# Patient Record
Sex: Female | Born: 1960
Health system: Southern US, Community
[De-identification: ages and names within clinical notes are randomized; demographics above are authoritative.]

## PROBLEM LIST (undated history)

## (undated) DIAGNOSIS — N809 Endometriosis, unspecified: Secondary | ICD-10-CM

## (undated) DIAGNOSIS — R569 Unspecified convulsions: Secondary | ICD-10-CM

## (undated) DIAGNOSIS — Z860101 Personal history of adenomatous and serrated colon polyps: Secondary | ICD-10-CM

## (undated) DIAGNOSIS — E7800 Pure hypercholesterolemia, unspecified: Secondary | ICD-10-CM

## (undated) DIAGNOSIS — E663 Overweight: Secondary | ICD-10-CM

## (undated) DIAGNOSIS — R011 Cardiac murmur, unspecified: Secondary | ICD-10-CM

## (undated) DIAGNOSIS — E039 Hypothyroidism, unspecified: Secondary | ICD-10-CM

## (undated) DIAGNOSIS — G2581 Restless legs syndrome: Secondary | ICD-10-CM

## (undated) DIAGNOSIS — IMO0001 Reserved for inherently not codable concepts without codable children: Secondary | ICD-10-CM

## (undated) DIAGNOSIS — E119 Type 2 diabetes mellitus without complications: Secondary | ICD-10-CM

## (undated) DIAGNOSIS — K589 Irritable bowel syndrome without diarrhea: Secondary | ICD-10-CM

## (undated) DIAGNOSIS — J45909 Unspecified asthma, uncomplicated: Secondary | ICD-10-CM

## (undated) DIAGNOSIS — I639 Cerebral infarction, unspecified: Secondary | ICD-10-CM

## (undated) DIAGNOSIS — Z86010 Personal history of colonic polyps: Secondary | ICD-10-CM

## (undated) DIAGNOSIS — T7840XA Allergy, unspecified, initial encounter: Secondary | ICD-10-CM

## (undated) DIAGNOSIS — F0390 Unspecified dementia without behavioral disturbance: Secondary | ICD-10-CM

## (undated) DIAGNOSIS — D649 Anemia, unspecified: Secondary | ICD-10-CM

## (undated) DIAGNOSIS — I059 Rheumatic mitral valve disease, unspecified: Secondary | ICD-10-CM

## (undated) DIAGNOSIS — F411 Generalized anxiety disorder: Secondary | ICD-10-CM

## (undated) DIAGNOSIS — K219 Gastro-esophageal reflux disease without esophagitis: Secondary | ICD-10-CM

## (undated) DIAGNOSIS — M26629 Arthralgia of temporomandibular joint, unspecified side: Secondary | ICD-10-CM

## (undated) DIAGNOSIS — G4761 Periodic limb movement disorder: Secondary | ICD-10-CM

## (undated) DIAGNOSIS — F3289 Other specified depressive episodes: Secondary | ICD-10-CM

## (undated) DIAGNOSIS — F449 Dissociative and conversion disorder, unspecified: Secondary | ICD-10-CM

## (undated) DIAGNOSIS — R51 Headache: Secondary | ICD-10-CM

## (undated) DIAGNOSIS — F329 Major depressive disorder, single episode, unspecified: Secondary | ICD-10-CM

## (undated) DIAGNOSIS — M1990 Unspecified osteoarthritis, unspecified site: Secondary | ICD-10-CM

## (undated) DIAGNOSIS — R1310 Dysphagia, unspecified: Secondary | ICD-10-CM

## (undated) DIAGNOSIS — M797 Fibromyalgia: Secondary | ICD-10-CM

## (undated) HISTORY — PX: ABDOMINAL HYSTERECTOMY: SHX81

## (undated) HISTORY — PX: LIPOMA EXCISION: SHX5283

## (undated) HISTORY — DX: Cardiac murmur, unspecified: R01.1

## (undated) HISTORY — DX: Endometriosis, unspecified: N80.9

## (undated) HISTORY — PX: WRIST SURGERY: SHX841

## (undated) HISTORY — DX: Periodic limb movement disorder: G47.61

## (undated) HISTORY — PX: TONSILLECTOMY: SUR1361

## (undated) HISTORY — DX: Anemia, unspecified: D64.9

## (undated) HISTORY — DX: Restless legs syndrome: G25.81

## (undated) HISTORY — DX: Allergy, unspecified, initial encounter: T78.40XA

## (undated) HISTORY — DX: Pure hypercholesterolemia, unspecified: E78.00

## (undated) HISTORY — DX: Dissociative and conversion disorder, unspecified: F44.9

## (undated) HISTORY — DX: Headache: R51

## (undated) HISTORY — PX: FOOT SURGERY: SHX648

## (undated) HISTORY — DX: Personal history of adenomatous and serrated colon polyps: Z86.0101

## (undated) HISTORY — DX: Reserved for inherently not codable concepts without codable children: IMO0001

## (undated) HISTORY — PX: OTHER SURGICAL HISTORY: SHX169

## (undated) HISTORY — DX: Rheumatic mitral valve disease, unspecified: I05.9

## (undated) HISTORY — PX: POLYPECTOMY: SHX149

## (undated) HISTORY — DX: Generalized anxiety disorder: F41.1

## (undated) HISTORY — DX: Unspecified dementia, unspecified severity, without behavioral disturbance, psychotic disturbance, mood disturbance, and anxiety: F03.90

## (undated) HISTORY — DX: Overweight: E66.3

## (undated) HISTORY — DX: Cerebral infarction, unspecified: I63.9

## (undated) HISTORY — DX: Fibromyalgia: M79.7

## (undated) HISTORY — DX: Personal history of colonic polyps: Z86.010

## (undated) HISTORY — DX: Unspecified osteoarthritis, unspecified site: M19.90

## (undated) HISTORY — DX: Dysphagia, unspecified: R13.10

## (undated) HISTORY — DX: Other specified depressive episodes: F32.89

## (undated) HISTORY — DX: Irritable bowel syndrome, unspecified: K58.9

## (undated) HISTORY — DX: Gastro-esophageal reflux disease without esophagitis: K21.9

## (undated) HISTORY — DX: Major depressive disorder, single episode, unspecified: F32.9

## (undated) HISTORY — PX: TOTAL ABDOMINAL HYSTERECTOMY: SHX209

## (undated) HISTORY — DX: Arthralgia of temporomandibular joint, unspecified side: M26.629

## (undated) HISTORY — DX: Type 2 diabetes mellitus without complications: E11.9

## (undated) HISTORY — DX: Unspecified asthma, uncomplicated: J45.909

## (undated) HISTORY — DX: Hypothyroidism, unspecified: E03.9

## (undated) HISTORY — DX: Unspecified convulsions: R56.9

---

## 1997-05-19 ENCOUNTER — Other Ambulatory Visit: Admission: RE | Admit: 1997-05-19 | Discharge: 1997-05-19 | Payer: Self-pay | Admitting: *Deleted

## 1997-06-30 ENCOUNTER — Ambulatory Visit (HOSPITAL_BASED_OUTPATIENT_CLINIC_OR_DEPARTMENT_OTHER): Admission: RE | Admit: 1997-06-30 | Discharge: 1997-06-30 | Payer: Self-pay | Admitting: Orthopedic Surgery

## 1997-07-09 ENCOUNTER — Emergency Department (HOSPITAL_COMMUNITY): Admission: EM | Admit: 1997-07-09 | Discharge: 1997-07-09 | Payer: Self-pay | Admitting: Emergency Medicine

## 1997-12-25 ENCOUNTER — Ambulatory Visit (HOSPITAL_COMMUNITY): Admission: RE | Admit: 1997-12-25 | Discharge: 1997-12-25 | Payer: Self-pay | Admitting: General Surgery

## 1998-01-05 ENCOUNTER — Encounter: Payer: Self-pay | Admitting: General Surgery

## 1998-01-05 ENCOUNTER — Ambulatory Visit (HOSPITAL_COMMUNITY): Admission: RE | Admit: 1998-01-05 | Discharge: 1998-01-05 | Payer: Self-pay | Admitting: General Surgery

## 1998-06-24 ENCOUNTER — Other Ambulatory Visit: Admission: RE | Admit: 1998-06-24 | Discharge: 1998-06-24 | Payer: Self-pay | Admitting: *Deleted

## 1999-07-07 ENCOUNTER — Other Ambulatory Visit: Admission: RE | Admit: 1999-07-07 | Discharge: 1999-07-07 | Payer: Self-pay | Admitting: *Deleted

## 1999-09-28 ENCOUNTER — Encounter: Admission: RE | Admit: 1999-09-28 | Discharge: 1999-09-28 | Payer: Self-pay | Admitting: Gastroenterology

## 1999-09-28 ENCOUNTER — Encounter: Payer: Self-pay | Admitting: Gastroenterology

## 1999-10-07 ENCOUNTER — Other Ambulatory Visit: Admission: RE | Admit: 1999-10-07 | Discharge: 1999-10-07 | Payer: Self-pay | Admitting: *Deleted

## 1999-10-07 ENCOUNTER — Encounter (INDEPENDENT_AMBULATORY_CARE_PROVIDER_SITE_OTHER): Payer: Self-pay | Admitting: Specialist

## 2000-05-21 ENCOUNTER — Encounter: Payer: Self-pay | Admitting: Gastroenterology

## 2000-05-21 ENCOUNTER — Ambulatory Visit (HOSPITAL_COMMUNITY): Admission: RE | Admit: 2000-05-21 | Discharge: 2000-05-21 | Payer: Self-pay | Admitting: Gastroenterology

## 2000-06-07 ENCOUNTER — Encounter: Payer: Self-pay | Admitting: *Deleted

## 2000-06-07 ENCOUNTER — Observation Stay (HOSPITAL_COMMUNITY): Admission: RE | Admit: 2000-06-07 | Discharge: 2000-06-08 | Payer: Self-pay | Admitting: *Deleted

## 2000-06-07 ENCOUNTER — Encounter (INDEPENDENT_AMBULATORY_CARE_PROVIDER_SITE_OTHER): Payer: Self-pay | Admitting: Specialist

## 2002-02-28 ENCOUNTER — Ambulatory Visit (HOSPITAL_BASED_OUTPATIENT_CLINIC_OR_DEPARTMENT_OTHER): Admission: RE | Admit: 2002-02-28 | Discharge: 2002-02-28 | Payer: Self-pay | Admitting: Pulmonary Disease

## 2002-11-12 ENCOUNTER — Encounter: Admission: RE | Admit: 2002-11-12 | Discharge: 2002-11-12 | Payer: Self-pay | Admitting: *Deleted

## 2002-11-12 ENCOUNTER — Encounter: Payer: Self-pay | Admitting: *Deleted

## 2003-10-22 ENCOUNTER — Other Ambulatory Visit: Admission: RE | Admit: 2003-10-22 | Discharge: 2003-10-22 | Payer: Self-pay | Admitting: *Deleted

## 2004-01-27 ENCOUNTER — Ambulatory Visit: Payer: Self-pay | Admitting: Adult Health

## 2004-04-04 ENCOUNTER — Emergency Department (HOSPITAL_COMMUNITY): Admission: EM | Admit: 2004-04-04 | Discharge: 2004-04-04 | Payer: Self-pay | Admitting: Emergency Medicine

## 2004-09-06 ENCOUNTER — Emergency Department (HOSPITAL_COMMUNITY): Admission: EM | Admit: 2004-09-06 | Discharge: 2004-09-06 | Payer: Self-pay | Admitting: Family Medicine

## 2004-09-06 ENCOUNTER — Ambulatory Visit: Payer: Self-pay | Admitting: Pulmonary Disease

## 2004-09-21 ENCOUNTER — Ambulatory Visit: Payer: Self-pay | Admitting: Gastroenterology

## 2004-09-22 ENCOUNTER — Ambulatory Visit: Payer: Self-pay | Admitting: Internal Medicine

## 2005-01-13 ENCOUNTER — Ambulatory Visit: Payer: Self-pay | Admitting: Critical Care Medicine

## 2005-01-16 ENCOUNTER — Ambulatory Visit: Payer: Self-pay | Admitting: Pulmonary Disease

## 2005-07-17 ENCOUNTER — Ambulatory Visit: Payer: Self-pay | Admitting: Pulmonary Disease

## 2006-01-08 ENCOUNTER — Ambulatory Visit: Payer: Self-pay | Admitting: Pulmonary Disease

## 2006-11-22 DIAGNOSIS — K219 Gastro-esophageal reflux disease without esophagitis: Secondary | ICD-10-CM | POA: Insufficient documentation

## 2006-11-22 DIAGNOSIS — F418 Other specified anxiety disorders: Secondary | ICD-10-CM | POA: Insufficient documentation

## 2006-11-22 DIAGNOSIS — K589 Irritable bowel syndrome without diarrhea: Secondary | ICD-10-CM | POA: Insufficient documentation

## 2006-11-22 DIAGNOSIS — IMO0001 Reserved for inherently not codable concepts without codable children: Secondary | ICD-10-CM | POA: Insufficient documentation

## 2006-11-22 DIAGNOSIS — E039 Hypothyroidism, unspecified: Secondary | ICD-10-CM | POA: Insufficient documentation

## 2006-11-22 DIAGNOSIS — K7581 Nonalcoholic steatohepatitis (NASH): Secondary | ICD-10-CM | POA: Insufficient documentation

## 2006-11-22 DIAGNOSIS — K760 Fatty (change of) liver, not elsewhere classified: Secondary | ICD-10-CM

## 2007-01-02 ENCOUNTER — Telehealth (INDEPENDENT_AMBULATORY_CARE_PROVIDER_SITE_OTHER): Payer: Self-pay | Admitting: *Deleted

## 2007-01-08 ENCOUNTER — Ambulatory Visit: Payer: Self-pay | Admitting: Pulmonary Disease

## 2007-01-08 LAB — CONVERTED CEMR LAB
ALT: 39 units/L — ABNORMAL HIGH (ref 0–35)
AST: 29 units/L (ref 0–37)
Albumin: 3.9 g/dL (ref 3.5–5.2)
Alkaline Phosphatase: 65 units/L (ref 39–117)
BUN: 5 mg/dL — ABNORMAL LOW (ref 6–23)
Basophils Absolute: 0 10*3/uL (ref 0.0–0.1)
Basophils Relative: 0.5 % (ref 0.0–1.0)
Bilirubin, Direct: 0.1 mg/dL (ref 0.0–0.3)
CO2: 29 meq/L (ref 19–32)
Calcium: 9.4 mg/dL (ref 8.4–10.5)
Chloride: 100 meq/L (ref 96–112)
Creatinine, Ser: 1 mg/dL (ref 0.4–1.2)
Eosinophils Absolute: 0.1 10*3/uL (ref 0.0–0.6)
Eosinophils Relative: 0.8 % (ref 0.0–5.0)
GFR calc Af Amer: 76 mL/min
GFR calc non Af Amer: 63 mL/min
Glucose, Bld: 97 mg/dL (ref 70–99)
HCT: 40.8 % (ref 36.0–46.0)
Hemoglobin: 14.5 g/dL (ref 12.0–15.0)
Lymphocytes Relative: 34.7 % (ref 12.0–46.0)
MCHC: 35.7 g/dL (ref 30.0–36.0)
MCV: 95.6 fL (ref 78.0–100.0)
Monocytes Absolute: 0.3 10*3/uL (ref 0.2–0.7)
Monocytes Relative: 3.5 % (ref 3.0–11.0)
Neutro Abs: 5.8 10*3/uL (ref 1.4–7.7)
Neutrophils Relative %: 60.5 % (ref 43.0–77.0)
Platelets: 239 10*3/uL (ref 150–400)
Potassium: 3.9 meq/L (ref 3.5–5.1)
RBC: 4.26 M/uL (ref 3.87–5.11)
RDW: 12.5 % (ref 11.5–14.6)
Sed Rate: 16 mm/hr (ref 0–25)
Sodium: 138 meq/L (ref 135–145)
TSH: 31.32 microintl units/mL — ABNORMAL HIGH (ref 0.35–5.50)
Total Bilirubin: 0.5 mg/dL (ref 0.3–1.2)
Total Protein: 7.1 g/dL (ref 6.0–8.3)
WBC: 9.5 10*3/uL (ref 4.5–10.5)

## 2007-02-04 ENCOUNTER — Ambulatory Visit: Payer: Self-pay | Admitting: Pulmonary Disease

## 2007-02-14 ENCOUNTER — Telehealth (INDEPENDENT_AMBULATORY_CARE_PROVIDER_SITE_OTHER): Payer: Self-pay | Admitting: *Deleted

## 2007-03-13 ENCOUNTER — Ambulatory Visit: Payer: Self-pay | Admitting: Pulmonary Disease

## 2007-03-13 LAB — CONVERTED CEMR LAB
Free T4: 0.5 ng/dL — ABNORMAL LOW (ref 0.6–1.6)
T3, Free: 2.2 pg/mL — ABNORMAL LOW (ref 2.3–4.2)
TSH: 20.94 microintl units/mL — ABNORMAL HIGH (ref 0.35–5.50)

## 2007-04-17 ENCOUNTER — Encounter: Payer: Self-pay | Admitting: Pulmonary Disease

## 2007-04-25 ENCOUNTER — Telehealth (INDEPENDENT_AMBULATORY_CARE_PROVIDER_SITE_OTHER): Payer: Self-pay | Admitting: *Deleted

## 2007-07-10 ENCOUNTER — Telehealth (INDEPENDENT_AMBULATORY_CARE_PROVIDER_SITE_OTHER): Payer: Self-pay | Admitting: *Deleted

## 2007-07-11 ENCOUNTER — Ambulatory Visit: Payer: Self-pay | Admitting: Internal Medicine

## 2007-07-19 LAB — CONVERTED CEMR LAB: TSH: 7.63 microintl units/mL — ABNORMAL HIGH (ref 0.35–5.50)

## 2007-08-05 ENCOUNTER — Telehealth (INDEPENDENT_AMBULATORY_CARE_PROVIDER_SITE_OTHER): Payer: Self-pay | Admitting: *Deleted

## 2007-08-20 ENCOUNTER — Telehealth: Payer: Self-pay | Admitting: Pulmonary Disease

## 2007-09-04 ENCOUNTER — Ambulatory Visit: Payer: Self-pay | Admitting: Pulmonary Disease

## 2007-09-04 DIAGNOSIS — R1310 Dysphagia, unspecified: Secondary | ICD-10-CM | POA: Insufficient documentation

## 2007-09-04 DIAGNOSIS — F17200 Nicotine dependence, unspecified, uncomplicated: Secondary | ICD-10-CM | POA: Insufficient documentation

## 2007-09-04 LAB — CONVERTED CEMR LAB: Vit D, 1,25-Dihydroxy: 66 (ref 30–89)

## 2007-09-08 DIAGNOSIS — M26629 Arthralgia of temporomandibular joint, unspecified side: Secondary | ICD-10-CM | POA: Insufficient documentation

## 2007-09-08 DIAGNOSIS — R519 Headache, unspecified: Secondary | ICD-10-CM | POA: Insufficient documentation

## 2007-09-08 DIAGNOSIS — G4761 Periodic limb movement disorder: Secondary | ICD-10-CM | POA: Insufficient documentation

## 2007-09-08 DIAGNOSIS — R51 Headache: Secondary | ICD-10-CM | POA: Insufficient documentation

## 2007-09-08 DIAGNOSIS — F411 Generalized anxiety disorder: Secondary | ICD-10-CM | POA: Insufficient documentation

## 2007-09-08 DIAGNOSIS — E785 Hyperlipidemia, unspecified: Secondary | ICD-10-CM | POA: Insufficient documentation

## 2007-09-09 LAB — CONVERTED CEMR LAB
ALT: 65 units/L — ABNORMAL HIGH (ref 0–35)
AST: 45 units/L — ABNORMAL HIGH (ref 0–37)
Albumin: 4.2 g/dL (ref 3.5–5.2)
Alkaline Phosphatase: 70 units/L (ref 39–117)
BUN: 9 mg/dL (ref 6–23)
Basophils Absolute: 0 10*3/uL (ref 0.0–0.1)
Basophils Relative: 0.6 % (ref 0.0–3.0)
Bilirubin, Direct: 0.1 mg/dL (ref 0.0–0.3)
CO2: 29 meq/L (ref 19–32)
Calcium: 9.6 mg/dL (ref 8.4–10.5)
Chloride: 101 meq/L (ref 96–112)
Cholesterol: 301 mg/dL (ref 0–200)
Creatinine, Ser: 0.8 mg/dL (ref 0.4–1.2)
Direct LDL: 211.1 mg/dL
Eosinophils Absolute: 0 10*3/uL (ref 0.0–0.7)
Eosinophils Relative: 0.3 % (ref 0.0–5.0)
GFR calc Af Amer: 98 mL/min
GFR calc non Af Amer: 81 mL/min
Glucose, Bld: 92 mg/dL (ref 70–99)
HCT: 40.9 % (ref 36.0–46.0)
HDL: 32.4 mg/dL — ABNORMAL LOW (ref >39.0)
Hemoglobin: 14.4 g/dL (ref 12.0–15.0)
Lymphocytes Relative: 38.5 % (ref 12.0–46.0)
MCHC: 35.2 g/dL (ref 30.0–36.0)
MCV: 94.6 fL (ref 78.0–100.0)
Monocytes Absolute: 0.4 10*3/uL (ref 0.1–1.0)
Monocytes Relative: 5.1 % (ref 3.0–12.0)
Neutro Abs: 4.3 10*3/uL (ref 1.4–7.7)
Neutrophils Relative %: 55.5 % (ref 43.0–77.0)
Platelets: 224 10*3/uL (ref 150–400)
Potassium: 4.4 meq/L (ref 3.5–5.1)
RBC: 4.32 M/uL (ref 3.87–5.11)
RDW: 11.9 % (ref 11.5–14.6)
Sed Rate: 16 mm/hr (ref 0–22)
Sodium: 138 meq/L (ref 135–145)
TSH: 2.24 microintl units/mL (ref 0.35–5.50)
Total Bilirubin: 0.8 mg/dL (ref 0.3–1.2)
Total CHOL/HDL Ratio: 9.3
Total Protein: 7.2 g/dL (ref 6.0–8.3)
Triglycerides: 284 mg/dL (ref 0–149)
VLDL: 57 mg/dL — ABNORMAL HIGH (ref 0–40)
WBC: 7.7 10*3/uL (ref 4.5–10.5)

## 2007-09-12 ENCOUNTER — Ambulatory Visit: Payer: Self-pay | Admitting: Internal Medicine

## 2007-10-10 ENCOUNTER — Ambulatory Visit: Payer: Self-pay | Admitting: Internal Medicine

## 2007-10-17 ENCOUNTER — Emergency Department (HOSPITAL_COMMUNITY): Admission: EM | Admit: 2007-10-17 | Discharge: 2007-10-17 | Payer: Self-pay | Admitting: Family Medicine

## 2007-10-23 ENCOUNTER — Telehealth: Payer: Self-pay | Admitting: Pulmonary Disease

## 2007-10-23 DIAGNOSIS — M79609 Pain in unspecified limb: Secondary | ICD-10-CM | POA: Insufficient documentation

## 2007-11-11 ENCOUNTER — Ambulatory Visit: Payer: Self-pay | Admitting: Pulmonary Disease

## 2007-11-12 ENCOUNTER — Telehealth (INDEPENDENT_AMBULATORY_CARE_PROVIDER_SITE_OTHER): Payer: Self-pay | Admitting: *Deleted

## 2007-11-27 ENCOUNTER — Encounter: Admission: RE | Admit: 2007-11-27 | Discharge: 2007-11-27 | Payer: Self-pay | Admitting: Pulmonary Disease

## 2008-01-15 ENCOUNTER — Telehealth (INDEPENDENT_AMBULATORY_CARE_PROVIDER_SITE_OTHER): Payer: Self-pay | Admitting: *Deleted

## 2008-01-27 ENCOUNTER — Telehealth: Payer: Self-pay | Admitting: Adult Health

## 2008-01-28 ENCOUNTER — Ambulatory Visit: Payer: Self-pay | Admitting: Pulmonary Disease

## 2008-01-29 ENCOUNTER — Telehealth (INDEPENDENT_AMBULATORY_CARE_PROVIDER_SITE_OTHER): Payer: Self-pay | Admitting: *Deleted

## 2008-02-07 HISTORY — PX: UPPER GASTROINTESTINAL ENDOSCOPY: SHX188

## 2008-02-14 ENCOUNTER — Telehealth: Payer: Self-pay | Admitting: Pulmonary Disease

## 2008-03-12 ENCOUNTER — Telehealth: Payer: Self-pay | Admitting: Pulmonary Disease

## 2008-03-19 ENCOUNTER — Telehealth (INDEPENDENT_AMBULATORY_CARE_PROVIDER_SITE_OTHER): Payer: Self-pay | Admitting: *Deleted

## 2008-04-06 ENCOUNTER — Ambulatory Visit: Payer: Self-pay | Admitting: Internal Medicine

## 2008-04-06 DIAGNOSIS — K625 Hemorrhage of anus and rectum: Secondary | ICD-10-CM | POA: Insufficient documentation

## 2008-04-06 DIAGNOSIS — R197 Diarrhea, unspecified: Secondary | ICD-10-CM | POA: Insufficient documentation

## 2008-04-06 LAB — CONVERTED CEMR LAB
ALT: 64 units/L — ABNORMAL HIGH (ref 0–35)
AST: 48 units/L — ABNORMAL HIGH (ref 0–37)
Albumin: 4.2 g/dL (ref 3.5–5.2)
Alkaline Phosphatase: 72 units/L (ref 39–117)
BUN: 10 mg/dL (ref 6–23)
Basophils Absolute: 0.1 10*3/uL (ref 0.0–0.1)
Basophils Relative: 0.7 % (ref 0.0–3.0)
CO2: 31 meq/L (ref 19–32)
Calcium: 9.5 mg/dL (ref 8.4–10.5)
Chloride: 104 meq/L (ref 96–112)
Creatinine, Ser: 0.7 mg/dL (ref 0.4–1.2)
Eosinophils Absolute: 0 10*3/uL (ref 0.0–0.7)
Eosinophils Relative: 0 % (ref 0.0–5.0)
GFR calc Af Amer: 114 mL/min
GFR calc non Af Amer: 95 mL/min
Glucose, Bld: 78 mg/dL (ref 70–99)
HCT: 40.5 % (ref 36.0–46.0)
Hemoglobin: 14.1 g/dL (ref 12.0–15.0)
Lymphocytes Relative: 44.2 % (ref 12.0–46.0)
MCHC: 34.7 g/dL (ref 30.0–36.0)
MCV: 95.3 fL (ref 78.0–100.0)
Monocytes Absolute: 0.4 10*3/uL (ref 0.1–1.0)
Monocytes Relative: 5.8 % (ref 3.0–12.0)
Neutro Abs: 3.7 10*3/uL (ref 1.4–7.7)
Neutrophils Relative %: 49.3 % (ref 43.0–77.0)
Platelets: 209 10*3/uL (ref 150–400)
Potassium: 3.8 meq/L (ref 3.5–5.1)
RBC: 4.25 M/uL (ref 3.87–5.11)
RDW: 11.7 % (ref 11.5–14.6)
Sed Rate: 19 mm/hr (ref 0–22)
Sodium: 142 meq/L (ref 135–145)
TSH: 0.95 microintl units/mL (ref 0.35–5.50)
Total Bilirubin: 0.8 mg/dL (ref 0.3–1.2)
Total Protein: 7.3 g/dL (ref 6.0–8.3)
WBC: 7.6 10*3/uL (ref 4.5–10.5)

## 2008-04-13 ENCOUNTER — Telehealth: Payer: Self-pay | Admitting: Internal Medicine

## 2008-04-14 ENCOUNTER — Ambulatory Visit: Payer: Self-pay | Admitting: Internal Medicine

## 2008-04-14 ENCOUNTER — Encounter: Payer: Self-pay | Admitting: Internal Medicine

## 2008-04-14 HISTORY — PX: COLONOSCOPY: SHX174

## 2008-04-14 LAB — HM COLONOSCOPY

## 2008-05-11 ENCOUNTER — Telehealth (INDEPENDENT_AMBULATORY_CARE_PROVIDER_SITE_OTHER): Payer: Self-pay | Admitting: *Deleted

## 2008-05-25 ENCOUNTER — Telehealth (INDEPENDENT_AMBULATORY_CARE_PROVIDER_SITE_OTHER): Payer: Self-pay | Admitting: *Deleted

## 2008-06-24 ENCOUNTER — Ambulatory Visit: Payer: Self-pay | Admitting: Internal Medicine

## 2008-06-24 DIAGNOSIS — E663 Overweight: Secondary | ICD-10-CM | POA: Insufficient documentation

## 2008-07-30 ENCOUNTER — Telehealth (INDEPENDENT_AMBULATORY_CARE_PROVIDER_SITE_OTHER): Payer: Self-pay | Admitting: *Deleted

## 2008-08-31 ENCOUNTER — Telehealth (INDEPENDENT_AMBULATORY_CARE_PROVIDER_SITE_OTHER): Payer: Self-pay | Admitting: *Deleted

## 2008-09-02 ENCOUNTER — Telehealth: Payer: Self-pay | Admitting: Pulmonary Disease

## 2008-09-03 ENCOUNTER — Ambulatory Visit: Payer: Self-pay | Admitting: Pulmonary Disease

## 2008-09-08 LAB — CONVERTED CEMR LAB
ALT: 77 units/L — ABNORMAL HIGH (ref 0–35)
AST: 59 units/L — ABNORMAL HIGH (ref 0–37)
Albumin: 4.3 g/dL (ref 3.5–5.2)
Alkaline Phosphatase: 84 units/L (ref 39–117)
BUN: 12 mg/dL (ref 6–23)
Basophils Absolute: 0.1 10*3/uL (ref 0.0–0.1)
Basophils Relative: 1 % (ref 0.0–3.0)
Bilirubin, Direct: 0.1 mg/dL (ref 0.0–0.3)
CO2: 28 meq/L (ref 19–32)
Calcium: 9.9 mg/dL (ref 8.4–10.5)
Chloride: 104 meq/L (ref 96–112)
Cholesterol: 318 mg/dL — ABNORMAL HIGH (ref 0–200)
Creatinine, Ser: 0.8 mg/dL (ref 0.4–1.2)
Direct LDL: 227.6 mg/dL
Eosinophils Absolute: 0 10*3/uL (ref 0.0–0.7)
Eosinophils Relative: 0.3 % (ref 0.0–5.0)
GFR calc non Af Amer: 80.88 mL/min (ref >60)
Glucose, Bld: 114 mg/dL — ABNORMAL HIGH (ref 70–99)
HCT: 43.5 % (ref 36.0–46.0)
HDL: 31.9 mg/dL — ABNORMAL LOW (ref >39.00)
Hemoglobin: 15.1 g/dL — ABNORMAL HIGH (ref 12.0–15.0)
Lymphocytes Relative: 35.6 % (ref 12.0–46.0)
Lymphs Abs: 3 10*3/uL (ref 0.7–4.0)
MCHC: 34.6 g/dL (ref 30.0–36.0)
MCV: 94.7 fL (ref 78.0–100.0)
Monocytes Absolute: 0.3 10*3/uL (ref 0.1–1.0)
Monocytes Relative: 3.4 % (ref 3.0–12.0)
Neutro Abs: 5 10*3/uL (ref 1.4–7.7)
Neutrophils Relative %: 59.7 % (ref 43.0–77.0)
Platelets: 192 10*3/uL (ref 150.0–400.0)
Potassium: 4 meq/L (ref 3.5–5.1)
RBC: 4.6 M/uL (ref 3.87–5.11)
RDW: 12.1 % (ref 11.5–14.6)
Sodium: 139 meq/L (ref 135–145)
TSH: 4.22 microintl units/mL (ref 0.35–5.50)
Total Bilirubin: 1.1 mg/dL (ref 0.3–1.2)
Total CHOL/HDL Ratio: 10
Total Protein: 7.7 g/dL (ref 6.0–8.3)
Triglycerides: 318 mg/dL — ABNORMAL HIGH (ref 0.0–149.0)
VLDL: 63.6 mg/dL — ABNORMAL HIGH (ref 0.0–40.0)
WBC: 8.4 10*3/uL (ref 4.5–10.5)

## 2008-09-22 ENCOUNTER — Telehealth (INDEPENDENT_AMBULATORY_CARE_PROVIDER_SITE_OTHER): Payer: Self-pay | Admitting: *Deleted

## 2008-09-29 ENCOUNTER — Telehealth (INDEPENDENT_AMBULATORY_CARE_PROVIDER_SITE_OTHER): Payer: Self-pay | Admitting: *Deleted

## 2008-09-30 ENCOUNTER — Encounter: Payer: Self-pay | Admitting: Adult Health

## 2008-09-30 ENCOUNTER — Ambulatory Visit: Payer: Self-pay | Admitting: Internal Medicine

## 2008-09-30 ENCOUNTER — Encounter: Payer: Self-pay | Admitting: Cardiovascular Disease

## 2008-10-06 DIAGNOSIS — E785 Hyperlipidemia, unspecified: Secondary | ICD-10-CM | POA: Insufficient documentation

## 2008-10-07 ENCOUNTER — Ambulatory Visit: Payer: Self-pay | Admitting: Cardiovascular Disease

## 2008-10-13 ENCOUNTER — Telehealth (INDEPENDENT_AMBULATORY_CARE_PROVIDER_SITE_OTHER): Payer: Self-pay | Admitting: *Deleted

## 2008-10-14 ENCOUNTER — Encounter: Payer: Self-pay | Admitting: Internal Medicine

## 2008-10-14 ENCOUNTER — Ambulatory Visit: Payer: Self-pay

## 2008-10-19 ENCOUNTER — Telehealth: Payer: Self-pay | Admitting: Internal Medicine

## 2008-10-19 ENCOUNTER — Ambulatory Visit: Payer: Self-pay | Admitting: Internal Medicine

## 2008-10-27 ENCOUNTER — Encounter: Payer: Self-pay | Admitting: Pulmonary Disease

## 2008-11-06 ENCOUNTER — Encounter: Payer: Self-pay | Admitting: Internal Medicine

## 2008-11-06 ENCOUNTER — Ambulatory Visit: Payer: Self-pay | Admitting: Internal Medicine

## 2008-11-26 ENCOUNTER — Telehealth: Payer: Self-pay | Admitting: Internal Medicine

## 2009-01-13 ENCOUNTER — Telehealth: Payer: Self-pay | Admitting: Internal Medicine

## 2009-01-18 ENCOUNTER — Telehealth: Payer: Self-pay | Admitting: Pulmonary Disease

## 2009-02-04 ENCOUNTER — Ambulatory Visit: Payer: Self-pay | Admitting: Pulmonary Disease

## 2009-02-09 ENCOUNTER — Telehealth (INDEPENDENT_AMBULATORY_CARE_PROVIDER_SITE_OTHER): Payer: Self-pay | Admitting: *Deleted

## 2009-02-19 ENCOUNTER — Telehealth: Payer: Self-pay | Admitting: Internal Medicine

## 2009-02-25 ENCOUNTER — Ambulatory Visit: Payer: Self-pay | Admitting: Cardiovascular Disease

## 2009-02-25 ENCOUNTER — Encounter: Payer: Self-pay | Admitting: Cardiovascular Disease

## 2009-02-25 ENCOUNTER — Encounter: Payer: Self-pay | Admitting: Internal Medicine

## 2009-02-25 DIAGNOSIS — R002 Palpitations: Secondary | ICD-10-CM | POA: Insufficient documentation

## 2009-03-02 ENCOUNTER — Telehealth: Payer: Self-pay | Admitting: Pulmonary Disease

## 2009-03-05 ENCOUNTER — Telehealth (INDEPENDENT_AMBULATORY_CARE_PROVIDER_SITE_OTHER): Payer: Self-pay | Admitting: Cardiology

## 2009-03-05 ENCOUNTER — Ambulatory Visit: Payer: Self-pay

## 2009-03-09 ENCOUNTER — Encounter: Payer: Self-pay | Admitting: Internal Medicine

## 2009-03-15 LAB — CONVERTED CEMR LAB
ALT: 50 units/L — ABNORMAL HIGH (ref 0–35)
AST: 42 units/L — ABNORMAL HIGH (ref 0–37)
Albumin: 4.2 g/dL (ref 3.5–5.2)
Alkaline Phosphatase: 75 units/L (ref 39–117)
Bilirubin, Direct: 0 mg/dL (ref 0.0–0.3)
Cholesterol: 283 mg/dL — ABNORMAL HIGH (ref 0–200)
Direct LDL: 212.2 mg/dL
HDL: 32.3 mg/dL — ABNORMAL LOW (ref >39.00)
TSH: 10.95 microintl units/mL — ABNORMAL HIGH (ref 0.35–5.50)
Total Bilirubin: 0.9 mg/dL (ref 0.3–1.2)
Total CHOL/HDL Ratio: 9
Total Protein: 8 g/dL (ref 6.0–8.3)
Triglycerides: 271 mg/dL — ABNORMAL HIGH (ref 0.0–149.0)
VLDL: 54.2 mg/dL — ABNORMAL HIGH (ref 0.0–40.0)

## 2009-03-25 ENCOUNTER — Telehealth: Payer: Self-pay | Admitting: Cardiovascular Disease

## 2009-03-30 ENCOUNTER — Telehealth (INDEPENDENT_AMBULATORY_CARE_PROVIDER_SITE_OTHER): Payer: Self-pay | Admitting: *Deleted

## 2009-04-09 ENCOUNTER — Telehealth (INDEPENDENT_AMBULATORY_CARE_PROVIDER_SITE_OTHER): Payer: Self-pay | Admitting: *Deleted

## 2009-04-15 ENCOUNTER — Telehealth: Payer: Self-pay | Admitting: Pulmonary Disease

## 2009-04-20 ENCOUNTER — Ambulatory Visit: Payer: Self-pay | Admitting: Endocrinology

## 2009-05-21 ENCOUNTER — Telehealth: Payer: Self-pay | Admitting: Internal Medicine

## 2009-06-14 ENCOUNTER — Ambulatory Visit: Payer: Self-pay | Admitting: Pulmonary Disease

## 2009-06-16 ENCOUNTER — Telehealth: Payer: Self-pay | Admitting: Pulmonary Disease

## 2009-06-24 ENCOUNTER — Encounter (INDEPENDENT_AMBULATORY_CARE_PROVIDER_SITE_OTHER): Payer: Self-pay | Admitting: *Deleted

## 2009-07-08 ENCOUNTER — Encounter: Payer: Self-pay | Admitting: Pulmonary Disease

## 2009-08-06 ENCOUNTER — Telehealth: Payer: Self-pay | Admitting: Pulmonary Disease

## 2009-11-03 ENCOUNTER — Telehealth (INDEPENDENT_AMBULATORY_CARE_PROVIDER_SITE_OTHER): Payer: Self-pay | Admitting: *Deleted

## 2009-12-06 ENCOUNTER — Telehealth (INDEPENDENT_AMBULATORY_CARE_PROVIDER_SITE_OTHER): Payer: Self-pay | Admitting: *Deleted

## 2009-12-14 ENCOUNTER — Ambulatory Visit: Payer: Self-pay | Admitting: Pulmonary Disease

## 2010-01-05 ENCOUNTER — Ambulatory Visit: Payer: Self-pay | Admitting: Internal Medicine

## 2010-01-06 ENCOUNTER — Ambulatory Visit: Payer: Self-pay | Admitting: Cardiovascular Disease

## 2010-01-12 LAB — CONVERTED CEMR LAB
ALT: 40 units/L — ABNORMAL HIGH (ref 0–35)
AST: 35 units/L (ref 0–37)
Albumin: 4.1 g/dL (ref 3.5–5.2)
Alkaline Phosphatase: 69 units/L (ref 39–117)
Bilirubin, Direct: 0.1 mg/dL (ref 0.0–0.3)
Cholesterol: 310 mg/dL — ABNORMAL HIGH (ref 0–200)
Direct LDL: 223.7 mg/dL
HDL: 39.5 mg/dL (ref >39.00)
Total Bilirubin: 0.6 mg/dL (ref 0.3–1.2)
Total CHOL/HDL Ratio: 8
Total Protein: 7 g/dL (ref 6.0–8.3)
Triglycerides: 243 mg/dL — ABNORMAL HIGH (ref 0.0–149.0)
VLDL: 48.6 mg/dL — ABNORMAL HIGH (ref 0.0–40.0)

## 2010-02-09 ENCOUNTER — Telehealth: Payer: Self-pay | Admitting: Internal Medicine

## 2010-02-11 ENCOUNTER — Telehealth: Payer: Self-pay | Admitting: Pulmonary Disease

## 2010-02-15 ENCOUNTER — Ambulatory Visit: Admit: 2010-02-15 | Payer: Self-pay

## 2010-02-17 ENCOUNTER — Ambulatory Visit: Admit: 2010-02-17 | Payer: Self-pay

## 2010-02-27 ENCOUNTER — Encounter: Payer: Self-pay | Admitting: *Deleted

## 2010-03-07 ENCOUNTER — Encounter: Payer: Self-pay | Admitting: Internal Medicine

## 2010-03-08 NOTE — Progress Notes (Signed)
Summary: poison ivey exposure  Phone Note Call from Patient Call back at Home Phone [phone]   Caller: Patient Call For: Len Azeez Reason for Call: Talk to Nurse Summary of Call: pt has poison ivy, can you call in a prednisone taper?  Leaving for the beach, can you go ahead and call in? [facility] [place] Initial call taken by: Eugene Gavia,  Jun 16, 2009 9:10 AM  Follow-up for Phone Call        Called and spoke with pt.  She states that she was working in the woods behind her house and was exposed to poison ivey- now has itchy rash on neck, hands and toes.  She is requesting an rx for prednisone.  Please advise thanks Follow-up by: Vernie Murders,  Jun 16, 2009 9:22 AM  Additional Follow-up for Phone Call Additional follow up Details #1::        pt called back, spreading to pt's face now.  She would like something called i before she leaves town today.  Thanks.  Please call her as soon as you can. Additional Follow-up by: Eugene Gavia,  Jun 16, 2009 11:54 AM    Additional Follow-up for Phone Call Additional follow up Details #2::    per SN medrol dose pak take as directed #1. Rx sent. pt aware. Carron Curie CMA  Jun 16, 2009 12:19 PM   New/Updated Medications: MEDROL (PAK) 4 MG TABS (METHYLPREDNISOLONE) as directed Prescriptions: MEDROL (PAK) 4 MG TABS (METHYLPREDNISOLONE) as directed  #1 pak x 0   Entered by:   Carron Curie CMA   Authorized by:   [name] MD   Signed by:   Carron Curie CMA on 06/16/2009   Method used:   Electronically to        [facility] (retail)       [address]       [place], [place]  [address]       Ph: [phone]       Fax: [phone]   RxID:   [id number]

## 2010-03-08 NOTE — Progress Notes (Signed)
Summary: fever blister  Phone Note Call from Patient Call back at [phone]   Caller: Patient Call For: nadel Summary of Call: need something call to pharmacy for fever blister pharmacy [facility] [address] Initial call taken by: Rickard Patience,  January 29, 2008 10:52 AM  Follow-up for Phone Call        what is it you give for fever blisters?  Philipp Deputy [facility]  January 29, 2008 1:02 PM    per SN: pt may have zovirax 5% ointment, apply to fever blister every 3-4 hours as needed.  notified patient of Dr. Voncille Lo, and her script sent to the pharmacy. Follow-up by: Boone Master CNA,  January 29, 2008 3:07 PM    New/Updated Medications: ZOVIRAX 5 % OINT (ACYCLOVIR) apply to fever blister every 3-4 hours   Prescriptions: ZOVIRAX 5 % OINT (ACYCLOVIR) apply to fever blister every 3-4 hours  #1 tube x 1   Entered by:   Boone Master CNA   Authorized by:   [name] MD   Signed by:   Boone Master CNA on 01/29/2008   Method used:   Electronically to        [facility] [phone]* (retail)       [address], [place]  [address]       Ph: [phone] or [phone]       Fax: [phone]   RxID:   [id number]

## 2010-03-08 NOTE — Progress Notes (Signed)
Summary: meds not working    Phone Note Call from Patient Call back at [employer] [phone]   Caller: Patient Summary of Call: Patient states that the carafate and current medsfor reflux she is taking are not working. She would like something eles.  Initial call taken by: Harlow Mares CMA Duncan Dull),  January 13, 2009 3:09 PM  Follow-up for Phone Call        Patient  stopped taking aciphex has switched back to OTC Prevacid and stopped her carafate also.  Patient  says tums, rolaids, etc aren't helping.  I advised her we tried to reach her in Oct and left her 4 voicemails about [facility] referral, patient did not ever return my calls.  I advised her that I will send the referral again and that we will contact her with the appointment date and time once we hear back from Dr Trudi Ida office at [facility] Follow-up by: Darcey Nora RN, CGRN,  January 14, 2009 10:09 AM    Additional Follow-up for Phone Call Additional follow up Details #2::    Notes faxed to [facility] I will notify her of appointment date adn time when I get it from Dr Trudi Ida office Follow-up by: Darcey Nora RN, CGRN,  January 15, 2009 10:02 AM

## 2010-03-08 NOTE — Miscellaneous (Signed)
Summary: [facility] referral  Clinical Lists Changes  Orders: Added new Test order of [facility]) - Signed

## 2010-03-08 NOTE — Progress Notes (Signed)
Summary: awaiting call to start lipitor 10 mg qMon and Fri--samples   Phone Note Outgoing Call   Call placed by: Shelby Dubin PharmD, BCPS, CPP,  March 05, 2009 4:15 PM Call placed to: Patient Summary of Call: [facility] for pt to call..will ask patient to start taking lipitor 10 mg by mouth on MF each week.  Samples to desk.   Initial call taken by: Shelby Dubin PharmD, BCPS, CPP,  March 05, 2009 4:16 PM  Follow-up for Phone Call        [facility] for pt. to call office for samples.  Follow-up by: Bethena Midget, RN, BSN,  March 10, 2009 12:59 PM  Additional Follow-up for Phone Call Additional follow up Details #1::        Pt came to office and reviewed start of lipitor with patient.   Additional Follow-up by: Shelby Dubin PharmD, BCPS, CPP,  March 10, 2009 3:13 PM

## 2010-03-08 NOTE — Assessment & Plan Note (Signed)
Summary: GERD, GLOBUS    History of Present Illness Visit Type: Follow-up Visit Primary GI MD: Silvano Rusk MD [facility] Primary Provider: Teressa Lower, MD Chief Complaint: Consult for EGD per ENT, "something abnormal behind her vocal cords" History of Present Illness:   She saw ENT today due to sore throat and globus. She was sent there by urgent care. She has been having pill dysphagia in left neck x months. No dysphagia to food and no odynophagia. She has persistent heartburn despite two times a day Prevacid and says Nexium and Protonix have been unhelpful in past. Prilosec may have caused diarrhea. She continues to smoke and drink caffeine. At laryngoscopy today she had post-glottic erythema thought to represent GERD. She also was treated with Augmentin due to halitosis and some ? of cryptic tonsillitis (s/p tonsillectomy). She says her adominal pain and IBS problems are much better at this time (seen for these and had colonoscopy 06/2008).   GI Review of Systems    Reports acid reflux, belching, bloating, dysphagia with solids, heartburn, and  nausea.       Reports change in bowel habits, constipation, and  diarrhea.      Current Medications (verified): 1)  Synthroid 200 Mcg  Tabs (Levothyroxine Sodium) .Marland Kitchen.. 1 By Mouth Once Daily 2)  Prevacid 30 Mg  Cpdr (Lansoprazole) .... Take One By Mouth Two Times A Day 3)  Wellbutrin Xl 300 Mg  Tb24 (Bupropion Hcl) .... Take 1 Tablet By Mouth Once A Day 4)  Celexa 40 Mg  Tabs (Citalopram Hydrobromide) .... Take 1 Tablet By Mouth Once A Day 5)  Alprazolam 0.5 Mg  Tabs (Alprazolam) .... As Needed Four Times A Day  Not To Exceed 4 Tabs Per Day and No Early Refills 6)  Skelaxin 800 Mg  Tabs (Metaxalone) .Marland Kitchen.. 1 By Mouth Three Times A Day As Needed 7)  Vicodin 5-500 Mg  Tabs (Hydrocodone-Acetaminophen) .Marland Kitchen.. 1 By Mouth Every 6-8 Hours As Needed  No Early Refiils  Do Not Exceed 3  Tabs Per Day 8)  Mobic 15 Mg Tabs (Meloxicam) .Marland Kitchen.. 1 By Mouth Once Daily As  Needed Joint Pain 9)  Zovirax 5 % Oint (Acyclovir) .... Apply To Fever Blister Every 3-4 Hours 10)  Zocor 20 Mg Tabs (Simvastatin) .... Take 1 Tab By Mouth At Bedtime.( Not Taking Daily) 11)  Rolaids 334 Mg Chew (Dihydroxyaluminum Sod Carb) .... Take As Needed 12)  Tums 500 Mg Chew (Calcium Carbonate Antacid) .... Take As Needed 13)  Maalox 600 Mg Chew (Calcium Carbonate Antacid) .... Take As Needed  Allergies (verified): No Known Drug Allergies  Past History:  Past Medical History: Last updated: 09/03/2008 Hx of TMJ PAIN (ICD-524.62) CIGARETTE SMOKER (ICD-305.1) - she continues to smoke 1ppd... MITRAL VALVE PROLAPSE (ICD-424.0)  ~  2DEcho 10/06 was normal without MVP seen, no MR, norm LVF... prev 2D in 1990 w/ mild bowing of ant leaflet c/w mild MVP at that time...  ~  NuclearStressTest 2/02 was WNL- no ischemia, no infarction, EF=73%... HYPERCHOLESTEROLEMIA (ICD-272.0) - hx of Chol values >300 in the past (esp when thyroid was underactive)... she has been inconsistant w/ f/u labs... HYPOTHYROIDISM (ICD-244.9) - on SYNTHROID 274mg/d... hypothyroidism initially diagnosed in the 1980's and she's been on meds ever since then...   ~  labs 12/08 showed TSH= 31 ? off her meds... rec to restart her Synthroid regularly... GERD (ICD-530.81) & DYSPHAGIA UNSPECIFIED (ICD-787.20) - prev GI eval by DrSamLeBauer-  IBS (ICD-564.1) - colonoscopy 3/93 by DrSam was WNL...symptoms  were felt to be IBS...  ~  CTAbd 8/06 showed fatty liver, neg abd, s/p hyst, left ovarian cyst, norm right ovary... LIVER FUNCTION TESTS, ABNORMAL (ICD-794.8) - she had second GI opinion and eval of Abn LFT's by DrBuccini in 2002... he felt she had Fatty Liver disease... -Chronic Headache- prev eval w/ neuro, Dr. Ron Parker.        Past Surgical History: Last updated: 04/06/2008 S/P ELap w/ lysis of adhesions & excision of hydatid cyst left tube 1992 S/P supracervical hysterectomy  by DrFore in 2002 Tonsillectomy Arm  Surgery-right Wrist surgery-right Foot surgery-Left  Family History: Reviewed history from 06/24/2008 and no changes required. mother alive age 45  hx of chol. and heart problems father alive age 95  hx of DM and heart problems 1 brother alive age 9 1 sister alive age 80 1 sister alive age 61 Family History of Colon Cancer: Paternal Aunt Family History of Breast Cancer: Maternal Aunt x 2 Family History of Stomach Cancer: Paternal Aunt, maternal grandmother Family History of Diabetes: Father, aunt x 2, uncles x 2 Family History of Heart Disease: 72, Father, Aunt, Grandfather, Grandmother (paternal and maternal) Family History of Kidney Disease: cousin  Social History: disabled smoker -1 ppd married 2 children Alcohol Use - yes-occasionally Daily Caffeine Use-5 glasses a day Illicit Drug Use - no Patient does not get regular exercise.   Review of Systems       See HPI  Vital Signs:  Patient profile:   50 year old female Height:      67 inches Weight:      184 pounds BMI:     28.92 Pulse rate:   76 / minute Pulse rhythm:   regular BP sitting:   122 / 66  (right arm) Cuff size:   regular  Vitals Entered By: Bernita Buffy CMA ([place]) (October 19, 2008 3:13 PM)  Physical Exam  General:  overweight, NAD Eyes:  anicteric Mouth:  No deformity or lesions, Neck:  Supple; no masses or thyromegaly. Lungs:  Clear throughout to auscultation. Heart:  Regular rate and rhythm; no murmurs, rubs,  or bruits. Extremities:  no edema Neurologic:  Alert and  oriented x4;  Cervical Nodes:  No significant cervical or supraclavicular adenopathy.  Psych:  slightly flat affect  Old chart, ENT notes and laryngoscopy report reviewed.  Impression & Recommendations:  Problem # 1:  GERD (ICD-530.81) Assessment Deteriorated She carries this diagnosis. She has intractable heartburn despite two times a day Prevacid. Has failed Nexium, Prilosec, Protonix it seems. ENT evaluation  has shown posterior glottic erythema that is associated with but certainly not diagnsotic of GERD. She continues to smoke and use caffeine.  I suspect she probably has functional heartburn and/or sensitive esophagus. It is possible (but unlikely) that this is GERD resistant to PPI's. Egd will be useful to see if there is mucosal abnormality.  Will try Aciphex 20 mg two times a day samples to see if that is effective.   Risks, benefits,and indications of endoscopic procedure(s) were reviewed with the patient and all questions answered.  Orders: EGD (EGD)  Problem # 2:  GLOBUS (HKV-425.95) Assessment: Unchanged She had this previously. May well be anxiety-related though GERD is in the differential as a cause.  Patient Instructions: 1)  We will see you at your procedure on 11/06/08. 2)  Begin taking AcipHex twice daily (30 minutes before breakfast and supper) instead of the Prevacid.   3)  [place] Patient Information Guide given to  patient.  4)  Copy sent to : Lilia Pro, [facility] 5)  The medication list was reviewed and reconciled.  All changed / newly prescribed medications were explained.  A complete medication list was provided to the patient / caregiver. Prescriptions: ACIPHEX 20 MG TBEC (RABEPRAZOLE SODIUM) Take 1 tablet by mouth two times a day  #60 x 2   Entered by:   Abelino Derrick CMA ([place])   Authorized by:   Gatha Mayer MD, [facility]   Signed by:   Abelino Derrick CMA ([place]) on 10/19/2008   Method used:   Electronically to        [id number] (retail)       [place], [place]  [address]       Ph: [phone]       Fax: [phone]   RxID:   [id number]

## 2010-03-08 NOTE — Assessment & Plan Note (Signed)
Summary: np3/chest pain/jml      Allergies Added: NKDA  Primary Provider:  Teressa Lower, MD  CC:  last week pt seen Dr.Nadel because of chest pains around left breast. Severity of pain 8.  History of Present Illness: Tasha [name] is a new patient referred by Dr. Lenna Gilford for SSCP.  The pain is atypical.  She has had it for a month.  It can last hours and is not always exertional.  She has significant fibromylalgia.  Nothing she does improves the pain.  There is no radiatin to the arms but sometimes she can just ache all over.  Despit 1ppd smoking she does not cough.  Her foot pain limits her activity before she notes any marked SoB. She had a normal myovue in 2002.  There is a history of MVP but this was not seen on previous echos. She also complains about palpitations.  They are weekly with fliip flopping no prolonged sustained tachycardia.  She feels her heart stops beating for a perirod of time but there has been no syncpe    Current Problems (verified): 1)  Hyperlipidemia  (ICD-272.4) 2)  Chest Pain  (ICD-786.50) 3)  Obesity, Mild  (ICD-278.02) 4)  Rectal Bleeding  (ICD-569.3) 5)  Diarrhea  (ICD-787.91) 6)  Abdominal Pain-lower  (ICD-789.09) 7)  Arthralgia  (ICD-719.40) 8)  Leg Pain  (ICD-729.5) 9)  Hx of Tmj Pain  (ICD-524.62) 10)  Mitral Valve Prolapse  (ICD-424.0) 11)  Hypercholesterolemia  (ICD-272.0) 12)  Hypothyroidism  (ICD-244.9) 13)  Gerd  (ICD-530.81) 14)  Dysphagia Unspecified  (ICD-787.20) 15)  Ibs  (ICD-564.1) 16)  Liver Function Tests, Abnormal  (ICD-794.8) 17)  Fibromyalgia  (ICD-729.1) 18)  Headache, Chronic  (ICD-784.0) 19)  Periodic Limb Movement Disorder  (ICD-327.51) 20)  Anxiety  (ICD-300.00) 21)  Depression  (ICD-311) 22)  Cigarette Smoker  (ICD-305.1)  Current Medications (verified): 1)  Synthroid 200 Mcg  Tabs (Levothyroxine Sodium) .Marland Kitchen.. 1 By Mouth Once Daily 2)  Prevacid 30 Mg  Cpdr (Lansoprazole) .... Take 1 Tablet By Mouth Once A Day 3)  Wellbutrin Xl 300  Mg  Tb24 (Bupropion Hcl) .... Take 1 Tablet By Mouth Once A Day 4)  Celexa 40 Mg  Tabs (Citalopram Hydrobromide) .... Take 1 Tablet By Mouth Once A Day 5)  Alprazolam 0.5 Mg  Tabs (Alprazolam) .... As Needed Four Times A Day  Not To Exceed 4 Tabs Per Day and No Early Refills 6)  Skelaxin 800 Mg  Tabs (Metaxalone) .Marland Kitchen.. 1 By Mouth Three Times A Day As Needed 7)  Vicodin 5-500 Mg  Tabs (Hydrocodone-Acetaminophen) .Marland Kitchen.. 1 By Mouth Every 6-8 Hours As Needed  No Early Refiils  Do Not Exceed 3  Tabs Per Day 8)  Mobic 15 Mg Tabs (Meloxicam) .Marland Kitchen.. 1 By Mouth Once Daily As Needed Joint Pain 9)  Zovirax 5 % Oint (Acyclovir) .... Apply To Fever Blister Every 3-4 Hours 10)  Zocor 20 Mg Tabs (Simvastatin) .... Take 1 Tab By Mouth At Bedtime.  Return For Office Visit and Fasting Labs Week of 10-19-08.  Allergies (verified): No Known Drug Allergies  Past History:  Past Medical History: Last updated: 09/03/2008 Hx of TMJ PAIN (ICD-524.62) CIGARETTE SMOKER (ICD-305.1) - she continues to smoke 1ppd... MITRAL VALVE PROLAPSE (ICD-424.0)  ~  2DEcho 10/06 was normal without MVP seen, no MR, norm LVF... prev 2D in 1990 w/ mild bowing of ant leaflet c/w mild MVP at that time...  ~  NuclearStressTest 2/02 was WNL- no ischemia, no infarction, EF=73%.Marland KitchenMarland Kitchen  HYPERCHOLESTEROLEMIA (ICD-272.0) - hx of Chol values >300 in the past (esp when thyroid was underactive)... she has been inconsistant w/ f/u labs... HYPOTHYROIDISM (ICD-244.9) - on SYNTHROID 265mg/d... hypothyroidism initially diagnosed in the 1980's and she's been on meds ever since then...   ~  labs 12/08 showed TSH= 31 ? off her meds... rec to restart her Synthroid regularly... GERD (ICD-530.81) & DYSPHAGIA UNSPECIFIED (ICD-787.20) - prev GI eval by DrSamLeBauer-  IBS (ICD-564.1) - colonoscopy 3/93 by DrSam was WNL...symptoms were felt to be IBS...  ~  CTAbd 8/06 showed fatty liver, neg abd, s/p hyst, left ovarian cyst, norm right ovary... LIVER FUNCTION TESTS,  ABNORMAL (ICD-794.8) - she had second GI opinion and eval of Abn LFT's by DrBuccini in 2002... he felt she had Fatty Liver disease... -Chronic Headache- prev eval w/ neuro, Dr. WRon Parker        Past Surgical History: Last updated: 04/06/2008 S/P ELap w/ lysis of adhesions & excision of hydatid cyst left tube 1992 S/P supracervical hysterectomy  by DrFore in 2002 Tonsillectomy Arm Surgery-right Wrist surgery-right Foot surgery-Left  Family History: Last updated: 06/24/2008 mother alive age 30221 hx of chol. and heart problems father alive age 51 hx of DM and heart problems 1 brother alive age 51 1sister alive age 7[address]19 1sister alive age 70394Family History of Colon Cancer: Paternal Aunt Family History of Breast Cancer: Maternal Aunt x 2 Family History of Stomach Cancer: Paternal Aunt, maternal grandmother Family History of Diabetes: Father, aunt x 2, uncles x 2 Family History of Heart Disease: G23 Father, Aunt, GJon Gills Grandmother (paternal and maternal) Family History of Kidney Disease: cousin  Social History: Last updated: 04/06/2008 disabled smoker -1 ppd married 2 children Alcohol Use - yes-occasionally Daily Caffeine UBUL-84-53cups per day Illicit Drug Use - no Patient does not get regular exercise.   Review of Systems       Denies fever, malais, weight loss, blurry vision, decreased visual acuity, cough, sputum, SOB, hemoptysis, pleuritic pain, s, heartburn, abdominal pain, melena, lower extremity edema, claudication, or rash. All other systems reviewed and negative except as noted in HPI  Vital Signs:  Patient profile:   50year old female Height:      67 inches Weight:      185 pounds BMI:     29.08 Pulse rate:   73 / minute Resp:     12 per minute BP sitting:   122 / 64  (left arm)  Vitals Entered By: KBurnett Kanaris(October 07, 2008 4:32 PM)  Physical Exam  General:  Affect appropriate Healthy:  appears stated age H55 normal Neck  supple with no adenopathy JVP normal no bruits no thyromegaly Lungs clear with no wheezing and good diaphragmatic motion Heart:  S1/S2 no murmur,rub, gallop or click PMI normal Abdomen: benighn, BS positve, no tenderness, no AAA no bruit.  No HSM or HJR Distal pulses intact with no bruits No edema Neuro non-focal Skin warm and dry    Impression & Recommendations:  Problem # 1:  CHEST PAIN (ICD-786.50) Atypical  cannot walk due to fibromyalgia.  Non-specific ECG changes.  F/U myovue Orders: T-2 View CXR (71020TC) Nuclear Stress Test (Nuc Stress Test)  Problem # 2:  HYPERLIPIDEMIA (IMIW-8034) Continue diet therapy and statin. Her updated medication list for this problem includes:    Zocor 20 Mg Tabs (Simvastatin) ..Marland Kitchen.. Take 1 tab by mouth at bedtime.  return for office visit and fasting labs week of 10-19-08.  CHOL:  318 (09/03/2008)   LDL: DEL (09/04/2007)   HDL: 31.90 (09/03/2008)   TG: 318.0 (09/03/2008)  Problem # 3:  MITRAL VALVE PROLAPSE (ICD-424.0) No murmur on exam no need for F/U echo  Problem # 4:  CIGARETTE SMOKER (ICD-305.1) On Welbutrin with no help.  Not interested in Chantix. Low motivation to quit.  Husband also smokes Counseled for less than 10 minutes.  Check CXR not had one in over a year   Echocardiogram Report  Procedure date:  09/30/2008  Findings:      NSR 63 Non-specific ST/T wave changes Borderline ECG

## 2010-03-08 NOTE — Progress Notes (Signed)
Summary: JAW PAIN  Phone Note Call from Patient Call back at [phone]   Caller: Patient Call For: NADEL Summary of Call: PT HAVING JAW PAIN  NEED TO TALK TO NURSE Initial call taken by: Rickard Patience,  July 10, 2007 2:15 PM  Follow-up for Phone Call        Patient scheduled to see TP on 07-11-07. Follow-up by: Michel Bickers CMA,  July 10, 2007 2:27 PM

## 2010-03-08 NOTE — Assessment & Plan Note (Signed)
Summary: 4-6 weeks/apc   Chief Complaint:  7-8 month ROV....  History of Present Illness: 50 y/o WF here for a follow up visit... she has multiple medical problems as noted below... since I saw her last in Dec08 she has persisted w/ her complaints of fatigue, aches, pains, and soreness all over- characteristic of her Fibromyalgia... she was prev eval by DrDeveshwar for Rheum but hasn't had a follow up in some time and she has stopped the Flexeril med... she continues to smoke 1ppd and isn't interested in smoking cessation help... she states that she's depressed and has prev seen a psychiatrist but can't remember who it was... she has mult somatic complaints and notes dysphagia and wants a GI referral to evaluate this further...     Current Problem List:  Hx of TMJ PAIN (ICD-524.62)  CIGARETTE SMOKER (ICD-305.1) - she continues to smoke 1ppd...  MITRAL VALVE PROLAPSE (ICD-424.0)  ~  2DEcho 10/06 was normal without MVP seen, no MR, norm LVF... prev 2D in 1990 w/ mild bowing of ant leaflet c/w mild MVP at that time...  ~  NuclearStressTest 2/02 was WNL- no ischemia, no infarction, EF=73%...  HYPERCHOLESTEROLEMIA (ICD-272.0) - hx of Chol values >300 in the past (esp when thyroid was underactive)... she has been inconsistant w/ f/u labs...  HYPOTHYROIDISM (ICD-244.9) - on SYNTHROID 237mcg/d... hypothyroidism initially diagnosed in the 1980's and she's been on meds ever since then...   ~  labs 12/08 showed TSH= 31 ? off her meds... rec to restart her Synthroid regularly...  GERD (ICD-530.81) & DYSPHAGIA UNSPECIFIED (ICD-787.20) - prev GI eval by DrSamLeBauer-   IBS (ICD-564.1) - colonoscopy 3/93 by DrSam was WNL...symptoms were felt to be IBS...  ~  CTAbd 8/06 showed fatty liver, neg abd, s/p hyst, left ovarian cyst, norm right ovary...  LIVER FUNCTION TESTS, ABNORMAL (ICD-794.8) - she had second GI opinion and eval of Abn LFT's by DrBuccini in 2002... he felt she had Fatty Liver  disease...  FIBROMYALGIA (ICD-729.1) - currently on SKELAXIN 800mg Tid as needed... over the years she has seen DrTruslow, DrZeminski, & DrDeveshwar for Rheum opinions... she has also been treated by DrWillen/chiropractor... she has tried Flexeril, DCN100, Vicodin, now Skelaxin...   HEADACHE, CHRONIC (ICD-784.0) - on MIDRIN Prn,  & VICODIN limited to 3/d no early refills... she has been eval by Dr Meryl Crutch & DrWeymann for Neurology- they rec the Wellbutrin & Celexa... prev Neuro [place] 1[place] w/ DrHaworth for c/o numbness & tingling- (***of note he recorded that her ROS was pos for every question that he asked her***)... he rec MRI Brain, NCV's, and extensive lab work- we never recieved a follow up note after the initial visit... prev MRI from Neuro in [place] was WNL in the 1990's...  PERIODIC LIMB MOVEMENT DISORDER (ICD-327.51) - she had a prev sleep study 1/04 w/ signif periodic limb movements (leg jerks) w/ sleep disruption (study showed RDI= 1, but PLM index was 11- mostly arousals)... started on MIRAPEX 0.25mg - 2 Qhs...  ANXIETY (ICD-300.00) - on ALPRAZOLAM 0.5mg Tid as needed.  DEPRESSION (ICD-311) - on WELLBUTRIN XL 300mg /d,  & CELEXA 40mg /d... she has seen a psychiatrist prev but she can't remember who it was...      Current Allergies (reviewed today): No known allergies   Past Medical History:        Hx of TMJ PAIN (ICD-524.62)    CIGARETTE SMOKER (ICD-305.1)    MITRAL VALVE PROLAPSE (ICD-424.0)    HYPERCHOLESTEROLEMIA (ICD-272.0)    HYPOTHYROIDISM (ICD-244.9)  GERD (ICD-530.81)    DYSPHAGIA UNSPECIFIED (ICD-787.20)    IBS (ICD-564.1)    LIVER FUNCTION TESTS, ABNORMAL (ICD-794.8)    FIBROMYALGIA (ICD-729.1)    HEADACHE, CHRONIC (ICD-784.0)    PERIODIC LIMB MOVEMENT DISORDER (ICD-327.51)    ANXIETY (ICD-300.00)    DEPRESSION (ICD-311)      Past Surgical History:    S/P ELap w/ lysis of adhesions & excision of hydatid cyst left tube 1992    S/P supracervical  hysterectomy  by DrFore in 2002   Family History:    mother alive age 50  hx of chol. and heart problems    father alive age 36  hx of DM and heart problems    1 brother alive age 59    1 sister alive age 86    1 sister alive age 61  Social History:    disabled    smoker  1ppd    married    2 children    Review of Systems       The patient complains of dyspnea on exertion, headaches, abdominal pain, severe indigestion/heartburn, difficulty walking, and depression.  The patient denies anorexia, fever, weight loss, weight gain, vision loss, decreased hearing, hoarseness, chest pain, syncope, peripheral edema, prolonged cough, hemoptysis, melena, hematochezia, hematuria, incontinence, muscle weakness, suspicious skin lesions, transient blindness, unusual weight change, abnormal bleeding, enlarged lymph nodes, and angioedema.     Vital Signs:  Patient Profile:   50 Years Old Female Height:     67 inches Weight:      180.50 pounds O2 Sat:      96 % O2 treatment:    Room Air Temp:     97.1 degrees F oral Pulse rate:   61 / minute BP sitting:   128 / 70  (right arm)  Vitals Entered By: Marijo File CMA (September 04, 2007 12:15 PM)                 Physical Exam  WD, WN, 50 y/o WF in NAD... GENERAL:  Alert, oriented & cooperative... she has 4+ trigger points all over on exam... HEENT:  [place]/AT, EOM-wnl, PERRLA, Fundi-benign, EACs-clear, TMs-wnl, NOSE-clear, THROAT-clear & wnl. NECK:  Supple w/ full ROM; no JVD; normal carotid impulses w/o bruits; no thyromegaly or nodules palpated; no lymphadenopathy. CHEST:  Clear to P & A; without wheezes/ rales/ or rhonchi. HEART:  Regular Rhythm; without murmurs/ rubs/ or gallops. ABDOMEN:  Soft & nontender; normal bowel sounds; no organomegaly or masses detected. EXT: without deformities or arthritic changes; no varicose veins/ venous insuffic/ or edema. NEURO:  CN's intact; no focal neuro deficits... DERM:  No lesions noted; no rash  etc...        Impression & Recommendations:  Problem # 1:  CIGARETTE SMOKER (ICD-305.1) We discussed smoking cessation strategies including cessation programs, counselling, nicotine replacement, and Chantix receptor blockade... the pt is not interested at this time but we left the door open should she like to reconsider at any time.  Orders: T-2 View CXR, Same Day (71020.5TC)   Problem # 2:  MITRAL VALVE PROLAPSE (ICD-424.0) No signif problem... no meds or SBE needed...  Orders: T-2 View CXR, Same Day (71020.5TC) Venipuncture (14782) T-Vitamin D (25-Hydroxy) (95621-30865) TLB-Lipid Panel (80061-LIPID) TLB-BMP (Basic Metabolic Panel-BMET) (80048-METABOL) TLB-CBC Platelet - w/Differential (85025-CBCD) TLB-Hepatic/Liver Function Pnl (80076-HEPATIC) TLB-TSH (Thyroid Stimulating Hormone) (84443-TSH) TLB-Sedimentation Rate (ESR) (85651-ESR)   Problem # 3:  HYPERCHOLESTEROLEMIA (ICD-272.0) She needs FLP, and poss referral to the Lipid Clinic...  Problem #  4:  HYPOTHYROIDISM (ICD-244.9) F/u labs on Synthroid... Her updated medication list for this problem includes:    Synthroid 200 Mcg Tabs (Levothyroxine sodium) .Marland Kitchen... 1 by mouth once daily   Problem # 5:  DYSPHAGIA UNSPECIFIED (ICD-787.20) She is c/o dysphagia and wants GI referral for further eval... Orders: Gastroenterology Referral (GI)   Problem # 6:  LIVER FUNCTION TESTS, ABNORMAL (ICD-794.8) Hx fatty liver disease and we discussed diet + exercise w/ the goal of losing weight...  Problem # 7:  FIBROMYALGIA (ICD-729.1) Very severe problem and she refuses to see Rheum again... we discussed eval at the [facility] (DrWanek or DrWatkins) and [employer] referral to [facility] in [place]... she will decide. Her updated medication list for this problem includes:    Skelaxin 800 Mg Tabs (Metaxalone) .Marland Kitchen... 1 by mouth three times a day as needed    Vicodin 5-500 Mg Tabs (Hydrocodone-acetaminophen) .Marland Kitchen... 1 by mouth every 6-8 hours  as needed  no early refiils  do not exceed 3  tabs per day   Problem # 8:  DEPRESSION (ICD-311) She is on too many drugs already and still c/o depression- she needs psychiatric eval and Rx... Her updated medication list for this problem includes:    Wellbutrin Xl 300 Mg Tb24 (Bupropion hcl) .Marland Kitchen... Take 1 tablet by mouth once a day    Celexa 40 Mg Tabs (Citalopram hydrobromide) .Marland Kitchen... Take 1 tablet by mouth once a day    Alprazolam 0.5 Mg Tabs (Alprazolam) .Marland Kitchen... As needed four times a day   Complete Medication List: 1)  Synthroid 200 Mcg Tabs (Levothyroxine sodium) .Marland Kitchen.. 1 by mouth once daily 2)  Prevacid 30 Mg Cpdr (Lansoprazole) .... Take 1 tablet by mouth once a day 3)  Wellbutrin Xl 300 Mg Tb24 (Bupropion hcl) .... Take 1 tablet by mouth once a day 4)  Celexa 40 Mg Tabs (Citalopram hydrobromide) .... Take 1 tablet by mouth once a day 5)  Alprazolam 0.5 Mg Tabs (Alprazolam) .... As needed four times a day 6)  Skelaxin 800 Mg Tabs (Metaxalone) .Marland Kitchen.. 1 by mouth three times a day as needed 7)  Mirapex 0.125 Mg Tabs (Pramipexole dihydrochloride) .... Take 2 tablets daily 8)  Midrin 325-65-100 Mg Caps (Apap-isometheptene-dichloral) .... Once daily as needed headache 9)  Vicodin 5-500 Mg Tabs (Hydrocodone-acetaminophen) .Marland Kitchen.. 1 by mouth every 6-8 hours as needed  no early refiils  do not exceed 3  tabs per day   Patient Instructions: 1)  Today we updated your med list- see below.... 2)  Continue your current medications for now...  3)  We will arrange for you to see DrGessner/ GI for eval of your swallowing difficulty.Marland KitchenMarland Kitchen 4)  We will set up an appt per your request w/ an Endocrinologist to eval your thyroid condition.Marland KitchenMarland Kitchen 5)  We will also set up an appt w/ a new psychiatrist that is on your plan- for treatment options regarding your depression.Marland KitchenMarland Kitchen 6)  You may also wish tp look into alternative medicine treatment of your Fibromyalgia and Chronic Fatigue... the [facility] here in [place] has a  good reputation (Dr Orie Rout, or Dr Monico Hoar)... you could also consider [place] most famous Fibro doctor- Dr Silvana Newness in [place]... 7)  In the meanwhile- we did follow up CXR and blood work today... please call the "phone tree" in a few days for your lab results...   ]

## 2010-03-08 NOTE — Progress Notes (Signed)
Summary: f/u w/ labs?  Phone Note Call from Patient   Caller: Patient Call For: Tasha [name] Summary of Call: pt seeing tp for 6 months f/u. will she need labs? pt wasn't sure.  Initial call taken by: Tivis Ringer,  January 27, 2008 1:00 PM  Follow-up for Phone Call        Please advise if pt needing labwork done. Michel Bickers [facility]  January 27, 2008 3:38 PM  Additional Follow-up for Phone Call Additional follow up Details #1::        yes , she may need labs. will determine at ov.  ? who is monitoring her cholesterol may need fasing lipid.  Additional Follow-up by: Rubye Oaks NP,  January 28, 2008 12:10 AM    Additional Follow-up for Phone Call Additional follow up Details #2::    Attempted TCB.  someone picked up phone, but never spoke. Cloyde Reams RN  January 28, 2008 8:45 AM   Pt has appt with TP today at 11:30a.  Attempted TCB pt again NA.  will advise at OV. Follow-up by: Cloyde Reams RN,  January 28, 2008 10:14 AM

## 2010-03-08 NOTE — Letter (Signed)
Summary: [facility]  [facility]   Imported By: Lester Sinclair 10/21/2008 10:20:20  _____________________________________________________________________  External Attachment:    Type:   Image     Comment:   External Document

## 2010-03-08 NOTE — Progress Notes (Signed)
Summary: appt in cardio/med for congestion  Phone Note Call from Patient Call back at Home Phone [phone]   Caller: Patient Call For: nadel Reason for Call: Talk to Nurse Summary of Call: pt called Cardiology and told them SN wanted her to wear a heart monitor for a month and they said our office would need to call and get the pt an appointment. Initial call taken by: Eugene Gavia,  February 09, 2009 12:00 PM  Follow-up for Phone Call        pt last saw SN 02-04-2009 and I don't see anywhere in  note where he wanted pt to have a heart monitor.  Please advise if this is something you want ordered for pt  (please note: pt does see Dr. Eden Emms)  Arman Filter LPN  February 09, 2009 12:15 PM     Additional Follow-up for Phone Call Additional follow up Details #1::        we will send an order for her to have an appt with Dr. Shirleen Schirmer let her know this---the order has already been put in.  thanks Randell Loop CMA  February 10, 2009 8:50 AM     Additional Follow-up for Phone Call Additional follow up Details #2::    The patient is aware we will work on an appointment with Dr. Eden Emms. However, she would like something called in for a chest cold. She c/o sore throst, cough with greenish mucus. She denies any sob, wheezing or fever. Please advise.Michel Bickers CMA  February 10, 2009 9:16 AM   per SN---ok for pt to have zpak  #1  take as directed.  thanks Randell Loop CMA  February 10, 2009 9:35 AM   Patient is aware of Zpak RX and wants this sent to [facility] on [address]. Follow-up by: Michel Bickers CMA,  February 10, 2009 9:43 AM  New/Updated Medications: ZITHROMAX Z-PAK 250 MG TABS (AZITHROMYCIN) Take as directed Prescriptions: ZITHROMAX Z-PAK 250 MG TABS (AZITHROMYCIN) Take as directed  #1 x 0   Entered by:   Michel Bickers CMA   Authorized by:   [name] MD   Signed by:   Michel Bickers CMA on 02/10/2009   Method used:   Electronically to        [facility] [address] #[id number]* (retail)       [address] [place], [place]  [address]       Ph: [phone]       Fax: [phone]   RxID:   [id number]

## 2010-03-08 NOTE — Progress Notes (Signed)
Summary: Alprazolam and vicodin refills  Phone Note Call from Patient Call back at Home Phone [phone]   Caller: Patient Call For: nadel Summary of Call: pt waiting on refills (see fax request- last emr notes). wants this asap please. note: [facility] [address].  Initial call taken by: Tivis Ringer,  July 30, 2008 10:49 AM  Follow-up for Phone Call        Called spoke with pt.  Pt last refilled Hydrocodone on 07/01/08 at [facility] and [facility].  Advised pt no early refills on meds, and refill requested is early.  Expressed concern pt using 2 pharmacies to fill controlled substances.  Pt states she is only going to be using [facility] for future refills, and she only needs to refill rx early, tomorrow because she is leaving for the beach tomorrow.  Will OK refill on both x 1 only.  [facility]. Follow-up by: Cloyde Reams RN,  July 30, 2008 11:37 AM      Prescriptions: VICODIN 5-500 MG  TABS (HYDROCODONE-ACETAMINOPHEN) 1 by mouth every 6-8 hours as needed  NO EARLY REFIILS  DO NOT EXCEED 3  TABS PER DAY  #90 x 0   Entered by:   Cloyde Reams RN   Authorized by:   [name] MD   Signed by:   Cloyde Reams RN on 07/30/2008   Method used:   Telephoned to ...       [facility] [address] [id number]* (retail)       [address] [place], [place]  [address]       Ph: [phone]       Fax: [phone]   RxID:   [id number] ALPRAZOLAM 0.5 MG  TABS (ALPRAZOLAM) as needed four times a day  not to exceed 4 tabs per day and no early refills  #120 x 0   Entered by:   Cloyde Reams RN   Authorized by:   [name] MD   Signed by:   Cloyde Reams RN on 07/30/2008   Method used:   Telephoned to ...       [facility] [address] [id number]* (retail)       [address] [place], [place]  [address]       Ph: [phone]       Fax: [phone]   RxID:    [id number]

## 2010-03-08 NOTE — Progress Notes (Signed)
Summary: patient referrel denied-LMTCB  Phone Note From Other Clinic Call back at [facility] Phone [phone]   Caller: Appointment Secretary Call For: Kriste Basque Request: Call Patient Summary of Call: Barbara from [facility] says they are unable to asccomodate patient at this time as she has been dismiised from their practice and they were unaware of this at time of referrel.......Marland KitchenPatient is very upset and would like a call from Nurse Marliss Czar)...... Initial call taken by: Denna Haggard, CMA,  April 15, 2009 9:08 AM  Follow-up for Phone Call        [facility]. Carron Curie CMA  April 15, 2009 9:42 AM  Pt states Dr. Jiles Crocker office will not see her due to an outstanding bill form 2003. Pt needs a referral to another doctor for her thyroid. Please advise. She cannot go to [place]. thanks. Carron Curie CMA  April 15, 2009 10:00 AM   Additional Follow-up for Phone Call Additional follow up Details #1::        another order has been placed for endocrinology appt with anyone else not in the [facility] practice for pt. Randell Loop CMA  April 15, 2009 11:22 AM

## 2010-03-08 NOTE — Letter (Signed)
Summary: Appointment - Reminder 2  [facility], Main Office  [address]   [place], [place] [address]   Phone: [phone]  Fax: [phone]     Jun 24, 2009 MRN: [id number]   [facility] [address] [place], [place]  [address]   Dear [name],  Our records indicate that it is time to schedule a follow-up appointment with Dr. Eden Emms. It is very important that we reach you to schedule this appointment. We look forward to participating in your health care needs. Please contact us at the number listed above at your earliest convenience to schedule your appointment.  If you are unable to make an appointment at this time, give [place] a call so we can update our records.     Sincerely,   Migdalia Dk [facility] Scheduling Team
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
## 2010-03-08 NOTE — Assessment & Plan Note (Signed)
Summary: f/u ///kp   Chief Complaint:  hip and knees hurting for 3 months. .  History of Present Illness: 50  year old female patient of Dr. Kriste Basque that has known history of hypothyroidism, chronic pain syndrome, fibromyalgia, chronic headache previous evaluation at headache clinic.  Use chronic pain meds on regular basis.    7/09-- .. since I saw her last in Dec08 she has persisted w/ her complaints of fatigue, aches, pains, and soreness all over- characteristic of her Fibromyalgia... she was prev eval by DrDeveshwar for Rheum but hasn't had a follow up in some time and she has stopped the Flexeril med... she continues to smoke 1ppd and isn't interested in smoking cessation help... she states that she's depressed and has prev seen a psychiatrist but can't remember who it was... she has mult somatic complaints and notes dysphagia and wants a GI referral to evaluate this further...   January 28, 2008 -- Complains over last 3 months having increased joint pain in hips, and knees. Aches at times, increased in cold weather and over last 2 weeks. takes otc with only mild relief. vicodin helps. Denies chest pain, dyspnea, orthopnea, hemoptysis, fever, n/v/d, edema, rash, joint swelling.      Prior Medications Reviewed Using: Patient Recall  Prior Medication List:  SYNTHROID 200 MCG  TABS (LEVOTHYROXINE SODIUM) 1 by mouth once daily [BMN] PREVACID 30 MG  CPDR (LANSOPRAZOLE) Take 1 tablet by mouth once a day WELLBUTRIN XL 300 MG  TB24 (BUPROPION HCL) Take 1 tablet by mouth once a day CELEXA 40 MG  TABS (CITALOPRAM HYDROBROMIDE) Take 1 tablet by mouth once a day ALPRAZOLAM 0.5 MG  TABS (ALPRAZOLAM) as needed four times a day  not to exceed 4 tabs per day and no early refills SKELAXIN 800 MG  TABS (METAXALONE) 1 by mouth three times a day as needed MIRAPEX 0.125 MG  TABS (PRAMIPEXOLE DIHYDROCHLORIDE) take 2 tablets daily MIDRIN 325-65-100 MG  CAPS (APAP-ISOMETHEPTENE-DICHLORAL) once daily as needed  headache VICODIN 5-500 MG  TABS (HYDROCODONE-ACETAMINOPHEN) 1 by mouth every 6-8 hours as needed  NO EARLY REFIILS  DO NOT EXCEED 3  TABS PER DAY   Updated Prior Medication List: SYNTHROID 200 MCG  TABS (LEVOTHYROXINE SODIUM) 1 by mouth once daily [BMN] PREVACID 30 MG  CPDR (LANSOPRAZOLE) Take 1 tablet by mouth once a day WELLBUTRIN XL 300 MG  TB24 (BUPROPION HCL) Take 1 tablet by mouth once a day CELEXA 40 MG  TABS (CITALOPRAM HYDROBROMIDE) Take 1 tablet by mouth once a day ALPRAZOLAM 0.5 MG  TABS (ALPRAZOLAM) as needed four times a day  not to exceed 4 tabs per day and no early refills SKELAXIN 800 MG  TABS (METAXALONE) 1 by mouth three times a day as needed MIRAPEX 0.125 MG  TABS (PRAMIPEXOLE DIHYDROCHLORIDE) take 2 tablets daily MIDRIN 325-65-100 MG  CAPS (APAP-ISOMETHEPTENE-DICHLORAL) once daily as needed headache VICODIN 5-500 MG  TABS (HYDROCODONE-ACETAMINOPHEN) 1 by mouth every 6-8 hours as needed  NO EARLY REFIILS  DO NOT EXCEED 3  TABS PER DAY MOBIC 15 MG TABS (MELOXICAM) 1 by mouth once daily as needed joint pain  Current Allergies (reviewed today): No known allergies   Past Medical History:    Reviewed history from 09/04/2007 and no changes required:       Hx of TMJ PAIN (ICD-524.62)              CIGARETTE SMOKER (ICD-305.1) - she continues to smoke 1ppd.Marland KitchenMarland Kitchen  MITRAL VALVE PROLAPSE (ICD-424.0)        ~  2DEcho 10/06 was normal without MVP seen, no MR, norm LVF... prev 2D in 1990 w/ mild bowing of ant leaflet c/w mild MVP at that time...        ~  NuclearStressTest 2/02 was WNL- no ischemia, no infarction, EF=73%...              HYPERCHOLESTEROLEMIA (ICD-272.0) - hx of Chol values >300 in the past (esp when thyroid was underactive)... she has been inconsistant w/ f/u labs...              HYPOTHYROIDISM (ICD-244.9) - on SYNTHROID 287mcg/d... hypothyroidism initially diagnosed in the 1980's and she's been on meds ever since then...         ~  labs 12/08 showed TSH=  31 ? off her meds... rec to restart her Synthroid regularly...              GERD (ICD-530.81) & DYSPHAGIA UNSPECIFIED (ICD-787.20) - prev GI eval by DrSamLeBauer-               IBS (ICD-564.1) - colonoscopy 3/93 by DrSam was WNL...symptoms were felt to be IBS...        ~  CTAbd 8/06 showed fatty liver, neg abd, s/p hyst, left ovarian cyst, norm right ovary...              LIVER FUNCTION TESTS, ABNORMAL (ICD-794.8) - she had second GI opinion and eval of Abn LFT's by DrBuccini in 2002... he felt she had Fatty Liver disease...                          Family History:    Reviewed history from 09/04/2007 and no changes required:       mother alive age 63  hx of chol. and heart problems       father alive age 52  hx of DM and heart problems       1 brother alive age 51       1 sister alive age 28       1 sister alive age 59  Social History:    Reviewed history from 09/04/2007 and no changes required:       disabled       smoker  1ppd       married       2 children   Risk Factors: Tobacco use:  current    Cigarettes:  Yes -- 1 pack(s) per day   Review of Systems      See HPI   Vital Signs:  Patient Profile:   50 Years Old Female Height:     67 inches Weight:      192.25 pounds O2 Sat:      98 % O2 treatment:    Room Air Temp:     98.1 degrees F oral Pulse rate:   64 / minute BP sitting:   132 / 74  (left arm) Cuff size:   regular  Vitals Entered By: Boone Master CNA (January 28, 2008 11:55 AM)             Is Patient Diabetic? No Comments Medications reviewed with patient Boone Master CNA  January 28, 2008 11:55 AM      Physical Exam  WD, WN, 50 y/o WF in NAD... GENERAL:  Alert, oriented & cooperative... she has 4+ trigger points all over on exam... HEENT:  [place]/AT, EOM-wnl, PERRLA, Fundi-benign, EACs-clear, TMs-wnl, NOSE-clear, THROAT-clear & wnl. NECK:  Supple w/ full ROM; no JVD; normal carotid impulses w/o bruits; no thyromegaly or nodules palpated; no  lymphadenopathy. CHEST:  Clear to P & A; without wheezes/ rales/ or rhonchi. HEART:  Regular Rhythm; without murmurs/ rubs/ or gallops. ABDOMEN:  Soft & nontender; normal bowel sounds; no organomegaly or masses detected. EXT: without deformities or arthritic changes; no varicose veins/ venous insuffic/ or edema. knee rom, nml, no sweeling, no crepitus, Hip internal/external rom nml,  NEURO:  CN's intact; no focal neuro deficits...        Impression & Recommendations:  Problem # 1:  ARTHRALGIA (ICD-719.40) Suspect knee and hip pain is c/w with DJD REC:  Mobic 15mg  once daily as needed joint pain ice or heat to joint as needed  vicodin as needed for severe pain.  follow up 6-8 weeks nadel Please contact office for sooner follow up if symptoms do not improve or worsen  Orders: Est. Patient Level III ([address])   Medications Added to Medication List This Visit: 1)  Mobic 15 Mg Tabs (Meloxicam) .Marland Kitchen.. 1 by mouth once daily as needed joint pain  Complete Medication List: 1)  Synthroid 200 Mcg Tabs (Levothyroxine sodium) .Marland Kitchen.. 1 by mouth once daily 2)  Prevacid 30 Mg Cpdr (Lansoprazole) .... Take 1 tablet by mouth once a day 3)  Wellbutrin Xl 300 Mg Tb24 (Bupropion hcl) .... Take 1 tablet by mouth once a day 4)  Celexa 40 Mg Tabs (Citalopram hydrobromide) .... Take 1 tablet by mouth once a day 5)  Alprazolam 0.5 Mg Tabs (Alprazolam) .... As needed four times a day  not to exceed 4 tabs per day and no early refills 6)  Skelaxin 800 Mg Tabs (Metaxalone) .Marland Kitchen.. 1 by mouth three times a day as needed 7)  Mirapex 0.125 Mg Tabs (Pramipexole dihydrochloride) .... Take 2 tablets daily 8)  Midrin 325-65-100 Mg Caps (Apap-isometheptene-dichloral) .... Once daily as needed headache 9)  Vicodin 5-500 Mg Tabs (Hydrocodone-acetaminophen) .Marland Kitchen.. 1 by mouth every 6-8 hours as needed  no early refiils  do not exceed 3  tabs per day 10)  Mobic 15 Mg Tabs (Meloxicam) .Marland Kitchen.. 1 by mouth once daily as needed joint  pain   Patient Instructions: 1)  Mobic 15mg  once daily as needed joint pain 2)  ice or heat to joint as needed  3)  vicodin as needed for severe pain.  4)  follow up 6-8 weeks  5)  Please contact office for sooner follow up if symptoms do not improve or worsen    Prescriptions: MOBIC 15 MG TABS (MELOXICAM) 1 by mouth once daily as needed joint pain  #30 x 3   Entered and Authorized by:   Rubye Oaks NP   Signed by:   Rubye Oaks NP on 01/28/2008   Method used:   Electronically to        [facility] [phone]* (retail)       [address]       [place], [place]  [address]       Ph: [phone] or [phone]       Fax: [phone]   RxID:   [id number]  ]
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
## 2010-03-08 NOTE — Progress Notes (Signed)
Summary: NEED REFILL  Phone Note Call from Patient Call back at [phone]   Caller: Patient Call For: NADEL Summary of Call: NEED PRESCRIPT FOR VICODIN CALL TO PHARMACY [facility] PATIENT'S CHART HAS BEEN REQUESTED Initial call taken by: Rickard Patience,  February 14, 2007 4:25 PM  Follow-up for Phone Call        Refill request received from pharmacy.  Awaiting SN approval.  In Leigh's inbox. Follow-up by: Cloyde Reams RN,  February 14, 2007 5:07 PM

## 2010-03-08 NOTE — Assessment & Plan Note (Signed)
Summary: FLU SHOT/KLW   Flu Vaccine Consent Questions     Do you have a history of severe allergic reactions to this vaccine? no    Any prior history of allergic reactions to egg and/or gelatin? no    Do you have a sensitivity to the preservative Thimersol? no    Do you have a past history of Guillan-Barre Syndrome? no    Do you currently have an acute febrile illness? no    Have you ever had a severe reaction to latex? no    Vaccine information given and explained to patient? yes    Are you currently pregnant? no    Lot Number:[id number]   Site Given  Left Deltoid IM    Given By Boone Master, CNA. Reynaldo Minium CMA  November 11, 2007 10:33 AM                          Current Allergies: No known allergies           ]

## 2010-03-08 NOTE — Procedures (Signed)
Summary: Colonoscopy, Hemorrhoids, Hyperplastic Polyp   Colonoscopy  Procedure date:  04/14/2008  Findings:      Location:  [facility].    Colonoscopy  Procedure date:  04/14/2008  Findings:      1) Normal terminal ileum 2) Internal and external hemorrhoids 3) 7 mm sigmoid polyp removed. (HYPERPLASTIC) 4) Normal colonoscopy otherwise. Random biopsies taken.(NORMAL)  IBS suggested by response to scope insertion.  Colonoscopy  Procedure date:  04/14/2008  Comments:      Repeat colonoscopy in 10 years.    Procedures Next Due Date:    Colonoscopy: 04/2018  COLONOSCOPY PROCEDURE REPORT  PATIENT:  Tasha [name], Tasha [name]  MR#:  [id number] BIRTHDATE:   06-28-1960, 48 yrs. old   GENDER:   female  ENDOSCOPIST:   Iva Boop, MD, [facility] Referred by: Alroy Dust, M.D.  PROCEDURE DATE:  04/14/2008 PROCEDURE:  Colonoscopy with biopsy and snare polypectomy ASA CLASS:   Class II INDICATIONS: unexplained diarrhea   MEDICATIONS:    Fentanyl 75 mcg IV, Benadryl 50 mg IV, Versed 8 mg IV  DESCRIPTION OF PROCEDURE:   After the risks benefits and alternatives of the procedure were thoroughly explained, informed consent was obtained.  Digital rectal exam was performed and revealed small external hemorrhoids.   The LB [id number]-[id number] endoscope was introduced through the anus and advanced to the terminal ileum which was intubated for a short distance, without limitations.  The quality of the prep was excellent, using MoviPrep.  The instrument was then slowly withdrawn as the colon was fully examined. Insertion time = 7:06; withdrawal time = 11:25. <<PROCEDUREIMAGES>>            <<OLD IMAGES>>  FINDINGS:  The terminal ileum appeared normal. The very distal ileum at the ileocecal valve was seen, opened, but not entered.  A sessile polyp was found in the sigmoid colon. It was 7 mm in size. Polyp was snared without cautery. Retrieval was successful.  This was otherwise a normal  examination. Random biopsies were obtained and sent to pathology.   Retroflexed views in the rectum revealed internal and external hemorrhoids.    The scope was then withdrawn from the patient and the procedure completed.  COMPLICATIONS:   None  ENDOSCOPIC IMPRESSION:  1) Normal terminal ileum  2) Internal and external hemorrhoids 3) 7 mm sigmoid polyp removed.  4) Normal colonoscopy otherwise. Random biopsies taken. IBs suggested by response to scope insertion. RECOMMENDATIONS:  1) await pathology results, will call and arrange follow-up for diarrhea and abnormal LFT's  REPEAT EXAM:   In for Colonoscopy, pending biopsy results.    Iva Boop, MD, [facility]  CC: The Patient Tasha Mcalpine MD      REPORT OF SURGICAL PATHOLOGY   Case #: [id number] Patient Name: Tasha [name], Tasha [name] Office Chart Number:  [id number]   MRN: [id number] Pathologist: Alden Server A. Delila Spence, MD DOB/Age  April 01, 1960 (Age: 67)    Gender: F Date Taken:  04/14/2008 Date Received: 04/15/2008   FINAL DIAGNOSIS   ***MICROSCOPIC EXAMINATION AND DIAGNOSIS***   1.  COLON, BIOPSY:  BENIGN COLONIC MUCOSA.  NO SIGNIFICANT INFLAMMATION OR OTHER ABNORMALITIES IDENTIFIED.    2.  COLON, POLYP:  HYPERPLASTIC POLYP.  NO ADENOMATOUS CHANGE OR MALIGNANCY IDENTIFIED.   COMMENT 1.  There is colorectal mucosa with normal crypt architecture and no objective increase in inflammation.  No active inflammation, microscopic colitis, collagenous colitis or significant chronic changes identified. No hyperplastic or adenomatous changes are seen, and there is no evidence of  malignancy.  (EAA:mw 04/16/08)    mw Date Reported:  04/16/2008     Alden Server A. Delila Spence, MD   *** Electronically Signed Out By EAA ***   This report was created from the original endoscopy report, which was reviewed and signed by the above listed endoscopist.

## 2010-03-08 NOTE — Progress Notes (Signed)
Summary: LMTCB-hot flashes  Phone Note Call from Patient Call back at [phone]   Caller: Patient Call For: nadel Summary of Call: want to talk to nurse about the hot flashes she is having Initial call taken by: Rickard Patience,  May 11, 2008 12:03 PM  Follow-up for Phone Call        Attempted TCB pt.  LM with family member TCB. Cloyde Reams RN  May 11, 2008 12:22 PM   Called spoke with pt.  Pt states she is having hot flashes constantly, has water poring off of her and when she is not having hot flashes she has chills.  Should I refer pt to her GYN?  Please advise.   Follow-up by: Cloyde Reams RN,  May 11, 2008 2:18 PM      Appended Document: LMTCB-hot flashes called and spoke with pt---she stated that her gyn doc passed away and has not been seen by one in a while--explained to pt per SN---try the black cohosh.  pt stated that she has already tried that and it does not work for her.  explained to pt that she needs to set up appt with one and gave her several names of GYN docs.  she stated that she will call and set up appt with one.

## 2010-03-08 NOTE — Progress Notes (Signed)
Summary: PAIN  Phone Note Call from Patient Call back at [phone]   Caller: Patient Call For: Jamorian Dimaria Summary of Call: PAIN IN LEGS NEED TO KNOW WHAT TO DO Initial call taken by: Rickard Patience,  October 23, 2007 9:41 AM  Follow-up for Phone Call        Went to UC; leg pain; arthritis, bone spurs in hips. UC gave anti-inflammatory and Percocet. Has taken Vicodin with no relief. Pt would like orthopiedic dr. Magdalene Molly in EMR. (Dr. Brent Bulla).   Follow-up by: Reynaldo Minium CMA,  October 23, 2007 10:57 AM  New Problems: LEG PAIN (ICD-729.5)   New Problems: LEG PAIN (ICD-729.5)   Appended Document: PAIN Called to schedule an appt with Dr. Farris Has who pt has seen before. Pt owed a balance there and would not let me schedule an appt until she contacted office to clear this balance. Pt advised and she stated that she really didn't want to see an ortho doctor anyway, she just wanted to know what Dr. Kriste Basque thought. Advised pt that if she continued to have problems, that Dr. Jodelle Green advise is to see Dr. Farris Has and that she could probably schedule appt herself once balance is settled, however, if she needed a doctor's referral to contact us back. Pt aware and understood.

## 2010-03-08 NOTE — Progress Notes (Signed)
Summary: refill inquiry  Phone Note Call from Patient Call back at Home Phone [phone]   Caller: Patient Call For: nadel Reason for Call: Refill Medication Summary of Call: Pt states still don't have her prescriptions for xanax and vicodin, pls advise.//[facility] Initial call taken by: Darletta Moll,  December 06, 2009 9:50 AM  Follow-up for Phone Call        called spoke with patient who states that she has spoken with the pharmacy since she called our office and they are getting her refills ready.  - pt was given an additional refill on 9.28.11.   pt aware to keep her 11.8.11 appt with SN. Follow-up by: Boone Master CNA/MA,  December 06, 2009 11:18 AM

## 2010-03-08 NOTE — Progress Notes (Signed)
Summary: pt didn't go to [facility] GI appt on 03/01/2009  Phone Note Outgoing Call   Call placed by: Shelby Dubin PharmD, BCPS, CPP,  March 02, 2009 9:04 AM Call placed to: Patient Summary of Call: Called pt to follow-up to see what [facility] GI appt had recommended.  D/W pt at 0905.  She states that she "was tired" and didn't go.  I asked if she was rescheduled, and she said that she was planning to go at some point (undefined) in the future.  She states that the dexilant prescribed by Dr. Kriste Basque on 02/04/2009 was helping her.   Initial call taken by: Shelby Dubin PharmD, BCPS, CPP,  March 02, 2009 9:06 AM

## 2010-03-08 NOTE — Miscellaneous (Signed)
Summary: Orders Update  Clinical Lists Changes  Orders: Added new Test order of TLB-Lipid Panel (80061-LIPID) - Signed Added new Test order of TLB-Hepatic/Liver Function Pnl (80076-HEPATIC) - Signed Added new Test order of TLB-TSH (Thyroid Stimulating Hormone) (84443-TSH) - Signed

## 2010-03-08 NOTE — Progress Notes (Signed)
Summary: sick  Phone Note Call from Patient Call back at [phone]   Caller: Patient Call For: Sherlynn Tourville Reason for Call: Acute Illness, Talk to Nurse Summary of Call:  sores in the mouth,  [facility] [address] Initial call taken by: Eugene Gavia,  March 12, 2008 4:08 PM  Follow-up for Phone Call        Pt wants rx called in for blisters in mouth.  Please advise, thanks! Follow-up by: Vernie Murders,  March 12, 2008 4:21 PM  Additional Follow-up for Phone Call Additional follow up Details #1::        called and spoke with pt and she stated that she felt better today---will call back if things change Marijo File CMA  March 13, 2008 11:18 AM

## 2010-03-08 NOTE — Assessment & Plan Note (Signed)
Summary: lipid rov/eac  Medications Added ZETIA 10 MG TABS (EZETIMIBE) take 1 tablet by mouth daily FENOFIBRATE 54 MG TABS (FENOFIBRATE) take 1 tablet by mouth daily        [name] is seen in the lipid clinic for dyslipidemia f/u. Patient had previous visit with lipid clinic in january but has not returned to lipid clinic until now. She is not currently on any hyperlipidemia managemet. She was intolerant to crestor and lipitor (leg cramps),  pitavastatin and possibly simvastatin as well. Exercising is difficult with her fibromyalgia. She states her feet hurt really bad and the most she could walk would be 5 minutes at a time. A typical breakfast includes cereal with 2% milk, usually skips lunch, and dinner grilled chicken. She says she grills most of her meals and eats a lot of fruits and vegetables. Some weeks she will eat out every night and others just a few times a week.   Lipid Management Provider  Mammie Lorenzo, PharmD  Current Medications (verified): 1)  Levothroid 150 Mcg Tabs (Levothyroxine Sodium) .... Take 1 Tablet By Mouth Once A Day 2)  Dexilant 60 Mg Cpdr (Dexlansoprazole) .... Take 1 Cap By Mouth Once Daily- 30 Min Before The 1st Meal of The Day... 3)  Vicodin 5-500 Mg  Tabs (Hydrocodone-Acetaminophen) .Marland Kitchen.. 1 By Mouth Every 6-8 Hours As Needed  No Early Refiils  Do Not Exceed 3  Tabs Per Day 4)  Mobic 15 Mg Tabs (Meloxicam) .Marland Kitchen.. 1 By Mouth Once Daily As Needed Joint Pain 5)  Maxalt-Mlt 10 Mg Tbdp (Rizatriptan Benzoate) .... Place One Tab Under Tongue As Needed For Migraine Headache... 6)  Wellbutrin Xl 300 Mg  Tb24 (Bupropion Hcl) .... Take 1 Tablet By Mouth Once A Day 7)  Celexa 40 Mg  Tabs (Citalopram Hydrobromide) .... Take 1 Tablet By Mouth Once A Day 8)  Alprazolam 0.5 Mg  Tabs (Alprazolam) .... As Needed Four Times A Day  Not To Exceed 4 Tabs Per Day and No Early Refills 9)  Zovirax 5 % Oint (Acyclovir) .... Apply To Fever Blister Every 3-4 Hours As Needed 10)   Anusol-Hc 2.5 % Crea (Hydrocortisone) .... Apply After Each Bowel Movement... 11)  Mirapex 0.25 Mg Tabs (Pramipexole Dihydrochloride) .... Take 2 Tabs By Mouth Qhs For Legs... 12)  Zetia 10 Mg Tabs (Ezetimibe) .... Take 1 Tablet By Mouth Daily 13)  Fenofibrate 54 Mg Tabs (Fenofibrate) .... Take 1 Tablet By Mouth Daily  Allergies: No Known Drug Allergies  Past History:  Past Medical History: Last updated: 12/14/2009 GLOBUS (ICD-300.11) Hx of TMJ PAIN (ICD-524.62) CIGARETTE SMOKER (ICD-305.1) ? of MITRAL VALVE PROLAPSE (ICD-424.0) CHEST PAIN (ICD-786.50) HYPERCHOLESTEROLEMIA (ICD-272.0) HYPOTHYROIDISM (ICD-244.9) OBESITY, MILD (ICD-278.02) GERD (ICD-530.81) DYSPHAGIA UNSPECIFIED (ICD-787.20) IBS (ICD-564.1) LIVER FUNCTION TESTS, ABNORMAL (ICD-794.8) FIBROMYALGIA (ICD-729.1) HEADACHE, CHRONIC (ICD-784.0) PERIODIC LIMB MOVEMENT DISORDER (ICD-327.51) ANXIETY (ICD-300.00) DEPRESSION (ICD-311)  Family History: Last updated: 04/20/2009 mother alive age 28  hx of chol. and heart problems father alive age 8  hx of DM and heart problems 1 brother alive age 43 1 sister alive age 56 1 sister alive age 29 Family History of Colon Cancer: Paternal Aunt Family History of Breast Cancer: Maternal Aunt x 2 Family History of Stomach Cancer: Paternal Aunt, maternal grandmother Family History of Diabetes: Father, aunt x 2, uncles x 2 Family History of Heart Disease: 75, Father, Aunt, Jon Gills, Grandmother (paternal and maternal) Family History of Kidney Disease: cousin no thyroid dz  Social History: Last updated: 10/19/2008 disabled smoker -1 ppd  married 2 children Alcohol Use - yes-occasionally Daily Caffeine Use-5 glasses a day Illicit Drug Use - no Patient does not get regular exercise.   Risk Factors: Exercise: no (12/14/2009)  Risk Factors: Smoking Status: current (12/14/2009) Packs/Day: 1.0 (12/14/2009)   Vital Signs:  Patient profile:   50 year old  female Pulse rate:   75 / minute BP sitting:   121 / 83  (left arm)  Impression & Recommendations:  Problem # 1:  HYPERLIPIDEMIA (ICD-272.4) TC 310, TG 243 (goal <150), HDL 39, LDL 223 (goal <130). Due to multiple statin intolerances, will begin zetia 10 mg daily and fenofibrate 54 mg daily for LDL and TG lowering. Patient did not want to try fish oil. She stated she has bad GERD and doesn't want to burp up the fishy taste. Exercise will be a problem but she agreed to exercise with 5 pound arm weights with goal of 30 mins/day 5 days/week. She will do this on days she feels she cannot walk. On days she feels better, she will try to walk in 5 minute intervals with goal to work up to 30 minutes. She will continue eating fruits and vegetables and will cut out the fast food and try to cook more at home.  f/u 1 month.    Her updated medication list for this problem includes:    Zetia 10 Mg Tabs (Ezetimibe) .Marland Kitchen... Take 1 tablet by mouth daily    Fenofibrate 54 Mg Tabs (Fenofibrate) .Marland Kitchen... Take 1 tablet by mouth daily  Patient Instructions: 1)  start taking fenofibrate 54 mg daily and zetia 10 my daily 2)  do arm weight exercises on days you can't walk 3)  walk in 5 min intervals on days you can walk 4)  goal to work up to 30 minutes/day 5 days/week 5)  cut out fast food  6)  decrease fried foods  7)  lab appointment fasting- january 10th at 9:45 8)  lipid clinic- january 12 th at 9:30 Prescriptions: FENOFIBRATE 54 MG TABS (FENOFIBRATE) take 1 tablet by mouth daily  #30 x 1   Entered by:   Mammie Lorenzo PharmD   Authorized by:   Bosie Clos, MD, [facility]   Signed by:   Mammie Lorenzo PharmD on 01/06/2010   Method used:   Electronically to        [facility] (retail)       [address]       [place], [place]  [address]       Ph: [phone]       Fax: [phone]   RxID:   [id number] ZETIA 10 MG TABS (EZETIMIBE) take 1 tablet by mouth daily  #30 x 3    Entered by:   Mammie Lorenzo PharmD   Authorized by:   Bosie Clos, MD, [facility]   Signed by:   Mammie Lorenzo PharmD on 01/06/2010   Method used:   Electronically to        [facility] (retail)       [address]       [place], [place]  [address]       Ph: [phone]       Fax: [phone]   RxID:   [id number]

## 2010-03-08 NOTE — Progress Notes (Signed)
Summary: appt for thyroid check   [id number]  Phone Note Call from Patient   Caller: Patient Call For: nadel Summary of Call: pt unsure about who she was to see for thyroid appt would like to resch     Initial call taken by: Rickard Patience,  March 30, 2009 10:35 AM  Follow-up for Phone Call        [facility].  Aundra Millet Reynolds LPN  March 30, 2009 10:53 AM  pt returned your call.Darletta Moll  March 30, 2009 10:59 AM  Called and spoke with pt.  Pt states that she would like to be referred to endo for her thyroid.  She states that SN referred her to someone in the past but she never kept appt.  Please advise thanks! Vernie Murders  March 30, 2009 12:48 PM   Additional Follow-up for Phone Call Additional follow up Details #1::        per SN---rec Dr. Sharl Ma for endocrinologist.  thanks   the order has been put in the computer Randell Loop CMA  March 30, 2009 2:17 PM     Additional Follow-up for Phone Call Additional follow up Details #2::    Spoke with pt and made aware that order has been sent to [facility] for referral to Dr Sharl Ma. Follow-up by: Vernie Murders,  March 30, 2009 2:20 PM

## 2010-03-08 NOTE — Assessment & Plan Note (Signed)
Summary: 17 month follow up/lwa   Primary Care Provider:  Alroy Dust, MD  CC:  17 month ROV 7 review of mult medical problems....  History of Present Illness: 50 y/o WF here for a follow up visit... she has multiple medical problems as noted below...    ~  Jul09:  since I saw her last in Dec08 she has persisted w/ her complaints of fatigue, aches, pains, and soreness all over- characteristic of her Fibromyalgia... she was prev eval by DrDeveshwar for Rheum but hasn't had a follow up in some time and she has stopped the Flexeril med... she continues to smoke 1ppd and isn't interested in smoking cessation help... she states that she's depressed and has prev seen a psychiatrist but can't remember who it was... she has mult somatic complaints and notes dysphagia and wants a GI referral to evaluate this further...    ~  February 04, 2009:  17 month ROv w/ mult complaints- headaches; heartburn; palpitations; pain in hips/ legs, feet; incr fatigue; ?black-outs that improve w/ Xanax... she has mult specialists tending to her multisys complaints- see below.    Current Problem List:  Hx of TMJ PAIN (ICD-524.62) + GLOBUS Phenomenon believed related to GERD...  ~  9/10: ENT eval by Darlin Priestly (referred by DrSMiller)- Globus likely due to reflux, continue PPI Bid.  CIGARETTE SMOKER (ICD-305.1) - she continues to smoke 1ppd... not interested in smoking cessation help.  ~  CXR 9/10 showed norm heart size, clear lungs, NAD.Marland KitchenMarland Kitchen  ? of MITRAL VALVE PROLAPSE (ICD-424.0) - followed by Walker Kehr for Cards... she has intermittent CP (likely related to her FM), and palpitations that continue to be a problem for her- advised to quit nicotine & caffeine...   ~  2DEcho 10/06 was normal without MVP seen, no MR, norm LVF... prev 2D in 1990 w/ mild bowing of ant leaflet c/w mild MVP at that time...  ~  NuclearStressTest 2/02 was WNL- no ischemia, no infarction, EF=73%...  ~  Cardiac eval 9/10 by Walker Kehr for atyp CP-  NuclearStressTest was normal- EF=70%...  HYPERCHOLESTEROLEMIA (ICD-272.0) - hx of Chol values >300 in the past (esp when thyroid was underactive)... she has been inconsistant w/ f/u labs...  ~  FLP 7/09 showed TChol 301, TG 284, HDL 32, LDL 21... [facility], tried on CRESTOR5 but she stopped due to leg pain & has been on diet alone.  ~  FLP 7/10 showed TChol 318, TG 318, HDL 32, LDL 228... given [id number], but stopped this on her own... rec> [facility].  HYPOTHYROIDISM (ICD-244.9) - on SYNTHROID 296mcg/d... hypothyroidism initially diagnosed in the 1980's and she's been on meds ever since then...   ~  labs 12/08 showed TSH= 31 ? off her meds... rec to restart her Synthroid regularly...  ~  labs 7/09 on Levo200 showed TSH= 2.24  ~  labs 3/10 on Levo200 showed TSH= 0.95  ~  labs 7/10 on Levo200 showed TSH= 4.22  GERD (ICD-530.81) & DYSPHAGIA UNSPECIFIED (ICD-787.20) - prev GI eval by DrSamLeBauer- now sees DrGessner & was advised to quit smoking & caffeine, +given ACIPHEX & CARAFATE but she stopped both meds and uses Tums/ Rolaids/ Maalox etc...  ~  EGD 10/10 by DrGessner showed severe gastritis, norm esoph, norm duodenum, neg HPylori.  IBS (ICD-564.1) - colonoscopy 3/93 by DrSam was WNL...symptoms were felt to be IBS...  ~  CTAbd 8/06 showed fatty liver, neg abd, s/p hyst, left ovarian cyst, norm right ovary...  ~  Colonoscopy  3/10 by drGessner showed 7mm hyperplastic polyp, hems, otherw neg... f/u planned 110yrs.  ~  5/10:  f/u DrGessner w/ IBS/ gas/ alt bowel habits- given ALIGN & BENTYL...  LIVER FUNCTION TESTS, ABNORMAL (ICD-794.8) - she had second GI opinion and eval of Abn LFT's by DrBuccini in 2002... he felt she had Fatty Liver Disease... she's been counselled to lose weight/ low fat diet/ incr exercise...  ~  labs 7/09 showed SGOT= 45, SGPT= 65  ~  labs 3/10 showed SGOT= 48, SGPT= 64  ~  labs 7/10 showed SGOT= 59, SGPT= 77  FIBROMYALGIA (ICD-729.1) - currently on  SKELAXIN 800mg Tid as needed... over the years she has seen DrTruslow, DrZeminski, & DrDeveshwar for Rheum opinions... she has also been treated by DrWillen/chiropractor... she has tried Flexeril, DCN100, Vicodin, Mobic, now Skelaxin...   HEADACHE, CHRONIC (ICD-784.0) - on MIDRIN Prn, & VICODIN limited to 3/d no early refills... she has been eval by Dr Meryl Crutch & DrWeymann for Neurology- they rec the Wellbutrin & Celexa... prev Neuro [place] w/ DrHaworth for c/o numbness & tingling- (***of note he recorded that her ROS was pos for every question that he asked her***)... he rec MRI Brain, NCV's, and extensive lab work- we never recieved a follow up note after the initial visit... prev MRI from Neuro in [place] was WNL in the 1990's...  ~  9/10: eval by DrReynolds- he thought Migraines & Rx w/ MAXALT, she is overdue for f/u visit & encouraged to call for f/u.  PERIODIC LIMB MOVEMENT DISORDER (ICD-327.51) - she had a prev sleep study 1/04 w/ signif periodic limb movements (leg jerks) w/ sleep disruption (study showed RDI= 1, but PLM index was 11- mostly arousals)... started on MIRAPEX 0.25mg - 2 Qhs...  ANXIETY (ICD-300.00) - on ALPRAZOLAM 0.5mg Qid- "it's the only thing that helps".  DEPRESSION (ICD-311) - on WELLBUTRIN XL 300mg /d,  & CELEXA 40mg /d... she has seen a psychiatrist prev but she can't remember who it was...    Allergies (verified): No Known Drug Allergies  Comments:  Nurse/Medical Assistant: The patient's medications and allergies were reviewed with the patient and were updated in the Medication and Allergy Lists.  Past History:  Past Medical History:  GLOBUS (ICD-300.11) Hx of TMJ PAIN (ICD-524.62) CIGARETTE SMOKER (ICD-305.1) ? of MITRAL VALVE PROLAPSE (ICD-424.0) CHEST PAIN (ICD-786.50) HYPERCHOLESTEROLEMIA (ICD-272.0) HYPOTHYROIDISM (ICD-244.9) OBESITY, MILD (ICD-278.02) GERD (ICD-530.81) DYSPHAGIA UNSPECIFIED (ICD-787.20) IBS (ICD-564.1) LIVER FUNCTION TESTS,  ABNORMAL (ICD-794.8) FIBROMYALGIA (ICD-729.1) HEADACHE, CHRONIC (ICD-784.0) PERIODIC LIMB MOVEMENT DISORDER (ICD-327.51) ANXIETY (ICD-300.00) DEPRESSION (ICD-311)  Past Surgical History: S/P ELap w/ lysis of adhesions & excision of hydatid cyst left tube 1992 S/P supracervical hysterectomy  by DrFore in 2002 Tonsillectomy Arm Surgery-right Wrist surgery-right Foot surgery-Left  Family History: Reviewed history from 06/24/2008 and no changes required. mother alive age 58  hx of chol. and heart problems father alive age 21  hx of DM and heart problems 1 brother alive age 64 1 sister alive age 51 1 sister alive age 69 Family History of Colon Cancer: Paternal Aunt Family History of Breast Cancer: Maternal Aunt x 2 Family History of Stomach Cancer: Paternal Aunt, maternal grandmother Family History of Diabetes: Father, aunt x 2, uncles x 2 Family History of Heart Disease: Grandmother, Father, Aunt, Emelia Loron, Grandmother (paternal and maternal) Family History of Kidney Disease: cousin  Social History: Reviewed history from 10/19/2008 and no changes required. disabled smoker -1 ppd married 2 children Alcohol Use - yes-occasionally Daily Caffeine Use-5 glasses a day Illicit Drug Use -  no Patient does not get regular exercise.   Review of Systems      See HPI       The patient complains of chest pain, dyspnea on exertion, headaches, abdominal pain, severe indigestion/heartburn, and muscle weakness.  The patient denies anorexia, fever, weight loss, weight gain, vision loss, decreased hearing, hoarseness, syncope, peripheral edema, prolonged cough, hemoptysis, melena, hematochezia, hematuria, incontinence, suspicious skin lesions, transient blindness, difficulty walking, depression, unusual weight change, abnormal bleeding, enlarged lymph nodes, and angioedema.    Vital Signs:  Patient profile:   50 year old female Height:      67 inches Weight:      194.50 pounds BMI:      30.57 O2 Sat:      97 % on Room air Temp:     98.7 degrees F oral Pulse rate:   78 / minute BP sitting:   122 / 78  (right arm) Cuff size:   regular  Vitals Entered By: Randell Loop CMA (February 04, 2009 11:47 AM)  O2 Sat at Rest %:  97 O2 Flow:  Room air CC: 17 month ROV 7 review of mult medical problems... Is Patient Diabetic? No Comments meds updated today   Physical Exam  Additional Exam:  WD, Overweight, 50y/o WF in NAD... GENERAL:  Alert, oriented & cooperative... HEENT:  /AT, EOM-wnl, PERRLA, EACs-clear, TMs-wnl, NOSE-clear, THROAT- sl red... NECK:  Supple w/ fairROM; no JVD; normal carotid impulses w/o bruits; no thyromegaly or nodules palpated; no lymphadenopathy. CHEST:  Clear to P & A; without wheezes/ rales/ or rhonchi heard... HEART:  Regular Rhythm; without murmurs/ rubs/ or gallops detected... ABDOMEN:  Soft & nontender; normal bowel sounds; no organomegaly or masses detected. EXT: without deformities or arthritic changes; no varicose veins/ venous insuffic/ or edema... she has mult diffuse trigger points... NEURO:  CN's intact; no focal neuro deficits... DERM:  no lesions, no rash noted...      Impression & Recommendations:  Problem # 1:  GLOBUS (ICD-300.11) She has persistant reflux symptoms, throat symptoms, etc... she has stopped her PPI therapy- ?why... taking antacids etc...  She is encourg\aged to take PPI Bid, and follow up w/ ENT & GI- DrGessner regarding her on-going symptoms.  Problem # 2:  CIGARETTE SMOKER (ICD-305.1) We discussed smoking cessation, and offered Chantix etc... she declines help but we left the door open for help in the future when she is ready...  Problem # 3:  CHEST PAIN (ICD-786.50) She had eval DrNishan-  atyp CP, palpit, etc... but prev 2DEcho w/o MVP seen... neg Nuclear Stress Test... She is again advised to elim caffeine, nicotine, etc...  Problem # 4:  HYPERCHOLESTEROLEMIA (ICD-272.0) Severe problem-  she was intol to  Crestor5, stopped Zoco20 on her own, didn't f/u LC... Advised of the very serious nature of her lipid problem and the need for active management of this problem... REC>  ret to the [facility] for their help...  Orders: Misc. Referral (Misc. Ref)  Problem # 5:  HYPOTHYROIDISM (ICD-244.9) Stable on the synthroid 293mcg/d... continue same. Her updated medication list for this problem includes:    Synthroid 200 Mcg Tabs (Levothyroxine sodium) .Marland Kitchen... 1 by mouth once daily  Problem # 6:  IBS (ICD-564.1) Mult GI problems managed by Rodena Medin-  she is encouraged to f/u w/ him...  Problem # 7:  LIVER FUNCTION TESTS, ABNORMAL (ICD-794.8) Likely hepatic steatosis... must diet, exercise, get weight down... f/u LFT's on statin Rx by Lipid clinic...  Problem # 8:  HEADACHE, CHRONIC (ICD-784.0) Followed by DrReynolds and overdue for f/u...he Rx's Maxalt but this isn't on her list... Her updated medication list for this problem includes:    Vicodin 5-500 Mg Tabs (Hydrocodone-acetaminophen) .Marland Kitchen... 1 by mouth every 6-8 hours as needed  no early refiils    Mobic 15 Mg Tabs (Meloxicam) .Marland Kitchen... 1 by mouth once daily as needed joint pain  Problem # 9:  FIBROMYALGIA (ICD-729.1) This is likely underlying many of her other problems, symptoms...  REC> needs active management by Rheumatology & she will f/u w/ drdeveshwar... Her updated medication list for this problem includes:    Vicodin 5-500 Mg Tabs (Hydrocodone-acetaminophen) .Marland Kitchen... 1 by mouth every 6-8 hours as needed  no early refiils     Mobic 15 Mg Tabs (Meloxicam) .Marland Kitchen... 1 by mouth once daily as needed joint pain    Skelaxin 800 Mg Tabs (Metaxalone) .Marland Kitchen... 1 by mouth three times a day as needed for muscle spasm...  Problem # 10:  DEPRESSION (ICD-311) She continues on meds and she feels thse are helping... Her updated medication list for this problem includes:    Wellbutrin Xl 300 Mg Tb24 (Bupropion hcl) .Marland Kitchen... Take 1 tablet by mouth once a day    Celexa  40 Mg Tabs (Citalopram hydrobromide) .Marland Kitchen... Take 1 tablet by mouth once a day    Alprazolam 0.5 Mg Tabs (Alprazolam) .Marland Kitchen... As needed four times a day  not to exceed 4 tabs per day and no early refills  Complete Medication List: 1)  Synthroid 200 Mcg Tabs (Levothyroxine sodium) .Marland Kitchen.. 1 by mouth once daily 2)  Dexilant 60 Mg Cpdr (Dexlansoprazole) .... Take 1 cap by mouth once daily- 30 min before the 1st meal of the day... 3)  Vicodin 5-500 Mg Tabs (Hydrocodone-acetaminophen) .Marland Kitchen.. 1 by mouth every 6-8 hours as needed  no early refiils  do not exceed 3  tabs per day 4)  Mobic 15 Mg Tabs (Meloxicam) .Marland Kitchen.. 1 by mouth once daily as needed joint pain 5)  Skelaxin 800 Mg Tabs (Metaxalone) .Marland Kitchen.. 1 by mouth three times a day as needed for muscle spasm... 6)  Wellbutrin Xl 300 Mg Tb24 (Bupropion hcl) .... Take 1 tablet by mouth once a day 7)  Celexa 40 Mg Tabs (Citalopram hydrobromide) .... Take 1 tablet by mouth once a day 8)  Alprazolam 0.5 Mg Tabs (Alprazolam) .... As needed four times a day  not to exceed 4 tabs per day and no early refills 9)  Zovirax 5 % Oint (Acyclovir) .... Apply to fever blister every 3-4 hours 10)  Tums 500 Mg Chew (Calcium carbonate antacid) .... Take as needed 11)  Rolaids 334 Mg Chew (Dihydroxyaluminum sod carb) .... Take as needed 12)  Maalox 600 Mg Chew (Calcium carbonate antacid) .... Take as needed  Other Orders: Prescription Created Electronically [phone])  Patient Instructions: 1)  Today we updated your med list- see below.... 2)  We refilled your meds for the next 6 months... 3)  Please schedule a follow-up appointment in 6 months. 4)  We will arrange for a referral back to the [facility] to work on your Cholesterol... Prescriptions: ALPRAZOLAM 0.5 MG  TABS (ALPRAZOLAM) as needed four times a day  not to exceed 4 tabs per day and no early refills  #120 x 5   Entered and Authorized by:   [name] MD   Signed by:   [name] MD on 02/04/2009   Method used:    Print then Give to  Patient   RxID:   [id number] CELEXA 40 MG  TABS (CITALOPRAM HYDROBROMIDE) Take 1 tablet by mouth once a day  #30 x 5   Entered and Authorized by:   [name] MD   Signed by:   [name] MD on 02/04/2009   Method used:   Print then Give to Patient   RxID:   [id number] WELLBUTRIN XL 300 MG  TB24 (BUPROPION HCL) Take 1 tablet by mouth once a day  #30 x 5   Entered and Authorized by:   [name] MD   Signed by:   [name] MD on 02/04/2009   Method used:   Print then Give to Patient   RxID:   [id number] SKELAXIN 800 MG  TABS (METAXALONE) 1 by mouth three times a day as needed for muscle spasm...  #90 x 5   Entered and Authorized by:   [name] MD   Signed by:   [name] MD on 02/04/2009   Method used:   Print then Give to Patient   RxID:   [id number] MOBIC 15 MG TABS (MELOXICAM) 1 by mouth once daily as needed joint pain  #30 x 5   Entered and Authorized by:   [name] MD   Signed by:   [name] MD on 02/04/2009   Method used:   Print then Give to Patient   RxID:   [id number] VICODIN 5-500 MG  TABS (HYDROCODONE-ACETAMINOPHEN) 1 by mouth every 6-8 hours as needed  NO EARLY REFIILS  DO NOT EXCEED 3  TABS PER DAY  #90 x 5   Entered and Authorized by:   [name] MD   Signed by:   [name] MD on 02/04/2009   Method used:   Print then Give to Patient   RxID:   [id number] SYNTHROID 200 MCG  TABS (LEVOTHYROXINE SODIUM) 1 by mouth once daily Brand medically necessary #30 x prn   Entered and Authorized by:   [name] MD   Signed by:   [name] MD on 02/04/2009   Method used:   Print then Give to Patient   RxID:   [id number] DEXILANT 60 MG CPDR (DEXLANSOPRAZOLE) take 1 cap by mouth once daily- 30 min before the 1st meal of the day...  #30 x prn   Entered and Authorized by:   [name] MD   Signed by:   [name] MD on 02/04/2009   Method used:   Print then Give to  Patient   RxID:   [id number]

## 2010-03-08 NOTE — Progress Notes (Signed)
Summary: need referral  Phone Note Call from Patient Call back at [phone]   Caller: Patient Call For: Orvil Faraone Summary of Call: need referral to see endocrinologist for thyroid Initial call taken by: Rickard Patience,  August 20, 2007 9:26 AM  Follow-up for Phone Call        Per Dr. Kriste Basque refer to Dr. Timothy Lasso please.  Thanks. Follow-up by: Vernie Murders,  August 20, 2007 4:50 PM  Additional Follow-up for Phone Call Additional follow up Details #1::        dr Horald Pollen has left this practice will see dr Sharl Ma 09/20/07

## 2010-03-08 NOTE — Progress Notes (Signed)
Summary: tiredness  Medications Added DARVOCET-N 100 100-650 MG TABS (PROPOXYPHENE N-APAP)  MIRAPEX 0.25 MG TABS (PRAMIPEXOLE DIHYDROCHLORIDE)  XANAX 0.5 MG TABS (ALPRAZOLAM)  ZITHROMAX Z-PAK 250 MG TABS (AZITHROMYCIN) Take 2 tablet by mouth as directed       Phone Note Call from Patient Call back at Home Phone [phone]   Caller: Patient Call For: nadel Reason for Call: Talk to Nurse Summary of Call: pt experiencing extreme tiredness.  2-3 weeks.  when she gets "dead tired" she gives out and has to lay down and sleep, will sleep for about 12 hours.  She has thyroid problem, should she come in for thyroid check? chart ordered Initial call taken by: Eugene Gavia,  January 02, 2007 8:42 AM  Follow-up for Phone Call        Appt Scheduled for 01-08-07 at 11:00 am  Follow-up by: Vernie Murders,  January 02, 2007 1:25 PM    New/Updated Medications: DARVOCET-N 100 100-650 MG TABS (PROPOXYPHENE N-APAP)  MIRAPEX 0.25 MG TABS (PRAMIPEXOLE DIHYDROCHLORIDE)  XANAX 0.5 MG TABS (ALPRAZOLAM)  ZITHROMAX Z-PAK 250 MG TABS (AZITHROMYCIN) Take 2 tablet by mouth as directed

## 2010-03-08 NOTE — Progress Notes (Signed)
Summary: refils  Phone Note From Pharmacy Call back at [phone]   Caller: Patient Call For: Gisela Lea Caller: [facility] [address] #[id number]* Call For: Graysen Depaula  Summary of Call: need xanax and vicodin refilled Initial call taken by: Rickard Patience,  September 02, 2008 9:04 AM  Follow-up for Phone Call        Spoke with pharmacy; no refills until pt sees SN. refer to prior requesets. Reynaldo Minium CMA  September 02, 2008 9:26 AM

## 2010-03-08 NOTE — Progress Notes (Signed)
Summary: refills- pt at pharmacy now  Phone Note Call from Patient   Caller: Patient Call For: Tasha [name] Summary of Call: pt has an appt w/ sn for 2/ 22 /10. pt is at [facility] [address] waiting for refills of hydrocodone and alprazolam. pt's cell [phone] Initial call taken by: Tivis Ringer,  January 18, 2009 4:19 PM  Follow-up for Phone Call        per TP---ok to call in for 1 month supply.  pt made appt for 12-30 to see SN for refills of the xanax and the vicodin. Randell Loop [facility]  January 18, 2009 4:41 PM

## 2010-03-08 NOTE — Progress Notes (Signed)
Summary: Did not stop eating nuts or corn   Phone Note Call from Patient Call back at Home Phone [phone]   Call For: DR Leone Payor Reason for Call: Talk to Nurse Summary of Call: Scheduled for Colon tomorrow. Did not read in the instructions where it says not to eat nuts or corn or seeds so she never STOPPED EATING. Initial call taken by: Leanor Kail [facility],  April 13, 2008 4:16 PM  Follow-up for Phone Call        Spoke with patient and she said that she still had eaten nuts and seeds and cucumbers.  Told her to still come in tomorrow and to finish all of her prep.  She was also told to stay on clear liquids until her procedure tomorrow. Follow-up by: Clide Cliff RN,  April 13, 2008 5:21 PM

## 2010-03-08 NOTE — Progress Notes (Signed)
Summary: rx refills  Phone Note Call from Patient   Caller: Patient Call For: nadel Summary of Call: pt says that [facility] [address] is faxing requests for refills of vicodin and xanax.  ALSO: pt has an appt tomorrow w/ tp for blurred vision/ headaches- and has made a f/u w/ nadel (but wants to see him before next avail in late november). pt # [id number] Initial call taken by: Tivis Ringer,  August 31, 2008 3:41 PM  Follow-up for Phone Call        pt has pending ov with TP today at 11:30am.  Will address this message at her visit.  Aundra Millet Reynolds LPN  September 01, 2008 10:55 AM

## 2010-03-08 NOTE — Assessment & Plan Note (Signed)
Summary: routine f/u appt/ ok per leigh/mg   Primary Care Provider:  Alroy Dust, MD  CC:  6 month ROV & review of mult medical problems....  History of Present Illness: 50 y/o WF here for a follow up visit... she has multiple medical problems as noted below...    ~  Jun 14, 2009:  she has been to the lipid clinic and tried on Lipitor but INTOL w/ leg pain & asking for my suggestion> we discussed the lowest dose pill avail= Livalo & she will try it...  she saw DrEllison for Endocrine w/ hypothyroidism> he incr her dose of Synthroid from 125 to 150 but she didn't like how she felt on this dose so she decr back to 136mcg/d on her own (the main prob over the yrs was medication compliance & hard to guess her dose when taking intermittently)...  DrGessner had referred her to [facility] for GI consult but she tells me she cancelled the appt when her symptoms resolved on the dexilant Rx that we perscribed...  finally c/o reurrent HA's and visual symptoms c/w migraines>  prev eval DrReynolds & Rx w/ Maxalt...   ~  December 14, 2009:  she persists w/ mult somatic complaints that characterize her FM & assoc problems> fatigue, legs ache & she wants Mirapex refilled, she stopped the Livalo after 2 wks due to leg pains she says... she saw DrReynolds 6/11 for f/u migraines & he advised her to take the Maxalt earlier in the course of a HA, & perscribed Nortriptyline 25mg  Qhs for prophylaxis... she wants all her pain meds refilled (see below).   Current Problem List:  Hx of TMJ PAIN (ICD-524.62) + GLOBUS Phenomenon believed related to GERD...  ~  9/10: ENT eval by Darlin Priestly (referred by DrSMiller)- Globus likely due to reflux, continue PPI Bid, but she only takes intermittently...  CIGARETTE SMOKER (ICD-305.1) - she continues to smoke 1ppd... not interested in smoking cessation help.  ~  CXR 9/10 showed norm heart size, clear lungs, NAD.Marland KitchenMarland Kitchen  ? of MITRAL VALVE PROLAPSE (ICD-424.0) - followed by Walker Kehr for  Cards... she has intermittent CP (likely related to her FM), and palpitations that continue to be a problem for her- advised to quit nicotine & caffeine...   ~  2DEcho 10/06 was normal without MVP seen, no MR, norm LVF... prev 2D in 1990 w/ mild bowing of ant leaflet c/w mild MVP at that time...  ~  NuclearStressTest 2/02 was WNL- no ischemia, no infarction, EF=73%...  ~  Cardiac eval 9/10 by Walker Kehr for atyp CP- NuclearStressTest was normal- EF=70%...  HYPERCHOLESTEROLEMIA (ICD-272.0) - hx of Chol values >300 in the past (esp when thyroid was underactive)... she has been intolerant to numerous statin meds and inconsistant w/ f/u labs... currently on "diet therapy"...  ~  FLP 7/09 showed TChol 301, TG 284, HDL 32, LDL 210... referred to [facility], tried on CRESTOR5 but she stopped due to leg pain.  ~  FLP 7/10 showed TChol 318, TG 318, HDL 32, LDL 228... given KYHCW23, but stopped this on her own... rec> return to [facility]inic.  ~  FLP 1/11 showed TChol 283, TG 271, HDL 32, LDL 212  ~  Lipid Clinic 1/11> she reports trial LIPITOR but intol w/ leg pain & never ret to clinic.  ~  5/11:  we discussed trial of the lowest dose statin avail> LIVALO 2mg  samples start 1/2 tab Qhs... she reports stopping this med after 2 wks c/o leg pains & she  is not willing to continue statin Rx or ret to lipid clinic.  HYPOTHYROIDISM (ICD-244.9) - on SYNTHROID 136mcg/d... hypothyroidism initially diagnosed in the 1980's and she's been on meds ever since then...   ~  labs 12/08 showed TSH= 31 ? off her meds... rec to restart her Synthroid regularly...  ~  labs 7/09 on Levo200 showed TSH= 2.24  ~  labs 3/10 on Levo200 showed TSH= 0.95  ~  labs 7/10 on Levo200 showed TSH= 4.22... suspect she doesn't take med daily.  ~  labs 1/11 showed TSH= 10.95... pt reminded to take Synthroid dose 150mcg/d regularly.  ~  3/11: pt saw DrEllison for consult & Synthroid incr to 158mcg/d but she "didn't feel right" on this & ret to  177mcg/d.  GERD (ICD-530.81) & DYSPHAGIA UNSPECIFIED (ICD-787.20) - prev GI eval by DrSamLeBauer- now sees DrGessner & was advised to quit smoking & caffeine, +given Aciphex & Carafate but she stopped both meds and uses Tums/ Rolaids/ Maalox etc...  ~  EGD 10/10 by DrGessner showed severe gastritis, norm esoph, norm duodenum, neg HPylori.  ~  pt referred to [facility] by [employer] but cancelled when all her symptoms improved on DEXILANT 60mg /d.  IBS (ICD-564.1) - colonoscopy 3/93 by DrSam was WNL...symptoms were felt to be IBS...  ~  CTAbd 8/06 showed fatty liver, neg abd, s/p hyst, left ovarian cyst, norm right ovary...  ~  Colonoscopy 3/10 by drGessner showed 7mm hyperplastic polyp, hems, otherw neg... f/u planned 91yrs.  ~  5/10:  f/u DrGessner w/ IBS/ gas/ alt bowel habits- given ALIGN & BENTYL...  LIVER FUNCTION TESTS, ABNORMAL (ICD-794.8) - she had second GI opinion and eval of Abn LFT's by DrBuccini in 2002... he felt she had Fatty Liver Disease... she's been counselled to lose weight/ low fat diet/ incr exercise...  ~  labs 7/09 showed SGOT= 45, SGPT= 65  ~  labs 3/10 showed SGOT= 48, SGPT= 64  ~  labs 7/10 showed SGOT= 59, SGPT= 77  ~  labs 1/11 showed SGOT= 42, SGPT= 50  FIBROMYALGIA (ICD-729.1) - currently on SKELAXIN 800mg Tid & MOBIC 15mg  as needed... over the years she has seen DrTruslow, DrZeminski, & DrDeveshwar for Rheum opinions... she has also been treated by DrWillen/chiropractor... she has tried Flexeril, DCN100, Vicodin, Mobic, Skelaxin...  ~  11/11:  she requests refill of Vicodin, Mobic, Mirapex...  HEADACHE, CHRONIC (ICD-784.0) - on VICODIN limited to 3/d no early refills... she has been eval by Dr Meryl Crutch & DrWeymann for Neurology- they rec the Wellbutrin & Celexa... [place] w/ DrHaworth for c/o numbness & tingling- (***of note he recorded that her ROS was pos for every question that he asked her***)... he rec MRI Brain, NCV's, and extensive lab work- we  never recieved a follow up note after the initial visit... prev MRI from Neuro in [place] was WNL in the 1990's...  ~  9/10: eval by DrReynolds- he thought Migraines & Rx w/ MAXALT...  ~  6/11:  f/u Neuro w/ DrReynolds- continue Maxalt & add NORTRIPTYLINE 25mg  Qhs prophylaxis.  PERIODIC LIMB MOVEMENT DISORDER (ICD-327.51) - she had a prev sleep study 1/04 w/ signif periodic limb movements (leg jerks) w/ sleep disruption (study showed RDI= 1, but PLM index was 11- mostly arousals)... tried on MIRAPEX 0.25mg - 2 Qhs but she didn't continue> asked for refill Rx 11/11...  ANXIETY (ICD-300.00) - on ALPRAZOLAM 0.5mg Qid- "it's the only thing that helps".  DEPRESSION (ICD-311) - on WELLBUTRIN XL 300mg /d,  & CELEXA  40mg /d... she has seen a psychiatrist prev but she can't remember who it was...   Preventive Screening-Counseling & Management  Alcohol-Tobacco     Smoking Status: current     Smoking Cessation Counseling: yes     Smoke Cessation Stage: precontemplative     Packs/Day: 1.0  Caffeine-Diet-Exercise     Diet Counseling: to improve diet; diet is suboptimal     Does Patient Exercise: no  Allergies (verified): No Known Drug Allergies  Comments:  Nurse/Medical Assistant: The patient's medications and allergies were reviewed with the patient and were updated in the Medication and Allergy Lists.  Past History:  Past Medical History: GLOBUS (ICD-300.11) Hx of TMJ PAIN (ICD-524.62) CIGARETTE SMOKER (ICD-305.1) ? of MITRAL VALVE PROLAPSE (ICD-424.0) CHEST PAIN (ICD-786.50) HYPERCHOLESTEROLEMIA (ICD-272.0) HYPOTHYROIDISM (ICD-244.9) OBESITY, MILD (ICD-278.02) GERD (ICD-530.81) DYSPHAGIA UNSPECIFIED (ICD-787.20) IBS (ICD-564.1) LIVER FUNCTION TESTS, ABNORMAL (ICD-794.8) FIBROMYALGIA (ICD-729.1) HEADACHE, CHRONIC (ICD-784.0) PERIODIC LIMB MOVEMENT DISORDER (ICD-327.51) ANXIETY (ICD-300.00) DEPRESSION (ICD-311)  Past Surgical History: S/P ELap w/ lysis of adhesions & excision of  hydatid cyst left tube 1992 S/P supracervical hysterectomy  by DrFore in 2002 Tonsillectomy Arm Surgery-right Wrist surgery-right Foot surgery-Left  Family History: Reviewed history from 04/20/2009 and no changes required. mother alive age 47  hx of chol. and heart problems father alive age 31  hx of DM and heart problems 1 brother alive age 5 1 sister alive age 79 1 sister alive age 58 Family History of Colon Cancer: Paternal Aunt Family History of Breast Cancer: Maternal Aunt x 2 Family History of Stomach Cancer: Paternal Aunt, maternal grandmother Family History of Diabetes: Father, aunt x 2, uncles x 2 Family History of Heart Disease: Grandmother, Father, Aunt, Emelia Loron, Grandmother (paternal and maternal) Family History of Kidney Disease: cousin no thyroid dz  Social History: Reviewed history from 10/19/2008 and no changes required. disabled smoker -1 ppd married 2 children Alcohol Use - yes-occasionally Daily Caffeine Use-5 glasses a day Illicit Drug Use - no Patient does not get regular exercise.   Review of Systems      See HPI       The patient complains of dyspnea on exertion, headaches, abdominal pain, severe indigestion/heartburn, muscle weakness, difficulty walking, and depression.  The patient denies anorexia, fever, weight loss, weight gain, vision loss, decreased hearing, hoarseness, chest pain, syncope, peripheral edema, prolonged cough, hemoptysis, melena, hematochezia, hematuria, incontinence, suspicious skin lesions, transient blindness, unusual weight change, abnormal bleeding, enlarged lymph nodes, and angioedema.    Vital Signs:  Patient profile:   50 year old female Height:      67 inches Weight:      183.25 pounds BMI:     28.80 O2 Sat:      99 % on Room air Temp:     98.1 degrees F oral Pulse rate:   80 / minute BP sitting:   122 / 74  (left arm) Cuff size:   regular  Vitals Entered By: Randell Loop CMA (December 14, 2009 3:16 PM)  O2  Sat at Rest %:  99 O2 Flow:  Room air CC: 6 month ROV & review of mult medical problems... Is Patient Diabetic? No Pain Assessment Patient in pain? yes      Onset of pain  leg pain all the time Comments meds updated today with pt   Physical Exam  Additional Exam:  WD, Overweight, 50 y/o WF in NAD... GENERAL:  Alert, oriented & cooperative... HEENT:  [place]/AT, EOM-wnl, PERRLA, EACs-clear, TMs-wnl, NOSE-clear, THROAT- sl red... NECK:  Supple w/ fairROM; no JVD; normal carotid impulses w/o bruits; no thyromegaly or nodules palpated; no lymphadenopathy. CHEST:  Clear to P & A; without wheezes/ rales/ or rhonchi heard... HEART:  Regular Rhythm; without murmurs/ rubs/ or gallops detected... ABDOMEN:  Soft & nontender; normal bowel sounds; no organomegaly or masses detected. EXT: without deformities or arthritic changes; no varicose veins/ venous insuffic/ or edema... she has mult diffuse trigger points... NEURO:  CN's intact; no focal neuro deficits... DERM:  no lesions, no rash noted...    Impression & Recommendations:  Problem # 1:  FIBROMYALGIA (ICD-729.1) She has mult somatic complaints w/o much change over the years despite numerous meds & many subspecialist evals... she want refill today for Vicodin, Mobic, Wellbutrin, Celexa, Alpraz, Mirapex... The following medications were removed from the medication list:    Skelaxin 800 Mg Tabs (Metaxalone) .Marland Kitchen... 1 by mouth three times a day as needed for muscle spasm... Her updated medication list for this problem includes:    Vicodin 5-500 Mg Tabs (Hydrocodone-acetaminophen) .Marland Kitchen... 1 by mouth every 6-8 hours as needed  no early refiils  do not exceed 3  tabs per day    Mobic 15 Mg Tabs (Meloxicam) .Marland Kitchen... 1 by mouth once daily as needed joint pain  Problem # 2:  CIGARETTE SMOKER (ICD-305.1) She continues 1ppd despite the Wellbutrin Rx... not interested in smoking cessation counselling, Chantix, nicotine replacement rx etc...  Problem # 3:   CHEST PAIN UNSPECIFIED (ICD-786.50) She was eval by Walker Kehr for Cards... all objective testing neg.  Problem # 4:  HYPERCHOLESTEROLEMIA (ICD-272.0) She has severe hypercholesterolemia & needs Lipid clinic involvement> option= refer to [facility] The following medications were removed from the medication list:    Livalo 2 Mg Tabs (Pitavastatin calcium) .Marland Kitchen... Take 1/2 to 1 tab by mouth at bedtime for cholesterol...  Orders: Misc. Referral (Misc. Ref)  Problem # 5:  HYPOTHYROIDISM (ICD-244.9) Advised to take meds every day! Her updated medication list for this problem includes:    Levothroid 150 Mcg Tabs (Levothyroxine sodium) .Marland Kitchen... Take 1 tablet by mouth once a day  Problem # 6:  OBESITY, MILD (ICD-278.02) We discussed diet + exercise...  Problem # 7:  GERD (ICD-530.81) Advised to continue PPI Rx daily as well... Her updated medication list for this problem includes:    Dexilant 60 Mg Cpdr (Dexlansoprazole) .Marland Kitchen... Take 1 cap by mouth once daily- 30 min before the 1st meal of the day...  Problem # 8:  HEADACHE, CHRONIC (ICD-784.0) Note from DrReynolds reviewed w/ pt... Her updated medication list for this problem includes:    Vicodin 5-500 Mg Tabs (Hydrocodone-acetaminophen) .Marland Kitchen... 1 by mouth every 6-8 hours as needed  no early refiils  do not exceed 3  tabs per day    Mobic 15 Mg Tabs (Meloxicam) .Marland Kitchen... 1 by mouth once daily as needed joint pain    Maxalt-mlt 10 Mg Tbdp (Rizatriptan benzoate) .Marland Kitchen... Place one tab under tongue as needed for migraine headache...  Problem # 9:  DEPRESSION (ICD-311) She wants refill all meds... Her updated medication list for this problem includes:    Wellbutrin Xl 300 Mg Tb24 (Bupropion hcl) .Marland Kitchen... Take 1 tablet by mouth once a day    Celexa 40 Mg Tabs (Citalopram hydrobromide) .Marland Kitchen... Take 1 tablet by mouth once a day    Alprazolam 0.5 Mg Tabs (Alprazolam) .Marland Kitchen... As needed four times a day  not to exceed 4 tabs per day and no early refills  Complete Medication  List: 1)  Levothroid  150 Mcg Tabs (Levothyroxine sodium) .... Take 1 tablet by mouth once a day 2)  Dexilant 60 Mg Cpdr (Dexlansoprazole) .... Take 1 cap by mouth once daily- 30 min before the 1st meal of the day... 3)  Vicodin 5-500 Mg Tabs (Hydrocodone-acetaminophen) .Marland Kitchen.. 1 by mouth every 6-8 hours as needed  no early refiils  do not exceed 3  tabs per day 4)  Mobic 15 Mg Tabs (Meloxicam) .Marland Kitchen.. 1 by mouth once daily as needed joint pain 5)  Maxalt-mlt 10 Mg Tbdp (Rizatriptan benzoate) .... Place one tab under tongue as needed for migraine headache... 6)  Wellbutrin Xl 300 Mg Tb24 (Bupropion hcl) .... Take 1 tablet by mouth once a day 7)  Celexa 40 Mg Tabs (Citalopram hydrobromide) .... Take 1 tablet by mouth once a day 8)  Alprazolam 0.5 Mg Tabs (Alprazolam) .... As needed four times a day  not to exceed 4 tabs per day and no early refills 9)  Zovirax 5 % Oint (Acyclovir) .... Apply to fever blister every 3-4 hours as needed 10)  Anusol-hc 2.5 % Crea (Hydrocortisone) .... Apply after each bowel movement... 11)  Mirapex 0.25 Mg Tabs (Pramipexole dihydrochloride) .... Take 2 tabs by mouth qhs for legs...  Other Orders: Influenza Vaccine MCR (16109) Surgical Referral (Surgery)  Patient Instructions: 1)  Today we updated your med list- see below.... 2)  We refilled your meds for the next 6 months... 3)  We will set up referrals to the Lipid Clinic & to DrThompson at [facility].Marland KitchenMarland Kitchen 4)  Be sure to follow up w/ DrKramer as well... 5)  Please schedule a follow-up appointment in 6 months. Prescriptions: MIRAPEX 0.25 MG TABS (PRAMIPEXOLE DIHYDROCHLORIDE) take 2 tabs by mouth Qhs for legs...  #60 x 6   Entered and Authorized by:   [name] MD   Signed by:   [name] MD on 12/14/2009   Method used:   Print then Give to Patient   RxID:   [id number] ANUSOL-HC 2.5 % CREA (HYDROCORTISONE) apply after each bowel movement...  #1 tube x prn   Entered and Authorized by:   [name] MD    Signed by:   [name] MD on 12/14/2009   Method used:   Print then Give to Patient   RxID:   [id number] ALPRAZOLAM 0.5 MG  TABS (ALPRAZOLAM) as needed four times a day  not to exceed 4 tabs per day and no early refills  #120 x 6   Entered and Authorized by:   [name] MD   Signed by:   [name] MD on 12/14/2009   Method used:   Print then Give to Patient   RxID:   [id number] CELEXA 40 MG  TABS (CITALOPRAM HYDROBROMIDE) Take 1 tablet by mouth once a day  #30 x 6   Entered and Authorized by:   [name] MD   Signed by:   [name] MD on 12/14/2009   Method used:   Print then Give to Patient   RxID:   [id number] WELLBUTRIN XL 300 MG  TB24 (BUPROPION HCL) Take 1 tablet by mouth once a day  #30 x 6   Entered and Authorized by:   [name] MD   Signed by:   [name] MD on 12/14/2009   Method used:   Print then Give to Patient   RxID:   [id number] MOBIC 15 MG TABS (MELOXICAM) 1 by mouth once daily as  needed joint pain  #30 x 6   Entered and Authorized by:   [name] MD   Signed by:   [name] MD on 12/14/2009   Method used:   Print then Give to Patient   RxID:   [id number] VICODIN 5-500 MG  TABS (HYDROCODONE-ACETAMINOPHEN) 1 by mouth every 6-8 hours as needed  NO EARLY REFIILS  DO NOT EXCEED 3  TABS PER DAY  #90 x 6   Entered and Authorized by:   [name] MD   Signed by:   [name] MD on 12/14/2009   Method used:   Print then Give to Patient   RxID:   [id number] DEXILANT 60 MG CPDR (DEXLANSOPRAZOLE) take 1 cap by mouth once daily- 30 min before the 1st meal of the day...  #30 x 6   Entered and Authorized by:   [name] MD   Signed by:   [name] MD on 12/14/2009   Method used:   Print then Give to Patient   RxID:   [id number] LEVOTHROID 150 MCG TABS (LEVOTHYROXINE SODIUM) Take 1 tablet by mouth once a day  #30 x 6   Entered and Authorized by:   [name] MD   Signed by:   [name] MD on 12/14/2009   Method used:   Print then Give to Patient   RxID:   [id number]    Immunizations Administered:  Influenza Vaccine # 1:    Vaccine Type: Fluvax MCR    Site: left deltoid    Mfr: NOVARTIS    Dose: 0.5 ml    Route: IM    Given by: Randell Loop CMA    Exp. Date: 07/2010    Lot #: [id number]    VIS given: 08/31/09 version given December 14, 2009.  Flu Vaccine Consent Questions:    Do you have a history of severe allergic reactions to this vaccine? no    Any prior history of allergic reactions to egg and/or gelatin? no    Do you have a sensitivity to the preservative Thimersol? no    Do you have a past history of Guillan-Barre Syndrome? no    Do you currently have an acute febrile illness? no    Have you ever had a severe reaction to latex? no    Vaccine information given and explained to patient? yes    Are you currently pregnant? no

## 2010-03-08 NOTE — Progress Notes (Signed)
Summary: fever blister  Phone Note Call from Patient   Caller: Patient Call For: Ferne Ellingwood Summary of Call: pt need something for fever blister [facility] Initial call taken by: Rickard Patience,  August 06, 2009 11:03 AM  Follow-up for Phone Call        Please advise.Michel Bickers CMA  August 06, 2009 11:12 AM  per SN---ok for acyclovir 800mg   #40  1 by mouth every 4 hours as needed until gone---called and spoke with pt and told her that this med had been sent to her pharmacy---she stated that she needed the zovirax ointment 5% cream--apply to fever blister every 3-4 hours as needed ---told pharmcy to disregard the rx for acyclovir. Randell Loop CMA  August 06, 2009 3:53 PM     New/Updated Medications: ZOVIRAX 5 % OINT (ACYCLOVIR) apply to fever blister every 3-4 hours as needed Prescriptions: ZOVIRAX 5 % OINT (ACYCLOVIR) apply to fever blister every 3-4 hours as needed  #1tube x 1   Entered by:   Randell Loop CMA   Authorized by:   [name] MD   Signed by:   Randell Loop CMA on 08/06/2009   Method used:   Electronically to        [facility] (retail)       [address]       [place], [place]  [address]       Ph: [phone]       Fax: [phone]   RxID:   [id number]

## 2010-03-08 NOTE — Assessment & Plan Note (Signed)
Summary: rov/MVP      Allergies Added: NKDA  Primary Provider:  Teressa Lower, MD   History of Present Illness: Tasha [name] is een today for F/U of SSCP.  The pain is atypical.  She had a normal myovue in 09/2008  She has significant fibromylalgia.  Nothing she does improves the pain.  There is no radiatin to the arms but sometimes she can just ache all over.  Despit 1ppd smoking she does not cough.  Her foot pain limits her activity before she notes any marked SoB. There is a history of MVP but this was not seen on previous echos.She also complains about palpitations.  They are weekly with fliip flopping no prolonged sustained tachycardia.  She feels her heart stops beating for a perirod of time but there has been no syncpe  The palpitaitons occur mostly in the morning  Her cholesterol was over 300.  She has had myalgias with ? xocor and crestor.  She will see the lipid clinic today.  Current Medications (verified): 1)  Synthroid 200 Mcg  Tabs (Levothyroxine Sodium) .Marland Kitchen.. 1 By Mouth Once Daily 2)  Dexilant 60 Mg Cpdr (Dexlansoprazole) .... Take 1 Cap By Mouth Once Daily- 30 Min Before The 1st Meal of The Day... 3)  Vicodin 5-500 Mg  Tabs (Hydrocodone-Acetaminophen) .Marland Kitchen.. 1 By Mouth Every 6-8 Hours As Needed  No Early Refiils  Do Not Exceed 3  Tabs Per Day 4)  Mobic 15 Mg Tabs (Meloxicam) .Marland Kitchen.. 1 By Mouth Once Daily As Needed Joint Pain 5)  Skelaxin 800 Mg  Tabs (Metaxalone) .Marland Kitchen.. 1 By Mouth Three Times A Day As Needed For Muscle Spasm... 6)  Wellbutrin Xl 300 Mg  Tb24 (Bupropion Hcl) .... Take 1 Tablet By Mouth Once A Day 7)  Celexa 40 Mg  Tabs (Citalopram Hydrobromide) .... Take 1 Tablet By Mouth Once A Day 8)  Alprazolam 0.5 Mg  Tabs (Alprazolam) .... As Needed Four Times A Day  Not To Exceed 4 Tabs Per Day and No Early Refills 9)  Zovirax 5 % Oint (Acyclovir) .... Apply To Fever Blister Every 3-4 Hours 10)  Tums 500 Mg Chew (Calcium Carbonate Antacid) .... Take As Needed 11)  Rolaids 334 Mg Chew  (Dihydroxyaluminum Sod Carb) .... Take As Needed 12)  Maalox 600 Mg Chew (Calcium Carbonate Antacid) .... Take As Needed 13)  Zithromax Z-Pak 250 Mg Tabs (Azithromycin) .... Take As Directed  Allergies (verified): No Known Drug Allergies  Past History:  Past Medical History: Last updated: 02/04/2009  GLOBUS (ICD-300.11) Hx of TMJ PAIN (ICD-524.62) CIGARETTE SMOKER (ICD-305.1) ? of MITRAL VALVE PROLAPSE (ICD-424.0) CHEST PAIN (ICD-786.50) HYPERCHOLESTEROLEMIA (ICD-272.0) HYPOTHYROIDISM (ICD-244.9) OBESITY, MILD (ICD-278.02) GERD (ICD-530.81) DYSPHAGIA UNSPECIFIED (ICD-787.20) IBS (ICD-564.1) LIVER FUNCTION TESTS, ABNORMAL (ICD-794.8) FIBROMYALGIA (ICD-729.1) HEADACHE, CHRONIC (ICD-784.0) PERIODIC LIMB MOVEMENT DISORDER (ICD-327.51) ANXIETY (ICD-300.00) DEPRESSION (ICD-311)  Past Surgical History: Last updated: 02/04/2009 S/P ELap w/ lysis of adhesions & excision of hydatid cyst left tube 1992 S/P supracervical hysterectomy  by DrFore in 2002 Tonsillectomy Arm Surgery-right Wrist surgery-right Foot surgery-Left  Family History: Last updated: 06/24/2008 mother alive age 71  hx of chol. and heart problems father alive age 13  hx of DM and heart problems 1 brother alive age 90 1 sister alive age 72 1 sister alive age 65 Family History of Colon Cancer: Paternal Aunt Family History of Breast Cancer: Maternal Aunt x 2 Family History of Stomach Cancer: Paternal Aunt, maternal grandmother Family History of Diabetes: Father, aunt x 2, uncles x 2 Family  History of Heart Disease: Grandmother, Father, Elenor Legato, Jon Gills, Grandmother (paternal and maternal) Family History of Kidney Disease: cousin  Social History: Last updated: 10/19/2008 disabled smoker -1 ppd married 2 children Alcohol Use - yes-occasionally Daily Caffeine Use-5 glasses a day Illicit Drug Use - no Patient does not get regular exercise.   Risk Factors: Exercise: no (04/06/2008)  Risk  Factors: Smoking Status: current (11/22/2006) Packs/Day: 1 (11/22/2006)  Review of Systems       Denies fever, malais, weight loss, blurry vision, decreased visual acuity, cough, sputum, SOB, hemoptysis, pleuritic pain,  heartburn, abdominal pain, melena, lower extremity edema, claudication, or rash.  Vital Signs:  Patient profile:   50 year old female Height:      67 inches Weight:      190 pounds BMI:     29.87 Pulse rate:   75 / minute BP sitting:   136 / 80  (left arm) Cuff size:   regular  Vitals Entered By: Mignon Pine, RMA (February 25, 2009 8:28 AM)  Physical Exam  General:  Affect appropriate Healthy:  appears stated age 17: normal Neck supple with no adenopathy JVP normal no bruits no thyromegaly Lungs clear with no wheezing and good diaphragmatic motion Heart:  S1/S2 no murmur,rub, gallop or click PMI normal Abdomen: benighn, BS positve, no tenderness, no AAA no bruit.  No HSM or HJR Distal pulses intact with no bruits No edema Neuro non-focal Skin warm and dry   Impression & Recommendations:  Problem # 1:  HYPERLIPIDEMIA (ICD-272.4)  Start CoEnQ and F/U lipid clinic today.  Would try different statin at low dose.  Suspect some of her "aching" is from fibromyalgia Instructed on Atkins/[place] type diet  Problem # 2:  ? of MITRAL VALVE PROLAPSE (ICD-424.0) No murmur.  No need for SBE prophylaxis.    Problem # 3:  PALPITATIONS, RECURRENT (ICD-785.1)  Event monior.  Wtth normal myovue in 09/2008 and nomrla LV funciton doubt sig arrythmia  Orders: Event (Event)  Problem # 4:  CHEST PAIN UNSPECIFIED (ICD-786.50) Recurrent atypical likely related to anxiety and fibromyalgia.  Normal myovue 09/2008  Patient Instructions: 1)  Your physician has recommended that you wear an event monitor.  Event monitors are medical devices that record the heart's electrical activity. Doctors most often use these monitors to diagnose arrhythmias. Arrhythmias are  problems with the speed or rhythm of the heartbeat. The monitor is a small, portable device. You can wear one while you do your normal daily activities. This is usually used to diagnose what is causing palpitations/syncope (passing out). 2)  Your physician wants you to follow-up in:  6 months with Dr Jenkins Rouge.  You will receive a reminder letter in the mail two months in advance. If you don't receive a letter, please call our office to schedule the follow-up appointment.

## 2010-03-08 NOTE — Assessment & Plan Note (Signed)
Summary: 3 month rov per sn//td   Primary Care Provider:  Alroy Dust, MD  CC:  4 month ROV & review of mult medical problems...  History of Present Illness: 50 y/o Tasha [name] here for a follow up visit... she has multiple medical problems as noted below...    ~  Jul09:  since I saw her last in Dec08 she has persisted w/ her complaints of fatigue, aches, pains, and soreness all over- characteristic of her Fibromyalgia... she was prev eval by DrDeveshwar for Rheum but hasn't had a follow up in some time and she has stopped the Flexeril med... she continues to smoke 1ppd and isn't interested in smoking cessation help... she states that she's depressed and has prev seen a psychiatrist but can't remember who it was... she has mult somatic complaints and notes dysphagia and wants a GI referral to evaluate this further...    ~  Dec10:  17 month ROV w/ mult complaints- headaches; heartburn; palpitations; pain in hips/ legs, feet; incr fatigue; ?black-outs that improve w/ Xanax... she has mult specialists tending to her multisys complaints- see below.   ~  Jun 14, 2009:  she has been to the lipid clinic and tried on Lipitor but INTOL w/ leg pain & asking for my suggestion> we discussed the lowest dose pill avail= Livalo & she will try it...  she saw DrEllison for Endocrine w/ hypothyroidism> he incr her dose of Synthroid from 125 to 150 but she didn't like how she felt on this dose so she decr back to 1103mcg/d on her own (the main prob over the yrs was medication compliance & hard to guess her dose when taking intermittently)...  DrGessner had referred her to [facility] for GI consult but she tells me she cancelled the appt when her symptoms resolved on the dexilant Rx that we perscribed...  finally c/o reurrent HA's and visual symptoms c/w migraines>  prev eval DrReynolds & Rx w/ Maxalt...    Current Problem List:  Hx of TMJ PAIN (ICD-524.62) + GLOBUS Phenomenon believed related to GERD...  ~  9/10: ENT eval by  Darlin Priestly (referred by DrSMiller)- Globus likely due to reflux, continue PPI Bid, but she only takes intermittently...  CIGARETTE SMOKER (ICD-305.1) - she continues to smoke 1ppd... not interested in smoking cessation help.  ~  CXR 9/10 showed norm heart size, clear lungs, NAD.Marland KitchenMarland Kitchen  ? of MITRAL VALVE PROLAPSE (ICD-424.0) - followed by Walker Kehr for Cards... she has intermittent CP (likely related to her FM), and palpitations that continue to be a problem for her- advised to quit nicotine & caffeine...   ~  2DEcho 10/06 was normal without MVP seen, no MR, norm LVF... prev 2D in 1990 w/ mild bowing of ant leaflet c/w mild MVP at that time...  ~  NuclearStressTest 2/02 was WNL- no ischemia, no infarction, EF=73%...  ~  Cardiac eval 9/10 by Walker Kehr for atyp CP- NuclearStressTest was normal- EF=Tasha%...  HYPERCHOLESTEROLEMIA (ICD-272.0) - hx of Chol values >300 in the past (esp when thyroid was underactive)... she has been intolerant to numerous statin meds and inconsistant w/ f/u labs... currently on "diet therapy"...  ~  FLP 7/09 showed TChol 301, TG 284, HDL 32, LDL 210... referred to [facility], tried on CRESTOR5 but she stopped due to leg pain.  ~  FLP 7/10 showed TChol 318, TG 318, HDL 32, LDL 228... given [id number], but stopped this on her own... rec> return to [facility]id[facility]inic.  ~  FLP 1/11 showed TChol 283, TG  271, HDL 32, LDL 212  ~  Lipid Clinic 1/11> her only visit w/ clinic... she reports trial LIPITOR but intol w/ leg pain & never ret to clinic.  ~  5/11:  we discussed trial of the lowest dose statin avail> LIVALO 2mf samples start 1/2 tab Qhs.  HYPOTHYROIDISM (ICD-244.9) - on SYNTHROID 118mcg/d... hypothyroidism initially diagnosed in the 1980's and she's been on meds ever since then...   ~  labs 12/08 showed TSH= 31 ? off her meds... rec to restart her Synthroid regularly...  ~  labs 7/09 on Levo200 showed TSH= 2.24  ~  labs 3/10 on Levo200 showed TSH= 0.95  ~  labs 7/10 on Levo200 showed  TSH= 4.22  ~  labs 1/11 showed TSH= 10.95... pt reminded to take Synthroid dose 131mcg/d regularly.  ~  3/11: pt saw DrEllison for consult & Synthroid incr to 169mcg/d but she "didn't feel right" on this & ret to 1104mcg/d.  GERD (ICD-530.81) & DYSPHAGIA UNSPECIFIED (ICD-787.20) - prev GI eval by DrSamLeBauer- now sees DrGessner & was advised to quit smoking & caffeine, +given Aciphex & Carafate but she stopped both meds and uses Tums/ Rolaids/ Maalox etc...  ~  EGD 10/10 by DrGessner showed severe gastritis, norm esoph, norm duodenum, neg HPylori.  ~  pt referred to [facility] by [employer] but cancelled when all her symptoms improved on DEXILANT 60mg /d.  IBS (ICD-564.1) - colonoscopy 3/93 by DrSam was WNL...symptoms were felt to be IBS...  ~  CTAbd 8/06 showed fatty liver, neg abd, s/p hyst, left ovarian cyst, norm right ovary...  ~  Colonoscopy 3/10 by drGessner showed 7mm hyperplastic polyp, hems, otherw neg... f/u planned 37yrs.  ~  5/10:  f/u DrGessner w/ IBS/ gas/ alt bowel habits- given ALIGN & BENTYL...  LIVER FUNCTION TESTS, ABNORMAL (ICD-794.8) - she had second GI opinion and eval of Abn LFT's by DrBuccini in 2002... he felt she had Fatty Liver Disease... she's been counselled to lose weight/ low fat diet/ incr exercise...  ~  labs 7/09 showed SGOT= 45, SGPT= 65  ~  labs 3/10 showed SGOT= 48, SGPT= 64  ~  labs 7/10 showed SGOT= 59, SGPT= 77  ~  labs 1/11 showed SGOT= 42, SGPT= 50  FIBROMYALGIA (ICD-729.1) - currently on SKELAXIN 800mg Tid & MOBIC 15mg  as needed... over the years she has seen DrTruslow, DrZeminski, & DrDeveshwar for Rheum opinions... she has also been treated by DrWillen/chiropractor... she has tried Flexeril, DCN100, Vicodin, now Skelaxin...   HEADACHE, CHRONIC (ICD-784.0) - on VICODIN limited to 3/d no early refills... she has been eval by Dr Meryl Crutch & DrWeymann for Neurology- they rec the Wellbutrin & Celexa... prev [place] w/ DrHaworth for c/o numbness &  tingling- (***of note he recorded that her ROS was pos for every question that he asked her***)... he rec MRI Brain, NCV's, and extensive lab work- we never recieved a follow up note after the initial visit... prev MRI from Neuro in [place] was WNL in the 1990's...  ~  9/10: eval by DrReynolds- he thought Migraines & Rx w/ MAXALT...  PERIODIC LIMB MOVEMENT DISORDER (ICD-327.51) - she had a prev sleep study 1/04 w/ signif periodic limb movements (leg jerks) w/ sleep disruption (study showed RDI= 1, but PLM index was 11- mostly arousals)... tried on MIRAPEX 0.25mg - 2 Qhs but she didn't continue.  ANXIETY (ICD-300.00) - on ALPRAZOLAM 0.5mg Qid- "it's the only thing that helps".  DEPRESSION (ICD-311) - on WELLBUTRIN XL 300mg /d,  & CELEXA  40mg /d... she has seen a psychiatrist prev but she can't remember who it was...   Allergies (verified): No Known Drug Allergies  Comments:  Nurse/Medical Assistant: The patient's medications and allergies were reviewed with the patient and were updated in the Medication and Allergy Lists.  Past History:  Past Medical History: GLOBUS (ICD-300.11) Hx of TMJ PAIN (ICD-524.62) CIGARETTE SMOKER (ICD-305.1) ? of MITRAL VALVE PROLAPSE (ICD-424.0) CHEST PAIN (ICD-786.50) HYPERCHOLESTEROLEMIA (ICD-272.0) HYPOTHYROIDISM (ICD-244.9) OBESITY, MILD (ICD-278.02) GERD (ICD-530.81) DYSPHAGIA UNSPECIFIED (ICD-787.20) IBS (ICD-564.1) LIVER FUNCTION TESTS, ABNORMAL (ICD-794.8) FIBROMYALGIA (ICD-729.1) HEADACHE, CHRONIC (ICD-784.0) PERIODIC LIMB MOVEMENT DISORDER (ICD-327.51) ANXIETY (ICD-300.00) DEPRESSION (ICD-311)  Past Surgical History: S/P ELap w/ lysis of adhesions & excision of hydatid cyst left tube 1992 S/P supracervical hysterectomy  by DrFore in 2002 Tonsillectomy Arm Surgery-right Wrist surgery-right Foot surgery-Left  Family History: Reviewed history from 04/20/2009 and no changes required. mother alive age 46  hx of chol. and heart  problems father alive age 21  hx of DM and heart problems 1 brother alive age 19 1 sister alive age 33 1 sister alive age 40 Family History of Colon Cancer: Paternal Aunt Family History of Breast Cancer: Maternal Aunt x 2 Family History of Stomach Cancer: Paternal Aunt, maternal grandmother Family History of Diabetes: Father, aunt x 2, uncles x 2 Family History of Heart Disease: Grandmother, Father, Aunt, Emelia Loron, Grandmother (paternal and maternal) Family History of Kidney Disease: cousin no thyroid dz  Social History: Reviewed history from 10/19/2008 and no changes required. disabled smoker -1 ppd married 2 children Alcohol Use - yes-occasionally Daily Caffeine Use-5 glasses a day Illicit Drug Use - no Patient does not get regular exercise.   Review of Systems      See HPI       The patient complains of chest pain, dyspnea on exertion, headaches, abdominal pain, severe indigestion/heartburn, muscle weakness, and transient blindness.  The patient denies anorexia, fever, weight loss, weight gain, vision loss, decreased hearing, hoarseness, syncope, peripheral edema, prolonged cough, hemoptysis, melena, hematochezia, hematuria, incontinence, suspicious skin lesions, difficulty walking, depression, unusual weight change, abnormal bleeding, enlarged lymph nodes, and angioedema.    Vital Signs:  Patient profile:   50 year old female Height:      67 inches Weight:      183 pounds BMI:     28.77 O2 Sat:      98 % on Room air Temp:     98.7 degrees F oral Pulse rate:   60 / minute BP sitting:   126 / 80  (left arm) Cuff size:   regular  Vitals Entered By: Randell Loop CMA (Jun 14, 2009 11:57 AM)  O2 Sat at Rest %:  98 O2 Flow:  Room air CC: 4 month ROV & review of mult medical problems.. Is Patient Diabetic? No Pain Assessment Patient in pain? no      Comments meds updated today--   Physical Exam  Additional Exam:  WD, Overweight, 50 y/o Tasha [name] in NAD... GENERAL:   Alert, oriented & cooperative... HEENT:  [place]/AT, EOM-wnl, PERRLA, EACs-clear, TMs-wnl, NOSE-clear, THROAT- sl red... NECK:  Supple w/ fairROM; no JVD; normal carotid impulses w/o bruits; no thyromegaly or nodules palpated; no lymphadenopathy. CHEST:  Clear to P & A; without wheezes/ rales/ or rhonchi heard... HEART:  Regular Rhythm; without murmurs/ rubs/ or gallops detected... ABDOMEN:  Soft & nontender; normal bowel sounds; no organomegaly or masses detected. EXT: without deformities or arthritic changes; no varicose veins/ venous insuffic/ or edema... she has mult  diffuse trigger points... NEURO:  CN's intact; no focal neuro deficits... DERM:  no lesions, no rash noted...    Impression & Recommendations:  Problem # 1:  CIGARETTE SMOKER (ICD-305.1) She must quit all smoking>  offered smoking cessation help but she declines...   Problem # 2:  PALPITATIONS, RECURRENT (ICD-785.1) She has some sharp CPs related to her FM, and poss her MVP... no recent palpit & advised to elim caffeine, nicotine, etc...  Problem # 3:  HYPERCHOLESTEROLEMIA (ICD-272.0) We discussed her options and she agreed to try another statin>  discussed trial of the lowest dose statin avail> LIVALO 2mg  start w/ 1/2 tab daily... Her updated medication list for this problem includes:    Livalo 2 Mg Tabs (Pitavastatin calcium) .Marland Kitchen... Take 1/2 to 1 tab by mouth at bedtime for cholesterol...  Problem # 4:  HYPOTHYROIDISM (ICD-244.9) Reminded to take the Synthroid every day & we will f/u TSH on ret if taking regularly... asked to bring all bottles to [facility] visit for review. Her updated medication list for this problem includes:    Levothyroxine Sodium 125 Mcg Tabs (Levothyroxine sodium) .Marland Kitchen... Take 1 tablet by mouth once a day  Problem # 5:  OBESITY, MILD (ICD-278.02) Diet + exercise discussed...  Problem # 6:  GERD (ICD-530.81) States symptoms improved w/ the Dexilant... Her updated medication list for this  problem includes:    Dexilant 60 Mg Cpdr (Dexlansoprazole) .Marland Kitchen... Take 1 cap by mouth once daily- 30 min before the 1st meal of the day...  Problem # 7:  FIBROMYALGIA (ICD-729.1) No change in her chronic pain symptoms... Her updated medication list for this problem includes:    Vicodin 5-500 Mg Tabs (Hydrocodone-acetaminophen) .Marland Kitchen... 1 by mouth every 6-8 hours as needed  no early refiils  do not exceed 3  tabs per day    Mobic 15 Mg Tabs (Meloxicam) .Marland Kitchen... 1 by mouth once daily as needed joint pain    Skelaxin 800 Mg Tabs (Metaxalone) .Marland Kitchen... 1 by mouth three times a day as needed for muscle spasm...  Problem # 8:  HEADACHE, CHRONIC (ICD-784.0) Needs refill Maxalt & f/u Neuro> DrReynolds... Her updated medication list for this problem includes:    Vicodin 5-500 Mg Tabs (Hydrocodone-acetaminophen) .Marland Kitchen... 1 by mouth every 6-8 hours as needed  no early refiils  do not exceed 3  tabs per day    Mobic 15 Mg Tabs (Meloxicam) .Marland Kitchen... 1 by mouth once daily as needed joint pain    Maxalt-mlt 10 Mg Tbdp (Rizatriptan benzoate) .Marland Kitchen... Place one tab under tongue as needed for migraine headache...  Orders: Neurology Referral (Neuro)  Problem # 9:  ANXIETY (ICD-300.00) She wants to continue same meds. Her updated medication list for this problem includes:    Wellbutrin Xl 300 Mg Tb24 (Bupropion hcl) .Marland Kitchen... Take 1 tablet by mouth once a day    Celexa 40 Mg Tabs (Citalopram hydrobromide) .Marland Kitchen... Take 1 tablet by mouth once a day    Alprazolam 0.5 Mg Tabs (Alprazolam) .Marland Kitchen... As needed four times a day  not to exceed 4 tabs per day and no early refills  Complete Medication List: 1)  Levothyroxine Sodium 125 Mcg Tabs (Levothyroxine sodium) .... Take 1 tablet by mouth once a day 2)  Dexilant 60 Mg Cpdr (Dexlansoprazole) .... Take 1 cap by mouth once daily- 30 min before the 1st meal of the day... 3)  Vicodin 5-500 Mg Tabs (Hydrocodone-acetaminophen) .Marland Kitchen.. 1 by mouth every 6-8 hours as needed  no early refiils  do not  exceed 3  tabs per day 4)  Mobic 15 Mg Tabs (Meloxicam) .Marland Kitchen.. 1 by mouth once daily as needed joint pain 5)  Skelaxin 800 Mg Tabs (Metaxalone) .Marland Kitchen.. 1 by mouth three times a day as needed for muscle spasm... 6)  Maxalt-mlt 10 Mg Tbdp (Rizatriptan benzoate) .... Place one tab under tongue as needed for migraine headache... 7)  Wellbutrin Xl 300 Mg Tb24 (Bupropion hcl) .... Take 1 tablet by mouth once a day 8)  Celexa 40 Mg Tabs (Citalopram hydrobromide) .... Take 1 tablet by mouth once a day 9)  Alprazolam 0.5 Mg Tabs (Alprazolam) .... As needed four times a day  not to exceed 4 tabs per day and no early refills 10)  Livalo 2 Mg Tabs (Pitavastatin calcium) .... Take 1/2 to 1 tab by mouth at bedtime for cholesterol...  Patient Instructions: 1)  Today we updated your med list- see below.... 2)  We wrote new perscriptions for the following: 3)  DEXILANT to take one daily- 30 min before the 1st meal of the day... 4)  SYNTHROID to take once daily (take every day).Marland KitchenMarland Kitchen 5)  LIVALO 2mg - new cholesterol pill & the lowest dose pill avail... start w/ 1/2 tab each night at bedtime and increase to 1 tab at bedtime after several weeks.Marland KitchenMarland Kitchen  6)  We will plan f/u FASTINg blood work in 3 months w/ ROV. Prescriptions: MAXALT-MLT 10 MG TBDP (RIZATRIPTAN BENZOATE) place one tab under tongue as needed for migraine headache...  #9 x prn   Entered and Authorized by:   [name] MD   Signed by:   [name] MD on 06/14/2009   Method used:   Print then Give to Patient   RxID:   [id number] LIVALO 2 MG TABS (PITAVASTATIN CALCIUM) take 1/2 to 1 tab by mouth at bedtime for cholesterol...  #30 x prn   Entered and Authorized by:   [name] MD   Signed by:   [name] MD on 06/14/2009   Method used:   Print then Give to Patient   RxID:   [id number] DEXILANT 60 MG CPDR (DEXLANSOPRAZOLE) take 1 cap by mouth once daily- 30 min before the 1st meal of the day...  #30 x prn   Entered and Authorized  by:   [name] MD   Signed by:   [name] MD on 06/14/2009   Method used:   Print then Give to Patient   RxID:   [id number] LEVOTHYROXINE SODIUM 125 MCG TABS (LEVOTHYROXINE SODIUM) Take 1 tablet by mouth once a day  #30 x prn   Entered and Authorized by:   [name] MD   Signed by:   [name] MD on 06/14/2009   Method used:   Print then Give to Patient   RxID:   [id number]

## 2010-03-08 NOTE — Assessment & Plan Note (Signed)
Summary: HA'S/TD   Chief Complaint:  headache....fatique...reviewed meds...  History of Present Illness: 50 year old female patient of Dr. Kriste Basque that has known history of hypothyroidism, chronic pain syndrome, fibromyalgia, chronic headache previous evaluation at headache clinic. Currently on Synthoid once daily recent labs showed thyroid still underactive with TSH at 20 and low T4 and T3 levels. Complains of fatigue and increased headaches request refill of darvocet. Use chronic pain meds on regular basis and has to call in for monthly refills. Denies chest pain, dyspnea, visual changes speech changes, edema.     Current Allergies (reviewed today): No known allergies  Updated/Current Medications (including changes made in today's visit):  WELLBUTRIN XL 300 MG  TB24 (BUPROPION HCL) Take 1 tablet by mouth once a day PREVACID 30 MG  CPDR (LANSOPRAZOLE) Take 1 tablet by mouth once a day CELEXA 40 MG  TABS (CITALOPRAM HYDROBROMIDE) Take 1 tablet by mouth once a day CYCLOBENZAPRINE HCL 10 MG  TABS (CYCLOBENZAPRINE HCL) take 1/2 to 1 tablet four times a day SYNTHROID 200 MCG  TABS (LEVOTHYROXINE SODIUM) 1 by mouth once daily [BMN] MIRAPEX 0.125 MG  TABS (PRAMIPEXOLE DIHYDROCHLORIDE) take 2 tablets daily ALPRAZOLAM 0.5 MG  TABS (ALPRAZOLAM) as needed four times a day ADVIL 200 MG  CAPS (IBUPROFEN) use as needed DARVOCET-N 100 100-650 MG  TABS (PROPOXYPHENE N-APAP) 1 by mouth every 4-6 hrs as needed headache XANAX 0.5 MG TABS (ALPRAZOLAM)  MIDRIN 325-65-100 MG  CAPS (APAP-ISOMETHEPTENE-DICHLORAL) once daily as needed headache   Past Medical History:        HEADACHE, CHRONIC, HX OF (ICD-V15.9)    OTHER MALAISE AND FATIGUE (ICD-780.79)    DEPRESSION (ICD-311)    FIBROMYALGIA (ICD-729.1)    HYPOTHYROIDISM (ICD-244.9)    LIVER FUNCTION TESTS, ABNORMAL (ICD-794.8)    IBS (ICD-564.1)    GERD (ICD-530.81)    MITRAL VALVE PROLAPSE (ICD-424.0)         Risk Factors: Tobacco use:   current   Review of Systems      See HPI   Vital Signs:  Patient Profile:   50 Years Old Female Height:     67 inches Weight:      191 pounds BMI:     30.02 O2 Sat:      97 % O2 treatment:    Room Air Temp:     97.2 degrees F oral Pulse rate:   57 / minute BP sitting:   120 / 70  (left arm) Cuff size:   regular  Pt. in pain?   no  Vitals Entered By: Clarise Cruz Duncan Dull) (March 13, 2007 10:48 AM)                  Physical Exam  No acute distress. VSS.  HEENT: unremarkable. Oropharynx is clear. TM nml, EAC clear Neck: supple, no adenopathy, or JVD, thyroid nonpalpable Lungs: CTA bilat. no wheezing/crackles Card: r/r/r, no m/r/g Abd: soft, nontender Ext: warm w/o calf tenderness, cyanosis clubbing, or edema.  Neuro: intact with no focal deficits  noted.       Impression & Recommendations:  Problem # 1:  HYPOTHYROIDISM (ICD-244.9) Increase Synthroid to once daily  follow up 1 month for recheck labs with Dr. Kriste Basque  Please contact office for sooner follow up if symptoms do not improve or worsen  Her updated medication list for this problem includes:    Synthroid 200 Mcg Tabs (Levothyroxine sodium) .Marland Kitchen... 1 by mouth once daily  Orders: Est. Patient Level IV (  244[address])   Problem # 2:  HEADACHE, CHRONIC, HX OF (ICD-V15.9) Stop Vicodin.  Use midrin as needed  for mild to moderate headache, and may have #30 of Darvocet N 100 for severe headache, no refills were given. Have recommend patient alternate b/t vicodin and darvocet. have recommend referral  back to headache clinic but patient refuses. May need referral to pain clinic. follow up 1 month and as needed Please contact office for sooner follow up if symptoms do not improve or worsen  Use Darvocet N-100 for severe headache. . Stress reducers. Warm heat to neck.  Orders: Est. Patient Level IV (02725)   Medications Added to Medication List This Visit: 1)  Synthroid 200 Mcg Tabs (Levothyroxine sodium)  .Marland Kitchen.. 1 by mouth once daily 2)  Darvocet-n 100 100-650 Mg Tabs (Propoxyphene n-apap) .Marland Kitchen.. 1 by mouth every 4-6 hrs as needed headache 3)  Midrin 325-65-100 Mg Caps (Apap-isometheptene-dichloral) .... Once daily as needed headache   Patient Instructions: 1)  Increase Synthroid to once daily  2)  follow up 1 month for recheck labs with Dr. Kriste Basque  3)  Please contact office for sooner follow up if symptoms do not improve or worsen  4)  Stop Vicodin  5)  Use Midrin for headache. and  6)  Use Darvocet N-100 every 4-6 hr severe headache,  7)  Warm heat to neck, stress reducers.     Prescriptions: DARVOCET-N 100 100-650 MG  TABS (PROPOXYPHENE N-APAP) 1 by mouth every 4-6 hrs as needed headache  #30 x o   Entered and Authorized by:   Rubye Oaks NP   Signed by:   Ramil Edgington NP on 03/13/2007   Method used:   Print then Give to Patient   RxID:   [id number] SYNTHROID 200 MCG  TABS (LEVOTHYROXINE SODIUM) 1 by mouth once daily Brand medically necessary #30 x 5   Entered and Authorized by:   Rubye Oaks NP   Signed by:   Sanai Frick NP on 03/13/2007   Method used:   Electronically sent to ...       [facility]. [phone]*       [address]       [place], [place]  [address]       Ph: [phone] or [phone]       Fax: [phone]   RxID:   [id number]  ]

## 2010-03-08 NOTE — Progress Notes (Signed)
Summary: rx for Xanax and Vicodin  Phone Note Call from Patient Call back at Home Phone [phone]   Caller: Patient Call For: nadel Summary of Call: pt says she has been waiting 5 days for her refills. wants to speak to nurse. [facility] [address] Initial call taken by: Tivis Ringer, CNA,  November 03, 2009 12:17 PM  Follow-up for Phone Call        ok for pt to have refills on these meds until nov---so 2 refills but she must make appt with Dr. Kriste Basque to cont to get refills of the vicodin and alprazolam.  thanks Randell Loop CMA  November 03, 2009 12:35 PM   Leigh, Pt currently has not appt scheduled and SN's Nov sched is filled up...where can I add this pt on.  Thanks.  Aundra Millet Reynolds LPN  November 03, 2009 3:09 PM   Additional Follow-up for Phone Call Additional follow up Details #1::        can use 11-8 at 3pm for pt.  thanks Randell Loop CMA  November 03, 2009 3:36 PM     Additional Follow-up for Phone Call Additional follow up Details #2::    called and spoke with pt. pt aware of SN's recs.  Pt agreed to be seen by SN on 12/14/2009 at 3pm.  Pt aware rx sent to pharmacy. Aundra Millet Reynolds LPN  November 03, 2009 4:02 PM   Prescriptions: ALPRAZOLAM 0.5 MG  TABS (ALPRAZOLAM) as needed four times a day  not to exceed 4 tabs per day and no early refills  #120 x 1   Entered by:   Arman Filter LPN   Authorized by:   [name] MD   Signed by:   Arman Filter LPN on 09/81/1914   Method used:   Telephoned to ...       [facility]       [address]       [place], [place]  [address]       Ph: [phone]       Fax: [phone]   RxID:   [id number] VICODIN 5-500 MG  TABS (HYDROCODONE-ACETAMINOPHEN) 1 by mouth every 6-8 hours as needed  NO EARLY REFIILS  DO NOT EXCEED 3  TABS PER DAY  #90 x 1   Entered by:   Arman Filter LPN   Authorized by:   [name] MD   Signed by:   Arman Filter LPN on 40/34/7425   Method used:    Telephoned to ...       [facility]       [address]       [place], [place]  [address]       Ph: [phone]       Fax: [phone]   RxID:   [id number]

## 2010-03-08 NOTE — Assessment & Plan Note (Signed)
Summary: new to lipid/hyperlipidemia      Allergies Added: NKDA  Visit Type:  Initial Consult   Ms. Paulhus is seen in the lipid clinic at the request of Drs. Cherly Hensen for evaluation and medication titration associated with hyperlipidemia (primary prevention).  Ms. Adamek has tried crestor (and possibly zocor) in the past.  She relates that she developed knee and leg pain in the shin and top of her knee after taking crestor for 1 week that required 3 months to resolve.  She states that she continues to have significant LE pain and discomfort associated with her fibromyalgia.    Diet review:  patient states that she is "almost vegetarian".  She states that she eats well.  She prefers pancakes or eggs for breakfast, and works to eat throughout the day.  She does not exercise regularly due to her fibromyalgia per her report.  She states upon questioning that Dr. Lenna Gilford has recommended swimming in the past but she has not felt motivated to undertake this activity.    Ms. Rohrer states that she continues to smoke a little less than 1 pack of cigarettes daily.  She has thought about quitting, though has not planned how/when she wishes to do so.  She identifies her AM cigarette as the most important, and describes that she uses smoking to calm her anxiety when stressed.    Lipid Management Provider  Alinda Deem, PharmD, BCPS, CPP  Preventive Screening-Counseling & Management  Alcohol-Tobacco     Smoking Status: current     Smoking Cessation Counseling: yes     Smoke Cessation Stage: precontemplative     Packs/Day: 1.0  Caffeine-Diet-Exercise     Diet Counseling: to improve diet; diet is suboptimal     Does Patient Exercise: no   Current Problems (verified): 1)  Globus  (ICD-300.11) 2)  Hyperlipidemia  (ICD-272.4) 3)  Rectal Bleeding  (ICD-569.3) 4)  Diarrhea  (ICD-787.91) 5)  Abdominal Pain-lower  (ICD-789.09) 6)  Arthralgia  (ICD-719.40) 7)  Leg Pain  (ICD-729.5) 8)  Hx of Tmj  Pain  (ICD-524.62) 9)  Cigarette Smoker  (ICD-305.1) 10)  ? of Mitral Valve Prolapse  (ICD-424.0) 11)  Chest Pain  (ICD-786.50) 12)  Hypercholesterolemia  (ICD-272.0) 13)  Hypothyroidism  (ICD-244.9) 14)  Obesity, Mild  (ICD-278.02) 15)  Gerd  (ICD-530.81) 16)  Dysphagia Unspecified  (ICD-787.20) 17)  Ibs  (ICD-564.1) 18)  Liver Function Tests, Abnormal  (ICD-794.8) 19)  Fibromyalgia  (ICD-729.1) 20)  Headache, Chronic  (ICD-784.0) 21)  Periodic Limb Movement Disorder  (ICD-327.51) 22)  Anxiety  (ICD-300.00) 23)  Depression  (ICD-311)  Current Medications (verified): 1)  Synthroid 200 Mcg  Tabs (Levothyroxine Sodium) .Marland Kitchen.. 1 By Mouth Once Daily 2)  Dexilant 60 Mg Cpdr (Dexlansoprazole) .... Take 1 Cap By Mouth Once Daily- 30 Min Before The 1st Meal of The Day... 3)  Vicodin 5-500 Mg  Tabs (Hydrocodone-Acetaminophen) .Marland Kitchen.. 1 By Mouth Every 6-8 Hours As Needed  No Early Refiils  Do Not Exceed 3  Tabs Per Day 4)  Mobic 15 Mg Tabs (Meloxicam) .Marland Kitchen.. 1 By Mouth Once Daily As Needed Joint Pain 5)  Skelaxin 800 Mg  Tabs (Metaxalone) .Marland Kitchen.. 1 By Mouth Three Times A Day As Needed For Muscle Spasm... 6)  Wellbutrin Xl 300 Mg  Tb24 (Bupropion Hcl) .... Take 1 Tablet By Mouth Once A Day 7)  Celexa 40 Mg  Tabs (Citalopram Hydrobromide) .... Take 1 Tablet By Mouth Once A Day 8)  Alprazolam 0.5 Mg  Tabs (Alprazolam) .... As Needed Four Times A Day  Not To Exceed 4 Tabs Per Day and No Early Refills 9)  Zovirax 5 % Oint (Acyclovir) .... Apply To Fever Blister Every 3-4 Hours 10)  Tums 500 Mg Chew (Calcium Carbonate Antacid) .... Take As Needed 11)  Rolaids 334 Mg Chew (Dihydroxyaluminum Sod Carb) .... Take As Needed 12)  Maalox 600 Mg Chew (Calcium Carbonate Antacid) .... Take As Needed 13)  Zithromax Z-Pak 250 Mg Tabs (Azithromycin) .... Take As Directed  Allergies (verified): No Known Drug Allergies  Past History:  Past Medical History: Last updated: 02/04/2009  GLOBUS (ICD-300.11) Hx of TMJ PAIN  (ICD-524.62) CIGARETTE SMOKER (ICD-305.1) ? of MITRAL VALVE PROLAPSE (ICD-424.0) CHEST PAIN (ICD-786.50) HYPERCHOLESTEROLEMIA (ICD-272.0) HYPOTHYROIDISM (ICD-244.9) OBESITY, MILD (ICD-278.02) GERD (ICD-530.81) DYSPHAGIA UNSPECIFIED (ICD-787.20) IBS (ICD-564.1) LIVER FUNCTION TESTS, ABNORMAL (ICD-794.8) FIBROMYALGIA (ICD-729.1) HEADACHE, CHRONIC (ICD-784.0) PERIODIC LIMB MOVEMENT DISORDER (ICD-327.51) ANXIETY (ICD-300.00) DEPRESSION (ICD-311)  Past Surgical History: Last updated: 02/04/2009 S/P ELap w/ lysis of adhesions & excision of hydatid cyst left tube 1992 S/P supracervical hysterectomy  by DrFore in 2002 Tonsillectomy Arm Surgery-right Wrist surgery-right Foot surgery-Left  Family History: Last updated: 06/24/2008 mother alive age 62  hx of chol. and heart problems father alive age 62  hx of DM and heart problems 1 brother alive age 48 1 sister alive age 42 1 sister alive age 45 Family History of Colon Cancer: Paternal Aunt Family History of Breast Cancer: Maternal Aunt x 2 Family History of Stomach Cancer: Paternal Aunt, maternal grandmother Family History of Diabetes: Father, aunt x 2, uncles x 2 Family History of Heart Disease: 4, Father, Aunt, Jon Gills, Grandmother (paternal and maternal) Family History of Kidney Disease: cousin  Social History: Last updated: 10/19/2008 disabled smoker -1 ppd married 2 children Alcohol Use - yes-occasionally Daily Caffeine Use-5 glasses a day Illicit Drug Use - no Patient does not get regular exercise.   Risk Factors: Exercise: no (02/25/2009)  Family History: Reviewed history from 06/24/2008 and no changes required. mother alive age 43  hx of chol. and heart problems father alive age 35  hx of DM and heart problems 1 brother alive age 18 1 sister alive age 9 1 sister alive age 41 Family History of Colon Cancer: Paternal Aunt Family History of Breast Cancer: Maternal Aunt x 2 Family History of  Stomach Cancer: Paternal Aunt, maternal grandmother Family History of Diabetes: Father, aunt x 2, uncles x 2 Family History of Heart Disease: 20, Father, Aunt, Jon Gills, Grandmother (paternal and maternal) Family History of Kidney Disease: cousin  Social History: Reviewed history from 10/19/2008 and no changes required. disabled smoker -1 ppd married 2 children Alcohol Use - yes-occasionally Daily Caffeine Use-5 glasses a day Illicit Drug Use - no Patient does not get regular exercise.  Packs/Day:  1.0   Vital Signs:  Patient profile:   50 year old female Height:      67 inches Weight:      190 pounds Pulse rate:   75 / minute Resp:     16 per minute  Impression & Recommendations:  Problem # 1:  HYPERLIPIDEMIA (ICD-272.4) Ms. Barella has significant hyperlipidemia based on her personal history, though she has had no labs obtained in the past 6 months.  She has failed crestor in the past due to leg discomfort, though I'm concerned that her concomitant fibromyalgia may cloud any future medication trials.  We have had a frank discussion that we will have to work together, and that we may  not be able to obtain our goals for her based on NCEP-III criteria due to her probable need for periodic every other day/q3day therapy.  As she has also had history of significant LFT elevation, I have elected not to begin therapy until after labs are completed today.  She will follow up at [facility] on Monday 03/01/2009 to address GERD / GI issues further.  I will follow-up via telephone later today when labs are available.    Orders TLB-Lipid Panel (80061-LIPID) TLB-TSH (Thyroid Stimulating Hormone) (84443-TSH) TLB-Hepatic/Liver Function Pnl (80076-HEPATIC)  Problem # 2:  CIGARETTE SMOKER (ICD-305.1) Ms. Breidenbach appears to be precontemplative about discontinuing smoking.  I have encouraged her to work to reduce her cigarettes by 1 every 2 - 3 days over the next several weeks.  I have talked  with her about the positive effects on fibromyalgia from reduced / discontinued smoking.  We will readdress at our next visit.    Problem # 3:  OBESITY, MILD (ICD-278.02) We have had a thorough discussion about dietary intake and [employer].  Ms. Ey voiced interest in [place], and we discussed that this diet is not consistent with her stated "vegetarian" tendencies.  I have asked her to avoid white foods, utilitze lean proteins, egg substitutes, simple preparations of vegetables utilizing herbs and avoiding heavy sauces, and increase her water intake while reducing sweetened beverages.  She will report back at her next visit on her progress towards these efforts.    Problem # 4:  LIVER FUNCTION TESTS, ABNORMAL (ICD-794.8) Will defer initiating therapy until labs available in next 24 hours.    Other Orders: Event (Event)  Appended Document: new to lipid/hyperlipidemia ok to start drug.  LFT's minimally elevated recheck 2 months after starting drug.  Talk to primary about thyroid

## 2010-03-08 NOTE — Progress Notes (Signed)
Summary: vicodine auth/ change pharmacy  Phone Note Call from Patient   Caller: Patient Call For: nadel Summary of Call: pt says auth for rx of vicodin has been req [facility] [address] and she is waiting for this. states [facility] [address] because she "always has problems getting her vicodin. cell # [phone]. Initial call taken by: Tivis Ringer,  April 25, 2007 4:55 PM  Follow-up for Phone Call        rx [facility] [place]-pt aware she will p/u this afternoon Follow-up by: Philipp Deputy CMA,  April 26, 2007 10:16 AM

## 2010-03-08 NOTE — Progress Notes (Signed)
Summary: refill on vicodin  ATC-no answer  Phone Note Call from Patient Call back at [phone]   Caller: Patient Call For: nadel Summary of Call: needs [facility]ill [facility]in [facility] [address] Initial call taken by: Lacinda Axon,  November 12, 2007 12:17 PM  Follow-up for Phone Call        called and spoke with pt.   pt states the last time she got vicodin refilled it was for # 90 instead of #120 which is what she had previously been getting. I informed pt per SN's office note on 09-04-07.  SN indicated pt should not be taking more than 3 per day and therefore only gets # 90.  Pt states she needs 4 a day. Please advise.  Cyndia Diver LPN  November 12, 2007 3:18 PM   Additional Follow-up for Phone Call Additional follow up Details #1::        PER SN:  "Sorry- My rule- max of 3 per day. No early refills.  If she needs more for her pain then SN will refer her to a specialist: [facility] or Rheumatologist to manage her pain".  Cyndia Diver LPN  November 12, 2007 4:08 PM   ATC pt at home.  NA.  Will try back later.  Cyndia Diver LPN  November 12, 2007 4:09 PM     Additional Follow-up for Phone Call Additional follow up Details #2::    called and spoke with pt and informed her sn only prescribes a max of 3 per day.  pt verbalized understanding. pt is agreeable to be referred to see a rheumatologist to manage her pain.      Prescriptions: VICODIN 5-500 MG  TABS (HYDROCODONE-ACETAMINOPHEN) 1 by mouth every 6-8 hours as needed  NO EARLY REFIILS  DO NOT EXCEED 3  TABS PER DAY  #90 x 0   Entered by:   Cyndia Diver LPN   Authorized by:   [name] MD   Signed by:   Cyndia Diver LPN on 72/53/6644   Method used:   [facility] ...       [facility] [place] [phone][facility]ail)       [phone] [address], [place]  [address]       Ph: [phone] or [phone]       Fax: [phone]   RxID:   [id number]   Appended Document: refill on synthroid Medications Added SYNTHROID 200 MCG   TABS (LEVOTHYROXINE SODIUM) 1 by mouth once daily [BMN]          Clinical Lists Changes  Medications: Changed medication from SYNTHROID 200 MCG  TABS (LEVOTHYROXINE SODIUM) 1 by mouth once daily [BMN] to SYNTHROID 200 MCG  TABS (LEVOTHYROXINE SODIUM) 1 by mouth once daily [BMN] - Signed Rx of SYNTHROID 200 MCG  TABS (LEVOTHYROXINE SODIUM) 1 by mouth once daily;  #30 x 6 Brand medically necessary;  Signed;  Entered by: Marijo File CMA;  Authorized by: [name] MD;  Method used: [facility] to [facility]. [phone]*, [address] [place], [place]  [address], Ph: [phone] or [phone], Fax: [phone]    Prescriptions: SYNTHROID 200 MCG  TABS (LEVOTHYROXINE SODIUM) 1 by mouth once daily Brand medically necessary #30 x 6   Entered by:   Marijo File CMA   Authorized by:   [name] MD   Signed by:   Marijo File CMA on 12/13/2007  Method used:   Electronically to        [facility] [phone]* (retail)       [address] [place], [place]  [address]       Ph: [phone] or [phone]       Fax: [phone]   RxID:   [id number]   Appended Document: refill vicodin and alprazolam Medications Added ALPRAZOLAM 0.5 MG  TABS (ALPRAZOLAM) as needed four times a day  not to exceed 4 tabs per day and no early refills VICODIN 5-500 MG  TABS (HYDROCODONE-ACETAMINOPHEN) 1 by mouth every 6-8 hours as needed  NO EARLY REFIILS  DO NOT EXCEED 3  TABS PER DAY          Clinical Lists Changes  Medications: Changed medication from VICODIN 5-500 MG  TABS (HYDROCODONE-ACETAMINOPHEN) 1 by mouth every 6-8 hours as needed  NO EARLY REFIILS  DO NOT EXCEED 3  TABS PER DAY to VICODIN 5-500 MG  TABS (HYDROCODONE-ACETAMINOPHEN) 1 by mouth every 6-8 hours as needed  NO EARLY REFIILS  DO NOT EXCEED 3  TABS PER DAY - Signed Changed medication from ALPRAZOLAM 0.5 MG  TABS (ALPRAZOLAM) as needed four times a day to ALPRAZOLAM 0.5 MG  TABS (ALPRAZOLAM) as needed  four times a day  not to exceed 4 tabs per day and no early refills - Signed Rx of VICODIN 5-500 MG  TABS (HYDROCODONE-ACETAMINOPHEN) 1 by mouth every 6-8 hours as needed  NO EARLY REFIILS  DO NOT EXCEED 3  TABS PER DAY;  #90 x 0;  Signed;  Entered by: Marijo File CMA;  Authorized by: [name] MD;  Method used: Printed then faxed to [facility]. [phone]*, [address] [place], [place]  [address], Ph: [phone] or [phone], Fax: [phone] Rx of ALPRAZOLAM 0.5 MG  TABS (ALPRAZOLAM) as needed four times a day  not to exceed 4 tabs per day and no early refills;  #120 x 0;  Signed;  Entered by: Marijo File CMA;  Authorized by: [name] MD;  Method used: Printed then faxed to [facility]. [phone]*, [address] [place], [place]  [address], Ph: [phone] or [phone], Fax: [phone]    Prescriptions: ALPRAZOLAM 0.5 MG  TABS (ALPRAZOLAM) as needed four times a day  not to exceed 4 tabs per day and no early refills  #120 x 0   Entered by:   Marijo File CMA   Authorized by:   [name] MD   Signed by:   Marijo File CMA on 12/13/2007   Method used:   Printed then faxed to ...       [facility] [place] [phone]* (retail)       [phone], [place]  [address]       Ph: [phone] or [phone]       Fax: [phone]   RxID:   [id number] VICODIN 5-500 MG  TABS (HYDROCODONE-ACETAMINOPHEN) 1 by mouth every 6-8 hours as needed  NO EARLY REFIILS  DO NOT EXCEED 3  TABS PER DAY  #90 x 0   Entered by:   Marijo File CMA   Authorized by:   [name] MD   Signed by:   Marijo File CMA on 12/13/2007   Method used:   Printed then faxed to ...       [facility]  [address] [phone]* (retail)       [phone], [place]  [address]       Ph: [phone] or [phone]       Fax: [phone]   RxID:   [id number]

## 2010-03-08 NOTE — Progress Notes (Signed)
Summary: REFILL MEDICATION-attempted to call no answer  Phone Note Call from Patient   Caller: Patient Call For: NADEL Complaint: Headache Summary of Call: PT STATS PHARMACY HAVE NOT RECEIVED PRESCRIPT FOR Horatio Pel AND VICODIN PLS CALL PHARMACY INSTEAD OF FAXING AND CALL HER  WHEN IT HAS BEEN DONE Initial call taken by: Rickard Patience,  January 15, 2008 1:33 PM  Follow-up for Phone Call        called pharmacy; gave verbal as the fax never went through on 01-13-08. Attempted to call pt x 2; no answer; try again later. Reynaldo Minium CMA  January 15, 2008 4:04 PM   med was called to pharmacy by Florentina Addison pt will check with pharmacy Follow-up by: Philipp Deputy CMA,  January 16, 2008 10:41 AM

## 2010-03-08 NOTE — Progress Notes (Signed)
   Phone Note Outgoing Call   Call placed by: Deliah Goody, RN,  April 09, 2009 2:21 PM Summary of Call: monitor reviewed by dr Eden Emms, NSR, no arrythmia. Left message to call back Deliah Goody, RN  April 09, 2009 2:22 PM   Follow-up for Phone Call        returning call , Migdalia Dk  April 09, 2009 2:56 PM   left message of results for pt Deliah Goody, RN  April 09, 2009 5:39 PM

## 2010-03-08 NOTE — Progress Notes (Signed)
Summary: [facility] appt?   Phone Note Call from Patient Call back at Home Phone [phone]   Caller: Patient Call For: Dr. Leone Payor Reason for Call: Talk to Nurse Summary of Call: pt thinks she isupposed to be getting an appt at [facility] but hasnt heard anything yet Initial call taken by: Vallarie Mare,  February 19, 2009 9:40 AM  Follow-up for Phone Call        Advised her I spoke with [facility] on 02-12-09 and they haven't scheduled her yet.  I advised her I will check back with them next week on appointment status. Follow-up by: Darcey Nora RN, CGRN,  February 19, 2009 10:06 AM

## 2010-03-08 NOTE — Consult Note (Signed)
Summary: [facility]  [facility]   Imported By: Esmeralda Links D'jimraou 04/30/2007 15:07:30  _____________________________________________________________________  External Attachment:    Type:   Image     Comment:   External Document

## 2010-03-08 NOTE — Letter (Signed)
Summary: [facility]  [facility]   Imported By: Sherian Rein 07/26/2009 11:37:30  _____________________________________________________________________  External Attachment:    Type:   Image     Comment:   External Document

## 2010-03-08 NOTE — Progress Notes (Signed)
Summary: sleeping constantly   atc x3  Phone Note Call from Patient Call back at Home Phone [phone]   Caller: Patient Call For: nadel Reason for Call: Talk to Nurse, Talk to Doctor Summary of Call: sleeping all the time for past 2 weeks, has slept for @ 36 hours straight, thinks may be thyroid. Initial call taken by: Eugene Gavia,  August 05, 2007 11:13 AM  Follow-up for Phone Call        WANTS TO SLEEP ALL THE TIME, FEELS  WEAK, NO ENERGY Follow-up by: Philipp Deputy CMA,  August 05, 2007 3:19 PM  Additional Follow-up for Phone Call Additional follow up Details #1::        called pt at home but no answer on the home #.   per SN  she can have tsh,free t4, free t3, dx code 246.9--needs to be put in the computer for pt. Marijo File CMA  August 07, 2007 9:30 AM  Attempted TCB no answer. Cloyde Reams RN  August 07, 2007 11:16 AM  Attempted TCB NA. Cloyde Reams RN  August 08, 2007 10:45 AM     Additional Follow-up for Phone Call Additional follow up Details #2::    Have attempted to call pt back multiple times. Unable to contact pt.  Will await her call back and place lab orders if she desires at her convinence. Follow-up by: Cloyde Reams RN,  August 08, 2007 10:46 AM

## 2010-03-08 NOTE — Assessment & Plan Note (Signed)
Summary: Cardiology Nuclear Study  Nuclear Med Background Indications for Stress Test: Evaluation for Ischemia   History: Echo, Myocardial Perfusion Study  History Comments: '02 MPS NL EF73% 10/06 ECHO   Symptoms: Chest Pain, Palpitations    Nuclear Pre-Procedure Cardiac Risk Factors: Family History - CAD, Lipids, Smoker Caffeine/Decaff Intake: none NPO After: 10:00 PM Lungs: clear IV 0.9% NS with Angio Cath: 22g     IV Site: (R) AC IV Started by: Irven Baltimore RN Chest Size (in) 38     Cup Size C     Height (in): 67 Weight (lb): 182 BMI: 28.61  Nuclear Med Study 1 or 2 day study:  1 day     Stress Test Type:  Adenosine Reading MD:  Glori Bickers, MD     Referring MD:  P.Nishan Resting Radionuclide:  Technetium 16mTetrofosmin     Resting Radionuclide Dose:  10.8 mCi  Stress Radionuclide:  Technetium 953metrofosmin     Stress Radionuclide Dose:  33 mCi   Stress Protocol  Dose of Adenosine:  46.3 mg    Stress Test Technologist:  SaPerrin MalteseMT-P     Nuclear Technologist:  StCharlton AmorNMT  Rest Procedure  Myocardial perfusion imaging was performed at rest 45 minutes following the intraveneous administration of Myoview Technetium 9941mtrofosmin.  Stress Procedure  The patient received IV adenosine at 140 mcg/kg/min for 4 minutes. There were no significant changes, + chest tightness with infusion. Myoview was injected at the 2 minute mark and quantitative spect images were obtained after a 45 minute delay.  QPS Raw Data Images:  Normal; no motion artifact; normal heart/lung ratio. Stress Images:  There is normal uptake in all areas. Rest Images:  Normal homogeneous uptake in all areas of the myocardium. Subtraction (SDS):  Normal Transient Ischemic Dilatation:  .99  (Normal <1.22)  Lung/Heart Ratio:  .37  (Normal <0.45)  Quantitative Gated Spect Images QGS EDV:  77 ml QGS ESV:  23 ml QGS EF:  70 % QGS cine images:  Normal  Findings Normal nuclear  study      Overall Impression  Exercise Capacity: Adenosine study with no exercise. ECG Impression: Baseline: NSR; No significant ST segment change with adenosine. Overall Impression: Normal stress nuclear study.  Appended Document: Cardiology Nuclear Study normal nuclear study  Appended Document: Cardiology Nuclear Study pt aware./cy

## 2010-03-08 NOTE — Progress Notes (Signed)
Summary: written rx  Phone Note Call from Patient Call back at [phone]   Caller: Patient Call For: Zonie Crutcher Reason for Call: Refill Medication, Talk to Nurse Summary of Call: hydrocodone(Vicodin) and xanax refill.  Need written rx to pickup today because changing Pharm.  Please call pt when ready. Initial call taken by: Zigmund Gottron,  February 14, 2008 1:16 PM  Follow-up for Phone Call        Dr Jeannine Kitten patient. Follow-up by: Deneise Lever MD,  February 17, 2008 10:47 PM  Additional Follow-up for Phone Call Additional follow up Details #1::        spoke with [facility] [address] pt has 2 rx's waiting to be picked up--pt aware Additional Follow-up by: [place],  February 18, 2008 9:47 AM

## 2010-03-08 NOTE — Progress Notes (Signed)
Summary: lipitor    Phone Note Call from Patient Call back at Home Phone [phone]   Caller: Patient Reason for Call: Talk to Nurse Summary of Call: on lipitor the last 2 week.... has had leg pain ever since Initial call taken by: Darnell Level,  March 25, 2009 1:27 PM  Follow-up for Phone Call        Try zocor 57m and see how her legs feel Follow-up by: PBosie Clos [facility], [facility]  March 26, 2009 9:55 AM     Appended Document: lipitor  called pt.  she will stay off lipitor for 10 days and call back to let uKoreaknow how she is doing..Marland Kitchenp

## 2010-03-08 NOTE — Progress Notes (Signed)
Summary: rx  Phone Note Call from Patient Call back at Home Phone [phone]   Caller: Patient Call For: nadel Reason for Call: Refill Medication, Talk to Nurse Summary of Call: pt would like her xanax and vicodin called in to pharmacy.  Pt wishes to change to pharmacy listed below.  All meds... [facility] [address] [address] Initial call taken by: Eugene Gavia,  May 25, 2008 9:25 AM  Follow-up for Phone Call        meds phoned to new pharmacy, last refill shows to have been given 3/16 Follow-up by: Philipp Deputy CMA,  May 25, 2008 10:50 AM      Prescriptions: VICODIN 5-500 MG  TABS (HYDROCODONE-ACETAMINOPHEN) 1 by mouth every 6-8 hours as needed  NO EARLY REFIILS  DO NOT EXCEED 3  TABS PER DAY  #90 x 0   Entered by:   Philipp Deputy CMA   Authorized by:   [name] MD   Signed by:   Philipp Deputy CMA on 05/25/2008   Method used:   Telephoned to ...       [facility] [id number] (retail)       [address] [place], [place]  [address]       Ph: [phone]       Fax: [phone]   RxID:   [id number] ALPRAZOLAM 0.5 MG  TABS (ALPRAZOLAM) as needed four times a day  not to exceed 4 tabs per day and no early refills  #120 x 0   Entered by:   Philipp Deputy CMA   Authorized by:   [name] MD   Signed by:   Philipp Deputy CMA on 05/25/2008   Method used:   Telephoned to ...       [facility] [address] [facility] [id number]* (retail)       [address] [place], [place]  [address]       Ph: [phone]       Fax: [phone]   RxID:   [id number]

## 2010-03-08 NOTE — Procedures (Signed)
Summary: Endoscopy: gastritis   EGD  Procedure date:  11/06/2008  Findings:      Location: [facility]   ENDOSCOPY PROCEDURE REPORT  PATIENT:  Tasha [name], Tasha [name]  MR#:  [id number] BIRTHDATE:   10-Mar-1960, 48 yrs. old   GENDER:   female  ENDOSCOPIST:   Iva Boop, MD, [facility]    PROCEDURE DATE:  11/06/2008 PROCEDURE:  EGD with biopsy ASA CLASS:   Class II INDICATIONS: GERD, heartburn   MEDICATIONS:    Fentanyl 100 mcg IV, Versed 7 mg IV TOPICAL ANESTHETIC:   Exactacain Spray  DESCRIPTION OF PROCEDURE:   After the risks benefits and alternatives of the procedure were thoroughly explained, informed consent was obtained.  The LB GIF-H180 [id number] endoscope was introduced through the mouth and advanced to the second portion of the duodenum, without limitations.  The instrument was slowly withdrawn as the mucosa was fully examined. <<PROCEDUREIMAGES>>          <<OLD IMAGES>>  The esophagus and gastroesophageal junction were completely normal in appearance. z-lineat 36 cm  Severe gastritis was found in the total stomach. Moasic mucosal pattern with erythema throughout. Raised, erythematous mucosa in the antrum where the gastritis was severe. Multiple biopsies were obtained and sent to pathology.  The duodenal bulb was normal in appearance, as was the postbulbar duodenum.    Retroflexed views revealed Retroflexion exam demonstrated findings as previously described.    The scope was then withdrawn from the patient and the procedure completed.  COMPLICATIONS:   None  ENDOSCOPIC IMPRESSION:  1) Normal esophagus  2) Severe gastritis in the total stomach  3) Normal duodenum   RECOMMENDATIONS:  1) await biopsy results  Will change medications after biopsies return.  Aciphex and other PPI's have not helped so her problems do not seem to be related to acid.     Iva Boop, MD, [facility]    CC: The Patient [facility] ENT     REPORT OF SURGICAL PATHOLOGY     Case #: [id number] Patient Name: Tasha [name], Tasha [name] Office Chart Number:  N/A [id number] MRN: [id number] Pathologist: Beulah Gandy. Luisa Hart, MD DOB/Age  [birth year] (Age: 82)    Gender: F Date Taken:  11/06/2008 Date Received: 11/06/2008   FINAL DIAGNOSIS   ***MICROSCOPIC EXAMINATION AND DIAGNOSIS***   GASTRIC BIOPSIES:  MILD CHRONIC GASTRITIS AND HYPEREMIA.  NO HELICOBACTER PYLORI, DYSPLASIA OR MALIGNANCY IDENTIFIED.     COMMENT In addition to mild chronic gastritis there is patchy hyperemia and edema with associated microhemorrhages.  These changes raise the possibility of an early phase of gastropathy as seen with NSAIDS and other mucosal irritants.  Clinical correlation is suggested.  A Warthin-Starry stain is performed to determine the possibility of the presence of Helicobacter pylori. The Warthin-Starry stain is negative for organisms of Helicobacter pylori. The control(s) stained appropriately.   kv Date Reported:  11/09/2008     Beulah Gandy. Luisa Hart, MD *** Electronically Signed Out By JDP *** This report was created from the original endoscopy report, which was reviewed and signed by the above listed endoscopist.   Appended Document: Endoscopy: gastritis no recall call patient from office and try carafate 1 g qac/hs # 1 month supply and 5 refills   Signed by Iva Boop MD, [facility] on 11/09/2008 at 8:36 PM  ________________________________________________________________________ Patient  notified, rx sent to her pharmacy   Clinical Lists Changes  Medications: Added new medication of CARAFATE 1 GM/10ML SUSP (SUCRALFATE) 1 gm ac meals and HS - Signed Rx of  CARAFATE 1 GM/10ML SUSP (SUCRALFATE) 1 gm ac meals and HS;  #1200 ml x 5;  Signed;  Entered by: Darcey Nora RN, CGRN;  Authorized by: Iva Boop MD, FACG;  Method used: Electronically to [id number]*, [address], [place], [place]  [address], Ph: [phone], Fax:  [phone]    Prescriptions: CARAFATE 1 GM/10ML SUSP (SUCRALFATE) 1 gm ac meals and HS  #1200 ml x 5   Entered by:   Darcey Nora RN, CGRN   Authorized by:   Iva Boop MD, [facility]   Signed by:   Darcey Nora RN, CGRN on 11/10/2008   Method used:   Electronically to        [facility] [id number]* (retail)       [address] [place], [place]  [address]       Ph: [phone]       Fax: [phone]   RxID:   [id number]      Signed by Darcey Nora RN, CGRN on 11/10/2008 at 10:50 AM  ________________________________________________________________________

## 2010-03-08 NOTE — Progress Notes (Signed)
Summary: triage   Phone Note Call from Patient Call back at Home Phone [phone]   Caller: Patient Call For: Dr. Leone Payor Reason for Call: Talk to Nurse Details for Reason: triage Summary of Call: pt would like to be seen sooner than first available which is early October... pt was told to sch an EGD asap by her ENT doctor Initial call taken by: Vallarie Mare,  October 19, 2008 1:04 PM  Follow-up for Phone Call        Patient  was seen by Dr Annalee Genta PA (ENT)  today and she had a scope in their office and they saw something behind her vocal cords and have recommended she have an EGD.  She has bee having reflux, heartburn, difficulty swallowing.  She will come in this pm and see Dr Elenore Paddy at 2:45.  I have asked her to please call and have the notes faxed over now  Follow-up by: Darcey Nora RN, CGRN,  October 19, 2008 1:45 PM

## 2010-03-08 NOTE — Progress Notes (Signed)
Summary: triage   Phone Note Call from Patient Call back at Home Phone [phone]   Caller: Patient Call For: Leone Payor Reason for Call: Talk to Nurse Summary of Call: Patient has a lot of rectal pain (hemmoroids) Initial call taken by: Tawni Levy,  May 21, 2009 10:55 AM  Follow-up for Phone Call        Patient with rectal pressure.  Denies any rectal pain, denies itching, burning or bleeding.  Has tried prep- h for 2 days with no relief.  Follow-up by: Darcey Nora RN, CGRN,  May 21, 2009 11:22 AM  Additional Follow-up for Phone Call Additional follow up Details #1::        Discussed with Dr Leone Payor.  Patient  will come in this pm aand see him at 4:00 Additional Follow-up by: Darcey Nora RN, CGRN,  May 21, 2009 11:26 AM

## 2010-03-08 NOTE — Letter (Signed)
Summary: Letter re: did not keep [facility]  [address] [place], [place] [address]   Phone: [phone]  Fax: [phone]        March 09, 2009 MRN: [id number]    SAMMYE STAFF [address] [place], [place]  [address]    Dear Ms. Linna Darner,  It has come to my attention that you did not keep your appointment with the [facility] gastroenterology clinic.   It is our sincere desire to help you with your health problems, which is why we scheduled this consultation at a world-renowned gastroenterology clinic like [facility].  I do not have any further ideas as to how to help your problems that I evaluated you for at the end of 2010. Though it is certainly your choice to follow through or not with your appointments, I think cancelling the [facility] appointment was a mistake.  In order to get help for your problems, please follow through with this recommended evaluation at [facility]. I do not think it will make sense for me to see you again (outside of urgent/emergent problems) until that evaluation is completed.    Yours truly,  Iva Boop MD, [facility]  This letter has been electronically signed by your physician.  Appended Document: Letter re: did not keep [facility] appt letter mailed to patient's home

## 2010-03-08 NOTE — Progress Notes (Signed)
Summary: referral req  Phone Note Call from Patient   Caller: Patient Call For: nadel Summary of Call: pt wants a referral to a cardiologist. pt # [phone] Initial call taken by: Tivis Ringer,  September 29, 2008 11:28 AM  Follow-up for Phone Call        Spoke with pt.  She c/o cp since this am.  States that she has had cp on and off for about 1 month.  She had someindigestion yesterday and then woke up this am with tightness in chest and some difficulty breathing.  OV with TP for tommorrow at 11:15 am.  Advised pt that if symptoms worsen or new symptoms arise go to ER or UC asap. Pt voiced her understanding. Follow-up by: Vernie Murders,  September 29, 2008 11:38 AM

## 2010-03-08 NOTE — Progress Notes (Signed)
  Phone Note Call from Patient   Caller: Patient Call For: nadel Complaint: Chest Pain Summary of Call: pt called in complaining of chest pains, dizzy, heaviness in chest.  Feels like heart stops and starts.  Told pt to got to ED to be eval.  Florentina Addison was here and concurred with this.  Pt said she didn't want to go to ed, wanted to see SN or TP.  Told pt w/ these symptoms, ed would be better plae to go.  Katie Concurred. Initial call taken by: Eugene Gavia,  September 22, 2008 11:59 AM

## 2010-03-08 NOTE — Progress Notes (Signed)
Summary: Nuclear Pre-Procedure   Phone Note Outgoing Call   Call placed by: Milana Na, EMT-P,  October 13, 2008 2:40 PM Summary of Call: Reviewed information on Myoview Information Sheet (see scanned document for further details).  Spoke with patient.     Nuclear Med Background Indications for Stress Test: Evaluation for Ischemia   History: Echo, Myocardial Perfusion Study  History Comments: '02 MPS NL EF73% 10/06 ECHO   Symptoms: Chest Pain, Palpitations    Nuclear Pre-Procedure Cardiac Risk Factors: Family History - CAD, Lipids, Smoker Height (in): 67  Nuclear Med Study Referring MD:  P.Sara Lee

## 2010-03-08 NOTE — Assessment & Plan Note (Signed)
Summary: DIARREAH SO BAD/YF    History of Present Illness Visit Type: new patient Primary GI MD: Stan Head MD [facility] Primary Provider: Alroy Dust, MD Chief Complaint: Patient c/o 2 months diarrhea along with dark black stools.  Patient also c/o lower abdominal discomfort as well.  She states that she also has dysphagia to pills as well as some excessive belching and bloating. No nausea or vomiting is associated with this. History of Present Illness:   Symptoms x 1-2 months. No new medications, travel, contacts. She did not take the prescribed Z pak in November. Stools started to feel gritty and now every times she eats she moves her bowels. She has had some nocturnal symptoms. Hemorrhoids were rritated and have bled. She strains and cramps and has tenesmus. + incontinence Prevacid two times a day is not controlling her heartburn. Dysphagia is described, on left side she has pills stick, not food. Has had trouble starting swallows at times. Cramps with bowel movements severe. imodium made her worse. Also with bloating and gas. All other GI ROS negative.            Prior Medications Reviewed Using: Patient Recall  Updated Prior Medication List: SYNTHROID 200 MCG  TABS (LEVOTHYROXINE SODIUM) 1 by mouth once daily [BMN] PREVACID 30 MG  CPDR (LANSOPRAZOLE) Take 1 tablet by mouth once a day WELLBUTRIN XL 300 MG  TB24 (BUPROPION HCL) Take 1 tablet by mouth once a day CELEXA 40 MG  TABS (CITALOPRAM HYDROBROMIDE) Take 1 tablet by mouth once a day ALPRAZOLAM 0.5 MG  TABS (ALPRAZOLAM) as needed four times a day  not to exceed 4 tabs per day and no early refills SKELAXIN 800 MG  TABS (METAXALONE) 1 by mouth three times a day as needed MIRAPEX 0.125 MG  TABS (PRAMIPEXOLE DIHYDROCHLORIDE) take 2 tablets daily MIDRIN 325-65-100 MG  CAPS (APAP-ISOMETHEPTENE-DICHLORAL) once daily as needed headache VICODIN 5-500 MG  TABS (HYDROCODONE-ACETAMINOPHEN) 1 by mouth every 6-8 hours as needed  NO EARLY  REFIILS  DO NOT EXCEED 3  TABS PER DAY MOBIC 15 MG TABS (MELOXICAM) 1 by mouth once daily as needed joint pain ZOVIRAX 5 % OINT (ACYCLOVIR) apply to fever blister every 3-4 hours  Current Allergies (reviewed today): No known allergies  Past Medical History:    Reviewed history from 01/28/2008 and no changes required:       Hx of TMJ PAIN (ICD-524.62)       CIGARETTE SMOKER (ICD-305.1) - she continues to smoke 1ppd...       MITRAL VALVE PROLAPSE (ICD-424.0)        ~  2DEcho 10/06 was normal without MVP seen, no MR, norm LVF... prev 2D in 1990 w/ mild bowing of ant leaflet c/w mild MVP at that time...        ~  NuclearStressTest 2/02 was WNL- no ischemia, no infarction, EF=73%...       HYPERCHOLESTEROLEMIA (ICD-272.0) - hx of Chol values >300 in the past (esp when thyroid was underactive)... she has been inconsistant w/ f/u labs...       HYPOTHYROIDISM (ICD-244.9) - on SYNTHROID 248mcg/d... hypothyroidism initially diagnosed in the 1980's and she's been on meds ever since then...         ~  labs 12/08 showed TSH= 31 ? off her meds... rec to restart her Synthroid regularly...       GERD (ICD-530.81) & DYSPHAGIA UNSPECIFIED (ICD-787.20) - prev GI eval by DrSamLeBauer-        IBS (ICD-564.1) - colonoscopy  3/93 by DrSam was WNL...symptoms were felt to be IBS...        ~  CTAbd 8/06 showed fatty liver, neg abd, s/p hyst, left ovarian cyst, norm right ovary...       LIVER FUNCTION TESTS, ABNORMAL (ICD-794.8) - she had second GI opinion and eval of Abn LFT's by DrBuccini in 2002... he felt she had Fatty Liver disease...                       Anxiety Disorder       Arthritis       Asthma       Chronic Headaches       Infectious Colitis       Depression       Hyperlipidemia  Past Surgical History:    S/P ELap w/ lysis of adhesions & excision of hydatid cyst left tube 1992    S/P supracervical hysterectomy  by DrFore in 2002    Tonsillectomy    Arm Surgery-right    Wrist surgery-right     Foot surgery-Left   Family History:    mother alive age 62  hx of chol. and heart problems    father alive age 69  hx of DM and heart problems    1 brother alive age 8    1 sister alive age 65    1 sister alive age 73    Family History of Colon Cancer: Paternal Aunt    Family History of Breast Cancer: Maternal Aunt x 2    Family History of Stomach Cancer: Paternal Aunt    Family History of Diabetes: Father, aunt x 2, uncles x 2    Family History of Heart Disease: Grandmother, Father, Aunt, Emelia Loron, Grandmother (paternal and maternal)    Family History of Kidney Disease: cousin  Social History:    disabled    smoker -1 ppd    married    2 children    Alcohol Use - yes-occasionally    Daily Caffeine Use-15-20 cups per day    Illicit Drug Use - no    Patient does not get regular exercise.   Risk Factors:  Drug use:  no Alcohol use:  yes Exercise:  no  Review of Systems       The patient complains of allergy/sinus, anxiety-new, arthritis/joint pain, back pain, depression-new, fatigue, headaches-new, muscle pains/cramps, night sweats, swollen lymph glands, thirst - excessive, and urine leakage.         All other ROS negative except as per HPI.   Vital Signs:  Patient Profile:   50 Years Old Female Height:     67 inches Weight:      182.13 pounds BMI:     28.63 BSA:     1.94 Pulse rate:   68 / minute Pulse rhythm:   regular BP sitting:   120 / 68  (left arm)  Vitals Entered By: Hortense Ramal CMA (April 06, 2008 3:32 PM)                  Physical Exam  General:     healthy appearing and obese.   Eyes:      no icterus. Mouth:     No deformity or lesions, dentition normal. Lungs:     Clear throughout to auscultation. Heart:     Regular rate and rhythm; no murmurs, rubs,  or bruits. Abdomen:     mildly tender lower quadrans, otherwise normal w/o HSM or  masses BS ? Rectal:     deferred until time of colonoscopy.   Extremities:     No clubbing,  cyanosis, edema or deformities noted. Neurologic:     Alert and  oriented x4;  grossly normal neurologically. Skin:     no rashes Cervical Nodes:     No significant cervical or supraclavicular adenopathy.  Psych:     Alert and cooperative. Normal mood and affect.   Impression & Recommendations:  Problem # 1:  DIARRHEA (ICD-787.91) Assessment: New IBS, IBD, infection? Likely IBS. Need to look for infection. at 53 and with overall picture will schedle for colonoscopy rule out hyperthyroid also Risks, benefits,and indications of endoscopic procedure(s) were reviewed with the patient and all questions answered.  Orders: TLB-CBC Platelet - w/Differential (85025-CBCD) TLB-CMP (Comprehensive Metabolic Pnl) (80053-COMP) TLB-TSH (Thyroid Stimulating Hormone) (84443-TSH) TLB-Sedimentation Rate (ESR) (85652-ESR) T-Culture, Stool (87045/87046-70140) T-Stool for O&P (56213-08657) T-Culture, C-Diff Toxin A/B (84696-29528) T-Fecal WBC (41324-40102) Colonoscopy (Colon)   Problem # 2:  ABDOMINAL PAIN-LOWER (ICD-789.09) Assessment: New Related to diarrhea. Same differential diagnosis of IBS, IBD, infection. Neoplasm very unlikely, I think.  Problem # 3:  RECTAL BLEEDING (ICD-569.3)  Problem # 4:  IBS (ICD-564.1)   Patient Instructions: 1)  Please go to the basement to have your lab tests drawn today. 2)  Please pick up your medications at your pharmacy. 3)  Will call results and also see you at your colonoscopy.  Prescriptions: MOVIPREP 100 GM  SOLR (PEG-KCL-NACL-NASULF-NA ASC-C) As per prep instructions.  #1 x 0   Entered by:   Francee Piccolo CMA   Authorized by:   Iva Boop MD   Signed by:   Francee Piccolo CMA on 04/06/2008   Method used:   [facility] to        [facility] [phone]* (retail)       [address], [place]  [address]       Ph: [phone] or [phone]       Fax: [phone]   RxID:   [id number] LOMOTIL 2.5-0.025 MG  TABS (DIPHENOXYLATE-ATROPINE) 1 by mouth q ac  #90 x 0   Entered and Authorized by:   Iva Boop MD   Signed by:   Iva Boop MD on 04/06/2008   Method used:   Printed then faxed to ...       [facility] [place] [phone]* (retail)       [phone], [place]  [address]       Ph: [phone] or [phone]       Fax: [phone]   RxID:   [id number]

## 2010-03-08 NOTE — Assessment & Plan Note (Signed)
Summary: sob/cp/lmr   Primary Provider/Referring Provider:  Alroy Dust, MD  CC:  SOB when waking up in am's for 1 month- chest tightness- denies wheezing- c/o irregular heartbeat last week.  History of Present Illness: 50  year old female patient of Dr. Kriste Basque that has known history of hypothyroidism, chronic pain syndrome, fibromyalgia, chronic headache previous evaluation at headache clinic.  Use chronic pain meds on regular basis.    7/09-- .. since I saw her last in Dec08 she has persisted w/ her complaints of fatigue, aches, pains, and soreness all over- characteristic of her Fibromyalgia... she was prev eval by DrDeveshwar for Rheum but hasn't had a follow up in some time and she has stopped the Flexeril med... she continues to smoke 1ppd and isn't interested in smoking cessation help... she states that she's depressed and has prev seen a psychiatrist but can't remember who it was... she has mult somatic complaints and notes dysphagia and wants a GI referral to evaluate this further...   January 28, 2008 -- Complains over last 3 months having increased joint pain in hips, and knees. Aches at times, increased in cold weather and over last 2 weeks. takes otc with only mild relief. vicodin helps  September 03, 2008--Returns for follow up and med refills. Complains that headaches are getting worse. Uses vicodin w/ some help. Has hx of MHA, w/ associated aura , prev seen by neuro She has been to Headache clinic in past , prev. on several meds to help with prevention incluidng Topamax. Declines referral to headache clinic. Associated nausea, photosensitivty, w/ several a week. Pt is fasitng today for labs. Complains of diffuse muscle aches, fatigue, has Fibromyaglia. Discussed various options of treatment incluidsing joga, stress reducers.    September 30, 2008- Presents for acute office visit. Complains of -SOB when waking up in am's for 1 month- chest tightness- denies wheezing- c/o irregular heartbeat  last week. prior to waking up. pounding in chest. Has family hx of CAD. She is smoker w/ hyperlipidemia, obesity. C/o of reflux worse over last month, couple of episodes w/ reflux into throat. Denies presyncope/syncope, orthopnea, hemoptysis, fever, n/v/d, edema, headache.   In past, 2DEcho 10/06 was normal without MVP seen, no MR, norm LVF... prev 2D in 1990 w/ mild bowing of ant leaflet c/w mild MVP at that time... ~  NuclearStressTest 2/02 was WNL- no ischemia, no infarction, EF=73%...  Current Medications (verified): 1)  Synthroid 200 Mcg  Tabs (Levothyroxine Sodium) .Marland Kitchen.. 1 By Mouth Once Daily 2)  Prevacid 30 Mg  Cpdr (Lansoprazole) .... Take 1 Tablet By Mouth Once A Day 3)  Wellbutrin Xl 300 Mg  Tb24 (Bupropion Hcl) .... Take 1 Tablet By Mouth Once A Day 4)  Celexa 40 Mg  Tabs (Citalopram Hydrobromide) .... Take 1 Tablet By Mouth Once A Day 5)  Alprazolam 0.5 Mg  Tabs (Alprazolam) .... As Needed Four Times A Day  Not To Exceed 4 Tabs Per Day and No Early Refills 6)  Skelaxin 800 Mg  Tabs (Metaxalone) .Marland Kitchen.. 1 By Mouth Three Times A Day As Needed 7)  Vicodin 5-500 Mg  Tabs (Hydrocodone-Acetaminophen) .Marland Kitchen.. 1 By Mouth Every 6-8 Hours As Needed  No Early Refiils  Do Not Exceed 3  Tabs Per Day 8)  Mobic 15 Mg Tabs (Meloxicam) .Marland Kitchen.. 1 By Mouth Once Daily As Needed Joint Pain 9)  Zovirax 5 % Oint (Acyclovir) .... Apply To Fever Blister Every 3-4 Hours 10)  Zocor 20 Mg Tabs (Simvastatin) .... Take  1 Tab By Mouth At Bedtime.  Return For Office Visit and Fasting Labs Week of 10-19-08.  Allergies (verified): No Known Drug Allergies  Past History:  Past Medical History: Last updated: 09/03/2008 Hx of TMJ PAIN (ICD-524.62) CIGARETTE SMOKER (ICD-305.1) - she continues to smoke 1ppd... MITRAL VALVE PROLAPSE (ICD-424.0)  ~  2DEcho 10/06 was normal without MVP seen, no MR, norm LVF... prev 2D in 1990 w/ mild bowing of ant leaflet c/w mild MVP at that time...  ~  NuclearStressTest 2/02 was WNL- no ischemia,  no infarction, EF=73%... HYPERCHOLESTEROLEMIA (ICD-272.0) - hx of Chol values >300 in the past (esp when thyroid was underactive)... she has been inconsistant w/ f/u labs... HYPOTHYROIDISM (ICD-244.9) - on SYNTHROID 284mcg/d... hypothyroidism initially diagnosed in the 1980's and she's been on meds ever since then...   ~  labs 12/08 showed TSH= 31 ? off her meds... rec to restart her Synthroid regularly... GERD (ICD-530.81) & DYSPHAGIA UNSPECIFIED (ICD-787.20) - prev GI eval by DrSamLeBauer-  IBS (ICD-564.1) - colonoscopy 3/93 by DrSam was WNL...symptoms were felt to be IBS...  ~  CTAbd 8/06 showed fatty liver, neg abd, s/p hyst, left ovarian cyst, norm right ovary... LIVER FUNCTION TESTS, ABNORMAL (ICD-794.8) - she had second GI opinion and eval of Abn LFT's by DrBuccini in 2002... he felt she had Fatty Liver disease... -Chronic Headache- prev eval w/ neuro, Dr. Lissa Morales.        Past Surgical History: Last updated: 04/06/2008 S/P ELap w/ lysis of adhesions & excision of hydatid cyst left tube 1992 S/P supracervical hysterectomy  by DrFore in 2002 Tonsillectomy Arm Surgery-right Wrist surgery-right Foot surgery-Left  Family History: Last updated: 06/24/2008 mother alive age 2  hx of chol. and heart problems father alive age 21  hx of DM and heart problems 1 brother alive age 13 1 sister alive age 85 1 sister alive age 59 Family History of Colon Cancer: Paternal Aunt Family History of Breast Cancer: Maternal Aunt x 2 Family History of Stomach Cancer: Paternal Aunt, maternal grandmother Family History of Diabetes: Father, aunt x 2, uncles x 2 Family History of Heart Disease: Grandmother, Father, Aunt, Emelia Loron, Grandmother (paternal and maternal) Family History of Kidney Disease: cousin  Social History: Last updated: 04/06/2008 disabled smoker -1 ppd married 2 children Alcohol Use - yes-occasionally Daily Caffeine Use-15-20 cups per day Illicit Drug Use - no Patient does  not get regular exercise.   Risk Factors: Smoking Status: current (11/22/2006) Packs/Day: 1 (11/22/2006)  Review of Systems      See HPI  Vital Signs:  Patient profile:   50 year old female Height:      67 inches Weight:      187.50 pounds O2 Sat:      97 % on Room air Temp:     98.3 degrees F oral Pulse rate:   62 / minute BP sitting:   118 / 78  (left arm) Cuff size:   regular  Vitals Entered By: Abigail Miyamoto RN (September 30, 2008 11:08 AM)  O2 Flow:  Room air CC: SOB when waking up in am's for 1 month- chest tightness- denies wheezing- c/o irregular heartbeat last week Is Patient Diabetic? No Comments Medications reviewed with patient Abigail Miyamoto RN  September 30, 2008 11:12 AM    Physical Exam  Additional Exam:  WD, WN, 50y/o WF in NAD... GENERAL:  Alert, oriented & cooperative... HEENT:  [place]/AT, EOM-wnl, PERRLA, Fundi-benign, EACs-clear, TMs-wnl, NOSE-clear, THROAT-clear & wnl. NECK:  Supple w/ full ROM; no  JVD; normal carotid impulses w/o bruits; no thyromegaly or nodules palpated; no lymphadenopathy. CHEST:  Clear to P & A; without wheezes/ rales/ or rhonchi. HEART:  Regular Rhythm; without murmurs/ rubs/ or gallops. ABDOMEN:  Soft & nontender; normal bowel sounds; no organomegaly or masses detected. EXT: without deformities or arthritic changes; no varicose veins/ venous insuffic/ or edema. multiple postive trigger points.   NEURO:  CN's intact; no focal neuro deficits...   EKG /w no acute changes noted.    Impression & Recommendations:  Problem # 1:  CHEST PAIN (ICD-786.50)  Pt w/ intermittent chest tightness and palpitations in female with several risk factors, EKG with no acute changes. Supect this is multifactoral  possible GERD, anxiety. However due to risk factors, will refer to card for evaluation, appreciate their input.  REC:   Decrease caffeine.  Stop smoking.  Stress reducers.  Add pepcid 20mg  at bedtime  GERD diet.  Avoid eating 4 hr prior to  bedtime.  follow up Dr. Kriste Basque in 3 months.  Please contact office for sooner follow up if symptoms do not improve or worsen   Orders: Cardiology Referral (Cardiology) Est. Patient Level IV (53664)  Complete Medication List: 1)  Synthroid 200 Mcg Tabs (Levothyroxine sodium) .Marland Kitchen.. 1 by mouth once daily 2)  Prevacid 30 Mg Cpdr (Lansoprazole) .... Take 1 tablet by mouth once a day 3)  Wellbutrin Xl 300 Mg Tb24 (Bupropion hcl) .... Take 1 tablet by mouth once a day 4)  Celexa 40 Mg Tabs (Citalopram hydrobromide) .... Take 1 tablet by mouth once a day 5)  Alprazolam 0.5 Mg Tabs (Alprazolam) .... As needed four times a day  not to exceed 4 tabs per day and no early refills 6)  Skelaxin 800 Mg Tabs (Metaxalone) .Marland Kitchen.. 1 by mouth three times a day as needed 7)  Vicodin 5-500 Mg Tabs (Hydrocodone-acetaminophen) .Marland Kitchen.. 1 by mouth every 6-8 hours as needed  no early refiils  do not exceed 3  tabs per day 8)  Mobic 15 Mg Tabs (Meloxicam) .Marland Kitchen.. 1 by mouth once daily as needed joint pain 9)  Zovirax 5 % Oint (Acyclovir) .... Apply to fever blister every 3-4 hours 10)  Zocor 20 Mg Tabs (Simvastatin) .... Take 1 tab by mouth at bedtime.  return for office visit and fasting labs week of 10-19-08.  Patient Instructions: 1)  We are referring you to cardiology for evaluation, if symptoms worsen or do not resolve call back to office or go to [name].  2)  Decrease caffeine.  3)  Stop smoking.  4)  Stress reducers.  5)  Add pepcid 20mg  at bedtime  6)  GERD diet.  7)  Avoid eating 4 hr prior to bedtime.  8)  follow up Dr. Kriste Basque in 3 months.  9)  Please contact office for sooner follow up if symptoms do not improve or worsen   Appended Document: ekg order     Clinical Lists Changes  Orders: Added new Service order of EKG w/ Interpretation (93000) - Signed

## 2010-03-08 NOTE — Progress Notes (Signed)
Summary: Triage   Phone Note Call from Patient Call back at Home Phone [phone]   Caller: Patient Call For: Edem Tiegs Reason for Call: Talk to Nurse Details for Reason: New Med not working Summary of Call: Carafate started about 2-3 wks  Initial call taken by: Darryl Lent CMA Duncan Dull),  November 26, 2008 9:35 AM  Follow-up for Phone Call        Left message for patient to call back Darcey Nora RN, [facility]  November 26, 2008 10:48 AM  Patient c/o belching after meals, "feels like I'm boiling inside" Carafate hasn't helped.  Pain and burning is worse after meals.  She says she has decreased her caffeine intake but is still smoking. Please advise.  The aciphex and all other PPI's haven't helped.  Please advise Follow-up by: Darcey Nora RN, CGRN,  November 27, 2008 9:11 AM  Additional Follow-up for Phone Call Additional follow up Details #1::        I do not have any other recommendations at this time. Would recommend referral to Dr. Baldo Daub (? spelling) at [facility] Additional Follow-up by: Iva Boop MD, Clementeen Graham,  November 27, 2008 9:18 AM    Additional Follow-up for Phone Call Additional follow up Details #2::    Left message for patient to call back Follow-up by: Darcey Nora RN, CGRN,  November 27, 2008 9:47 AM  Additional Follow-up for Phone Call Additional follow up Details #3:: Details for Additional Follow-up Action Taken: Left message for patient to call back Darcey Nora RN, [facility]  November 30, 2008 8:50 AM  Left message for patient to call back Darcey Nora RN, [facility]  December 01, 2008 9:55 AM  No return call from patient, I will await a return phone call from the patient.  i have left her Dr Marvell Fuller recommendations on a voicemail. Additional Follow-up by: Darcey Nora RN, CGRN,  December 02, 2008 10:20 AM

## 2010-03-08 NOTE — Progress Notes (Signed)
Summary: NEEDS REFILLS  Phone Note Call from Patient Call back at [phone]   Caller: Patient Call For: NADEL Summary of Call: PT HAS NOT RECIEVED HER MEDS FROM [facility] YET THEY SAID WE HAVE NOT GIVING THE OK Initial call taken by: Lacinda Axon,  March 19, 2008 1:00 PM  Follow-up for Phone Call        which meds??? LMOMTCB Vernie Murders  March 19, 2008 2:11 PM   Additional Follow-up for Phone Call Additional follow up Details #1::        Spoke with pt. She needs refills on alprazolam, vicodin, celexa, mirapex, and wellbutrin.  These were sent to pharm.  Pt aware that she can not pick up controlled meds until 03-20-08.  Additional Follow-up by: Vernie Murders,  March 19, 2008 2:24 PM      Prescriptions: MIRAPEX 0.125 MG  TABS (PRAMIPEXOLE DIHYDROCHLORIDE) take 2 tablets daily  #60 Each x 5   Entered by:   Vernie Murders   Authorized by:   [name] MD   Signed by:   Vernie Murders on 03/19/2008   Method used:   Telephoned to ...       [facility] [place] [phone][facility] (retail)       [phone], [place]  [address]       Ph: [phone] or [phone]       Fax: [phone]   RxID:   [id number] VICODIN 5-500 MG  TABS (HYDROCODONE-ACETAMINOPHEN) 1 by mouth every 6-8 hours as needed  NO EARLY REFIILS  DO NOT EXCEED 3  TABS PER DAY  #90 x 0   Entered by:   Vernie Murders   Authorized by:   [name] MD   Signed by:   Vernie Murders on 03/19/2008   Method used:   Telephoned to ...       [facility] [place] [phone]* (retail)       [phone], [place]  [address]       Ph: [phone] or [phone]       Fax: [phone]   RxID:   [id number] ALPRAZOLAM 0.5 MG  TABS (ALPRAZOLAM) as needed four times a day  not to exceed 4 tabs per day and no early refills  #120 x 0   Entered by:   Vernie Murders   Authorized by:   [name] MD   Signed by:   Vernie Murders on 03/19/2008   Method used:   Telephoned to ...       [facility]  [place] [phone]* (retail)       [phone], [place]  [address]       Ph: [phone] or [phone]       Fax: [phone]   RxID:   [id number]

## 2010-03-08 NOTE — Assessment & Plan Note (Signed)
Summary: NEW ENDO CONSULT/THYROID/ [facility]  #   Vital Signs:  Patient profile:   50 year old female Height:      67 inches (170.18 cm) Weight:      187.38 pounds (85.17 kg) O2 Sat:      94 % on Room air Temp:     97.7 degrees F (36.50 degrees C) oral Pulse rate:   77 / minute BP sitting:   118 / 82  (left arm) Cuff size:   regular  Vitals Entered By: Josph Macho RMA (April 20, 2009 10:07 AM)  O2 Flow:  Room air CC: New Endo: Thyroid/ pt states she is no longer taking Rolaids, Tums, Maalox, Zovirax, or Zithromax/ CF Is Patient Diabetic? No   Primary Provider:  Alroy Dust, MD  CC:  New Endo: Thyroid/ pt states she is no longer taking Rolaids, Tums, Maalox, Zovirax, and or Zithromax/ CF.  History of Present Illness: pt states she was dx'ed with hypothyroidism at age 30, and she has been on medication ever since then.  she takes synthroid 125 micrograms/day, x approx 1 month.  she had been on 200 micrograms/day prior to the reduction. sympomatically, pt states few years of severe fatigue, and associated numbness of the feet.  she also has myalgias, worst on the legs.  Current Medications (verified): 1)  Synthroid 125 Mcg Tabs (Levothyroxine Sodium) .Marland Kitchen.. 1 By Mouth Once Daily 2)  Dexilant 60 Mg Cpdr (Dexlansoprazole) .... Take 1 Cap By Mouth Once Daily- 30 Min Before The 1st Meal of The Day... 3)  Vicodin 5-500 Mg  Tabs (Hydrocodone-Acetaminophen) .Marland Kitchen.. 1 By Mouth Every 6-8 Hours As Needed  No Early Refiils  Do Not Exceed 3  Tabs Per Day 4)  Mobic 15 Mg Tabs (Meloxicam) .Marland Kitchen.. 1 By Mouth Once Daily As Needed Joint Pain 5)  Skelaxin 800 Mg  Tabs (Metaxalone) .Marland Kitchen.. 1 By Mouth Three Times A Day As Needed For Muscle Spasm... 6)  Wellbutrin Xl 300 Mg  Tb24 (Bupropion Hcl) .... Take 1 Tablet By Mouth Once A Day 7)  Celexa 40 Mg  Tabs (Citalopram Hydrobromide) .... Take 1 Tablet By Mouth Once A Day 8)  Alprazolam 0.5 Mg  Tabs (Alprazolam) .... As Needed Four Times A Day  Not To Exceed 4 Tabs  Per Day and No Early Refills 9)  Zovirax 5 % Oint (Acyclovir) .... Apply To Fever Blister Every 3-4 Hours 10)  Tums 500 Mg Chew (Calcium Carbonate Antacid) .... Take As Needed 11)  Rolaids 334 Mg Chew (Dihydroxyaluminum Sod Carb) .... Take As Needed 12)  Maalox 600 Mg Chew (Calcium Carbonate Antacid) .... Take As Needed 13)  Zithromax Z-Pak 250 Mg Tabs (Azithromycin) .... Take As Directed  Allergies (verified): No Known Drug Allergies  Past History:  Past Medical History: Last updated: 02/04/2009  GLOBUS (ICD-300.11) Hx of TMJ PAIN (ICD-524.62) CIGARETTE SMOKER (ICD-305.1) ? of MITRAL VALVE PROLAPSE (ICD-424.0) CHEST PAIN (ICD-786.50) HYPERCHOLESTEROLEMIA (ICD-272.0) HYPOTHYROIDISM (ICD-244.9) OBESITY, MILD (ICD-278.02) GERD (ICD-530.81) DYSPHAGIA UNSPECIFIED (ICD-787.20) IBS (ICD-564.1) LIVER FUNCTION TESTS, ABNORMAL (ICD-794.8) FIBROMYALGIA (ICD-729.1) HEADACHE, CHRONIC (ICD-784.0) PERIODIC LIMB MOVEMENT DISORDER (ICD-327.51) ANXIETY (ICD-300.00) DEPRESSION (ICD-311)  Family History: Reviewed history from 06/24/2008 and no changes required. mother alive age 61  hx of chol. and heart problems father alive age 68  hx of DM and heart problems 1 brother alive age 14 1 sister alive age 52 1 sister alive age 52 Family History of Colon Cancer: Paternal Aunt Family History of Breast Cancer: Maternal Aunt x 2 Family History  of Stomach Cancer: Paternal Aunt, maternal grandmother Family History of Diabetes: Father, aunt x 2, uncles x 2 Family History of Heart Disease: Grandmother, Father, Aunt, Emelia Loron, Grandmother (paternal and maternal) Family History of Kidney Disease: cousin no thyroid dz  Social History: Reviewed history from 10/19/2008 and no changes required. disabled smoker -1 ppd married 2 children Alcohol Use - yes-occasionally Daily Caffeine Use-5 glasses a day Illicit Drug Use - no Patient does not get regular exercise.   Review of Systems       The  patient complains of weight gain and depression.         denies hair loss, sob, syncope.  she reports blurry vision, dry skin, myalgias, memory loss, lightheadedness, constipation, and forgetfulness.   Physical Exam  General:  normal appearance.   Head:  head: no deformity eyes: no periorbital swelling, no proptosis external nose and ears are normal mouth: no lesion seen Neck:  Supple without thyroid enlargement or tenderness.  Lungs:  Clear to auscultation bilaterally. Normal respiratory effort.  Heart:  Regular rate and rhythm without murmurs or gallops noted. Normal S1,S2.   Msk:  muscle bulk and strength are grossly normal.  no obvious joint swelling.  gait is normal and steady  Extremities:  no deformity no edema Neurologic:  cn 2-12 grossly intact.   readily moves all 4's.   sensation is intact to touch on all 4's Skin:  normal texture and temp.  no rash.  not diaphoretic  Cervical Nodes:  No significant adenopathy.  Psych:  Alert and cooperative; normal mood and affect; normal attention span and concentration.   Additional Exam:  FastTSH              [H]  10.95 uIU/mL      Impression & Recommendations:  Problem # 1:  HYPOTHYROIDISM (ICD-244.9)  needs increased rx  Orders: Est. Patient 40-64 years (52841)  Problem # 2:  fatigue possibly thyroid-related  Problem # 3:  myalgias unlikely thyroid-related  Medications Added to Medication List This Visit: 1)  Levothyroxine Sodium 150 Mcg Tabs (Levothyroxine sodium) .Marland Kitchen.. 1 once daily  Other Orders: Promethazine up to 50mg  (J2550) Admin of Therapeutic Inj  intramuscular or subcutaneous (32440)  Patient Instructions: 1)  increase levothyroxine to 150 micrograms/day. 2)  recheck tsh in 1 month 244.9. (also b-12 782.0). 3)  tests are being ordered for you today.  a few days after the test(s), please call [phone] to hear your test results. Prescriptions: LEVOTHYROXINE SODIUM 150 MCG TABS (LEVOTHYROXINE SODIUM) 1 once  daily  #30 x 11   Entered and Authorized by:   Minus Breeding MD   Signed by:   Minus Breeding MD on 04/20/2009   Method used:   Electronically to        [facility] (retail)       [address]       [place], [place]  [address]       Ph: [phone]       Fax: [phone]   RxID:   [id number]    Medication Administration  Injection # 1:    Medication: Promethazine up to 50mg     Diagnosis: HEADACHE, CHRONIC (ICD-784.0)    Route: IM    Site: L deltoid    Exp Date: 10/2010    Lot #: [id number]    Mfr: Sander Radon plus    Comments: 25mg  given/ CF    Patient tolerated injection without  complications    Given by: Josph Macho RMA (April 20, 2009 10:15 AM)  Orders Added: 1)  Promethazine up to 50mg  [J2550] 2)  Admin of Therapeutic Inj  intramuscular or subcutaneous [96372] 3)  Est. Patient 40-64 years [16109]

## 2010-03-08 NOTE — Assessment & Plan Note (Signed)
Summary: follow up   Primary Provider/Referring Provider:  Alroy Dust, MD  CC:  refills. .  History of Present Illness: 50  year old female patient of Dr. Kriste Basque that has known history of hypothyroidism, chronic pain syndrome, fibromyalgia, chronic headache previous evaluation at headache clinic.  Use chronic pain meds on regular basis.    7/09-- .. since I saw her last in Dec08 she has persisted w/ her complaints of fatigue, aches, pains, and soreness all over- characteristic of her Fibromyalgia... she was prev eval by DrDeveshwar for Rheum but hasn't had a follow up in some time and she has stopped the Flexeril med... she continues to smoke 1ppd and isn't interested in smoking cessation help... she states that she's depressed and has prev seen a psychiatrist but can't remember who it was... she has mult somatic complaints and notes dysphagia and wants a GI referral to evaluate this further...   January 28, 2008 -- Complains over last 3 months having increased joint pain in hips, and knees. Aches at times, increased in cold weather and over last 2 weeks. takes otc with only mild relief. vicodin helps  September 03, 2008--Returns for follow up and med refills. Complains that headaches are getting worse. Uses vicodin w/ some help. Has hx of MHA, w/ associated aura , prev seen by neuro She has been to Headache clinic in past , prev. on several meds to help with prevention incluidng Topamax. Declines referral to headache clinic. Associated nausea, photosensitivty, w/ several a week. Pt is fasitng today for labs. Complains of diffuse muscle aches, fatigue, has Fibromyaglia. Discussed various options of treatment incluidsing joga, stress reducers. Denies chest pain, dyspnea, orthopnea, hemoptysis, fever, n/v/d, edema.   Current Medications (verified): 1)  Synthroid 200 Mcg  Tabs (Levothyroxine Sodium) .Marland Kitchen.. 1 By Mouth Once Daily 2)  Prevacid 30 Mg  Cpdr (Lansoprazole) .... Take 1 Tablet By Mouth Once A  Day 3)  Wellbutrin Xl 300 Mg  Tb24 (Bupropion Hcl) .... Take 1 Tablet By Mouth Once A Day 4)  Celexa 40 Mg  Tabs (Citalopram Hydrobromide) .... Take 1 Tablet By Mouth Once A Day 5)  Alprazolam 0.5 Mg  Tabs (Alprazolam) .... As Needed Four Times A Day  Not To Exceed 4 Tabs Per Day and No Early Refills 6)  Skelaxin 800 Mg  Tabs (Metaxalone) .Marland Kitchen.. 1 By Mouth Three Times A Day As Needed 7)  Vicodin 5-500 Mg  Tabs (Hydrocodone-Acetaminophen) .Marland Kitchen.. 1 By Mouth Every 6-8 Hours As Needed  No Early Refiils  Do Not Exceed 3  Tabs Per Day 8)  Mobic 15 Mg Tabs (Meloxicam) .Marland Kitchen.. 1 By Mouth Once Daily As Needed Joint Pain 9)  Zovirax 5 % Oint (Acyclovir) .... Apply To Fever Blister Every 3-4 Hours  Allergies (verified): No Known Drug Allergies  Past History:  Past Surgical History: Last updated: 04/06/2008 S/P ELap w/ lysis of adhesions & excision of hydatid cyst left tube 1992 S/P supracervical hysterectomy  by DrFore in 2002 Tonsillectomy Arm Surgery-right Wrist surgery-right Foot surgery-Left  Family History: Last updated: 06/24/2008 mother alive age 27  hx of chol. and heart problems father alive age 23  hx of DM and heart problems 1 brother alive age 27 1 sister alive age 43 1 sister alive age 71 Family History of Colon Cancer: Paternal Aunt Family History of Breast Cancer: Maternal Aunt x 2 Family History of Stomach Cancer: Paternal Aunt, maternal grandmother Family History of Diabetes: Father, aunt x 2, uncles x 2 Family History  of Heart Disease: Grandmother, Father, Celine Ahr, Emelia Loron, Grandmother (paternal and maternal) Family History of Kidney Disease: cousin  Social History: Last updated: 04/06/2008 disabled smoker -1 ppd married 2 children Alcohol Use - yes-occasionally Daily Caffeine Use-15-20 cups per day Illicit Drug Use - no Patient does not get regular exercise.   Past Medical History: Hx of TMJ PAIN (ICD-524.62) CIGARETTE SMOKER (ICD-305.1) - she continues to smoke  1ppd... MITRAL VALVE PROLAPSE (ICD-424.0)  ~  2DEcho 10/06 was normal without MVP seen, no MR, norm LVF... prev 2D in 1990 w/ mild bowing of ant leaflet c/w mild MVP at that time...  ~  NuclearStressTest 2/02 was WNL- no ischemia, no infarction, EF=73%... HYPERCHOLESTEROLEMIA (ICD-272.0) - hx of Chol values >300 in the past (esp when thyroid was underactive)... she has been inconsistant w/ f/u labs... HYPOTHYROIDISM (ICD-244.9) - on SYNTHROID 217mcg/d... hypothyroidism initially diagnosed in the 1980's and she's been on meds ever since then...   ~  labs 12/08 showed TSH= 31 ? off her meds... rec to restart her Synthroid regularly... GERD (ICD-530.81) & DYSPHAGIA UNSPECIFIED (ICD-787.20) - prev GI eval by DrSamLeBauer-  IBS (ICD-564.1) - colonoscopy 3/93 by DrSam was WNL...symptoms were felt to be IBS...  ~  CTAbd 8/06 showed fatty liver, neg abd, s/p hyst, left ovarian cyst, norm right ovary... LIVER FUNCTION TESTS, ABNORMAL (ICD-794.8) - she had second GI opinion and eval of Abn LFT's by DrBuccini in 2002... he felt she had Fatty Liver disease... -Chronic Headache- prev eval w/ neuro, Dr. Lissa Morales.        Vital Signs:  Patient profile:   50 year old female Height:      67 inches Weight:      186.13 pounds O2 Sat:      96 % on Room air Temp:     98.2 degrees F oral Pulse rate:   76 / minute BP sitting:   100 / 70  (left arm) Cuff size:   regular  Vitals Entered By: Zackery Barefoot CMA (September 03, 2008 10:43 AM)  O2 Flow:  Room air CC: refills.  Comments Medications reviewed with patient Zackery Barefoot CMA  September 03, 2008 10:43 AM    Physical Exam  Additional Exam:  WD, WN, 50y/o WF in NAD... GENERAL:  Alert, oriented & cooperative... HEENT:  [place]/AT, EOM-wnl, PERRLA, Fundi-benign, EACs-clear, TMs-wnl, NOSE-clear, THROAT-clear & wnl. NECK:  Supple w/ full ROM; no JVD; normal carotid impulses w/o bruits; no thyromegaly or nodules palpated; no lymphadenopathy. CHEST:  Clear to P & A;  without wheezes/ rales/ or rhonchi. HEART:  Regular Rhythm; without murmurs/ rubs/ or gallops. ABDOMEN:  Soft & nontender; normal bowel sounds; no organomegaly or masses detected. EXT: without deformities or arthritic changes; no varicose veins/ venous insuffic/ or edema. multiple postive trigger points.   NEURO:  CN's intact; no focal neuro deficits...     Impression & Recommendations:  Problem # 1:  HYPERCHOLESTEROLEMIA (ICD-272.0)  fasting labs pending.  diet and exercise discussed.   Orders: TLB-TSH (Thyroid Stimulating Hormone) (84443-TSH) TLB-Hepatic/Liver Function Pnl (80076-HEPATIC) TLB-Lipid Panel (80061-LIPID) Est. Patient Level IV (65784)  Problem # 2:  HYPOTHYROIDISM (ICD-244.9) labs pending.  Orders: TLB-TSH (Thyroid Stimulating Hormone) (84443-TSH)  Her updated medication list for this problem includes:    Synthroid 200 Mcg Tabs (Levothyroxine sodium) .Marland Kitchen... 1 by mouth once daily  Problem # 3:  FIBROMYALGIA (ICD-729.1)  dicussed various treatment options  continue on meds.  Her updated medication list for this problem includes:    Skelaxin 800 Mg  Tabs (Metaxalone) .Marland Kitchen... 1 by mouth three times a day as needed    Vicodin 5-500 Mg Tabs (Hydrocodone-acetaminophen) .Marland Kitchen... 1 by mouth every 6-8 hours as needed  no early refiils  do not exceed 3  tabs per day    Mobic 15 Mg Tabs (Meloxicam) .Marland Kitchen... 1 by mouth once daily as needed joint pain  Orders: Est. Patient Level IV (29518)  Problem # 4:  ANXIETY (ICD-300.00)  discussed stress reducers. continue on same meds.  Her updated medication list for this problem includes:    Wellbutrin Xl 300 Mg Tb24 (Bupropion hcl) .Marland Kitchen... Take 1 tablet by mouth once a day    Celexa 40 Mg Tabs (Citalopram hydrobromide) .Marland Kitchen... Take 1 tablet by mouth once a day    Alprazolam 0.5 Mg Tabs (Alprazolam) .Marland Kitchen... As needed four times a day  not to exceed 4 tabs per day and no early refills  Orders: Est. Patient Level IV (84166)  Problem # 5:   HEADACHE, CHRONIC (ICD-784.0)  persistent headaches requests referral to neuro, decline headache clinic.  headache education reviewed.   The following medications were removed from the medication list:    Midrin 325-65-100 Mg Caps (Apap-isometheptene-dichloral) ..... Once daily as needed headache Her updated medication list for this problem includes:    Vicodin 5-500 Mg Tabs (Hydrocodone-acetaminophen) .Marland Kitchen... 1 by mouth every 6-8 hours as needed  no early refiils  do not exceed 3  tabs per day    Mobic 15 Mg Tabs (Meloxicam) .Marland Kitchen... 1 by mouth once daily as needed joint pain  Orders: TLB-BMP (Basic Metabolic Panel-BMET) (80048-METABOL) TLB-CBC Platelet - w/Differential (85025-CBCD) Est. Patient Level IV (06301)  Complete Medication List: 1)  Synthroid 200 Mcg Tabs (Levothyroxine sodium) .Marland Kitchen.. 1 by mouth once daily 2)  Prevacid 30 Mg Cpdr (Lansoprazole) .... Take 1 tablet by mouth once a day 3)  Wellbutrin Xl 300 Mg Tb24 (Bupropion hcl) .... Take 1 tablet by mouth once a day 4)  Celexa 40 Mg Tabs (Citalopram hydrobromide) .... Take 1 tablet by mouth once a day 5)  Alprazolam 0.5 Mg Tabs (Alprazolam) .... As needed four times a day  not to exceed 4 tabs per day and no early refills 6)  Skelaxin 800 Mg Tabs (Metaxalone) .Marland Kitchen.. 1 by mouth three times a day as needed 7)  Vicodin 5-500 Mg Tabs (Hydrocodone-acetaminophen) .Marland Kitchen.. 1 by mouth every 6-8 hours as needed  no early refiils  do not exceed 3  tabs per day 8)  Mobic 15 Mg Tabs (Meloxicam) .Marland Kitchen.. 1 by mouth once daily as needed joint pain 9)  Zovirax 5 % Oint (Acyclovir) .... Apply to fever blister every 3-4 hours  Patient Instructions: 1)  Continue on same meds 2)  We are referring you to neuro for evaluation of headaches.  3)  I will call with lab results.  4)  follow up Dr. Kriste Basque in 3 months  5)  Please contact office for sooner follow up if symptoms do not improve or worsen  Prescriptions: VICODIN 5-500 MG  TABS  (HYDROCODONE-ACETAMINOPHEN) 1 by mouth every 6-8 hours as needed  NO EARLY REFIILS  DO NOT EXCEED 3  TABS PER DAY  #90 x 3   Entered by:   Zackery Barefoot CMA   Authorized by:   Rubye Oaks NP   Signed by:   Zackery Barefoot CMA on 09/03/2008   Method used:   Telephoned to ...       [id number] (retail)  [address]       [place], [place]  [address]       Ph: [phone]       Fax: [phone]   RxID:   [id number] ALPRAZOLAM 0.5 MG  TABS (ALPRAZOLAM) as needed four times a day  not to exceed 4 tabs per day and no early refills  #120 x 3   Entered by:   Zackery Barefoot CMA   Authorized by:   Rubye Oaks NP   Signed by:   Zackery Barefoot CMA on 09/03/2008   Method used:   Telephoned to ...       [id number]* (retail)       [address] [place], [place]  [address]       Ph: [phone]       Fax: [phone]   RxID:   [id number] CELEXA 40 MG  TABS (CITALOPRAM HYDROBROMIDE) Take 1 tablet by mouth once a day  #30 Tablet x 5   Entered and Authorized by:   Rubye Oaks NP   Signed by:   Deklynn Charlet NP on 09/03/2008   Method used:   Electronically to        [id number] (retail)       [address] [place], [place]  [address]       Ph: [phone]       Fax: [phone]   RxID:   [id number] WELLBUTRIN XL 300 MG  TB24 (BUPROPION HCL) Take 1 tablet by mouth once a day  #30 Tablet x 5   Entered and Authorized by:   Rubye Oaks NP   Signed by:   Marlee Armenteros NP on 09/03/2008   Method used:   Electronically to        [id number] (retail)       [address] [place], [place]  [address]       Ph: [phone]       Fax: [phone]   RxID:   [id number] PREVACID 30 MG  CPDR (LANSOPRAZOLE) Take 1 tablet by mouth once a day  #30 x 6   Entered and Authorized by:   Rubye Oaks NP   Signed by:   Esias Mory NP on 09/03/2008   Method used:   Electronically to          [id number]* (retail)       [address] [place], [place]  [address]       Ph: [phone]       Fax: [phone]   RxID:   [id number] SYNTHROID 200 MCG  TABS (LEVOTHYROXINE SODIUM) 1 by mouth once daily Brand medically necessary #30 x 6   Entered and Authorized by:   Rubye Oaks NP   Signed by:   Maryn Freelove NP on 09/03/2008   Method used:   Electronically to        [id number] (retail)       [address] [place], [place]  [address]       Ph: [phone]       Fax: [phone]   RxID:   [id number]   Appended Document: follow up appt to see [email or web address]  [facility] for headaches 10/27/08@8 :30am pt aware

## 2010-03-10 NOTE — Progress Notes (Signed)
Summary: referral  Phone Note Call from Patient Call back at Home Phone [phone]   Caller: Patient Call For: Dr.Daiya Tamer Summary of Call: patient phoned and would like a referral to Dr. Brynda Peon. She states that she has a lump in her throat and it feels like she is swallowing golf balls. Patient can be reached [phone] Initial call taken by: Vedia Coffer,  February 11, 2010 10:42 AM  Follow-up for Phone Call        Spoke with pt and she is c/o feelin glike she is swallowing golf balls when she drinks or eats anything. She has seen Dr. Leone Payor about this in the past and he referred her to [facility] last year but the pt cancelled the appt becuase her symptoms had improved at the time. Pt states now the sensation has come back and is worse. She states she feels like food and liquid gets stuck inher throat and it feels like her tongue it the back of her throat is "suffocating" her. Pt is requesting a referral to Dr. Pollyann Kennedy to be set up ASAP. Please advsie. Carron Curie CMA  February 11, 2010 11:23 AM   Additional Follow-up for Phone Call Additional follow up Details #1::        per SN----ok for referral to see Dr. Pollyann Kennedy for throat eval---order has been placed. Randell Loop CMA  February 11, 2010 11:41 AM   pt aware order placed.Carron Curie CMA  February 11, 2010 11:45 AM

## 2010-03-10 NOTE — Progress Notes (Signed)
Summary: Triage   Phone Note Call from Patient Call back at Home Phone [phone]   Caller: Patient Call For: Dr. Leone Payor Reason for Call: Talk to Nurse Summary of Call: Pt is having a lot of trouble swallowing, when she does swallow it feels like she is swallowing golf balls, also feels a lump on left side of throat  Initial call taken by: [place] Johnson,  February 09, 2010 4:37 PM  Follow-up for Phone Call        Left message to call back Darcey Nora RN, [facility]  February 10, 2010 10:02 AM  Patient is having problems with her swallowing again.  She has decided she wants to now go to [facility] as scheduled last January.  She was sent a letter by Dr Leone Payor on 03/2009 asking her to go see [facility] for eval.  Patient is asking for the information to [facility] She was scheduled to see Dr Baldo Daub 03/01/2009.  She will try and call and set up an appointment. Follow-up by: Darcey Nora RN, CGRN,  February 10, 2010 10:46 AM  Additional Follow-up for Phone Call Additional follow up Details #1::        ok please send any records they would need een if sent before Additional Follow-up by: Iva Boop MD, Clementeen Graham,  February 10, 2010 10:54 AM

## 2010-03-11 NOTE — Consult Note (Signed)
Summary: [facility]   Imported By: Lester Hopewell 11/04/2008 10:31:05  _____________________________________________________________________  External Attachment:    Type:   Image     Comment:   External Document

## 2010-03-24 NOTE — Consult Note (Signed)
Summary: [facility]   Imported By: Sherian Rein 03/18/2010 15:05:30  _____________________________________________________________________  External Attachment:    Type:   Image     Comment:   External Document

## 2010-03-29 ENCOUNTER — Telehealth (INDEPENDENT_AMBULATORY_CARE_PROVIDER_SITE_OTHER): Payer: Self-pay | Admitting: *Deleted

## 2010-04-04 ENCOUNTER — Telehealth: Payer: Self-pay | Admitting: Pulmonary Disease

## 2010-04-05 ENCOUNTER — Other Ambulatory Visit: Payer: Self-pay | Admitting: Pulmonary Disease

## 2010-04-05 ENCOUNTER — Other Ambulatory Visit: Payer: BC Managed Care – PPO

## 2010-04-05 ENCOUNTER — Ambulatory Visit (INDEPENDENT_AMBULATORY_CARE_PROVIDER_SITE_OTHER)
Admission: RE | Admit: 2010-04-05 | Discharge: 2010-04-05 | Disposition: A | Discharge status: Home / Self Care | Payer: Medicare Other | Source: Ambulatory Visit | Attending: Pulmonary Disease | Admitting: Pulmonary Disease

## 2010-04-05 ENCOUNTER — Encounter (INDEPENDENT_AMBULATORY_CARE_PROVIDER_SITE_OTHER): Payer: Self-pay | Admitting: *Deleted

## 2010-04-05 ENCOUNTER — Encounter: Payer: Self-pay | Admitting: Pulmonary Disease

## 2010-04-05 DIAGNOSIS — D649 Anemia, unspecified: Secondary | ICD-10-CM

## 2010-04-05 DIAGNOSIS — E039 Hypothyroidism, unspecified: Secondary | ICD-10-CM

## 2010-04-05 DIAGNOSIS — R748 Abnormal levels of other serum enzymes: Secondary | ICD-10-CM

## 2010-04-05 DIAGNOSIS — R079 Chest pain, unspecified: Secondary | ICD-10-CM

## 2010-04-05 DIAGNOSIS — I10 Essential (primary) hypertension: Secondary | ICD-10-CM

## 2010-04-05 DIAGNOSIS — M1990 Unspecified osteoarthritis, unspecified site: Secondary | ICD-10-CM

## 2010-04-05 LAB — HEPATIC FUNCTION PANEL
ALT: 36 U/L — ABNORMAL HIGH (ref 0–35)
AST: 34 U/L (ref 0–37)
Albumin: 4.2 g/dL (ref 3.5–5.2)
Alkaline Phosphatase: 75 U/L (ref 39–117)
Bilirubin, Direct: 0.1 mg/dL (ref 0.0–0.3)
Total Bilirubin: 0.7 mg/dL (ref 0.3–1.2)
Total Protein: 7.2 g/dL (ref 6.0–8.3)

## 2010-04-05 LAB — BASIC METABOLIC PANEL
BUN: 8 mg/dL (ref 6–23)
CO2: 27 mEq/L (ref 19–32)
Calcium: 9.1 mg/dL (ref 8.4–10.5)
Chloride: 103 mEq/L (ref 96–112)
Creatinine, Ser: 0.7 mg/dL (ref 0.4–1.2)
GFR: 100.34 mL/min (ref >60.00)
Glucose, Bld: 95 mg/dL (ref 70–99)
Potassium: 4.1 mEq/L (ref 3.5–5.1)
Sodium: 140 mEq/L (ref 135–145)

## 2010-04-05 LAB — CBC WITH DIFFERENTIAL/PLATELET
Basophils Absolute: 0 10*3/uL (ref 0.0–0.1)
Basophils Relative: 0.4 % (ref 0.0–3.0)
Eosinophils Absolute: 0 10*3/uL (ref 0.0–0.7)
Eosinophils Relative: 0 % (ref 0.0–5.0)
HCT: 41.4 % (ref 36.0–46.0)
Hemoglobin: 14.3 g/dL (ref 12.0–15.0)
Lymphocytes Relative: 30.1 % (ref 12.0–46.0)
Lymphs Abs: 2.6 10*3/uL (ref 0.7–4.0)
MCHC: 34.5 g/dL (ref 30.0–36.0)
MCV: 96.6 fl (ref 78.0–100.0)
Monocytes Absolute: 0.4 10*3/uL (ref 0.1–1.0)
Monocytes Relative: 5 % (ref 3.0–12.0)
Neutro Abs: 5.6 10*3/uL (ref 1.4–7.7)
Neutrophils Relative %: 64.5 % (ref 43.0–77.0)
Platelets: 217 10*3/uL (ref 150.0–400.0)
RBC: 4.29 Mil/uL (ref 3.87–5.11)
RDW: 13.3 % (ref 11.5–14.6)
WBC: 8.7 10*3/uL (ref 4.5–10.5)

## 2010-04-05 LAB — TSH: TSH: 29.39 u[IU]/mL — ABNORMAL HIGH (ref 0.35–5.50)

## 2010-04-05 LAB — SEDIMENTATION RATE: Sed Rate: 38 mm/hr — ABNORMAL HIGH (ref 0–22)

## 2010-04-05 NOTE — Progress Notes (Signed)
Summary: would like prescription called in coughing wheezing--augment. rx  Phone Note Call from Patient Call back at Home Phone [phone]   Caller: Patient Call For: nadel Summary of Call: patient phoned and would like a prescription called into [facility] on [address]. She had head and chest congestion. She is coughing like a thick yellow phlegm she stated that she is also wheezing. Patient can be reached at [phone] Initial call taken by: Vedia Coffer,  March 29, 2010 5:06 PM  Follow-up for Phone Call        Pt c/o chest congestion w/ thick yellow sputum and wheezing x2 days. No fever, sore throat or sob. Pt informed that SN has left for the day and will have to address msg in the morning. Pt instructed to go to nearest ER or urgent care if her sxs get worse.Michel Bickers [facility]  March 29, 2010 5:23 PM  Additional Follow-up for Phone Call Additional follow up Details #1::        per SN----ok for pt to have augmentin 875mg    #14  1 by mouth two times a day until gone.  meds have been sent to the pharmacy---lmomtcb  Randell Loop [facility]  March 30, 2010 2:59 PM     Additional Follow-up for Phone Call Additional follow up Details #2::    pt called back.  informed her rx sent to pharmacy.  Aundra Millet Reynolds LPN  March 31, 2010 2:06 PM   New/Updated Medications: AUGMENTIN 875-125 MG TABS (AMOXICILLIN-POT CLAVULANATE) take one tablet by mouth two times a day until gone Prescriptions: AUGMENTIN 875-125 MG TABS (AMOXICILLIN-POT CLAVULANATE) take one tablet by mouth two times a day until gone  #14 x 0   Entered by:   Randell Loop CMA   Authorized by:   [name] MD   Signed by:   Arman Filter LPN on 09/81/1914   Method used:   Electronically to        [facility] (retail)       [address]       [place], [place]  [address]       Ph: [phone]       Fax: [phone]   RxID:   [id number]

## 2010-04-06 ENCOUNTER — Telehealth: Payer: Self-pay | Admitting: Pulmonary Disease

## 2010-04-08 ENCOUNTER — Encounter: Payer: Self-pay | Admitting: Pulmonary Disease

## 2010-04-08 ENCOUNTER — Ambulatory Visit (INDEPENDENT_AMBULATORY_CARE_PROVIDER_SITE_OTHER)
Admission: RE | Admit: 2010-04-08 | Discharge: 2010-04-08 | Disposition: A | Discharge status: Home / Self Care | Payer: BC Managed Care – PPO | Source: Ambulatory Visit | Attending: Pulmonary Disease | Admitting: Pulmonary Disease

## 2010-04-08 ENCOUNTER — Ambulatory Visit (INDEPENDENT_AMBULATORY_CARE_PROVIDER_SITE_OTHER): Payer: BC Managed Care – PPO | Admitting: Pulmonary Disease

## 2010-04-08 ENCOUNTER — Telehealth: Payer: Self-pay | Admitting: Pulmonary Disease

## 2010-04-08 DIAGNOSIS — R079 Chest pain, unspecified: Secondary | ICD-10-CM

## 2010-04-08 DIAGNOSIS — R1310 Dysphagia, unspecified: Secondary | ICD-10-CM

## 2010-04-08 DIAGNOSIS — K219 Gastro-esophageal reflux disease without esophagitis: Secondary | ICD-10-CM

## 2010-04-08 DIAGNOSIS — R490 Dysphonia: Secondary | ICD-10-CM

## 2010-04-08 DIAGNOSIS — J189 Pneumonia, unspecified organism: Secondary | ICD-10-CM | POA: Insufficient documentation

## 2010-04-08 DIAGNOSIS — F17200 Nicotine dependence, unspecified, uncomplicated: Secondary | ICD-10-CM

## 2010-04-08 DIAGNOSIS — F449 Dissociative and conversion disorder, unspecified: Secondary | ICD-10-CM

## 2010-04-08 DIAGNOSIS — R002 Palpitations: Secondary | ICD-10-CM

## 2010-04-08 MED ORDER — IOHEXOL 300 MG/ML  SOLN
80.0000 mL | Freq: Once | INTRAMUSCULAR | Status: AC | PRN
  Administered 2010-04-08: 80 mL via INTRAVENOUS

## 2010-04-14 NOTE — Progress Notes (Signed)
Summary: would like results of xray and labs from yesterday  Phone Note Call from Patient Call back at Home Phone [phone] [facility]     Caller: Patient Call For: Kriste Basque Summary of Call: Patient phoned stated that she had a chest xray and labs yesterday and she wants the results. she can be reached at [phone] Initial call taken by: Vedia Coffer,  April 06, 2010 11:57 AM  Follow-up for Phone Call        Pt is requesting results of labs and xray from yesterday. Plase advsie. Carron Curie CMA  April 06, 2010 12:21 PM   Additional Follow-up for Phone Call Additional follow up Details #1::        called and spoke with pt per SN---results of labs  all WML except the thyroid is overactive---pt is on levothyroid 138mcg---pt stated that she is taking this med every day---cxr showed slight increase markings----pt is scheduled for ct scan on friday 3-2 at 10 and will come to see SN on friday at 3pm for review of scan.  pt voiced her understanding of this  Randell Loop [facility]  April 06, 2010 2:59 PM

## 2010-04-14 NOTE — Miscellaneous (Signed)
Summary: Orders Update  Clinical Lists Changes  Orders: Added new Test order of T-2 View CXR (71020TC) - Signed 

## 2010-04-14 NOTE — Progress Notes (Signed)
Summary: cough/ speak to dr Kriste Basque  Phone Note Call from Patient Call back at Home Phone [phone]   Caller: Patient Call For: Tasha [name] Summary of Call: pt has continued to cough for over a wk. meds that were called in on 21st have not helped- she coughs "continously". wants to speak to dr Maddyx Vallie. says she has recently learned that she has a mass on her chest- is going back this week for another test. pt uses [facility] on [address].  Initial call taken by: Tivis Ringer, CNA,  April 04, 2010 9:03 AM  Follow-up for Phone Call        patient called back stated that she is waiting on a call she has coughed all weekend and can hardly talk. She can be reached at [phone].Vedia Coffer  April 04, 2010 10:42 AM  LMOMTCB.Michel Bickers [facility]  April 04, 2010 11:32 AM  Additional Follow-up for Phone Call Additional follow up Details #1::        spoke with SN---he stated that he did speak with the MD from [facility] on friday and he stated that he was going to call pt to let her decide where she wants to have these done---i called and spoke with pt and she stated that the abx is not working for her---per SN--no other abx for now--finish what she is taking for now---pt will come in for cxr and labs in the am and we will set her up for high resolution ct scan---labs are  cbcd-285.9/bmp-401.9/tsh-244.9/hepat-790.5/sed rate-715.90/ace-135.  these are in epic for pt and cxr has been placed in the computer.Fuller Song is aware that someone will call her and let her know when the ct scan is  Randell Loop [facility]  April 04, 2010 5:40 PM

## 2010-04-14 NOTE — Progress Notes (Signed)
Summary: CT / fax request  Phone Note From Other Clinic   Caller: sheila w/ [facility] Call For: Symir Mah Summary of Call: wants a copy of pt's CT asap. fax to: [phone]. contact # is [phone] Initial call taken by: Tivis Ringer, CNA,  April 08, 2010 4:18 PM  Follow-up for Phone Call        copy of ct scan results have been faxed  ok per SN Randell Loop CMA  April 08, 2010 4:46 PM

## 2010-04-14 NOTE — Assessment & Plan Note (Signed)
Summary: follow up after ct scan at 10am/la   Primary Care Provider:  Alroy Dust, MD  CC:  4 month ROV & add-on for abnormal eval at [facility]....  History of Present Illness: 50 y/o WF here for a follow up visit... she has multiple medical problems as noted below...    ~  Jun 14, 2009:  she has been to the lipid clinic and tried on Lipitor but INTOL w/ leg pain & asking for my suggestion> we discussed the lowest dose pill avail= Livalo & she will try it...  she saw DrEllison for Endocrine w/ hypothyroidism> he incr her dose of Synthroid from 125 to 150 but she didn't like how she felt on this dose so she decr back to 136mcg/d on her own (the main prob over the yrs was medication compliance & hard to guess her dose when taking intermittently)...  DrGessner had referred her to [facility] for GI consult but she tells me she cancelled the appt when her symptoms resolved on the Dexilant Rx that we perscribed...  finally c/o reurrent HA's and visual symptoms c/w migraines>  prev eval DrReynolds & Rx w/ Maxalt...   ~  December 14, 2009:  she persists w/ mult somatic complaints that characterize her FM & assoc problems> fatigue, legs ache & she wants Mirapex refilled, she stopped the Livalo after 2 wks due to leg pains she says (she has been referred to [facility]id Clinic)... she saw DrReynolds 6/11 for f/u migraines & he advised her to take the Maxalt earlier in the course of a HA, & perscribed Nortriptyline 25mg  Qhs for prophylaxis... she wants all her pain meds refilled (see below).   ~  April 08, 2010:  She has hx chronic GI problems including GERD, Dysphagia, & IBS (which she notes is one of her chr disability diagnoses)... recently she has noted incr dysphagia & globus phenomenon ("lump in my throat & feeling like I'm swallowing golf balls") & she wanted referral to ENT- DrRosen (we don't have his consult note)... she was referred by DrGessner to [facility] & seen 03/07/10- his note is reviewed (pt indicates  she was told that her swallowing difficulty was related to her nerves)... they indicated that they were going to do CT Neck (due to the lump sensation), Ba Swallow, refer to ENT- DrBuckmire for eval hoarseness, & rx w/ antireflux regimen & continued PPI meds (CT report, MBS, & ENT notes ===> all pending) ... DrMadanick called to tell me that the CT neck showed mediastinal adenopathy & she needed dedicated CT Chest & pulm f/u here... [name] says DrBuckmire found 11-12 polyps on her cords & rec speech therapy.    She is a 1ppd smoker x30+yrs... last CXR 9/10 was clear, NAD... 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 in this regard she is certainly a set-up for aspiration> current CXR shows patchy right mid lung opac & CT Chest today revealed mild centrilob emphysema, focal airsp dis RUL, ?reactive adenopathy vs other etiology, & hepatic steatosis... **we discussed these findings & rec treatment for prob asp pneumonia w/ short term follow up & eventual repeat CT later...    Current Problem List:  Hx of TMJ PAIN (ICD-524.62) + **GLOBUS Phenomenon** believed related to  GERD...  ~  9/10: ENT eval by Darlin Priestly (referred by DrSMiller)- Globus likely due to reflux, continue PPI Bid, but she only takes intermittently...  CIGARETTE SMOKER (ICD-305.1) - she continues to smoke 1ppd... not interested in smoking cessation help.  ~  CXR 9/10 showed norm heart size, clear lungs, NAD...  2/12 ==> ** R/O RUL ASPIRATION PNEUMONIA**  ? of MITRAL VALVE PROLAPSE (ICD-424.0) - followed by Walker Kehr for Cards... she has intermittent CP (likely related to her FM), and palpitations that continue to be a problem for her- advised to quit nicotine & caffeine...   ~  2DEcho 10/06 was normal without  MVP seen, no MR, norm LVF... prev 2D in 1990 w/ mild bowing of ant leaflet c/w mild MVP at that time...  ~  NuclearStressTest 2/02 was WNL- no ischemia, no infarction, EF=73%...  ~  Cardiac eval 9/10 by Walker Kehr for atyp CP- NuclearStressTest was normal- EF=70%...  HYPERCHOLESTEROLEMIA (ICD-272.0) - hx of Chol values >300 in the past (esp when thyroid was underactive)... she has been intolerant to all statin meds and inconsistant w/ f/u labs... currently on diet + Zetia & Fenofibrate per [facility].  ~  FLP 7/09 showed TChol 301, TG 284, HDL 32, LDL 210... referred to [facility], tried on CRESTOR5 but she stopped due to leg pain.  ~  FLP 7/10 showed TChol 318, TG 318, HDL 32, LDL 228... given [id number], but stopped this on her own... rec> return to [facility].  ~  FLP 1/11 showed TChol 283, TG 271, HDL 32, LDL 212  ~  Lipid Clinic 1/11> she reports trial LIPITOR but intol w/ leg pain & never ret to clinic.  ~  5/11:  we discussed trial of the lowest dose statin avail> LIVALO 2mg  samples start 1/2 tab Qhs... she reports stopping this med after 2 wks c/o leg pains & she is not willing to continue statin Rx ==> rec f/u Lipid Clinic.  ~  2/12: she reports back on diet + Zetia/ Fenofibrate per [facility] staff; note: TChol in 2011= 283-310 range.  HYPOTHYROIDISM (ICD-244.9) - on SYNTHROID 144mcg/d... hypothyroidism initially diagnosed in the 1980's and she's been on meds ever since then... she has been notoriously non-compliant w/ her med rx making dosing difficult.  ~  labs 12/08 showed TSH= 31 ? off her meds... rec to restart her Synthroid regularly...  ~  labs 7/09 showed TSH= 2.24  ~  labs 3/10 showed TSH= 0.95  ~  labs 7/10 showed TSH= 4.22... suspect she doesn't take med daily.  ~  labs 1/11 showed TSH= 10.95... pt reminded to take Synthroid dose 14mcg/d regularly.  ~  3/11: pt saw DrEllison for consult & Synthroid incr to 128mcg/d but she "didn't feel right" on this & ?ret to 122mcg/d.  ~  2/12:  labs  showed TSH=29.4 & she states taking med daily but confirmed w/ pharm that she's been out for 2weeks!  GERD (ICD-530.81) & DYSPHAGIA UNSPECIFIED (ICD-787.20) - prev GI eval by DrSamLeBauer- now sees DrGessner & was advised to quit smoking & caffeine, +given Aciphex & Carafate but she stopped both meds and used Tums/ Rolaids/ Maalox etc... now she notes best response to [facility] 60mg  1-2 daily...  ~  EGD 10/10 by DrGessner showed severe gastritis, norm esoph, norm duodenum, neg HPylori.  ~  pt referred to [facility] by DrGessner but cancelled when all her symptoms improved on DEXILANT 60mg /d.  ~  1/12:  **pt requested referral to [facility] for her GERD, dysphagia, LER, Globus** SEE  ABOVE  IBS (ICD-564.1) - colonoscopy 3/93 by DrSam was WNL...symptoms were felt to be IBS...  ~  CTAbd 8/06 showed fatty liver, neg abd, s/p hyst, left ovarian cyst, norm right ovary...  ~  Colonoscopy 3/10 by drGessner showed 7mm hyperplastic polyp, hems, otherw neg... f/u planned 94yrs.  ~  5/10:  f/u DrGessner w/ IBS/ gas/ alt bowel habits- given ALIGN & BENTYL...  LIVER FUNCTION TESTS, ABNORMAL (ICD-794.8) - she had second GI opinion and eval of Abn LFT's by DrBuccini in 2002... he felt she had Fatty Liver Disease... she's been counselled to lose weight/ low fat diet/ incr exercise...  ~  labs 7/09 showed SGOT= 45, SGPT= 65  ~  labs 3/10 showed SGOT= 48, SGPT= 64  ~  labs 7/10 showed SGOT= 59, SGPT= 77  ~  labs 1/11 showed SGOT= 42, SGPT= 50  ~  labs 2/12 showed SGOT= 34, SGPT= 36; CT Chest showed hepatic steatosis.  FIBROMYALGIA (ICD-729.1) - currently on SKELAXIN 800mg Tid & MOBIC 15mg  as needed... over the years she has seen DrTruslow, DrZeminski, & DrDeveshwar for Rheum opinions... she has also been treated by DrWillen/chiropractor... she has tried Flexeril, DCN100, Vicodin, Mobic, Skelaxin...  ~  11/11:  she requests refill of Vicodin, Mobic, Mirapex...  HEADACHE, CHRONIC (ICD-784.0) - on VICODIN limited to 3/d no early  refills... she has been eval by Dr Meryl Crutch & DrWeymann for Neurology- they rec the Wellbutrin & Celexa... prev Neuro [place] 1[place] w/ DrHaworth for c/o numbness & tingling- (***of note he recorded that her ROS was pos for every question that he asked her***)... he rec MRI Brain, NCV's, and extensive lab work- we never recieved a follow up note after the initial visit... prev MRI from Neuro in [place] was WNL in the 1990's...  ~  9/10: eval by DrReynolds- he thought Migraines & Rx w/ MAXALT...  ~  6/11:  f/u Neuro w/ DrReynolds- continue Maxalt & add NORTRIPTYLINE 25mg  Qhs prophylaxis.  PERIODIC LIMB MOVEMENT DISORDER (ICD-327.51) - she had a prev sleep study 1/04 w/ signif periodic limb movements (leg jerks) w/ sleep disruption (study showed RDI= 1, but PLM index was 11- mostly arousals)... tried on MIRAPEX 0.25mg - 2 Qhs but she didn't continue> asked for refill Rx 11/11...  ANXIETY (ICD-300.00) - on ALPRAZOLAM 0.5mg Qid- "it's the only thing that helps". DEPRESSION (ICD-311) - on WELLBUTRIN XL 300mg /d,  & CELEXA 40mg /d.  ~  3/12:  she hasn't seen psychiatry in >10 yrs she says & has severe anxiety issues affecting her medical condition... rec referral back to DrMcKinney et al for their help in managing her problem.   Preventive Screening-Counseling & Management  Alcohol-Tobacco     Smoking Status: current     Smoking Cessation Counseling: yes     Smoke Cessation Stage: precontemplative     Packs/Day: 1.0  Caffeine-Diet-Exercise     Diet Counseling: to improve diet; diet is suboptimal     Does Patient Exercise: no  Allergies (verified): No Known Drug Allergies  Comments:  Nurse/Medical Assistant: The patient's medications and allergies were reviewed with the patient and were updated in the Medication and Allergy Lists.  Past History:  Past Medical History: GLOBUS (ICD-300.11) Hx of TMJ PAIN (ICD-524.62) CIGARETTE SMOKER (ICD-305.1) ? of MITRAL VALVE PROLAPSE (ICD-424.0) CHEST  PAIN (ICD-786.50) HYPERCHOLESTEROLEMIA (ICD-272.0) HYPOTHYROIDISM (ICD-244.9) OBESITY, MILD (ICD-278.02) GERD (ICD-530.81) DYSPHAGIA UNSPECIFIED (ICD-787.20) IBS (ICD-564.1) LIVER FUNCTION TESTS, ABNORMAL (ICD-794.8) FIBROMYALGIA (ICD-729.1) HEADACHE, CHRONIC (ICD-784.0) PERIODIC LIMB MOVEMENT DISORDER (ICD-327.51) ANXIETY (ICD-300.00) DEPRESSION (ICD-311)  Past Surgical History: S/P ELap w/ lysis of adhesions & excision of hydatid cyst left tube 1992 S/P supracervical hysterectomy  by DrFore in 2002 Tonsillectomy Arm Surgery-right Wrist surgery-right Foot surgery-Left  Family History: Reviewed history from 04/20/2009 and no changes required. mother alive age 18  hx of chol. and heart problems father alive age 13  hx of DM and heart problems 1 brother alive age 79 1 sister alive age 50 1 sister alive age 51 Family History of Colon Cancer: Paternal Aunt Family History of Breast Cancer: Maternal Aunt x 2 Family History of Stomach Cancer: Paternal Aunt, maternal grandmother Family History of Diabetes: Father, aunt x 2, uncles x 2 Family History of Heart Disease: Grandmother, Father, Aunt, Emelia Loron, Grandmother (paternal and maternal) Family History of Kidney Disease: cousin no thyroid dz  Social History: Reviewed history from 10/19/2008 and no changes required. disabled smoker -1 ppd married 2 children Alcohol Use - yes-occasionally Daily Caffeine Use-5 glasses a day Illicit Drug Use - no Patient does not get regular exercise.   Review of Systems       The patient complains of fatigue, malaise, sore throat, hoarseness, palpitations, dyspnea on exertion, cough, wheezing, vomiting, abdominal pain, gas/bloating, indigestion/heartburn, dysphagia, joint pain, muscle weakness, stiffness, paresthesias, depression, and anxiety.  The patient denies fever, chills, sweats, anorexia, weakness, weight loss, sleep disorder, blurring, diplopia, eye irritation, eye discharge, vision  loss, eye pain, photophobia, earache, ear discharge, tinnitus, decreased hearing, nasal congestion, nosebleeds, chest pain, syncope, orthopnea, PND, peripheral edema, dyspnea at rest, excessive sputum, hemoptysis, pleurisy, nausea, diarrhea, constipation, change in bowel habits, melena, hematochezia, jaundice, odynophagia, dysuria, hematuria, urinary frequency, urinary hesitancy, nocturia, incontinence, back pain, joint swelling, muscle cramps, arthritis, sciatica, restless legs, leg pain at night, leg pain with exertion, rash, itching, dryness, suspicious lesions, paralysis, seizures, tremors, vertigo, transient blindness, frequent falls, frequent headaches, difficulty walking, memory loss, confusion, cold intolerance, heat intolerance, polydipsia, polyphagia, polyuria, unusual weight change, abnormal bruising, bleeding, enlarged lymph nodes, urticaria, allergic rash, hay fever, and recurrent infections.    Vital Signs:  Patient profile:   50 year old female Height:      67 inches Weight:      184 pounds O2 Sat:      96 % on Room air Temp:     98.1 degrees F oral Pulse rate:   76 / minute BP sitting:   142 / 80  (right arm) Cuff size:   regular  Vitals Entered By: Randell Loop CMA (April 08, 2010 3:09 PM)  O2 Sat at Rest %:  96 O2 Flow:  Room air CC: 4 month ROV & add-on for abnormal eval at [facility]... Is Patient Diabetic? No Pain Assessment Patient in pain? no      Comments meds updated today with pt   Physical Exam  Additional Exam:  WD, Overweight, 50 y/o WF in NAD... GENERAL:  Alert, oriented & cooperative... HEENT:  Nikolai/AT, EOM-wnl, PERRLA, EACs-clear, TMs-wnl, NOSE-clear, THROAT- sl red & she is very hoarse... NECK:  Supple w/ fairROM; no JVD; normal carotid impulses w/o bruits; no thyromegaly or nodules palpated; no lymphadenopathy. CHEST:  Decr BS bilat w/ some rales & rhonchi over the right chest best heard anteriorly... HEART:  Regular Rhythm; without murmurs/ rubs/ or  gallops detected... ABDOMEN:  Soft & nontender; normal bowel sounds; no organomegaly or masses detected. EXT: without deformities or arthritic changes; no varicose veins/ venous insuffic/ or edema... she has mult diffuse trigger points... NEURO:  CN's intact; no focal  neuro deficits... DERM:  no lesions, no rash noted...    Impression & Recommendations:  Problem # 1:  R/O PNEUMONIA, ORGANISM UNSPECIFIED (ICD-486) The clinical presentation is compelling for an asp pneum w/ her dysphagia, globus, LER, VC polyps, etc & work up from GI- drGessner here 7 DrMadanick at [facility]; plus ENT- DrRosen here & DrBuckmire at [facility] >> we will try to get all the background data... REC> treat w/ Augmentin, Depo/ Pred taper, Mucinex, Tussionex, MMW, and ANTIREFLUX regimen (elev HOB, NPO after dinner, Dexilant, etc);  plan short term f/u w/ CXR & will need f/u CT later depending on the response;  I asked her to pursue voice therapy & all treatment recs from GI & ENT....  Her updated medication list for this problem includes:    Augmentin 875-125 Mg Tabs (Amoxicillin-pot clavulanate) .Marland Kitchen... Take 1 tab by mouth two times a day til gone...  Orders: Depo- Medrol 80mg  (J1040) Admin of Therapeutic Inj  intramuscular or subcutaneous (16109)  Problem # 2:  HOARSENESS (UEA-540.98) She tells me that drBuckmire's eval showed "11-12 polyps in my voicebox" & he rec voice therapy for her hoarseness which she notes is worse since her eval at [facility] asked to use MMW Qid & poceed w/ the voice therapy 7 f/u by ENT... we have called requesting their consult note.  Problem # 3:  GLOBUS (ICD-300.11) She is on Alpraz qid & asked to continue this regularly for now... we reviewed antireflux regimen for LER, Globus phenom etc...  Problem # 4:  GERD (ICD-530.81) She has GERD, LER, Globus, Dysphagia as noted by DrGessner, DrMadanick, etc... we reviewed antireflux requirements> elev HOB, NPO after dinner, continue Dexilant 1-2 per day,  etc... Her updated medication list for this problem includes:    Dexilant 60 Mg Cpdr (Dexlansoprazole) .Marland Kitchen... Take 1 cap by mouth once daily- 30 min before the 1st meal of the day...  Problem # 5:  CIGARETTE SMOKER (ICD-305.1) She hasn't smoked in the last week & she is encouraged to stay quit> offered nicotine replacement rx, Chantix, etc but she is not interested & will do it on her own...  Problem # 6:  HYPERCHOLESTEROLEMIA (ICD-272.0) she will continue f/u & management via the lipid clinic... Her updated medication list for this problem includes:    Zetia 10 Mg Tabs (Ezetimibe) .Marland Kitchen... Take 1 tablet by mouth daily    Fenofibrate 54 Mg Tabs (Fenofibrate) .Marland Kitchen... Take 1 tablet by mouth daily  Problem # 7:  HYPOTHYROIDISM (ICD-244.9) She is encouraged to take her meds every day 7 fill rx every month, etc... Her updated medication list for this problem includes:    Levothroid 150 Mcg Tabs (Levothyroxine sodium) .Marland Kitchen... Take 1 tablet by mouth once a day  Problem # 8:  ANXIETY (ICD-300.00) In as much as many of her specialists feel that anxiety/ nerves has alot to do w/ her GI problems- I've rec f/u w/ psychiatry for her nerves etc... Her updated medication list for this problem includes:    Wellbutrin Xl 300 Mg Tb24 (Bupropion hcl) .Marland Kitchen... Take 1 tablet by mouth once a day    Celexa 40 Mg Tabs (Citalopram hydrobromide) .Marland Kitchen... Take 1 tablet by mouth once a day    Alprazolam 0.5 Mg Tabs (Alprazolam) .Marland Kitchen... As needed four times a day  not to exceed 4 tabs per day and no early refills  Orders: Psychiatric Referral (Psych)  Complete Medication List: 1)  Zetia 10 Mg Tabs (Ezetimibe) .... Take 1 tablet by mouth daily 2)  Fenofibrate 54 Mg Tabs (Fenofibrate) .... Take 1 tablet by mouth daily 3)  Levothroid 150 Mcg Tabs (Levothyroxine sodium) .... Take 1 tablet by mouth once a day 4)  Dexilant 60 Mg Cpdr (Dexlansoprazole) .... Take 1 cap by mouth once daily- 30 min before the 1st meal of the day... 5)   Vicodin 5-500 Mg Tabs (Hydrocodone-acetaminophen) .Marland Kitchen.. 1 by mouth every 6-8 hours as needed  no early refiils  do not exceed 3  tabs per day 6)  Mobic 15 Mg Tabs (Meloxicam) .Marland Kitchen.. 1 by mouth once daily as needed joint pain 7)  Wellbutrin Xl 300 Mg Tb24 (Bupropion hcl) .... Take 1 tablet by mouth once a day 8)  Celexa 40 Mg Tabs (Citalopram hydrobromide) .... Take 1 tablet by mouth once a day 9)  Alprazolam 0.5 Mg Tabs (Alprazolam) .... As needed four times a day  not to exceed 4 tabs per day and no early refills 10)  Mirapex 0.25 Mg Tabs (Pramipexole dihydrochloride) .... Take 2 tabs by mouth qhs for legs... 11)  Augmentin 875-125 Mg Tabs (Amoxicillin-pot clavulanate) .... Take 1 tab by mouth two times a day til gone... 12)  Mucinex 600 Mg Xr12h-tab (Guaifenesin) .... Take 2 tabs by mouth two times a day w/ plenty of fluids... 13)  Tussionex Pennkinetic Er 10-8 Mg/60ml Lqcr (Hydrocod polst-chlorphen polst) .Marland Kitchen.. 1 tsp by mouth two times a day  for cough... 14)  Magic Mouthwash  .... 1 tsp gargle & swallow up to 4 times daily as directed... 15)  Prednisone 20 Mg Tabs (Prednisone) .... Take 1 tab by mouth two times a day x4d,  then 1 tab daily x4d,  then 1/2 tab daily til return...  Patient Instructions: 1)  Today we updated your med list- see below.... 2)  Continue your current meds as discussed... be sure you are taking your meds every day & filling your perscriptions every month!!! 3)  For the Cough & Infiltrate on CXR:  take the Augmentin for another week... start the Prednisone in a tapering schedule as directed... stay on the [facility]- 2 tabs twice daily w/ plenty of fluids.Marland KitchenMarland Kitchen 4)  For your hoarseness:  use the MMW 4 times daily... NO SMOKING... elevate the head of your bed on 6" blocks, & nothing to eat or drink within 3-4H of bedtime... continue the Dexilant etc... establish the VOICE THERAPY as directed by DrBuckmire... 5)  Call for any questions.Marland KitchenMarland Kitchen 6)  Let's plan a follow up appt in 3 weeks w/  f/u CXR at that time... Prescriptions: PREDNISONE 20 MG TABS (PREDNISONE) take 1 tab by mouth two times a day x4d,  then 1 tab daily x4d,  then 1/2 tab daily til return...  #20 x 0   Entered and Authorized by:   [name] MD   Signed by:   [name] MD on 04/08/2010   Method used:   Print then Give to Patient   RxID:   [id number] MAGIC MOUTHWASH 1 tsp gargle & swallow up to 4 times daily as directed...  #6 oz x 6   Entered and Authorized by:   [name] MD   Signed by:   [name] MD on 04/08/2010   Method used:   Print then Give to Patient   RxID:   [id number] TUSSIONEX PENNKINETIC ER 10-8 MG/5ML LQCR (HYDROCOD POLST-CHLORPHEN POLST) 1 tsp by mouth two times a day  for cough...  #6 oz x 6   Entered and Authorized by:  [name] MD   Signed by:   [name] MD on 04/08/2010   Method used:   Print then Give to Patient   RxID:   [id number] AUGMENTIN 875-125 MG TABS (AMOXICILLIN-POT CLAVULANATE) take 1 tab by mouth two times a day til gone...  #14 x 0   Entered and Authorized by:   [name] MD   Signed by:   [name] MD on 04/08/2010   Method used:   Print then Give to Patient   RxID:   [id number]      Medication Administration  Injection # 1:    Medication: Depo- Medrol 80mg     Diagnosis: PNEUMONIA, ORGANISM UNSPECIFIED (ICD-486)    Route: IM    Site: LUOQ gluteus    Exp Date: 08/2012    Lot #: [id number]    Mfr: [employer]    Patient tolerated injection without complications    Given by: Randell Loop CMA (April 08, 2010 4:38 PM)  Orders Added: 1)  Est. Patient Level V [84696] 2)  Depo- Medrol 80mg  [J1040] 3)  Admin of Therapeutic Inj  intramuscular or subcutaneous [96372] 4)  Psychiatric Referral [Psych]

## 2010-04-22 ENCOUNTER — Telehealth: Payer: Self-pay | Admitting: Pulmonary Disease

## 2010-04-25 ENCOUNTER — Encounter: Payer: Self-pay | Admitting: Pulmonary Disease

## 2010-04-26 ENCOUNTER — Telehealth: Payer: Self-pay | Admitting: Pulmonary Disease

## 2010-04-26 DIAGNOSIS — J189 Pneumonia, unspecified organism: Secondary | ICD-10-CM

## 2010-04-26 NOTE — Progress Notes (Signed)
Summary: cough, raspy voice, sweats  Phone Note Call from Patient Call back at Home Phone [phone]   Caller: Patient Call For: Taralynn Quiett Reason for Call: Talk to Nurse Summary of Call: Patient states she has pneumonia and is requesting to speak to nurse about it.  No further info. given. Initial call taken by: Lehman Prom,  April 22, 2010 10:07 AM  Follow-up for Phone Call        Called and spoke with pt and she c/o raspy voice, cough w/ clear mucus, wheezing, chest tightness, severe sweats, some nausea x 1 month and is not getting any better, Pt wants to know if back mold can cause ehr to have these symptoms. Pt states she just wants a call back letting her know what is going on with her.  Dr. Kriste Basque please advise. thanks.   Allergies  No Known Drug Allergie  Carver Fila  April 22, 2010 10:27 AM   Additional Follow-up for Phone Call Additional follow up Details #1::        per SN--keep scheduled appt with SN---raspy voice, clear mucus, and wheezing, nausea---all can cause severe reflux to her throat---and she should follow up with ENT   Dr. Pollyann Kennedy here or ENT at [facility] [place]----SN stated that he has never heard of black mold causing this before.  thanks Randell Loop CMA  April 22, 2010 3:27 PM   Called and informed pt of sn recs. Pt now complaining of having a terrible headache all day and wants recommendations Dr. Kriste Basque Please advise thanks Carver Fila  April 22, 2010 3:56 PM     Additional Follow-up for Phone Call Additional follow up Details #2::    If these are not ordinary headaches, then she will need ov----but pt has vicodin that she can use for the headaches.  thanks Randell Loop CMA  April 22, 2010 3:58 PM   Spoke with pt and advised to try vicodin for headache. Carron Curie CMA  April 22, 2010 4:03 PM

## 2010-04-26 NOTE — Telephone Encounter (Signed)
LMOMTCB. - Dr Jodelle Green ov note from 04-08-10 states that he wants pt to have a repeat cxr prior to next ov not CT.

## 2010-04-27 NOTE — Telephone Encounter (Signed)
Spoke with patient  and she states SN told her she would need a repeat CT before her next ov but last note states pt needs a CXR. Pt states she was sure it was a CT that was needed. Please advise. Carron Curie, MA

## 2010-04-27 NOTE — Telephone Encounter (Signed)
Per last ov note   SN  Wants pt  to have cxr prior to ov--this order has been put in the computer for repeat cxr for f/u pna.  Left message for pt to call me back to make her aware.

## 2010-04-28 ENCOUNTER — Ambulatory Visit (INDEPENDENT_AMBULATORY_CARE_PROVIDER_SITE_OTHER): Payer: BC Managed Care – PPO | Admitting: Pulmonary Disease

## 2010-04-28 ENCOUNTER — Encounter: Payer: Self-pay | Admitting: Pulmonary Disease

## 2010-04-28 ENCOUNTER — Ambulatory Visit (INDEPENDENT_AMBULATORY_CARE_PROVIDER_SITE_OTHER)
Admission: RE | Admit: 2010-04-28 | Discharge: 2010-04-28 | Disposition: A | Discharge status: Home / Self Care | Payer: BC Managed Care – PPO | Source: Ambulatory Visit | Attending: Pulmonary Disease | Admitting: Pulmonary Disease

## 2010-04-28 VITALS — BP 124/72 | HR 62 | Temp 97.6°F | Ht 67.0 in | Wt 180.6 lb

## 2010-04-28 DIAGNOSIS — J189 Pneumonia, unspecified organism: Secondary | ICD-10-CM

## 2010-04-28 DIAGNOSIS — M26629 Arthralgia of temporomandibular joint, unspecified side: Secondary | ICD-10-CM

## 2010-04-28 DIAGNOSIS — IMO0001 Reserved for inherently not codable concepts without codable children: Secondary | ICD-10-CM

## 2010-04-28 DIAGNOSIS — K219 Gastro-esophageal reflux disease without esophagitis: Secondary | ICD-10-CM

## 2010-04-28 DIAGNOSIS — F411 Generalized anxiety disorder: Secondary | ICD-10-CM

## 2010-04-28 DIAGNOSIS — R490 Dysphonia: Secondary | ICD-10-CM

## 2010-04-28 MED ORDER — DEXLANSOPRAZOLE 60 MG PO CPDR: 60.0000 mg | DELAYED_RELEASE_CAPSULE | Freq: Every day | ORAL | Status: DC

## 2010-04-28 MED ORDER — ZOLPIDEM TARTRATE 10 MG PO TABS: 10.0000 mg | ORAL_TABLET | Freq: Every evening | ORAL | Status: AC | PRN

## 2010-04-28 NOTE — Progress Notes (Signed)
Subjective:    Patient ID: Tasha [name], female    DOB: 1960-05-18, 50 y.o.   MRN: [id number]  HPI 50 y/o WF here for a follow up visit... she has multiple medical problems including Asp Pneumonia from LER;  Cigarette smoker;  ?MVP w/ CP & Palpit;  Severe Hypercholesterolemia;  Hypothyroidism;  GERD & Dysphagia w/ LER & asp pneum;  IBS/ Colon polyps;  Abn LFTs from hepatic steatosis;  Severe FM;  Chr headaches;  RLS;  Anxiety & Depression...   ~  April 08, 2010:  She has hx chronic GI problems including GERD, Dysphagia, & IBS (which she notes is one of her chr disability diagnoses)... recently she has noted incr dysphagia & globus phenomenon ("lump in my throat & feeling like I'm swallowing golf balls") & she wanted referral to ENT- DrRosen (we don't have his consult note)... she was referred by DrGessner to [facility] & seen 03/07/10- his note is reviewed (pt indicates she was told that her swallowing difficulty was related to her nerves)... they indicated that they were going to do CT Neck (due to the lump sensation), Ba Swallow, refer to ENT- DrBuckmire for eval hoarseness, & rx w/ antireflux regimen & continued PPI meds (CT report, MBS, & ENT notes ===> all pending) ... DrMadanick called to tell me that the CT neck showed mediastinal adenopathy & she needed dedicated CT Chest & pulm f/u here... [name] says DrBuckmire found 11-12 polyps on her cords & rec speech therapy.    She is a 1ppd smoker x30+yrs... last CXR 9/10 was clear, NAD... 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 in this regard she is certainly a set-up for aspiration> current CXR shows patchy right mid lung opac & CT Chest today revealed mild centrilob emphysema, focal airsp dis RUL, ?reactive  adenopathy vs other etiology, & hepatic steatosis... **we discussed these findings & rec treatment for prob asp pneumonia w/ short term follow up & eventual repeat CT later...  ~  April 28, 2010:  She is s/p Augmentin & Pred taper w/ Mucinex/ Tussionex/ etc;  Clinically improved w/ decr cough/ phlegm/ dyspnea/ etc;  CXR today is similarly improved w/ resolution of the right sided pneumonia;  She still has mult somatic complaints w/ cough, beige sput, no hemoptysis, +dyspnea, +left post CWP, etc;  We reviewed her critically important antireflux regimen w/ elev HOB 6", NPO after dinner, continue Dexilant bid, etc;  She has cut back smoking to <1/2 ppd & she is asking for Chantix (Rx written);  She is going to proceed w/ the speech path training at [facility] & f/u w/ ENT DrBuckmire regarding her VC polyps;  We will recheck pt w/ CXR in 6weeks...   Review of Systems  Constitutional: Positive for fatigue. Negative for fever, chills and unexpected weight change.  HENT: Positive for sore throat, trouble swallowing and voice change. Negative for ear pain, congestion, rhinorrhea, sneezing and postnasal drip.   Respiratory: Positive for cough, choking and shortness of breath. Negative for chest tightness and wheezing.   Cardiovascular: Positive for chest pain and palpitations. Negative for leg swelling.  Gastrointestinal: Negative for nausea, vomiting, abdominal pain, diarrhea and constipation.  Genitourinary: Negative for dysuria, urgency, frequency, hematuria and flank pain.  Musculoskeletal: Positive for myalgias and arthralgias.  Skin: Negative for rash.  Neurological: Positive for weakness and headaches. Negative for dizziness, tremors, seizures, speech difficulty and light-headedness.  Hematological: Negative for adenopathy. Does not bruise/bleed easily.  Psychiatric/Behavioral: Positive for sleep disturbance. Negative for hallucinations and confusion. The patient is nervous/anxious.       Objective:    Physical Exam  [vitalsreviewed. Constitutional: She is oriented to person, place, and time. She appears well-developed and well-nourished.  HENT:  Head: Normocephalic and atraumatic.  Right Ear: External ear normal.  Left Ear: External ear normal.  Mouth/Throat: Oropharynx is clear and moist. No oropharyngeal exudate.  Eyes: Conjunctivae and EOM are normal. Pupils are equal, round, and reactive to light.  Neck: Neck supple. No JVD present. No thyromegaly present.  Cardiovascular: Normal rate, regular rhythm, normal heart sounds and intact distal pulses.   Pulmonary/Chest: Effort normal and breath sounds normal. She has no wheezes. She has no rales. She exhibits tenderness.  Abdominal: Soft. Bowel sounds are normal. She exhibits no distension. There is no tenderness.  Musculoskeletal: Normal range of motion. She exhibits tenderness. She exhibits no edema.  Lymphadenopathy:    She has no cervical adenopathy.  Neurological: She is alert and oriented to person, place, and time. She has normal reflexes. No cranial nerve deficit.  Skin: Skin is warm and dry. No rash noted.  Psychiatric:       She is anxious...         Assessment & Plan:

## 2010-04-28 NOTE — Assessment & Plan Note (Signed)
Tasha [name] remains quite anxious despite the alprazolam 0.5mg  Qid. Offered her to see Psychiatry but she states that DrMcKinney doesn't take her insurance. She is content to continue as is & we will fill the Xanax as needed. She is requesting AMBIEN 10mg  for sleep>  OK.

## 2010-04-28 NOTE — Assessment & Plan Note (Signed)
This is improved by my review w/ decr in the reflux/ LER etc... She reports that she is to start the speech theapy from [facility] DrBuckmire soon. Encouraged to continue meds & quit smoking.

## 2010-04-28 NOTE — Assessment & Plan Note (Signed)
She has severe FM symptoms w/ fatigue & pain, not resting well, etc... She has seen numerous rheumatologists & I have offered to return her to her rheum of chioce, but she declines. For now continue rest, heat, pain meds & muscle relaxers.Marland KitchenMarland Kitchen

## 2010-04-28 NOTE — Assessment & Plan Note (Signed)
We reviewed again the importance of a vigorous antireflux regimen w/ elev HOB 6", NPO after dinner, & the Dexilant Bid.

## 2010-04-28 NOTE — Patient Instructions (Signed)
We updated your med list today... Finish the current PREDNISONE at 1/2 tab daily til gone, then change to the new 5mg  Pred & take one tab daily x 2 weeks then 1 tab every other day til gone... We wrote new prescriptions for CHANTIX to help w/ smoking cessation... We refilled your DEXILANT to take twice daily as discussed... Finally we wrote a prescription for AMBIEN to take at bedtime for sleep...  Remember to continue the VIGOROUS ANTIREFLUX REGIMEN: Elevate the head of bed on 6" blocks Nothing to eat or drink much after dinner in the evening  Let's plan a follow up visit in 6 weeks w/ another CXR at that time

## 2010-04-28 NOTE — Assessment & Plan Note (Signed)
She is much improved after Rx w/ Augmentin for asp pneumonia + Pred for the inflamm etc... We decided to wean the Pred slowly (down to 5mg /d, the Qod til gone)... CXR> resolved right infiltrate... REC> f/u OV & CXR in 6weeks.

## 2010-05-05 NOTE — Telephone Encounter (Signed)
Pt had CXR on 04-28-10. Carron Curie, CMA

## 2010-05-10 ENCOUNTER — Encounter: Payer: Self-pay | Admitting: Pulmonary Disease

## 2010-05-11 ENCOUNTER — Telehealth: Payer: Self-pay | Admitting: Pulmonary Disease

## 2010-05-11 NOTE — Telephone Encounter (Signed)
Pt states she cannot find her bottle of Vicodin anywhere. She believes they either fell out of her car of someone took them from her car. Dr. Kriste Basque, please advise on sending new RX for the Vicodin.

## 2010-05-11 NOTE — Telephone Encounter (Signed)
lmomtcb x1 

## 2010-05-11 NOTE — Telephone Encounter (Signed)
Called the pharmacy and they stated that pt just filled her rx for the vicodin on 3-28--- per SN--anyone can have a period of  Bad luck---so we will refill this rx x 1 time only  ---the pharmacy stated that pt only has one further  Refill and he has put a note on this that this med cannot be filled for 1 month---no early refills at all of this med.  Called and spoke with pt and she is aware that her insurance will not cover this med again so she will have to pay out of pocket for this rx.  Pt is aware

## 2010-05-31 ENCOUNTER — Telehealth: Payer: Self-pay | Admitting: Pulmonary Disease

## 2010-05-31 MED ORDER — AZITHROMYCIN 250 MG PO TABS: ORAL_TABLET | ORAL | Status: AC

## 2010-05-31 MED ORDER — TRAMADOL HCL 50 MG PO TABS: 50.0000 mg | ORAL_TABLET | Freq: Three times a day (TID) | ORAL | Status: DC | PRN

## 2010-05-31 NOTE — Telephone Encounter (Signed)
Spoke w/ pt and advised her of SN recs. Pt states she already has tussionex syrup and wants to know if she can have the tablets instead. Please advise Dr. Kriste Basque. Thanks  Carver Fila, CMA

## 2010-05-31 NOTE — Telephone Encounter (Signed)
Per SN--yes she can have tramadol 50mg    #50   1 po tid as needed. thanks

## 2010-05-31 NOTE — Telephone Encounter (Signed)
Called and spoke with pt and she stated that she is having coughing -non productive x 3 days--but when she coughs she stated that water is coming out of her mouth?  Denies any fever but stated that she breaks out  In a sweat all the time, no matter if she is sleeping or resting she stated that she is soaking wet all the time.  Please advise. thanks

## 2010-05-31 NOTE — Telephone Encounter (Signed)
Pt aware of Tramadol rx and will call if having any problems or questions.

## 2010-05-31 NOTE — Telephone Encounter (Signed)
Per SN--zpak #1  Take as directed and for the cough she can have tussionex  #4oz  1 tsp po bid . thanks

## 2010-06-01 ENCOUNTER — Telehealth: Payer: Self-pay | Admitting: Pulmonary Disease

## 2010-06-01 ENCOUNTER — Emergency Department (HOSPITAL_COMMUNITY)
Admission: EM | Admit: 2010-06-01 | Discharge: 2010-06-01 | Disposition: A | Discharge status: Home / Self Care | Payer: BC Managed Care – PPO | Attending: Emergency Medicine | Admitting: Emergency Medicine

## 2010-06-01 ENCOUNTER — Emergency Department (HOSPITAL_COMMUNITY): Payer: BC Managed Care – PPO

## 2010-06-01 DIAGNOSIS — R059 Cough, unspecified: Secondary | ICD-10-CM | POA: Insufficient documentation

## 2010-06-01 DIAGNOSIS — R05 Cough: Secondary | ICD-10-CM | POA: Insufficient documentation

## 2010-06-01 DIAGNOSIS — F329 Major depressive disorder, single episode, unspecified: Secondary | ICD-10-CM | POA: Insufficient documentation

## 2010-06-01 DIAGNOSIS — E039 Hypothyroidism, unspecified: Secondary | ICD-10-CM | POA: Insufficient documentation

## 2010-06-01 DIAGNOSIS — IMO0001 Reserved for inherently not codable concepts without codable children: Secondary | ICD-10-CM | POA: Insufficient documentation

## 2010-06-01 DIAGNOSIS — R42 Dizziness and giddiness: Secondary | ICD-10-CM | POA: Insufficient documentation

## 2010-06-01 DIAGNOSIS — F3289 Other specified depressive episodes: Secondary | ICD-10-CM | POA: Insufficient documentation

## 2010-06-01 NOTE — Telephone Encounter (Signed)
Pt states that this morning she had bloody nasal discharge and also a small clot of blood in her mouth. She says her cough is non-prod and her chest just doesn't feel right. She says she doesn't really feel congested and can't explain how she feels other than she is weak and her chest feels weird. Pt says she has only seen the blood once and denies any sinus pain/pressure, no sob, wheezing or fever. Pls advise.No Known Allergies

## 2010-06-01 NOTE — Telephone Encounter (Signed)
lmomtcb x1 

## 2010-06-01 NOTE — Telephone Encounter (Signed)
Saline nasal rinses As needed   Mucinex DM Twice daily  As needed   Increase fluids  Please contact office for sooner follow up if symptoms do not improve or worsen or seek emergency care

## 2010-06-01 NOTE — Telephone Encounter (Signed)
Advised pt of TP recs. Pt verbalized understanding and had no further questions

## 2010-06-08 ENCOUNTER — Encounter: Payer: Self-pay | Admitting: Pulmonary Disease

## 2010-06-10 ENCOUNTER — Ambulatory Visit (INDEPENDENT_AMBULATORY_CARE_PROVIDER_SITE_OTHER): Payer: BC Managed Care – PPO | Admitting: Pulmonary Disease

## 2010-06-10 ENCOUNTER — Other Ambulatory Visit: Payer: Self-pay | Admitting: Pulmonary Disease

## 2010-06-10 ENCOUNTER — Encounter: Payer: Self-pay | Admitting: Pulmonary Disease

## 2010-06-10 DIAGNOSIS — F17200 Nicotine dependence, unspecified, uncomplicated: Secondary | ICD-10-CM

## 2010-06-10 DIAGNOSIS — IMO0001 Reserved for inherently not codable concepts without codable children: Secondary | ICD-10-CM

## 2010-06-10 DIAGNOSIS — R591 Generalized enlarged lymph nodes: Secondary | ICD-10-CM

## 2010-06-10 DIAGNOSIS — F329 Major depressive disorder, single episode, unspecified: Secondary | ICD-10-CM

## 2010-06-10 DIAGNOSIS — R599 Enlarged lymph nodes, unspecified: Secondary | ICD-10-CM

## 2010-06-10 DIAGNOSIS — E663 Overweight: Secondary | ICD-10-CM

## 2010-06-10 DIAGNOSIS — R945 Abnormal results of liver function studies: Secondary | ICD-10-CM

## 2010-06-10 DIAGNOSIS — R1310 Dysphagia, unspecified: Secondary | ICD-10-CM

## 2010-06-10 DIAGNOSIS — E785 Hyperlipidemia, unspecified: Secondary | ICD-10-CM

## 2010-06-10 DIAGNOSIS — K219 Gastro-esophageal reflux disease without esophagitis: Secondary | ICD-10-CM

## 2010-06-10 DIAGNOSIS — E039 Hypothyroidism, unspecified: Secondary | ICD-10-CM

## 2010-06-10 DIAGNOSIS — F411 Generalized anxiety disorder: Secondary | ICD-10-CM

## 2010-06-10 DIAGNOSIS — K589 Irritable bowel syndrome without diarrhea: Secondary | ICD-10-CM

## 2010-06-10 DIAGNOSIS — F3289 Other specified depressive episodes: Secondary | ICD-10-CM

## 2010-06-10 MED ORDER — DEXLANSOPRAZOLE 60 MG PO CPDR: 60.0000 mg | DELAYED_RELEASE_CAPSULE | Freq: Two times a day (BID) | ORAL | Status: DC

## 2010-06-10 NOTE — Progress Notes (Signed)
Subjective:    Patient ID: Tasha [name], female    DOB: [birth year], 50 y.o.   MRN: [id number]  HPI 50 y/o WF here for a follow up visit... she has multiple medical problems including Asp Pneumonia from LER;  Cigarette smoker;  ?MVP w/ CP & Palpit;  Severe Hypercholesterolemia;  Hypothyroidism;  GERD & Dysphagia w/ LER & asp pneum;  IBS/ Colon polyps;  Abn LFTs from hepatic steatosis;  Severe FM;  Chr headaches;  RLS;  Anxiety & Depression...   ~  April 08, 2010:  She has hx chronic GI problems including GERD, Dysphagia, & IBS (which she notes is one of her chr disability diagnoses)... recently she has noted incr dysphagia & globus phenomenon ("lump in my throat & feeling like I'm swallowing golf balls") & she wanted referral to ENT- DrRosen (we don't have his consult note)... she was referred by DrGessner to [facility] & seen 03/07/10- his note is reviewed (pt indicates she was told that her swallowing difficulty was related to her nerves)... they indicated that they were going to do CT Neck (due to the globus sensation), Ba Swallow, refer to ENT- DrBuckmire for eval hoarseness, & rx w/ antireflux regimen & continued PPI meds (CT report, MBS, & ENT notes ===> all pending) ... DrMadanick called to tell me that the CT neck showed mediastinal adenopathy & she needed dedicated CT Chest & pulm f/u here... [name] says DrBuckmire found 11-12 polyps on her cords (we do not have any of his notes) & rec speech therapy.    She is a 1ppd smoker x30+yrs... last CXR 9/10 was clear, NAD... 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 in this regard she is certainly a set-up for aspiration> current CXR shows patchy right mid lung opac & CT Chest today revealed mild centrilob emphysema,  focal airsp dis RUL, ?reactive adenopathy vs other etiology, & hepatic steatosis >> we discussed these findings & rec treatment for prob asp pneumonia w/ short term follow up & eventual repeat CT later...  ~  April 28, 2010:  She is s/p Augmentin & Pred taper w/ Mucinex/ Tussionex/ etc;  Clinically improved w/ decr cough/ phlegm/ dyspnea/ etc;  CXR today is similarly improved w/ resolution of the right sided pneumonia;  She still has mult somatic complaints w/ cough, beige sput, no hemoptysis, +dyspnea, +left post CWP, etc;  We reviewed her critically important antireflux regimen w/ elev HOB 6", NPO after dinner, continue Dexilant bid, etc;  She has cut back smoking to <1/2 ppd & she is asking for Chantix (Rx written);  She is going to proceed w/ the speech path training at [facility] & f/u w/ ENT DrBuckmire regarding her VC polyps;  We will recheck pt w/ CXR in 6weeks...  ~  Jun 10, 2010:   6 week ROV & she reports about the same> mult somatic complaints, very anxious about the prev CT showing mild centilob emphysema (the main findings were asp pneumonitis & reactive adenopathy);  She tells me she filled the Chantix Rx but hasn't taken it (scared of the side effects), still smoking but <1/2ppd now;  Prev CXR 04/28/10 showed resolution of the infiltrates, both lungs were clear, & she was reassured...  In the interval  she reports accompanying a friend w/ abd pain to the ER & mentioned to the staff that she had a cough & noted a sm amt of blood tinged mucus (it was actually a bloody nasal discharge & sm amt of blood in mouth); they did another CXR dated 06/01/10 & this too was clear & WNL; but the ER eval was very stressful...  The biggest concern at present is the continued globus phenomenon, difficulty swallowing & occas nocturnal regurg into her throat (obviously the source of her prev aspiration episode)> she notes that this occurs once or twice weekly despite her maximum antireflux regimen etc...  We reviewed the  antireflux recommendations; she requests increase Dexilant to Bid- OK; she requests follow up appt w/ DrMadanick at [facility] & we will set this up for her; finally she wants f/u CT Chest since being told she has emphysema scared her, & we want it to follow up on the prev noted adenopathy...         Problem List:  Hx of TMJ PAIN (ICD-524.62)> GLOBUS Phenomenon believed related to GERD> OROPHARYNGEAL DYSPHAGIA> ~  9/10: ENT eval by Darlin Priestly (referred by DrSMiller)- Globus likely due to reflux, continue PPI Bid, but she only takes intermittently... ~  1/12:  Pt requested ENT second opinion w/ DrRosen- referred> we don't have copies of his eval... ~  1/12 & 2/12:  Eval at [facility] by GI-DrMadanick & ENT-DrBuckmire> pt reports they found polyps on her vocal cords.  CIGARETTE SMOKER (ICD-305.1)> she continues to smoke cigarettes daily... ASPIRATION PNEUMONIA> this occurred 2/12 & was referred to [place] for f/u from [facility]... Mild Centrilobular Emphysema> noted on CT Chest 3/12... ~  CXR 9/10 showed norm heart size, clear lungs, NAD.Marland Kitchen. ~  CXR 2/12 showed patchy opacity right mid lung/ RML... ~  CT Chest 3/12 showed RUL airsp dis & thoracic adenopathy ?reactive, mild centilob emphysema, hep steatosis.Marland Kitchen. ~  f/u CXR 3/12 & 4/12> resolution of the right sided infiltrate, clear, WNL.Marland Kitchen. ~  f/u CT Chest 5/12 ==> pending  ? of MITRAL VALVE PROLAPSE (ICD-424.0) - followed by Walker Kehr for Cards... she has intermittent CP (likely related to her FM), and palpitations that continue to be a problem for her- advised to quit nicotine & caffeine...  ~  2DEcho 10/06 was normal without MVP seen, no MR, norm LVF... prev 2D in 1990 w/ mild bowing of ant leaflet c/w mild MVP at that time... ~  NuclearStressTest 2/02 was WNL- no ischemia, no infarction, EF=73%... ~  Cardiac eval 9/10 by Walker Kehr for atyp CP- NuclearStressTest was normal- EF=70%...  HYPERCHOLESTEROLEMIA (ICD-272.0) - hx of Chol values >300 in the past (esp when  thyroid was underactive)... she has been intolerant to all statin meds and has been managed by the Lipid Clinic> currently on diet + Zetia & Fenofibrate... ~  FLP 7/09 showed TChol 301, TG 284, HDL 32, LDL 210... referred to LipidClinic, tried on CRESTOR5 but she stopped due to leg pain. ~  FLP 7/10 showed TChol 318, TG 318, HDL 32, LDL 228... given [id number], but stopped this on her own... rec> return to LipidClinic. ~  FLP 1/11 showed TChol 283, TG 271, HDL 32, LDL 212... she reports trial LIPITOR but intol w/ leg pain... ~  5/11:  we discussed trial of LIVALO 2mg  samples start 1/2 tab Qhs... she reports stopping this med after 2 wks c/o leg pains & she is not willing to continue statin Rx ==> rec return to Lipid Clinic. ~  2/12: she  reports back on diet + Zetia/ Fenofibrate per LC staff (note: TChol in 2011= 283-310 range).  HYPOTHYROIDISM (ICD-244.9) - on SYNTHROID 147mcg/d... hypothyroidism initially diagnosed in the 1980's and she's been on meds ever since then... ~  labs 12/08 showed TSH= 31 ?off her meds... rec to restart her Synthroid regularly... ~  labs 7/09 showed TSH= 2.24 ~  labs 3/10 showed TSH= 0.95 ~  labs 7/10 showed TSH= 4.22 ~  labs 1/11 showed TSH= 10.95... pt reminded to take Synthroid dose 155mcg/d regularly. ~  3/11: pt saw DrEllison for consult & Synthroid incr to 120mcg/d but she "didn't feel right" on this & ?ret to 157mcg/d. ~  2/12:  labs showed TSH=29.4 & she states taking med daily but confirmed w/ pharm that she's been out for 2weeks!  GERD (ICD-530.81) & DYSPHAGIA UNSPECIFIED (ICD-787.20) - prev GI eval by DrSamLeBauer- now sees DrGessner & was advised to quit smoking & caffeine, +given Aciphex & Carafate but she stopped both meds and used Tums/ Rolaids/ Maalox etc... now she notes best response to [facility] 60mg  1-2 daily... ~  EGD 10/10 by DrGessner showed severe gastritis, norm esoph, norm duodenum, neg HPylori. ~  pt referred to [facility] by DrGessner but cancelled  when all her symptoms improved on DEXILANT 60mg /d. ~  1/12:  pt requested referral to [facility] for her GERD, dysphagia, LER, Globus> seen by [facility] & his note is reviewed... ~  5/12:  Pt notes continued globus sensation making swallowing difficult & nocturnal regurgitation into her throat occuring 1-2 per week; referred back to [facility]  IBS (ICD-564.1) - colonoscopy 3/93 by DrSam was WNL...symptoms were felt to be IBS... ~  CTAbd 8/06 showed fatty liver, neg abd, s/p hyst, left ovarian cyst, norm right ovary... ~  Colonoscopy 3/10 by drGessner showed 7mm hyperplastic polyp, hems, otherw neg... f/u planned 61yrs. ~  5/10:  f/u DrGessner w/ IBS/ gas/ alt bowel habits- given ALIGN & BENTYL...  LIVER FUNCTION TESTS, ABNORMAL (ICD-794.8) - she had second GI opinion and eval of AbnLFT's by DrBuccini in 2002... he felt she had Fatty Liver Disease... she's been counselled to lose weight/ low fat diet/ incr exercise... ~  labs 7/09 showed SGOT= 45, SGPT= 65 ~  labs 3/10 showed SGOT= 48, SGPT= 64 ~  labs 7/10 showed SGOT= 59, SGPT= 77 ~  labs 1/11 showed SGOT= 42, SGPT= 50 ~  labs 2/12 showed SGOT= 34, SGPT= 36; CT Chest showed hepatic steatosis...  FIBROMYALGIA (ICD-729.1) - on MOBIC 15mg  as needed... over the years she has seen DrTruslow, DrZeminski, & DrDeveshwar for Rheum opinions... she has also been treated by DrWillen/chiropractor... she has tried Flexeril, DCN100, Vicodin, Mobic, Skelaxin... ~  11/11:  she requests refill of Vicodin, Mobic, Mirapex...  HEADACHE, CHRONIC (ICD-784.0) - on VICODIN limited to 3/d no early refills... she has been eval by Dr Meryl Crutch & DrWeymann for Neurology- they rec the Wellbutrin & Celexa... prev [place] in [place] 1[place] w/ DrHaworth for c/o numbness & tingling- (of note he recorded that her ROS was pos for every question that he asked her)... he rec MRI Brain, NCV's, and extensive lab work- we never received a follow up note after the initial visit... prev MRI from  Neuro in [place] was WNL in the 1990's... ~  9/10: eval by DrReynolds- he thought Migraines & Rx w/ MAXALT... ~  6/11:  f/u Neuro w/ DrReynolds- continue Maxalt & add NORTRIPTYLINE 25mg  Qhs prophylaxis.  PERIODIC LIMB MOVEMENT DISORDER (ICD-327.51) - she had a prev  sleep study 1/04 w/ signif periodic limb movements (leg jerks) w/ sleep disruption (study showed RDI= 1, but PLM index was 11- mostly arousals)... tried on MIRAPEX 0.25mg - 2 Qhs but she didn't continue> asked for refill Rx 11/11...  ANXIETY (ICD-300.00) - on ALPRAZOLAM 0.5mg Qid- "it's the only thing that helps". DEPRESSION (ICD-311) - on WELLBUTRIN XL 300mg /d,  & CELEXA 40mg /d. ~  3/12:  she hasn't seen psychiatry in >10 yrs she says & has severe anxiety issues affecting her medical condition... rec referral back to DrMcKinney et al for their help in managing her problem.    Review of Systems  Constitutional: Positive for fatigue. Negative for fever, chills and unexpected weight change.  HENT: Positive for sore throat, trouble swallowing and voice change. Negative for ear pain, congestion, rhinorrhea, sneezing and postnasal drip.   Respiratory: Positive for cough, choking and shortness of breath. Negative for chest tightness and wheezing.   Cardiovascular: Positive for chest pain and palpitations. Negative for leg swelling.  Gastrointestinal: Negative for nausea, vomiting, abdominal pain, diarrhea and constipation.  Genitourinary: Negative for dysuria, urgency, frequency, hematuria and flank pain.  Musculoskeletal: Positive for myalgias and arthralgias.  Skin: Negative for rash.  Neurological: Positive for weakness and headaches. Negative for dizziness, tremors, seizures, speech difficulty and light-headedness.  Hematological: Negative for adenopathy. Does not bruise/bleed easily.  Psychiatric/Behavioral: Positive for sleep disturbance. Negative for hallucinations and confusion. The patient is nervous/anxious.      Objective:    Physical Exam  Vitals reviewed. Constitutional: She is oriented to person, place, and time. She appears well-developed and well-nourished.  HENT:  Head: Normocephalic and atraumatic.  Right Ear: External ear normal.  Left Ear: External ear normal.  Mouth/Throat: Oropharynx is clear and moist. No oropharyngeal exudate.  Eyes: Conjunctivae and EOM are normal. Pupils are equal, round, and reactive to light.  Neck: Neck supple. No JVD present. No thyromegaly present.  Cardiovascular: Normal rate, regular rhythm, normal heart sounds and intact distal pulses.   Pulmonary/Chest: Effort normal and breath sounds normal. She has no wheezes. She has no rales. She exhibits tenderness.  Abdominal: Soft. Bowel sounds are normal. She exhibits no distension. There is no tenderness.  Musculoskeletal: Normal range of motion. She exhibits tenderness. She exhibits no edema.  Lymphadenopathy:    She has no cervical adenopathy.  Neurological: She is alert and oriented to person, place, and time. She has normal reflexes. No cranial nerve deficit.  Skin: Skin is warm and dry. No rash noted.  Psychiatric: Her mood appears anxious. Her speech is rapid and/or pressured. She is agitated.      Assessment & Plan:   ASPIRATION PNEUMONITIS>  Prev episode resolved, lungs are clear, but she reports continued nocturnal regurg into her throat putting her at high risk for repeat asp episodes;  Rec return to GI for their review & active management of this difficult problem... Prev eval w/ CT Chest showed asp pneumonia & ?reactive adenopathy> repeat CT to re-eval the nodes...  Cig Smoker & CT Chest w/ early centrilobular emphysema>  This info really scared her but she is still smoking (now down to <1/2ppd she says); she filled the Chantix but is too scared to take it, afraid of the side effects (we reviewed the known side effects of smoking!);  She wants repeat CT Chest ==> pending.  Hoarseness/ Globus/ ?VC polyps reported by  the pt>  She will f/u at [facility] w/ GI-DrMadanick, and ENT-DrBuckmire...  GERD & Nocturnal regurg into the throat>  She will  incr the Dexilant to Bid & f/u w/ GI to see what else they might recommend for this difficult problem...  HYPERCHOLESTEROLEMIA>  Continue diet + meds from the Lipid CLinic (currently Zetia & Fenofibrate); repeat FLP at your convenience per [facility] protocol...  Hypothyroid>  Continue Synthroid daily & f/u w/ DrEllison for Endocrine...  Fibromyalgia>  She continues to depend upon Vicodin daily, I have encouraged her to follow up w/ Rheumatology or consider Pain Clinic referral...  Anxiety>  She remains on Wellbutrin, Celexa, Xanax; she was encouraged to follow up w/ her prev Psychiatrist to help w/ her nerves.Marland KitchenMarland Kitchen

## 2010-06-10 NOTE — Patient Instructions (Signed)
Today we updated your med list in EPIC...    Continue your current meds the same...    We will increase the DEXILANT to twice daily per your request...  We will arrange for a follow up GI appt at [facility] you should not be regurgitating into your throat at night!    In the meanwhile> keep the head of your bed up 6" on blocks...    Do not eat or drink anything after your dinner meal (to give your stomach >4H to empty before you lie down)...  Your CXR has cleared, the pneumonia is gone, but you are at risk for aspiration (again)...    You must quit the smoking!!!  Call for any questions...  Let's plan a follow up visit in 3 months.Marland KitchenMarland Kitchen

## 2010-06-15 ENCOUNTER — Ambulatory Visit (INDEPENDENT_AMBULATORY_CARE_PROVIDER_SITE_OTHER)
Admission: RE | Admit: 2010-06-15 | Discharge: 2010-06-15 | Disposition: A | Discharge status: Home / Self Care | Payer: BC Managed Care – PPO | Source: Ambulatory Visit | Attending: Pulmonary Disease | Admitting: Pulmonary Disease

## 2010-06-15 ENCOUNTER — Telehealth: Payer: Self-pay | Admitting: Pulmonary Disease

## 2010-06-15 DIAGNOSIS — R599 Enlarged lymph nodes, unspecified: Secondary | ICD-10-CM

## 2010-06-15 DIAGNOSIS — R591 Generalized enlarged lymph nodes: Secondary | ICD-10-CM

## 2010-06-15 MED ORDER — IOHEXOL 300 MG/ML  SOLN
80.0000 mL | Freq: Once | INTRAMUSCULAR | Status: AC | PRN
  Administered 2010-06-15: 80 mL via INTRAVENOUS

## 2010-06-15 MED ORDER — HYDROCODONE-ACETAMINOPHEN 5-500 MG PO TABS: 1.0000 | ORAL_TABLET | Freq: Four times a day (QID) | ORAL | Status: DC | PRN

## 2010-06-15 NOTE — Telephone Encounter (Signed)
Yes ok to give 5 refills of her vicodin.  thanks

## 2010-06-15 NOTE — Telephone Encounter (Signed)
Spoke with pt and notified rx was called to pharm with 5 refills- I had to leave it on pharmacy VM.

## 2010-06-15 NOTE — Telephone Encounter (Signed)
Addended by: Vernie Murders on: 06/15/2010 04:43 PM   Modules accepted: Orders

## 2010-06-15 NOTE — Telephone Encounter (Signed)
Per [facility] on [address], pt last filed this medication there on 05/11/2010. Please advise if okay for refills.

## 2010-06-16 ENCOUNTER — Telehealth: Payer: Self-pay | Admitting: Pulmonary Disease

## 2010-06-16 NOTE — Telephone Encounter (Signed)
Dr. Kriste Basque, please advise ct chest results, thanks!

## 2010-06-17 NOTE — Telephone Encounter (Signed)
Pt notified of results by Dr. Kriste Basque.

## 2010-06-21 NOTE — Assessment & Plan Note (Signed)
[facility]                               LIPID CLINIC NOTE   Arria, Tasha [name]                    MRN:          [id number]  DATE:09/12/2007                            DOB:          09-09-1960    First office visit for Lipid Clinic.   PAST MEDICAL HISTORY:  1. Fibromyalgia.  2. Depression.  3. Hypothyroidism  4. Positive tobacco abuse.  5. Hyperlipidemia.  6. Gastroesophageal reflux disease.   MEDICATIONS:  1. Wellbutrin XL 300 mg daily.  2. Prevacid 40 mg daily.  3. Celexa 40 mg daily.  4. Synthroid 200 mcg daily.  5. Mirapex 0.25 mg 2 tablets daily.   PHYSICAL EXAMINATION:  VITAL SIGNS:  Weight 185 pounds, blood pressure  124/78, and heart rate 80.   LABORATORY DATA:  Total cholesterol 301, triglyceride 284, HDL 32, and  LDL 211.  LFTs within normal limits.   ASSESSMENT:  [name] is a very nice 50 year old woman who comes to  [facility] today.  No chest pain.  No shortness of breath.  No muscle  aches or pains except for some fibromyalgia pains that she has along her  arms and some in her legs.  She also complains of some burning or  nagging sharp foot pain that she occasionally has, she has seen a  neurologist for it in the past.  However, she did not like the  neurologist and therefore has not followed up with this person.  She  also has had some history of anxiety and depression, which she had been  treated for and had been under the care of a psychiatrist in years past.  She says she is feeling better and has stopped going and is now feeling  terrible again and we had a lengthy discussion regarding the benefit of  reseeking help from the psychiatrist, which I do believe she is going to  go back to see.  She does exercise because of her complaint of  fibromyalgia and some pain.  She says she does very limited movements  daily, some housework, some cooking, and some shopping.  She says that  she does not eat a lot of snacks  or a lot of high-fat foods, but her  vice  is drinking probably 6 cokes a day along with several cups of  sweet tea and several cups of coffee with 3 spoons of sugar in each cup  of coffee.  She eats 3 eggs each morning for breakfast with a piece of  toast with a butter on it.  Her afternoon meal has some type of sandwich  and her evening meal she says is a fairly low-fat, lean protein with  carbohydrates and vegetables.  We had a lengthy discussion today  regarding the benefit of lifestyle modification.  It seems that her  anxiety and depression issue is going to need to be treated first, which  she does seem to be willing to seek care for.  She seems very reluctant  to start any type of exercise regimen.  However, she is very willing to  decrease the sugars  and some fats in her diet.  We have decided that she  would decrease her cokes to 3 a day and replace 3 of her cokes with  water, which she does seem willing to do.  She also will decrease to 2  scoops of sugar in each cup of coffee.  We would just seem to make small  changes with this woman and continue to encourage lifestyle  modification, which seems to be probably the most needed part of her  lipid therapy.  She also is agreeable to starting statin medication.  She was not agreeable in the past when Dr. Lenna Gilford had addressed this  issue, because she is worried that he will make her gain weight.  However, she does understand the seriousness of her hyperlipidemia and  the risk as putting her in and is agreeable to start a statin  medication.   PLAN:  1. To begin Crestor 10 mg daily with plan to increase as needed for a      goal LDL of less than 100, triglyceride goal of less than 150, and      HDL goal of greater than 40.  2. To slowly institute some type of exercise regimen.  3. To decrease sugars in her diet and also decrease some fats.  4. Followup visit in 6 weeks for lipid panel and LFTs and make any      adjustments needed  at that time.      Bonnita Nasuti, PharmD  Electronically Signed      Marijo Conception. Verl Blalock, MD, [facility]  Electronically Signed   LC/MedQ  DD: 09/12/2007  DT: 09/13/2007  Job #: [id number]

## 2010-06-24 NOTE — H&P (Signed)
[facility]  Patient:    Tasha [name], Tasha [name]                      MRN: [id number] Adm. Date:  06/07/00 Attending:  Doran Heater. Warnell Forester, M.D.                         History and Physical  CHIEF COMPLAINT:  Pain, abnormal bleeding.  HISTORY:  Patient is a 50 year old gravida 2, para 2, whose last menstrual period was May 31, 2000.  She has been followed in our office over several years.  She has had increasingly severe pain with her menses and heavy flow to the point where she describes bleeding through her clothing and passing clots every 15 to 20 minutes.  She underwent hysteroscopy, D&C and laparoscopy in August of 2001 with findings of benign endometrium and endocervical mucosa and areas of endometriosis in the pelvis on the laparoscopy exam.  She has been tried on suppressive medications to include oral contraceptives and various progestational agents without success.  She is admitted at this time for laparoscopically assisted vaginal hysterectomy, possible transabdominal hysterectomy, bilateral salpingo-oophorectomy.  She has been fully counseled as to the nature of the procedure and the risks involved to include risks of anesthesia; injury to bowel, bladder, blood vessels, ureters; postoperative hemorrhage or infection; recuperation; use of hormone replacement should her ovaries be removed.  She fully understands all these considerations and wishes to proceed on Jun 07, 2000.  Pap smear was normal in May of 2001.  PAST MEDICAL HISTORY:  Hypothyroidism for which she has been maintained on Synthroid.  She has chronic migraine headaches for which she has been placed on Wellbutrin and Celexa.  History of chest pain for which she has been observed and evaluated with no evidence of any cardiac problems.  She has occasional stress urinary incontinence but not severe.  FAMILY HISTORY:  Essentially noncontributory.  SOCIAL HISTORY:  Patient is a smoker and smokes  approximately one pack of cigarettes per day, which she has done for 20 years.  PAST SURGICAL HISTORY:  She has a history of tubal ligation and the above-noted surgery.  REVIEW OF SYSTEMS:  HEENT:  Headaches as noted above.  CARDIORESPIRATORY:  As noted above.  GASTROINTESTINAL:  Intermittent constipation.  GENITOURINARY: As noted above.  NEUROMUSCULAR:  Negative.  PHYSICAL EXAMINATION:  VITAL SIGNS:  Height 5 feet 6-3/4 inches.  Weight 187 pounds.  Blood pressure 122/74, pulse 72 and regular, respirations 18.  GENERAL:  Well-developed white female in no acute distress.  HEENT:  Within normal limits.  NECK:  Supple without masses, adenopathy or bruits.  HEART:  Regular rate and rhythm without murmurs.  LUNGS:  Clear to P&A.  BREASTS:  Examined sitting and lying, without mass.  Axillae negative.  ABDOMEN:  Soft with tenderness in both lower quadrants.  No palpable masses.  PELVIC:  External genitalia, Bartholins, urethra and Skenes glands within normal limits.  Cervix slightly inflamed.  Uterus midposition, top-normal size, soft, tender.  Adnexa without definite palpable masses but tender bilaterally.  Anterior and posterior cul-de-sac exam is confirmatory.  EXTREMITIES:  Within normal limits.  CENTRAL NERVOUS SYSTEM:  Grossly intact.  SKIN:  Without suspicious lesions.  IMPRESSION: 1. Abnormal uterine bleeding. 2. Pelvic pain. 3. History of endometriosis.  DISPOSITION:  As noted above. DD:  06/05/00 TD:  06/06/00 Job: [id number]

## 2010-06-24 NOTE — Op Note (Signed)
[facility]  Patient:    Tasha [name], Tasha [name]                    MRN: [id number] Proc. Date: 06/07/00 Adm. Date:  [id number] Attending:  Rondell Reams CC:         Calvert Cantor, M.D.   Operative Report  PREOPERATIVE DIAGNOSES:  Abnormal uterine bleeding, pelvic pain, history of endometriosis.  POSTOPERATIVE DIAGNOSES:  Abnormal uterine bleeding, pelvic pain, history of endometriosis, pending pathology.  OPERATION:  Abdominal supracervical hysterectomy.  ANESTHESIA:  General orotracheal.  OPERATOR:  Doran Heater. Warnell Forester, M.D.  FIRST ASSISTANT:  Calvert Cantor, M.D.  INDICATION FOR SURGERY:  The patient is a 50 year old with the above-noted problems, who was counseled as to the need for surgery to treat these problems.  She was fully counseled as to the nature of the procedure and the risks involved to include risks of anesthesia, injury to bowel, bladder, blood vessels, uterus, postoperative hemorrhage, infection, and recuperation.  She fully understands all of these considerations and wishes to proceed on Jun 07, 2000.  OPERATIVE FINDINGS:  An attempt was made to effect descent of the uterus to proceed with a laparoscopically assisted vaginal hysterectomy; however, this was unsuccessful, and an abdominal procedure was done.  On entry into the abdomen, the uterus was noted to be mid posterior, normal size, shape and contour.  There were several small simple cysts on both ovaries which appeared to be totally functional cysts.  Palpation of the upper abdominal viscera to include liver, area of the gallbladder, spleen, kidneys, periaortic areas, and appendix revealed these structures to be normal.  DESCRIPTION OF PROCEDURE:  With the patient under general anesthesia, prepared and draped in the usual sterile fashion in the dorsal lithotomy position, a speculum was placed in the vagina.  A tenaculum was placed on the anterior lip of the cervix, and  an attempt was made to effect descent of the uterus which was unsuccessful.  Accordingly, it was felt that an abdominal approach was indicated.  Foley catheter was placed.  The patient was prepared and draped for an abdominal procedure.  Lower abdominal transverse incision was made and carried into the peritoneal cavity without difficulty.  Self-retaining retractor was placed, and the bowel was packed off.  Kelly clamps were used to clamp the uterine and ovarian anastomoses, tubes, and round ligaments bilaterally for traction hemostasis; these structures were opened with Bovie electrocoagulation with development of bladder flap anteriorly and entry into the retroperitoneal space.  The ovaries did appear to be normal, and it was felt that those should be conserved.  Accordingly, Heaney clamps were placed proximal to the tubes and ovaries bilaterally; these areas were then cut and doubly ligated with #1 chromic catgut.  Uterine vessels bilaterally were then skeletonized, clamped, cut, and suture ligated with #1 chromic catgut. Cardinal bilaterally were likewise clamped, cut, and suture ligated with #1 chromic catgut.  It was then possible to excise the fundus of the uterus with reverse conization fashion using Bovie electrocoagulation to remove the endocervix.  Small bleeders were rendered hematostatic with Bovie electrocoagulation.  Several sutures were necessary in the left cervical angle for hemostasis, and one suture of #1 chromic catgut was placed in the right lateral cervical angle for hemostasis.  The mid portion of the cervix was reapproximated and rendered hemostatic with horizontal mattress suture of #1 chromic catgut.  The area was then lavaged with copious amounts of Lactated Ringers solution, and after  noting that hemostasis was maintained and that sponge and instrument counts were correct, the peritoneum was closed with a continuous suture of 0 Vicryl.  Fascia was closed with  two sutures of 0 Vicryl which were brought from the lateral aspects of the incision and tied separately in the midline.  Subcutaneous fat was reapproximated with interrupted sutures of #1 chromic catgut.  The skin was closed with a subcuticular suture of 3-0 plain catgut.  Solution of 0.5% Marcaine with 1/200 epinephrine was injected subcutaneously for postoperative analgesia. Estimated blood loss 150 mL.  The patient was taken to the recovery room in good condition with clear urine in the Foley catheter tubing.  She will be placed on 23-hour observation following surgery. DD:  06/07/00 TD:  06/07/00 Job: [id number]

## 2010-06-24 NOTE — Discharge Summary (Signed)
[facility]  Patient:    Tasha [name], Tasha [name]                    MRN: [id number] Adm. Date:  [id number] Disc. Date: [id number] Attending:  Rondell Reams                           Discharge Summary  HISTORY:  The patient is a 50 year old with abnormal uterine bleeding, pelvic pain, history of endometriosis who was admitted on Jun 07, 2000 for hysterectomy, possible bilateral salpingo-oophorectomy. The remainder of her history and physical are as previously dictated.  LABORATORY DATA:  Preoperative hemoglobin 12.8. Electrocardiogram showed nonspecific T wave abnormalities, but otherwise normal. Chest x-ray normal except for slight linear scarring in the right lung base, questionably related to fibrosis.  HOSPITAL COURSE:  The patient was taken to the operating room on Jun 07, 2000 at which time abdominal supracervical hysterectomy was performed. The patient did well postoperatively. Diet and ambulation were progressed over the evening of May 2 and early morning of Jun 08, 2000. On the morning of Jun 08, 2000 she was afebrile and experiencing no problems except for pain which was controlled by pain medication. It was felt that she could be discharged at this time.  FINAL DIAGNOSES: 1. Abnormal uterine bleeding. 2. Pelvic pain. 3. History of endometriosis.  OPERATION:  Abdominal supracervical hysterectomy.  PATHOLOGY REPORT:  Unavailable at the time of dictation.  DISPOSITION:  Discharged home to return to the office in two weeks for followup. She was instructed to gradually progress her activities over several weeks at home, and to limit lifting and driving. She was fully ambulatory, on a regular diet, and in good condition at the time of discharge.  DISCHARGE MEDICATIONS:  She was given a prescription for [facility], #30, to be taken one or two q.4h. p.r.n. pain, and doxycycline 100 mg, #10, to be taken one b.i.d. DD:  06/08/00 TD:   06/10/00 Job: [id number]

## 2010-07-08 ENCOUNTER — Telehealth: Payer: Self-pay | Admitting: Pulmonary Disease

## 2010-07-08 MED ORDER — ALPRAZOLAM 0.5 MG PO TABS: ORAL_TABLET | ORAL | Status: DC

## 2010-07-08 NOTE — Telephone Encounter (Signed)
Called refills of alprazolam into pharmacy--called and left message on machine to make pt aware.

## 2010-07-11 ENCOUNTER — Telehealth: Payer: Self-pay | Admitting: Pulmonary Disease

## 2010-07-11 NOTE — Telephone Encounter (Signed)
Called, spoke with pt.  C/o throat hurting "all the time."  States it also feels bruised when she touches it and glands are swollen too.  States she has an appt with Dr. Ezzard Standing, ENT, today at 2pm and will have him look at this.  States nothing needed from SN now and will call back if this changes.

## 2010-07-29 ENCOUNTER — Telehealth: Payer: Self-pay | Admitting: Pulmonary Disease

## 2010-07-29 NOTE — Telephone Encounter (Signed)
lmomtcb  

## 2010-07-30 ENCOUNTER — Telehealth: Payer: Self-pay | Admitting: Pulmonary Disease

## 2010-07-30 MED ORDER — PREDNISONE 20 MG PO TABS: ORAL_TABLET | ORAL | Status: DC

## 2010-07-30 NOTE — Telephone Encounter (Signed)
Pt calls with complaint of poison ivy or poison oak exposure.  Has been outside working in vines/shrubs, and now has rash on neck and arms that is red and very pruritic.  Has been exposed before.    Recommend getting otc special wash to get oils off her skin, and will call in prescription for prednisone.

## 2010-08-01 MED ORDER — DOXYCYCLINE HYCLATE 100 MG PO TABS: 100.0000 mg | ORAL_TABLET | Freq: Two times a day (BID) | ORAL | Status: AC

## 2010-08-01 NOTE — Telephone Encounter (Signed)
Doxycyline 100mg  Twice daily  For 7 days w/ food #14 , no refills  Mucinex DM Twice daily  As needed  Cough/congestion  Fluids and rest  Please contact office for sooner follow up if symptoms do not improve or worsen or seek emergency care '

## 2010-08-01 NOTE — Telephone Encounter (Signed)
Called and spoke with pt.  Pt c/o headache, body ache, increased sob, tightness in chest, hoarseness, body sweats but denies checking her temp, and coughing up clear to white to yellow colored sputum.  States symptoms started last week on Wednesday.  Pt is requesting SN's recs.  Please advise. Thanks. No Known Allergies

## 2010-08-01 NOTE — Telephone Encounter (Signed)
Spoke with pt and notified of recs per TP. Pt verbalized understanding. Rx for doxy was sent to pharmacy.

## 2010-08-25 ENCOUNTER — Telehealth: Payer: Self-pay | Admitting: Pulmonary Disease

## 2010-08-25 NOTE — Telephone Encounter (Signed)
Looks like rx for this was sent #90 qid prn on 07/08/10- it should have been #120 tablets, ? When did she pick up? LMTCB

## 2010-08-25 NOTE — Telephone Encounter (Signed)
LMOMTCB x 1 

## 2010-08-25 NOTE — Telephone Encounter (Signed)
Pt returning call [place]

## 2010-08-26 MED ORDER — HYDROCODONE-ACETAMINOPHEN 5-500 MG PO TABS: 1.0000 | ORAL_TABLET | Freq: Four times a day (QID) | ORAL | Status: DC | PRN

## 2010-08-26 NOTE — Telephone Encounter (Signed)
Spoke with pt and notified of recs per SN. Pt verbalized understanding and denied any further questions. I called in new rx for vicodin to [facility] on [address]. Had to leave rx on the pharmacy VM.

## 2010-08-26 NOTE — Telephone Encounter (Signed)
Spoke with the pt and she has 3 issues to discuss. 1) she states she was getting #120 of xanax in the past but the last refill was only for #90 and she wanted to know why. I reviewed her chart and all previous refills were for #120 with direction to not exceed 4 times a day, so I advised I will correct this rx for her, so nothing further needed for this.   2) The pt states that she has been having increased pain in her legs and the vicodin is not helping like it used to. She described the pain as an ache in her muscles all day. She states it keeps her from sleeping as well.  She is taking Vicodin 3 times a day. She wants to know can Dr. Kriste Basque either increase the strength of increase the frequency of the vicodin?  3) Pt also c/o dry cough and chest tightness x 1 month. She states it has never completely went away after she had pneumonia.  She denies any SOB, fever, chest congestion. She has not tried anything OTC for this.  Please advise on #2 and #3. Thanks. Carron Curie, CMA No Known Allergies

## 2010-08-26 NOTE — Telephone Encounter (Signed)
Per SN----ok to increase to qid  #120 for the vicodin and  Add mucinex max 1 bid with plenty of fluids and she will need to follow up with rheumatology--if she has one we can get her an appt but if not we can send in the referral. thanks

## 2010-11-09 LAB — CBC
HCT: 41.8
Hemoglobin: 14
MCHC: 33.6
MCV: 97.7
Platelets: 209
RBC: 4.28
RDW: 12.7
WBC: 9.6

## 2010-11-09 LAB — DIFFERENTIAL
Basophils Absolute: 0.1
Basophils Relative: 1
Eosinophils Absolute: 0
Eosinophils Relative: 0
Lymphocytes Relative: 38
Lymphs Abs: 3.6
Monocytes Absolute: 0.5
Monocytes Relative: 5
Neutro Abs: 5.4
Neutrophils Relative %: 56

## 2010-11-09 LAB — RHEUMATOID FACTOR: Rhuematoid fact SerPl-aCnc: 53 — ABNORMAL HIGH

## 2010-11-09 LAB — SEDIMENTATION RATE: Sed Rate: 7

## 2010-12-26 ENCOUNTER — Other Ambulatory Visit: Payer: Self-pay | Admitting: Pulmonary Disease

## 2010-12-26 ENCOUNTER — Telehealth: Payer: Self-pay | Admitting: Pulmonary Disease

## 2010-12-26 MED ORDER — TRAMADOL HCL 50 MG PO TABS: 50.0000 mg | ORAL_TABLET | Freq: Three times a day (TID) | ORAL | Status: DC | PRN

## 2010-12-26 MED ORDER — ALPRAZOLAM 0.5 MG PO TABS: ORAL_TABLET | ORAL | Status: DC

## 2010-12-26 NOTE — Telephone Encounter (Signed)
Per leigh okay to call in alprazolam #90 0 refills. I called and made pt aware of this and that rx for vicodin has been called in.

## 2010-12-26 NOTE — Telephone Encounter (Signed)
Pt stated she wants 120 not 90.  Please call pt.  Tasha [name]

## 2011-01-25 ENCOUNTER — Encounter: Payer: Self-pay | Admitting: Pulmonary Disease

## 2011-01-25 ENCOUNTER — Ambulatory Visit (INDEPENDENT_AMBULATORY_CARE_PROVIDER_SITE_OTHER): Payer: BC Managed Care – PPO | Admitting: Pulmonary Disease

## 2011-01-25 DIAGNOSIS — K219 Gastro-esophageal reflux disease without esophagitis: Secondary | ICD-10-CM

## 2011-01-25 DIAGNOSIS — K589 Irritable bowel syndrome without diarrhea: Secondary | ICD-10-CM

## 2011-01-25 DIAGNOSIS — E785 Hyperlipidemia, unspecified: Secondary | ICD-10-CM

## 2011-01-25 DIAGNOSIS — F329 Major depressive disorder, single episode, unspecified: Secondary | ICD-10-CM

## 2011-01-25 DIAGNOSIS — R51 Headache: Secondary | ICD-10-CM

## 2011-01-25 DIAGNOSIS — R079 Chest pain, unspecified: Secondary | ICD-10-CM

## 2011-01-25 DIAGNOSIS — E039 Hypothyroidism, unspecified: Secondary | ICD-10-CM

## 2011-01-25 DIAGNOSIS — IMO0001 Reserved for inherently not codable concepts without codable children: Secondary | ICD-10-CM

## 2011-01-25 DIAGNOSIS — F411 Generalized anxiety disorder: Secondary | ICD-10-CM

## 2011-01-25 DIAGNOSIS — F3289 Other specified depressive episodes: Secondary | ICD-10-CM

## 2011-01-25 DIAGNOSIS — F17200 Nicotine dependence, unspecified, uncomplicated: Secondary | ICD-10-CM

## 2011-01-25 MED ORDER — ALPRAZOLAM 0.5 MG PO TABS: ORAL_TABLET | ORAL | Status: DC

## 2011-01-25 MED ORDER — HYDROCODONE-ACETAMINOPHEN 5-500 MG PO TABS: 1.0000 | ORAL_TABLET | Freq: Four times a day (QID) | ORAL | Status: DC | PRN

## 2011-01-25 NOTE — Patient Instructions (Signed)
Today we updated your med list in our EPIC system...    Continue your current medications the same...    We agreed to refill your Vicodin pain pill & Xanax nerve pill for #120- not to exceed 4 per day...  Call for any questions...  Let's plan a follow up visit in 6 months w/ FASTING blood work at that time.Marland KitchenMarland Kitchen

## 2011-01-25 NOTE — Progress Notes (Signed)
Subjective:    Patient ID: Tasha [name], female    DOB: [birth year], 50 y.o.   MRN: [id number]  HPI 50 y/o WF here for a follow up visit... she has multiple medical problems including Asp Pneumonia from LER;  Cigarette smoker;  ?MVP w/ CP & Palpit;  Severe Hypercholesterolemia;  Hypothyroidism;  GERD & Dysphagia w/ LER & asp pneum;  IBS/ Colon polyps;  Abn LFTs from hepatic steatosis;  Severe FM;  Chr headaches;  RLS;  Anxiety & Depression...   ~  April 08, 2010:  She has hx chronic GI problems including GERD, Dysphagia, & IBS (which she notes is one of her chr disability diagnoses)... recently she has noted incr dysphagia & globus phenomenon ("lump in my throat & feeling like I'm swallowing golf balls") & she wanted referral to ENT- DrRosen (we don't have his consult note)... she was referred by DrGessner to [facility] & seen 03/07/10- his note is reviewed (pt indicates she was told that her swallowing difficulty was related to her nerves)... they indicated that they were going to do CT Neck (due to the globus sensation), Ba Swallow, refer to ENT- DrBuckmire for eval hoarseness, & rx w/ antireflux regimen & continued PPI meds (CT report, MBS, & ENT notes ===> all pending) ... DrMadanick called to tell me that the CT neck showed mediastinal adenopathy & she needed dedicated CT Chest & pulm f/u here... [name] says DrBuckmire found 11-12 polyps on her cords (we do not have any of his notes) & rec speech therapy.    She is a 1ppd smoker x30+yrs... last CXR 9/10 was clear, NAD... 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 in this regard she is certainly a set-up for aspiration> current CXR shows patchy right mid lung opac & CT Chest today revealed mild centrilob emphysema,  focal airsp dis RUL, ?reactive adenopathy vs other etiology, & hepatic steatosis >> we discussed these findings & rec treatment for prob asp pneumonia w/ short term follow up & eventual repeat CT later...  ~  April 28, 2010:  She is s/p Augmentin & Pred taper w/ Mucinex/ Tussionex/ etc;  Clinically improved w/ decr cough/ phlegm/ dyspnea/ etc;  CXR today is similarly improved w/ resolution of the right sided pneumonia;  She still has mult somatic complaints w/ cough, beige sput, no hemoptysis, +dyspnea, +left post CWP, etc;  We reviewed her critically important antireflux regimen w/ elev HOB 6", NPO after dinner, continue Dexilant bid, etc;  She has cut back smoking to <1/2 ppd & she is asking for Chantix (Rx written);  She is going to proceed w/ the speech path training at [facility] & f/u w/ ENT DrBuckmire regarding her VC polyps;  We will recheck pt w/ CXR in 6weeks...  ~  Jun 10, 2010:   6 week ROV & she reports about the same> mult somatic complaints, very anxious about the prev CT showing mild centilob emphysema (the main findings were asp pneumonitis & reactive adenopathy);  She tells me she filled the Chantix Rx but hasn't taken it (scared of the side effects), still smoking but <1/2ppd now;  Prev CXR 04/28/10 showed resolution of the infiltrates, both lungs were clear, & she was reassured...  In the interval  she reports accompanying a friend w/ abd pain to the ER & mentioned to the staff that she had a cough & noted a sm amt of blood tinged mucus (it was actually a bloody nasal discharge & sm amt of blood in mouth); they did another CXR dated 06/01/10 & this too was clear & WNL; but the ER eval was very stressful...  The biggest concern at present is the continued globus phenomenon, difficulty swallowing & occas nocturnal regurg into her throat (obviously the source of her prev aspiration episode)> she notes that this occurs once or twice weekly despite her maximum antireflux regimen etc...  We reviewed the  antireflux recommendations; she requests increase Dexilant to Bid- OK; she requests follow up appt w/ DrMadanick at [facility] & we will set this up for her; finally she wants f/u CT Chest since being told she has emphysema scared her, & we want it to follow up on the prev noted adenopathy (CT Chest 5/12 showed resolving infiltrate, decr adenopathy, NAD- she was pleased there was no mention of emphysema)...  ~  January 25, 2011:  33mo ROV & she is c/o pain all over & she wants refills on the "absolute max Vicodin & Xanax (both Qid= 4/d)... She notes lots of HAs, LBP, legs ache & feet hurt all the time; lots of stress as daugh is preg w/ twins...  She is still smoking 1/2ppd & declines smoking cessation counseling;  She denies cough, sputum or SOB; states her swallowing is good w/o reflux or choking; weight is up 8# to 191# today despite attempts at diet/ exercise/ etc; she stopped the Zetia & Fenofib from the lipid clinic ?why, & is encouraged to f/u w/ them; still on Dexilant Bid w/ globus phenomenon & she never went back to see DrMadanick at [facility]...  We discussed & I offered Rheum eval, HA clinic referral, Psychiatric referral but she declines these interventions...         Problem List:  Hx of TMJ PAIN (ICD-524.62)> GLOBUS Phenomenon believed related to GERD> OROPHARYNGEAL DYSPHAGIA> ~  9/10: ENT eval by Darlin Priestly (referred by DrSMiller)- Globus likely due to reflux, continue PPI Bid, but she only takes intermittently... ~  1/12:  Pt requested ENT second opinion w/ DrRosen- referred> we don't have copies of his eval... ~  1/12 & 2/12:  Eval at [facility] by GI-DrMadanick & ENT-DrBuckmire> pt reports they found polyps on her vocal cords.  CIGARETTE SMOKER (ICD-305.1)> she continues to smoke cigarettes daily... ASPIRATION PNEUMONIA> this occurred 2/12 & was referred to [place] for f/u from [facility]... Mild Centrilobular Emphysema> noted on CT Chest 3/12... ~  CXR 9/10 showed norm heart size, clear lungs, NAD.Marland Kitchen. ~   CXR 2/12 showed patchy opacity right mid lung/ RML... ~  CT Chest 3/12 showed RUL airsp dis & thoracic adenopathy ?reactive, mild centilob emphysema, hep steatosis.Marland Kitchen. ~  f/u CXR 3/12 & 4/12> resolution of the right sided infiltrate, clear, WNL.Marland Kitchen. ~  f/u CT Chest 5/12 showed resolution of prev airsp dis, no suspicious nodules or masses, decreased size of nodes, NAD.Marland KitchenMarland Kitchen  ? of MITRAL VALVE PROLAPSE (ICD-424.0) - followed by Walker Kehr for Cards... she has intermittent CP (likely related to her FM), and palpitations that continue to be a problem for her- advised to quit nicotine & caffeine...  ~  2DEcho 10/06 was normal without MVP seen, no MR, norm LVF... prev 2D in 1990 w/ mild bowing of ant leaflet c/w mild MVP at that time... ~  NuclearStressTest 2/02 was  WNL- no ischemia, no infarction, EF=73%... ~  Cardiac eval 9/10 by Walker Kehr for atyp CP- NuclearStressTest was normal- EF=70%...  HYPERCHOLESTEROLEMIA (ICD-272.0) - hx of Chol values >300 in the past (esp when thyroid was underactive)... she has been intolerant to all statin meds and has been managed by the Lipid Clinic> currently on diet + Zetia & Fenofibrate==> she stopped these on her own... ~  FLP 7/09 showed TChol 301, TG 284, HDL 32, LDL 210... referred to LipidClinic, tried on CRESTOR5 but she stopped due to leg pain. ~  FLP 7/10 showed TChol 318, TG 318, HDL 32, LDL 228... given [id number], but stopped this on her own... rec> return to LipidClinic. ~  FLP 1/11 showed TChol 283, TG 271, HDL 32, LDL 212... she reports trial LIPITOR but intol w/ leg pain... ~  5/11:  we discussed trial of LIVALO 2mg  samples start 1/2 tab Qhs... she reports stopping this med after 2 wks c/o leg pains & she is not willing to continue statin Rx ==> rec return to Lipid Clinic. ~  2/12: she reports back on diet + Zetia/ Fenofibrate per [facility] staff (note: TChol in 2011= 283-310 range). ~  Followed via the Lipid Clinic> she has been intermittent about filling her Rx & taking  her meds for Chol...  HYPOTHYROIDISM (ICD-244.9) - on SYNTHROID 169mcg/d... hypothyroidism initially diagnosed in the 1980's and she's been on meds ever since then... ~  labs 12/08 showed TSH= 31 ?off her meds... rec to restart her Synthroid regularly... ~  labs 7/09 showed TSH= 2.24 ~  labs 3/10 showed TSH= 0.95 ~  labs 7/10 showed TSH= 4.22 ~  labs 1/11 showed TSH= 10.95... pt reminded to take Synthroid dose 168mcg/d regularly. ~  3/11: pt saw DrEllison for consult & Synthroid incr to 137mcg/d but she "didn't feel right" on this & ?ret to 173mcg/d. ~  2/12:  labs showed TSH=29.4 & she states taking med daily but confirmed w/ pharm that she's been out for 2weeks!  GERD (ICD-530.81) & DYSPHAGIA UNSPECIFIED (ICD-787.20) - prev GI eval by DrSamLeBauer- now sees DrGessner & was advised to quit smoking & caffeine, +given Aciphex & Carafate but she stopped both meds and used Tums/ Rolaids/ Maalox etc... now she notes best response to [facility] 60mg  1-2 daily... ~  EGD 10/10 by DrGessner showed severe gastritis, norm esoph, norm duodenum, neg HPylori. ~  pt referred to [facility] by DrGessner but cancelled when all her symptoms improved on DEXILANT 60mg /d. ~  1/12:  pt requested referral to [facility] for her GERD, dysphagia, LER, Globus> seen by [facility] & his note is reviewed... ~  5/12:  Pt notes continued globus sensation making swallowing difficult & nocturnal regurgitation into her throat occuring 1-2 per week; referred back to [facility] (she never went).  IBS (ICD-564.1) - colonoscopy 3/93 by DrSam was WNL...symptoms were felt to be IBS... ~  CTAbd 8/06 showed fatty liver, neg abd, s/p hyst, left ovarian cyst, norm right ovary... ~  Colonoscopy 3/10 by drGessner showed 7mm hyperplastic polyp, hems, otherw neg... f/u planned 46yrs. ~  5/10:  f/u DrGessner w/ IBS/ gas/ alt bowel habits- given ALIGN & BENTYL...  LIVER FUNCTION TESTS, ABNORMAL (ICD-794.8) - she had second GI opinion and eval of AbnLFT's by  DrBuccini in 2002... he felt she had Fatty Liver Disease... she's been counselled to lose weight/ low fat diet/ incr exercise... ~  labs 7/09 showed SGOT= 45, SGPT= 65 ~  labs 3/10 showed SGOT= 48, SGPT= 64 ~  labs  7/10 showed SGOT= 59, SGPT= 77 ~  labs 1/11 showed SGOT= 42, SGPT= 50 ~  labs 2/12 showed SGOT= 34, SGPT= 36; CT Chest showed hepatic steatosis...  FIBROMYALGIA (ICD-729.1) - on MOBIC 15mg  as needed... over the years she has seen DrTruslow, DrZeminski, & DrDeveshwar for Rheum opinions... she has also been treated by DrWillen/chiropractor... she has tried Flexeril, DCN100, Vicodin, Mobic, Skelaxin... ~  11/11:  she requests refill of Vicodin, Mobic, Mirapex...  HEADACHE, CHRONIC (ICD-784.0) - on VICODIN limited to 3/d no early refills... she has been eval by Dr Meryl Crutch & DrWeymann for Neurology- they rec the Wellbutrin & Celexa... [place] w/ DrHaworth for c/o numbness & tingling- (of note he recorded that her ROS was pos for every question that he asked her)... he rec MRI Brain, NCV's, and extensive lab work- we never received a follow up note after the initial visit... prev MRI from Neuro in [place] was WNL in the 1990's... ~  9/10: eval by DrReynolds- he thought Migraines & Rx w/ MAXALT... ~  6/11:  f/u Neuro w/ DrReynolds- continue Maxalt & add NORTRIPTYLINE 25mg  Qhs prophylaxis.  PERIODIC LIMB MOVEMENT DISORDER (ICD-327.51) - she had a prev sleep study 1/04 w/ signif periodic limb movements (leg jerks) w/ sleep disruption (study showed RDI= 1, but PLM index was 11- mostly arousals)... tried on MIRAPEX 0.25mg - 2 Qhs but she didn't continue> asked for refill Rx 11/11...  ANXIETY (ICD-300.00) - on ALPRAZOLAM 0.5mg Qid- "it's the only thing that helps". DEPRESSION (ICD-311) - on WELLBUTRIN XL 300mg /d,  & CELEXA 40mg /d. ~  3/12:  she hasn't seen psychiatry in >10 yrs she says & has severe anxiety issues affecting her medical condition... rec referral back to DrMcKinney  et al for their help in managing her problem.   Past Surgical History  Procedure Date  . Tonsillectomy   . Wrist surgery   . Arm surgery   . Foot surgery     Outpatient Encounter Prescriptions as of 01/25/2011  Medication Sig Dispense Refill  . ALPRAZolam (XANAX) 0.5 MG tablet As needed four times a day not to exceed 4 tablets per day and no early refills  120 tablet  5  . buPROPion (WELLBUTRIN XL) 300 MG 24 hr tablet TAKE ONE TABLET BY MOUTH EVERY DAY  30 tablet  0  . citalopram (CELEXA) 40 MG tablet TAKE ONE TABLET BY MOUTH EVERY DAY  30 tablet  0  . dexlansoprazole (DEXILANT) 60 MG capsule Take 1 capsule (60 mg total) by mouth 2 (two) times daily. 30 minutes before meals  60 capsule  6  . HYDROcodone-acetaminophen (VICODIN) 5-500 MG per tablet Take 1 tablet by mouth every 6 (six) hours as needed for pain (NOT TO EXCEED 4 PER DAY). For pain  120 tablet  5  . levothyroxine (SYNTHROID, LEVOTHROID) 150 MCG tablet TAKE ONE TABLET BY MOUTH EVERY DAY  30 tablet  0  . meloxicam (MOBIC) 15 MG tablet TAKE ONE TABLET BY MOUTH EVERY DAY AS NEEDED FOR JOINT PAIN  30 tablet  0  . traMADol (ULTRAM) 50 MG tablet Take 1 tablet (50 mg total) by mouth every 8 (eight) hours as needed for pain.  90 tablet  0  . DISCONTD: ALPRAZolam (XANAX) 0.5 MG tablet As needed four times a day not to exceed 4 tablets per day and no early refills  90 tablet  0  . DISCONTD: HYDROcodone-acetaminophen (VICODIN) 5-500 MG per tablet TAKE ONE TABLET BY MOUTH EVERY 6 HOURS  AS NEEDED  90 tablet  0  . ezetimibe (ZETIA) 10 MG tablet Take 10 mg by mouth daily.        . fenofibrate 54 MG tablet Take 54 mg by mouth daily.        Marland Kitchen guaiFENesin (MUCINEX) 600 MG 12 hr tablet Take 1,200 mg by mouth 2 (two) times daily.        . pramipexole (MIRAPEX) 0.25 MG tablet 2 tablets by mouth at bedtime       . predniSONE (DELTASONE) 20 MG tablet Take 2 each day for 2 days, then 1 each day for 2 days,then 1/2 each day for 2 days, then stop  7  tablet  0  . DISCONTD: varenicline (CHANTIX PAK) 0.5 MG X 11 & 1 MG X 42 tablet Take one 0.5mg  tablet by mouth once daily for 3 days, then increase to one 0.5mg  tablet twice daily for 3 days, then increase to one 1mg  tablet twice daily.         No Known Allergies   Current Medications, Allergies, Past Medical History, Past Surgical History, Family History, and Social History were reviewed in [employer] record.    Review of Systems  Constitutional: Positive for fatigue. Negative for fever, chills and unexpected weight change.  HENT: Positive for sore throat, trouble swallowing and voice change. Negative for ear pain, congestion, rhinorrhea, sneezing and postnasal drip.   Respiratory: Positive for cough, choking and shortness of breath. Negative for chest tightness and wheezing.   Cardiovascular: Positive for chest pain and palpitations. Negative for leg swelling.  Gastrointestinal: Negative for nausea, vomiting, abdominal pain, diarrhea and constipation.  Genitourinary: Negative for dysuria, urgency, frequency, hematuria and flank pain.  Musculoskeletal: Positive for myalgias and arthralgias.  Skin: Negative for rash.  Neurological: Positive for weakness and headaches. Negative for dizziness, tremors, seizures, speech difficulty and light-headedness.  Hematological: Negative for adenopathy. Does not bruise/bleed easily.  Psychiatric/Behavioral: Positive for sleep disturbance. Negative for hallucinations and confusion. The patient is nervous/anxious.      Objective:   Physical Exam  Vitals reviewed. Constitutional: She is oriented to person, place, and time. She appears well-developed and well-nourished.  HENT:  Head: Normocephalic and atraumatic.  Right Ear: External ear normal.  Left Ear: External ear normal.  Mouth/Throat: Oropharynx is clear and moist. No oropharyngeal exudate.  Eyes: Conjunctivae and EOM are normal. Pupils are equal, round, and reactive to  light.  Neck: Neck supple. No JVD present. No thyromegaly present.  Cardiovascular: Normal rate, regular rhythm, normal heart sounds and intact distal pulses.   Pulmonary/Chest: Effort normal and breath sounds normal. She has no wheezes. She has no rales. She exhibits tenderness.  Abdominal: Soft. Bowel sounds are normal. She exhibits no distension. There is no tenderness.  Musculoskeletal: Normal range of motion. She exhibits tenderness. She exhibits no edema.  Lymphadenopathy:    She has no cervical adenopathy.  Neurological: She is alert and oriented to person, place, and time. She has normal reflexes. No cranial nerve deficit.  Skin: Skin is warm and dry. No rash noted.  Psychiatric: Her mood appears anxious. Her speech is rapid and/or pressured. She is agitated.    RADIOLOGY DATA:  Reviewed in the EPIC EMR & discussed w/ the patient...  LABORATORY DATA:  Reviewed in the EPIC EMR & discussed w/ the patient...    Assessment & Plan:   ASPIRATION PNEUMONITIS>  Prev episode resolved, lungs are clear, but she reports continued nocturnal regurg into her throat  putting her at high risk for repeat asp episodes;  Rec return to GI for their review & active management of this difficult problem... Prev eval w/ CT Chest showed asp pneumonia & ?reactive adenopathy> repeat CT to re-eval the nodes showed resolved RUL infiltrate & decr size of nodes...  Cig Smoker & CT Chest w/ early centrilobular emphysema>  This info really scared her but she is still smoking (now down to <1/2ppd she says); she filled the Chantix but is too scared to take it, afraid of the side effects (we reviewed the known side effects of smoking!)... NOTE> on repeat CT Chest the radiologist did not call emphysema & she is very pleased w/ this...  Hoarseness/ Globus/ ?VC polyps reported by the pt>  She will f/u at [facility] w/ GI-DrMadanick, and ENT-DrBuckmire...  GERD & Nocturnal regurg into the throat>  She will keep the Dexilant to Bid  & f/u w/ GI to see what else they might recommend for this difficult problem...  HYPERCHOLESTEROLEMIA>  Continue diet + meds from the Lipid Clinic (currently Zetia & Fenofibrate); repeat FLP at your convenience per [facility] protocol...  Hypothyroid>  Continue Synthroid daily & f/u w/ DrEllison for Endocrine...  Fibromyalgia>  She continues to depend upon Vicodin daily, I have encouraged her to follow up w/ Rheumatology or consider Pain Clinic referral...  Anxiety>  She remains on Wellbutrin, Celexa, Xanax; she was encouraged to follow up w/ her prev Psychiatrist to help w/ her nerves...   Patient's Medications  New Prescriptions   No medications on file  Previous Medications   BUPROPION (WELLBUTRIN XL) 300 MG 24 HR TABLET    TAKE ONE TABLET BY MOUTH EVERY DAY   CITALOPRAM (CELEXA) 40 MG TABLET    TAKE ONE TABLET BY MOUTH EVERY DAY   DEXLANSOPRAZOLE (DEXILANT) 60 MG CAPSULE    Take 1 capsule (60 mg total) by mouth 2 (two) times daily. 30 minutes before meals   EZETIMIBE (ZETIA) 10 MG TABLET    Take 10 mg by mouth daily.     FENOFIBRATE 54 MG TABLET    Take 54 mg by mouth daily.     GUAIFENESIN (MUCINEX) 600 MG 12 HR TABLET    Take 1,200 mg by mouth 2 (two) times daily.     LEVOTHYROXINE (SYNTHROID, LEVOTHROID) 150 MCG TABLET    TAKE ONE TABLET BY MOUTH EVERY DAY   MELOXICAM (MOBIC) 15 MG TABLET    TAKE ONE TABLET BY MOUTH EVERY DAY AS NEEDED FOR JOINT PAIN   PRAMIPEXOLE (MIRAPEX) 0.25 MG TABLET    2 tablets by mouth at bedtime    PREDNISONE (DELTASONE) 20 MG TABLET    Take 2 each day for 2 days, then 1 each day for 2 days,then 1/2 each day for 2 days, then stop   TRAMADOL (ULTRAM) 50 MG TABLET    Take 1 tablet (50 mg total) by mouth every 8 (eight) hours as needed for pain.  Modified Medications   Modified Medication Previous Medication   ALPRAZOLAM (XANAX) 0.5 MG TABLET ALPRAZolam (XANAX) 0.5 MG tablet      As needed four times a day not to exceed 4 tablets per day and no early refills    As  needed four times a day not to exceed 4 tablets per day and no early refills   HYDROCODONE-ACETAMINOPHEN (VICODIN) 5-500 MG PER TABLET HYDROcodone-acetaminophen (VICODIN) 5-500 MG per tablet      Take 1 tablet by mouth every 6 (six) hours as needed for  pain (NOT TO EXCEED 4 PER DAY). For pain    TAKE ONE TABLET BY MOUTH EVERY 6 HOURS AS NEEDED  Discontinued Medications   VARENICLINE (CHANTIX PAK) 0.5 MG X 11 & 1 MG X 42 TABLET    Take one 0.5mg  tablet by mouth once daily for 3 days, then increase to one 0.5mg  tablet twice daily for 3 days, then increase to one 1mg  tablet twice daily.

## 2011-02-13 ENCOUNTER — Encounter: Payer: Self-pay | Admitting: Pulmonary Disease

## 2011-02-22 ENCOUNTER — Telehealth: Payer: Self-pay | Admitting: Pulmonary Disease

## 2011-02-22 NOTE — Telephone Encounter (Signed)
I spoke with pt and she c/o pain under left side arm pit and radiates to the from of arm, pain comes and goes x yesterday morning. Denies any SOB, chest pain. Pt states she takes Vicodin for the pain. Pt states she could really describe the pain. Pt is requesting recs from Dr. Kriste Basque, please advise thanks

## 2011-02-22 NOTE — Telephone Encounter (Signed)
Per SN-this is good as far as no SOB or chest pain. Pt should rest and apply heat to the area; try myoflex cream, and f/u with RA doctor regarding pain. Pt understands recs from SN.

## 2011-02-24 ENCOUNTER — Other Ambulatory Visit: Payer: Self-pay | Admitting: Pulmonary Disease

## 2011-03-27 ENCOUNTER — Other Ambulatory Visit: Payer: Self-pay | Admitting: Pulmonary Disease

## 2011-04-04 ENCOUNTER — Telehealth: Payer: Self-pay | Admitting: Pulmonary Disease

## 2011-04-04 DIAGNOSIS — H9209 Otalgia, unspecified ear: Secondary | ICD-10-CM

## 2011-04-04 NOTE — Telephone Encounter (Signed)
LMTCB

## 2011-04-05 NOTE — Telephone Encounter (Signed)
LMOMTCB x 1 

## 2011-04-06 ENCOUNTER — Telehealth: Payer: Self-pay | Admitting: Pulmonary Disease

## 2011-04-06 NOTE — Telephone Encounter (Signed)
lmomtcb  

## 2011-04-06 NOTE — Telephone Encounter (Signed)
Pt c/o right ear pain x 3 weeks. She says the pain is not there all the time. It also feels like there is something moving inside the ear when she turns her head and she has dizzy spells. Pls advise on referral to ENT. This is what the pt is asking for.

## 2011-04-06 NOTE — Telephone Encounter (Signed)
Per SN refer to Dr. Ernesto Rutherford. Referral placed and pt aware.Jeanerette Bing, CMA

## 2011-04-07 MED ORDER — FLUCONAZOLE 100 MG PO TABS: ORAL_TABLET | ORAL | Status: DC

## 2011-04-07 NOTE — Telephone Encounter (Signed)
Pt returned triage's call.  Holly D Pryor ° °

## 2011-04-07 NOTE — Telephone Encounter (Signed)
Pt states she forgot to mention that she has raw white areas in her mouth. They are located on the sides of her cheeks and along the inside of her bottom lip. She has been using MMW but this is not helping. She would like something called in for this. She says she noticed this about 4 days ago and it seems to be getting worse. Pls advise.No Known Allergies

## 2011-04-07 NOTE — Telephone Encounter (Signed)
Per SN- if MMW not helping, the other option is call in diflucan 100 mg # 8 take 2 today, then 1 daily until gone. Next step after that would be to refer to oral surgeon.  Spoke with pt and notified of this and she verbalized understanding. States wants to try diflucan and so I sent rx for this to her pharm.

## 2011-04-27 ENCOUNTER — Other Ambulatory Visit: Payer: Self-pay | Admitting: Pulmonary Disease

## 2011-05-10 ENCOUNTER — Telehealth: Payer: Self-pay | Admitting: Pulmonary Disease

## 2011-05-10 MED ORDER — AMOXICILLIN-POT CLAVULANATE 875-125 MG PO TABS: 1.0000 | ORAL_TABLET | Freq: Two times a day (BID) | ORAL | Status: AC

## 2011-05-10 NOTE — Telephone Encounter (Signed)
I spoke with pt and she c/o PND, sneezing, nasal congestion, cough w/ clear phlem, watery/itchy eyes, sinus pressure, little sore throat x 2 days. Denies any f/c/s/n/v. She is requesting an rx be called in for her. Please advise Dr. Lenna Gilford, thanks  No Known Allergies   [email or web address]

## 2011-05-10 NOTE — Telephone Encounter (Signed)
Per SN---augmentin 826m  #14  1 po bid until gone.  thanks

## 2011-05-10 NOTE — Telephone Encounter (Signed)
I spoke with pt and is aware of SN recs. rx has been sent to the pharmacy. Nothing further was needed

## 2011-05-18 ENCOUNTER — Other Ambulatory Visit: Payer: Self-pay | Admitting: Pulmonary Disease

## 2011-06-21 ENCOUNTER — Other Ambulatory Visit: Payer: Self-pay | Admitting: Pulmonary Disease

## 2011-07-31 ENCOUNTER — Other Ambulatory Visit: Payer: Self-pay | Admitting: Pulmonary Disease

## 2011-08-01 ENCOUNTER — Telehealth: Payer: Self-pay | Admitting: Pulmonary Disease

## 2011-08-01 MED ORDER — HYDROCODONE-ACETAMINOPHEN 5-500 MG PO TABS: 1.0000 | ORAL_TABLET | Freq: Four times a day (QID) | ORAL | Status: DC | PRN

## 2011-08-01 MED ORDER — ALPRAZOLAM 0.5 MG PO TABS: ORAL_TABLET | ORAL | Status: DC

## 2011-08-01 NOTE — Telephone Encounter (Signed)
Pt returned call.  States she needs rxs for xanax and vicodin.   Last OV with Dr. Lenna Gilford 01/25/11 - asked to f/u in 6 months No pending OV Xanax rx last given 01/25/11 # 120 x 5 Vicodin Rx last given 01/25/11 #120 x 5  Dr. Lenna Gilford, pls advise if rxs are ok.  Thank you  Tana Coast

## 2011-08-01 NOTE — Telephone Encounter (Signed)
Per SN---pt will need ov in the next month for follow up.  Ok to give refill of the alprazolam and vicodin to last for 1 month.   No further refills.  thanks

## 2011-08-01 NOTE — Telephone Encounter (Signed)
lmomtcb x1 

## 2011-08-01 NOTE — Telephone Encounter (Signed)
Ok to offer July 11 at [facility]

## 2011-08-01 NOTE — Telephone Encounter (Signed)
Leigh, Dr. Lenna Gilford does not have any openings in the next month.  Pls advise if pt can be worked in somewhere or if he would like her to see TP.  Thank you.

## 2011-08-01 NOTE — Telephone Encounter (Signed)
Called, spoke with pt.  She is aware we have called in xanax and vicodin for a 1 month supply to [facility]. but will need OV for additional refills.  We have scheduled her to see Dr. Lenna Gilford on July 11 at 4 pm.  She verbalized understanding of this and voiced no further questions/concerns at this time.

## 2011-08-17 ENCOUNTER — Encounter: Payer: Self-pay | Admitting: Pulmonary Disease

## 2011-08-17 ENCOUNTER — Ambulatory Visit (INDEPENDENT_AMBULATORY_CARE_PROVIDER_SITE_OTHER): Payer: BC Managed Care – PPO | Admitting: Pulmonary Disease

## 2011-08-17 VITALS — BP 128/78 | HR 70 | Temp 97.0°F | Ht 67.0 in | Wt 189.0 lb

## 2011-08-17 DIAGNOSIS — Z23 Encounter for immunization: Secondary | ICD-10-CM

## 2011-08-17 DIAGNOSIS — IMO0001 Reserved for inherently not codable concepts without codable children: Secondary | ICD-10-CM

## 2011-08-17 DIAGNOSIS — F3289 Other specified depressive episodes: Secondary | ICD-10-CM

## 2011-08-17 DIAGNOSIS — R1310 Dysphagia, unspecified: Secondary | ICD-10-CM

## 2011-08-17 DIAGNOSIS — E7800 Pure hypercholesterolemia, unspecified: Secondary | ICD-10-CM

## 2011-08-17 DIAGNOSIS — R490 Dysphonia: Secondary | ICD-10-CM | POA: Diagnosis not present

## 2011-08-17 DIAGNOSIS — K219 Gastro-esophageal reflux disease without esophagitis: Secondary | ICD-10-CM

## 2011-08-17 DIAGNOSIS — F329 Major depressive disorder, single episode, unspecified: Secondary | ICD-10-CM

## 2011-08-17 DIAGNOSIS — E039 Hypothyroidism, unspecified: Secondary | ICD-10-CM

## 2011-08-17 DIAGNOSIS — F17200 Nicotine dependence, unspecified, uncomplicated: Secondary | ICD-10-CM

## 2011-08-17 DIAGNOSIS — M26629 Arthralgia of temporomandibular joint, unspecified side: Secondary | ICD-10-CM | POA: Diagnosis not present

## 2011-08-17 DIAGNOSIS — K589 Irritable bowel syndrome without diarrhea: Secondary | ICD-10-CM

## 2011-08-17 DIAGNOSIS — F411 Generalized anxiety disorder: Secondary | ICD-10-CM

## 2011-08-17 DIAGNOSIS — R51 Headache: Secondary | ICD-10-CM

## 2011-08-17 DIAGNOSIS — G4761 Periodic limb movement disorder: Secondary | ICD-10-CM

## 2011-08-17 MED ORDER — HYDROCODONE-ACETAMINOPHEN 5-500 MG PO TABS: 1.0000 | ORAL_TABLET | Freq: Four times a day (QID) | ORAL | Status: DC | PRN

## 2011-08-17 MED ORDER — TRAMADOL HCL 50 MG PO TABS
50.0000 mg | ORAL_TABLET | Freq: Three times a day (TID) | ORAL | Status: DC | PRN
Start: 2011-08-17 — End: 2012-02-27

## 2011-08-17 MED ORDER — DEXLANSOPRAZOLE 60 MG PO CPDR: 60.0000 mg | DELAYED_RELEASE_CAPSULE | Freq: Every day | ORAL | Status: DC

## 2011-08-17 MED ORDER — FLUOCINONIDE-E 0.05 % EX CREA: TOPICAL_CREAM | CUTANEOUS | Status: DC

## 2011-08-17 MED ORDER — EZETIMIBE 10 MG PO TABS: 10.0000 mg | ORAL_TABLET | Freq: Every day | ORAL | Status: DC

## 2011-08-17 MED ORDER — ALPRAZOLAM 0.5 MG PO TABS: ORAL_TABLET | ORAL | Status: DC

## 2011-08-17 MED ORDER — FENOFIBRATE 54 MG PO TABS: 54.0000 mg | ORAL_TABLET | Freq: Every day | ORAL | Status: DC

## 2011-08-17 MED ORDER — LEVOTHYROXINE SODIUM 150 MCG PO TABS: 150.0000 ug | ORAL_TABLET | Freq: Every day | ORAL | Status: DC

## 2011-08-17 MED ORDER — PRAMIPEXOLE DIHYDROCHLORIDE 0.25 MG PO TABS: ORAL_TABLET | ORAL | Status: DC

## 2011-08-17 MED ORDER — CITALOPRAM HYDROBROMIDE 40 MG PO TABS: 40.0000 mg | ORAL_TABLET | Freq: Every day | ORAL | Status: DC

## 2011-08-17 MED ORDER — BUPROPION HCL ER (XL) 300 MG PO TB24: 300.0000 mg | ORAL_TABLET | Freq: Every day | ORAL | Status: DC

## 2011-08-17 NOTE — Patient Instructions (Addendum)
Today we updated your med list in our EPIC system...    Continue your current medications the same...    We refilled your meds per request...  We wrote a new prescription for generic LIDEX-E cream to use on the bites...  We decided to try the ZETIA for the cholesterol one tab daily (try the samples first)...    If tolerated- then let's plan a follow up FASTING lipid profile in about 2 months time...    If you develop any side effects- give [place] a call so we can document the effects on your system...  We gave you the TDAP combination tetanus & pertussis vaccine today...  Congrats on the twin grandbabies!!!  We will arrange for a Rheumatology follow up eval w/ DrTruslow...  Call for any questions...  Let's continue our 6 month follow up visits.Marland KitchenMarland Kitchen

## 2011-08-17 NOTE — Progress Notes (Signed)
Subjective:    Patient ID: Tasha [name], female    DOB: 1960-02-20, 51 y.o.   MRN: [id number]  HPI 51 y/o WF here for a follow up visit... she has multiple medical problems including Asp Pneumonia from LER;  Cigarette smoker;  ?MVP w/ CP & Palpit;  Severe Hypercholesterolemia;  Hypothyroidism;  GERD & Dysphagia w/ LER & asp pneum;  IBS/ Colon polyps;  Abn LFTs from hepatic steatosis;  Severe FM;  Chr headaches;  RLS;  Anxiety & Depression...   ~  April 08, 2010:  She has hx chronic GI problems including GERD, Dysphagia, & IBS (which she notes is one of her chr disability diagnoses)... recently she has noted incr dysphagia & globus phenomenon ("lump in my throat & feeling like I'm swallowing golf balls") & she wanted referral to ENT- DrRosen (we don't have his consult note)... she was referred by DrGessner to [facility] & seen 03/07/10- his note is reviewed (pt indicates she was told that her swallowing difficulty was related to her nerves)... they indicated that they were going to do CT Neck (due to the globus sensation), Ba Swallow, refer to ENT- DrBuckmire for eval hoarseness, & rx w/ antireflux regimen & continued PPI meds (CT report, MBS, & ENT notes ===> all pending) ... DrMadanick called to tell me that the CT neck showed mediastinal adenopathy & she needed dedicated CT Chest & pulm f/u here... [name] says DrBuckmire found 11-12 polyps on her cords (we do not have any of his notes) & rec speech therapy.    She is a 1ppd smoker x30+yrs... last CXR 9/10 was clear, NAD... 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 in this regard she is certainly a set-up for aspiration> current CXR shows patchy right mid lung opac & CT Chest today revealed mild centrilob emphysema,  focal airsp dis RUL, ?reactive adenopathy vs other etiology, & hepatic steatosis >> we discussed these findings & rec treatment for prob asp pneumonia w/ short term follow up & eventual repeat CT later...  ~  April 28, 2010:  She is s/p Augmentin & Pred taper w/ Mucinex/ Tussionex/ etc;  Clinically improved w/ decr cough/ phlegm/ dyspnea/ etc;  CXR today is similarly improved w/ resolution of the right sided pneumonia;  She still has mult somatic complaints w/ cough, beige sput, no hemoptysis, +dyspnea, +left post CWP, etc;  We reviewed her critically important antireflux regimen w/ elev HOB 6", NPO after dinner, continue Dexilant bid, etc;  She has cut back smoking to <1/2 ppd & she is asking for Chantix (Rx written);  She is going to proceed w/ the speech path training at [facility] & f/u w/ ENT DrBuckmire regarding her VC polyps;  We will recheck pt w/ CXR in 6weeks...  ~  Jun 10, 2010:   6 week ROV & she reports about the same> mult somatic complaints, very anxious about the prev CT showing mild centilob emphysema (the main findings were asp pneumonitis & reactive adenopathy);  She tells me she filled the Chantix Rx but hasn't taken it (scared of the side effects), still smoking but <1/2ppd now;  Prev CXR 04/28/10 showed resolution of the infiltrates, both lungs were clear, & she was reassured...  In the interval  she reports accompanying a friend w/ abd pain to the ER & mentioned to the staff that she had a cough & noted a sm amt of blood tinged mucus (it was actually a bloody nasal discharge & sm amt of blood in mouth); they did another CXR dated 06/01/10 & this too was clear & WNL; but the ER eval was very stressful...  The biggest concern at present is the continued globus phenomenon, difficulty swallowing & occas nocturnal regurg into her throat (obviously the source of her prev aspiration episode)> she notes that this occurs once or twice weekly despite her maximum antireflux regimen etc...  We reviewed the  antireflux recommendations; she requests increase Dexilant to Bid- OK; she requests follow up appt w/ DrMadanick at [facility] & we will set this up for her; finally she wants f/u CT Chest since being told she has emphysema scared her, & we want it to follow up on the prev noted adenopathy (CT Chest 5/12 showed resolving infiltrate, decr adenopathy, NAD- she was pleased there was no mention of emphysema)...  ~  January 25, 2011:  232mo ROV & she is c/o pain all over & she wants refills on the "absolute max Vicodin & Xanax (both Qid= 4/d)... She notes lots of HAs, LBP, legs ache & feet hurt all the time; lots of stress as daugh is preg w/ twins...  She is still smoking 1/2ppd & declines smoking cessation counseling;  She denies cough, sputum or SOB; states her swallowing is good w/o reflux or choking; weight is up 8# to 191# today despite attempts at diet/ exercise/ etc; she stopped the Zetia & Fenofib from the lipid clinic ?why- she thought it made her gain wt- & is encouraged to f/u w/ them; still on Dexilant Bid w/ globus phenomenon & she never went back to see DrMadanick at [facility]...  We discussed & I offered Rheum eval, HA clinic referral, Psychiatric referral but she declines these interventions...  ~  August 17, 2011:  232mo ROV & she is here for med refills and TDAP (new twin gradchildren at home);  She initially noted "everything's going OK" then launched into a litany of complaints> sore throat & right side of neck hurts (use MMW + pain meds/ muscle relaxers);  Joint pains, muscle aches, feet hurt, +HAs, feels tired all the time, etc (she has Vicodin, Tramadol, Mobic, & we will refer to DrTruslow for f/u rheum eval);  She found 2 ticks recently w/ little red marks on her side (Rx w/ Lidex-E cream prn)...     We reviewed in detail her Hx severe hyperlipidemia and reported intol to all meds tried; we reviewed low chol/ low fat diet & gave her a hand-out; we reviewed prev meds & was INTOL all statins due to leg pains  that occurred within several days of starting these meds- even low dose/ intermittent days/ etc; the Lipid Clinic last tried Zetia10 & [phone] but she stopped on her own due to wt gain; I have suggested referral to [facility] but she wants to try these meds here first, therefore given ZETIA 10mg /d & plan f/u FLP in 8 wks...    We reviewed prob list, meds, xrays and labs> see below for updates>>         Problem List:  Hx of TMJ PAIN> GLOBUS Phenomenon believed related to GERD> OROPHARYNGEAL DYSPHAGIA> ~  9/10: ENT eval by Darlin Priestly (referred by DrSMiller)- Globus likely due to reflux, continue PPI Bid, but she only  takes intermittently... ~  1/12:  Pt requested ENT second opinion w/ DrRosen- referred> we don't have copies of his eval... ~  1/12 & 2/12:  Eval at [facility] by GI-DrMadanick & ENT-DrBuckmire> pt reports they found polyps on her vocal cords.  CIGARETTE SMOKER> she continues to smoke cigarettes daily; on MUCINEX 1-2Bid w/ fluids. ASPIRATION PNEUMONIA> this occurred 2/12 & was referred to [place] for f/u from [facility]... Mild Centrilobular Emphysema> noted on CT Chest 3/12... ~  CXR 9/10 showed norm heart size, clear lungs, NAD.Marland Kitchen. ~  CXR 2/12 showed patchy opacity right mid lung/ RML... ~  CT Chest 3/12 showed RUL airsp dis & thoracic adenopathy ?reactive, mild centilob emphysema, hep steatosis.Marland Kitchen. ~  f/u CXR 3/12 & 4/12> resolution of the right sided infiltrate, clear, WNL.Marland Kitchen. ~  f/u CT Chest 5/12 showed resolution of prev airsp dis, no suspicious nodules or masses, decreased size of nodes, NAD.Marland KitchenMarland Kitchen  ? of MITRAL VALVE PROLAPSE (ICD-424.0) - followed by Walker Kehr for Cards... she has intermittent CP (likely related to her FM), and palpitations that continue to be a problem for her- advised to quit nicotine & caffeine...  ~  2DEcho 10/06 was normal without MVP seen, no MR, norm LVF... prev 2D in 1990 w/ mild bowing of ant leaflet c/w mild MVP at that time... ~  NuclearStressTest 2/02  was WNL- no ischemia, no infarction, EF=73%... ~  Cardiac eval 9/10 by Walker Kehr for atyp CP- NuclearStressTest was normal- EF=70%...  HYPERCHOLESTEROLEMIA (ICD-272.0) - hx of Chol values >300 in the past (esp when thyroid was underactive)... she has been intolerant to all statin meds and has been managed by the Lipid Clinic> currently on diet + Zetia & Fenofibrate==> she stopped these on her own... ~  FLP 7/09 showed TChol 301, TG 284, HDL 32, LDL 210... referred to LipidClinic, tried on Crestor5 but she stopped due to leg pain. ~  FLP 7/10 showed TChol 318, TG 318, HDL 32, LDL 228... given Zocor20, but stopped this on her own... rec> return to LipidClinic. ~  FLP 1/11 showed TChol 283, TG 271, HDL 32, LDL 212... she reports trial Lipitor but intol w/ leg pain... ~  5/11:  we discussed trial of Livalo 2mg  samples start 1/2 tab Qhs... she reports stopping this med after 2 wks c/o leg pains & she is not willing to continue statin Rx ==> rec return to Lipid Clinic. ~  2/12: she reports back on diet + Zetia/ Fenofibrate per [facility] staff (note: TChol in 2011= 283-310 range). ~  Followed via the Lipid Clinic> she has been intermittent about filling her Rx & stopped Zetia/ Fenofib160 due to wt gain she says... ~  7/13:  Offered referral to [facility] for lipid Management but she wants to try ZETIA 10mg /d monotherapy to check tolerability & efficacy...  HYPOTHYROIDISM (ICD-244.9) - on SYNTHROID 130mcg/d... hypothyroidism initially diagnosed in the 1980's and she's been on meds ever since then... ~  labs 12/08 showed TSH= 31 ?off her meds... rec to restart her Synthroid regularly... ~  labs 7/09 showed TSH= 2.24 ~  labs 3/10 showed TSH= 0.95 ~  labs 7/10 showed TSH= 4.22 ~  labs 1/11 showed TSH= 10.95... pt reminded to take Synthroid dose 19mcg/d regularly. ~  3/11: pt saw DrEllison for consult & Synthroid incr to 136mcg/d but she "didn't feel right" on this & ?ret to 123mcg/d. ~  2/12:  labs showed TSH=29.4 & she  states taking med daily but confirmed w/ pharm that she's been out for 2weeks!  GERD (ICD-530.81) & DYSPHAGIA UNSPECIFIED (ICD-787.20) - prev GI eval by DrSamLeBauer- now sees DrGessner & was advised to quit smoking & caffeine, +given Aciphex & Carafate but she stopped both meds and used Tums/ Rolaids/ Maalox etc... now she notes best response to [facility] 60mg  1-2 daily... ~  EGD 10/10 by DrGessner showed severe gastritis, norm esoph, norm duodenum, neg HPylori. ~  pt referred to [facility] by DrGessner but cancelled when all her symptoms improved on DEXILANT 60mg /d. ~  1/12:  pt requested referral to [facility] for her GERD, dysphagia, LER, Globus> seen by [facility] & his note is reviewed... ~  5/12:  Pt notes continued globus sensation making swallowing difficult & nocturnal regurgitation into her throat occuring 1-2 per week; referred back to [facility] (she never went).  IBS (ICD-564.1) - colonoscopy 3/93 by DrSam was WNL...symptoms were felt to be IBS... ~  CTAbd 8/06 showed fatty liver, neg abd, s/p hyst, left ovarian cyst, norm right ovary... ~  Colonoscopy 3/10 by drGessner showed 7mm hyperplastic polyp, hems, otherw neg... f/u planned 56yrs. ~  5/10:  f/u DrGessner w/ IBS/ gas/ alt bowel habits- given ALIGN & BENTYL...  LIVER FUNCTION TESTS, ABNORMAL (ICD-794.8) - she had second GI opinion and eval of AbnLFT's by DrBuccini in 2002... he felt she had Fatty Liver Disease... she's been counselled to lose weight/ low fat diet/ incr exercise... ~  labs 7/09 showed SGOT= 45, SGPT= 65 ~  labs 3/10 showed SGOT= 48, SGPT= 64 ~  labs 7/10 showed SGOT= 59, SGPT= 77 ~  labs 1/11 showed SGOT= 42, SGPT= 50 ~  labs 2/12 showed SGOT= 34, SGPT= 36; CT Chest showed hepatic steatosis...  FIBROMYALGIA (ICD-729.1) - over the years she has seen DrTruslow, DrZeminski, & DrDeveshwar for Rheum opinions... she has also been treated by DrWillen/chiropractor... she has tried Flexeril, DCN100, Vicodin, Mobic, Skelaxin... ~  12/12:   she requests refill of VICODIN up to 4/d prn, TRAMADOL 50mg Qid prn, MOBIC 15mg /d... ~  7/13:  She wants 75mo refills on her VICODIN, TRAMADOL, MOBIC...  HEADACHE, CHRONIC (ICD-784.0) - she has been eval by Dr Meryl Crutch & DrWeymann for Neurology- they rec the Wellbutrin & Celexa... prev Neuro [place] in [place] 1[place] w/ DrHaworth for c/o numbness & tingling- (of note he recorded that her ROS was pos for every question that he asked her)... he rec MRI Brain, NCV's, and extensive lab work- we never received a follow up note after the initial visit... prev MRI from Neuro in [place] was WNL in the 1990's... ~  9/10: eval by DrReynolds- he thought Migraines & Rx w/ Maxalt... ~  6/11:  f/u Neuro w/ DrReynolds- continue Maxalt & add Nortriptyline 25mg  Qhs prophylaxis. ~  7/13:  She uses the Vicodin & Tramadol for the HAs...  PERIODIC LIMB MOVEMENT DISORDER (ICD-327.51) - she had a prev sleep study 1/04 w/ signif periodic limb movements (leg jerks) w/ sleep disruption (study showed RDI= 1, but PLM index was 11- mostly arousals)... tried on MIRAPEX 0.25mg - 2 Qhs but she didn't continue> asked for refill Rx 11/11 & thereafter...  ANXIETY (ICD-300.00) - on ALPRAZOLAM 0.5mg Qid- "it's the only thing that helps". DEPRESSION (ICD-311) - on WELLBUTRIN XL 300mg /d,  & CELEXA 40mg /d. ~  3/12:  she hasn't seen psychiatry in >10 yrs she says & has severe anxiety issues affecting her medical condition... rec referral back to DrMcKinney et al for their help in managing her problem.   Past Surgical History  Procedure Date  . Tonsillectomy   .  Wrist surgery   . Arm surgery   . Foot surgery     Outpatient Encounter Prescriptions as of 08/17/2011  Medication Sig Dispense Refill  . ALPRAZolam (XANAX) 0.5 MG tablet As needed four times a day not to exceed 4 tablets per day and no early refills  120 tablet  5  . buPROPion (WELLBUTRIN XL) 300 MG 24 hr tablet Take 1 tablet (300 mg total) by mouth daily.  30 tablet  6  . citalopram  (CELEXA) 40 MG tablet Take 1 tablet (40 mg total) by mouth daily.  30 tablet  6  . dexlansoprazole (DEXILANT) 60 MG capsule Take 1 capsule (60 mg total) by mouth daily.  60 capsule  5  . guaiFENesin (MUCINEX) 600 MG 12 hr tablet Take 1,200 mg by mouth 2 (two) times daily.        Marland Kitchen HYDROcodone-acetaminophen (VICODIN) 5-500 MG per tablet Take 1 tablet by mouth every 6 (six) hours as needed for pain (NOT TO EXCEED 4 PER DAY). For pain  120 tablet  5  . levothyroxine (SYNTHROID, LEVOTHROID) 150 MCG tablet Take 1 tablet (150 mcg total) by mouth daily.  30 tablet  6  . meloxicam (MOBIC) 15 MG tablet TAKE ONE TABLET BY MOUTH EVERY DAY AS NEEDED FOR JOINT PAIN  30 tablet  6  . pramipexole (MIRAPEX) 0.25 MG tablet 2 tablets by mouth at bedtime  60 tablet  5  . traMADol (ULTRAM) 50 MG tablet Take 1 tablet (50 mg total) by mouth every 8 (eight) hours as needed for pain.  90 tablet  5  . ezetimibe (ZETIA) 10 MG tablet Take 1 tablet (10 mg total) by mouth daily.  30 tablet  11  . fenofibrate 54 MG tablet Take 1 tablet (54 mg total) by mouth daily.  30 tablet  6  . fluocinonide-emollient (LIDEX-E) 0.05 % cream Apply to rash two times daily  30 g  2    No Known Allergies   Current Medications, Allergies, Past Medical History, Past Surgical History, Family History, and Social History were reviewed in [employer] record.    Review of Systems  Constitutional: Positive for fatigue. Negative for fever, chills and unexpected weight change.  HENT: Positive for sore throat, trouble swallowing and voice change. Negative for ear pain, congestion, rhinorrhea, sneezing and postnasal drip.   Respiratory: Positive for cough, choking and shortness of breath. Negative for chest tightness and wheezing.   Cardiovascular: Positive for chest pain and palpitations. Negative for leg swelling.  Gastrointestinal: Negative for nausea, vomiting, abdominal pain, diarrhea and constipation.  Genitourinary:  Negative for dysuria, urgency, frequency, hematuria and flank pain.  Musculoskeletal: Positive for myalgias and arthralgias.  Skin: Negative for rash.  Neurological: Positive for weakness and headaches. Negative for dizziness, tremors, seizures, speech difficulty and light-headedness.  Hematological: Negative for adenopathy. Does not bruise/bleed easily.  Psychiatric/Behavioral: Positive for disturbed wake/sleep cycle. Negative for hallucinations and confusion. The patient is nervous/anxious.      Objective:   Physical Exam  Vitals reviewed. Constitutional: She is oriented to person, place, and time. She appears well-developed and well-nourished.  HENT:  Head: Normocephalic and atraumatic.  Right Ear: External ear normal.  Left Ear: External ear normal.  Mouth/Throat: Oropharynx is clear and moist. No oropharyngeal exudate.  Eyes: Conjunctivae and EOM are normal. Pupils are equal, round, and reactive to light.  Neck: Neck supple. No JVD present. No thyromegaly present.  Cardiovascular: Normal rate, regular rhythm, normal heart sounds and intact  distal pulses.   Pulmonary/Chest: Effort normal and breath sounds normal. She has no wheezes. She has no rales. She exhibits tenderness.  Abdominal: Soft. Bowel sounds are normal. She exhibits no distension. There is no tenderness.  Musculoskeletal: Normal range of motion. She exhibits tenderness. She exhibits no edema.  Lymphadenopathy:    She has no cervical adenopathy.  Neurological: She is alert and oriented to person, place, and time. She has normal reflexes. No cranial nerve deficit.  Skin: Skin is warm and dry. No rash noted.  Psychiatric: Her mood appears anxious. Her speech is rapid and/or pressured. She is agitated.    RADIOLOGY DATA:  Reviewed in the EPIC EMR & discussed w/ the patient...  LABORATORY DATA:  Reviewed in the EPIC EMR & discussed w/ the patient...    Assessment & Plan:    Cig Smoker & CT Chest w/ early  centrilobular emphysema>  This info really scared her but she is still smoking (now down to <1/2ppd she says); she filled the Chantix but is too scared to take it, afraid of the side effects (we reviewed the known side effects of smoking!)... NOTE> on repeat CT Chest the radiologist did not call emphysema & she is very pleased w/ this...  Hx ASPIRATION PNEUMONITIS>  Prev episode resolved, lungs are clear, but she reports continued nocturnal regurg into her throat putting her at high risk for repeat asp episodes;  Rec return to GI for their review & active management of this difficult problem... Prev eval w/ CT Chest showed asp pneumonia & ?reactive adenopathy> repeat CT to re-eval the nodes showed resolved RUL infiltrate & decr size of nodes...  Hoarseness/ Globus/ ?VC polyps reported by the pt>  She will f/u at [facility] w/ GI-DrMadanick, and ENT-DrBuckmire...  GERD & Nocturnal regurg into the throat>  She will keep the Dexilant to Bid & f/u w/ GI to see what else they might recommend for this difficult problem...  HYPERCHOLESTEROLEMIA>  Continue diet + off meds from the Lipid Clinic (prev Zetia & Fenofibrate); offered referral to specialists in lipid disorders at [facility] but she wants to try ZETIA monotherapy first- ok...  Hypothyroid>  Continue Synthroid daily & f/u w/ DrEllison for Endocrine...  Fibromyalgia>  She continues to depend upon Vicodin daily, I have encouraged her to follow up w/ Rheumatology or consider Pain Clinic referral...  Anxiety>  She remains on Wellbutrin, Celexa, Xanax; she was encouraged to follow up w/ her prev Psychiatrist to help w/ her nerves...   Patient's Medications  New Prescriptions   FLUOCINONIDE-EMOLLIENT (LIDEX-E) 0.05 % CREAM    Apply to rash two times daily  Previous Medications   GUAIFENESIN (MUCINEX) 600 MG 12 HR TABLET    Take 1,200 mg by mouth 2 (two) times daily.     MELOXICAM (MOBIC) 15 MG TABLET    TAKE ONE TABLET BY MOUTH EVERY DAY AS NEEDED FOR JOINT  PAIN  Modified Medications   Modified Medication Previous Medication   ALPRAZOLAM (XANAX) 0.5 MG TABLET ALPRAZolam (XANAX) 0.5 MG tablet      As needed four times a day not to exceed 4 tablets per day and no early refills    As needed four times a day not to exceed 4 tablets per day and no early refills   BUPROPION (WELLBUTRIN XL) 300 MG 24 HR TABLET buPROPion (WELLBUTRIN XL) 300 MG 24 hr tablet      Take 1 tablet (300 mg total) by mouth daily.    TAKE ONE TABLET  BY MOUTH EVERY DAY   CITALOPRAM (CELEXA) 40 MG TABLET citalopram (CELEXA) 40 MG tablet      Take 1 tablet (40 mg total) by mouth daily.    TAKE ONE TABLET BY MOUTH EVERY DAY   DEXLANSOPRAZOLE (DEXILANT) 60 MG CAPSULE DEXILANT 60 MG capsule      Take 1 capsule (60 mg total) by mouth daily.    TAKE ONE CAPSULE BY MOUTH TWICE DAILY 30 MINTUES BEFORE MEALS   EZETIMIBE (ZETIA) 10 MG TABLET ezetimibe (ZETIA) 10 MG tablet      Take 1 tablet (10 mg total) by mouth daily.    Take 10 mg by mouth daily.     FENOFIBRATE 54 MG TABLET fenofibrate 54 MG tablet      Take 1 tablet (54 mg total) by mouth daily.    Take 54 mg by mouth daily.     HYDROCODONE-ACETAMINOPHEN (VICODIN) 5-500 MG PER TABLET HYDROcodone-acetaminophen (VICODIN) 5-500 MG per tablet      Take 1 tablet by mouth every 6 (six) hours as needed for pain (NOT TO EXCEED 4 PER DAY). For pain    Take 1 tablet by mouth every 6 (six) hours as needed for pain (NOT TO EXCEED 4 PER DAY). For pain   LEVOTHYROXINE (SYNTHROID, LEVOTHROID) 150 MCG TABLET levothyroxine (SYNTHROID, LEVOTHROID) 150 MCG tablet      Take 1 tablet (150 mcg total) by mouth daily.    TAKE ONE TABLET BY MOUTH EVERY DAY   PRAMIPEXOLE (MIRAPEX) 0.25 MG TABLET pramipexole (MIRAPEX) 0.25 MG tablet      2 tablets by mouth at bedtime    2 tablets by mouth at bedtime    TRAMADOL (ULTRAM) 50 MG TABLET traMADol (ULTRAM) 50 MG tablet      Take 1 tablet (50 mg total) by mouth every 8 (eight) hours as needed for pain.    Take 1 tablet  (50 mg total) by mouth every 8 (eight) hours as needed for pain.  Discontinued Medications   BUPROPION (WELLBUTRIN XL) 300 MG 24 HR TABLET    TAKE ONE TABLET BY MOUTH EVERY DAY   FLUCONAZOLE (DIFLUCAN) 100 MG TABLET    Take 2 today, then 1 daily until gone   PREDNISONE (DELTASONE) 20 MG TABLET    Take 2 each day for 2 days, then 1 each day for 2 days,then 1/2 each day for 2 days, then stop

## 2011-11-07 ENCOUNTER — Telehealth: Payer: Self-pay | Admitting: Pulmonary Disease

## 2011-11-07 NOTE — Telephone Encounter (Signed)
Called and lmomtcb for the pt.   

## 2011-11-08 NOTE — Telephone Encounter (Signed)
Per SN----  1.  Elevate leg 2.  Ice for 2-3 days then use warm compresses 3.  rx for pain---use the vicodin and the tramadol for pain  A fall can trigger her FM so she will need to be careful.  thanks

## 2011-11-08 NOTE — Telephone Encounter (Signed)
I spoke with pt and is aware of SN recs. She voiced her understanding and needed nothing further 

## 2011-11-08 NOTE — Telephone Encounter (Signed)
Pt returned my call and stated that she fell yesterday and not sure if she just lost her balance or if she blacked out.  Pt stated that she fell on something and that her leg from her shin to her knee is swollen and today noticed a knot on her leg.  She did elevate and ice the leg last night and stated that it does not hurt as bad today and she is able to walk on the leg.  Wanted to know what SN recs are.  Please advise. Thanks   No Known Allergies

## 2011-11-08 NOTE — Telephone Encounter (Signed)
Called and lmomtcb for the pt.   

## 2012-02-01 ENCOUNTER — Other Ambulatory Visit: Payer: Self-pay | Admitting: Pulmonary Disease

## 2012-02-01 NOTE — Telephone Encounter (Signed)
This should be able to wait until Dr Kriste Basque returns to decide.

## 2012-02-01 NOTE — Telephone Encounter (Signed)
Per pharmacy they no longer make vicodin 5/500 <>take 1 tablet every 6 hours prn pain (do not exceed 4 tablets in one day).requesting a new strength. Either 5/325 or 5/300. Last fill 12-19-11 #120 has ov scheduled 02-27-12 Dr Kriste Basque is out of office until dec 30th. Dr Maple Hudson please advise Thank you No Known Allergies

## 2012-02-05 MED ORDER — HYDROCODONE-ACETAMINOPHEN 5-325 MG PO TABS: 1.0000 | ORAL_TABLET | Freq: Four times a day (QID) | ORAL | Status: DC | PRN

## 2012-02-05 NOTE — Telephone Encounter (Signed)
rx called in pharmacy. 

## 2012-02-05 NOTE — Telephone Encounter (Signed)
Per sn ok to change to vicodin 5/325 1 every six hrs prn pain  # 120  Not to exceed 4 per day with 2 refills

## 2012-02-27 ENCOUNTER — Ambulatory Visit (INDEPENDENT_AMBULATORY_CARE_PROVIDER_SITE_OTHER)
Admission: RE | Admit: 2012-02-27 | Discharge: 2012-02-27 | Disposition: A | Discharge status: Home / Self Care | Payer: BC Managed Care – PPO | Source: Ambulatory Visit | Attending: Pulmonary Disease | Admitting: Pulmonary Disease

## 2012-02-27 ENCOUNTER — Encounter: Payer: Self-pay | Admitting: Pulmonary Disease

## 2012-02-27 ENCOUNTER — Ambulatory Visit (INDEPENDENT_AMBULATORY_CARE_PROVIDER_SITE_OTHER): Payer: BC Managed Care – PPO | Admitting: Pulmonary Disease

## 2012-02-27 VITALS — BP 128/84 | HR 60 | Temp 97.4°F | Ht 67.0 in | Wt 191.6 lb

## 2012-02-27 DIAGNOSIS — IMO0001 Reserved for inherently not codable concepts without codable children: Secondary | ICD-10-CM

## 2012-02-27 DIAGNOSIS — E7800 Pure hypercholesterolemia, unspecified: Secondary | ICD-10-CM | POA: Diagnosis not present

## 2012-02-27 DIAGNOSIS — G4761 Periodic limb movement disorder: Secondary | ICD-10-CM

## 2012-02-27 DIAGNOSIS — F3289 Other specified depressive episodes: Secondary | ICD-10-CM

## 2012-02-27 DIAGNOSIS — R1310 Dysphagia, unspecified: Secondary | ICD-10-CM

## 2012-02-27 DIAGNOSIS — F17200 Nicotine dependence, unspecified, uncomplicated: Secondary | ICD-10-CM | POA: Diagnosis not present

## 2012-02-27 DIAGNOSIS — K219 Gastro-esophageal reflux disease without esophagitis: Secondary | ICD-10-CM | POA: Diagnosis not present

## 2012-02-27 DIAGNOSIS — K589 Irritable bowel syndrome without diarrhea: Secondary | ICD-10-CM

## 2012-02-27 DIAGNOSIS — E039 Hypothyroidism, unspecified: Secondary | ICD-10-CM | POA: Diagnosis not present

## 2012-02-27 DIAGNOSIS — Z23 Encounter for immunization: Secondary | ICD-10-CM | POA: Diagnosis not present

## 2012-02-27 DIAGNOSIS — E559 Vitamin D deficiency, unspecified: Secondary | ICD-10-CM

## 2012-02-27 DIAGNOSIS — R079 Chest pain, unspecified: Secondary | ICD-10-CM | POA: Diagnosis not present

## 2012-02-27 DIAGNOSIS — F329 Major depressive disorder, single episode, unspecified: Secondary | ICD-10-CM

## 2012-02-27 DIAGNOSIS — R51 Headache: Secondary | ICD-10-CM

## 2012-02-27 DIAGNOSIS — F411 Generalized anxiety disorder: Secondary | ICD-10-CM

## 2012-02-27 MED ORDER — HYDROCODONE-ACETAMINOPHEN 5-325 MG PO TABS: 1.0000 | ORAL_TABLET | Freq: Four times a day (QID) | ORAL | Status: DC | PRN

## 2012-02-27 MED ORDER — FAMOTIDINE 40 MG PO TABS: 40.0000 mg | ORAL_TABLET | Freq: Every evening | ORAL | Status: DC | PRN

## 2012-02-27 MED ORDER — BUPROPION HCL ER (XL) 300 MG PO TB24: 300.0000 mg | ORAL_TABLET | Freq: Every day | ORAL | Status: DC

## 2012-02-27 MED ORDER — TEMAZEPAM 30 MG PO CAPS: 30.0000 mg | ORAL_CAPSULE | Freq: Every evening | ORAL | Status: DC | PRN

## 2012-02-27 MED ORDER — ALPRAZOLAM 0.5 MG PO TABS: ORAL_TABLET | ORAL | Status: DC

## 2012-02-27 MED ORDER — LEVOTHYROXINE SODIUM 150 MCG PO TABS: 150.0000 ug | ORAL_TABLET | Freq: Every day | ORAL | Status: DC

## 2012-02-27 MED ORDER — DEXLANSOPRAZOLE 60 MG PO CPDR: 60.0000 mg | DELAYED_RELEASE_CAPSULE | Freq: Every day | ORAL | Status: DC

## 2012-02-27 MED ORDER — MELOXICAM 15 MG PO TABS: 15.0000 mg | ORAL_TABLET | Freq: Every day | ORAL | Status: DC

## 2012-02-27 MED ORDER — CITALOPRAM HYDROBROMIDE 40 MG PO TABS: 40.0000 mg | ORAL_TABLET | Freq: Every day | ORAL | Status: DC

## 2012-02-27 MED ORDER — MECLIZINE HCL 25 MG PO TABS: 25.0000 mg | ORAL_TABLET | ORAL | Status: DC | PRN

## 2012-02-27 MED ORDER — PRAMIPEXOLE DIHYDROCHLORIDE 0.25 MG PO TABS: ORAL_TABLET | ORAL | Status: DC

## 2012-02-27 MED ORDER — TRAMADOL HCL 50 MG PO TABS: 50.0000 mg | ORAL_TABLET | Freq: Three times a day (TID) | ORAL | Status: DC | PRN

## 2012-02-27 NOTE — Patient Instructions (Addendum)
Today we updated your med list in our EPIC system...    Continue your current medications the same...  For your reflux symtoms>    Continue the antireflux regimen> elev head of bed 6";  Do not eat or drink after dinner;  Continue the Dexilant & take PEPCID 40mg  about 1H prior to bedtime...    We will refer you back to [facility] for follow up of your GERD problem...    For GAS you may take Mylicon & Phazyme up to 4 times daily as needed...  For the dizziness we have prescribed ANTIVERT 25mg  every 4H as needed...  For Sleep we have prescribed RESTORIL 30mg  at bedtime w/ sip of water...  Today we did a follow up CXR & gave you the 2013 Flu vaccine...  Please return to our lab one morning soon for your FASTING blood work...    We will call you w/ the results when avail.Marland KitchenMarland Kitchen

## 2012-02-27 NOTE — Progress Notes (Signed)
Subjective:    Patient ID: Tasha [name], female    DOB: [birth year], 52 y.o.   MRN: [id number]  HPI 52 y/o WF here for a follow up visit... she has multiple medical problems including Asp Pneumonia from LER;  Cigarette smoker;  ?MVP w/ CP & Palpit;  Severe Hypercholesterolemia;  Hypothyroidism;  GERD & Dysphagia w/ LER & asp pneum;  IBS/ Colon polyps;  Abn LFTs from hepatic steatosis;  Severe FM;  Chr headaches;  RLS;  Anxiety & Depression...   ~  April 08, 2010:  She has hx chronic GI problems including GERD, Dysphagia, & IBS (which she notes is one of her chr disability diagnoses)... recently she has noted incr dysphagia & globus phenomenon ("lump in my throat & feeling like I'm swallowing golf balls") & she wanted referral to ENT- DrRosen (we don't have his consult note)... she was referred by DrGessner to [facility] & seen 03/07/10- his note is reviewed (pt indicates she was told that her swallowing difficulty was related to her nerves)... they indicated that they were going to do CT Neck (due to the globus sensation), Ba Swallow, refer to ENT- DrBuckmire for eval hoarseness, & rx w/ antireflux regimen & continued PPI meds (CT report, MBS, & ENT notes ===> all pending) ... DrMadanick called to tell me that the CT neck showed mediastinal adenopathy & she needed dedicated CT Chest & pulm f/u here... [name] says DrBuckmire found 11-12 polyps on her cords (we do not have any of his notes) & rec speech therapy.    She is a 1ppd smoker x30+yrs... last CXR 9/10 was clear, NAD... 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 in this regard she is certainly a set-up for aspiration> current CXR shows patchy right mid lung opac & CT Chest today revealed mild centrilob emphysema,  focal airsp dis RUL, ?reactive adenopathy vs other etiology, & hepatic steatosis >> we discussed these findings & rec treatment for prob asp pneumonia w/ short term follow up & eventual repeat CT later => f/u improved w/ resolved right sided pneumonia...  ~  Jun 10, 2010:   6 week ROV & she reports about the same> mult somatic complaints, very anxious about the prev CT showing mild centilob emphysema (the main findings were asp pneumonitis & reactive adenopathy);  She tells me she filled the Chantix Rx but hasn't taken it (scared of the side effects), still smoking but <1/2ppd now;  Prev CXR 04/28/10 showed resolution of the infiltrates, both lungs were clear, & she was reassured...  In the interval she reports accompanying a friend w/ abd pain to the ER & mentioned to the staff that she had a cough & noted a sm amt of blood tinged mucus (it was actually a bloody nasal discharge & sm amt of blood in mouth); they did another CXR dated 06/01/10 & this too was clear & WNL; but the ER eval was very stressful...  The biggest concern at present is the continued globus phenomenon, difficulty swallowing & occas nocturnal regurg into her throat (obviously the source of her prev aspiration episode)> she notes that this occurs once or twice weekly despite her maximum antireflux regimen etc...  We reviewed the antireflux recommendations; she requests increase Dexilant  to Bid- OK; she requests follow up appt w/ DrMadanick at [facility] & we will set this up for her; finally she wants f/u CT Chest since being told she has emphysema scared her, & we want it to follow up on the prev noted adenopathy (CT Chest 5/12 showed resolving infiltrate, decr adenopathy, NAD- she was pleased there was no mention of emphysema)...  ~  January 25, 2011:  15mo ROV & she is c/o pain all over & she wants refills on the "absolute max Vicodin & Xanax" (both Qid= 4/d)... She notes lots of HAs, LBP, legs ache & feet hurt all the time; lots of stress as daugh  is preg w/ twins...  She is still smoking 1/2ppd & declines smoking cessation counseling;  She denies cough, sputum or SOB; states her swallowing is good w/o reflux or choking; weight is up 8# to 191# today despite attempts at diet/ exercise/ etc; she stopped the Zetia & Fenofib from the lipid clinic ?why- she thought it made her gain wt- & is encouraged to f/u w/ them; still on Dexilant Bid w/ globus phenomenon & she never went back to see [facility]Madanick at [facility]...  We discussed & I offered Rheum eval, HA clinic referral, Psychiatric referral but she declines these interventions...  ~  August 17, 2011:  15mo ROV & she is here for med refills and TDAP (new twin gradchildren at home);  She initially noted "everything's going OK" then launched into a litany of complaints> sore throat & right side of neck hurts (use MMW + pain meds/ muscle relaxers);  Joint pains, muscle aches, feet hurt, +HAs, feels tired all the time, etc (she has Vicodin, Tramadol, Mobic, & we will refer to DrTruslow for f/u rheum eval);  She found 2 ticks recently w/ little red marks on her side (Rx w/ Lidex-E cream prn)...     We reviewed in detail her Hx severe hyperlipidemia and reported intol to all meds tried; we reviewed low chol/ low fat diet & gave her a hand-out; we reviewed prev meds & was INTOL all statins due to leg pains that occurred within several days of starting these meds- even low dose/ intermittent days/ etc; the Lipid Clinic last tried Zetia10 & [phone] but she stopped on her own due to wt gain; I have suggested referral to [facility] but she wants to try these meds here first, therefore given ZETIA 10mg /d & plan f/u FLP in 8 wks...    We reviewed prob list, meds, xrays and labs> see below for updates>>  ~  February 27, 2012:  19mo ROV & [name] has mult somatic complaints- c/o incr heartburn & reflux despite her Dexilant Bid & antireflux regimen (we discussed adding Pepcid40 Qhs & refer back to GI at [facility]);  c/o dizzy x  several weeks esp w/ movement 7 position changes c/w BPPV & we have prescribed Antivert 25mg  prn;  c/o fatigue, tired all the time, not resting well, & pain "all over" c/w her known severe FM (we discussed adding Restoril30mg  Qhs)...  We reviewed the following medical problems during today's office visit >> she did not bring meds or med list to the OV today...     ENT- Hx TMJ, Globus, LER> she has seen DrBates & Rosen in [place] & DrBuckmire at [facility]; Globus due to LER & they rec incr PPI to Bid...    Pulm- smoker, Hx asp pneum, +emphysema on CTChest> still smoking ~5cig/d she says; denies cough, sput, hemoptysis, dyspnea, CP,  etc...    Hx MVP> she has CP more related to FM than to MVP; denies palpit, ch in SOB, edema; she knows to avoid caffeine, asked to quit smoking, etc...    CHOL> on [place], [id number]; due for FLP & she will ret to our lab for this...    Hypothy> on Levothy150; states she's been taking it daily; due for f/u TSH- pending    GI- GERD, gastritis, dysphagia, IBS> on Dexilant60Bid-"I need it twice daily"; we reviewed antireflux regimen w/ elev HOB 6", NPO after dinner, add Pepcid40 Qhs...    Hx Hep steatosis> on diet Rx w/ weight 192# which is up 3#, BMI~30; we reviewed diet, exercise, wt reduction strategies...    FM> on Hydrocodone5, Mobic15, Tramadol50; we reviewed the importance of a good night's sleep- add Restoril 30mg Qhs as a trial...    Hx chronic HAs> prev eval by Drs Meryl Crutch & Orlin Hilding- they suggested the Wellbutrin & Celexa; she's tried Maxalt in the past; using Vicodin as needed...    RLS> on Mirapex0.25-2Qhs & this helps she says but she only uses it as needed...    Anxiety/ Depression> on WellbutrinXL300, Celexa40, Alpraz0.5 (states Rebeca Allegra is the only thing that helps her)...  We reviewed prob list, meds, xrays and labs> see below for updates >> OK Flu vaccine today... CXR 1/14 shows normal heart size, clear lungs, WNL.Marland KitchenMarland Kitchen LABS 1/14:  She will ret for FASTING blood work this  week=> pending          Problem List:  Hx of TMJ PAIN> GLOBUS Phenomenon believed related to GERD> "lump in my throat & feeling like I'm swallowing golf balls"  OROPHARYNGEAL DYSPHAGIA> ~  9/10: ENT eval by Darlin Priestly (referred by DrSMiller)- Globus likely due to reflux, continue PPI Bid, but she only takes intermittently... ~  1/12:  Pt requested ENT second opinion w/ DrRosen- referred> we don't have copies of his eval... ~  1/12 & 2/12:  Eval at [facility] by GI-DrMadanick & ENT-DrBuckmire> pt reports they found polyps on her vocal cords.  CIGARETTE SMOKER> she continues to smoke cigarettes daily; on MUCINEX 1-2Bid w/ fluids. ASPIRATION PNEUMONIA> this occurred 2/12 & was referred to [place] for f/u from [facility]... Mild Centrilobular Emphysema> noted on CT Chest 3/12... ~  CXR 9/10 showed norm heart size, clear lungs, NAD.Marland Kitchen. ~  CXR 2/12 showed patchy opacity right mid lung/ RML... ~  CT Chest 3/12 showed RUL airsp dis & thoracic adenopathy ?reactive, mild centilob emphysema, hep steatosis.Marland Kitchen. ~  f/u CXR 3/12 & 4/12> resolution of the right sided infiltrate, clear, WNL.Marland Kitchen. ~  f/u CT Chest 5/12 showed resolution of prev airsp dis, no suspicious nodules or masses, decreased size of nodes, NAD.Marland Kitchen. ~  f/u CXR 1/14 showed normal heart size, clear lungs, WNL.Marland KitchenMarland Kitchen  ? of MITRAL VALVE PROLAPSE (ICD-424.0) - followed by Walker Kehr for Cards... she has intermittent CP (likely related to her FM), and palpitations that continue to be a problem for her- advised to quit nicotine & caffeine...  ~  2DEcho 10/06 was normal without MVP seen, no MR, norm LVF... prev 2D in 1990 w/ mild bowing of ant leaflet c/w mild MVP at that time... ~  NuclearStressTest 2/02 was WNL- no ischemia, no infarction, EF=73%... ~  Cardiac eval 9/10 by Walker Kehr for atyp CP- NuclearStressTest was normal- EF=70%...  HYPERCHOLESTEROLEMIA (ICD-272.0) - hx of Chol values >300 in the past (esp when thyroid was underactive)... she has been intolerant  to all statin meds and has been managed by the  Lipid Clinic> currently on diet + Zetia & Fenofibrate==> she stopped these on her own... ~  FLP 7/09 showed TChol 301, TG 284, HDL 32, LDL 210... referred to LipidClinic, tried on Crestor5 but she stopped due to leg pain. ~  FLP 7/10 showed TChol 318, TG 318, HDL 32, LDL 228... given Zocor20, but stopped this on her own... rec> return to LipidClinic. ~  FLP 1/11 showed TChol 283, TG 271, HDL 32, LDL 212... she reports trial Lipitor but intol w/ leg pain... ~  5/11:  we discussed trial of Livalo 2mg  samples start 1/2 tab Qhs... she reports stopping this med after 2 wks c/o leg pains & she is not willing to continue statin Rx ==> rec return to Lipid Clinic. ~  2/12: she reports back on diet + Zetia/ Fenofibrate per [facility] staff (note: TChol in 2011= 283-310 range). ~  Followed via the Lipid Clinic> she has been intermittent about filling her Rx & stopped Zetia/ Fenofib160 due to wt gain she says... ~  7/13:  Offered referral to [facility] for lipid Management but she wants to try ZETIA 10mg /d monotherapy to check tolerability & efficacy... ~  1/14:  FLP on Zetia10+Feno54 => pending  HYPOTHYROIDISM (ICD-244.9) - on SYNTHROID 15mcg/d... hypothyroidism initially diagnosed in the 1980's and she's been on meds ever since then... ~  labs 12/08 showed TSH= 31 ?off her meds... rec to restart her Synthroid regularly... ~  labs 7/09 showed TSH= 2.24 ~  labs 3/10 showed TSH= 0.95 ~  labs 7/10 showed TSH= 4.22 ~  labs 1/11 showed TSH= 10.95... pt reminded to take Synthroid dose 128mcg/d regularly. ~  3/11: pt saw DrEllison for consult & Synthroid incr to 139mcg/d but she "didn't feel right" on this & ?ret to 115mcg/d. ~  2/12:  labs showed TSH=29.4 & she states taking med daily but confirmed w/ pharm that she's been out for 2weeks! ~  1/14:  She states she is taking her Synthroid 150 daily;  TSH= pending  GERD (ICD-530.81) & DYSPHAGIA UNSPECIFIED (ICD-787.20) - prev GI  eval by DrSamLeBauer- now sees DrGessner & was advised to quit smoking & caffeine, +given Aciphex & Carafate but she stopped both meds and used Tums/ Rolaids/ Maalox etc... now she notes best response to DEXILANT 60mg  1-2 daily... ~  EGD 10/10 by DrGessner showed severe gastritis, norm esoph, norm duodenum, neg HPylori. ~  pt referred to [facility] by DrGessner but cancelled when all her symptoms improved on DEXILANT 60mg /d. ~  1/12:  pt requested referral to [facility] for her GERD, dysphagia, LER, Globus> seen by [facility] & his note is reviewed... ~  5/12:  Pt notes continued globus sensation making swallowing difficult & nocturnal regurgitation into her throat occuring 1-2 per week; referred back to [facility] (she never went). ~  1/14:  She is c/o increased reflux symptoms despite Dexilant60Bid & antireflux regimen; we reviewed regimen- elev HOB 6", NPO after dinner, etc; add Pepcid40 Qhs, and referred back to [facility]...  IBS (ICD-564.1) - she notes that this is one of her chr disability diagnoses... ~  Colonoscopy 3/93 by DrSam was WNL...symptoms were felt to be IBS... ~  CTAbd 8/06 showed fatty liver, neg abd, s/p hyst, left ovarian cyst, norm right ovary... ~  Colonoscopy 3/10 by drGessner showed 7mm hyperplastic polyp, hems, otherw neg... f/u planned 53yrs. ~  5/10:  f/u DrGessner w/ IBS/ gas/ alt bowel habits- given ALIGN & BENTYL...  LIVER FUNCTION TESTS, ABNORMAL (ICD-794.8) - she had second GI opinion  and eval of AbnLFT's by DrBuccini in 2002... he felt she had Fatty Liver Disease... she's been counselled to lose weight/ low fat diet/ incr exercise... ~  labs 7/09 showed SGOT= 45, SGPT= 65 ~  labs 3/10 showed SGOT= 48, SGPT= 64 ~  labs 7/10 showed SGOT= 59, SGPT= 77 ~  labs 1/11 showed SGOT= 42, SGPT= 50 ~  labs 2/12 showed SGOT= 34, SGPT= 36; CT Chest showed hepatic steatosis... ~  Labs 1/14 pending  FIBROMYALGIA (ICD-729.1) - over the years she has seen DrTruslow, DrZeminski, & DrDeveshwar for Rheum  opinions... she has also been treated by DrWillen/chiropractor... she has tried Flexeril, DCN100, Vicodin, Mobic, Skelaxin... ~  12/12:  she requests refill of VICODIN up to 4/d prn, TRAMADOL 50mg Qid prn, MOBIC 15mg /d... ~  7/13:  She wants 43mo refills on her VICODIN, TRAMADOL, MOBIC...  HEADACHE, CHRONIC (ICD-784.0) - she has been eval by Dr Meryl Crutch & DrWeymann for Neurology- they rec the Wellbutrin & Celexa... prev Neuro eval in [place] 1[place] w/ DrHaworth for c/o numbness & tingling- (of note he recorded that her ROS was pos for every question that he asked her)... he rec MRI Brain, NCV's, and extensive lab work- we never received a follow up note after the initial visit... prev MRI from Neuro in [place] was WNL in the 1990's... ~  9/10: eval by DrReynolds- he thought Migraines & Rx w/ Maxalt... ~  6/11:  f/u Neuro w/ DrReynolds- continue Maxalt & add Nortriptyline 25mg  Qhs prophylaxis. ~  7/13:  She uses the Vicodin & Tramadol for the HAs...  PERIODIC LIMB MOVEMENT DISORDER (ICD-327.51) - she had a prev sleep study 1/04 w/ signif periodic limb movements (leg jerks) w/ sleep disruption (study showed RDI= 1, but PLM index was 11- mostly arousals)... tried on MIRAPEX 0.25mg - 2 Qhs but she didn't continue> asked for refill Rx 11/11 & thereafter...  ANXIETY (ICD-300.00) - on ALPRAZOLAM 0.5mg Qid- "it's the only thing that helps". DEPRESSION (ICD-311) - on WELLBUTRIN XL 300mg /d,  & CELEXA 40mg /d. ~  3/12:  she hasn't seen psychiatry in >10 yrs she says & has severe anxiety issues affecting her medical condition... rec referral back to DrMcKinney et al for their help in managing her problem.   Past Surgical History  Procedure Date  . Tonsillectomy   . Wrist surgery   . Arm surgery   . Foot surgery     Outpatient Encounter Prescriptions as of 02/27/2012  Medication Sig Dispense Refill  . ALPRAZolam (XANAX) 0.5 MG tablet As needed four times a day not to exceed 4 tablets per day and no early refills   120 tablet  5  . buPROPion (WELLBUTRIN XL) 300 MG 24 hr tablet Take 1 tablet (300 mg total) by mouth daily.  30 tablet  6  . citalopram (CELEXA) 40 MG tablet Take 1 tablet (40 mg total) by mouth daily.  30 tablet  6  . dexlansoprazole (DEXILANT) 60 MG capsule Take 1 capsule (60 mg total) by mouth daily.  60 capsule  5  . ezetimibe (ZETIA) 10 MG tablet Take 1 tablet (10 mg total) by mouth daily.  30 tablet  11  . fenofibrate 54 MG tablet Take 1 tablet (54 mg total) by mouth daily.  30 tablet  6  . fluocinonide-emollient (LIDEX-E) 0.05 % cream Apply to rash two times daily  30 g  2  . guaiFENesin (MUCINEX) 600 MG 12 hr tablet Take 1,200 mg by mouth 2 (two) times daily.        Marland Kitchen  HYDROcodone-acetaminophen (NORCO/VICODIN) 5-325 MG per tablet Take 1 tablet by mouth every 6 (six) hours as needed for pain. Not to exceed 4 tablets a day  120 tablet  2  . HYDROcodone-acetaminophen (VICODIN) 5-500 MG per tablet Take 1 tablet by mouth every 6 (six) hours as needed for pain (NOT TO EXCEED 4 PER DAY). For pain  120 tablet  5  . levothyroxine (SYNTHROID, LEVOTHROID) 150 MCG tablet Take 1 tablet (150 mcg total) by mouth daily.  30 tablet  6  . meloxicam (MOBIC) 15 MG tablet TAKE ONE TABLET BY MOUTH EVERY DAY AS NEEDED FOR JOINT PAIN  30 tablet  6  . pramipexole (MIRAPEX) 0.25 MG tablet 2 tablets by mouth at bedtime  60 tablet  5  . traMADol (ULTRAM) 50 MG tablet Take 1 tablet (50 mg total) by mouth every 8 (eight) hours as needed for pain.  90 tablet  5    No Known Allergies   Current Medications, Allergies, Past Medical History, Past Surgical History, Family History, and Social History were reviewed in [employer] record.    Review of Systems  Constitutional: Positive for fatigue. Negative for fever, chills and unexpected weight change.  HENT: Positive for sore throat, trouble swallowing and voice change. Negative for ear pain, congestion, rhinorrhea, sneezing and postnasal drip.     Respiratory: Positive for cough, choking and shortness of breath. Negative for chest tightness and wheezing.   Cardiovascular: Positive for chest pain and palpitations. Negative for leg swelling.  Gastrointestinal: Negative for nausea, vomiting, abdominal pain, diarrhea and constipation.  Genitourinary: Negative for dysuria, urgency, frequency, hematuria and flank pain.  Musculoskeletal: Positive for myalgias and arthralgias.  Skin: Negative for rash.  Neurological: Positive for weakness and headaches. Negative for dizziness, tremors, seizures, speech difficulty and light-headedness.  Hematological: Negative for adenopathy. Does not bruise/bleed easily.  Psychiatric/Behavioral: Positive for sleep disturbance. Negative for hallucinations and confusion. The patient is nervous/anxious.      Objective:   Physical Exam  Vitals reviewed. Constitutional: She is oriented to person, place, and time. She appears well-developed and well-nourished.  HENT:  Head: Normocephalic and atraumatic.  Right Ear: External ear normal.  Left Ear: External ear normal.  Mouth/Throat: Oropharynx is clear and moist. No oropharyngeal exudate.  Eyes: Conjunctivae normal and EOM are normal. Pupils are equal, round, and reactive to light.  Neck: Neck supple. No JVD present. No thyromegaly present.  Cardiovascular: Normal rate, regular rhythm, normal heart sounds and intact distal pulses.   Pulmonary/Chest: Effort normal and breath sounds normal. She has no wheezes. She has no rales. She exhibits tenderness.  Abdominal: Soft. Bowel sounds are normal. She exhibits no distension. There is no tenderness.  Musculoskeletal: Normal range of motion. She exhibits tenderness. She exhibits no edema.  Lymphadenopathy:    She has no cervical adenopathy.  Neurological: She is alert and oriented to person, place, and time. She has normal reflexes. No cranial nerve deficit.  Skin: Skin is warm and dry. No rash noted.  Psychiatric:  Her mood appears anxious. Her speech is rapid and/or pressured. She is agitated.    RADIOLOGY DATA:  Reviewed in the EPIC EMR & discussed w/ the patient...  LABORATORY DATA:  Reviewed in the EPIC EMR & discussed w/ the patient...    Assessment & Plan:    Cig Smoker & CT Chest w/ early centrilobular emphysema>  This info really scared her but she is still smoking (now down to <1/4ppd she says); she filled the  Chantix but is too scared to take it, afraid of the side effects (we reviewed the known side effects of smoking!)... NOTE> on repeat CT Chest the radiologist did not call emphysema & she is very pleased w/ this... F/u CXR 1/14 is clear...  Hx ASPIRATION PNEUMONITIS>  Prev episode resolved, lungs are clear, but she reports continued nocturnal regurg into her throat putting her at high risk for repeat asp episodes;  Rec return to GI for their review & active management of this difficult problem... Prev eval w/ CT Chest showed asp pneumonia & ?reactive adenopathy> repeat CT to re-eval the nodes showed resolved RUL infiltrate & decr size of nodes...  Hoarseness/ Globus/ ?VC polyps reported by the pt>  She will f/u at [facility] w/ GI-DrMadanick, and ENT-DrBuckmire...  GERD & Nocturnal regurg into the throat>  She will keep the Dexilant to Bid & f/u w/ GI to see what else they might recommend for this difficult problem...  HYPERCHOLESTEROLEMIA>  Continue diet + off meds from the Lipid Clinic (prev Zetia & Fenofibrate); offered referral to specialists in lipid disorders at [facility] but she wants to try ZETIA monotherapy first- ok...  Hypothyroid>  Continue Synthroid daily & f/u w/ DrEllison for Endocrine...  Fibromyalgia>  She continues to depend upon Vicodin daily, I have encouraged her to follow up w/ Rheumatology or consider Pain Clinic referral...  Anxiety>  She remains on Wellbutrin, Celexa, Xanax; she was encouraged to follow up w/ her prev Psychiatrist to help w/ her nerves...   Patient's  Medications  New Prescriptions   FAMOTIDINE (PEPCID) 40 MG TABLET    Take 1 tablet (40 mg total) by mouth at bedtime as needed for heartburn.   MECLIZINE (ANTIVERT) 25 MG TABLET    Take 1 tablet (25 mg total) by mouth every 4 (four) hours as needed for dizziness.   TEMAZEPAM (RESTORIL) 30 MG CAPSULE    Take 1 capsule (30 mg total) by mouth at bedtime as needed for sleep.  Previous Medications   EZETIMIBE (ZETIA) 10 MG TABLET    Take 1 tablet (10 mg total) by mouth daily.   FENOFIBRATE 54 MG TABLET    Take 1 tablet (54 mg total) by mouth daily.   FLUOCINONIDE-EMOLLIENT (LIDEX-E) 0.05 % CREAM    Apply to rash two times daily   GUAIFENESIN (MUCINEX) 600 MG 12 HR TABLET    Take 1,200 mg by mouth 2 (two) times daily.    Modified Medications   Modified Medication Previous Medication   ALPRAZOLAM (XANAX) 0.5 MG TABLET ALPRAZolam (XANAX) 0.5 MG tablet      As needed four times a day not to exceed 4 tablets per day and no early refills    As needed four times a day not to exceed 4 tablets per day and no early refills   BUPROPION (WELLBUTRIN XL) 300 MG 24 HR TABLET buPROPion (WELLBUTRIN XL) 300 MG 24 hr tablet      Take 1 tablet (300 mg total) by mouth daily.    Take 1 tablet (300 mg total) by mouth daily.   CITALOPRAM (CELEXA) 40 MG TABLET citalopram (CELEXA) 40 MG tablet      Take 1 tablet (40 mg total) by mouth daily.    Take 1 tablet (40 mg total) by mouth daily.   DEXLANSOPRAZOLE (DEXILANT) 60 MG CAPSULE dexlansoprazole (DEXILANT) 60 MG capsule      Take 1 capsule (60 mg total) by mouth daily.    Take 1 capsule (60 mg total) by  mouth daily.   HYDROCODONE-ACETAMINOPHEN (NORCO/VICODIN) 5-325 MG PER TABLET HYDROcodone-acetaminophen (NORCO/VICODIN) 5-325 MG per tablet      Take 1 tablet by mouth every 6 (six) hours as needed for pain. Not to exceed 4 tablets a day    Take 1 tablet by mouth every 6 (six) hours as needed for pain. Not to exceed 4 tablets a day   LEVOTHYROXINE (SYNTHROID, LEVOTHROID) 150  MCG TABLET levothyroxine (SYNTHROID, LEVOTHROID) 150 MCG tablet      Take 1 tablet (150 mcg total) by mouth daily.    Take 1 tablet (150 mcg total) by mouth daily.   MELOXICAM (MOBIC) 15 MG TABLET meloxicam (MOBIC) 15 MG tablet      Take 1 tablet (15 mg total) by mouth daily.    TAKE ONE TABLET BY MOUTH EVERY DAY AS NEEDED FOR JOINT PAIN   PRAMIPEXOLE (MIRAPEX) 0.25 MG TABLET pramipexole (MIRAPEX) 0.25 MG tablet      2 tablets by mouth at bedtime    2 tablets by mouth at bedtime   TRAMADOL (ULTRAM) 50 MG TABLET traMADol (ULTRAM) 50 MG tablet      Take 1 tablet (50 mg total) by mouth every 8 (eight) hours as needed for pain.    Take 1 tablet (50 mg total) by mouth every 8 (eight) hours as needed for pain.  Discontinued Medications   HYDROCODONE-ACETAMINOPHEN (VICODIN) 5-500 MG PER TABLET    Take 1 tablet by mouth every 6 (six) hours as needed for pain (NOT TO EXCEED 4 PER DAY). For pain

## 2012-03-05 ENCOUNTER — Telehealth: Payer: Self-pay | Admitting: Pulmonary Disease

## 2012-03-05 NOTE — Telephone Encounter (Signed)
Notes Recorded by [name], MD on 02/28/2012 at 9:21 AM Please notify patient> Reminder- ret to lab for FASTING blood work... CXR is clear, NAD... --  lmomtcb x1 for pt

## 2012-03-05 NOTE — Telephone Encounter (Signed)
Patient returning call.

## 2012-03-05 NOTE — Telephone Encounter (Signed)
I spoke with patient about results and she verbalized understanding and had no questions 

## 2012-03-29 ENCOUNTER — Telehealth: Payer: Self-pay | Admitting: Pulmonary Disease

## 2012-03-29 MED ORDER — AMOXICILLIN-POT CLAVULANATE 875-125 MG PO TABS: 1.0000 | ORAL_TABLET | Freq: Two times a day (BID) | ORAL | Status: DC

## 2012-03-29 NOTE — Telephone Encounter (Signed)
Spoke with pt and notified of recs per SN She verbalized understanding and states no questions Rx was sent to pharm

## 2012-03-29 NOTE — Telephone Encounter (Signed)
Per SN---  Ok to send in augmentin 875 mg  #14  1 po bid mucinex 600 mg  2 po bid with plenty of fluids Tylenol prn

## 2012-03-29 NOTE — Telephone Encounter (Signed)
Spoke with pt She is c/o sore throat, prod cough with minimal green sputum x 2 days She also c/o sweats, no fever or chills She states no wheezing, SOB, CP, chest tightness Wants rx called in Please advise thanks! No Known Allergies Last ov 02/27/12 Next ov not yet pending

## 2012-04-07 ENCOUNTER — Other Ambulatory Visit: Payer: Self-pay | Admitting: Pulmonary Disease

## 2012-05-18 ENCOUNTER — Other Ambulatory Visit: Payer: Self-pay | Admitting: Pulmonary Disease

## 2012-06-13 ENCOUNTER — Telehealth: Payer: Self-pay | Admitting: Pulmonary Disease

## 2012-06-13 NOTE — Telephone Encounter (Signed)
Called and spoke with pt and she stated that she has blisters on her lips x 1 1/2 - 2 wks.  She has a cream that she has been using but this is not helping.  Pt is requesting that something else be called to her pharmacy for this.  SN please advise. Thanks  No Known Allergies

## 2012-06-14 MED ORDER — ACYCLOVIR 400 MG PO TABS: 400.0000 mg | ORAL_TABLET | ORAL | Status: DC

## 2012-06-14 NOTE — Telephone Encounter (Signed)
Per SN---   Acyclovir 400 mg  #20  1 po qid until gone for outbreak  Can refill x 5.  thanks

## 2012-06-14 NOTE — Telephone Encounter (Signed)
Spoke with patient, aware Rx has been called in to verified pharmacy. Nothing further needed at this time

## 2012-07-16 ENCOUNTER — Other Ambulatory Visit: Payer: Self-pay | Admitting: Pulmonary Disease

## 2012-07-16 ENCOUNTER — Telehealth: Payer: Self-pay | Admitting: Pulmonary Disease

## 2012-07-16 MED ORDER — HYDROCODONE-ACETAMINOPHEN 5-325 MG PO TABS: 1.0000 | ORAL_TABLET | Freq: Four times a day (QID) | ORAL | Status: DC | PRN

## 2012-07-16 NOTE — Telephone Encounter (Signed)
Spoke with pt states having leg ,feet and joint pain. Requesting vicodin 5-325 take 1 tablet q 6 hours prn pain . No Known Allergies Dr Vista Mink advise Thank you

## 2012-07-16 NOTE — Telephone Encounter (Signed)
Called the pharmacy and the last time this was filled was 06/15/12.  Pharmacy stated that the pt has not been filling or trying to fill this early.  Ok given to the pharmacy to fill this for the pt and they will give her 5 refills.  Pt is aware that med has been called to the pharmacy. Nothing further is needed.

## 2012-08-15 ENCOUNTER — Telehealth: Payer: Self-pay | Admitting: Pulmonary Disease

## 2012-08-15 MED ORDER — METHOCARBAMOL 500 MG PO TABS: 500.0000 mg | ORAL_TABLET | Freq: Three times a day (TID) | ORAL | Status: DC

## 2012-08-15 NOTE — Telephone Encounter (Signed)
Pt is aware of SN recs. Rx has been sent to [facility], [place] per the pt's request.

## 2012-08-15 NOTE — Telephone Encounter (Signed)
I spoke with the pt and she is c/o neck pain x 1 week. Pt states today the pain is much worse then it has been. She describes the pain as an ache from her chin all the way down to her shoulders. The pain is worse on the back of her neck. Pt states the only comfortable position is laying flat on her back. She states her head feels heavy like she cannot lift it off the pillow. Pt states she has taken tramadol, mobic, and vicodin without relief. Pt denies doing any strenuous activity to injure neck, and no trauma to the area. Pt denies and stiffness, swelling, redness, bumps, knots on the neck. Pt states x 2 days she did also have a headache but his has subsided. Please advise. Carron Curie, CMA No Known Allergies

## 2012-08-15 NOTE — Telephone Encounter (Signed)
Per SN---  Needs further eval from ortho--try Dr. Ophelia Charter Can get a neck brace from pharmacy  To help relieve pain Apply heat  She can use the 3 pain meds that she has Add robaxin 500 mg  1 po tid for muscle spasms.

## 2012-10-01 ENCOUNTER — Telehealth: Payer: Self-pay | Admitting: Pulmonary Disease

## 2012-10-01 NOTE — Telephone Encounter (Signed)
I spoke with the pt and she states that she has been feeling really depressed, emotional, crying a lot, feels stressed, feels like she could "jump out of skin" and she is also having increase in her fibromyalgia pain. She states her pain meds are not helping her pain at all. I reviewed med list and she is taking meds as prescribed. She has not had any suicidal thoughts. Pt states she feels a lot of it is due to her increase in pain and discomfort that is causing her to feel this way. Please advise. Carron Curie, CMA No Known Allergies

## 2012-10-02 NOTE — Telephone Encounter (Signed)
Per SN---  She is on max pain meds.    Hydrocodone mobic Tramadol  There is nothing else to use.  Does she want psych counseling, refer to pain clinic? Or counseling with Dr. Dellia Cloud.  And she is on  Alprazolam celexa Robaxin mirapex

## 2012-10-02 NOTE — Telephone Encounter (Signed)
I spoke with pt and she stated she has done counseling  in the past and did nothing further her. She did not need anything further

## 2012-11-10 ENCOUNTER — Other Ambulatory Visit: Payer: Self-pay | Admitting: Pulmonary Disease

## 2012-11-13 ENCOUNTER — Telehealth: Payer: Self-pay | Admitting: Pulmonary Disease

## 2012-11-13 ENCOUNTER — Other Ambulatory Visit: Payer: Self-pay | Admitting: Pulmonary Disease

## 2012-11-13 MED ORDER — ALPRAZOLAM 0.5 MG PO TABS: ORAL_TABLET | ORAL | Status: DC

## 2012-11-13 NOTE — Telephone Encounter (Signed)
Last refill 9.5.14 w/ 0 additional Last ov 1.21.14 w/ SN, follow up in 6 months Pt is overdue for appt with SN as of 08/2012 Per Leigh, rx is to be denied.

## 2012-11-13 NOTE — Telephone Encounter (Signed)
Called rx in for the pt for the alprazolam.  Nothing further is needed.

## 2012-12-10 ENCOUNTER — Telehealth: Payer: Self-pay | Admitting: Pulmonary Disease

## 2012-12-10 NOTE — Telephone Encounter (Signed)
I called [facility]. Pt last had [facility]co refilled 11/10/12 #120. Last OV 03/01/12 Pending 12/19/12 Please advise SN thanks

## 2012-12-11 MED ORDER — HYDROCODONE-ACETAMINOPHEN 5-325 MG PO TABS: 1.0000 | ORAL_TABLET | Freq: Four times a day (QID) | ORAL | Status: DC | PRN

## 2012-12-11 NOTE — Telephone Encounter (Signed)
rx has been printed and will be placed out front for the pt to come by and pick up this afternoon.  thanks

## 2012-12-11 NOTE — Telephone Encounter (Signed)
Pt advised. Tasha [name], CMA  

## 2012-12-19 ENCOUNTER — Telehealth: Payer: Self-pay | Admitting: Pulmonary Disease

## 2012-12-19 ENCOUNTER — Encounter: Payer: Self-pay | Admitting: Pulmonary Disease

## 2012-12-19 ENCOUNTER — Encounter (INDEPENDENT_AMBULATORY_CARE_PROVIDER_SITE_OTHER): Payer: Self-pay

## 2012-12-19 ENCOUNTER — Other Ambulatory Visit (INDEPENDENT_AMBULATORY_CARE_PROVIDER_SITE_OTHER): Payer: BC Managed Care – PPO

## 2012-12-19 ENCOUNTER — Ambulatory Visit
Admission: RE | Admit: 2012-12-19 | Discharge: 2012-12-19 | Disposition: A | Discharge status: Home / Self Care | Payer: BC Managed Care – PPO | Source: Ambulatory Visit | Attending: Pulmonary Disease | Admitting: Pulmonary Disease

## 2012-12-19 ENCOUNTER — Ambulatory Visit (INDEPENDENT_AMBULATORY_CARE_PROVIDER_SITE_OTHER): Payer: BC Managed Care – PPO | Admitting: Pulmonary Disease

## 2012-12-19 ENCOUNTER — Ambulatory Visit (INDEPENDENT_AMBULATORY_CARE_PROVIDER_SITE_OTHER)
Admission: RE | Admit: 2012-12-19 | Discharge: 2012-12-19 | Disposition: A | Discharge status: Home / Self Care | Payer: BC Managed Care – PPO | Source: Ambulatory Visit | Attending: Pulmonary Disease | Admitting: Pulmonary Disease

## 2012-12-19 VITALS — BP 124/70 | HR 60 | Temp 98.1°F | Ht 67.0 in | Wt 186.6 lb

## 2012-12-19 DIAGNOSIS — K219 Gastro-esophageal reflux disease without esophagitis: Secondary | ICD-10-CM

## 2012-12-19 DIAGNOSIS — E785 Hyperlipidemia, unspecified: Secondary | ICD-10-CM | POA: Diagnosis not present

## 2012-12-19 DIAGNOSIS — F411 Generalized anxiety disorder: Secondary | ICD-10-CM

## 2012-12-19 DIAGNOSIS — F329 Major depressive disorder, single episode, unspecified: Secondary | ICD-10-CM

## 2012-12-19 DIAGNOSIS — E7800 Pure hypercholesterolemia, unspecified: Secondary | ICD-10-CM | POA: Diagnosis not present

## 2012-12-19 DIAGNOSIS — F17200 Nicotine dependence, unspecified, uncomplicated: Secondary | ICD-10-CM | POA: Diagnosis not present

## 2012-12-19 DIAGNOSIS — E039 Hypothyroidism, unspecified: Secondary | ICD-10-CM | POA: Diagnosis not present

## 2012-12-19 DIAGNOSIS — Z23 Encounter for immunization: Secondary | ICD-10-CM | POA: Diagnosis not present

## 2012-12-19 DIAGNOSIS — K589 Irritable bowel syndrome without diarrhea: Secondary | ICD-10-CM

## 2012-12-19 DIAGNOSIS — R51 Headache: Secondary | ICD-10-CM

## 2012-12-19 DIAGNOSIS — R002 Palpitations: Secondary | ICD-10-CM

## 2012-12-19 DIAGNOSIS — IMO0001 Reserved for inherently not codable concepts without codable children: Secondary | ICD-10-CM

## 2012-12-19 DIAGNOSIS — G4761 Periodic limb movement disorder: Secondary | ICD-10-CM

## 2012-12-19 DIAGNOSIS — F3289 Other specified depressive episodes: Secondary | ICD-10-CM

## 2012-12-19 LAB — BASIC METABOLIC PANEL
BUN: 16 mg/dL (ref 6–23)
CO2: 28 mEq/L (ref 19–32)
Calcium: 9.3 mg/dL (ref 8.4–10.5)
Chloride: 104 mEq/L (ref 96–112)
Creatinine, Ser: 0.9 mg/dL (ref 0.4–1.2)
GFR: 70.32 mL/min (ref >60.00)
Glucose, Bld: 101 mg/dL — ABNORMAL HIGH (ref 70–99)
Potassium: 4 mEq/L (ref 3.5–5.1)
Sodium: 138 mEq/L (ref 135–145)

## 2012-12-19 LAB — CBC WITH DIFFERENTIAL/PLATELET
Basophils Absolute: 0 10*3/uL (ref 0.0–0.1)
Basophils Relative: 0.4 % (ref 0.0–3.0)
Eosinophils Absolute: 0 10*3/uL (ref 0.0–0.7)
Eosinophils Relative: 0.2 % (ref 0.0–5.0)
HCT: 40.8 % (ref 36.0–46.0)
Hemoglobin: 13.8 g/dL (ref 12.0–15.0)
Lymphocytes Relative: 41.8 % (ref 12.0–46.0)
Lymphs Abs: 3.6 10*3/uL (ref 0.7–4.0)
MCHC: 33.8 g/dL (ref 30.0–36.0)
MCV: 95.7 fl (ref 78.0–100.0)
Monocytes Absolute: 0.4 10*3/uL (ref 0.1–1.0)
Monocytes Relative: 5 % (ref 3.0–12.0)
Neutro Abs: 4.5 10*3/uL (ref 1.4–7.7)
Neutrophils Relative %: 52.6 % (ref 43.0–77.0)
Platelets: 190 10*3/uL (ref 150.0–400.0)
RBC: 4.26 Mil/uL (ref 3.87–5.11)
RDW: 12.9 % (ref 11.5–14.6)
WBC: 8.5 10*3/uL (ref 4.5–10.5)

## 2012-12-19 LAB — URINALYSIS
Bilirubin Urine: NEGATIVE
Hgb urine dipstick: NEGATIVE
Ketones, ur: NEGATIVE
Leukocytes, UA: NEGATIVE
Nitrite: NEGATIVE
Specific Gravity, Urine: >=1.03 (ref 1.000–1.030)
Total Protein, Urine: NEGATIVE
Urine Glucose: NEGATIVE
Urobilinogen, UA: 0.2 (ref 0.0–1.0)
pH: 6 (ref 5.0–8.0)

## 2012-12-19 LAB — HEPATIC FUNCTION PANEL
ALT: 51 U/L — ABNORMAL HIGH (ref 0–35)
AST: 42 U/L — ABNORMAL HIGH (ref 0–37)
Albumin: 4.1 g/dL (ref 3.5–5.2)
Alkaline Phosphatase: 77 U/L (ref 39–117)
Bilirubin, Direct: 0 mg/dL (ref 0.0–0.3)
Total Bilirubin: 0.7 mg/dL (ref 0.3–1.2)
Total Protein: 7.4 g/dL (ref 6.0–8.3)

## 2012-12-19 LAB — LDL CHOLESTEROL, DIRECT: Direct LDL: 217.7 mg/dL

## 2012-12-19 LAB — LIPID PANEL
Cholesterol: 290 mg/dL — ABNORMAL HIGH (ref 0–200)
HDL: 31.4 mg/dL — ABNORMAL LOW (ref >39.00)
Total CHOL/HDL Ratio: 9
Triglycerides: 214 mg/dL — ABNORMAL HIGH (ref 0.0–149.0)
VLDL: 42.8 mg/dL — ABNORMAL HIGH (ref 0.0–40.0)

## 2012-12-19 MED ORDER — ALPRAZOLAM 0.5 MG PO TABS: ORAL_TABLET | ORAL | Status: DC

## 2012-12-19 MED ORDER — CITALOPRAM HYDROBROMIDE 40 MG PO TABS: 40.0000 mg | ORAL_TABLET | Freq: Every day | ORAL | Status: DC

## 2012-12-19 MED ORDER — PRAMIPEXOLE DIHYDROCHLORIDE 0.25 MG PO TABS: ORAL_TABLET | ORAL | Status: DC

## 2012-12-19 MED ORDER — EZETIMIBE 10 MG PO TABS: 10.0000 mg | ORAL_TABLET | Freq: Every day | ORAL | Status: DC

## 2012-12-19 MED ORDER — MELOXICAM 15 MG PO TABS: 15.0000 mg | ORAL_TABLET | Freq: Every day | ORAL | Status: DC

## 2012-12-19 MED ORDER — ACYCLOVIR 400 MG PO TABS: 400.0000 mg | ORAL_TABLET | ORAL | Status: DC | PRN

## 2012-12-19 MED ORDER — BUPROPION HCL ER (XL) 300 MG PO TB24: 300.0000 mg | ORAL_TABLET | Freq: Every day | ORAL | Status: DC

## 2012-12-19 MED ORDER — TRAMADOL HCL 50 MG PO TABS: 50.0000 mg | ORAL_TABLET | Freq: Three times a day (TID) | ORAL | Status: DC | PRN

## 2012-12-19 MED ORDER — LEVOTHYROXINE SODIUM 150 MCG PO TABS: 150.0000 ug | ORAL_TABLET | Freq: Every day | ORAL | Status: DC

## 2012-12-19 MED ORDER — DEXLANSOPRAZOLE 60 MG PO CPDR: 60.0000 mg | DELAYED_RELEASE_CAPSULE | Freq: Every day | ORAL | Status: DC

## 2012-12-19 NOTE — Progress Notes (Signed)
Subjective:    Patient ID: Tasha [name], female    DOB: [birth year], 52 y.o.   MRN: [id number]  HPI 52 y/o WF here for a follow up visit... she has multiple medical problems including Asp Pneumonia from LER;  Cigarette smoker;  ?MVP w/ CP & Palpit;  Severe Hypercholesterolemia;  Hypothyroidism;  GERD & Dysphagia w/ LER & asp pneum;  IBS/ Colon polyps;  Abn LFTs from hepatic steatosis;  Severe FM;  Chr headaches;  RLS;  Anxiety & Depression...   ~  January 25, 2011:  44mo ROV & she is c/o pain all over & she wants refills on the "absolute max Vicodin & Xanax" (both Qid= 4/d)... She notes lots of HAs, LBP, legs ache & feet hurt all the time; lots of stress as daugh is preg w/ twins...  She is still smoking 1/2ppd & declines smoking cessation counseling;  She denies cough, sputum or SOB; states her swallowing is good w/o reflux or choking; weight is up 8# to 191# today despite attempts at diet/ exercise/ etc; she stopped the Zetia & Fenofib from the lipid clinic ?why- she thought it made her gain wt- & is encouraged to f/u w/ them; still on Dexilant Bid w/ globus phenomenon & she never went back to see [facility]Madanick at [facility]...  We discussed & I offered Rheum eval, HA clinic referral, Psychiatric referral but she declines these interventions...  ~  August 17, 2011:  44mo ROV & she is here for med refills and TDAP (new twin gradchildren at home);  She initially noted "everything's going OK" then launched into a litany of complaints> sore throat & right side of neck hurts (use MMW + pain meds/ muscle relaxers);  Joint pains, muscle aches, feet hurt, +HAs, feels tired all the time, etc (she has Vicodin, Tramadol, Mobic, & we will refer to DrTruslow for f/u rheum eval);  She found 2 ticks recently w/ little red marks on her side (Rx w/ Lidex-E cream prn)...     We reviewed in detail her Hx severe hyperlipidemia and reported intol to all meds tried; we reviewed low chol/ low fat diet & gave her a hand-out; we reviewed  prev meds & was INTOL all statins due to leg pains that occurred within several days of starting these meds- even low dose/ intermittent days/ etc; the Lipid Clinic last tried Zetia10 & [phone] but she stopped on her own due to wt gain; I have suggested referral to [facility] but she wants to try these meds here first, therefore given ZETIA 10mg /d & plan f/u FLP in 8 wks...   We reviewed prob list, meds, xrays and labs> see below for updates>>  ~  February 27, 2012:  46mo ROV & Tasha [name] has mult somatic complaints- c/o incr heartburn & reflux despite her Dexilant Bid & antireflux regimen (we discussed adding Pepcid40 Qhs & refer back to GI at [facility]);  c/o dizzy x several weeks esp w/ movement 7 position changes c/w BPPV & we have prescribed Antivert 25mg  prn;  c/o fatigue, tired all the time, not resting well, & pain "all over" c/w her known severe FM (we discussed adding Restoril30mg  Qhs)...  We reviewed the following medical problems during today's office visit >> she did not bring meds or med list to the OV today...    ENT- Hx TMJ, Globus, LER> she has seen DrBates & Rosen in [place] & DrBuckmire at [facility]; Globus due to LER & they rec incr PPI to Bid.Marland KitchenMarland Kitchen  Pulm- smoker, Hx asp pneum, +emphysema on CTChest> still smoking ~5cig/d she says; denies cough, sput, hemoptysis, dyspnea, CP, etc...    Hx MVP> she has CP more related to FM than to MVP; denies palpit, ch in SOB, edema; she knows to avoid caffeine, asked to quit smoking, etc...    CHOL> on [place], [id number]; due for FLP & she will ret to our lab for this...    Hypothy> on Levothy150; states she's been taking it daily; due for f/u TSH- pending    GI- GERD, gastritis, dysphagia, IBS> on Dexilant60Bid-"I need it twice daily"; we reviewed antireflux regimen w/ elev HOB 6", NPO after dinner, add Pepcid40 Qhs...    Hx Hep steatosis> on diet Rx w/ weight 192# which is up 3#, BMI~30; we reviewed diet, exercise, wt reduction strategies...    FM> on  Hydrocodone5, Mobic15, Tramadol50; we reviewed the importance of a good night's sleep- add Restoril 30mg Qhs as a trial...    Hx chronic HAs> prev eval by Drs Meryl Crutch & Orlin Hilding- they suggested the Wellbutrin & Celexa; she's tried Maxalt in the past; using Vicodin as needed...    RLS> on Mirapex0.25-2Qhs & this helps she says but she only uses it as needed...    Anxiety/ Depression> on WellbutrinXL300, Celexa40, Alpraz0.5 (states Rebeca Allegra is the only thing that helps her)... We reviewed prob list, meds, xrays and labs> see below for updates >> [place] Flu vaccine today... CXR 1/14 shows normal heart size, clear lungs, WNL.Marland KitchenMarland Kitchen LABS 1/14:  She will ret for FASTING blood work this week=> pending  ~  December 19, 2012:  13mo ROV & Tasha [name] is c/o feeling tired all the time, HAs, & musc aches + pain in joints- hips, feet, etc...  We reviewed the following medical problems during today's office visit >>     ENT- Hx TMJ, Globus, LER> she has seen DrBates & Rosen in [place] & DrBuckmire at [facility]; Globus due to LER & they rec incr PPI to Bid...    Pulm- smoker, Hx asp pneum, +emphysema on CTChest> still smoking ~5cig/d she says; denies cough, sput, hemoptysis, dyspnea, CP, etc; offered smoking cessation help, she declines...    Hx MVP> she has CP more related to FM than to MVP; denies palpit, ch in SOB, edema; she knows to avoid caffeine, asked to quit smoking, etc...    CHOL> on Zetia10 & off Feno54; Hx POOR COMPLIANCE w/ MED Rx> FLP 11/14 showed TChol 290, TG 214, HDL 31, LDL 218 => refer back to Lipid Clinic...    Hypothy> on Levothy150; states she's been taking it daily but call to Pharm proves intermittent filling of med rx> needs to take pill everyday & ret for full thyroid panel...    GI- GERD, gastritis, dysphagia, IBS> on Dexilant60Bid-"I need it twice daily"; we reviewed antireflux regimen w/ elev HOB 6", NPO after dinner, add Pepcid40 Qhs...    Hx Hep steatosis> on diet Rx w/ weight 187# which is down 5#, BMI~30; we  reviewed diet, exercise, wt reduction strategies needed to improve LFTs...    FM> on Hydrocodone5 (max4/d), Mobic15, Tramadol50; she has seen mult specialists- we reviewed the importance of a good night's sleep & low impact exercise every day...    Hx chronic HAs> prev eval by Drs Meryl Crutch & Orlin Hilding- they suggested the WellbutrinXL300 & Celexa40; she's tried Maxalt in the past; using Vicodin as above...    RLS> on Mirapex0.25-2Qhs & this helps she says but she only uses it as needed.Marland KitchenMarland Kitchen  Anxiety/ Depression> on WellbutrinXL300, Celexa40, Alpraz0.5 (states Rebeca Allegra is the only thing that helps her)... We reviewed prob list, meds, xrays and labs> see below for updates >> OK 2014 Flu vaccine today... Needs med refills today... CXR 11/14 showed normal heart size, clear lungs, mild degen changes in TSpine, NAD...  LABS 11/14:  FLP- way off on diet alone w/ TChol=290, LDL=218;  Chems- wnl x min elev LFTs Got=42 GPT=51;  CBC- wnl;  VitD=29;  UA- clear...           Problem List:  Hx of TMJ PAIN> GLOBUS Phenomenon believed related to GERD> "lump in my throat & feeling like I'm swallowing golf balls"  OROPHARYNGEAL DYSPHAGIA> ~  9/10: ENT eval by Darlin Priestly (referred by DrSMiller)- Globus likely due to reflux, continue PPI Bid, but she only takes intermittently... ~  1/12:  Pt requested ENT second opinion w/ DrRosen- referred> we don't have copies of his eval... ~  1/12 & 2/12:  Eval at [facility] by GI-DrMadanick & ENT-DrBuckmire> pt reports they found polyps on her vocal cords.  CIGARETTE SMOKER> she continues to smoke cigarettes daily; on MUCINEX 1-2Bid w/ fluids. ASPIRATION PNEUMONIA> this occurred 2/12 & was referred to [place] for f/u from [facility]... Mild Centrilobular Emphysema> noted on CT Chest 3/12... ~  CXR 9/10 showed norm heart size, clear lungs, NAD.Marland Kitchen. ~  CXR 2/12 showed patchy opacity right mid lung/ RML... ~  CT Chest 3/12 showed RUL airsp dis & thoracic adenopathy ?reactive, mild centilob  emphysema, hep steatosis.Marland Kitchen. ~  f/u CXR 3/12 & 4/12> resolution of the right sided infiltrate, clear, WNL.Marland Kitchen. ~  f/u CT Chest 5/12 showed resolution of prev airsp dis, no suspicious nodules or masses, decreased size of nodes, NAD.Marland Kitchen. ~  f/u CXR 1/14 showed normal heart size, clear lungs, WNL.Marland Kitchen. ~  CXR 11/14 showed normal heart size, clear lungs, mild degen changes in TSpine, NAD.  ? of MITRAL VALVE PROLAPSE (ICD-424.0) - followed by Walker Kehr for Cards... she has intermittent CP (likely related to her FM), and palpitations that continue to be a problem for her- advised to quit nicotine & caffeine...  ~  2DEcho 10/06 was normal without MVP seen, no MR, norm LVF... prev 2D in 1990 w/ mild bowing of ant leaflet c/w mild MVP at that time... ~  NuclearStressTest 2/02 was WNL- no ischemia, no infarction, EF=73%... ~  Cardiac eval 9/10 by Walker Kehr for atyp CP- NuclearStressTest was normal- EF=70%...  HYPERCHOLESTEROLEMIA (ICD-272.0) - hx of Chol values >300 in the past (esp when thyroid was underactive)... she has been intolerant to all statin meds and has been managed by the Lipid Clinic> currently on diet + Zetia & Fenofibrate==> she stopped these on her own... ~  FLP 7/09 showed TChol 301, TG 284, HDL 32, LDL 210... referred to LipidClinic, tried on Crestor5 but she stopped due to leg pain. ~  FLP 7/10 showed TChol 318, TG 318, HDL 32, LDL 228... given Zocor20, but stopped this on her own... rec> return to LipidClinic. ~  FLP 1/11 showed TChol 283, TG 271, HDL 32, LDL 212... she reports trial Lipitor but intol w/ leg pain... ~  5/11:  we discussed trial of Livalo 2mg  samples start 1/2 tab Qhs... she reports stopping this med after 2 wks c/o leg pains & she is not willing to continue statin Rx ==> rec return to Lipid Clinic. ~  2/12: she reports back on diet + Zetia/ Fenofibrate per [facility] staff (note: TChol in 2011= 283-310 range). ~  Followed via the Lipid Clinic> she has been intermittent about filling her Rx  & stopped Zetia/ Fenofib160 due to wt gain she says... ~  7/13:  Offered referral to [facility] for Lipid Management but she wants to try ZETIA 10mg /d monotherapy to check tolerability & efficacy... ~  1/14:  FLP on Zetia10+Feno54 => she never went to the labs for the needed blood work... ~  11/14: on Zetia10 & off Feno54; Hx POOR COMPLIANCE w/ MED Rx> FLP 11/14 showed TChol 290, TG 214, HDL 31, LDL 218 => refer back to Lipid Clinic   HYPOTHYROIDISM (ICD-244.9) - on SYNTHROID 167mcg/d... hypothyroidism initially diagnosed in the 1980's and she's been on meds ever since then... ~  labs 12/08 showed TSH= 31 ?off her meds... rec to restart her Synthroid regularly... ~  labs 7/09 showed TSH= 2.24 ~  labs 3/10 showed TSH= 0.95 ~  labs 7/10 showed TSH= 4.22 ~  labs 1/11 showed TSH= 10.95... pt reminded to take Synthroid dose 186mcg/d regularly. ~  3/11: pt saw DrEllison for consult & Synthroid incr to 16mcg/d but she "didn't feel right" on this & ?ret to 162mcg/d. ~  2/12:  labs showed TSH=29.4 & she states taking med daily but confirmed w/ pharm that she's been out for 2weeks! ~  1/14:  She states she is taking her Synthroid 150 daily;  TSH= she never went to the lab for the needed blood work... ~  11/14: on Levothy150; states she's been taking it daily but call to Pharm proves intermittent filling of med rx> needs to take pill everyday & ret for full thyroid panel.  GERD (ICD-530.81) & DYSPHAGIA UNSPECIFIED (ICD-787.20) - prev GI eval by DrSamLeBauer- now sees DrGessner & was advised to quit smoking & caffeine, +given Aciphex & Carafate but she stopped both meds and used Tums/ Rolaids/ Maalox etc... now she notes best response to [facility] 60mg  1-2 daily... ~  EGD 10/10 by DrGessner showed severe gastritis, norm esoph, norm duodenum, neg HPylori. ~  pt referred to [facility] by DrGessner but cancelled when all her symptoms improved on DEXILANT 60mg /d. ~  1/12:  pt requested referral to [facility] for her GERD,  dysphagia, LER, Globus> seen by [facility] & his note is reviewed... ~  5/12:  Pt notes continued globus sensation making swallowing difficult & nocturnal regurgitation into her throat occuring 1-2 per week; referred back to [facility] (she never went). ~  1/14:  She is c/o increased reflux symptoms despite Dexilant60Bid & antireflux regimen; we reviewed regimen- elev HOB 6", NPO after dinner, etc; add Pepcid40 Qhs (she never did), and referred back to [facility] (she never went)... ~  11/14: on Dexilant60Bid-"I need it twice daily"; we reviewed antireflux regimen w/ elev HOB 6", NPO after dinner, add Pepcid40 Qhs...  IBS (ICD-564.1) - she notes that this is one of her chr disability diagnoses... ~  Colonoscopy 3/93 by DrSam was WNL...symptoms were felt to be IBS... ~  CTAbd 8/06 showed fatty liver, neg abd, s/p hyst, left ovarian cyst, norm right ovary... ~  Colonoscopy 3/10 by drGessner showed 7mm hyperplastic polyp, hems, otherw neg... f/u planned 40yrs. ~  5/10:  f/u DrGessner w/ IBS/ gas/ alt bowel habits- given ALIGN & BENTYL...  LIVER FUNCTION TESTS, ABNORMAL (ICD-794.8) - she had second GI opinion and eval of AbnLFT's by DrBuccini in 2002... he felt she had Fatty Liver Disease... she's been counselled to lose weight/ low fat diet/ incr exercise... ~  labs 7/09 showed SGOT= 45, SGPT= 65 ~  labs 3/10 showed SGOT= 48, SGPT= 64 ~  labs 7/10 showed SGOT= 59, SGPT= 77 ~  labs 1/11 showed SGOT= 42, SGPT= 50 ~  labs 2/12 showed SGOT= 34, SGPT= 36; CT Chest showed hepatic steatosis... ~  Labs 1/14> she never went to the l;ab for her f/u blood work... ~  Labs 11/14 showed SGOT= 42, SGPT= 51... We reviewed the importance of wt reduction...  FIBROMYALGIA (ICD-729.1) - over the years she has seen DrTruslow, DrZeminski, & DrDeveshwar for Rheum opinions... she has also been treated by DrWillen/chiropractor... she has tried Flexeril, DCN100, Vicodin, Mobic, Skelaxin... ~  12/12:  she requests refill of VICODIN up  to 4/d prn, TRAMADOL 50mg Qid prn, MOBIC 15mg /d... ~  7/13:  She wants 31mo refills on her VICODIN, TRAMADOL, MOBIC... ~  11/14: now w/ the new FDA/ DEA rules she is required to come to the office to pick up written Rx for Hydrocodone each month...  HEADACHE, CHRONIC (ICD-784.0) - she has been eval by Dr Meryl Crutch & DrWeymann for Neurology- they rec the Wellbutrin & Celexa... prev Neuro eval in [place] 1[place] w/ DrHaworth for c/o numbness & tingling- (of note he recorded that her ROS was pos for every question that he asked her)... he rec MRI Brain, NCV's, and extensive lab work- we never received a follow up note after the initial visit... prev MRI from Neuro in [place] was WNL in the 1990's... ~  9/10: eval by DrReynolds- he thought Migraines & Rx w/ Maxalt... ~  6/11:  f/u Neuro w/ DrReynolds- continue Maxalt & add Nortriptyline 25mg  Qhs prophylaxis. ~  7/13:  She uses the Vicodin & Tramadol for the HAs...  PERIODIC LIMB MOVEMENT DISORDER (ICD-327.51) - she had a prev sleep study 1/04 w/ signif periodic limb movements (leg jerks) w/ sleep disruption (study showed RDI= 1, but PLM index was 11- mostly arousals)... tried on MIRAPEX 0.25mg - 2 Qhs but she didn't continue> asked for refill Rx 11/11 & thereafter...  ANXIETY (ICD-300.00) - on ALPRAZOLAM 0.5mg Qid- "it's the only thing that helps". DEPRESSION (ICD-311) - on WELLBUTRIN XL 300mg /d,  & CELEXA 40mg /d. ~  3/12:  she hasn't seen psychiatry in >10 yrs she says & has severe anxiety issues affecting her medical condition... rec referral back to DrMcKinney et al for their help in managing her problem (she never went)...   Past Surgical History  Procedure Laterality Date  . Tonsillectomy    . Wrist surgery    . Arm surgery    . Foot surgery      Outpatient Encounter Prescriptions as of 12/19/2012  Medication Sig  . acyclovir (ZOVIRAX) 400 MG tablet Take 1 tablet (400 mg total) by mouth every 4 (four) hours while awake.  Marland Kitchen ALPRAZolam (XANAX) 0.5 MG  tablet TAKE ONE TABLET BY MOUTH 4 TIMES DAILY AS NEEDED (DO NOT EXCEED 4 TABLETS PER DAY AND NO EARLY REFILLS)  . buPROPion (WELLBUTRIN XL) 300 MG 24 hr tablet Take 1 tablet (300 mg total) by mouth daily.  . citalopram (CELEXA) 40 MG tablet Take 1 tablet (40 mg total) by mouth daily.  Marland Kitchen dexlansoprazole (DEXILANT) 60 MG capsule Take 1 capsule (60 mg total) by mouth daily.  Marland Kitchen ezetimibe (ZETIA) 10 MG tablet Take 1 tablet (10 mg total) by mouth daily.  Marland Kitchen HYDROcodone-acetaminophen (NORCO/VICODIN) 5-325 MG per tablet Take 1 tablet by mouth every 6 (six) hours as needed. Not to exceed 4 tablets a day  . levothyroxine (SYNTHROID, LEVOTHROID) 150 MCG tablet Take 1 tablet (150  mcg total) by mouth daily.  . meclizine (ANTIVERT) 25 MG tablet Take 1 tablet (25 mg total) by mouth every 4 (four) hours as needed for dizziness.  . meloxicam (MOBIC) 15 MG tablet Take 1 tablet (15 mg total) by mouth daily.  . pramipexole (MIRAPEX) 0.25 MG tablet 2 tablets by mouth at bedtime  . traMADol (ULTRAM) 50 MG tablet Take 1 tablet (50 mg total) by mouth every 8 (eight) hours as needed for pain.  . [DISCONTINUED] amoxicillin-clavulanate (AUGMENTIN) 875-125 MG per tablet Take 1 tablet by mouth 2 (two) times daily.  . [DISCONTINUED] famotidine (PEPCID) 40 MG tablet Take 1 tablet (40 mg total) by mouth at bedtime as needed for heartburn.  . [DISCONTINUED] fenofibrate 54 MG tablet Take 1 tablet (54 mg total) by mouth daily.  . [DISCONTINUED] fluocinonide-emollient (LIDEX-E) 0.05 % cream Apply to rash two times daily  . [DISCONTINUED] guaiFENesin (MUCINEX) 600 MG 12 hr tablet Take 1,200 mg by mouth 2 (two) times daily.    . [DISCONTINUED] methocarbamol (ROBAXIN) 500 MG tablet Take 1 tablet (500 mg total) by mouth 3 (three) times daily.  . [DISCONTINUED] temazepam (RESTORIL) 30 MG capsule Take 1 capsule (30 mg total) by mouth at bedtime as needed for sleep.    No Known Allergies   Current Medications, Allergies, Past Medical  History, Past Surgical History, Family History, and Social History were reviewed in [employer] record.    Review of Systems  Constitutional: Positive for fatigue. Negative for fever, chills and unexpected weight change.  HENT: Positive for sore throat, trouble swallowing and voice change. Negative for congestion, ear pain, postnasal drip, rhinorrhea and sneezing.   Respiratory: Positive for cough, choking and shortness of breath. Negative for chest tightness and wheezing.   Cardiovascular: Positive for chest pain and palpitations. Negative for leg swelling.  Gastrointestinal: Negative for nausea, vomiting, abdominal pain, diarrhea and constipation.  Genitourinary: Negative for dysuria, urgency, frequency, hematuria and flank pain.  Musculoskeletal: Positive for arthralgias and myalgias.  Skin: Negative for rash.  Neurological: Positive for weakness and headaches. Negative for dizziness, tremors, seizures, speech difficulty and light-headedness.  Hematological: Negative for adenopathy. Does not bruise/bleed easily.  Psychiatric/Behavioral: Positive for sleep disturbance. Negative for hallucinations and confusion. The patient is nervous/anxious.      Objective:   Physical Exam  Vitals reviewed. Constitutional: She is oriented to person, place, and time. She appears well-developed and well-nourished.  HENT:  Head: Normocephalic and atraumatic.  Right Ear: External ear normal.  Left Ear: External ear normal.  Mouth/Throat: Oropharynx is clear and moist. No oropharyngeal exudate.  Eyes: Conjunctivae and EOM are normal. Pupils are equal, round, and reactive to light.  Neck: Neck supple. No JVD present. No thyromegaly present.  Cardiovascular: Normal rate, regular rhythm, normal heart sounds and intact distal pulses.   Pulmonary/Chest: Effort normal and breath sounds normal. She has no wheezes. She has no rales. She exhibits tenderness.  Abdominal: Soft. Bowel sounds  are normal. She exhibits no distension. There is no tenderness.  Musculoskeletal: Normal range of motion. She exhibits tenderness. She exhibits no edema.  Lymphadenopathy:    She has no cervical adenopathy.  Neurological: She is alert and oriented to person, place, and time. She has normal reflexes. No cranial nerve deficit.  Skin: Skin is warm and dry. No rash noted.  Psychiatric: Her mood appears anxious. Her speech is rapid and/or pressured. She is agitated.    RADIOLOGY DATA:  Reviewed in the EPIC EMR & discussed w/  the patient...  LABORATORY DATA:  Reviewed in the EPIC EMR & discussed w/ the patient...    Assessment & Plan:    Cig Smoker & CT Chest w/ early centrilobular emphysema>  This info really scared her but she is still smoking (now down to <1/4ppd she says); she filled the Chantix but is too scared to take it, afraid of the side effects (we reviewed the known side effects of smoking!)... NOTE> on repeat CT Chest the radiologist did not call emphysema & she is very pleased w/ this... F/u CXR 1/14 is clear...  Hx ASPIRATION PNEUMONITIS>  Prev episode resolved, lungs are clear, but she reports continued nocturnal regurg into her throat putting her at high risk for repeat asp episodes;  Rec return to GI for their review & active management of this difficult problem... Prev eval w/ CT Chest showed asp pneumonia & ?reactive adenopathy> repeat CT to re-eval the nodes showed resolved RUL infiltrate & decr size of nodes...  Hoarseness/ Globus/ ?VC polyps reported by the pt>  She will f/u at [facility] w/ GI-DrMadanick, and ENT-DrBuckmire...  GERD & Nocturnal regurg into the throat>  She will keep the Dexilant to Bid & f/u w/ GI to see what else they might recommend for this difficult problem...  HYPERCHOLESTEROLEMIA>  Continue diet + off meds from the Lipid Clinic (prev Zetia & Fenofibrate); offered referral to specialists in lipid disorders at [facility] but she wants to try ZETIA monotherapy first  => poor compliance w/ med refills per pharm & f/u FLP 11/14 was way off targets => refer to Lipid Clinic...  Hypothyroid>  Continue Synthroid daily & take it every day; needs f/u thyroid panel; she has seen DrEllison for Endocrine in the past...  Fibromyalgia>  She continues to depend upon Vicodin daily, I have encouraged her to follow up w/ Rheumatology or consider Pain Clinic referral...  Anxiety>  She remains on Wellbutrin, Celexa, Xanax; she was encouraged to follow up w/ her prev Psychiatrist to help w/ her nerves...   Patient's Medications  New Prescriptions   No medications on file  Previous Medications   ACYCLOVIR (ZOVIRAX) 400 MG TABLET    Take 1 tablet (400 mg total) by mouth every 4 (four) hours while awake.   ALPRAZOLAM (XANAX) 0.5 MG TABLET    TAKE ONE TABLET BY MOUTH 4 TIMES DAILY AS NEEDED (DO NOT EXCEED 4 TABLETS PER DAY AND NO EARLY REFILLS)   BUPROPION (WELLBUTRIN XL) 300 MG 24 HR TABLET    Take 1 tablet (300 mg total) by mouth daily.   CITALOPRAM (CELEXA) 40 MG TABLET    Take 1 tablet (40 mg total) by mouth daily.   DEXLANSOPRAZOLE (DEXILANT) 60 MG CAPSULE    Take 1 capsule (60 mg total) by mouth daily.   EZETIMIBE (ZETIA) 10 MG TABLET    Take 1 tablet (10 mg total) by mouth daily.   HYDROCODONE-ACETAMINOPHEN (NORCO/VICODIN) 5-325 MG PER TABLET    Take 1 tablet by mouth every 6 (six) hours as needed. Not to exceed 4 tablets a day   LEVOTHYROXINE (SYNTHROID, LEVOTHROID) 150 MCG TABLET    Take 1 tablet (150 mcg total) by mouth daily.   MECLIZINE (ANTIVERT) 25 MG TABLET    Take 1 tablet (25 mg total) by mouth every 4 (four) hours as needed for dizziness.   MELOXICAM (MOBIC) 15 MG TABLET    Take 1 tablet (15 mg total) by mouth daily.   PRAMIPEXOLE (MIRAPEX) 0.25 MG TABLET  2 tablets by mouth at bedtime   TRAMADOL (ULTRAM) 50 MG TABLET    Take 1 tablet (50 mg total) by mouth every 8 (eight) hours as needed for pain.  Modified Medications   No medications on file   Discontinued Medications   AMOXICILLIN-CLAVULANATE (AUGMENTIN) 875-125 MG PER TABLET    Take 1 tablet by mouth 2 (two) times daily.   FAMOTIDINE (PEPCID) 40 MG TABLET    Take 1 tablet (40 mg total) by mouth at bedtime as needed for heartburn.   FENOFIBRATE 54 MG TABLET    Take 1 tablet (54 mg total) by mouth daily.   FLUOCINONIDE-EMOLLIENT (LIDEX-E) 0.05 % CREAM    Apply to rash two times daily   GUAIFENESIN (MUCINEX) 600 MG 12 HR TABLET    Take 1,200 mg by mouth 2 (two) times daily.     METHOCARBAMOL (ROBAXIN) 500 MG TABLET    Take 1 tablet (500 mg total) by mouth 3 (three) times daily.   TEMAZEPAM (RESTORIL) 30 MG CAPSULE    Take 1 capsule (30 mg total) by mouth at bedtime as needed for sleep.

## 2012-12-19 NOTE — Patient Instructions (Addendum)
Today we updated your med list in our EPIC system...    Continue your current medications the same...  We refillled your meds per request...  Today we did your follow up CXR, EKG & FASTING blood work...    We will contact you w/ the results when available...   We will arrange for a HA management consult w/ [facility]...  For the diarrhea>>    Try the probiotic ALIGN one daily plus the Activia yogurt...  You need to establish w/ a GYN for pelvic exam, PAP smear, Mammogram, & bone density testing...  Call for any questions...  Let's plan a follow up visit in 18mo, sooner if needed for problems.Marland KitchenMarland Kitchen

## 2012-12-19 NOTE — Telephone Encounter (Signed)
Spoke with Pharmacist  He is asking why and if it is okay to fill the tramadol for the pt  He states that she she picked up a 30 day supply of norco on 12/11/12 and then brought in rx for tramadol today  Please advise if this is correct and okay to fill thanks  No Known Allergies

## 2012-12-20 ENCOUNTER — Other Ambulatory Visit: Payer: Self-pay | Admitting: Pulmonary Disease

## 2012-12-20 ENCOUNTER — Other Ambulatory Visit: Payer: Self-pay | Admitting: *Deleted

## 2012-12-20 DIAGNOSIS — K760 Fatty (change of) liver, not elsewhere classified: Secondary | ICD-10-CM

## 2012-12-20 LAB — VITAMIN D 25 HYDROXY (VIT D DEFICIENCY, FRACTURES): Vit D, 25-Hydroxy: 29 ng/mL — ABNORMAL LOW (ref 30–89)

## 2012-12-20 MED ORDER — ACYCLOVIR 200 MG PO CAPS: 400.0000 mg | ORAL_CAPSULE | ORAL | Status: DC | PRN

## 2012-12-20 NOTE — Telephone Encounter (Signed)
Spoke with pharmacist and gave the verbal order to fill Tramadol. Nothing further was needed.

## 2012-12-20 NOTE — Telephone Encounter (Signed)
Per SN: yes she can have both RX's thanks

## 2012-12-27 ENCOUNTER — Ambulatory Visit: Payer: BC Managed Care – PPO | Admitting: Pharmacist

## 2013-01-07 ENCOUNTER — Ambulatory Visit: Payer: BC Managed Care – PPO | Admitting: Pharmacist

## 2013-01-10 ENCOUNTER — Telehealth: Payer: Self-pay | Admitting: Pulmonary Disease

## 2013-01-10 MED ORDER — HYDROCODONE-ACETAMINOPHEN 5-325 MG PO TABS: 1.0000 | ORAL_TABLET | Freq: Four times a day (QID) | ORAL | Status: DC | PRN

## 2013-01-10 NOTE — Telephone Encounter (Signed)
rx has been printed out and placed on SN cart to be signed. 

## 2013-01-10 NOTE — Telephone Encounter (Signed)
rx has been signed by SN and pt has picked the rx up.

## 2013-01-15 ENCOUNTER — Encounter: Payer: Self-pay | Admitting: *Deleted

## 2013-01-15 LAB — HM DIABETES EYE EXAM

## 2013-02-05 ENCOUNTER — Telehealth: Payer: Self-pay | Admitting: Pulmonary Disease

## 2013-02-05 MED ORDER — AMOXICILLIN 500 MG PO CAPS: 500.0000 mg | ORAL_CAPSULE | Freq: Three times a day (TID) | ORAL | Status: DC

## 2013-02-05 NOTE — Telephone Encounter (Signed)
rx sent and pt is aware. Marshall Roehrich, CMA  

## 2013-02-05 NOTE — Telephone Encounter (Signed)
Per TP: okay to call in amoxicillin 500 mg TID #21 x o refills thanks

## 2013-02-05 NOTE — Telephone Encounter (Signed)
Pt states she tried to contact her dentist but they are closed today. She states she has a toothache and feels like it is infected and is requesting an abx. Please advise.Carron Curie, CMA No Known Allergies

## 2013-02-10 ENCOUNTER — Telehealth: Payer: Self-pay | Admitting: Pulmonary Disease

## 2013-02-10 MED ORDER — HYDROCODONE-ACETAMINOPHEN 5-325 MG PO TABS: 1.0000 | ORAL_TABLET | Freq: Four times a day (QID) | ORAL | Status: DC | PRN

## 2013-02-10 NOTE — Telephone Encounter (Signed)
Pt is now in lobby. Please inform pt of new procedure, she does not understand.

## 2013-02-10 NOTE — Telephone Encounter (Signed)
RX printed and SN has signed this. Per SN make pt aware she needs to start looking for new PCP as he is retiring this year so we can transition her care over. Pt is aware and nothing further needed

## 2013-03-12 ENCOUNTER — Telehealth: Payer: Self-pay | Admitting: Pulmonary Disease

## 2013-03-12 NOTE — Telephone Encounter (Signed)
Called and spoke with pt. Made her aware

## 2013-03-12 NOTE — Telephone Encounter (Signed)
Per SN---  We cannot refill this until tomorrow   03/13/13.  If you will send this back to me i can print this off tomorrow and call her when this is ready to be picked up.  thanks

## 2013-03-12 NOTE — Telephone Encounter (Signed)
Last OV 12-2012, last refill on 02-10-13 for hydrocodone #120 take 1 tablet every 6 hours as needed, not to exceed 4 per day. Please advise if ok to refill. Foss Bing, CMA

## 2013-03-13 MED ORDER — HYDROCODONE-ACETAMINOPHEN 5-325 MG PO TABS: 1.0000 | ORAL_TABLET | Freq: Four times a day (QID) | ORAL | Status: DC | PRN

## 2013-03-13 NOTE — Telephone Encounter (Signed)
Pt calling again in ref to previous msg says she's on her way to pick up rx can be reached at [phone].[name]

## 2013-03-13 NOTE — Telephone Encounter (Signed)
rx has been signed and placed up front for the pt to pick up.

## 2013-03-13 NOTE — Telephone Encounter (Signed)
I have let SN nurse Leigh know, pt is on her way up here to pick up Rx.  Rx is on SN cart to be signed.  PLease advise. thanks

## 2013-03-13 NOTE — Telephone Encounter (Signed)
rx has been printed out and placed on SN cart to be signed.  Will call the pt once this has been done.

## 2013-04-08 ENCOUNTER — Telehealth: Payer: Self-pay | Admitting: Pulmonary Disease

## 2013-04-08 DIAGNOSIS — F3289 Other specified depressive episodes: Secondary | ICD-10-CM

## 2013-04-08 DIAGNOSIS — E039 Hypothyroidism, unspecified: Secondary | ICD-10-CM

## 2013-04-08 DIAGNOSIS — E7800 Pure hypercholesterolemia, unspecified: Secondary | ICD-10-CM

## 2013-04-08 DIAGNOSIS — F329 Major depressive disorder, single episode, unspecified: Secondary | ICD-10-CM

## 2013-04-08 DIAGNOSIS — F411 Generalized anxiety disorder: Secondary | ICD-10-CM

## 2013-04-08 DIAGNOSIS — E785 Hyperlipidemia, unspecified: Secondary | ICD-10-CM

## 2013-04-08 MED ORDER — OXYCODONE-ACETAMINOPHEN 5-325 MG PO TABS: 1.0000 | ORAL_TABLET | Freq: Three times a day (TID) | ORAL | Status: DC | PRN

## 2013-04-08 NOTE — Telephone Encounter (Signed)
Per SN - Percocet 5 mg # 15 1 po tid prn - Needs new primary care referral

## 2013-04-08 NOTE — Telephone Encounter (Signed)
Spoke with pt. She had 5 teeth removed last Wednesday. The dentist will not give her pain medication since SN prescribes Vicodin. She is taking 2 Vicodin at time and it's not helping with the pain. Pt is requesting something stronger.  No Known Allergies  SN - please advise. Thanks.

## 2013-04-08 NOTE — Telephone Encounter (Signed)
Pt is aware of SN's recs. Rx will be printed and placed up front for pick up. She requests that we refer her to new PCP. Order will be placed.

## 2013-04-11 ENCOUNTER — Telehealth: Payer: Self-pay | Admitting: Pulmonary Disease

## 2013-04-11 NOTE — Telephone Encounter (Signed)
lmtcb x1 

## 2013-04-14 MED ORDER — HYDROCODONE-ACETAMINOPHEN 5-325 MG PO TABS: 1.0000 | ORAL_TABLET | Freq: Four times a day (QID) | ORAL | Status: DC | PRN

## 2013-04-14 NOTE — Telephone Encounter (Signed)
I called made pt aware. She is still waiting for appt to be made for PCP downstairs. We placed referral but she has not been called yet.

## 2013-04-14 NOTE — Telephone Encounter (Signed)
Per SN---  Ok to refill and this has been printed out and placed on his cart to be signed.   Pt can pick this up today at 4.   SN wanted to know who her new primary care doctor will be.  thanks

## 2013-04-14 NOTE — Telephone Encounter (Signed)
[id number]. Ohm Dentler, CMA  

## 2013-04-14 NOTE — Telephone Encounter (Signed)
Ok thanks,  rx is up front and ready to be picked up.

## 2013-04-14 NOTE — Telephone Encounter (Signed)
Called spoke with pt. She reports we gave her percocets d/t tooth pulled on 04/08/13 #15. She is now requesting a refill on her norco. This was last refilled 03/13/13 #120 x 0 refills Please advise SN thanks

## 2013-04-17 ENCOUNTER — Telehealth: Payer: Self-pay | Admitting: Pulmonary Disease

## 2013-04-17 MED ORDER — CLOTRIMAZOLE 10 MG MT TROC: 10.0000 mg | Freq: Every day | OROMUCOSAL | Status: DC

## 2013-04-17 NOTE — Telephone Encounter (Signed)
Called spoke with pt. She reports she had 5 teeth pulled 2 weeks ago today. She reports she has blisters on her tongue/mouth. It has white spots in the blisters. Kind of like thrush per pt. She reports her gums are swollen and painful. The blisters does not look like they are bleeding. The blisters are painful as well. Pt reports she has tried calling the dentist but no one answers. She wants recs. Please advise TP thanks  No Known Allergies   Current Outpatient Prescriptions on File Prior to Visit  Medication Sig Dispense Refill  . acyclovir (ZOVIRAX) 200 MG capsule Take 2 capsules (400 mg total) by mouth as needed.  60 capsule  5  . ALPRAZolam (XANAX) 0.5 MG tablet TAKE ONE TABLET BY MOUTH 4 TIMES DAILY AS NEEDED (DO NOT EXCEED 4 TABLETS PER DAY AND NO EARLY REFILLS)  120 tablet  0  . amoxicillin (AMOXIL) 500 MG capsule Take 1 capsule (500 mg total) by mouth 3 (three) times daily.  21 capsule  0  . buPROPion (WELLBUTRIN XL) 300 MG 24 hr tablet Take 1 tablet (300 mg total) by mouth daily.  30 tablet  6  . citalopram (CELEXA) 40 MG tablet Take 1 tablet (40 mg total) by mouth daily.  30 tablet  6  . dexlansoprazole (DEXILANT) 60 MG capsule Take 1 capsule (60 mg total) by mouth daily.  60 capsule  5  . ezetimibe (ZETIA) 10 MG tablet Take 1 tablet (10 mg total) by mouth daily.  30 tablet  11  . HYDROcodone-acetaminophen (NORCO/VICODIN) 5-325 MG per tablet Take 1 tablet by mouth every 6 (six) hours as needed. Not to exceed 4 tablets a day  120 tablet  0  . levothyroxine (SYNTHROID, LEVOTHROID) 150 MCG tablet Take 1 tablet (150 mcg total) by mouth daily.  30 tablet  6  . meclizine (ANTIVERT) 25 MG tablet Take 1 tablet (25 mg total) by mouth every 4 (four) hours as needed for dizziness.  30 tablet  6  . meloxicam (MOBIC) 15 MG tablet Take 1 tablet (15 mg total) by mouth daily.  30 tablet  6  . oxyCODONE-acetaminophen (PERCOCET) 5-325 MG per tablet Take 1 tablet by mouth 3 (three) times daily as needed for  severe pain.  15 tablet  0  . pramipexole (MIRAPEX) 0.25 MG tablet 2 tablets by mouth at bedtime  60 tablet  5  . traMADol (ULTRAM) 50 MG tablet Take 1 tablet (50 mg total) by mouth every 8 (eight) hours as needed.  90 tablet  5   No current facility-administered medications on file prior to visit.

## 2013-04-17 NOTE — Telephone Encounter (Signed)
Per TP: okay for Myecelex troche #35.  1 by mouth 5 times daily x1 week.  No refills.  Rx sent to pharmacy.  Called spoke with patient, advised of TP's recs as stated above.  Pt okay with this, verbalized her understanding and denied any questions.  Nothing further needed; will sign off.

## 2013-04-18 ENCOUNTER — Telehealth: Payer: Self-pay | Admitting: Pulmonary Disease

## 2013-04-18 MED ORDER — ALPRAZOLAM 0.5 MG PO TABS: ORAL_TABLET | ORAL | Status: DC

## 2013-04-18 NOTE — Telephone Encounter (Signed)
Pt requesting refill on alprazolam. Last refilled 12/19/12 #120 x 0 refills Last OV 12/19/12 Pending appt with Dr. Ronnald Ramp on 04/23/13 Please advise TP regarding refill? thanks

## 2013-04-18 NOTE — Telephone Encounter (Signed)
Per TP:  May have refill on the Alprazolam, #35 tablets.  Thanks.

## 2013-04-18 NOTE — Telephone Encounter (Signed)
I have called RX into [facility] for pt. Pt aware and needed nothing further needed

## 2013-04-22 ENCOUNTER — Encounter: Payer: Self-pay | Admitting: Internal Medicine

## 2013-04-22 ENCOUNTER — Ambulatory Visit (INDEPENDENT_AMBULATORY_CARE_PROVIDER_SITE_OTHER): Payer: BC Managed Care – PPO

## 2013-04-22 ENCOUNTER — Ambulatory Visit (INDEPENDENT_AMBULATORY_CARE_PROVIDER_SITE_OTHER): Payer: BC Managed Care – PPO | Admitting: Internal Medicine

## 2013-04-22 VITALS — BP 110/80 | HR 64 | Temp 98.1°F | Resp 16 | Ht 67.0 in | Wt 174.0 lb

## 2013-04-22 DIAGNOSIS — E559 Vitamin D deficiency, unspecified: Secondary | ICD-10-CM

## 2013-04-22 DIAGNOSIS — F418 Other specified anxiety disorders: Secondary | ICD-10-CM

## 2013-04-22 DIAGNOSIS — K7689 Other specified diseases of liver: Secondary | ICD-10-CM | POA: Diagnosis not present

## 2013-04-22 DIAGNOSIS — K589 Irritable bowel syndrome without diarrhea: Secondary | ICD-10-CM

## 2013-04-22 DIAGNOSIS — K760 Fatty (change of) liver, not elsewhere classified: Secondary | ICD-10-CM

## 2013-04-22 DIAGNOSIS — E7800 Pure hypercholesterolemia, unspecified: Secondary | ICD-10-CM

## 2013-04-22 DIAGNOSIS — IMO0001 Reserved for inherently not codable concepts without codable children: Secondary | ICD-10-CM | POA: Diagnosis not present

## 2013-04-22 DIAGNOSIS — F341 Dysthymic disorder: Secondary | ICD-10-CM

## 2013-04-22 DIAGNOSIS — Z1231 Encounter for screening mammogram for malignant neoplasm of breast: Secondary | ICD-10-CM

## 2013-04-22 DIAGNOSIS — E039 Hypothyroidism, unspecified: Secondary | ICD-10-CM

## 2013-04-22 LAB — COMPREHENSIVE METABOLIC PANEL
ALT: 29 U/L (ref 0–35)
AST: 30 U/L (ref 0–37)
Albumin: 4.5 g/dL (ref 3.5–5.2)
Alkaline Phosphatase: 69 U/L (ref 39–117)
BUN: 9 mg/dL (ref 6–23)
CO2: 28 mEq/L (ref 19–32)
Calcium: 9.5 mg/dL (ref 8.4–10.5)
Chloride: 103 mEq/L (ref 96–112)
Creatinine, Ser: 0.9 mg/dL (ref 0.4–1.2)
GFR: 65.94 mL/min (ref >60.00)
Glucose, Bld: 126 mg/dL — ABNORMAL HIGH (ref 70–99)
Potassium: 4 mEq/L (ref 3.5–5.1)
Sodium: 140 mEq/L (ref 135–145)
Total Bilirubin: 0.5 mg/dL (ref 0.3–1.2)
Total Protein: 7.7 g/dL (ref 6.0–8.3)

## 2013-04-22 LAB — CBC WITH DIFFERENTIAL/PLATELET
Basophils Absolute: 0 10*3/uL (ref 0.0–0.1)
Basophils Relative: 0.4 % (ref 0.0–3.0)
Eosinophils Absolute: 0 10*3/uL (ref 0.0–0.7)
Eosinophils Relative: 0.1 % (ref 0.0–5.0)
HCT: 43.6 % (ref 36.0–46.0)
Hemoglobin: 14.6 g/dL (ref 12.0–15.0)
Lymphocytes Relative: 41.4 % (ref 12.0–46.0)
Lymphs Abs: 3.7 10*3/uL (ref 0.7–4.0)
MCHC: 33.4 g/dL (ref 30.0–36.0)
MCV: 97 fl (ref 78.0–100.0)
Monocytes Absolute: 0.4 10*3/uL (ref 0.1–1.0)
Monocytes Relative: 4.4 % (ref 3.0–12.0)
Neutro Abs: 4.8 10*3/uL (ref 1.4–7.7)
Neutrophils Relative %: 53.7 % (ref 43.0–77.0)
Platelets: 219 10*3/uL (ref 150.0–400.0)
RBC: 4.49 Mil/uL (ref 3.87–5.11)
RDW: 13.9 % (ref 11.5–14.6)
WBC: 8.9 10*3/uL (ref 4.5–10.5)

## 2013-04-22 LAB — URINALYSIS, ROUTINE W REFLEX MICROSCOPIC
Bilirubin Urine: NEGATIVE
Hgb urine dipstick: NEGATIVE
Ketones, ur: NEGATIVE
Leukocytes, UA: NEGATIVE
Nitrite: NEGATIVE
Specific Gravity, Urine: 1.025 (ref 1.000–1.030)
Total Protein, Urine: NEGATIVE
Urine Glucose: NEGATIVE
Urobilinogen, UA: 0.2 (ref 0.0–1.0)
pH: 5.5 (ref 5.0–8.0)

## 2013-04-22 LAB — TSH: TSH: 19.65 u[IU]/mL — ABNORMAL HIGH (ref 0.35–5.50)

## 2013-04-22 MED ORDER — HYDROCODONE-ACETAMINOPHEN 5-325 MG PO TABS: 1.0000 | ORAL_TABLET | Freq: Four times a day (QID) | ORAL | Status: DC | PRN

## 2013-04-22 MED ORDER — ALPRAZOLAM 0.5 MG PO TABS: ORAL_TABLET | ORAL | Status: DC

## 2013-04-22 MED ORDER — LEVOTHYROXINE SODIUM 200 MCG PO TABS: 200.0000 ug | ORAL_TABLET | Freq: Every day | ORAL | Status: DC

## 2013-04-22 MED ORDER — ROSUVASTATIN CALCIUM 20 MG PO TABS: 20.0000 mg | ORAL_TABLET | Freq: Every day | ORAL | Status: DC

## 2013-04-22 MED ORDER — VILAZODONE HCL 10 & 20 & 40 MG PO KIT: 1.0000 | PACK | Freq: Every day | ORAL | Status: DC

## 2013-04-22 NOTE — Assessment & Plan Note (Signed)
I will check her Vit D level today She will cont norco as needed

## 2013-04-22 NOTE — Progress Notes (Signed)
Subjective:    Patient ID: Tasha [name], female    DOB: 10-Mar-1960, 53 y.o.   MRN: [id number]  Thyroid Problem Presents for follow-up visit. Symptoms include anxiety, depressed mood and fatigue. Patient reports no cold intolerance, constipation, diaphoresis, diarrhea, dry skin, hair loss, heat intolerance, hoarse voice, leg swelling, menstrual problem, nail problem, palpitations, tremors, visual change, weight gain or weight loss. The symptoms have been stable. Past treatments include levothyroxine. The treatment provided moderate relief.      Review of Systems  Constitutional: Positive for fatigue. Negative for fever, chills, weight loss, weight gain, diaphoresis, appetite change and unexpected weight change.  HENT: Negative.  Negative for hoarse voice.   Eyes: Negative.   Respiratory: Negative.  Negative for cough, choking, chest tightness, shortness of breath and stridor.   Cardiovascular: Negative.  Negative for chest pain, palpitations and leg swelling.  Gastrointestinal: Negative.  Negative for nausea, vomiting, abdominal pain, diarrhea and constipation.  Endocrine: Negative.  Negative for cold intolerance and heat intolerance.  Genitourinary: Negative.  Negative for menstrual problem.  Musculoskeletal: Positive for arthralgias and myalgias. Negative for back pain, gait problem, joint swelling, neck pain and neck stiffness.  Skin: Negative.   Allergic/Immunologic: Negative.   Neurological: Negative.  Negative for tremors.  Hematological: Negative.  Negative for adenopathy. Does not bruise/bleed easily.  Psychiatric/Behavioral: Positive for sleep disturbance and dysphoric mood. Negative for suicidal ideas, hallucinations, behavioral problems, confusion, self-injury, decreased concentration and agitation. The patient is nervous/anxious. The patient is not hyperactive.        She complains of a sad mood, crying spells, anhedonia, insomnia, anxiety       Objective:   Physical  Exam  Constitutional: She is oriented to person, place, and time. She appears well-developed and well-nourished. No distress.  HENT:  Head: Normocephalic and atraumatic.  Mouth/Throat: Oropharynx is clear and moist. No oropharyngeal exudate.  Eyes: Conjunctivae are normal. Right eye exhibits no discharge. Left eye exhibits no discharge. No scleral icterus.  Neck: Normal range of motion. Neck supple. No JVD present. No tracheal deviation present. No thyromegaly present.  Cardiovascular: Normal rate, regular rhythm, normal heart sounds and intact distal pulses.  Exam reveals no gallop and no friction rub.   No murmur heard. Pulmonary/Chest: Effort normal and breath sounds normal. No stridor. No respiratory distress. She has no wheezes. She has no rales. She exhibits no tenderness.  Abdominal: Soft. Bowel sounds are normal. She exhibits no distension and no mass. There is no tenderness. There is no rebound and no guarding.  Musculoskeletal: Normal range of motion. She exhibits no edema and no tenderness.  Lymphadenopathy:    She has no cervical adenopathy.  Neurological: She is oriented to person, place, and time.  Skin: Skin is warm and dry. No rash noted. She is not diaphoretic. No erythema. No pallor.  Psychiatric: Her speech is normal and behavior is normal. Judgment and thought content normal. Her mood appears not anxious. Her affect is not angry, not blunt, not labile and not inappropriate. Cognition and memory are normal. She exhibits a depressed mood. She expresses no homicidal and no suicidal ideation. She expresses no suicidal plans and no homicidal plans.     Lab Results  Component Value Date   WBC 8.5 12/19/2012   HGB 13.8 12/19/2012   HCT 40.8 12/19/2012   PLT 190.0 12/19/2012   GLUCOSE 101* 12/19/2012   CHOL 290* 12/19/2012   TRIG 214.0* 12/19/2012   HDL 31.40* 12/19/2012   LDLDIRECT 217.7 12/19/2012  ALT 51* 12/19/2012   AST 42* 12/19/2012   NA 138 12/19/2012   K 4.0  12/19/2012   CL 104 12/19/2012   CREATININE 0.9 12/19/2012   BUN 16 12/19/2012   CO2 28 12/19/2012   TSH 29.39* 04/05/2010       Assessment & Plan:

## 2013-04-22 NOTE — Patient Instructions (Signed)

## 2013-04-22 NOTE — Assessment & Plan Note (Addendum)
I will recheck her TSH and will adjust her dose if needed  Late note - her TSH is nearly 20 so will increase the synthroid dose

## 2013-04-22 NOTE — Assessment & Plan Note (Addendum)
She will cont xanax as needed, I have asked her to stay on wellbutrin I think Viibryd would be a better option for her than celexa so I have asked her to taper off of celexa and to start layering Viibyrd over the celexa I will check her UDS to see if she is compliant with current meds and to screen for substance abuse

## 2013-04-22 NOTE — Progress Notes (Signed)
Pre visit review using our clinic review tool, if applicable. No additional management support is needed unless otherwise documented below in the visit note. 

## 2013-04-22 NOTE — Assessment & Plan Note (Signed)
She will not take a statin due to muscle aches with prior statin therapy

## 2013-04-22 NOTE — Assessment & Plan Note (Signed)
I will recheck her LFT's and will screen her for Hep C

## 2013-04-23 ENCOUNTER — Encounter: Payer: Self-pay | Admitting: Internal Medicine

## 2013-04-23 ENCOUNTER — Ambulatory Visit: Payer: BC Managed Care – PPO | Admitting: Internal Medicine

## 2013-04-23 DIAGNOSIS — E559 Vitamin D deficiency, unspecified: Secondary | ICD-10-CM | POA: Insufficient documentation

## 2013-04-23 LAB — VITAMIN D 25 HYDROXY (VIT D DEFICIENCY, FRACTURES): Vit D, 25-Hydroxy: 22 ng/mL — ABNORMAL LOW (ref 30–89)

## 2013-04-23 LAB — HEPATITIS C ANTIBODY: HCV Ab: NEGATIVE

## 2013-04-23 MED ORDER — CHOLECALCIFEROL 1.25 MG (50000 UT) PO TABS: 1.0000 | ORAL_TABLET | ORAL | Status: DC

## 2013-04-23 NOTE — Addendum Note (Signed)
Addended by: Janith Lima on: 04/23/2013 07:35 AM   Modules accepted: Orders

## 2013-04-24 LAB — DRUGS OF ABUSE SCREEN W/O ALC, ROUTINE URINE
Amphetamine Screen, Ur: NEGATIVE
Barbiturate Quant, Ur: NEGATIVE
Benzodiazepines.: POSITIVE — AB
Cocaine Metabolites: NEGATIVE
Creatinine,U: 214.4 mg/dL
Marijuana Metabolite: NEGATIVE
Methadone: NEGATIVE
Opiate Screen, Urine: NEGATIVE
Phencyclidine (PCP): NEGATIVE
Propoxyphene: NEGATIVE

## 2013-04-24 LAB — OXYCODONE SCREEN, UA, RFLX CONFIRM: Oxycodone Screen, Ur: NEGATIVE ng/mL

## 2013-04-28 LAB — BENZODIAZEPINES (GC/LC/MS), URINE
Alprazolam (GC/LC/MS), ur confirm: 178 ng/mL — ABNORMAL HIGH
Alprazolam metabolite (GC/LC/MS), ur confirm: 193 ng/mL — ABNORMAL HIGH
Clonazepam metabolite (GC/LC/MS), ur confirm: NEGATIVE ng/mL
Diazepam (GC/LC/MS), ur confirm: NEGATIVE ng/mL
Estazolam (GC/LC/MS), ur confirm: NEGATIVE ng/mL
Flunitrazepam metabolite (GC/LC/MS), ur confirm: NEGATIVE ng/mL
Flurazepam metabolite (GC/LC/MS), ur confirm: NEGATIVE ng/mL
Lorazepam (GC/LC/MS), ur confirm: NEGATIVE ng/mL
Midazolam (GC/LC/MS), ur confirm: NEGATIVE ng/mL
Nordiazepam (GC/LC/MS), ur confirm: NEGATIVE ng/mL
Oxazepam (GC/LC/MS), ur confirm: NEGATIVE ng/mL
Temazepam (GC/LC/MS), ur confirm: NEGATIVE ng/mL
Triazolam metabolite (GC/LC/MS), ur confirm: NEGATIVE ng/mL

## 2013-05-01 ENCOUNTER — Telehealth: Payer: Self-pay | Admitting: Internal Medicine

## 2013-05-01 DIAGNOSIS — N6310 Unspecified lump in the right breast, unspecified quadrant: Secondary | ICD-10-CM

## 2013-05-01 DIAGNOSIS — N6320 Unspecified lump in the left breast, unspecified quadrant: Secondary | ICD-10-CM

## 2013-05-01 NOTE — Addendum Note (Signed)
Addended by: Estell Harpin T on: 05/01/2013 11:06 AM   Modules accepted: Orders

## 2013-05-01 NOTE — Telephone Encounter (Signed)
Dr Ronnald Ramp, patient mammogram diagnosis need to be changed to lump in both breast. Also need orders for right breast ultrasound (img 5532) and left breast ultrasound (img 5531).   Thanks

## 2013-05-04 NOTE — Telephone Encounter (Signed)
done

## 2013-05-15 ENCOUNTER — Inpatient Hospital Stay: Admission: RE | Admit: 2013-05-15 | Payer: BC Managed Care – PPO | Source: Ambulatory Visit

## 2013-05-15 ENCOUNTER — Other Ambulatory Visit: Payer: BC Managed Care – PPO

## 2013-05-20 ENCOUNTER — Ambulatory Visit (INDEPENDENT_AMBULATORY_CARE_PROVIDER_SITE_OTHER): Payer: BC Managed Care – PPO | Admitting: Internal Medicine

## 2013-05-20 ENCOUNTER — Encounter: Payer: Self-pay | Admitting: Internal Medicine

## 2013-05-20 VITALS — BP 118/64 | HR 77 | Temp 98.0°F | Resp 16 | Ht 67.0 in | Wt 173.4 lb

## 2013-05-20 DIAGNOSIS — F17200 Nicotine dependence, unspecified, uncomplicated: Secondary | ICD-10-CM

## 2013-05-20 DIAGNOSIS — E785 Hyperlipidemia, unspecified: Secondary | ICD-10-CM | POA: Diagnosis not present

## 2013-05-20 DIAGNOSIS — F341 Dysthymic disorder: Secondary | ICD-10-CM | POA: Diagnosis not present

## 2013-05-20 DIAGNOSIS — K219 Gastro-esophageal reflux disease without esophagitis: Secondary | ICD-10-CM | POA: Diagnosis not present

## 2013-05-20 DIAGNOSIS — E2839 Other primary ovarian failure: Secondary | ICD-10-CM | POA: Insufficient documentation

## 2013-05-20 DIAGNOSIS — F418 Other specified anxiety disorders: Secondary | ICD-10-CM

## 2013-05-20 DIAGNOSIS — M8580 Other specified disorders of bone density and structure, unspecified site: Secondary | ICD-10-CM

## 2013-05-20 DIAGNOSIS — M899 Disorder of bone, unspecified: Secondary | ICD-10-CM

## 2013-05-20 DIAGNOSIS — M949 Disorder of cartilage, unspecified: Secondary | ICD-10-CM

## 2013-05-20 DIAGNOSIS — E039 Hypothyroidism, unspecified: Secondary | ICD-10-CM | POA: Diagnosis not present

## 2013-05-20 DIAGNOSIS — E663 Overweight: Secondary | ICD-10-CM

## 2013-05-20 DIAGNOSIS — G4761 Periodic limb movement disorder: Secondary | ICD-10-CM

## 2013-05-20 MED ORDER — SERTRALINE HCL 100 MG PO TABS: 100.0000 mg | ORAL_TABLET | Freq: Every day | ORAL | Status: DC

## 2013-05-20 MED ORDER — EZETIMIBE 10 MG PO TABS: 10.0000 mg | ORAL_TABLET | Freq: Every day | ORAL | Status: DC

## 2013-05-20 MED ORDER — OMEPRAZOLE 40 MG PO CPDR: 40.0000 mg | DELAYED_RELEASE_CAPSULE | Freq: Every day | ORAL | Status: DC

## 2013-05-20 MED ORDER — VILAZODONE HCL 40 MG PO TABS: 40.0000 mg | ORAL_TABLET | Freq: Every day | ORAL | Status: DC

## 2013-05-20 NOTE — Progress Notes (Signed)
Subjective:    Patient ID: Tasha [name], female    DOB: 1960/07/04, 53 y.o.   MRN: [id number]  Thyroid Problem Symptoms include anxiety, depressed mood and fatigue. Patient reports no cold intolerance, constipation, diaphoresis, diarrhea, dry skin, hair loss, heat intolerance, hoarse voice, leg swelling, nail problem, palpitations, tremors, visual change, weight gain or weight loss. The symptoms have been stable. Past treatments include levothyroxine. The treatment provided moderate relief.      Review of Systems  Constitutional: Positive for fatigue. Negative for fever, chills, weight loss, weight gain, diaphoresis and appetite change.  HENT: Negative.  Negative for hoarse voice.   Eyes: Negative.   Respiratory: Negative.  Negative for cough, choking, chest tightness, shortness of breath, wheezing and stridor.   Cardiovascular: Negative.  Negative for chest pain, palpitations and leg swelling.  Gastrointestinal: Negative.  Negative for nausea, vomiting, diarrhea, constipation, abdominal distention and anal bleeding.       Dexilant helps with heartburn but it is too expensive, she wants a different PPI  Endocrine: Negative.  Negative for cold intolerance and heat intolerance.  Genitourinary: Negative.  Negative for dysuria and difficulty urinating.  Musculoskeletal: Positive for myalgias. Negative for arthralgias, back pain, gait problem, joint swelling, neck pain and neck stiffness.  Skin: Negative.   Allergic/Immunologic: Negative.   Neurological: Negative.  Negative for dizziness, tremors, speech difficulty, weakness, light-headedness, numbness and headaches.  Hematological: Negative.  Negative for adenopathy. Does not bruise/bleed easily.  Psychiatric/Behavioral: Positive for sleep disturbance and dysphoric mood. Negative for suicidal ideas, hallucinations, behavioral problems, confusion, self-injury, decreased concentration and agitation. The patient is nervous/anxious. The  patient is not hyperactive.        She still has anxiety and panic intermittently and she likes viibryd but it is too expensive so she wants a different antidepressant       Objective:   Physical Exam  Vitals reviewed. Constitutional: She is oriented to person, place, and time. She appears well-developed and well-nourished. No distress.  HENT:  Head: Normocephalic and atraumatic.  Mouth/Throat: Oropharynx is clear and moist. No oropharyngeal exudate.  Eyes: Conjunctivae are normal. Right eye exhibits no discharge. Left eye exhibits no discharge. No scleral icterus.  Neck: Normal range of motion. Neck supple. No JVD present. No tracheal deviation present. No thyromegaly present.  Cardiovascular: Normal rate, regular rhythm, normal heart sounds and intact distal pulses.  Exam reveals no gallop and no friction rub.   No murmur heard. Pulmonary/Chest: Effort normal and breath sounds normal. No stridor. No respiratory distress. She has no wheezes. She has no rales. She exhibits no tenderness.  Abdominal: Soft. Bowel sounds are normal. She exhibits no distension and no mass. There is no tenderness. There is no rebound and no guarding.  Musculoskeletal: Normal range of motion. She exhibits no edema and no tenderness.  Lymphadenopathy:    She has no cervical adenopathy.  Neurological: She is oriented to person, place, and time.  Skin: Skin is warm and dry. No rash noted. She is not diaphoretic. No erythema. No pallor.  Psychiatric: She has a normal mood and affect. Her behavior is normal. Judgment and thought content normal.    Lab Results  Component Value Date   WBC 8.9 04/22/2013   HGB 14.6 04/22/2013   HCT 43.6 04/22/2013   PLT 219.0 04/22/2013   GLUCOSE 126* 04/22/2013   CHOL 290* 12/19/2012   TRIG 214.0* 12/19/2012   HDL 31.40* 12/19/2012   LDLDIRECT 217.7 12/19/2012   ALT 29 04/22/2013  AST 30 04/22/2013   NA 140 04/22/2013   K 4.0 04/22/2013   CL 103 04/22/2013   CREATININE 0.9  04/22/2013   BUN 9 04/22/2013   CO2 28 04/22/2013   TSH 19.65* 04/22/2013        Assessment & Plan:

## 2013-05-20 NOTE — Patient Instructions (Signed)

## 2013-05-20 NOTE — Progress Notes (Signed)
Pre visit review using our clinic review tool, if applicable. No additional management support is needed unless otherwise documented below in the visit note. 

## 2013-05-21 NOTE — Assessment & Plan Note (Signed)
I will recheck her TSH today and will adjust her dose if needed 

## 2013-05-21 NOTE — Assessment & Plan Note (Signed)
PPI changed at her request

## 2013-05-21 NOTE — Assessment & Plan Note (Signed)
She wants to have a DEXA scan done

## 2013-05-21 NOTE — Assessment & Plan Note (Signed)
She will try zetia

## 2013-05-21 NOTE — Assessment & Plan Note (Signed)
Stop viibryd at her request Start sertraline

## 2013-05-26 ENCOUNTER — Encounter: Payer: Self-pay | Admitting: Internal Medicine

## 2013-05-29 ENCOUNTER — Ambulatory Visit
Admission: RE | Admit: 2013-05-29 | Discharge: 2013-05-29 | Disposition: A | Discharge status: Home / Self Care | Payer: BC Managed Care – PPO | Source: Ambulatory Visit | Attending: Internal Medicine | Admitting: Internal Medicine

## 2013-05-29 DIAGNOSIS — N6320 Unspecified lump in the left breast, unspecified quadrant: Secondary | ICD-10-CM

## 2013-05-29 DIAGNOSIS — M899 Disorder of bone, unspecified: Secondary | ICD-10-CM | POA: Diagnosis not present

## 2013-05-29 DIAGNOSIS — N6310 Unspecified lump in the right breast, unspecified quadrant: Secondary | ICD-10-CM

## 2013-05-29 DIAGNOSIS — R928 Other abnormal and inconclusive findings on diagnostic imaging of breast: Secondary | ICD-10-CM | POA: Diagnosis not present

## 2013-05-29 DIAGNOSIS — M949 Disorder of cartilage, unspecified: Secondary | ICD-10-CM | POA: Diagnosis not present

## 2013-05-29 DIAGNOSIS — Z803 Family history of malignant neoplasm of breast: Secondary | ICD-10-CM | POA: Diagnosis not present

## 2013-05-29 DIAGNOSIS — M8580 Other specified disorders of bone density and structure, unspecified site: Secondary | ICD-10-CM

## 2013-05-29 LAB — HM MAMMOGRAPHY: HM Mammogram: NORMAL

## 2013-05-30 ENCOUNTER — Encounter: Payer: Self-pay | Admitting: Internal Medicine

## 2013-05-30 LAB — HM DEXA SCAN

## 2013-06-11 ENCOUNTER — Encounter: Payer: Self-pay | Admitting: Internal Medicine

## 2013-06-13 ENCOUNTER — Telehealth: Payer: Self-pay | Admitting: Internal Medicine

## 2013-06-13 DIAGNOSIS — IMO0001 Reserved for inherently not codable concepts without codable children: Secondary | ICD-10-CM

## 2013-06-13 MED ORDER — HYDROCODONE-ACETAMINOPHEN 5-325 MG PO TABS: 1.0000 | ORAL_TABLET | Freq: Four times a day (QID) | ORAL | Status: DC | PRN

## 2013-06-13 NOTE — Telephone Encounter (Signed)
Patient is calling to request a new rx for her HYDROcodone-acetaminophen (NORCO/VICODIN) 5-325 MG. Please advise.

## 2013-06-24 ENCOUNTER — Encounter: Payer: Self-pay | Admitting: Internal Medicine

## 2013-07-16 ENCOUNTER — Other Ambulatory Visit: Payer: Self-pay

## 2013-07-16 DIAGNOSIS — IMO0001 Reserved for inherently not codable concepts without codable children: Secondary | ICD-10-CM

## 2013-07-16 MED ORDER — HYDROCODONE-ACETAMINOPHEN 5-325 MG PO TABS: 1.0000 | ORAL_TABLET | Freq: Four times a day (QID) | ORAL | Status: DC | PRN

## 2013-07-16 NOTE — Telephone Encounter (Signed)
Pt called to get a refill on her hydrocodone 5-325.  rx printed and awaiting sig from dr. Ronnald Ramp

## 2013-07-16 NOTE — Telephone Encounter (Signed)
Pt notified, and rx is ready for pick up

## 2013-07-23 ENCOUNTER — Other Ambulatory Visit: Payer: Self-pay | Admitting: Internal Medicine

## 2013-09-02 ENCOUNTER — Telehealth: Payer: Self-pay | Admitting: Internal Medicine

## 2013-09-02 NOTE — Telephone Encounter (Signed)
Patient is request script for hydrocodone and a refill of xanax.

## 2013-09-03 ENCOUNTER — Other Ambulatory Visit: Payer: Self-pay | Admitting: Internal Medicine

## 2013-09-03 ENCOUNTER — Telehealth: Payer: Self-pay | Admitting: Internal Medicine

## 2013-09-03 DIAGNOSIS — IMO0001 Reserved for inherently not codable concepts without codable children: Secondary | ICD-10-CM

## 2013-09-03 MED ORDER — ALPRAZOLAM 0.5 MG PO TABS: 0.5000 mg | ORAL_TABLET | Freq: Three times a day (TID) | ORAL | Status: DC | PRN

## 2013-09-03 MED ORDER — HYDROCODONE-ACETAMINOPHEN 5-325 MG PO TABS: 1.0000 | ORAL_TABLET | Freq: Four times a day (QID) | ORAL | Status: DC | PRN

## 2013-09-03 NOTE — Telephone Encounter (Signed)
Please correct this

## 2013-09-03 NOTE — Telephone Encounter (Signed)
Patient states that her Xanax needs to be re-written from "Take 1 tablet (0.5 mg total) by mouth 3 (three) times daily" to take "4 (four) times daily." She says that this is how it is usually written and her insurance will not cover the way it is currently written. Please advise. Per pt, [facility] says we can fax corrected script to them.

## 2013-09-03 NOTE — Telephone Encounter (Signed)
Must be seen every three months for controls, please schedule appt.

## 2013-09-03 NOTE — Telephone Encounter (Signed)
Dr. Ronnald Ramp ok refill, pt pick up Rx and schedule for 09/11/13.

## 2013-09-05 MED ORDER — ALPRAZOLAM 0.5 MG PO TABS: 0.5000 mg | ORAL_TABLET | Freq: Four times a day (QID) | ORAL | Status: DC | PRN

## 2013-09-05 NOTE — Telephone Encounter (Signed)
RX corrected and called in to pharmacy.

## 2013-09-11 ENCOUNTER — Encounter: Payer: Self-pay | Admitting: Internal Medicine

## 2013-09-11 ENCOUNTER — Ambulatory Visit (INDEPENDENT_AMBULATORY_CARE_PROVIDER_SITE_OTHER): Payer: BC Managed Care – PPO | Admitting: Internal Medicine

## 2013-09-11 ENCOUNTER — Other Ambulatory Visit (INDEPENDENT_AMBULATORY_CARE_PROVIDER_SITE_OTHER): Payer: BC Managed Care – PPO

## 2013-09-11 VITALS — BP 105/62 | HR 66 | Temp 98.2°F | Resp 16 | Ht 67.0 in | Wt 171.0 lb

## 2013-09-11 DIAGNOSIS — IMO0001 Reserved for inherently not codable concepts without codable children: Secondary | ICD-10-CM | POA: Diagnosis not present

## 2013-09-11 DIAGNOSIS — E118 Type 2 diabetes mellitus with unspecified complications: Secondary | ICD-10-CM | POA: Insufficient documentation

## 2013-09-11 DIAGNOSIS — R7309 Other abnormal glucose: Secondary | ICD-10-CM

## 2013-09-11 DIAGNOSIS — E039 Hypothyroidism, unspecified: Secondary | ICD-10-CM

## 2013-09-11 DIAGNOSIS — E785 Hyperlipidemia, unspecified: Secondary | ICD-10-CM

## 2013-09-11 DIAGNOSIS — F341 Dysthymic disorder: Secondary | ICD-10-CM

## 2013-09-11 DIAGNOSIS — R9431 Abnormal electrocardiogram [ECG] [EKG]: Secondary | ICD-10-CM

## 2013-09-11 DIAGNOSIS — F418 Other specified anxiety disorders: Secondary | ICD-10-CM

## 2013-09-11 LAB — BASIC METABOLIC PANEL
BUN: 12 mg/dL (ref 6–23)
CO2: 26 mEq/L (ref 19–32)
Calcium: 9.6 mg/dL (ref 8.4–10.5)
Chloride: 103 mEq/L (ref 96–112)
Creatinine, Ser: 0.8 mg/dL (ref 0.4–1.2)
GFR: 76.01 mL/min (ref >60.00)
Glucose, Bld: 159 mg/dL — ABNORMAL HIGH (ref 70–99)
Potassium: 3.6 mEq/L (ref 3.5–5.1)
Sodium: 138 mEq/L (ref 135–145)

## 2013-09-11 LAB — LIPID PANEL
Cholesterol: 280 mg/dL — ABNORMAL HIGH (ref 0–200)
HDL: 37.5 mg/dL — ABNORMAL LOW (ref >39.00)
NonHDL: 242.5
Total CHOL/HDL Ratio: 7
Triglycerides: 340 mg/dL — ABNORMAL HIGH (ref 0.0–149.0)
VLDL: 68 mg/dL — ABNORMAL HIGH (ref 0.0–40.0)

## 2013-09-11 LAB — LDL CHOLESTEROL, DIRECT: Direct LDL: 217.1 mg/dL

## 2013-09-11 LAB — TSH: TSH: 13.38 u[IU]/mL — ABNORMAL HIGH (ref 0.35–4.50)

## 2013-09-11 LAB — HEMOGLOBIN A1C: Hgb A1c MFr Bld: 6.6 % — ABNORMAL HIGH (ref 4.6–6.5)

## 2013-09-11 MED ORDER — HYDROCODONE-ACETAMINOPHEN 5-325 MG PO TABS: 1.0000 | ORAL_TABLET | Freq: Four times a day (QID) | ORAL | Status: DC | PRN

## 2013-09-11 MED ORDER — ALPRAZOLAM 0.5 MG PO TABS: 0.5000 mg | ORAL_TABLET | Freq: Four times a day (QID) | ORAL | Status: DC | PRN

## 2013-09-11 NOTE — Patient Instructions (Signed)
Hypothyroidism The thyroid is a large gland located in the lower front of your neck. The thyroid gland helps control metabolism. Metabolism is how your body handles food. It controls metabolism with the hormone thyroxine. When this gland is underactive (hypothyroid), it produces too little hormone.  CAUSES These include:   Absence or destruction of thyroid tissue.  Goiter due to iodine deficiency.  Goiter due to medications.  Congenital defects (since birth).  Problems with the pituitary. This causes a lack of TSH (thyroid stimulating hormone). This hormone tells the thyroid to turn out more hormone. SYMPTOMS  Lethargy (feeling as though you have no energy)  Cold intolerance  Weight gain (in spite of normal food intake)  Dry skin  Coarse hair  Menstrual irregularity (if severe, may lead to infertility)  Slowing of thought processes Cardiac problems are also caused by insufficient amounts of thyroid hormone. Hypothyroidism in the newborn is cretinism, and is an extreme form. It is important that this form be treated adequately and immediately or it will lead rapidly to retarded physical and mental development. DIAGNOSIS  To prove hypothyroidism, your caregiver may do blood tests and ultrasound tests. Sometimes the signs are hidden. It may be necessary for your caregiver to watch this illness with blood tests either before or after diagnosis and treatment. TREATMENT  Low levels of thyroid hormone are increased by using synthetic thyroid hormone. This is a safe, effective treatment. It usually takes about four weeks to gain the full effects of the medication. After you have the full effect of the medication, it will generally take another four weeks for problems to leave. Your caregiver may start you on low doses. If you have had heart problems the dose may be gradually increased. It is generally not an emergency to get rapidly to normal. HOME CARE INSTRUCTIONS   Take your  medications as your caregiver suggests. Let your caregiver know of any medications you are taking or start taking. Your caregiver will help you with dosage schedules.  As your condition improves, your dosage needs may increase. It will be necessary to have continuing blood tests as suggested by your caregiver.  Report all suspected medication side effects to your caregiver. SEEK MEDICAL CARE IF: Seek medical care if you develop:  Sweating.  Tremulousness (tremors).  Anxiety.  Rapid weight loss.  Heat intolerance.  Emotional swings.  Diarrhea.  Weakness. SEEK IMMEDIATE MEDICAL CARE IF:  You develop chest pain, an irregular heart beat (palpitations), or a rapid heart beat. MAKE SURE YOU:   Understand these instructions.  Will watch your condition.  Will get help right away if you are not doing well or get worse. Document Released: 01/23/2005 Document Revised: 04/17/2011 Document Reviewed: 09/13/2007 Exit[employer] Patient Information 2015 [employer]. This information is not intended to replace advice given to you by your health care provider. Make sure you discuss any questions you have with your health care provider.  

## 2013-09-11 NOTE — Progress Notes (Signed)
Subjective:    Patient ID: Tasha [name], female    DOB: 1960-11-12, 53 y.o.   MRN: [id number]  Thyroid Problem Presents for follow-up visit. Symptoms include anxiety, dry skin and fatigue. Patient reports no cold intolerance, constipation, depressed mood, diaphoresis, diarrhea, hair loss, heat intolerance, hoarse voice, leg swelling, nail problem, palpitations, tremors, visual change, weight gain or weight loss. The symptoms have been worsening. Past treatments include levothyroxine. The treatment provided mild relief. There are no known risk factors.      Review of Systems  Constitutional: Positive for fatigue. Negative for fever, chills, weight loss, weight gain, diaphoresis, activity change, appetite change and unexpected weight change.  HENT: Negative.  Negative for hoarse voice.   Eyes: Negative.   Respiratory: Negative.  Negative for cough, choking, chest tightness, shortness of breath and stridor.   Cardiovascular: Negative.  Negative for chest pain, palpitations and leg swelling.  Gastrointestinal: Negative.  Negative for nausea, vomiting, abdominal pain, diarrhea and constipation.  Endocrine: Negative.  Negative for cold intolerance, heat intolerance, polydipsia, polyphagia and polyuria.  Genitourinary: Negative.   Musculoskeletal: Positive for arthralgias and myalgias. Negative for back pain, gait problem, joint swelling, neck pain and neck stiffness.  Skin: Negative.  Negative for rash.  Allergic/Immunologic: Negative.   Neurological: Negative.  Negative for tremors.  Hematological: Negative.  Negative for adenopathy. Does not bruise/bleed easily.  Psychiatric/Behavioral: Negative for suicidal ideas, hallucinations, behavioral problems, confusion, sleep disturbance, self-injury, dysphoric mood, decreased concentration and agitation. The patient is nervous/anxious. The patient is not hyperactive.        Objective:   Physical Exam  Vitals reviewed. Constitutional: She  is oriented to person, place, and time. She appears well-developed and well-nourished. No distress.  HENT:  Head: Normocephalic and atraumatic.  Mouth/Throat: Oropharynx is clear and moist. No oropharyngeal exudate.  Eyes: Conjunctivae are normal. Right eye exhibits no discharge. Left eye exhibits no discharge. No scleral icterus.  Neck: Normal range of motion. Neck supple. No JVD present. No tracheal deviation present. No thyromegaly present.  Cardiovascular: Normal rate, regular rhythm, normal heart sounds and intact distal pulses.  Exam reveals no gallop and no friction rub.   No murmur heard. Pulmonary/Chest: Effort normal and breath sounds normal. No stridor. No respiratory distress. She has no wheezes. She has no rales. She exhibits no tenderness.  Abdominal: Soft. Bowel sounds are normal. She exhibits no distension and no mass. There is no tenderness. There is no rebound and no guarding.  Musculoskeletal: Normal range of motion. She exhibits no edema and no tenderness.  Lymphadenopathy:    She has no cervical adenopathy.  Neurological: She is oriented to person, place, and time.  Skin: Skin is warm and dry. No rash noted. She is not diaphoretic. No erythema. No pallor.  Psychiatric: She has a normal mood and affect. Her behavior is normal. Judgment and thought content normal.     Lab Results  Component Value Date   WBC 8.9 04/22/2013   HGB 14.6 04/22/2013   HCT 43.6 04/22/2013   PLT 219.0 04/22/2013   GLUCOSE 126* 04/22/2013   CHOL 290* 12/19/2012   TRIG 214.0* 12/19/2012   HDL 31.40* 12/19/2012   LDLDIRECT 217.7 12/19/2012   ALT 29 04/22/2013   AST 30 04/22/2013   NA 140 04/22/2013   K 4.0 04/22/2013   CL 103 04/22/2013   CREATININE 0.9 04/22/2013   BUN 9 04/22/2013   CO2 28 04/22/2013   TSH 19.65* 04/22/2013  Assessment & Plan:

## 2013-09-11 NOTE — Progress Notes (Signed)
Pre visit review using our clinic review tool, if applicable. No additional management support is needed unless otherwise documented below in the visit note. 

## 2013-09-12 ENCOUNTER — Encounter: Payer: Self-pay | Admitting: Internal Medicine

## 2013-09-12 MED ORDER — LEVOTHYROXINE SODIUM 50 MCG PO TABS: 50.0000 ug | ORAL_TABLET | Freq: Every day | ORAL | Status: DC

## 2013-09-12 NOTE — Assessment & Plan Note (Signed)
I have asked her to reconsider taking a statin

## 2013-09-12 NOTE — Assessment & Plan Note (Signed)
This is a new development for her She will work on her lifestyle modifications No meds are needed at this time

## 2013-09-12 NOTE — Assessment & Plan Note (Signed)
She will cont norco as needed for pain 

## 2013-09-12 NOTE — Assessment & Plan Note (Signed)
Her TSH is still elevated so I have asked her to add an additional dose of synthroid

## 2013-09-12 NOTE — Assessment & Plan Note (Signed)
Cont sertraline and prn xanax

## 2013-10-16 ENCOUNTER — Encounter: Payer: Self-pay | Admitting: Internal Medicine

## 2013-10-26 ENCOUNTER — Other Ambulatory Visit: Payer: Self-pay | Admitting: Pulmonary Disease

## 2013-10-28 ENCOUNTER — Other Ambulatory Visit: Payer: Self-pay

## 2013-10-28 MED ORDER — BUPROPION HCL ER (XL) 300 MG PO TB24: 300.0000 mg | ORAL_TABLET | Freq: Every day | ORAL | Status: DC

## 2013-11-06 ENCOUNTER — Telehealth: Payer: Self-pay | Admitting: Internal Medicine

## 2013-11-06 ENCOUNTER — Other Ambulatory Visit: Payer: Self-pay

## 2013-11-06 DIAGNOSIS — E039 Hypothyroidism, unspecified: Secondary | ICD-10-CM

## 2013-11-06 MED ORDER — LEVOTHYROXINE SODIUM 200 MCG PO TABS: 200.0000 ug | ORAL_TABLET | Freq: Every day | ORAL | Status: DC

## 2013-11-06 NOTE — Telephone Encounter (Signed)
Spoke with pt and advised per MD that 50 mcg dose is in addittion to the 200 mcg dose.

## 2013-11-06 NOTE — Telephone Encounter (Signed)
Pt called in and said that meds for thyroid were changed from 200 mg to 8m.  She is feeling really tired and has no energy.  She wanted to speak to nurse about her meds    Thank you!!

## 2013-12-01 ENCOUNTER — Ambulatory Visit (INDEPENDENT_AMBULATORY_CARE_PROVIDER_SITE_OTHER)
Admission: RE | Admit: 2013-12-01 | Discharge: 2013-12-01 | Disposition: A | Discharge status: Home / Self Care | Payer: BC Managed Care – PPO | Source: Ambulatory Visit | Attending: Internal Medicine | Admitting: Internal Medicine

## 2013-12-01 ENCOUNTER — Other Ambulatory Visit (INDEPENDENT_AMBULATORY_CARE_PROVIDER_SITE_OTHER): Payer: BC Managed Care – PPO

## 2013-12-01 ENCOUNTER — Encounter: Payer: Self-pay | Admitting: Internal Medicine

## 2013-12-01 ENCOUNTER — Ambulatory Visit (INDEPENDENT_AMBULATORY_CARE_PROVIDER_SITE_OTHER): Payer: BC Managed Care – PPO | Admitting: Internal Medicine

## 2013-12-01 VITALS — BP 112/82 | HR 58 | Temp 97.8°F | Resp 16 | Ht 67.0 in | Wt 174.0 lb

## 2013-12-01 DIAGNOSIS — J189 Pneumonia, unspecified organism: Secondary | ICD-10-CM | POA: Diagnosis not present

## 2013-12-01 DIAGNOSIS — E038 Other specified hypothyroidism: Secondary | ICD-10-CM

## 2013-12-01 DIAGNOSIS — E039 Hypothyroidism, unspecified: Secondary | ICD-10-CM

## 2013-12-01 DIAGNOSIS — R05 Cough: Secondary | ICD-10-CM | POA: Diagnosis not present

## 2013-12-01 DIAGNOSIS — E785 Hyperlipidemia, unspecified: Secondary | ICD-10-CM | POA: Diagnosis not present

## 2013-12-01 DIAGNOSIS — R9431 Abnormal electrocardiogram [ECG] [EKG]: Secondary | ICD-10-CM

## 2013-12-01 DIAGNOSIS — R059 Cough, unspecified: Secondary | ICD-10-CM

## 2013-12-01 DIAGNOSIS — E118 Type 2 diabetes mellitus with unspecified complications: Secondary | ICD-10-CM | POA: Diagnosis not present

## 2013-12-01 DIAGNOSIS — R072 Precordial pain: Secondary | ICD-10-CM

## 2013-12-01 LAB — LIPID PANEL
Cholesterol: 228 mg/dL — ABNORMAL HIGH (ref 0–200)
HDL: 38.2 mg/dL — ABNORMAL LOW (ref >39.00)
NonHDL: 189.8
Total CHOL/HDL Ratio: 6
Triglycerides: 253 mg/dL — ABNORMAL HIGH (ref 0.0–149.0)
VLDL: 50.6 mg/dL — ABNORMAL HIGH (ref 0.0–40.0)

## 2013-12-01 LAB — BASIC METABOLIC PANEL
BUN: 10 mg/dL (ref 6–23)
CO2: 28 mEq/L (ref 19–32)
Calcium: 9.4 mg/dL (ref 8.4–10.5)
Chloride: 103 mEq/L (ref 96–112)
Creatinine, Ser: 0.9 mg/dL (ref 0.4–1.2)
GFR: 66.6 mL/min (ref >60.00)
Glucose, Bld: 106 mg/dL — ABNORMAL HIGH (ref 70–99)
Potassium: 3.9 mEq/L (ref 3.5–5.1)
Sodium: 140 mEq/L (ref 135–145)

## 2013-12-01 LAB — TROPONIN I: Troponin I: <0.01 ng/mL (ref <0.06)

## 2013-12-01 LAB — D-DIMER, QUANTITATIVE: D-Dimer, Quant: 0.29 ug/mL-FEU (ref 0.00–0.48)

## 2013-12-01 LAB — CARDIAC PANEL
CK-MB: 2.1 ng/mL (ref 0.3–4.0)
Relative Index: 2.4 calc (ref 0.0–2.5)
Total CK: 89 U/L (ref 7–177)

## 2013-12-01 LAB — TSH: TSH: 4.01 u[IU]/mL (ref 0.35–4.50)

## 2013-12-01 LAB — LDL CHOLESTEROL, DIRECT: Direct LDL: 158 mg/dL

## 2013-12-01 MED ORDER — AMOXICILLIN-POT CLAVULANATE 875-125 MG PO TABS: 1.0000 | ORAL_TABLET | Freq: Two times a day (BID) | ORAL | Status: DC

## 2013-12-01 NOTE — Assessment & Plan Note (Signed)
Her prior EKG in 2010 was normal Her EKG today shows NS ST/T wave changes I will check her labs for PE and for ischemia She is high risk for CAD so if her enzymes are negative, then will get a Lexiscan done

## 2013-12-01 NOTE — Assessment & Plan Note (Signed)
Will treat with augmentin

## 2013-12-01 NOTE — Patient Instructions (Signed)

## 2013-12-01 NOTE — Assessment & Plan Note (Signed)
Her blood sugars are well controlled 

## 2013-12-01 NOTE — Assessment & Plan Note (Signed)
Her CXR is normal.

## 2013-12-01 NOTE — Progress Notes (Signed)
Pre visit review using our clinic review tool, if applicable. No additional management support is needed unless otherwise documented below in the visit note. 

## 2013-12-01 NOTE — Assessment & Plan Note (Signed)
Her TSH is in the normal range She will stay on the current dose 

## 2013-12-01 NOTE — Assessment & Plan Note (Signed)
She reiterate today that she will not take a statin Her trigs have improved

## 2013-12-01 NOTE — Progress Notes (Signed)
Subjective:    Patient ID: Tasha [name], female    DOB: 28-Apr-1960, 53 y.o.   MRN: [id number]  HPI Comments: She came in today for a cough but she also complained of an episode of severe left-side chest pressure about 3 weeks ago and she tells me that it was so severe that she almost called 911 but after a few minutes the CP resolved and has not returned.  Cough This is a new problem. The current episode started in the past 7 days. The problem has been gradually worsening. The problem occurs every few hours. The cough is productive of purulent sputum. Associated symptoms include chills, a fever, myalgias, nasal congestion, postnasal drip and a sore throat. Pertinent negatives include no chest pain, ear congestion, ear pain, headaches, heartburn, hemoptysis, rash, rhinorrhea, shortness of breath, sweats, weight loss or wheezing. Risk factors for lung disease include smoking/tobacco exposure. She has tried nothing for the symptoms. The treatment provided no relief. Her past medical history is significant for environmental allergies and pneumonia. There is no history of asthma, bronchiectasis, bronchitis, COPD or emphysema.      Review of Systems  Constitutional: Positive for fever and chills. Negative for weight loss, diaphoresis, activity change, appetite change, fatigue and unexpected weight change.  HENT: Positive for congestion, postnasal drip, sinus pressure and sore throat. Negative for ear pain, facial swelling, rhinorrhea, sneezing and trouble swallowing.   Eyes: Negative.   Respiratory: Positive for cough. Negative for apnea, hemoptysis, choking, chest tightness, shortness of breath, wheezing and stridor.   Cardiovascular: Negative.  Negative for chest pain, palpitations and leg swelling.  Gastrointestinal: Negative.  Negative for heartburn, nausea, vomiting, abdominal pain, diarrhea, constipation and blood in stool.  Endocrine: Negative.   Genitourinary: Negative.     Musculoskeletal: Positive for arthralgias and myalgias. Negative for back pain, gait problem, joint swelling, neck pain and neck stiffness.  Skin: Negative.  Negative for rash.  Allergic/Immunologic: Positive for environmental allergies.  Neurological: Negative.  Negative for headaches.  Hematological: Negative.  Negative for adenopathy. Does not bruise/bleed easily.  Psychiatric/Behavioral: Negative.        Objective:   Physical Exam  Vitals reviewed. Constitutional: She is oriented to person, place, and time. She appears well-developed and well-nourished. No distress.  HENT:  Head: Normocephalic and atraumatic.  Mouth/Throat: Oropharynx is clear and moist. No oropharyngeal exudate.  Eyes: Conjunctivae are normal. Right eye exhibits no discharge. Left eye exhibits no discharge. No scleral icterus.  Neck: Normal range of motion. Neck supple. No JVD present. No tracheal deviation present. No thyromegaly present.  Cardiovascular: Normal rate, regular rhythm, normal heart sounds and intact distal pulses.  Exam reveals no gallop and no friction rub.   No murmur heard. Pulmonary/Chest: Effort normal and breath sounds normal. No stridor. No respiratory distress. She has no wheezes. She has no rales. She exhibits no tenderness.  Abdominal: Soft. Bowel sounds are normal. She exhibits no distension and no mass. There is no tenderness. There is no rebound and no guarding.  Musculoskeletal: Normal range of motion. She exhibits no edema and no tenderness.  Lymphadenopathy:    She has no cervical adenopathy.  Neurological: She is oriented to person, place, and time.  Skin: Skin is warm and dry. No rash noted. She is not diaphoretic. No erythema. No pallor.  Psychiatric: She has a normal mood and affect. Her behavior is normal. Judgment and thought content normal.    Lab Results  Component Value Date   WBC 8.9  04/22/2013   HGB 14.6 04/22/2013   HCT 43.6 04/22/2013   PLT 219.0 04/22/2013   GLUCOSE  159* 09/11/2013   CHOL 280* 09/11/2013   TRIG 340.0* 09/11/2013   HDL 37.50* 09/11/2013   LDLDIRECT 217.1 09/11/2013   ALT 29 04/22/2013   AST 30 04/22/2013   NA 138 09/11/2013   K 3.6 09/11/2013   CL 103 09/11/2013   CREATININE 0.8 09/11/2013   BUN 12 09/11/2013   CO2 26 09/11/2013   TSH 13.38* 09/11/2013   HGBA1C 6.6* 09/11/2013        Assessment & Plan:

## 2013-12-01 NOTE — Assessment & Plan Note (Signed)
Will get a Lexiscan done to check for ischemia

## 2013-12-02 ENCOUNTER — Telehealth: Payer: Self-pay | Admitting: Internal Medicine

## 2013-12-02 ENCOUNTER — Encounter: Payer: Self-pay | Admitting: Internal Medicine

## 2013-12-02 NOTE — Telephone Encounter (Signed)
emmi emailed °

## 2013-12-12 ENCOUNTER — Encounter: Payer: Self-pay | Admitting: Cardiology

## 2013-12-16 ENCOUNTER — Ambulatory Visit (HOSPITAL_COMMUNITY): Payer: BC Managed Care – PPO | Attending: Cardiology | Admitting: Radiology

## 2013-12-16 DIAGNOSIS — R002 Palpitations: Secondary | ICD-10-CM | POA: Diagnosis not present

## 2013-12-16 DIAGNOSIS — E119 Type 2 diabetes mellitus without complications: Secondary | ICD-10-CM | POA: Insufficient documentation

## 2013-12-16 DIAGNOSIS — R079 Chest pain, unspecified: Secondary | ICD-10-CM | POA: Insufficient documentation

## 2013-12-16 DIAGNOSIS — R9431 Abnormal electrocardiogram [ECG] [EKG]: Secondary | ICD-10-CM | POA: Diagnosis not present

## 2013-12-16 DIAGNOSIS — R072 Precordial pain: Secondary | ICD-10-CM | POA: Diagnosis not present

## 2013-12-16 MED ORDER — TECHNETIUM TC 99M SESTAMIBI GENERIC - CARDIOLITE
30.0000 | Freq: Once | INTRAVENOUS | Status: AC | PRN
  Administered 2013-12-16: 30 via INTRAVENOUS

## 2013-12-16 MED ORDER — REGADENOSON 0.4 MG/5ML IV SOLN
0.4000 mg | Freq: Once | INTRAVENOUS | Status: AC
  Administered 2013-12-16: 0.4 mg via INTRAVENOUS

## 2013-12-16 MED ORDER — TECHNETIUM TC 99M SESTAMIBI GENERIC - CARDIOLITE
10.0000 | Freq: Once | INTRAVENOUS | Status: AC | PRN
  Administered 2013-12-16: 10 via INTRAVENOUS

## 2013-12-16 NOTE — Progress Notes (Signed)
[facility] [address] [place], [place] [address] [phone]    Cardiology Nuclear Med Study  Tasha [name] is a 53 y.o. female     MRN : [id number]     DOB: 1960-11-21  Procedure Date: 12/16/2013  Nuclear Med Background Indication for Stress Test:  Evaluation for Ischemia, Abnormal EKG and 12-01-2013 Troponin negative History:  MPI 2020 (normal) EF 70%, hx seizures x3 (last one 2003) Cardiac Risk Factors: NIDDM  Symptoms:  Chest Pain and Palpitations   Nuclear Pre-Procedure Caffeine/Decaff Intake:  None> 12 hrs NPO After: 8:00pm   Lungs:  clear O2 Sat: 96% on room air. IV 0.9% NS with Angio Cath:  22g  IV Site: R Antecubital x 1, tolerated well IV Started by:  Irven Baltimore, RN  Chest Size (in):  38 Cup Size: C  Height: 5\' 7"  (1.702 m)  Weight:  167 lb (75.751 kg)  BMI:  Body mass index is 26.15 kg/(m^2). Tech Comments:  N/A    Nuclear Med Study 1 or 2 day study: 1 day  Stress Test Type:  Lexiscan  Reading MD: N/A  Order Authorizing Provider:  [name], MD  Resting Radionuclide: Technetium 29m Sestamibi  Resting Radionuclide Dose: 11.0 mCi   Stress Radionuclide:  Technetium 18m Sestamibi  Stress Radionuclide Dose: 33.0 mCi           Stress Protocol Rest HR: 60 Stress HR: 85  Rest BP: 133/62 Stress BP: 131/69  Exercise Time (min): n/a METS: n/a           Dose of Adenosine (mg):  n/a Dose of Lexiscan: 0.4 mg  Dose of Atropine (mg): n/a Dose of Dobutamine: n/a mcg/kg/min (at max HR)  Stress Test Technologist: Glade Lloyd, BS-ES  Nuclear Technologist:  Earl Many, CNMT     Rest Procedure:  Myocardial perfusion imaging was performed at rest 45 minutes following the intravenous administration of Technetium 57m Sestamibi. Rest ECG: normal sinus rhythm. No significant abnormalities.  Stress Procedure:  The patient received IV Lexiscan 0.4 mg over 15-seconds.  Technetium 58m Sestamibi injected at 30-seconds.   Quantitative spect images were obtained after a 45 minute delay.  During the infusion of Lexiscan the patient complained of chest discomfort and stomach upset.  These symptoms began to subside in recovery.  Stress ECG: No significant change from baseline ECG  QPS Raw Data Images:  Normal; no motion artifact; normal heart/lung ratio. Stress Images:  Normal homogeneous uptake in all areas of the myocardium. Rest Images:  Normal homogeneous uptake in all areas of the myocardium. Subtraction (SDS):  No evidence of ischemia. Transient Ischemic Dilatation (Normal <1.22):  1.16 Lung/Heart Ratio (Normal <0.45):  0.36  Quantitative Gated Spect Images QGS EDV:  82 ml QGS ESV:  30 ml  Impression Exercise Capacity:  Lexiscan with no exercise. BP Response:  Normal blood pressure response. Clinical Symptoms:  chest discomfort ECG Impression:  No significant ST segment change suggestive of ischemia. Comparison with Prior Nuclear Study:  This study is compared with the report of the study from September, 2010  Overall Impression:  Normal stress nuclear study. There is no scar or ischemia. There is no change since the report of the study from September, 2010.  LV Ejection Fraction: 63%.  LV Wall Motion:  Normal Wall Motion.  Dola Argyle, MD

## 2013-12-17 ENCOUNTER — Encounter: Payer: Self-pay | Admitting: Internal Medicine

## 2013-12-25 ENCOUNTER — Telehealth: Payer: Self-pay | Admitting: Internal Medicine

## 2013-12-25 NOTE — Telephone Encounter (Signed)
Patient Information:  Caller Name: [name]  Phone: [phone]  Patient: Tasha [name]  Gender: Female  DOB: 1960-05-09  Age: 53 Years  PCP: [name] (Adults only)  Pregnant: No  Office Follow Up:  Does the office need to follow up with this patient?: No  Instructions For The Office: N/A   Symptoms  Reason For Call & Symptoms: Pt reports she has weakness of joints.  Reviewed Health History In EMR: Yes  Reviewed Medications In EMR: Yes  Reviewed Allergies In EMR: Yes  Reviewed Surgeries / Procedures: Yes  Date of Onset of Symptoms: 12/22/2013 OB / GYN:  LMP: Unknown  Guideline(s) Used:  Weakness (Generalized) and Fatigue  Disposition Per Guideline:   See Today in Office  Reason For Disposition Reached:   Patient wants to be seen  Advice Given:  Call Back If:  You become worse.  Patient Will Follow Care Advice:  YES  Appointment Scheduled:  12/26/2013 09:15:00 Appointment Scheduled Provider:  Cathlean Cower (Adults only)

## 2013-12-26 ENCOUNTER — Ambulatory Visit: Payer: Self-pay | Admitting: Internal Medicine

## 2013-12-30 ENCOUNTER — Telehealth: Payer: Self-pay | Admitting: Internal Medicine

## 2013-12-30 DIAGNOSIS — IMO0001 Reserved for inherently not codable concepts without codable children: Secondary | ICD-10-CM

## 2013-12-30 MED ORDER — MELOXICAM 15 MG PO TABS: 15.0000 mg | ORAL_TABLET | Freq: Every day | ORAL | Status: DC

## 2013-12-30 MED ORDER — HYDROCODONE-ACETAMINOPHEN 5-325 MG PO TABS: 1.0000 | ORAL_TABLET | Freq: Four times a day (QID) | ORAL | Status: DC | PRN

## 2013-12-30 NOTE — Telephone Encounter (Signed)
Patient notified

## 2013-12-30 NOTE — Telephone Encounter (Signed)
done

## 2013-12-30 NOTE — Telephone Encounter (Signed)
Pt requesting refill of Vicodin, and Movic for muscle spasms, pain. [phone]

## 2014-01-22 ENCOUNTER — Telehealth: Payer: Self-pay | Admitting: Internal Medicine

## 2014-01-22 NOTE — Telephone Encounter (Signed)
Pt request phone call from the assistant concern about the urine sample that she has to give when she come pick up her medication. Please call pt,she is not sure what she has to do.

## 2014-02-03 ENCOUNTER — Other Ambulatory Visit (INDEPENDENT_AMBULATORY_CARE_PROVIDER_SITE_OTHER): Payer: BC Managed Care – PPO

## 2014-02-03 ENCOUNTER — Encounter: Payer: Self-pay | Admitting: Internal Medicine

## 2014-02-03 ENCOUNTER — Ambulatory Visit (INDEPENDENT_AMBULATORY_CARE_PROVIDER_SITE_OTHER): Payer: BC Managed Care – PPO | Admitting: Internal Medicine

## 2014-02-03 VITALS — BP 118/70 | HR 64 | Temp 98.2°F | Resp 12 | Ht 67.0 in | Wt 170.0 lb

## 2014-02-03 DIAGNOSIS — M609 Myositis, unspecified: Secondary | ICD-10-CM | POA: Diagnosis not present

## 2014-02-03 DIAGNOSIS — G43001 Migraine without aura, not intractable, with status migrainosus: Secondary | ICD-10-CM | POA: Diagnosis not present

## 2014-02-03 DIAGNOSIS — E038 Other specified hypothyroidism: Secondary | ICD-10-CM

## 2014-02-03 DIAGNOSIS — IMO0001 Reserved for inherently not codable concepts without codable children: Secondary | ICD-10-CM

## 2014-02-03 DIAGNOSIS — E118 Type 2 diabetes mellitus with unspecified complications: Secondary | ICD-10-CM | POA: Diagnosis not present

## 2014-02-03 DIAGNOSIS — M791 Myalgia: Secondary | ICD-10-CM | POA: Diagnosis not present

## 2014-02-03 LAB — BASIC METABOLIC PANEL
BUN: 8 mg/dL (ref 6–23)
CO2: 27 mEq/L (ref 19–32)
Calcium: 9 mg/dL (ref 8.4–10.5)
Chloride: 106 mEq/L (ref 96–112)
Creatinine, Ser: 0.8 mg/dL (ref 0.4–1.2)
GFR: 84.02 mL/min (ref >60.00)
Glucose, Bld: 125 mg/dL — ABNORMAL HIGH (ref 70–99)
Potassium: 3.8 mEq/L (ref 3.5–5.1)
Sodium: 141 mEq/L (ref 135–145)

## 2014-02-03 LAB — TSH: TSH: 0.04 u[IU]/mL — ABNORMAL LOW (ref 0.35–4.50)

## 2014-02-03 LAB — HEMOGLOBIN A1C: Hgb A1c MFr Bld: 6.8 % — ABNORMAL HIGH (ref 4.6–6.5)

## 2014-02-03 MED ORDER — HYDROCODONE-ACETAMINOPHEN 5-325 MG PO TABS: 1.0000 | ORAL_TABLET | Freq: Four times a day (QID) | ORAL | Status: DC | PRN

## 2014-02-03 MED ORDER — SUMATRIPTAN SUCCINATE 50 MG PO TABS: 50.0000 mg | ORAL_TABLET | ORAL | Status: DC | PRN

## 2014-02-03 NOTE — Patient Instructions (Signed)

## 2014-02-03 NOTE — Assessment & Plan Note (Signed)
Her A1C has not been high enough to treat  Will recheck today and will treat if needed

## 2014-02-03 NOTE — Assessment & Plan Note (Signed)
I will recheck her TSH level today and will adjust her dose if needed 

## 2014-02-03 NOTE — Assessment & Plan Note (Signed)
She will cont norco as needed for pain 

## 2014-02-03 NOTE — Progress Notes (Signed)
Subjective:    Patient ID: Tasha [name], female    DOB: March 23, 1960, 53 y.o.   MRN: [id number]  Headache  This is a recurrent problem. The current episode started 1 to 4 weeks ago. The problem occurs intermittently. The problem has been unchanged. The pain is located in the bilateral and temporal region. The pain does not radiate. The pain quality is similar to prior headaches. The quality of the pain is described as band-like and aching. The pain is at a severity of 2/10. The pain is mild. Pertinent negatives include no abdominal pain, abnormal behavior, anorexia, back pain, blurred vision, coughing, dizziness, drainage, ear pain, eye pain, eye redness, eye watering, facial sweating, fever, hearing loss, insomnia, loss of balance, muscle aches, nausea, neck pain, numbness, phonophobia, photophobia, rhinorrhea, scalp tenderness, seizures, sinus pressure, sore throat, swollen glands, tingling, tinnitus, visual change, vomiting, weakness or weight loss. The symptoms are aggravated by emotional stress. She has tried oral narcotics, acetaminophen and NSAIDs for the symptoms. The treatment provided mild relief. Her past medical history is significant for migraine headaches and migraines in the family.      Review of Systems  Constitutional: Negative.  Negative for fever, chills, weight loss, diaphoresis, activity change, appetite change, fatigue and unexpected weight change.  HENT: Negative for ear pain, hearing loss, rhinorrhea, sinus pressure, sore throat and tinnitus.   Eyes: Negative.  Negative for blurred vision, photophobia, pain and redness.  Respiratory: Negative.  Negative for cough, choking, chest tightness, shortness of breath and stridor.   Cardiovascular: Negative.  Negative for chest pain, palpitations and leg swelling.  Gastrointestinal: Negative.  Negative for nausea, vomiting, abdominal pain, diarrhea, constipation, blood in stool and anorexia.  Endocrine: Negative.  Negative for  polydipsia, polyphagia and polyuria.  Genitourinary: Negative.   Musculoskeletal: Positive for myalgias and arthralgias. Negative for back pain, joint swelling, gait problem, neck pain and neck stiffness.  Skin: Negative.  Negative for rash.  Allergic/Immunologic: Negative.   Neurological: Positive for headaches. Negative for dizziness, tingling, seizures, weakness, numbness and loss of balance.  Hematological: Negative.  Negative for adenopathy. Does not bruise/bleed easily.  Psychiatric/Behavioral: Positive for sleep disturbance. Negative for suicidal ideas, behavioral problems, confusion, self-injury, dysphoric mood and decreased concentration. The patient is nervous/anxious. The patient does not have insomnia and is not hyperactive.        Objective:   Physical Exam  Constitutional: She is oriented to person, place, and time. She appears well-developed and well-nourished.  Non-toxic appearance. She does not have a sickly appearance. She does not appear ill. No distress.  HENT:  Head: Normocephalic and atraumatic.  Mouth/Throat: No oropharyngeal exudate.  Eyes: Conjunctivae and EOM are normal. Pupils are equal, round, and reactive to light. Right eye exhibits no discharge. Left eye exhibits no discharge. No scleral icterus. Right eye exhibits normal extraocular motion and no nystagmus. Left eye exhibits normal extraocular motion and no nystagmus. Right pupil is round and reactive. Left pupil is round and reactive. Pupils are equal.  Neck: Normal range of motion. Neck supple. No JVD present. No tracheal deviation present. No thyromegaly present.  Cardiovascular: Normal rate, regular rhythm, normal heart sounds and intact distal pulses.  Exam reveals no gallop and no friction rub.   No murmur heard. Pulmonary/Chest: Effort normal and breath sounds normal. No stridor. No respiratory distress. She has no wheezes. She has no rales. She exhibits no tenderness.  Abdominal: Soft. Bowel sounds are  normal. She exhibits no distension and no mass. There  is no tenderness. There is no rebound and no guarding.  Musculoskeletal: Normal range of motion. She exhibits no edema or tenderness.  Lymphadenopathy:    She has no cervical adenopathy.  Neurological: She is alert and oriented to person, place, and time. She has normal strength. She displays no atrophy, no tremor and normal reflexes. No cranial nerve deficit or sensory deficit. She exhibits normal muscle tone. She displays a negative Romberg sign. She displays no seizure activity. Coordination and gait normal. She displays no Babinski's sign on the right side. She displays no Babinski's sign on the left side.  Reflex Scores:      Tricep reflexes are 1+ on the right side and 1+ on the left side.      Bicep reflexes are 1+ on the right side and 1+ on the left side.      Brachioradialis reflexes are 1+ on the right side and 1+ on the left side.      Patellar reflexes are 1+ on the right side and 1+ on the left side.      Achilles reflexes are 1+ on the right side and 1+ on the left side. Skin: Skin is warm and dry. No rash noted. She is not diaphoretic. No erythema. No pallor.  Psychiatric: She has a normal mood and affect. Her behavior is normal. Judgment and thought content normal.  Vitals reviewed.     Lab Results  Component Value Date   WBC 8.9 04/22/2013   HGB 14.6 04/22/2013   HCT 43.6 04/22/2013   PLT 219.0 04/22/2013   GLUCOSE 106* 12/01/2013   CHOL 228* 12/01/2013   TRIG 253.0* 12/01/2013   HDL 38.20* 12/01/2013   LDLDIRECT 158.0 12/01/2013   ALT 29 04/22/2013   AST 30 04/22/2013   NA 140 12/01/2013   K 3.9 12/01/2013   CL 103 12/01/2013   CREATININE 0.9 12/01/2013   BUN 10 12/01/2013   CO2 28 12/01/2013   TSH 4.01 12/01/2013   HGBA1C 6.6* 09/11/2013      Assessment & Plan:

## 2014-02-03 NOTE — Assessment & Plan Note (Signed)
She describes this as a usual and recurrent headache This is c/w migraine Will try imitrex

## 2014-02-04 ENCOUNTER — Telehealth: Payer: Self-pay | Admitting: Internal Medicine

## 2014-02-04 NOTE — Telephone Encounter (Signed)
[facility] called.  States Lakishia took scripts in to be filled.  In the scripts was also a script for Hulan Amato for [employer].  Advised to shred.  Please enter a safety zone and reprint Olena Heckle script for him.  Thanks!

## 2014-02-05 ENCOUNTER — Telehealth: Payer: Self-pay | Admitting: Internal Medicine

## 2014-02-05 NOTE — Telephone Encounter (Signed)
Per chart md rx Imitrix. MD is out of office pls advise on msg below...Johny Chess

## 2014-02-05 NOTE — Telephone Encounter (Signed)
She has hydrocodone on her list for pain that I would recommend taking in the meantime. If she is having consistent headaches would recommend that she speak with her doctor about other options. Most other headache options would be daily controller medications to help prevent headaches. Please remind her that with imitrex she is not to take more than 2 pills in 24 hours. Dr. Ronnald Ramp will be back Monday.

## 2014-02-05 NOTE — Telephone Encounter (Signed)
Notified pt with md response. She stated the imitrex made her felt like she was choking, her head felt so heavy. Advise pt to take hydrocodone as md recommended and if she still having headaches over the weekend to call back on Monday to see what Dr. Ronnald Ramp advise. Pt agreed...Tasha [name]

## 2014-02-05 NOTE — Telephone Encounter (Signed)
Patient states she was put on a migraine med by Dr. Ronnald Ramp. She states this med is making her feel bad.  She is requesting something else to be called in to her pharmacy at [facility] on [address].  I did tell patient that Dr. Ronnald Ramp is out of the office.  She is requesting something to be called in as soon as possible.  Is requesting a call back in regards to what other options she may have if something can't get sent in today.

## 2014-02-12 ENCOUNTER — Encounter: Payer: Self-pay | Admitting: Pulmonary Disease

## 2014-02-25 ENCOUNTER — Other Ambulatory Visit: Payer: Self-pay | Admitting: Internal Medicine

## 2014-04-09 ENCOUNTER — Other Ambulatory Visit: Payer: Self-pay | Admitting: Internal Medicine

## 2014-05-01 ENCOUNTER — Other Ambulatory Visit: Payer: Self-pay | Admitting: Internal Medicine

## 2014-05-22 ENCOUNTER — Other Ambulatory Visit: Payer: Self-pay | Admitting: Internal Medicine

## 2014-05-27 ENCOUNTER — Emergency Department (HOSPITAL_COMMUNITY)
Admission: EM | Admit: 2014-05-27 | Discharge: 2014-05-27 | Disposition: A | Discharge status: Home / Self Care | Payer: BLUE CROSS/BLUE SHIELD | Attending: Emergency Medicine | Admitting: Emergency Medicine

## 2014-05-27 ENCOUNTER — Emergency Department (HOSPITAL_COMMUNITY): Payer: BLUE CROSS/BLUE SHIELD

## 2014-05-27 ENCOUNTER — Encounter (HOSPITAL_COMMUNITY): Payer: Self-pay | Admitting: *Deleted

## 2014-05-27 DIAGNOSIS — F329 Major depressive disorder, single episode, unspecified: Secondary | ICD-10-CM | POA: Insufficient documentation

## 2014-05-27 DIAGNOSIS — Z720 Tobacco use: Secondary | ICD-10-CM | POA: Diagnosis not present

## 2014-05-27 DIAGNOSIS — Z8739 Personal history of other diseases of the musculoskeletal system and connective tissue: Secondary | ICD-10-CM | POA: Insufficient documentation

## 2014-05-27 DIAGNOSIS — Y9389 Activity, other specified: Secondary | ICD-10-CM | POA: Insufficient documentation

## 2014-05-27 DIAGNOSIS — E780 Pure hypercholesterolemia: Secondary | ICD-10-CM | POA: Insufficient documentation

## 2014-05-27 DIAGNOSIS — E039 Hypothyroidism, unspecified: Secondary | ICD-10-CM | POA: Diagnosis not present

## 2014-05-27 DIAGNOSIS — E663 Overweight: Secondary | ICD-10-CM | POA: Diagnosis not present

## 2014-05-27 DIAGNOSIS — R109 Unspecified abdominal pain: Secondary | ICD-10-CM

## 2014-05-27 DIAGNOSIS — Y92410 Unspecified street and highway as the place of occurrence of the external cause: Secondary | ICD-10-CM | POA: Insufficient documentation

## 2014-05-27 DIAGNOSIS — M542 Cervicalgia: Secondary | ICD-10-CM

## 2014-05-27 DIAGNOSIS — S199XXA Unspecified injury of neck, initial encounter: Secondary | ICD-10-CM | POA: Insufficient documentation

## 2014-05-27 DIAGNOSIS — S299XXA Unspecified injury of thorax, initial encounter: Secondary | ICD-10-CM | POA: Diagnosis not present

## 2014-05-27 DIAGNOSIS — K219 Gastro-esophageal reflux disease without esophagitis: Secondary | ICD-10-CM | POA: Insufficient documentation

## 2014-05-27 DIAGNOSIS — F411 Generalized anxiety disorder: Secondary | ICD-10-CM | POA: Insufficient documentation

## 2014-05-27 DIAGNOSIS — M546 Pain in thoracic spine: Secondary | ICD-10-CM | POA: Diagnosis not present

## 2014-05-27 DIAGNOSIS — Y998 Other external cause status: Secondary | ICD-10-CM | POA: Insufficient documentation

## 2014-05-27 DIAGNOSIS — S3991XA Unspecified injury of abdomen, initial encounter: Secondary | ICD-10-CM | POA: Insufficient documentation

## 2014-05-27 DIAGNOSIS — M549 Dorsalgia, unspecified: Secondary | ICD-10-CM

## 2014-05-27 DIAGNOSIS — Z79899 Other long term (current) drug therapy: Secondary | ICD-10-CM | POA: Insufficient documentation

## 2014-05-27 LAB — COMPREHENSIVE METABOLIC PANEL
ALT: 34 U/L (ref 0–35)
AST: 34 U/L (ref 0–37)
Albumin: 3.7 g/dL (ref 3.5–5.2)
Alkaline Phosphatase: 83 U/L (ref 39–117)
Anion gap: 8 (ref 5–15)
BUN: 9 mg/dL (ref 6–23)
CO2: 26 mmol/L (ref 19–32)
Calcium: 9.4 mg/dL (ref 8.4–10.5)
Chloride: 106 mmol/L (ref 96–112)
Creatinine, Ser: 0.81 mg/dL (ref 0.50–1.10)
GFR calc Af Amer: >90 mL/min (ref >90)
GFR calc non Af Amer: 80 mL/min — ABNORMAL LOW (ref >90)
Glucose, Bld: 122 mg/dL — ABNORMAL HIGH (ref 70–99)
Potassium: 4.1 mmol/L (ref 3.5–5.1)
Sodium: 140 mmol/L (ref 135–145)
Total Bilirubin: 0.6 mg/dL (ref 0.3–1.2)
Total Protein: 6.6 g/dL (ref 6.0–8.3)

## 2014-05-27 MED ORDER — IOHEXOL 300 MG/ML  SOLN
100.0000 mL | Freq: Once | INTRAMUSCULAR | Status: AC | PRN
  Administered 2014-05-27: 80 mL via INTRAVENOUS

## 2014-05-27 MED ORDER — CYCLOBENZAPRINE HCL 10 MG PO TABS: 10.0000 mg | ORAL_TABLET | Freq: Two times a day (BID) | ORAL | Status: DC | PRN

## 2014-05-27 MED ORDER — IBUPROFEN 600 MG PO TABS: 600.0000 mg | ORAL_TABLET | Freq: Four times a day (QID) | ORAL | Status: DC | PRN

## 2014-05-27 MED ORDER — HYDROCODONE-ACETAMINOPHEN 5-325 MG PO TABS
1.0000 | ORAL_TABLET | Freq: Once | ORAL | Status: AC
  Administered 2014-05-27: 1 via ORAL
  Filled 2014-05-27: qty 1

## 2014-05-27 NOTE — ED Notes (Signed)
Pt stable, ambulatory, husband at bedside, states understanding of discharge instructions
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
## 2014-05-27 NOTE — ED Notes (Signed)
Pt in CT.
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
## 2014-06-15 ENCOUNTER — Other Ambulatory Visit: Payer: Self-pay | Admitting: Internal Medicine

## 2014-06-22 ENCOUNTER — Ambulatory Visit (INDEPENDENT_AMBULATORY_CARE_PROVIDER_SITE_OTHER): Payer: BLUE CROSS/BLUE SHIELD | Admitting: Internal Medicine

## 2014-06-22 ENCOUNTER — Encounter: Payer: Self-pay | Admitting: Internal Medicine

## 2014-06-22 ENCOUNTER — Other Ambulatory Visit (INDEPENDENT_AMBULATORY_CARE_PROVIDER_SITE_OTHER): Payer: BLUE CROSS/BLUE SHIELD

## 2014-06-22 VITALS — BP 116/68 | HR 58 | Temp 98.4°F | Resp 16 | Ht 67.0 in | Wt 168.0 lb

## 2014-06-22 DIAGNOSIS — M609 Myositis, unspecified: Secondary | ICD-10-CM

## 2014-06-22 DIAGNOSIS — R202 Paresthesia of skin: Secondary | ICD-10-CM

## 2014-06-22 DIAGNOSIS — R200 Anesthesia of skin: Secondary | ICD-10-CM | POA: Diagnosis not present

## 2014-06-22 DIAGNOSIS — F418 Other specified anxiety disorders: Secondary | ICD-10-CM

## 2014-06-22 DIAGNOSIS — E038 Other specified hypothyroidism: Secondary | ICD-10-CM

## 2014-06-22 DIAGNOSIS — E785 Hyperlipidemia, unspecified: Secondary | ICD-10-CM

## 2014-06-22 DIAGNOSIS — M791 Myalgia: Secondary | ICD-10-CM

## 2014-06-22 DIAGNOSIS — E118 Type 2 diabetes mellitus with unspecified complications: Secondary | ICD-10-CM

## 2014-06-22 DIAGNOSIS — M170 Bilateral primary osteoarthritis of knee: Secondary | ICD-10-CM | POA: Insufficient documentation

## 2014-06-22 DIAGNOSIS — IMO0001 Reserved for inherently not codable concepts without codable children: Secondary | ICD-10-CM

## 2014-06-22 LAB — BASIC METABOLIC PANEL
BUN: 12 mg/dL (ref 6–23)
CO2: 28 mEq/L (ref 19–32)
Calcium: 9.7 mg/dL (ref 8.4–10.5)
Chloride: 105 mEq/L (ref 96–112)
Creatinine, Ser: 0.77 mg/dL (ref 0.40–1.20)
GFR: 82.65 mL/min (ref >60.00)
Glucose, Bld: 114 mg/dL — ABNORMAL HIGH (ref 70–99)
Potassium: 4.4 mEq/L (ref 3.5–5.1)
Sodium: 138 mEq/L (ref 135–145)

## 2014-06-22 LAB — HEMOGLOBIN A1C: Hgb A1c MFr Bld: 6.8 % — ABNORMAL HIGH (ref 4.6–6.5)

## 2014-06-22 LAB — LIPID PANEL
Cholesterol: 196 mg/dL (ref 0–200)
HDL: 40.5 mg/dL (ref >39.00)
LDL Cholesterol: 127 mg/dL — ABNORMAL HIGH (ref 0–99)
NonHDL: 155.5
Total CHOL/HDL Ratio: 5
Triglycerides: 144 mg/dL (ref 0.0–149.0)
VLDL: 28.8 mg/dL (ref 0.0–40.0)

## 2014-06-22 LAB — TSH: TSH: 0.03 u[IU]/mL — ABNORMAL LOW (ref 0.35–4.50)

## 2014-06-22 MED ORDER — HYDROCODONE-ACETAMINOPHEN 5-325 MG PO TABS: 1.0000 | ORAL_TABLET | Freq: Four times a day (QID) | ORAL | Status: DC | PRN

## 2014-06-22 MED ORDER — ALPRAZOLAM 0.5 MG PO TABS: ORAL_TABLET | ORAL | Status: DC

## 2014-06-22 NOTE — Assessment & Plan Note (Signed)
Her exam is WNL Will get a NCS/EMG done to see if there is a neuropathy to explain her symptoms

## 2014-06-22 NOTE — Assessment & Plan Note (Signed)
Will cont the current meds for pain relief

## 2014-06-22 NOTE — Patient Instructions (Signed)
Hypothyroidism The thyroid is a large gland located in the lower front of your neck. The thyroid gland helps control metabolism. Metabolism is how your body handles food. It controls metabolism with the hormone thyroxine. When this gland is underactive (hypothyroid), it produces too little hormone.  CAUSES These include:   Absence or destruction of thyroid tissue.  Goiter due to iodine deficiency.  Goiter due to medications.  Congenital defects (since birth).  Problems with the pituitary. This causes a lack of TSH (thyroid stimulating hormone). This hormone tells the thyroid to turn out more hormone. SYMPTOMS  Lethargy (feeling as though you have no energy)  Cold intolerance  Weight gain (in spite of normal food intake)  Dry skin  Coarse hair  Menstrual irregularity (if severe, may lead to infertility)  Slowing of thought processes Cardiac problems are also caused by insufficient amounts of thyroid hormone. Hypothyroidism in the newborn is cretinism, and is an extreme form. It is important that this form be treated adequately and immediately or it will lead rapidly to retarded physical and mental development. DIAGNOSIS  To prove hypothyroidism, your caregiver may do blood tests and ultrasound tests. Sometimes the signs are hidden. It may be necessary for your caregiver to watch this illness with blood tests either before or after diagnosis and treatment. TREATMENT  Low levels of thyroid hormone are increased by using synthetic thyroid hormone. This is a safe, effective treatment. It usually takes about four weeks to gain the full effects of the medication. After you have the full effect of the medication, it will generally take another four weeks for problems to leave. Your caregiver may start you on low doses. If you have had heart problems the dose may be gradually increased. It is generally not an emergency to get rapidly to normal. HOME CARE INSTRUCTIONS   Take your  medications as your caregiver suggests. Let your caregiver know of any medications you are taking or start taking. Your caregiver will help you with dosage schedules.  As your condition improves, your dosage needs may increase. It will be necessary to have continuing blood tests as suggested by your caregiver.  Report all suspected medication side effects to your caregiver. SEEK MEDICAL CARE IF: Seek medical care if you develop:  Sweating.  Tremulousness (tremors).  Anxiety.  Rapid weight loss.  Heat intolerance.  Emotional swings.  Diarrhea.  Weakness. SEEK IMMEDIATE MEDICAL CARE IF:  You develop chest pain, an irregular heart beat (palpitations), or a rapid heart beat. MAKE SURE YOU:   Understand these instructions.  Will watch your condition.  Will get help right away if you are not doing well or get worse. Document Released: 01/23/2005 Document Revised: 04/17/2011 Document Reviewed: 09/13/2007 Exit[employer] Patient Information 2015 [employer]. This information is not intended to replace advice given to you by your health care provider. Make sure you discuss any questions you have with your health care provider.  

## 2014-06-22 NOTE — Assessment & Plan Note (Signed)
Cont sertraline and xanax as needed
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
## 2014-06-22 NOTE — Addendum Note (Signed)
Addended by: Janith Lima on: 06/22/2014 03:08 PM   Modules accepted: Orders, Medications

## 2014-06-22 NOTE — Assessment & Plan Note (Signed)
Will cont norco as needed for pain 

## 2014-06-22 NOTE — Progress Notes (Signed)
Pre visit review using our clinic review tool, if applicable. No additional management support is needed unless otherwise documented below in the visit note. 

## 2014-06-22 NOTE — Assessment & Plan Note (Signed)
She was referred for an eye exam Will monitor her A1C and will treat if indicated

## 2014-06-22 NOTE — Assessment & Plan Note (Addendum)
I will recheck her TSH and will adjust her dose if indicated   ..her TSh is suppressed, will ask her to stop taking the 50 mcg dose, lowering her dose from 250 mcg to 200 mcg

## 2014-06-25 ENCOUNTER — Other Ambulatory Visit: Payer: Self-pay | Admitting: *Deleted

## 2014-06-25 DIAGNOSIS — R200 Anesthesia of skin: Secondary | ICD-10-CM

## 2014-06-25 DIAGNOSIS — R202 Paresthesia of skin: Secondary | ICD-10-CM

## 2014-06-30 ENCOUNTER — Telehealth: Payer: Self-pay | Admitting: Neurology

## 2014-06-30 ENCOUNTER — Encounter: Payer: Medicare Other | Admitting: Neurology

## 2014-06-30 NOTE — Telephone Encounter (Signed)
Pt no showed today's EMG study. No show letter mailed to pt. Referring provider office notified via EP[facility] referral / Sherri S.

## 2014-07-02 ENCOUNTER — Telehealth: Payer: Self-pay | Admitting: Internal Medicine

## 2014-07-02 DIAGNOSIS — E039 Hypothyroidism, unspecified: Secondary | ICD-10-CM

## 2014-07-02 DIAGNOSIS — K219 Gastro-esophageal reflux disease without esophagitis: Secondary | ICD-10-CM

## 2014-07-02 NOTE — Telephone Encounter (Signed)
Pt called in and she is need refills on her   Synthroid  Wellbutrin  Prilosec   [facility] [address]

## 2014-07-03 MED ORDER — LEVOTHYROXINE SODIUM 200 MCG PO TABS: 200.0000 ug | ORAL_TABLET | Freq: Every day | ORAL | Status: DC

## 2014-07-03 MED ORDER — BUPROPION HCL ER (XL) 300 MG PO TB24: 300.0000 mg | ORAL_TABLET | Freq: Every day | ORAL | Status: DC

## 2014-07-03 MED ORDER — OMEPRAZOLE 40 MG PO CPDR: 40.0000 mg | DELAYED_RELEASE_CAPSULE | Freq: Every day | ORAL | Status: DC

## 2014-07-03 NOTE — Telephone Encounter (Signed)
Approved.  

## 2014-07-09 ENCOUNTER — Telehealth: Payer: Self-pay | Admitting: Internal Medicine

## 2014-07-09 DIAGNOSIS — K219 Gastro-esophageal reflux disease without esophagitis: Secondary | ICD-10-CM

## 2014-07-09 MED ORDER — OMEPRAZOLE 40 MG PO CPDR: 80.0000 mg | DELAYED_RELEASE_CAPSULE | Freq: Every day | ORAL | Status: DC

## 2014-07-09 NOTE — Telephone Encounter (Signed)
Pt called in and said that she has had to double up on her Prilosec because she has been having heart burn so bad.  She said that pharmacy will not refill because it is to soon.  She wanted to know if new script could be called in for 2 a daily instead of 1?      Best number [phone] Pharmacy [facility] [address] [place]

## 2014-07-09 NOTE — Telephone Encounter (Signed)
done

## 2014-07-16 ENCOUNTER — Telehealth: Payer: Self-pay | Admitting: Pulmonary Disease

## 2014-07-16 NOTE — Telephone Encounter (Signed)
Spoke with pt. She is requesting a letter from SN (since she has been seeing him for many years) stating she is not "crazy". Pt states social services is involved regarding welfare of her children.  Spoke with SN and he stated if her lawyer contacts him stating what type of medical statement he needs and how he can help, he would be happy to do so but can't provide a letter stating what she is requesting.  Called made pt aware. She verbalized understanding. She understands the process and that we can't provide the letter at this time. Nothing further needed

## 2014-07-29 ENCOUNTER — Telehealth: Payer: Self-pay | Admitting: Internal Medicine

## 2014-07-29 NOTE — Telephone Encounter (Signed)
Patient was referred to Dr. Bing Plume and he does not take Medicare

## 2014-07-31 ENCOUNTER — Other Ambulatory Visit: Payer: Self-pay

## 2014-07-31 DIAGNOSIS — M898X9 Other specified disorders of bone, unspecified site: Secondary | ICD-10-CM

## 2014-07-31 DIAGNOSIS — M8580 Other specified disorders of bone density and structure, unspecified site: Secondary | ICD-10-CM

## 2014-07-31 DIAGNOSIS — IMO0001 Reserved for inherently not codable concepts without codable children: Secondary | ICD-10-CM

## 2014-07-31 MED ORDER — CHOLECALCIFEROL 1.25 MG (50000 UT) PO TABS: 1.0000 | ORAL_TABLET | ORAL | Status: DC

## 2014-08-06 NOTE — Telephone Encounter (Signed)
Left message for patient to call office. Dr. Rachael Fee office accepts Medicare. She needs to call them to reschedule her appt.

## 2014-09-29 ENCOUNTER — Other Ambulatory Visit: Payer: Self-pay

## 2014-09-29 DIAGNOSIS — F418 Other specified anxiety disorders: Secondary | ICD-10-CM

## 2014-09-29 MED ORDER — ALPRAZOLAM 0.5 MG PO TABS: ORAL_TABLET | ORAL | Status: DC

## 2014-09-29 NOTE — Telephone Encounter (Signed)
Incoming fax for RF. Last OV 06/21/2014

## 2014-10-08 ENCOUNTER — Other Ambulatory Visit: Payer: Self-pay

## 2014-10-08 DIAGNOSIS — K219 Gastro-esophageal reflux disease without esophagitis: Secondary | ICD-10-CM

## 2014-10-08 MED ORDER — OMEPRAZOLE 40 MG PO CPDR: 80.0000 mg | DELAYED_RELEASE_CAPSULE | Freq: Every day | ORAL | Status: DC

## 2014-10-18 DIAGNOSIS — Z23 Encounter for immunization: Secondary | ICD-10-CM | POA: Diagnosis not present

## 2014-11-18 ENCOUNTER — Other Ambulatory Visit: Payer: Self-pay

## 2014-11-18 MED ORDER — LEVOTHYROXINE SODIUM 50 MCG PO TABS: 50.0000 ug | ORAL_TABLET | Freq: Every day | ORAL | Status: DC

## 2014-11-24 ENCOUNTER — Telehealth: Payer: Self-pay | Admitting: Internal Medicine

## 2014-11-24 NOTE — Telephone Encounter (Signed)
Per Dr. Ronnald Ramp Tasha [name] needs to be seen as well.

## 2014-11-24 NOTE — Telephone Encounter (Signed)
LMOVM. She has only been seen once since last year. She is overdue for an OV. An appt needs to be made for this

## 2014-11-24 NOTE — Telephone Encounter (Signed)
Pt called in said that she has blisters in her month and wanted Dr Ronnald Ramp to call her in meds.  I told her that she would have to have and office visit but is not wanting to come in.  I explain to her that she would have to be seen in order to get meds.  Janace Hoard can you call her?

## 2014-11-27 ENCOUNTER — Telehealth: Payer: Self-pay | Admitting: Internal Medicine

## 2014-11-27 NOTE — Telephone Encounter (Signed)
scheduled

## 2014-11-27 NOTE — Telephone Encounter (Signed)
[facility]     Patient Name: [facility] Initial Comment Caller states she has a bad cold or allergies.  DOB: Nov 10, 1960      Nurse Assessment  Nurse: Venetia Maxon, RN, Manuela Schwartz Date/Time (Eastern Time): 11/27/2014 4:07:30 PM  Confirm and document reason for call. If symptomatic, describe symptoms. ---Caller states she has a bad cold or allergies. cannot locate thermometer. green mucous / yellow cough and nose. 7 days. sneezing. has allergies.   Has the patient traveled out of the country within the last 30 days? ---No   Does the patient have any new or worsening symptoms? ---Yes   Will a triage be completed? ---Yes   Related visit to physician within the last 2 weeks? ---No   Does the PT have any chronic conditions? (i.e. diabetes, asthma, etc.) ---Yes   List chronic conditions. ---thyroid IBS cholesterol     Guidelines     Guideline Title Affirmed Question Affirmed Notes   Common Cold Cold with no complications (all triage questions negative)    Final Disposition [place], RN, Manuela Schwartz       Disagree/Comply: Comply

## 2014-11-27 NOTE — Telephone Encounter (Signed)
Will you schedule appt for pt to have CPE?

## 2014-12-09 ENCOUNTER — Ambulatory Visit (INDEPENDENT_AMBULATORY_CARE_PROVIDER_SITE_OTHER): Payer: BLUE CROSS/BLUE SHIELD | Admitting: Internal Medicine

## 2014-12-09 ENCOUNTER — Ambulatory Visit (INDEPENDENT_AMBULATORY_CARE_PROVIDER_SITE_OTHER)
Admission: RE | Admit: 2014-12-09 | Discharge: 2014-12-09 | Disposition: A | Discharge status: Home / Self Care | Payer: BLUE CROSS/BLUE SHIELD | Source: Ambulatory Visit | Attending: Internal Medicine | Admitting: Internal Medicine

## 2014-12-09 ENCOUNTER — Encounter: Payer: Self-pay | Admitting: Internal Medicine

## 2014-12-09 VITALS — BP 120/78 | HR 64 | Temp 98.0°F | Resp 16 | Ht 67.0 in | Wt 170.0 lb

## 2014-12-09 DIAGNOSIS — M898X9 Other specified disorders of bone, unspecified site: Secondary | ICD-10-CM | POA: Diagnosis not present

## 2014-12-09 DIAGNOSIS — M609 Myositis, unspecified: Secondary | ICD-10-CM

## 2014-12-09 DIAGNOSIS — E039 Hypothyroidism, unspecified: Secondary | ICD-10-CM

## 2014-12-09 DIAGNOSIS — M791 Myalgia: Secondary | ICD-10-CM | POA: Diagnosis not present

## 2014-12-09 DIAGNOSIS — R05 Cough: Secondary | ICD-10-CM

## 2014-12-09 DIAGNOSIS — M170 Bilateral primary osteoarthritis of knee: Secondary | ICD-10-CM

## 2014-12-09 DIAGNOSIS — R059 Cough, unspecified: Secondary | ICD-10-CM

## 2014-12-09 DIAGNOSIS — IMO0001 Reserved for inherently not codable concepts without codable children: Secondary | ICD-10-CM

## 2014-12-09 DIAGNOSIS — F418 Other specified anxiety disorders: Secondary | ICD-10-CM | POA: Diagnosis not present

## 2014-12-09 DIAGNOSIS — M8580 Other specified disorders of bone density and structure, unspecified site: Secondary | ICD-10-CM

## 2014-12-09 DIAGNOSIS — J209 Acute bronchitis, unspecified: Secondary | ICD-10-CM

## 2014-12-09 MED ORDER — HYDROCODONE-ACETAMINOPHEN 5-325 MG PO TABS: 1.0000 | ORAL_TABLET | Freq: Four times a day (QID) | ORAL | Status: DC | PRN

## 2014-12-09 MED ORDER — CHOLECALCIFEROL 1.25 MG (50000 UT) PO TABS: 1.0000 | ORAL_TABLET | ORAL | Status: DC

## 2014-12-09 MED ORDER — BUPROPION HCL ER (XL) 300 MG PO TB24: 300.0000 mg | ORAL_TABLET | Freq: Every day | ORAL | Status: DC

## 2014-12-09 MED ORDER — PROMETHAZINE-DM 6.25-15 MG/5ML PO SYRP: 5.0000 mL | ORAL_SOLUTION | Freq: Four times a day (QID) | ORAL | Status: DC | PRN

## 2014-12-09 MED ORDER — SERTRALINE HCL 100 MG PO TABS: 100.0000 mg | ORAL_TABLET | Freq: Every day | ORAL | Status: DC

## 2014-12-09 MED ORDER — MOXIFLOXACIN HCL 400 MG PO TABS: 400.0000 mg | ORAL_TABLET | Freq: Every day | ORAL | Status: AC

## 2014-12-09 MED ORDER — EZETIMIBE 10 MG PO TABS: 10.0000 mg | ORAL_TABLET | Freq: Every day | ORAL | Status: DC

## 2014-12-09 MED ORDER — ALPRAZOLAM 0.5 MG PO TABS: ORAL_TABLET | ORAL | Status: DC

## 2014-12-09 MED ORDER — LEVOTHYROXINE SODIUM 200 MCG PO TABS: 200.0000 ug | ORAL_TABLET | Freq: Every day | ORAL | Status: DC

## 2014-12-09 MED ORDER — CYCLOBENZAPRINE HCL 10 MG PO TABS: 10.0000 mg | ORAL_TABLET | Freq: Two times a day (BID) | ORAL | Status: DC | PRN

## 2014-12-09 MED ORDER — MELOXICAM 15 MG PO TABS: 15.0000 mg | ORAL_TABLET | Freq: Every day | ORAL | Status: DC

## 2014-12-09 NOTE — Patient Instructions (Signed)
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
## 2014-12-09 NOTE — Progress Notes (Signed)
Pre visit review using our clinic review tool, if applicable. No additional management support is needed unless otherwise documented below in the visit note. 

## 2014-12-14 ENCOUNTER — Encounter: Payer: Self-pay | Admitting: Internal Medicine

## 2014-12-16 ENCOUNTER — Other Ambulatory Visit (INDEPENDENT_AMBULATORY_CARE_PROVIDER_SITE_OTHER): Payer: BLUE CROSS/BLUE SHIELD

## 2014-12-16 ENCOUNTER — Encounter: Payer: Self-pay | Admitting: Internal Medicine

## 2014-12-16 ENCOUNTER — Ambulatory Visit (INDEPENDENT_AMBULATORY_CARE_PROVIDER_SITE_OTHER): Payer: BLUE CROSS/BLUE SHIELD | Admitting: Internal Medicine

## 2014-12-16 VITALS — BP 120/80 | HR 74 | Temp 98.3°F | Resp 16 | Ht 67.0 in | Wt 170.0 lb

## 2014-12-16 DIAGNOSIS — J040 Acute laryngitis: Secondary | ICD-10-CM | POA: Diagnosis not present

## 2014-12-16 DIAGNOSIS — M170 Bilateral primary osteoarthritis of knee: Secondary | ICD-10-CM

## 2014-12-16 DIAGNOSIS — E038 Other specified hypothyroidism: Secondary | ICD-10-CM | POA: Diagnosis not present

## 2014-12-16 DIAGNOSIS — M791 Myalgia: Secondary | ICD-10-CM

## 2014-12-16 DIAGNOSIS — Z124 Encounter for screening for malignant neoplasm of cervix: Secondary | ICD-10-CM

## 2014-12-16 DIAGNOSIS — M609 Myositis, unspecified: Secondary | ICD-10-CM

## 2014-12-16 DIAGNOSIS — Z0000 Encounter for general adult medical examination without abnormal findings: Secondary | ICD-10-CM | POA: Diagnosis not present

## 2014-12-16 DIAGNOSIS — R413 Other amnesia: Secondary | ICD-10-CM | POA: Insufficient documentation

## 2014-12-16 DIAGNOSIS — L98491 Non-pressure chronic ulcer of skin of other sites limited to breakdown of skin: Secondary | ICD-10-CM | POA: Diagnosis not present

## 2014-12-16 DIAGNOSIS — E118 Type 2 diabetes mellitus with unspecified complications: Secondary | ICD-10-CM

## 2014-12-16 DIAGNOSIS — IMO0001 Reserved for inherently not codable concepts without codable children: Secondary | ICD-10-CM

## 2014-12-16 DIAGNOSIS — Z1231 Encounter for screening mammogram for malignant neoplasm of breast: Secondary | ICD-10-CM | POA: Insufficient documentation

## 2014-12-16 LAB — BASIC METABOLIC PANEL
BUN: 13 mg/dL (ref 6–23)
CO2: 24 mEq/L (ref 19–32)
Calcium: 9.7 mg/dL (ref 8.4–10.5)
Chloride: 103 mEq/L (ref 96–112)
Creatinine, Ser: 0.77 mg/dL (ref 0.40–1.20)
GFR: 82.5 mL/min (ref >60.00)
Glucose, Bld: 118 mg/dL — ABNORMAL HIGH (ref 70–99)
Potassium: 3.8 mEq/L (ref 3.5–5.1)
Sodium: 139 mEq/L (ref 135–145)

## 2014-12-16 LAB — TSH: TSH: 0.12 u[IU]/mL — ABNORMAL LOW (ref 0.35–4.50)

## 2014-12-16 LAB — HEMOGLOBIN A1C: Hgb A1c MFr Bld: 7.5 % — ABNORMAL HIGH (ref 4.6–6.5)

## 2014-12-16 MED ORDER — HYDROCODONE-ACETAMINOPHEN 5-325 MG PO TABS: 1.0000 | ORAL_TABLET | Freq: Four times a day (QID) | ORAL | Status: DC | PRN

## 2014-12-16 NOTE — Patient Instructions (Signed)
Preventive Care for Adults, Female A healthy lifestyle and preventive care can promote health and wellness. Preventive health guidelines for women include the following key practices.  A routine yearly physical is a good way to check with your health care provider about your health and preventive screening. It is a chance to share any concerns and updates on your health and to receive a thorough exam.  Visit your dentist for a routine exam and preventive care every 6 months. Brush your teeth twice a day and floss once a day. Good oral hygiene prevents tooth decay and gum disease.  The frequency of eye exams is based on your age, health, family medical history, use of contact lenses, and other factors. Follow your health care provider's recommendations for frequency of eye exams.  Eat a healthy diet. Foods like vegetables, fruits, whole grains, low-fat dairy products, and lean protein foods contain the nutrients you need without too many calories. Decrease your intake of foods high in solid fats, added sugars, and salt. Eat the right amount of calories for you.Get information about a proper diet from your health care provider, if necessary.  Regular physical exercise is one of the most important things you can do for your health. Most adults should get at least 150 minutes of moderate-intensity exercise (any activity that increases your heart rate and causes you to sweat) each week. In addition, most adults need muscle-strengthening exercises on 2 or more days a week.  Maintain a healthy weight. The body mass index (BMI) is a screening tool to identify possible weight problems. It provides an estimate of body fat based on height and weight. Your health care provider can find your BMI and can help you achieve or maintain a healthy weight.For adults 20 years and older:  A BMI below 18.5 is considered underweight.  A BMI of 18.5 to 24.9 is normal.  A BMI of 25 to 29.9 is considered overweight.  A  BMI of 30 and above is considered obese.  Maintain normal blood lipids and cholesterol levels by exercising and minimizing your intake of saturated fat. Eat a balanced diet with plenty of fruit and vegetables. Blood tests for lipids and cholesterol should begin at age 45 and be repeated every 5 years. If your lipid or cholesterol levels are high, you are over 50, or you are at high risk for heart disease, you may need your cholesterol levels checked more frequently.Ongoing high lipid and cholesterol levels should be treated with medicines if diet and exercise are not working.  If you smoke, find out from your health care provider how to quit. If you do not use tobacco, do not start.  Lung cancer screening is recommended for adults aged 45-80 years who are at high risk for developing lung cancer because of a history of smoking. A yearly low-dose CT scan of the lungs is recommended for people who have at least a 30-pack-year history of smoking and are a current smoker or have quit within the past 15 years. A pack year of smoking is smoking an average of 1 pack of cigarettes a day for 1 year (for example: 1 pack a day for 30 years or 2 packs a day for 15 years). Yearly screening should continue until the smoker has stopped smoking for at least 15 years. Yearly screening should be stopped for people who develop a health problem that would prevent them from having lung cancer treatment.  If you are pregnant, do not drink alcohol. If you are  breastfeeding, be very cautious about drinking alcohol. If you are not pregnant and choose to drink alcohol, do not have more than 1 drink per day. One drink is considered to be 12 ounces (355 mL) of beer, 5 ounces (148 mL) of wine, or 1.5 ounces (44 mL) of liquor.  Avoid use of street drugs. Do not share needles with anyone. Ask for help if you need support or instructions about stopping the use of drugs.  High blood pressure causes heart disease and increases the risk  of stroke. Your blood pressure should be checked at least every 1 to 2 years. Ongoing high blood pressure should be treated with medicines if weight loss and exercise do not work.  If you are 55-79 years old, ask your health care provider if you should take aspirin to prevent strokes.  Diabetes screening is done by taking a blood sample to check your blood glucose level after you have not eaten for a certain period of time (fasting). If you are not overweight and you do not have risk factors for diabetes, you should be screened once every 3 years starting at age 45. If you are overweight or obese and you are 40-70 years of age, you should be screened for diabetes every year as part of your cardiovascular risk assessment.  Breast cancer screening is essential preventive care for women. You should practice "breast self-awareness." This means understanding the normal appearance and feel of your breasts and may include breast self-examination. Any changes detected, no matter how small, should be reported to a health care provider. Women in their 20s and 30s should have a clinical breast exam (CBE) by a health care provider as part of a regular health exam every 1 to 3 years. After age 40, women should have a CBE every year. Starting at age 40, women should consider having a mammogram (breast X-ray test) every year. Women who have a family history of breast cancer should talk to their health care provider about genetic screening. Women at a high risk of breast cancer should talk to their health care providers about having an MRI and a mammogram every year.  Breast cancer gene (BRCA)-related cancer risk assessment is recommended for women who have family members with BRCA-related cancers. BRCA-related cancers include breast, ovarian, tubal, and peritoneal cancers. Having family members with these cancers may be associated with an increased risk for harmful changes (mutations) in the breast cancer genes BRCA1 and  BRCA2. Results of the assessment will determine the need for genetic counseling and BRCA1 and BRCA2 testing.  Your health care provider may recommend that you be screened regularly for cancer of the pelvic organs (ovaries, uterus, and vagina). This screening involves a pelvic examination, including checking for microscopic changes to the surface of your cervix (Pap test). You may be encouraged to have this screening done every 3 years, beginning at age 21.  For women ages 30-65, health care providers may recommend pelvic exams and Pap testing every 3 years, or they may recommend the Pap and pelvic exam, combined with testing for human papilloma virus (HPV), every 5 years. Some types of HPV increase your risk of cervical cancer. Testing for HPV may also be done on women of any age with unclear Pap test results.  Other health care providers may not recommend any screening for nonpregnant women who are considered low risk for pelvic cancer and who do not have symptoms. Ask your health care provider if a screening pelvic exam is right for   you.  If you have had past treatment for cervical cancer or a condition that could lead to cancer, you need Pap tests and screening for cancer for at least 20 years after your treatment. If Pap tests have been discontinued, your risk factors (such as having a new sexual partner) need to be reassessed to determine if screening should resume. Some women have medical problems that increase the chance of getting cervical cancer. In these cases, your health care provider may recommend more frequent screening and Pap tests.  Colorectal cancer can be detected and often prevented. Most routine colorectal cancer screening begins at the age of 50 years and continues through age 75 years. However, your health care provider may recommend screening at an earlier age if you have risk factors for colon cancer. On a yearly basis, your health care provider may provide home test kits to check  for hidden blood in the stool. Use of a small camera at the end of a tube, to directly examine the colon (sigmoidoscopy or colonoscopy), can detect the earliest forms of colorectal cancer. Talk to your health care provider about this at age 50, when routine screening begins. Direct exam of the colon should be repeated every 5-10 years through age 75 years, unless early forms of precancerous polyps or small growths are found.  People who are at an increased risk for hepatitis B should be screened for this virus. You are considered at high risk for hepatitis B if:  You were born in a country where hepatitis B occurs often. Talk with your health care provider about which countries are considered high risk.  Your parents were born in a high-risk country and you have not received a shot to protect against hepatitis B (hepatitis B vaccine).  You have HIV or AIDS.  You use needles to inject street drugs.  You live with, or have sex with, someone who has hepatitis B.  You get hemodialysis treatment.  You take certain medicines for conditions like cancer, organ transplantation, and autoimmune conditions.  Hepatitis C blood testing is recommended for all people born from 1945 through 1965 and any individual with known risks for hepatitis C.  Practice safe sex. Use condoms and avoid high-risk sexual practices to reduce the spread of sexually transmitted infections (STIs). STIs include gonorrhea, chlamydia, syphilis, trichomonas, herpes, HPV, and human immunodeficiency virus (HIV). Herpes, HIV, and HPV are viral illnesses that have no cure. They can result in disability, cancer, and death.  You should be screened for sexually transmitted illnesses (STIs) including gonorrhea and chlamydia if:  You are sexually active and are younger than 24 years.  You are older than 24 years and your health care provider tells you that you are at risk for this type of infection.  Your sexual activity has changed  since you were last screened and you are at an increased risk for chlamydia or gonorrhea. Ask your health care provider if you are at risk.  If you are at risk of being infected with HIV, it is recommended that you take a prescription medicine daily to prevent HIV infection. This is called preexposure prophylaxis (PrEP). You are considered at risk if:  You are sexually active and do not regularly use condoms or know the HIV status of your partner(s).  You take drugs by injection.  You are sexually active with a partner who has HIV.  Talk with your health care provider about whether you are at high risk of being infected with HIV. If   you choose to begin PrEP, you should first be tested for HIV. You should then be tested every 3 months for as long as you are taking PrEP.  Osteoporosis is a disease in which the bones lose minerals and strength with aging. This can result in serious bone fractures or breaks. The risk of osteoporosis can be identified using a bone density scan. Women ages 67 years and over and women at risk for fractures or osteoporosis should discuss screening with their health care providers. Ask your health care provider whether you should take a calcium supplement or vitamin D to reduce the rate of osteoporosis.  Menopause can be associated with physical symptoms and risks. Hormone replacement therapy is available to decrease symptoms and risks. You should talk to your health care provider about whether hormone replacement therapy is right for you.  Use sunscreen. Apply sunscreen liberally and repeatedly throughout the day. You should seek shade when your shadow is shorter than you. Protect yourself by wearing long sleeves, pants, a wide-brimmed hat, and sunglasses year round, whenever you are outdoors.  Once a month, do a whole body skin exam, using a mirror to look at the skin on your back. Tell your health care provider of new moles, moles that have irregular borders, moles that  are larger than a pencil eraser, or moles that have changed in shape or color.  Stay current with required vaccines (immunizations).  Influenza vaccine. All adults should be immunized every year.  Tetanus, diphtheria, and acellular pertussis (Td, Tdap) vaccine. Pregnant women should receive 1 dose of Tdap vaccine during each pregnancy. The dose should be obtained regardless of the length of time since the last dose. Immunization is preferred during the 27th-36th week of gestation. An adult who has not previously received Tdap or who does not know her vaccine status should receive 1 dose of Tdap. This initial dose should be followed by tetanus and diphtheria toxoids (Td) booster doses every 10 years. Adults with an unknown or incomplete history of completing a 3-dose immunization series with Td-containing vaccines should begin or complete a primary immunization series including a Tdap dose. Adults should receive a Td booster every 10 years.  Varicella vaccine. An adult without evidence of immunity to varicella should receive 2 doses or a second dose if she has previously received 1 dose. Pregnant females who do not have evidence of immunity should receive the first dose after pregnancy. This first dose should be obtained before leaving the health care facility. The second dose should be obtained 4-8 weeks after the first dose.  Human papillomavirus (HPV) vaccine. Females aged 13-26 years who have not received the vaccine previously should obtain the 3-dose series. The vaccine is not recommended for use in pregnant females. However, pregnancy testing is not needed before receiving a dose. If a female is found to be pregnant after receiving a dose, no treatment is needed. In that case, the remaining doses should be delayed until after the pregnancy. Immunization is recommended for any person with an immunocompromised condition through the age of 61 years if she did not get any or all doses earlier. During the  3-dose series, the second dose should be obtained 4-8 weeks after the first dose. The third dose should be obtained 24 weeks after the first dose and 16 weeks after the second dose.  Zoster vaccine. One dose is recommended for adults aged 30 years or older unless certain conditions are present.  Measles, mumps, and rubella (MMR) vaccine. Adults born  before 1957 generally are considered immune to measles and mumps. Adults born in 1957 or later should have 1 or more doses of MMR vaccine unless there is a contraindication to the vaccine or there is laboratory evidence of immunity to each of the three diseases. A routine second dose of MMR vaccine should be obtained at least 28 days after the first dose for students attending postsecondary schools, health care workers, or international travelers. People who received inactivated measles vaccine or an unknown type of measles vaccine during 1963-1967 should receive 2 doses of MMR vaccine. People who received inactivated mumps vaccine or an unknown type of mumps vaccine before 1979 and are at high risk for mumps infection should consider immunization with 2 doses of MMR vaccine. For females of childbearing age, rubella immunity should be determined. If there is no evidence of immunity, females who are not pregnant should be vaccinated. If there is no evidence of immunity, females who are pregnant should delay immunization until after pregnancy. Unvaccinated health care workers born before 1957 who lack laboratory evidence of measles, mumps, or rubella immunity or laboratory confirmation of disease should consider measles and mumps immunization with 2 doses of MMR vaccine or rubella immunization with 1 dose of MMR vaccine.  Pneumococcal 13-valent conjugate (PCV13) vaccine. When indicated, a person who is uncertain of his immunization history and has no record of immunization should receive the PCV13 vaccine. All adults 65 years of age and older should receive this  vaccine. An adult aged 19 years or older who has certain medical conditions and has not been previously immunized should receive 1 dose of PCV13 vaccine. This PCV13 should be followed with a dose of pneumococcal polysaccharide (PPSV23) vaccine. Adults who are at high risk for pneumococcal disease should obtain the PPSV23 vaccine at least 8 weeks after the dose of PCV13 vaccine. Adults older than 55 years of age who have normal immune system function should obtain the PPSV23 vaccine dose at least 1 year after the dose of PCV13 vaccine.  Pneumococcal polysaccharide (PPSV23) vaccine. When PCV13 is also indicated, PCV13 should be obtained first. All adults aged 65 years and older should be immunized. An adult younger than age 65 years who has certain medical conditions should be immunized. Any person who resides in a nursing home or long-term care facility should be immunized. An adult smoker should be immunized. People with an immunocompromised condition and certain other conditions should receive both PCV13 and PPSV23 vaccines. People with human immunodeficiency virus (HIV) infection should be immunized as soon as possible after diagnosis. Immunization during chemotherapy or radiation therapy should be avoided. Routine use of PPSV23 vaccine is not recommended for American Indians, [place] Natives, or people younger than 65 years unless there are medical conditions that require PPSV23 vaccine. When indicated, people who have unknown immunization and have no record of immunization should receive PPSV23 vaccine. One-time revaccination 5 years after the first dose of PPSV23 is recommended for people aged 19-64 years who have chronic kidney failure, nephrotic syndrome, asplenia, or immunocompromised conditions. People who received 1-2 doses of PPSV23 before age 65 years should receive another dose of PPSV23 vaccine at age 65 years or later if at least 5 years have passed since the previous dose. Doses of PPSV23 are not  needed for people immunized with PPSV23 at or after age 65 years.  Meningococcal vaccine. Adults with asplenia or persistent complement component deficiencies should receive 2 doses of quadrivalent meningococcal conjugate (MenACWY-D) vaccine. The doses should be obtained   at least 2 months apart. Microbiologists working with certain meningococcal bacteria, [place] recruits, people at risk during an outbreak, and people who travel to or live in countries with a high rate of meningitis should be immunized. A first-year college student up through age 34 years who is living in a residence hall should receive a dose if she did not receive a dose on or after her 16th birthday. Adults who have certain high-risk conditions should receive one or more doses of vaccine.  Hepatitis A vaccine. Adults who wish to be protected from this disease, have certain high-risk conditions, work with hepatitis A-infected animals, work in hepatitis A research labs, or travel to or work in countries with a high rate of hepatitis A should be immunized. Adults who were previously unvaccinated and who anticipate close contact with an international adoptee during the first 60 days after arrival in the [place] from a country with a high rate of hepatitis A should be immunized.  Hepatitis B vaccine. Adults who wish to be protected from this disease, have certain high-risk conditions, may be exposed to blood or other infectious body fluids, are household contacts or sex partners of hepatitis B positive people, are clients or workers in certain care facilities, or travel to or work in countries with a high rate of hepatitis B should be immunized.  Haemophilus influenzae type b (Hib) vaccine. A previously unvaccinated person with asplenia or sickle cell disease or having a scheduled splenectomy should receive 1 dose of Hib vaccine. Regardless of previous immunization, a recipient of a hematopoietic stem cell transplant should receive a  3-dose series 6-12 months after her successful transplant. Hib vaccine is not recommended for adults with HIV infection. Preventive Services / Frequency Ages 35 to 4 years  Blood pressure check.** / Every 3-5 years.  Lipid and cholesterol check.** / Every 5 years beginning at age 60.  Clinical breast exam.** / Every 3 years for women in their 71s and 10s.  BRCA-related cancer risk assessment.** / For women who have family members with a BRCA-related cancer (breast, ovarian, tubal, or peritoneal cancers).  Pap test.** / Every 2 years from ages 76 through 26. Every 3 years starting at age 61 through age 76 or 93 with a history of 3 consecutive normal Pap tests.  HPV screening.** / Every 3 years from ages 37 through ages 60 to 51 with a history of 3 consecutive normal Pap tests.  Hepatitis C blood test.** / For any individual with known risks for hepatitis C.  Skin self-exam. / Monthly.  Influenza vaccine. / Every year.  Tetanus, diphtheria, and acellular pertussis (Tdap, Td) vaccine.** / Consult your health care provider. Pregnant women should receive 1 dose of Tdap vaccine during each pregnancy. 1 dose of Td every 10 years.  Varicella vaccine.** / Consult your health care provider. Pregnant females who do not have evidence of immunity should receive the first dose after pregnancy.  HPV vaccine. / 3 doses over 6 months, if 93 and younger. The vaccine is not recommended for use in pregnant females. However, pregnancy testing is not needed before receiving a dose.  Measles, mumps, rubella (MMR) vaccine.** / You need at least 1 dose of MMR if you were born in 1957 or later. You may also need a 2nd dose. For females of childbearing age, rubella immunity should be determined. If there is no evidence of immunity, females who are not pregnant should be vaccinated. If there is no evidence of immunity, females who are  pregnant should delay immunization until after pregnancy.  Pneumococcal  13-valent conjugate (PCV13) vaccine.** / Consult your health care provider.  Pneumococcal polysaccharide (PPSV23) vaccine.** / 1 to 2 doses if you smoke cigarettes or if you have certain conditions.  Meningococcal vaccine.** / 1 dose if you are age 68 to 8 years and a Market researcher living in a residence hall, or have one of several medical conditions, you need to get vaccinated against meningococcal disease. You may also need additional booster doses.  Hepatitis A vaccine.** / Consult your health care provider.  Hepatitis B vaccine.** / Consult your health care provider.  Haemophilus influenzae type b (Hib) vaccine.** / Consult your health care provider. Ages 7 to 53 years  Blood pressure check.** / Every year.  Lipid and cholesterol check.** / Every 5 years beginning at age 25 years.  Lung cancer screening. / Every year if you are aged 11-80 years and have a 30-pack-year history of smoking and currently smoke or have quit within the past 15 years. Yearly screening is stopped once you have quit smoking for at least 15 years or develop a health problem that would prevent you from having lung cancer treatment.  Clinical breast exam.** / Every year after age 48 years.  BRCA-related cancer risk assessment.** / For women who have family members with a BRCA-related cancer (breast, ovarian, tubal, or peritoneal cancers).  Mammogram.** / Every year beginning at age 41 years and continuing for as long as you are in good health. Consult with your health care provider.  Pap test.** / Every 3 years starting at age 65 years through age 37 or 70 years with a history of 3 consecutive normal Pap tests.  HPV screening.** / Every 3 years from ages 72 years through ages 60 to 40 years with a history of 3 consecutive normal Pap tests.  Fecal occult blood test (FOBT) of stool. / Every year beginning at age 21 years and continuing until age 5 years. You may not need to do this test if you get  a colonoscopy every 10 years.  Flexible sigmoidoscopy or colonoscopy.** / Every 5 years for a flexible sigmoidoscopy or every 10 years for a colonoscopy beginning at age 35 years and continuing until age 48 years.  Hepatitis C blood test.** / For all people born from [birth year] through 1965 and any individual with known risks for hepatitis C.  Skin self-exam. / Monthly.  Influenza vaccine. / Every year.  Tetanus, diphtheria, and acellular pertussis (Tdap/Td) vaccine.** / Consult your health care provider. Pregnant women should receive 1 dose of Tdap vaccine during each pregnancy. 1 dose of Td every 10 years.  Varicella vaccine.** / Consult your health care provider. Pregnant females who do not have evidence of immunity should receive the first dose after pregnancy.  Zoster vaccine.** / 1 dose for adults aged 30 years or older.  Measles, mumps, rubella (MMR) vaccine.** / You need at least 1 dose of MMR if you were born in 1957 or later. You may also need a second dose. For females of childbearing age, rubella immunity should be determined. If there is no evidence of immunity, females who are not pregnant should be vaccinated. If there is no evidence of immunity, females who are pregnant should delay immunization until after pregnancy.  Pneumococcal 13-valent conjugate (PCV13) vaccine.** / Consult your health care provider.  Pneumococcal polysaccharide (PPSV23) vaccine.** / 1 to 2 doses if you smoke cigarettes or if you have certain conditions.  Meningococcal vaccine.** /  Consult your health care provider.  Hepatitis A vaccine.** / Consult your health care provider.  Hepatitis B vaccine.** / Consult your health care provider.  Haemophilus influenzae type b (Hib) vaccine.** / Consult your health care provider. Ages 64 years and over  Blood pressure check.** / Every year.  Lipid and cholesterol check.** / Every 5 years beginning at age 23 years.  Lung cancer screening. / Every year if you  are aged 16-80 years and have a 30-pack-year history of smoking and currently smoke or have quit within the past 15 years. Yearly screening is stopped once you have quit smoking for at least 15 years or develop a health problem that would prevent you from having lung cancer treatment.  Clinical breast exam.** / Every year after age 74 years.  BRCA-related cancer risk assessment.** / For women who have family members with a BRCA-related cancer (breast, ovarian, tubal, or peritoneal cancers).  Mammogram.** / Every year beginning at age 44 years and continuing for as long as you are in good health. Consult with your health care provider.  Pap test.** / Every 3 years starting at age 58 years through age 22 or 39 years with 3 consecutive normal Pap tests. Testing can be stopped between 65 and 70 years with 3 consecutive normal Pap tests and no abnormal Pap or HPV tests in the past 10 years.  HPV screening.** / Every 3 years from ages 64 years through ages 70 or 61 years with a history of 3 consecutive normal Pap tests. Testing can be stopped between 65 and 70 years with 3 consecutive normal Pap tests and no abnormal Pap or HPV tests in the past 10 years.  Fecal occult blood test (FOBT) of stool. / Every year beginning at age 40 years and continuing until age 27 years. You may not need to do this test if you get a colonoscopy every 10 years.  Flexible sigmoidoscopy or colonoscopy.** / Every 5 years for a flexible sigmoidoscopy or every 10 years for a colonoscopy beginning at age 7 years and continuing until age 32 years.  Hepatitis C blood test.** / For all people born from [birth year] through 1966 and any individual with known risks for hepatitis C.  Osteoporosis screening.** / A one-time screening for women ages 30 years and over and women at risk for fractures or osteoporosis.  Skin self-exam. / Monthly.  Influenza vaccine. / Every year.  Tetanus, diphtheria, and acellular pertussis (Tdap/Td)  vaccine.** / 1 dose of Td every 10 years.  Varicella vaccine.** / Consult your health care provider.  Zoster vaccine.** / 1 dose for adults aged 35 years or older.  Pneumococcal 13-valent conjugate (PCV13) vaccine.** / Consult your health care provider.  Pneumococcal polysaccharide (PPSV23) vaccine.** / 1 dose for all adults aged 46 years and older.  Meningococcal vaccine.** / Consult your health care provider.  Hepatitis A vaccine.** / Consult your health care provider.  Hepatitis B vaccine.** / Consult your health care provider.  Haemophilus influenzae type b (Hib) vaccine.** / Consult your health care provider. ** Family history and personal history of risk and conditions may change your health care provider's recommendations.   This information is not intended to replace advice given to you by your health care provider. Make sure you discuss any questions you have with your health care provider.   Document Released: 03/21/2001 Document Revised: 02/13/2014 Document Reviewed: 06/20/2010 Elsevier Interactive Patient Education [employer].

## 2014-12-16 NOTE — Progress Notes (Signed)
Pre visit review using our clinic review tool, if applicable. No additional management support is needed unless otherwise documented below in the visit note. 

## 2014-12-20 DIAGNOSIS — Z0000 Encounter for general adult medical examination without abnormal findings: Secondary | ICD-10-CM | POA: Insufficient documentation

## 2014-12-20 NOTE — Progress Notes (Signed)
Subjective:  Patient ID: Tasha [name], female    DOB: 1960/06/22  Age: 54 y.o. MRN: [id number]  CC: Cough; Annual Exam; and Hypothyroidism   HPI Tasha [name] presents for a complete physical as well as a follow-up on her cough. The cough has improved but now she complains of laryngitis and worries that she has edema in her larynx which has happened to her before. She is also complaining of memory loss. I told her that I am concerned that her medications may be causing the memory loss and forgetfulness but she is not interested in discontinuing or lowering the doses of her medications.    Outpatient Prescriptions Prior to Visit  Medication Sig Dispense Refill  . acetaminophen (TYLENOL) 500 MG tablet Take 1,000 mg by mouth every 6 (six) hours as needed for headache.    . ALPRAZolam (XANAX) 0.5 MG tablet TAKE 1 TABLET BY MOUTH 4 TIMES DAILY AS NEEDED FOR ANXIETY 120 tablet 1  . buPROPion (WELLBUTRIN XL) 300 MG 24 hr tablet Take 1 tablet (300 mg total) by mouth daily. 30 tablet 6  . Cholecalciferol 50000 UNITS TABS Take 1 tablet by mouth once a week. 12 tablet 3  . cyclobenzaprine (FLEXERIL) 10 MG tablet Take 1 tablet (10 mg total) by mouth 2 (two) times daily as needed for muscle spasms. 20 tablet 0  . ezetimibe (ZETIA) 10 MG tablet Take 1 tablet (10 mg total) by mouth daily. 90 tablet 3  . levothyroxine (SYNTHROID) 200 MCG tablet Take 1 tablet (200 mcg total) by mouth daily before breakfast. 90 tablet 0  . meloxicam (MOBIC) 15 MG tablet Take 1 tablet (15 mg total) by mouth daily. 30 tablet 6  . omeprazole (PRILOSEC) 40 MG capsule Take 2 capsules (80 mg total) by mouth daily. 180 capsule 1  . promethazine-dextromethorphan (PROMETHAZINE-DM) 6.25-15 MG/5ML syrup Take 5 mLs by mouth 4 (four) times daily as needed for cough. 118 mL 0  . sertraline (ZOLOFT) 100 MG tablet Take 1 tablet (100 mg total) by mouth daily. 90 tablet 3  . HYDROcodone-acetaminophen (NORCO/VICODIN) 5-325 MG tablet Take 1  tablet by mouth every 6 (six) hours as needed. Not to exceed 4 tablets a day 120 tablet 0  . levothyroxine (SYNTHROID, LEVOTHROID) 50 MCG tablet Take 1 tablet (50 mcg total) by mouth daily. 90 tablet 1   No facility-administered medications prior to visit.    ROS Review of Systems  Constitutional: Negative.  Negative for fever, chills, diaphoresis, activity change, appetite change, fatigue and unexpected weight change.  HENT: Positive for voice change. Negative for congestion, facial swelling, sore throat and trouble swallowing.   Eyes: Negative.   Respiratory: Positive for cough. Negative for choking, chest tightness, shortness of breath, wheezing and stridor.   Cardiovascular: Negative.  Negative for chest pain, palpitations and leg swelling.  Gastrointestinal: Negative.  Negative for nausea, vomiting, abdominal pain, diarrhea, constipation and blood in stool.  Endocrine: Negative.   Genitourinary: Negative.  Negative for dysuria, urgency, hematuria, flank pain, decreased urine volume, enuresis and difficulty urinating.  Musculoskeletal: Positive for myalgias and arthralgias. Negative for back pain, joint swelling and neck pain.  Skin: Negative.  Negative for rash.       She is concerned about calluses on her feet  Allergic/Immunologic: Negative.   Neurological: Negative.  Negative for dizziness, tremors, syncope, light-headedness, numbness and headaches.  Hematological: Negative.  Negative for adenopathy. Does not bruise/bleed easily.  Psychiatric/Behavioral: Positive for sleep disturbance and decreased concentration. Negative for behavioral  problems and dysphoric mood. The patient is nervous/anxious.     Objective:  BP 120/80 mmHg  Pulse 74  Temp(Src) 98.3 F (36.8 C) (Oral)  Resp 16  Ht 5\' 7"  (1.702 m)  Wt 170 lb (77.111 kg)  BMI 26.62 kg/m2  SpO2 97%  BP Readings from Last 3 Encounters:  12/16/14 120/80  12/09/14 120/78  06/22/14 116/68    Wt Readings from Last 3  Encounters:  12/16/14 170 lb (77.111 kg)  12/09/14 170 lb (77.111 kg)  06/22/14 168 lb (76.204 kg)    Physical Exam  Constitutional: She is oriented to person, place, and time. She appears well-developed and well-nourished. No distress.  HENT:  Head: Normocephalic and atraumatic.  Mouth/Throat: Oropharynx is clear and moist. No oropharyngeal exudate.  Eyes: Conjunctivae are normal. Right eye exhibits no discharge. Left eye exhibits no discharge. No scleral icterus.  Neck: Normal range of motion. Neck supple. No JVD present. No tracheal deviation present. No thyromegaly present.  Cardiovascular: Normal rate, regular rhythm, normal heart sounds and intact distal pulses.  Exam reveals no gallop and no friction rub.   No murmur heard. Pulmonary/Chest: Effort normal and breath sounds normal. No stridor. No respiratory distress. She has no decreased breath sounds. She has no wheezes. She has no rhonchi. She has no rales. She exhibits no tenderness.  Abdominal: Soft. Bowel sounds are normal. She exhibits no distension and no mass. There is no tenderness. There is no rebound and no guarding.  Musculoskeletal: Normal range of motion. She exhibits no edema or tenderness.  Lymphadenopathy:    She has no cervical adenopathy.  Neurological: She is oriented to person, place, and time.  Skin: Skin is warm and dry. No rash noted. She is not diaphoretic. No erythema. No pallor.  She has calluses on her feet  Psychiatric: She has a normal mood and affect. Her behavior is normal. Judgment and thought content normal.  Vitals reviewed.   Lab Results  Component Value Date   WBC 8.9 04/22/2013   HGB 14.6 04/22/2013   HCT 43.6 04/22/2013   PLT 219.0 04/22/2013   GLUCOSE 118* 12/16/2014   CHOL 196 06/22/2014   TRIG 144.0 06/22/2014   HDL 40.50 06/22/2014   LDLDIRECT 158.0 12/01/2013   LDLCALC 127* 06/22/2014   ALT 34 05/27/2014   AST 34 05/27/2014   NA 139 12/16/2014   K 3.8 12/16/2014   CL 103  12/16/2014   CREATININE 0.77 12/16/2014   BUN 13 12/16/2014   CO2 24 12/16/2014   TSH 0.12* 12/16/2014   HGBA1C 7.5* 12/16/2014    Dg Chest 2 View  12/09/2014  CLINICAL DATA:  Cough, substernal chest pain, congestion and wheezing, current smoker. EXAM: CHEST  2 VIEW COMPARISON:  PA and lateral chest x-ray of December 04, 2013 FINDINGS: The lungs are adequately inflated. The interstitial markings are coarse but stable. There is no alveolar infiltrate or pleural effusion. The heart and pulmonary vascularity are normal. The mediastinum is normal in width. There is gentle dextrocurvature of the mid thoracic spine. IMPRESSION: Chronic bronchitic changes. There is no active cardiopulmonary disease. Electronically Signed   By: David  Martinique M.D.   On: 12/09/2014 12:37    Assessment & Plan:   Tasha [name] was seen today for cough, annual exam and hypothyroidism.  Diagnoses and all orders for this visit:  Memory loss or impairment- I have encouraged her to consider a dose decrease or discontinuation of some of her narcotic medications. She agreed to consider this. -  Ambulatory referral to Neurology  Callous ulcer, limited to breakdown of skin [facility]) -     Ambulatory referral to Podiatry  Laryngitis- she may need to undergo laryngoscopy to see if she has edema. Will refer to ENT -     Ambulatory referral to ENT  Type 2 diabetes mellitus with complication, without long-term current use of insulin ([place])- her blood sugars have gone up as her A1c is up to 7.5%. She is not willing to start any medications at this time so she agrees to work on her lifestyle modifications. -     Ambulatory referral to Ophthalmology -     Hemoglobin A1c; Future -     Basic metabolic panel; Future  Other specified hypothyroidism- her TSH is suppressed so I have lowered her Synthroid dose -     TSH; Future  Visit for screening mammogram -     MM DIGITAL SCREENING BILATERAL; Future  Cervical cancer screening -      Ambulatory referral to Gynecology  Myalgia and myositis -     HYDROcodone-acetaminophen (NORCO/VICODIN) 5-325 MG tablet; Take 1 tablet by mouth every 6 (six) hours as needed. Not to exceed 4 tablets a day  Primary osteoarthritis of both knees -     HYDROcodone-acetaminophen (NORCO/VICODIN) 5-325 MG tablet; Take 1 tablet by mouth every 6 (six) hours as needed. Not to exceed 4 tablets a day   I am having Ms. Laabs maintain her acetaminophen, omeprazole, ALPRAZolam, Cholecalciferol, cyclobenzaprine, levothyroxine, sertraline, ezetimibe, meloxicam, buPROPion, promethazine-dextromethorphan, Vitamin D3, and HYDROcodone-acetaminophen.  Meds ordered this encounter  Medications  . Cholecalciferol (VITAMIN D3) 50000 UNITS CAPS    Sig: TAKE 1 TABLET BY MOUTH ONCE A WEEK.    Refill:  3  . HYDROcodone-acetaminophen (NORCO/VICODIN) 5-325 MG tablet    Sig: Take 1 tablet by mouth every 6 (six) hours as needed. Not to exceed 4 tablets a day    Dispense:  120 tablet    Refill:  0    Fill on or after 01/08/15     Follow-up: Return in about 4 months (around 04/15/2015).  [name], MD

## 2014-12-22 ENCOUNTER — Encounter: Payer: Self-pay | Admitting: Internal Medicine

## 2015-01-06 ENCOUNTER — Ambulatory Visit: Payer: BLUE CROSS/BLUE SHIELD | Admitting: Podiatry

## 2015-01-13 ENCOUNTER — Ambulatory Visit (INDEPENDENT_AMBULATORY_CARE_PROVIDER_SITE_OTHER): Payer: BLUE CROSS/BLUE SHIELD | Admitting: Podiatry

## 2015-01-13 NOTE — Progress Notes (Signed)
Patient ID: Tasha [name], female   DOB: [birth year], 54 y.o.   MRN: [id number] No-show

## 2015-01-14 ENCOUNTER — Telehealth: Payer: Self-pay | Admitting: *Deleted

## 2015-01-14 DIAGNOSIS — M170 Bilateral primary osteoarthritis of knee: Secondary | ICD-10-CM

## 2015-01-14 DIAGNOSIS — IMO0001 Reserved for inherently not codable concepts without codable children: Secondary | ICD-10-CM

## 2015-01-14 NOTE — Telephone Encounter (Signed)
Called pt no answer LMOM with md response.../lmb 

## 2015-01-14 NOTE — Telephone Encounter (Signed)
See previous Rx She already has a Dec prescription for this

## 2015-01-14 NOTE — Telephone Encounter (Signed)
Left msg on triage requesting refill on her hydrocodone.../lmb 

## 2015-01-18 ENCOUNTER — Encounter: Payer: Self-pay | Admitting: Gynecology

## 2015-01-18 ENCOUNTER — Other Ambulatory Visit (HOSPITAL_COMMUNITY)
Admission: RE | Admit: 2015-01-18 | Discharge: 2015-01-18 | Disposition: A | Discharge status: Home / Self Care | Payer: BLUE CROSS/BLUE SHIELD | Source: Ambulatory Visit | Attending: Gynecology | Admitting: Gynecology

## 2015-01-18 ENCOUNTER — Ambulatory Visit (INDEPENDENT_AMBULATORY_CARE_PROVIDER_SITE_OTHER): Payer: BLUE CROSS/BLUE SHIELD | Admitting: Gynecology

## 2015-01-18 VITALS — BP 130/78 | Ht 67.0 in | Wt 170.0 lb

## 2015-01-18 DIAGNOSIS — N898 Other specified noninflammatory disorders of vagina: Secondary | ICD-10-CM | POA: Diagnosis not present

## 2015-01-18 DIAGNOSIS — N760 Acute vaginitis: Secondary | ICD-10-CM | POA: Diagnosis not present

## 2015-01-18 DIAGNOSIS — A499 Bacterial infection, unspecified: Secondary | ICD-10-CM | POA: Diagnosis not present

## 2015-01-18 DIAGNOSIS — Z1151 Encounter for screening for human papillomavirus (HPV): Secondary | ICD-10-CM | POA: Insufficient documentation

## 2015-01-18 DIAGNOSIS — B9689 Other specified bacterial agents as the cause of diseases classified elsewhere: Secondary | ICD-10-CM

## 2015-01-18 DIAGNOSIS — N3000 Acute cystitis without hematuria: Secondary | ICD-10-CM | POA: Diagnosis not present

## 2015-01-18 DIAGNOSIS — J387 Other diseases of larynx: Secondary | ICD-10-CM | POA: Diagnosis not present

## 2015-01-18 DIAGNOSIS — R499 Unspecified voice and resonance disorder: Secondary | ICD-10-CM | POA: Diagnosis not present

## 2015-01-18 DIAGNOSIS — N393 Stress incontinence (female) (male): Secondary | ICD-10-CM | POA: Diagnosis not present

## 2015-01-18 DIAGNOSIS — Z01419 Encounter for gynecological examination (general) (routine) without abnormal findings: Secondary | ICD-10-CM | POA: Insufficient documentation

## 2015-01-18 LAB — URINALYSIS W MICROSCOPIC + REFLEX CULTURE
Bilirubin Urine: NEGATIVE
Casts: NONE SEEN [LPF]
Crystals: NONE SEEN [HPF]
Glucose, UA: NEGATIVE
Hgb urine dipstick: NEGATIVE
Ketones, ur: NEGATIVE
Leukocytes, UA: NEGATIVE
Nitrite: NEGATIVE
Protein, ur: NEGATIVE
RBC / HPF: NONE SEEN RBC/HPF (ref <=2)
Specific Gravity, Urine: >1.03 (ref 1.001–1.035)
Yeast: NONE SEEN [HPF]
pH: 5 (ref 5.0–8.0)

## 2015-01-18 LAB — WET PREP FOR TRICH, YEAST, CLUE
Trich, Wet Prep: NONE SEEN
Yeast Wet Prep HPF POC: NONE SEEN

## 2015-01-18 LAB — HM PAP SMEAR

## 2015-01-18 MED ORDER — METRONIDAZOLE 500 MG PO TABS: 500.0000 mg | ORAL_TABLET | Freq: Two times a day (BID) | ORAL | Status: DC

## 2015-01-18 MED ORDER — NITROFURANTOIN MONOHYD MACRO 100 MG PO CAPS: 100.0000 mg | ORAL_CAPSULE | Freq: Two times a day (BID) | ORAL | Status: DC

## 2015-01-18 NOTE — Progress Notes (Signed)
Tasha [name] 11-06-1960 [id number]   History:    54 y.o.  for annual gyn exam who is a new patient to the practice. Her PCP Dr. [name] had referred to our practice since she has not had a gynecological exam in quite some time. We do not have her formal records from her gynecologist that she was seen in the past who has since deceased. She reports having had a hysterectomy in the past and she believes all her female organs had been removed. She is a chronic smoker and is complaining of stress urinary incontinence with coughing, bending down or any lifting. She also gets up at night several times to urinate. Sometimes when she urinates she states that she doesn't feel like she M these completely. She is also been complaining of several months of a brownish-like discharge. She states she has not had any change in sexual partners. She states she's always had normal Pap smears in the past. She reports having had a normal colonoscopy in 2010. Her hysterectomy was reported to have been done as a result of endometriosis.  Past medical history,surgical history, family history and social history were all reviewed and documented in the EPIC chart.  Gynecologic History No LMP recorded. Patient has had a hysterectomy. Contraception: status post hysterectomy Last Pap: Several years ago. Results were: normal Last mammogram: 2015. Results were: normal  Obstetric History OB History  Gravida Para Term Preterm AB SAB TAB Ectopic Multiple Living  5 2   3 2  1  2     # Outcome Date GA Lbr Len/2nd Weight Sex Delivery Anes PTL Lv  5 Ectopic           4 SAB           3 SAB           2 Para           1 Para                ROS: A ROS was performed and pertinent positives and negatives are included in the history.  GENERAL: No fevers or chills. HEENT: No change in vision, no earache, sore throat or sinus congestion. NECK: No pain or stiffness. CARDIOVASCULAR: No chest pain or pressure. No palpitations.  PULMONARY: No shortness of breath, cough or wheeze. GASTROINTESTINAL: No abdominal pain, nausea, vomiting or diarrhea, melena or bright red blood per rectum. GENITOURINARY: No urinary frequency, urgency, hesitancy or dysuria. MUSCULOSKELETAL: No joint or muscle pain, no back pain, no recent trauma. DERMATOLOGIC: No rash, no itching, no lesions. ENDOCRINE: No polyuria, polydipsia, no heat or cold intolerance. No recent change in weight. HEMATOLOGICAL: No anemia or easy bruising or bleeding. NEUROLOGIC: No headache, seizures, numbness, tingling or weakness. PSYCHIATRIC: No depression, no loss of interest in normal activity or change in sleep pattern.     Exam: chaperone present  BP 130/78 mmHg  Ht 5\' 7"  (1.702 m)  Wt 170 lb (77.111 kg)  BMI 26.62 kg/m2  Body mass index is 26.62 kg/(m^2).  General appearance : Well developed well nourished female. No acute distress HEENT: Eyes: no retinal hemorrhage or exudates,  Neck supple, trachea midline, no carotid bruits, no thyroidmegaly Lungs: Clear to auscultation, no rhonchi or wheezes, or rib retractions  Heart: Regular rate and rhythm, no murmurs or gallops Breast:Examined in sitting and supine position were symmetrical in appearance, no palpable masses or tenderness,  no skin retraction, no nipple inversion, no nipple discharge, no skin discoloration,  no axillary or supraclavicular lymphadenopathy Abdomen: no palpable masses or tenderness, no rebound or guarding Extremities: no edema or skin discoloration or tenderness  Pelvic:  Bartholin, Urethra, Skene Glands: Within normal limits             Vagina: No gross lesions or discharge, first-degree cystocele  Cervix: Cervix present  Uterus  absent  Adnexa  Without masses or tenderness  Anus and perineum  normal   Rectovaginal  normal sphincter tone without palpated masses or tenderness             Hemoccult PCP provides  Patient was examined in the sitting, indirect, and supine position. When she  was asked to bear down she did not leak any urine. A first-degree cystocele was noted. Patient had good anal wink. Urethro vesical angle test was performed by placing a sterile applicator stick impregnated with lidocaine near the urethrovesical angle and patient was instructed to bear down. There was less than a 30 change indicating urethral hypermobility.  Urinalysis: Many bacteria, no yeast, no red blood cells seen, white blood cells 0-5 urine culture pending  Wet prep few clue cells were entered of EDC 2 numerous to count bacteria  GC and Chlamydia culture pending results pending Marcello Moores dictation   Assessment/Plan:  54 y.o. female for annual exam who is a chronic smoker with urinary incontinence. Patient did not appear to have hypermobility of her urethral vesical angle best tone testing in the office today. We are going to set up a urodynamic evaluation to rule out detrusor dyssynergia. We discussed the detrimental effects of smoking as part of her counseling. For her bacterial vaginosis she'll be treated with Flagyl 500 mg twice a day for 7 days. A GC and Chlamydia culture was obtained. It appears from the exam the patient does have her cervix and with her history of what she reports as having had endometriosis she probably had a supracervical hysterectomy with up with her without removal her tubes and ovary. We are going to try to obtain her records for clarification. We'll see her back in the office after the urodynamic evaluation. She is also overdue to schedule her mammogram. We'll also go to schedule a baseline bone density study here in the office the next few months as well.   Terrance Mass MD, 11:02 AM 01/18/2015

## 2015-01-18 NOTE — Patient Instructions (Addendum)
Bacterial Vaginosis Bacterial vaginosis is a vaginal infection that occurs when the normal balance of bacteria in the vagina is disrupted. It results from an overgrowth of certain bacteria. This is the most common vaginal infection in women of childbearing age. Treatment is important to prevent complications, especially in pregnant women, as it can cause a premature delivery. CAUSES  Bacterial vaginosis is caused by an increase in harmful bacteria that are normally present in smaller amounts in the vagina. Several different kinds of bacteria can cause bacterial vaginosis. However, the reason that the condition develops is not fully understood. RISK FACTORS Certain activities or behaviors can put you at an increased risk of developing bacterial vaginosis, including:  Having a new sex partner or multiple sex partners.  Douching.  Using an intrauterine device (IUD) for contraception. Women do not get bacterial vaginosis from toilet seats, bedding, swimming pools, or contact with objects around them. SIGNS AND SYMPTOMS  Some women with bacterial vaginosis have no signs or symptoms. Common symptoms include:  Grey vaginal discharge.  A fishlike odor with discharge, especially after sexual intercourse.  Itching or burning of the vagina and vulva.  Burning or pain with urination. DIAGNOSIS  Your health care provider will take a medical history and examine the vagina for signs of bacterial vaginosis. A sample of vaginal fluid may be taken. Your health care provider will look at this sample under a microscope to check for bacteria and abnormal cells. A vaginal pH test may also be done.  TREATMENT  Bacterial vaginosis may be treated with antibiotic medicines. These may be given in the form of a pill or a vaginal cream. A second round of antibiotics may be prescribed if the condition comes back after treatment. Because bacterial vaginosis increases your risk for sexually transmitted diseases, getting  treated can help reduce your risk for chlamydia, gonorrhea, HIV, and herpes. HOME CARE INSTRUCTIONS   Only take over-the-counter or prescription medicines as directed by your health care provider.  If antibiotic medicine was prescribed, take it as directed. Make sure you finish it even if you start to feel better.  Tell all sexual partners that you have a vaginal infection. They should see their health care provider and be treated if they have problems, such as a mild rash or itching.  During treatment, it is important that you follow these instructions:  Avoid sexual activity or use condoms correctly.  Do not douche.  Avoid alcohol as directed by your health care provider.  Avoid breastfeeding as directed by your health care provider. SEEK MEDICAL CARE IF:   Your symptoms are not improving after 3 days of treatment.  You have increased discharge or pain.  You have a fever. MAKE SURE YOU:   Understand these instructions.  Will watch your condition.  Will get help right away if you are not doing well or get worse. FOR MORE INFORMATION  Centers for Disease Control and Prevention, Division of STD Prevention: [employer]raiser[employer]raud[employer] American Sexual Health Association (ASHA): www.ashastd.org    This information is not intended to replace advice given to you by your health care provider. Make sure you discuss any questions you have with your health care provider.   Document Released: 01/23/2005 Document Revised: 02/13/2014 Document Reviewed: 09/04/2012 Elsevier Interactive Patient Education 2016 Elsevier Inc. Urodynamic Testing WHAT IS URODYNAMIC TESTING? Urodynamic tests are done to determine how well your lower urinary tract is working. The lower urinary tract includes your bladder and the tube that empties your bladder (urethra).  When  your kidneys filter your blood, urine is stored in your bladder until you feel the urge to pass urine (urinate). Urination requires coordination  between the nerves and muscles of your bladder and urethra. When your lower urinary tract is working well, you should be able to:   Start urinating when your bladder is full.  Empty your bladder completely.  Control the flow of your urine. WHY DO I NEED URODYNAMIC TESTING? You may need urodynamic testing if you: Are leaking urine (incontinence).Metronidazole tablets or capsules What is this medicine? METRONIDAZOLE (me troe NI da zole) is an antiinfective. It is used to treat certain kinds of bacterial and protozoal infections. It will not work for colds, flu, or other viral infections. This medicine may be used for other purposes; ask your health care provider or pharmacist if you have questions. What should I tell my health care provider before I take this medicine? They need to know if you have any of these conditions: -anemia or other blood disorders -disease of the nervous system -fungal or yeast infection -if you drink alcohol containing drinks -liver disease -seizures -an unusual or allergic reaction to metronidazole, or other medicines, foods, dyes, or preservatives -pregnant or trying to get pregnant -breast-feeding How should I use this medicine? Take this medicine by mouth with a full glass of water. Follow the directions on the prescription label. Take your medicine at regular intervals. Do not take your medicine more often than directed. Take all of your medicine as directed even if you think you are better. Do not skip doses or stop your medicine early. Talk to your pediatrician regarding the use of this medicine in children. Special care may be needed. Overdosage: If you think you have taken too much of this medicine contact a poison control center or emergency room at once. NOTE: This medicine is only for you. Do not share this medicine with others. What if I miss a dose? If you miss a dose, take it as soon as you can. If it is almost time for your next dose, take only  that dose. Do not take double or extra doses. What may interact with this medicine? Do not take this medicine with any of the following medications: -alcohol or any product that contains alcohol -amprenavir oral solution -cisapride -disulfiram -dofetilide -dronedarone -paclitaxel injection -pimozide -ritonavir oral solution -sertraline oral solution -sulfamethoxazole-trimethoprim injection -thioridazine -ziprasidone This medicine may also interact with the following medications: -birth control pills -cimetidine -lithium -other medicines that prolong the QT interval (cause an abnormal heart rhythm) -phenobarbital -phenytoin -warfarin This list may not describe all possible interactions. Give your health care provider a list of all the medicines, herbs, non-prescription drugs, or dietary supplements you use. Also tell them if you smoke, drink alcohol, or use illegal drugs. Some items may interact with your medicine. What should I watch for while using this medicine? Tell your doctor or health care professional if your symptoms do not improve or if they get worse. You may get drowsy or dizzy. Do not drive, use machinery, or do anything that needs mental alertness until you know how this medicine affects you. Do not stand or sit up quickly, especially if you are an older patient. This reduces the risk of dizzy or fainting spells. Avoid alcoholic drinks while you are taking this medicine and for three days afterward. Alcohol may make you feel dizzy, sick, or flushed. If you are being treated for a sexually transmitted disease, avoid sexual contact until you have finished  your treatment. Your sexual partner may also need treatment. What side effects may I notice from receiving this medicine? Side effects that you should report to your doctor or health care professional as soon as possible: -allergic reactions like skin rash or hives, swelling of the face, lips, or tongue -confusion,  clumsiness -difficulty speaking -discolored or sore mouth -dizziness -fever, infection -numbness, tingling, pain or weakness in the hands or feet -trouble passing urine or change in the amount of urine -redness, blistering, peeling or loosening of the skin, including inside the mouth -seizures -unusually weak or tired -vaginal irritation, dryness, or discharge Side effects that usually do not require medical attention (report to your doctor or health care professional if they continue or are bothersome): -diarrhea -headache -irritability -metallic taste -nausea -stomach pain or cramps -trouble sleeping This list may not describe all possible side effects. Call your doctor for medical advice about side effects. You may report side effects to FDA at 1-800-FDA-1088. Where should I keep my medicine? Keep out of the reach of children. Store at room temperature below 25 degrees C (77 degrees F). Protect from light. Keep container tightly closed. Throw away any unused medicine after the expiration date. NOTE: This sheet is a summary. It may not cover all possible information. If you have questions about this medicine, talk to your doctor, pharmacist, or health care provider.    2016, Elsevier/Gold Standard. (2012-08-30 14:08:39)  Have problems starting or stopping your urine flow.  Have frequent or painful urination.  Have frequent urinary tract infections.  Cannot empty your bladder completely.  Have strong urges to pass urine (urgency).  Have a weak flow of urine. HOW IS URODYNAMIC TESTING DONE? Urodynamic tests may be done separately or all during one testing visit. These tests may be done at your health care provider's office, a clinic, or a hospital. You may be given an antibiotic medicine before or after testing to prevent infection. Ask your health care provider if you should:  Stop taking any of your regular medicines.  Arrive for the test with a full bladder. The  urodynamic tests you may have done include: Uroflowmetry This test measures how much urine you pass and how long it takes to pass.  You sit on a special toilet to urinate.  The toilet measures the volume and the time of your urine flow.  These measurements are sent to a computer that creates a graph of your urine flow. Postvoid residual measurement This test measures how much urine is left in your bladder after you urinate.  It uses sound waves (ultrasound) to create an image of your bladder.  The test can also be done by inserting a thin, flexible tube (catheter) into your bladder after you urinate.  Remaining urine is measured in milliliters (mL). If you have more than 100 mL left in your bladder after you urinate, your bladder is not emptying as it should. Cystometric testing This test uses a special bladder catheter that can measure pressure.   A numbing medicine (local anesthetic) may be used.  First, a normal catheter is used to empty your bladder completely.  Then the measuring catheter is placed, and your bladder is filled with warm, germ-free (sterile) water.  Pressure measurements will be taken:  As your bladder fills.  When you feel the need to urinate.  As your bladder is emptied.  You may be asked to cough or bear down to check for leakage.  In some cases, your bladder may be filled  with a material that shows up on X-rays (contrast material) so that X-ray pictures can be taken during the test. Electromyography This test measures the electrical activity of the nerves and muscles of your bladder and the opening of your urethra.   It tells how well your nerves are communicating with your muscles.  Sticky patches are placed near your rectum and urethra to measure electrical activity. WHAT ARE THE RISKS OF THIS TESTING? Generally, these tests are safe. However, problems can occur and include:  Discomfort.  Frequent urge to  urinate.  Bleeding.  Infection.  An allergic reaction to contrast material, if contrast material is used. WHAT HAPPENS AFTER THE TESTING?   You should be able to go home right away and do your usual activities.  You may be instructed to drink a tall glass of water every 30 minutes for the first 2 hours you are home.  Taking a warm bath or using a warm compress may relieve any discomfort near your urethra. Let your health care provider know if you have:  Pain.  Blood in your urine.  Chills.  Fever. WHAT DO MY TEST RESULTS MEAN? Discuss the results of your urodynamic tests with your health care provider. Your health care provider will use the results of these and other tests, along with your signs and symptoms, to make a diagnosis. Some common causes for abnormal results from urodynamic tests include:  Enlarged prostate in men.  Overactive bladder.  Urinary tract infection.  Nervous system diseases.  Spinal cord damage.   This information is not intended to replace advice given to you by your health care provider. Make sure you discuss any questions you have with your health care provider.   Document Released: 11/20/2006 Document Revised: 02/13/2014 Document Reviewed: 05/05/2013 Elsevier Interactive Patient Education [employer].

## 2015-01-19 ENCOUNTER — Ambulatory Visit: Payer: Medicare Other | Admitting: Neurology

## 2015-01-19 ENCOUNTER — Other Ambulatory Visit: Payer: Self-pay | Admitting: Gynecology

## 2015-01-19 DIAGNOSIS — Z13820 Encounter for screening for osteoporosis: Secondary | ICD-10-CM

## 2015-01-19 LAB — GC/CHLAMYDIA PROBE AMP
CT Probe RNA: NOT DETECTED
GC Probe RNA: NOT DETECTED

## 2015-01-19 LAB — CYTOLOGY - PAP

## 2015-01-20 ENCOUNTER — Telehealth: Payer: Self-pay | Admitting: *Deleted

## 2015-01-20 LAB — URINE CULTURE
Colony Count: NO GROWTH
Organism ID, Bacteria: NO GROWTH

## 2015-01-20 NOTE — Telephone Encounter (Signed)
I called pts insurance and looked online for benefits. I cannot get through the automated system due to DOB not matching in the system to the patient. With the out of state provider # I couldn't get to a live person. I checked online and again it stated DOB didn't match patient. I left pt a message. Am going to quote her the charges and allowable prices for urodynamic procedures and have her sign a waiver or if she knows something about her benefits then we can discuss. Im awaiting patient to return my call.  KW CMA

## 2015-01-21 ENCOUNTER — Telehealth: Payer: Self-pay

## 2015-01-21 NOTE — Telephone Encounter (Signed)
Patient called to check on test results. Informed. GC/chl cultures and urine cultures negative. Patient informed.

## 2015-01-22 NOTE — Telephone Encounter (Signed)
I spoke with patient and informed her of not being able to receive benefits and she stated she has met deductible and OPM and wanted to proceed with test. Instructions were given to come to office with full bladder at [place] 12/20. She understood. She will have her Bone density 12/22. KW CMA

## 2015-01-26 ENCOUNTER — Ambulatory Visit (INDEPENDENT_AMBULATORY_CARE_PROVIDER_SITE_OTHER): Payer: BLUE CROSS/BLUE SHIELD | Admitting: *Deleted

## 2015-01-26 DIAGNOSIS — N393 Stress incontinence (female) (male): Secondary | ICD-10-CM

## 2015-01-26 NOTE — Progress Notes (Signed)
Urodynamics was performed by Caryn Bee RMA and assisted by Burnett Kanaris RMA without difficulty.

## 2015-01-27 ENCOUNTER — Telehealth: Payer: Self-pay | Admitting: *Deleted

## 2015-01-27 DIAGNOSIS — M170 Bilateral primary osteoarthritis of knee: Secondary | ICD-10-CM

## 2015-01-27 DIAGNOSIS — IMO0001 Reserved for inherently not codable concepts without codable children: Secondary | ICD-10-CM

## 2015-01-27 MED ORDER — HYDROCODONE-ACETAMINOPHEN 5-325 MG PO TABS: 1.0000 | ORAL_TABLET | Freq: Four times a day (QID) | ORAL | Status: DC | PRN

## 2015-01-27 NOTE — Telephone Encounter (Signed)
done

## 2015-01-27 NOTE — Telephone Encounter (Signed)
Notified pt rx ready for pick-up.../lmb 

## 2015-01-27 NOTE — Telephone Encounter (Signed)
Received call from patient stating that Dr. Ronnald Ramp did not give her a prescription for her hydrocodone. She gave all her papers to the pharmacist when she came to the appt. She stated that she went back to pharmacy to see if rx was given. The pharmacist stated they didn't anything on file for her. He has check the [facility] registry & script has not been fill. Pt was very rude, yelling on the phone stating she needs her pills. I inform pt again the decision will come for Dr. Bolivar Haw

## 2015-01-29 ENCOUNTER — Telehealth: Payer: Self-pay | Admitting: *Deleted

## 2015-01-29 DIAGNOSIS — R05 Cough: Secondary | ICD-10-CM

## 2015-01-29 DIAGNOSIS — R059 Cough, unspecified: Secondary | ICD-10-CM

## 2015-01-29 DIAGNOSIS — J209 Acute bronchitis, unspecified: Secondary | ICD-10-CM

## 2015-01-29 MED ORDER — PROMETHAZINE-DM 6.25-15 MG/5ML PO SYRP: 5.0000 mL | ORAL_SOLUTION | Freq: Four times a day (QID) | ORAL | Status: DC | PRN

## 2015-01-29 NOTE — Telephone Encounter (Signed)
rx sent

## 2015-01-29 NOTE — Telephone Encounter (Signed)
Left msg on triage stating both her and her husband has a head cold, nasal drainage, cough but not able to cough anything up. Requesting md to rx something for sxs...Johny Chess

## 2015-01-29 NOTE — Telephone Encounter (Signed)
Notified pt md sent some cough syrup to [facility].../lmb

## 2015-02-11 ENCOUNTER — Ambulatory Visit: Payer: BLUE CROSS/BLUE SHIELD | Admitting: Gynecology

## 2015-02-15 ENCOUNTER — Encounter: Payer: Self-pay | Admitting: Gynecology

## 2015-02-21 ENCOUNTER — Other Ambulatory Visit: Payer: Self-pay | Admitting: Internal Medicine

## 2015-02-22 NOTE — Telephone Encounter (Signed)
faxed

## 2015-02-24 ENCOUNTER — Ambulatory Visit (INDEPENDENT_AMBULATORY_CARE_PROVIDER_SITE_OTHER): Payer: Managed Care, Other (non HMO) | Admitting: Gynecology

## 2015-02-24 ENCOUNTER — Encounter: Payer: Self-pay | Admitting: Gynecology

## 2015-02-24 VITALS — BP 134/82

## 2015-02-24 DIAGNOSIS — N39490 Overflow incontinence: Secondary | ICD-10-CM | POA: Insufficient documentation

## 2015-02-24 DIAGNOSIS — N319 Neuromuscular dysfunction of bladder, unspecified: Secondary | ICD-10-CM

## 2015-02-24 NOTE — Patient Instructions (Signed)
Neurogenic Bladder Neurogenic bladder is a bladder control disorder. It is caused by problems with the nerves that control your bladder. This condition may make your bladder overactive or underactive. You may have trouble holding your urine or passing your urine (urinating). The bladder is a hollow organ in the lower part of your abdomen. It stores urine after the urine is made by your kidneys. Normally, when your bladder is not full, the muscles that control your bladder are relaxed. When your bladder fills with urine, nerve signals are sent to your brain, indicating that your bladder is full. Your brain then sends signals through your spinal cord to the muscles in your bladder that start and stop urine flow. If you have neurogenic bladder, the nerves and muscles do not work together the way they should. CAUSES  Any kind of nerve damage or condition that disrupts the signals from your brain to your bladder can cause neurogenic bladder. Many things can cause these nerve problems. They include:  A disease that affects the nervous system, such as:  Alzheimer disease.  Cerebral palsy.  Multiple sclerosis.  Diabetes.  Parkinson disease.  Damage to your brain or spinal cord from:  Trauma.  Tumor.  Infection.  Surgery.  Alcohol abuse.  Stroke.  A spinal cord birth defect. RISK FACTORS Having nerve damage or a nerve disorder puts you at risk for neurogenic bladder. SIGNS AND SYMPTOMS  Signs and symptoms of neurogenic bladder include:  Leaking or gushing urine (incontinence).  A sudden, strong urge to pass urine (urgency).  Frequent urination during the day and night.  Being unable to empty your bladder completely (urinary retention).  Frequent urinary tract infections. DIAGNOSIS  Your health care provider may diagnose neurogenic bladder based on your symptoms and medical history. A physical exam will also be done. You may be asked to keep a record of your bladder symptoms  and the times that you urinate (bladder diary). Your health care provider may also do several tests to help diagnose neurogenic bladder, including:  A urine test to check for infection.  A bladder scan after you urinate to see how much urine is left in your bladder.  Various tests to measure your urine flow and see how well the flow is controlled (urodynamic tests).  A procedure that involves using a tool that is like a very thin telescope to look through your urethra into your bladder (cystoscopy). A health care provider who specializes in the urinary tract (urologist) may do this test.  Imaging tests of your brain or spine. TREATMENT  Treatment depends on the cause of your neurogenic bladder and the symptoms you are having. Work closely with your health care provider to find the treatments that will improve your quality of life. Treatment options include:  Learning ways to control when you urinate, such as:  Urinating at scheduled times.  Training yourself to delay urination.  Doing exercises (Kegel exercises) to strengthen the muscles that control urine flow.  Avoiding foods or drinks that make your symptoms worse.  Taking medicines to:  Stimulate an underactive bladder.  Relax an overactive bladder.  Treat a urinary tract infection.  Learning how to use a thin tube (catheter) to empty your bladder. A catheter is a hollow tube that you pass through your urethra.  Procedures to stimulate the nerves that control your bladder.  Surgical procedures if other treatments do not help. HOME CARE INSTRUCTIONS   Keep a bladder diary to find out which foods, liquids, or activities  make your symptoms worse.  Use your bladder diary to schedule bathroom trips. If you are away from home, plan to be near a bathroom when your schedule says you need one.  Do Kegel exercises to strengthen the muscles that control urination. These muscles are the ones you use to try to hold urine when you  need to go. To do Kegel exercises:  Squeeze these muscles tight and hold for about 10 seconds.  Repeat three times.  Do these exercises often during the day when you do not have to urinate.  Limit your drinking of beverages that stimulate urination. These include soda, coffee, and tea.  After urinating, wait a few minutes and try again (double voiding).  Make sure you urinate just before you leave the house and just before you go to bed.  Take medicines only as directed by your health care provider.  Keep all follow-up visits as directed by your health care provider. This is important. SEEK MEDICAL CARE IF:   You are having a hard time controlling your symptoms.  Your symptoms are getting worse.  You have signs of a urinary tract infection:  A burning feeling when you urinate.  Chills.  Fever. SEEK IMMEDIATE MEDICAL CARE IF:  You cannot pass urine.    This information is not intended to replace advice given to you by your health care provider. Make sure you discuss any questions you have with your health care provider.   Document Released: 08/06/2006 Document Revised: 02/13/2014 Document Reviewed: 05/06/2013 Elsevier Interactive Patient Education [employer].

## 2015-02-24 NOTE — Progress Notes (Signed)
   Patient is a 55 year old was seen as a new patient to the practice on December 2016. Patient had not had a gynecological examination quite some time. It appears from her history and findings during the exam that as a result of her endometriosis several years ago she had a supracervical hysterectomy because the cervix was still present at time of her exam. Patient is a chronic smoker 45 cigarettes per day and had been complaining of urinary incontinence with coughing, bending over or doing any lifting. This is especially when her bladder is full. On further history taking she has informed me that she will go 8-10 hours without urinating and has no sensation except when she starts to leak urine she goes to the bathroom to urinate. She did state that several years ago she was evaluated by a neurologist and there was the possibility of her having some form of neurological disorder but she has memory loss and is.recall and had been on medication. She wasn't sure if there was early signs of MS.  Patient had a urodynamic evaluation here in the office. The findings of her pelvic examination demonstrated the following: Pelvic: Bartholin, Urethra, Skene Glands: Within normal limits  Vagina: No gross lesions or discharge, first-degree cystocele Cervix: Cervix present Uterus absent Adnexa Without masses or tenderness Anus and perineum normal  Rectovaginal normal sphincter tone without palpated masses or tenderness  Hemoccult PCP provides  Patient was examined in the sitting, indirect, and supine position. When she was asked to bear down she did not leak any urine. A first-degree cystocele was noted. Patient had good anal wink. Urethro vesical angle test was performed by placing a sterile applicator stick impregnated with lidocaine near the urethrovesical angle and patient was instructed to bear down. There was  less than a 30 change indicating urethral hypermobility.  Urinalysis: Many bacteria, no yeast, no red blood cells seen, white blood cells 0-5 urine culture was negative as well as her GC and Chlamydia culture.  Her urodynamic testing demonstrated the following: The study was performed on 01/26/2015. Complex uroflowmetry was normal and demonstrated no evidence of obstructive uropathy. Her post void residual was normal with 0 cc. Patient's first sensation was at only 30 cc which indicated her bladder wasn't full to start of the procedure. Evidence of uninhibited detrusor activity was noted during multichannel CMG. Bowel sounds are leak point pressures were equivocal (60-100 cm of water). The patient's cystometric bladder capacity exceeded normal values (greater than 550 cc). The patient's vesical pressure demonstrated normal compliance from start of filling to the end of infusion during multichannel CMG. Sensory is in question due to shorts and between the first sensation at 5 cc and the second sensation 11 cc and a third sensation at 707 cc. Maximum urethral closure pressure (greater than 30 cm water) were within a normal range and failed to demonstrate intrinsic sphincter deficiency. 3 channel CMG with flow was within normal limits given the testing situation. (Peak flow 20-30 cc/s with a PVR of 25% or less of voided volume).  Assessment/plan: It appears patient has overflow incontinence with some underlying neurological problem that may be contributing to a neurogenic bladder. Patient is going to be referred to the urologist for further testing and evaluation and possible bladder training physical therapy.  Greater than 50% of the time was spent in counseling correlating care for this patient with overflow incontinence wobbly due to a neurogenic bladder.

## 2015-02-25 ENCOUNTER — Telehealth: Payer: Self-pay | Admitting: *Deleted

## 2015-02-25 NOTE — Telephone Encounter (Signed)
Appointment 04/05/15 @ 1:15pm with Dr.MacDiarmid pt aware

## 2015-02-25 NOTE — Telephone Encounter (Signed)
-----   Message from Terrance Mass, MD sent at 02/24/2015  5:08 PM EST ----- Anderson Malta, please make an appointment for this patient with Dr. Gaynelle Arabian or Dr. McDiarimid or Dr. Risa Grill or Dr. Karsten Ro for this patient with overflow incontinence. Please send him a copy of my office note as well as the urodynamic testing that we did here in the office.

## 2015-02-25 NOTE — Telephone Encounter (Signed)
Notes faxed to [facility] they will fax me back with time and date to relay to pt.

## 2015-03-17 ENCOUNTER — Other Ambulatory Visit: Payer: Self-pay | Admitting: *Deleted

## 2015-03-17 DIAGNOSIS — Z13820 Encounter for screening for osteoporosis: Secondary | ICD-10-CM

## 2015-03-17 DIAGNOSIS — Z780 Asymptomatic menopausal state: Secondary | ICD-10-CM

## 2015-03-26 ENCOUNTER — Emergency Department (HOSPITAL_COMMUNITY)
Admission: EM | Admit: 2015-03-26 | Discharge: 2015-03-26 | Disposition: A | Discharge status: Home / Self Care | Payer: Managed Care, Other (non HMO) | Attending: Emergency Medicine | Admitting: Emergency Medicine

## 2015-03-26 ENCOUNTER — Emergency Department (HOSPITAL_COMMUNITY): Payer: Managed Care, Other (non HMO)

## 2015-03-26 ENCOUNTER — Encounter (HOSPITAL_COMMUNITY): Payer: Self-pay | Admitting: *Deleted

## 2015-03-26 DIAGNOSIS — R0602 Shortness of breath: Secondary | ICD-10-CM | POA: Diagnosis not present

## 2015-03-26 DIAGNOSIS — Z79899 Other long term (current) drug therapy: Secondary | ICD-10-CM | POA: Diagnosis not present

## 2015-03-26 DIAGNOSIS — F419 Anxiety disorder, unspecified: Secondary | ICD-10-CM | POA: Diagnosis not present

## 2015-03-26 DIAGNOSIS — Z8742 Personal history of other diseases of the female genital tract: Secondary | ICD-10-CM | POA: Insufficient documentation

## 2015-03-26 DIAGNOSIS — E663 Overweight: Secondary | ICD-10-CM | POA: Insufficient documentation

## 2015-03-26 DIAGNOSIS — F329 Major depressive disorder, single episode, unspecified: Secondary | ICD-10-CM | POA: Diagnosis not present

## 2015-03-26 DIAGNOSIS — F439 Reaction to severe stress, unspecified: Secondary | ICD-10-CM | POA: Insufficient documentation

## 2015-03-26 DIAGNOSIS — E1165 Type 2 diabetes mellitus with hyperglycemia: Secondary | ICD-10-CM | POA: Diagnosis not present

## 2015-03-26 DIAGNOSIS — Z8679 Personal history of other diseases of the circulatory system: Secondary | ICD-10-CM | POA: Insufficient documentation

## 2015-03-26 DIAGNOSIS — F17210 Nicotine dependence, cigarettes, uncomplicated: Secondary | ICD-10-CM | POA: Diagnosis not present

## 2015-03-26 DIAGNOSIS — Z8739 Personal history of other diseases of the musculoskeletal system and connective tissue: Secondary | ICD-10-CM | POA: Diagnosis not present

## 2015-03-26 DIAGNOSIS — R0789 Other chest pain: Secondary | ICD-10-CM | POA: Diagnosis not present

## 2015-03-26 DIAGNOSIS — E7800 Pure hypercholesterolemia, unspecified: Secondary | ICD-10-CM | POA: Diagnosis not present

## 2015-03-26 DIAGNOSIS — K219 Gastro-esophageal reflux disease without esophagitis: Secondary | ICD-10-CM | POA: Insufficient documentation

## 2015-03-26 DIAGNOSIS — Z8669 Personal history of other diseases of the nervous system and sense organs: Secondary | ICD-10-CM | POA: Insufficient documentation

## 2015-03-26 DIAGNOSIS — R739 Hyperglycemia, unspecified: Secondary | ICD-10-CM | POA: Insufficient documentation

## 2015-03-26 DIAGNOSIS — E039 Hypothyroidism, unspecified: Secondary | ICD-10-CM | POA: Diagnosis not present

## 2015-03-26 DIAGNOSIS — R079 Chest pain, unspecified: Secondary | ICD-10-CM | POA: Diagnosis present

## 2015-03-26 DIAGNOSIS — Z720 Tobacco use: Secondary | ICD-10-CM

## 2015-03-26 LAB — D-DIMER, QUANTITATIVE: D-Dimer, Quant: <0.27 ug/mL-FEU (ref 0.00–0.50)

## 2015-03-26 LAB — CBC
HCT: 41.1 % (ref 36.0–46.0)
Hemoglobin: 13.5 g/dL (ref 12.0–15.0)
MCH: 30.9 pg (ref 26.0–34.0)
MCHC: 32.8 g/dL (ref 30.0–36.0)
MCV: 94.1 fL (ref 78.0–100.0)
Platelets: 174 10*3/uL (ref 150–400)
RBC: 4.37 MIL/uL (ref 3.87–5.11)
RDW: 12.3 % (ref 11.5–15.5)
WBC: 9.7 10*3/uL (ref 4.0–10.5)

## 2015-03-26 LAB — BASIC METABOLIC PANEL
Anion gap: 12 (ref 5–15)
BUN: 6 mg/dL (ref 6–20)
CO2: 24 mmol/L (ref 22–32)
Calcium: 9.5 mg/dL (ref 8.9–10.3)
Chloride: 107 mmol/L (ref 101–111)
Creatinine, Ser: 0.83 mg/dL (ref 0.44–1.00)
GFR calc Af Amer: >60 mL/min (ref >60)
GFR calc non Af Amer: >60 mL/min (ref >60)
Glucose, Bld: 212 mg/dL — ABNORMAL HIGH (ref 65–99)
Potassium: 3.8 mmol/L (ref 3.5–5.1)
Sodium: 143 mmol/L (ref 135–145)

## 2015-03-26 LAB — I-STAT TROPONIN, ED: Troponin i, poc: 0 ng/mL (ref 0.00–0.08)

## 2015-03-26 LAB — TROPONIN I: Troponin I: <0.03 ng/mL (ref <0.031)

## 2015-03-26 MED ORDER — ASPIRIN 325 MG PO TABS
325.0000 mg | ORAL_TABLET | Freq: Once | ORAL | Status: AC
  Administered 2015-03-26: 325 mg via ORAL
  Filled 2015-03-26: qty 1

## 2015-03-26 MED ORDER — GI COCKTAIL ~~LOC~~
30.0000 mL | Freq: Once | ORAL | Status: AC
  Administered 2015-03-26: 30 mL via ORAL
  Filled 2015-03-26: qty 30

## 2015-03-26 NOTE — Discharge Instructions (Signed)
Your chest pain could be due to indigestion or from stress. Stay well hydrated, avoid spicy foods or acidic foods, use over the counter maalox or tums as needed for heartburn/indigestion. Stop smoking. Use tylenol as needed for pain. Use home prilosec or zantac for reflux/GERD. Follow up with your regular doctor in 3-5 days for recheck of ongoing symptoms, and to discuss your elevated blood sugars which could indicate that you've become diabetic and may need further treatment for this. Return to the ER for changes or worsening symptoms.   Nonspecific Chest Pain It is often hard to find the cause of chest pain. There is always a chance that your pain could be related to something serious, such as a heart attack or a blood clot in your lungs. Chest pain can also be caused by conditions that are not life-threatening. If you have chest pain, it is very important to follow up with your doctor.  HOME CARE  If you were prescribed an antibiotic medicine, finish it all even if you start to feel better.  Avoid any activities that cause chest pain.  Do not use any tobacco products, including cigarettes, chewing tobacco, or electronic cigarettes. If you need help quitting, ask your doctor.  Do not drink alcohol.  Take medicines only as told by your doctor.  Keep all follow-up visits as told by your doctor. This is important. This includes any further testing if your chest pain does not go away.  Your doctor may tell you to keep your head raised (elevated) while you sleep.  Make lifestyle changes as told by your doctor. These may include:  Getting regular exercise. Ask your doctor to suggest some activities that are safe for you.  Eating a heart-healthy diet. Your doctor or a diet specialist (dietitian) can help you to learn healthy eating options.  Maintaining a healthy weight.  Managing diabetes, if necessary.  Reducing stress. GET HELP IF:  Your chest pain does not go away, even after  treatment.  You have a rash with blisters on your chest.  You have a fever. GET HELP RIGHT AWAY IF:  Your chest pain is worse.  You have an increasing cough, or you cough up blood.  You have severe belly (abdominal) pain.  You feel extremely weak.  You pass out (faint).  You have chills.  You have sudden, unexplained chest discomfort.  You have sudden, unexplained discomfort in your arms, back, neck, or jaw.  You have shortness of breath at any time.  You suddenly start to sweat, or your skin gets clammy.  You feel nauseous.  You vomit.  You suddenly feel light-headed or dizzy.  Your heart begins to beat quickly, or it feels like it is skipping beats. These symptoms may be an emergency. Do not wait to see if the symptoms will go away. Get medical help right away. Call your local emergency services (911 in the [place] Do not drive yourself to the hospital.   This information is not intended to replace advice given to you by your health care provider. Make sure you discuss any questions you have with your health care provider.   Document Released: 07/12/2007 Document Revised: 02/13/2014 Document Reviewed: 08/29/2013 Elsevier Interactive Patient Education 2016 [employer]  Shortness of Breath Shortness of breath means you have trouble breathing. Shortness of breath needs medical care right away. HOME CARE   Do not smoke.  Avoid being around chemicals or things (paint fumes, dust) that may bother your breathing.  Rest as  needed. Slowly begin your normal activities.  Only take medicines as told by your doctor.  Keep all doctor visits as told. GET HELP RIGHT AWAY IF:   Your shortness of breath gets worse.  You feel lightheaded, pass out (faint), or have a cough that is not helped by medicine.  You cough up blood.  You have pain with breathing.  You have pain in your chest, arms, shoulders, or belly (abdomen).  You have a fever.  You cannot walk up  stairs or exercise the way you normally do.  You do not get better in the time expected.  You have a hard time doing normal activities even with rest.  You have problems with your medicines.  You have any new symptoms. MAKE SURE YOU:  Understand these instructions.  Will watch your condition.  Will get help right away if you are not doing well or get worse.   This information is not intended to replace advice given to you by your health care provider. Make sure you discuss any questions you have with your health care provider.   Document Released: 07/12/2007 Document Revised: 01/28/2013 Document Reviewed: 04/10/2011 Elsevier Interactive Patient Education 2016 [employer].  Smoking Cessation, Tips for Success If you are ready to quit smoking, congratulations! You have chosen to help yourself be healthier. Cigarettes bring nicotine, tar, carbon monoxide, and other irritants into your body. Your lungs, heart, and blood vessels will be able to work better without these poisons. There are many different ways to quit smoking. Nicotine gum, nicotine patches, a nicotine inhaler, or nicotine nasal spray can help with physical craving. Hypnosis, support groups, and medicines help break the habit of smoking. WHAT THINGS CAN I DO TO MAKE QUITTING EASIER?  Here are some tips to help you quit for good:  Pick a date when you will quit smoking completely. Tell all of your friends and family about your plan to quit on that date.  Do not try to slowly cut down on the number of cigarettes you are smoking. Pick a quit date and quit smoking completely starting on that day.  Throw away all cigarettes.   Clean and remove all ashtrays from your home, work, and car.  On a card, write down your reasons for quitting. Carry the card with you and read it when you get the urge to smoke.  Cleanse your body of nicotine. Drink enough water and fluids to keep your urine clear or pale yellow. Do this after  quitting to flush the nicotine from your body.  Learn to predict your moods. Do not let a bad situation be your excuse to have a cigarette. Some situations in your life might tempt you into wanting a cigarette.  Never have "just one" cigarette. It leads to wanting another and another. Remind yourself of your decision to quit.  Change habits associated with smoking. If you smoked while driving or when feeling stressed, try other activities to replace smoking. Stand up when drinking your coffee. Brush your teeth after eating. Sit in a different chair when you read the paper. Avoid alcohol while trying to quit, and try to drink fewer caffeinated beverages. Alcohol and caffeine may urge you to smoke.  Avoid foods and drinks that can trigger a desire to smoke, such as sugary or spicy foods and alcohol.  Ask people who smoke not to smoke around you.  Have something planned to do right after eating or having a cup of coffee. For example, plan to take  a walk or exercise.  Try a relaxation exercise to calm you down and decrease your stress. Remember, you may be tense and nervous for the first 2 weeks after you quit, but this will pass.  Find new activities to keep your hands busy. Play with a pen, coin, or rubber band. Doodle or draw things on paper.  Brush your teeth right after eating. This will help cut down on the craving for the taste of tobacco after meals. You can also try mouthwash.   Use oral substitutes in place of cigarettes. Try using lemon drops, carrots, cinnamon sticks, or chewing gum. Keep them handy so they are available when you have the urge to smoke.  When you have the urge to smoke, try deep breathing.  Designate your home as a nonsmoking area.  If you are a heavy smoker, ask your health care provider about a prescription for nicotine chewing gum. It can ease your withdrawal from nicotine.  Reward yourself. Set aside the cigarette money you save and buy yourself something  nice.  Look for support from others. Join a support group or smoking cessation program. Ask someone at home or at work to help you with your plan to quit smoking.  Always ask yourself, "Do I need this cigarette or is this just a reflex?" Tell yourself, "Today, I choose not to smoke," or "I do not want to smoke." You are reminding yourself of your decision to quit.  Do not replace cigarette smoking with electronic cigarettes (commonly called e-cigarettes). The safety of e-cigarettes is unknown, and some may contain harmful chemicals.  If you relapse, do not give up! Plan ahead and think about what you will do the next time you get the urge to smoke. HOW WILL I FEEL WHEN I QUIT SMOKING? You may have symptoms of withdrawal because your body is used to nicotine (the addictive substance in cigarettes). You may crave cigarettes, be irritable, feel very hungry, cough often, get headaches, or have difficulty concentrating. The withdrawal symptoms are only temporary. They are strongest when you first quit but will go away within 10-14 days. When withdrawal symptoms occur, stay in control. Think about your reasons for quitting. Remind yourself that these are signs that your body is healing and getting used to being without cigarettes. Remember that withdrawal symptoms are easier to treat than the major diseases that smoking can cause.  Even after the withdrawal is over, expect periodic urges to smoke. However, these cravings are generally short lived and will go away whether you smoke or not. Do not smoke! WHAT RESOURCES ARE AVAILABLE TO HELP ME QUIT SMOKING? Your health care provider can direct you to community resources or hospitals for support, which may include:  Group support.  Education.  Hypnosis.  Therapy.   This information is not intended to replace advice given to you by your health care provider. Make sure you discuss any questions you have with your health care provider.   Document  Released: 10/22/2003 Document Revised: 02/13/2014 Document Reviewed: 07/11/2012 Elsevier Interactive Patient Education 2016 [place] and Stress Management Stress is a normal reaction to life events. It is what you feel when life demands more than you are used to or more than you can handle. Some stress can be useful. For example, the stress reaction can help you catch the last bus of the day, study for a test, or meet a deadline at work. But stress that occurs too often or for too long can cause  problems. It can affect your emotional health and interfere with relationships and normal daily activities. Too much stress can weaken your immune system and increase your risk for physical illness. If you already have a medical problem, stress can make it worse. CAUSES  All sorts of life events may cause stress. An event that causes stress for one person may not be stressful for another person. Major life events commonly cause stress. These may be positive or negative. Examples include losing your job, moving into a new home, getting married, having a baby, or losing a loved one. Less obvious life events may also cause stress, especially if they occur day after day or in combination. Examples include working long hours, driving in traffic, caring for children, being in debt, or being in a difficult relationship. SIGNS AND SYMPTOMS Stress may cause emotional symptoms including, the following:  Anxiety. This is feeling worried, afraid, on edge, overwhelmed, or out of control.  Anger. This is feeling irritated or impatient.  Depression. This is feeling sad, down, helpless, or guilty.  Difficulty focusing, remembering, or making decisions. Stress may cause physical symptoms, including the following:   Aches and pains. These may affect your head, neck, back, stomach, or other areas of your body.  Tight muscles or clenched jaw.  Low energy or trouble sleeping. Stress may cause unhealthy  behaviors, including the following:   Eating to feel better (overeating) or skipping meals.  Sleeping too little, too much, or both.  Working too much or putting off tasks (procrastination).  Smoking, drinking alcohol, or using drugs to feel better. DIAGNOSIS  Stress is diagnosed through an assessment by your health care provider. Your health care provider will ask questions about your symptoms and any stressful life events.Your health care provider will also ask about your medical history and may order blood tests or other tests. Certain medical conditions and medicine can cause physical symptoms similar to stress. Mental illness can cause emotional symptoms and unhealthy behaviors similar to stress. Your health care provider may refer you to a mental health professional for further evaluation.  TREATMENT  Stress management is the recommended treatment for stress.The goals of stress management are reducing stressful life events and coping with stress in healthy ways.  Techniques for reducing stressful life events include the following:  Stress identification. Self-monitor for stress and identify what causes stress for you. These skills may help you to avoid some stressful events.  Time management. Set your priorities, keep a calendar of events, and learn to say "no." These tools can help you avoid making too many commitments. Techniques for coping with stress include the following:  Rethinking the problem. Try to think realistically about stressful events rather than ignoring them or overreacting. Try to find the positives in a stressful situation rather than focusing on the negatives.  Exercise. Physical exercise can release both physical and emotional tension. The key is to find a form of exercise you enjoy and do it regularly.  Relaxation techniques. These relax the body and mind. Examples include yoga, meditation, tai chi, biofeedback, deep breathing, progressive muscle relaxation,  listening to music, being out in nature, journaling, and other hobbies. Again, the key is to find one or more that you enjoy and can do regularly.  Healthy lifestyle. Eat a balanced diet, get plenty of sleep, and do not smoke. Avoid using alcohol or drugs to relax.  Strong support network. Spend time with family, friends, or other people you enjoy being around.Express your feelings and talk  things over with someone you trust. Counseling or talktherapy with a mental health professional may be helpful if you are having difficulty managing stress on your own. Medicine is typically not recommended for the treatment of stress.Talk to your health care provider if you think you need medicine for symptoms of stress. HOME CARE INSTRUCTIONS  Keep all follow-up visits as directed by your health care provider.  Take all medicines as directed by your health care provider. SEEK MEDICAL CARE IF:  Your symptoms get worse or you start having new symptoms.  You feel overwhelmed by your problems and can no longer manage them on your own. SEEK IMMEDIATE MEDICAL CARE IF:  You feel like hurting yourself or someone else.   This information is not intended to replace advice given to you by your health care provider. Make sure you discuss any questions you have with your health care provider.   Document Released: 07/19/2000 Document Revised: 02/13/2014 Document Reviewed: 09/17/2012 Elsevier Interactive Patient Education 2016 [employer].  Prediabetes Eating Plan Prediabetes--also called impaired glucose tolerance or impaired fasting glucose--is a condition that causes blood sugar (blood glucose) levels to be higher than normal. Following a healthy diet can help to keep prediabetes under control. It can also help to lower the risk of type 2 diabetes and heart disease, which are increased in people who have prediabetes. Along with regular exercise, a healthy diet:  Promotes weight loss.  Helps to control  blood sugar levels.  Helps to improve the way that the body uses insulin. WHAT DO I NEED TO KNOW ABOUT THIS EATING PLAN?  Use the glycemic index (GI) to plan your meals. The index tells you how quickly a food will raise your blood sugar. Choose low-GI foods. These foods take a longer time to raise blood sugar.  Pay close attention to the amount of carbohydrates in the food that you eat. Carbohydrates increase blood sugar levels.  Keep track of how many calories you take in. Eating the right amount of calories will help you to achieve a healthy weight. Losing about 7 percent of your starting weight can help to prevent type 2 diabetes.  You may want to follow a Mediterranean diet. This diet includes a lot of vegetables, lean meats or fish, whole grains, fruits, and healthy oils and fats. WHAT FOODS CAN I EAT? Grains Whole grains, such as whole-wheat or whole-grain breads, crackers, cereals, and pasta. Unsweetened oatmeal. Bulgur. Barley. Quinoa. Brown rice. Corn or whole-wheat flour tortillas or taco shells. Vegetables Lettuce. Spinach. Peas. Beets. Cauliflower. Cabbage. Broccoli. Carrots. Tomatoes. Squash. Eggplant. Herbs. Peppers. Onions. Cucumbers. Brussels sprouts. Fruits Berries. Bananas. Apples. Oranges. Grapes. Papaya. Mango. Pomegranate. Kiwi. Grapefruit. Cherries. Meats and Other Protein Sources Seafood. Lean meats, such as chicken and [place] or lean cuts of pork and beef. Tofu. Eggs. Nuts. Beans. Dairy Low-fat or fat-free dairy products, such as yogurt, cottage cheese, and cheese. Beverages Water. Tea. Coffee. Sugar-free or diet soda. Seltzer water. Milk. Milk alternatives, such as soy or almond milk. Condiments Mustard. Relish. Low-fat, low-sugar ketchup. Low-fat, low-sugar barbecue sauce. Low-fat or fat-free mayonnaise. Sweets and Desserts Sugar-free or low-fat pudding. Sugar-free or low-fat ice cream and other frozen treats. Fats and Oils Avocado. Walnuts. Olive oil. The  items listed above may not be a complete list of recommended foods or beverages. Contact your dietitian for more options.  WHAT FOODS ARE NOT RECOMMENDED? Grains Refined white flour and flour products, such as bread, pasta, snack foods, and cereals. Beverages Sweetened drinks, such as  sweet iced tea and soda. Sweets and Desserts Baked goods, such as cake, cupcakes, pastries, cookies, and cheesecake. The items listed above may not be a complete list of foods and beverages to avoid. Contact your dietitian for more information.   This information is not intended to replace advice given to you by your health care provider. Make sure you discuss any questions you have with your health care provider.   Document Released: 06/09/2014 Document Reviewed: 06/09/2014 Elsevier Interactive Patient Education 2016 [place].  Hyperglycemia High blood sugar (hyperglycemia) means that the level of sugar in your blood is higher than it should be. Signs of high blood sugar include:  Feeling thirsty.  Frequent peeing (urinating).  Feeling tired or sleepy.  Dry mouth.  Vision changes.  Feeling weak.  Feeling hungry but losing weight.  Numbness and tingling in your hands or feet.  Headache. When you ignore these signs, your blood sugar may keep going up. These problems may get worse, and other problems may begin. HOME CARE  Check your blood sugars as told by your doctor. Write down the numbers with the date and time.  Take the right amount of insulin or diabetes pills at the right time. Write down the dose with date and time.  Refill your insulin or diabetes pills before running out.  Watch what you eat. Follow your meal plan.  Drink liquids without sugar, such as water. Check with your doctor if you have kidney or heart disease.  Follow your doctor's orders for exercise. Exercise at the same time of day.  Keep your doctor's appointments. GET HELP RIGHT AWAY IF:   You have trouble  thinking or are confused.  You have fast breathing with fruity smelling breath.  You pass out (faint).  You have 2 to 3 days of high blood sugars and you do not know why.  You have chest pain.  You are feeling sick to your stomach (nauseous) or throwing up (vomiting).  You have sudden vision changes. MAKE SURE YOU:   Understand these instructions.  Will watch your condition.  Will get help right away if you are not doing well or get worse.   This information is not intended to replace advice given to you by your health care provider. Make sure you discuss any questions you have with your health care provider.   Document Released: 11/20/2008 Document Revised: 02/13/2014 Document Reviewed: 09/29/2014 Elsevier Interactive Patient Education [employer].

## 2015-03-26 NOTE — ED Provider Notes (Signed)
CSN: [id number]     Arrival date & time 03/26/15  1759 History   First MD Initiated Contact with Patient 03/26/15 2048     Chief Complaint  Patient presents with  . Chest Pain     (Consider location/radiation/quality/duration/timing/severity/associated sxs/prior Treatment) HPI Comments: Tasha [name] is a 55 y.o. female with a PMHx of conversion disorder, TMJ pain, "borderline diabetes", HLD, obesity, hypothyroidism, GERD, IBS, dysphagia, myalgias, chronic headaches, anxiety, depression, endometriosis, and periodic limb movement disorder, with a PSHx of abd hysterectomy and other surgeries as noted below, who presents to the ED with complaints of sudden onset chest pain that began around 5 PM while she was sitting on her deck at rest. She describes the pain as 3/10 pressure-like constant nonradiating pain in the left upper chest, worse with inspiration, and with no treatment tried prior to arrival. She states that initially she felt short of breath but that resolved shortly after arrival. She admits to being a chronic smoker. Positive family history of CAD and CABG in paternal grandmother and grandfather, positive family history of DVT in multiple cousins, aunt, and paternal grandmother. No personal history of DVT or PE. She admits to being stressed recently with multiple family issues and a death in the family. She states that she does have GERD chronically, but she had not eaten prior to the onset of her symptoms.  She denies any fevers, chills, lightheadedness, diaphoresis, cough, wheezing, leg swelling, recent travel/surgery/immobilization, estrogen use, active cancer, orthopnea, claudication, abdominal pain, nausea, vomiting, diarrhea, constipation, dysuria, hematuria, numbness, tingling, or focal weakness.  Chart review reveals very similar complaints of CP/SOB in 11/2013, was seen at her PCPs office, had a lexiscan which was unremarkable.  Patient is a 55 y.o. female presenting with chest pain.  The history is provided by the patient and medical records. No language interpreter was used.  Chest Pain Pain location:  L chest Pain quality: pressure   Pain radiates to:  Does not radiate Pain radiates to the back: no   Pain severity:  Moderate Onset quality:  Sudden Duration:  5 hours Timing:  Constant Progression:  Improving Chronicity:  New Context: at rest   Relieved by:  None tried Worsened by:  Deep breathing Ineffective treatments:  None tried Associated symptoms: shortness of breath (initially, now resolved)   Associated symptoms: no abdominal pain, no claudication, no cough, no diaphoresis, no fever, no lower extremity edema, no nausea, no numbness, no orthopnea, not vomiting and no weakness   Risk factors: diabetes mellitus (pre-diabetic), high cholesterol and smoking   Risk factors: no birth control, no coronary artery disease, no hypertension, no immobilization, no prior DVT/PE and no surgery     Past Medical History  Diagnosis Date  . Conversion disorder   . Arthralgia of temporomandibular joint   . Mitral valve disorders   . Pure hypercholesterolemia   . Unspecified hypothyroidism   . Overweight(278.02)   . Esophageal reflux   . Dysphagia, unspecified(787.20)   . Irritable bowel syndrome   . Myalgia and myositis, unspecified   . Headache(784.0)   . Anxiety state, unspecified   . Depressive disorder, not elsewhere classified   . Periodic limb movement disorder   . Endometriosis    Past Surgical History  Procedure Laterality Date  . Tonsillectomy    . Wrist surgery    . Arm surgery    . Foot surgery    . Abdominal hysterectomy      TAH  . Lipoma excision  BESIDES [facility]   Family History  Problem Relation Age of Onset  . Heart disease Mother   . Heart disease Father   . Breast cancer Maternal Aunt   . Colon cancer Paternal Aunt   . Diabetes Father    Social History  Substance Use Topics  . Smoking status: Current Every Day Smoker --  1.00 packs/day    Types: Cigarettes  . Smokeless tobacco: Never Used  . Alcohol Use: No   OB History    Gravida Para Term Preterm AB TAB SAB Ectopic Multiple Living   5 2   3  2 1  2      Review of Systems  Constitutional: Negative for fever, chills and diaphoresis.  Respiratory: Positive for shortness of breath (initially, now resolved). Negative for cough and wheezing.   Cardiovascular: Positive for chest pain. Negative for orthopnea, claudication and leg swelling.  Gastrointestinal: Negative for nausea, vomiting, abdominal pain, diarrhea and constipation.  Genitourinary: Negative for dysuria and hematuria.  Musculoskeletal: Negative for myalgias and arthralgias.  Skin: Negative for color change.  Allergic/Immunologic: Positive for immunocompromised state (pre-diabetic).  Neurological: Negative for weakness, light-headedness and numbness.  Psychiatric/Behavioral: Negative for confusion.   10 Systems reviewed and are negative for acute change except as noted in the HPI.    Allergies  Statins; Crestor; Lipitor; and Zocor  Home Medications   Prior to Admission medications   Medication Sig Start Date End Date Taking? Authorizing Provider  ALPRAZolam (XANAX) 0.5 MG tablet TAKE 1 TABLET BY MOUTH 4 TIMES A DAY AS NEEDED FOR ANXIETY 02/21/15  Yes Janith Lima, MD  buPROPion (WELLBUTRIN XL) 300 MG 24 hr tablet Take 1 tablet (300 mg total) by mouth daily. 12/09/14  Yes Janith Lima, MD  Cholecalciferol 50000 UNITS TABS Take 1 tablet by mouth once a week. Patient taking differently: Take 1 tablet by mouth every Monday.  12/09/14  Yes Janith Lima, MD  ezetimibe (ZETIA) 10 MG tablet Take 1 tablet (10 mg total) by mouth daily. 12/09/14  Yes Janith Lima, MD  HYDROcodone-acetaminophen (NORCO/VICODIN) 5-325 MG tablet Take 1 tablet by mouth every 6 (six) hours as needed. Not to exceed 4 tablets a day 01/27/15  Yes Janith Lima, MD  levothyroxine (SYNTHROID) 200 MCG tablet Take 1 tablet  (200 mcg total) by mouth daily before breakfast. 12/09/14  Yes Janith Lima, MD  levothyroxine (SYNTHROID, LEVOTHROID) 50 MCG tablet Take 50 mcg by mouth daily before breakfast.   Yes Historical Provider, MD  meloxicam (MOBIC) 15 MG tablet Take 1 tablet (15 mg total) by mouth daily. Patient taking differently: Take 15 mg by mouth daily as needed for pain.  12/09/14  Yes Janith Lima, MD  omeprazole (PRILOSEC) 40 MG capsule Take 2 capsules (80 mg total) by mouth daily. 10/08/14  Yes Janith Lima, MD  sertraline (ZOLOFT) 100 MG tablet Take 1 tablet (100 mg total) by mouth daily. Patient taking differently: Take 100 mg by mouth at bedtime.  12/09/14  Yes Janith Lima, MD  simethicone (GAS-X) 80 MG chewable tablet Chew 80 mg by mouth every 6 (six) hours as needed for flatulence.   Yes Historical Provider, MD  cyclobenzaprine (FLEXERIL) 10 MG tablet Take 1 tablet (10 mg total) by mouth 2 (two) times daily as needed for muscle spasms. 12/09/14   Janith Lima, MD  metroNIDAZOLE (FLAGYL) 500 MG tablet Take 1 tablet (500 mg total) by mouth 2 (two) times daily. Patient not taking:  Reported on 02/24/2015 01/18/15   Terrance Mass, MD   BP 114/56 mmHg  Pulse 68  Temp(Src) 97.7 F (36.5 C) (Oral)  Resp 12  Ht 5\' 7"  (1.702 m)  Wt 77.111 kg  BMI 26.62 kg/m2  SpO2 97% Physical Exam  Constitutional: She is oriented to person, place, and time. Vital signs are normal. She appears well-developed and well-nourished.  Non-toxic appearance. No distress.  Afebrile, nontoxic, NAD  HENT:  Head: Normocephalic and atraumatic.  Mouth/Throat: Oropharynx is clear and moist and mucous membranes are normal.  Eyes: Conjunctivae and EOM are normal. Right eye exhibits no discharge. Left eye exhibits no discharge.  Neck: Normal range of motion. Neck supple.  Cardiovascular: Normal rate, regular rhythm, normal heart sounds and intact distal pulses.  Exam reveals no gallop and no friction rub.   No murmur heard. RRR,  nl s1/s2, no m/r/g, distal pulses intact, no pedal edema   Pulmonary/Chest: Effort normal and breath sounds normal. No respiratory distress. She has no decreased breath sounds. She has no wheezes. She has no rhonchi. She has no rales. She exhibits tenderness. She exhibits no crepitus, no deformity and no retraction.    CTAB in all lung fields, no w/r/r, no hypoxia or increased WOB, speaking in full sentences, SpO2 97% on RA  Chest wall with mild TTP in the L side of the anterior chest, but without crepitus, deformities, or retractions   Abdominal: Soft. Normal appearance and bowel sounds are normal. She exhibits no distension. There is no tenderness. There is no rigidity, no rebound, no guarding, no CVA tenderness, no tenderness at McBurney's point and negative Murphy's sign.  Musculoskeletal: Normal range of motion.  MAE x4 Strength and sensation grossly intact Distal pulses intact Gait steady No pedal edema, neg homan's bilaterally   Neurological: She is alert and oriented to person, place, and time. She has normal strength. No sensory deficit.  Skin: Skin is warm, dry and intact. No rash noted.  Psychiatric: She has a normal mood and affect.  Nursing note and vitals reviewed.   ED Course  Procedures (including critical care time) Labs Review Labs Reviewed  BASIC METABOLIC PANEL - Abnormal; Notable for the following:    Glucose, Bld 212 (*)    All other components within normal limits  CBC  TROPONIN I  D-DIMER, QUANTITATIVE (NOT AT [facility])  I-STAT TROPOININ, ED    Imaging Review Dg Chest 2 View  03/26/2015  CLINICAL DATA:  Left chest pain and pressure, left arm weakness and shortness of breath since 5 p.m. today. Smoker. EXAM: CHEST  2 VIEW COMPARISON:  12/09/2014. FINDINGS: Normal sized heart. Clear lungs. Stable diffuse peribronchial thickening and accentuation of the interstitial markings. Thoracic spine degenerative changes. IMPRESSION: No acute abnormality.  Stable chronic  bronchitic changes. Electronically Signed   By: Claudie Revering M.D.   On: 03/26/2015 19:06   Lexiscan 12/16/13: Overall Impression: Normal stress nuclear study. There is no scar or ischemia. There is no change since the report of the study from September, 2010. LV Ejection Fraction: 63%. LV Wall Motion: Normal Wall Motion. Dola Argyle, MD  I have personally reviewed and evaluated these images and lab results as part of my medical decision-making.   EKG Interpretation   Date/Time:  Friday March 26 2015 18:02:36 EST Ventricular Rate:  77 PR Interval:  146 QRS Duration: 70 QT Interval:  382 QTC Calculation: 432 R Axis:   48 Text Interpretation:  Normal sinus rhythm Non-specific ST-t changes No  significant change since last tracing Confirmed by Wilson Singer  MD, STEPHEN  [phone]) on 03/26/2015 10:51:45 PM      MDM   Final diagnoses:  Atypical chest pain  SOB (shortness of breath)  Hyperglycemia  Tobacco use  Stress at home    55 y.o. female here with sudden onset CP/SOB that began while at rest. Pain is somewhat reproducible on exam, located in the L upper chest. States the SOB resolved shortly after arrival here. +FHx of DVTs in multiple family members, no personal hx. No tachycardia or hypoxia, no other RFs, but PERC+ due to age and family hx, therefore will obtain d-dimer to evaluate; if negative, given that she's relatively low risk, doubt we would need further studies for that. CXR clear, EKG unremarkable for acute ischemic changes and unchanged from prior EKGs, Trop neg at 2hr mark which was 3hrs ago therefore will repeat now, CBC WNL, and BMP with gluc 212 which is similar to prior readings (pt is borderline diabetic). +FHx of cardiac disease in her family members. Will give ASA and GI cocktail, will avoid morphine or NTG since pt has BPs that are borderline (114/56) so want to avoid hypotension. Will reassess shortly.  11:29 PM Pain completely resolved after ASA and GI cocktail,  which could mean that this pain was indigestion related. Dimer neg, repeat trop neg. HEART score 3, doubt need for admission, doubt this is ACS. Neg stress test in 12/2013 which is reassuring. Trop x2 neg, I feel comfortable that this does not need admission or further work up today. Discussed using tums/maalox PRN, tylenol PRN for pain, and stress management. F/up with PCP in 3-5 days for recheck of ongoing symptoms, and to discuss her hyperglycemia and see if she needs repeat HgbA1C and potentially start meds for diabetes.  I explained the diagnosis and have given explicit precautions to return to the ER including for any other new or worsening symptoms. The patient understands and accepts the medical plan as it's been dictated and I have answered their questions. Discharge instructions concerning home care and prescriptions have been given. The patient is STABLE and is discharged to home in good condition.  BP 114/58 mmHg  Pulse 66  Temp(Src) 97.7 F (36.5 C) (Oral)  Resp 15  Ht 5\' 7"  (1.702 m)  Wt 77.111 kg  BMI 26.62 kg/m2  SpO2 96%  Meds ordered this encounter  Medications  . aspirin tablet 325 mg    Sig:   . gi cocktail (Maalox,Lidocaine,Donnatal)    Sig:      Tasha Van Camprubi-Soms, PA-C 03/26/15 2337  Virgel Manifold, MD 03/27/15 [phone]

## 2015-03-26 NOTE — ED Notes (Signed)
The pt is c/o lt upper chest pain with lt arm radiation for there past hour  C/o sl sob pain worse with inspiration

## 2015-04-01 ENCOUNTER — Telehealth: Payer: Self-pay | Admitting: Internal Medicine

## 2015-04-01 DIAGNOSIS — E039 Hypothyroidism, unspecified: Secondary | ICD-10-CM

## 2015-04-01 DIAGNOSIS — K219 Gastro-esophageal reflux disease without esophagitis: Secondary | ICD-10-CM

## 2015-04-01 NOTE — Telephone Encounter (Signed)
Patient walked in to request med refills. Made an appt for 04/08/2015. She is asking that we refill all meds to [place]. She also states that she'd need to pick up her HYDROcodone-acetaminophen (NORCO/VICODIN) 5-325 MG tablet [[id number]] here. She states that she is totally out of all medications

## 2015-04-02 ENCOUNTER — Telehealth: Payer: Self-pay

## 2015-04-02 DIAGNOSIS — M170 Bilateral primary osteoarthritis of knee: Secondary | ICD-10-CM

## 2015-04-02 DIAGNOSIS — IMO0001 Reserved for inherently not codable concepts without codable children: Secondary | ICD-10-CM

## 2015-04-02 MED ORDER — EZETIMIBE 10 MG PO TABS: 10.0000 mg | ORAL_TABLET | Freq: Every day | ORAL | Status: DC

## 2015-04-02 MED ORDER — OMEPRAZOLE 40 MG PO CPDR: 80.0000 mg | DELAYED_RELEASE_CAPSULE | Freq: Every day | ORAL | Status: DC

## 2015-04-02 MED ORDER — LEVOTHYROXINE SODIUM 50 MCG PO TABS: 50.0000 ug | ORAL_TABLET | Freq: Every day | ORAL | Status: DC

## 2015-04-02 MED ORDER — BUPROPION HCL ER (XL) 300 MG PO TB24: 300.0000 mg | ORAL_TABLET | Freq: Every day | ORAL | Status: DC

## 2015-04-02 MED ORDER — LEVOTHYROXINE SODIUM 200 MCG PO TABS: 200.0000 ug | ORAL_TABLET | Freq: Every day | ORAL | Status: DC

## 2015-04-02 NOTE — Telephone Encounter (Signed)
erx done only.

## 2015-04-02 NOTE — Telephone Encounter (Signed)
Request for hydrocodone.  

## 2015-04-04 MED ORDER — HYDROCODONE-ACETAMINOPHEN 5-325 MG PO TABS: 1.0000 | ORAL_TABLET | Freq: Four times a day (QID) | ORAL | Status: DC | PRN

## 2015-04-04 NOTE — Telephone Encounter (Signed)
done

## 2015-04-05 ENCOUNTER — Telehealth: Payer: Self-pay | Admitting: *Deleted

## 2015-04-05 DIAGNOSIS — M8580 Other specified disorders of bone density and structure, unspecified site: Secondary | ICD-10-CM

## 2015-04-05 NOTE — Telephone Encounter (Signed)
[name] called stating dexa order dx is incorrect insurance will not pay for this diagnosis. I reviewed pt dexa in 05/2013 and it was noted for osteopenia, new order will be placed with this diagnosis.

## 2015-04-07 ENCOUNTER — Ambulatory Visit: Payer: Medicare Other

## 2015-04-07 ENCOUNTER — Other Ambulatory Visit: Payer: Medicare Other

## 2015-04-08 ENCOUNTER — Other Ambulatory Visit (INDEPENDENT_AMBULATORY_CARE_PROVIDER_SITE_OTHER): Payer: Managed Care, Other (non HMO)

## 2015-04-08 ENCOUNTER — Ambulatory Visit (INDEPENDENT_AMBULATORY_CARE_PROVIDER_SITE_OTHER): Payer: Managed Care, Other (non HMO) | Admitting: Internal Medicine

## 2015-04-08 ENCOUNTER — Encounter: Payer: Self-pay | Admitting: Internal Medicine

## 2015-04-08 VITALS — BP 122/70 | HR 73 | Temp 98.9°F | Resp 16 | Ht 67.0 in | Wt 165.0 lb

## 2015-04-08 DIAGNOSIS — E118 Type 2 diabetes mellitus with unspecified complications: Secondary | ICD-10-CM | POA: Diagnosis not present

## 2015-04-08 DIAGNOSIS — E038 Other specified hypothyroidism: Secondary | ICD-10-CM | POA: Diagnosis not present

## 2015-04-08 DIAGNOSIS — E785 Hyperlipidemia, unspecified: Secondary | ICD-10-CM

## 2015-04-08 DIAGNOSIS — R072 Precordial pain: Secondary | ICD-10-CM

## 2015-04-08 LAB — URINALYSIS, ROUTINE W REFLEX MICROSCOPIC
Bilirubin Urine: NEGATIVE
Hgb urine dipstick: NEGATIVE
Ketones, ur: NEGATIVE
Leukocytes, UA: NEGATIVE
Nitrite: NEGATIVE
Specific Gravity, Urine: 1.025 (ref 1.000–1.030)
Total Protein, Urine: NEGATIVE
Urine Glucose: NEGATIVE
Urobilinogen, UA: 0.2 (ref 0.0–1.0)
pH: 5.5 (ref 5.0–8.0)

## 2015-04-08 LAB — HEMOGLOBIN A1C: Hgb A1c MFr Bld: 8.2 % — ABNORMAL HIGH (ref 4.6–6.5)

## 2015-04-08 MED FILL — ALPRAZolam 0.5 MG TABS: 0.5 | 30 days supply | Qty: 120 | Fill #0

## 2015-04-08 MED FILL — OMEPRAZOLE DR 40 MG CAPSULE: 40 | 30 days supply | Qty: 30 | Fill #0

## 2015-04-08 MED FILL — VITAMIN D3 50000 UNIT CAPS: 1.25 MG | 84 days supply | Qty: 12 | Fill #0

## 2015-04-08 MED FILL — HYDROCODON-APAP 5-325: 5-325 | 30 days supply | Qty: 120 | Fill #0

## 2015-04-08 MED FILL — LEVOTHYROXINE 50 MCG TABLET: 50 | 30 days supply | Qty: 30 | Fill #0

## 2015-04-08 MED FILL — LEVOTHYROXINE 200 MCG TAB: 200 | 30 days supply | Qty: 30 | Fill #0

## 2015-04-08 MED FILL — MELOXICAM 15 MG TABLET: 15 | 30 days supply | Qty: 30 | Fill #0

## 2015-04-08 NOTE — Progress Notes (Signed)
Subjective:  Patient ID: Tasha [name], female    DOB: 04-30-1960  Age: 55 y.o. MRN: [id number]  CC: Chest Pain; Hypothyroidism; and Diabetes   HPI ODELL BROCCOLI presents for a follow-up after a recent ER visit for left-sided chest pain. She was screened for ischemia and pulmonary embolus and the workup was negative. She tells me that the chest pain is quickly resolving. She still has a mild intermittent discomfort on the left anterior chest wall that she describes as a dull ache at rest. She gets symptom relief with her current medications. She denies cough, hemoptysis, shortness of breath, diaphoresis, edema, dizziness, or syncope.  Outpatient Prescriptions Prior to Visit  Medication Sig Dispense Refill  . ALPRAZolam (XANAX) 0.5 MG tablet TAKE 1 TABLET BY MOUTH 4 TIMES A DAY AS NEEDED FOR ANXIETY 120 tablet 1  . buPROPion (WELLBUTRIN XL) 300 MG 24 hr tablet Take 1 tablet (300 mg total) by mouth daily. 30 tablet 0  . Cholecalciferol 50000 UNITS TABS Take 1 tablet by mouth once a week. (Patient taking differently: Take 1 tablet by mouth every Monday. ) 12 tablet 3  . cyclobenzaprine (FLEXERIL) 10 MG tablet Take 1 tablet (10 mg total) by mouth 2 (two) times daily as needed for muscle spasms. 20 tablet 0  . ezetimibe (ZETIA) 10 MG tablet Take 1 tablet (10 mg total) by mouth daily. 30 tablet 0  . HYDROcodone-acetaminophen (NORCO/VICODIN) 5-325 MG tablet Take 1 tablet by mouth every 6 (six) hours as needed. Not to exceed 4 tablets a day 120 tablet 0  . levothyroxine (SYNTHROID) 200 MCG tablet Take 1 tablet (200 mcg total) by mouth daily before breakfast. 30 tablet 0  . meloxicam (MOBIC) 15 MG tablet Take 1 tablet (15 mg total) by mouth daily. (Patient taking differently: Take 15 mg by mouth daily as needed for pain. ) 30 tablet 6  . omeprazole (PRILOSEC) 40 MG capsule Take 2 capsules (80 mg total) by mouth daily. 60 capsule 0  . sertraline (ZOLOFT) 100 MG tablet Take 1 tablet (100 mg total) by mouth  daily. (Patient taking differently: Take 100 mg by mouth at bedtime. ) 90 tablet 3  . simethicone (GAS-X) 80 MG chewable tablet Chew 80 mg by mouth every 6 (six) hours as needed for flatulence.    Marland Kitchen levothyroxine (SYNTHROID, LEVOTHROID) 50 MCG tablet Take 1 tablet (50 mcg total) by mouth daily before breakfast. 30 tablet 0  . metroNIDAZOLE (FLAGYL) 500 MG tablet Take 1 tablet (500 mg total) by mouth 2 (two) times daily. 14 tablet 0   No facility-administered medications prior to visit.    ROS Review of Systems  Constitutional: Negative.  Negative for fever, chills, diaphoresis, appetite change and fatigue.  HENT: Negative.   Eyes: Negative.   Respiratory: Negative.  Negative for cough, choking, chest tightness, shortness of breath and stridor.   Cardiovascular: Negative.  Negative for chest pain, palpitations and leg swelling.  Gastrointestinal: Negative.  Negative for nausea, vomiting, abdominal pain, diarrhea, constipation and blood in stool.  Endocrine: Negative.   Genitourinary: Negative.   Musculoskeletal: Negative.  Negative for myalgias, back pain, arthralgias and neck pain.  Skin: Negative.  Negative for color change and pallor.  Allergic/Immunologic: Negative.   Neurological: Negative.  Negative for dizziness, tremors, weakness, light-headedness, numbness and headaches.  Hematological: Negative.  Negative for adenopathy. Does not bruise/bleed easily.  Psychiatric/Behavioral: Negative.     Objective:  BP 122/70 mmHg  Pulse 73  Temp(Src) 98.9 F (37.2 C) (  Oral)  Resp 16  Ht 5\' 7"  (1.702 m)  Wt 165 lb (74.844 kg)  BMI 25.84 kg/m2  SpO2 97%  BP Readings from Last 3 Encounters:  04/08/15 122/70  03/26/15 116/70  02/24/15 134/82    Wt Readings from Last 3 Encounters:  04/08/15 165 lb (74.844 kg)  03/26/15 170 lb (77.111 kg)  01/18/15 170 lb (77.111 kg)    Physical Exam  Constitutional: She is oriented to person, place, and time. No distress.  HENT:    Mouth/Throat: Oropharynx is clear and moist. No oropharyngeal exudate.  Eyes: Conjunctivae are normal. Right eye exhibits no discharge. Left eye exhibits no discharge. No scleral icterus.  Neck: Normal range of motion. Neck supple. No JVD present. No tracheal deviation present. No thyromegaly present.  Cardiovascular: Normal rate, regular rhythm, normal heart sounds and intact distal pulses.  Exam reveals no gallop and no friction rub.   No murmur heard. EKG - NSR, no Q waves, no ST/T wave changes  NORMAL EKG  Pulmonary/Chest: Effort normal and breath sounds normal. No stridor. No respiratory distress. She has no decreased breath sounds. She has no wheezes. She has no rhonchi. She has no rales. She exhibits no mass, no tenderness, no bony tenderness, no edema and no deformity.  Abdominal: Soft. Bowel sounds are normal. She exhibits no distension and no mass. There is no tenderness. There is no rebound and no guarding.  Musculoskeletal: Normal range of motion. She exhibits no edema or tenderness.  Lymphadenopathy:    She has no cervical adenopathy.  Neurological: She is oriented to person, place, and time.  Skin: Skin is warm and dry. No rash noted. She is not diaphoretic. No erythema. No pallor.  Psychiatric: She has a normal mood and affect. Her behavior is normal. Judgment and thought content normal.  Vitals reviewed.   Lab Results  Component Value Date   WBC 9.7 03/26/2015   HGB 13.5 03/26/2015   HCT 41.1 03/26/2015   PLT 174 03/26/2015   GLUCOSE 192* 04/08/2015   CHOL 200 04/08/2015   TRIG 179.0* 04/08/2015   HDL 36.00* 04/08/2015   LDLDIRECT 158.0 12/01/2013   LDLCALC 128* 04/08/2015   ALT 34 05/27/2014   AST 34 05/27/2014   NA 138 04/08/2015   K 3.8 04/08/2015   CL 105 04/08/2015   CREATININE 0.82 04/08/2015   BUN 13 04/08/2015   CO2 24 04/08/2015   TSH 0.02* 04/08/2015   HGBA1C 8.2* 04/08/2015   MICROALBUR 0.7 04/08/2015    Dg Chest 2 View  03/26/2015  CLINICAL  DATA:  Left chest pain and pressure, left arm weakness and shortness of breath since 5 p.m. today. Smoker. EXAM: CHEST  2 VIEW COMPARISON:  12/09/2014. FINDINGS: Normal sized heart. Clear lungs. Stable diffuse peribronchial thickening and accentuation of the interstitial markings. Thoracic spine degenerative changes. IMPRESSION: No acute abnormality.  Stable chronic bronchitic changes. Electronically Signed   By: Claudie Revering M.D.   On: 03/26/2015 19:06    Assessment & Plan:   [name] was seen today for chest pain, hypothyroidism and diabetes.  Diagnoses and all orders for this visit:  Other specified hypothyroidism- Her TSH is suppressed, I've asked her to discontinue the 50 g dose but she will continue the 200 g dose. -     TSH; Future  Type 2 diabetes mellitus with complication, without long-term current use of insulin ([place])- her A1c is up-to-date 0.2%, I've asked her to start taking metformin. -     Basic metabolic  panel; Future -     Hemoglobin A1c; Future -     Urinalysis, Routine w reflex microscopic (not at [facility]); Future -     Microalbumin / creatinine urine ratio; Future -     metFORMIN (GLUCOPHAGE) 1000 MG tablet; Take 1 tablet (1,000 mg total) by mouth 2 (two) times daily with a meal.  Hyperlipidemia with target LDL less than 130- she has not achieved her LDL goal and is not willing to take a statin, will continue Zetia. -     Lipid panel; Future  Precordial chest pain- her EKG is normal, I don't think her chest pain is ischemia, most likely musculoskeletal, she will continue her current medications for symptom relief. -     EKG 12-Lead   I have discontinued Ms. Papadopoulos's metroNIDAZOLE. I am also having her start on metFORMIN. Additionally, I am having her maintain her Cholecalciferol, cyclobenzaprine, sertraline, meloxicam, ALPRAZolam, simethicone, levothyroxine, omeprazole, buPROPion, ezetimibe, and HYDROcodone-acetaminophen.  Meds ordered this encounter  Medications  .  metFORMIN (GLUCOPHAGE) 1000 MG tablet    Sig: Take 1 tablet (1,000 mg total) by mouth 2 (two) times daily with a meal.    Dispense:  180 tablet    Refill:  1     Follow-up: Return in about 4 months (around 08/08/2015).  [name], MD

## 2015-04-08 NOTE — Patient Instructions (Signed)

## 2015-04-08 NOTE — Progress Notes (Signed)
Pre visit review using our clinic review tool, if applicable. No additional management support is needed unless otherwise documented below in the visit note. 

## 2015-04-09 LAB — BASIC METABOLIC PANEL
BUN: 13 mg/dL (ref 6–23)
CO2: 24 mEq/L (ref 19–32)
Calcium: 9.4 mg/dL (ref 8.4–10.5)
Chloride: 105 mEq/L (ref 96–112)
Creatinine, Ser: 0.82 mg/dL (ref 0.40–1.20)
GFR: 76.64 mL/min (ref >60.00)
Glucose, Bld: 192 mg/dL — ABNORMAL HIGH (ref 70–99)
Potassium: 3.8 mEq/L (ref 3.5–5.1)
Sodium: 138 mEq/L (ref 135–145)

## 2015-04-09 LAB — MICROALBUMIN / CREATININE URINE RATIO
Creatinine,U: 150.3 mg/dL
Microalb Creat Ratio: 0.5 mg/g (ref 0.0–30.0)
Microalb, Ur: 0.7 mg/dL (ref 0.0–1.9)

## 2015-04-09 LAB — LIPID PANEL
Cholesterol: 200 mg/dL (ref 0–200)
HDL: 36 mg/dL — ABNORMAL LOW (ref >39.00)
LDL Cholesterol: 128 mg/dL — ABNORMAL HIGH (ref 0–99)
NonHDL: 163.87
Total CHOL/HDL Ratio: 6
Triglycerides: 179 mg/dL — ABNORMAL HIGH (ref 0.0–149.0)
VLDL: 35.8 mg/dL (ref 0.0–40.0)

## 2015-04-09 LAB — TSH: TSH: 0.02 u[IU]/mL — ABNORMAL LOW (ref 0.35–4.50)

## 2015-04-11 MED ORDER — METFORMIN HCL 1000 MG PO TABS: 1000.0000 mg | ORAL_TABLET | Freq: Two times a day (BID) | ORAL | Status: DC

## 2015-04-12 MED FILL — metFORMIN HCL 1000 MG TABS: 1000 | 30 days supply | Qty: 60 | Fill #0

## 2015-04-15 ENCOUNTER — Telehealth: Payer: Self-pay | Admitting: Internal Medicine

## 2015-04-15 NOTE — Telephone Encounter (Signed)
Informed pt of labs. Stated she does not have access to her myhcart anymore. She cannot see anything tried sending her a message and got an error. Will get with dustin in the morning to try and correct this

## 2015-04-15 NOTE — Telephone Encounter (Signed)
Please call patient back with lab results

## 2015-04-16 NOTE — Telephone Encounter (Signed)
According to [facility] her account was deactivated. Sent her a message and informed her to let me kno if she any more problems to let us know

## 2015-04-26 ENCOUNTER — Telehealth: Payer: Self-pay | Admitting: Internal Medicine

## 2015-04-26 MED ORDER — ACYCLOVIR 5 % EX OINT: 1.0000 "application " | TOPICAL_OINTMENT | CUTANEOUS | Status: DC

## 2015-04-26 NOTE — Telephone Encounter (Signed)
Please advise 

## 2015-04-26 NOTE — Telephone Encounter (Signed)
Rx sent 

## 2015-04-26 NOTE — Telephone Encounter (Signed)
LMOVM informing pt  

## 2015-04-26 NOTE — Telephone Encounter (Signed)
Pt has a pretty bad fever blister and is requesting the ointment you have prescribed before for her. Pharmacy is [facility]

## 2015-05-12 ENCOUNTER — Other Ambulatory Visit: Payer: Self-pay | Admitting: Internal Medicine

## 2015-05-12 MED ORDER — ALPRAZOLAM 0.5 MG PO TABS: ORAL_TABLET | ORAL | Status: DC

## 2015-05-12 MED FILL — MELOXICAM 15 MG TABLET: 15 | 30 days supply | Qty: 30 | Fill #1

## 2015-05-12 MED FILL — metFORMIN HCL 1000 MG TABS: 1000 | 30 days supply | Qty: 60 | Fill #1

## 2015-05-12 MED FILL — OMEPRAZOLE DR 40 MG CAPSULE: 40 | 30 days supply | Qty: 60 | Fill #0

## 2015-05-12 MED FILL — ALPRAZolam 0.5 MG TABS: 0.5 | 30 days supply | Qty: 120 | Fill #0

## 2015-05-12 MED FILL — LEVOTHYROXINE 200 MCG TAB: 200 | 30 days supply | Qty: 30 | Fill #0

## 2015-05-12 MED FILL — BUPROPION HCL XL 300 MG TAB: 300 | 30 days supply | Qty: 30 | Fill #0

## 2015-05-12 NOTE — Telephone Encounter (Signed)
Faxed script back to [facility]...lmb 

## 2015-05-13 ENCOUNTER — Other Ambulatory Visit: Payer: Self-pay | Admitting: Internal Medicine

## 2015-05-13 MED FILL — SERTRALINE HCL 100 MG TAB: 100 | 30 days supply | Qty: 30 | Fill #0

## 2015-05-31 ENCOUNTER — Telehealth: Payer: Self-pay | Admitting: Internal Medicine

## 2015-05-31 DIAGNOSIS — IMO0001 Reserved for inherently not codable concepts without codable children: Secondary | ICD-10-CM

## 2015-05-31 DIAGNOSIS — M170 Bilateral primary osteoarthritis of knee: Secondary | ICD-10-CM

## 2015-05-31 MED ORDER — HYDROCODONE-ACETAMINOPHEN 5-325 MG PO TABS: 1.0000 | ORAL_TABLET | Freq: Four times a day (QID) | ORAL | Status: DC | PRN

## 2015-05-31 MED ORDER — SERTRALINE HCL 100 MG PO TABS: 100.0000 mg | ORAL_TABLET | Freq: Every day | ORAL | Status: DC

## 2015-05-31 MED ORDER — CLOBETASOL PROPIONATE 0.05 % EX OINT: 1.0000 "application " | TOPICAL_OINTMENT | Freq: Two times a day (BID) | CUTANEOUS | Status: DC

## 2015-05-31 NOTE — Telephone Encounter (Signed)
done

## 2015-05-31 NOTE — Telephone Encounter (Signed)
Pt aware to come pick up the hydrocodone here and other rx at the pharmacy.

## 2015-05-31 NOTE — Telephone Encounter (Signed)
Pt called stated she got poison ivy all over her and was wondering if Dr. Ronnald Ramp can send something into [facility] outpatient pharmacy. Pt also request refill for Zoloft and hydrocodone. Please call her back

## 2015-06-01 ENCOUNTER — Ambulatory Visit: Payer: Medicare Other

## 2015-06-01 ENCOUNTER — Other Ambulatory Visit: Payer: Medicare Other

## 2015-06-05 DIAGNOSIS — L237 Allergic contact dermatitis due to plants, except food: Secondary | ICD-10-CM | POA: Diagnosis not present

## 2015-06-11 MED FILL — HYDROCODON-APAP 5-325: 5-325 | 30 days supply | Qty: 120 | Fill #0

## 2015-06-11 MED FILL — ALPRAZolam 0.5 MG TABS: 0.5 | 30 days supply | Qty: 120 | Fill #1

## 2015-06-11 MED FILL — LEVOTHYROXINE 200 MCG TAB: 200 | 30 days supply | Qty: 30 | Fill #1

## 2015-07-02 MED FILL — OMEPRAZOLE DR 40 MG CAPSULE: 40 | 30 days supply | Qty: 60 | Fill #1

## 2015-07-02 MED FILL — VITAMIN D3 50000 UNIT CAPS: 1.25 MG | 84 days supply | Qty: 12 | Fill #1

## 2015-07-02 MED FILL — BUPROPION HCL XL 300 MG TAB: 300 | 30 days supply | Qty: 30 | Fill #1

## 2015-07-02 MED FILL — SERTRALINE HCL 100 MG TAB: 100 | 30 days supply | Qty: 30 | Fill #1

## 2015-07-09 ENCOUNTER — Telehealth: Payer: Self-pay | Admitting: Internal Medicine

## 2015-07-09 NOTE — Telephone Encounter (Signed)
Apt schedule with [facility] tomorrow. Pt refused ED or urgent care

## 2015-07-09 NOTE — Telephone Encounter (Signed)
LEASE NOTE: All timestamps contained within this report are represented as [place] Standard Time. CONFIDENTIALTY NOTICE: This fax transmission is intended only for the addressee. It contains information that is legally privileged, confidential or otherwise protected from use or disclosure. If you are not the intended recipient, you are strictly prohibited from reviewing, disclosing, copying using or disseminating any of this information or taking any action in reliance on or regarding this information. If you have received this fax in error, please notify us immediately by telephone so that we can arrange for its return to [place]. Phone: [phone], Toll-Free: [phone], Fax: [phone] Page: 1 of 1 Call Id: [id number] [facility] [place] [facility] - Client [place] Patient Name: Tasha [name] DOB: 1960/04/22 Initial Comment Caller is experiencing legs swelling and being discoordinated as well as headaches. She is not having symptoms now but has concerns about these symptoms. Nurse Assessment Nurse: Chesley Noon, RN, Lattie Haw Date/Time (Eastern Time): 07/09/2015 3:27:51 PM Confirm and document reason for call. If symptomatic, describe symptoms. You must click the next button to save text entered. ---Caller states she has had intermittent leg swelling, disoriented and headaches. She has been newly dx with diabetes 2 months ago, blood sugar 166 now. She only has symptoms at night, around 8-9 pm for the last 3 nights. Swelling in both legs from knee to ankle at night, none now. She felt dizzy earlier but ate and then felt better. Intermittent blurred vision. Fatigue. Has the patient traveled out of the country within the last 30 days? ---Not Applicable Does the patient have any new or worsening symptoms? ---Yes Will a triage be completed? ---Yes Related visit to physician within the last 2 weeks? ---No Does the PT have any chronic conditions? (i.e.  diabetes, asthma, etc.) ---Yes List chronic conditions. ---diabetes, thyroid problems (medications just decreased 2 months ago), IBS Is this a behavioral health or substance abuse call? ---No Guidelines Guideline Title Affirmed Question Affirmed Notes Diarrhea Black or tarry bowel movements (Exception: chronic-unchanged black-grey bowel movements AND is taking iron pills or Pepto-Bismol) Final Disposition User Go to ED Now Chesley Noon, RN, Lattie Haw Referrals [place] REFUSED Disagree/Comply: Disagree Disagree/Comply Reason: Disagree with instructions

## 2015-07-10 ENCOUNTER — Ambulatory Visit (INDEPENDENT_AMBULATORY_CARE_PROVIDER_SITE_OTHER): Payer: Managed Care, Other (non HMO) | Admitting: Family Medicine

## 2015-07-10 ENCOUNTER — Telehealth: Payer: Self-pay | Admitting: Emergency Medicine

## 2015-07-10 ENCOUNTER — Encounter: Payer: Self-pay | Admitting: Family Medicine

## 2015-07-10 VITALS — BP 120/78 | HR 68 | Temp 97.8°F | Resp 16 | Wt 166.0 lb

## 2015-07-10 DIAGNOSIS — R739 Hyperglycemia, unspecified: Secondary | ICD-10-CM

## 2015-07-10 DIAGNOSIS — E118 Type 2 diabetes mellitus with unspecified complications: Secondary | ICD-10-CM | POA: Diagnosis not present

## 2015-07-10 DIAGNOSIS — R7309 Other abnormal glucose: Secondary | ICD-10-CM

## 2015-07-10 LAB — GLUCOSE, POCT (MANUAL RESULT ENTRY): POC Glucose: 110 mg/dl — AB (ref 70–99)

## 2015-07-10 NOTE — Patient Instructions (Addendum)
Call your regular doc about going to diabetes education.   Call for a follow up appointment with Dr. Ronnald Ramp.   Low carb diet in the meantime.  Read up on [email or web address] and look under type 2 diabetes.  Drink plenty of water in the meantime, enough to keep your urine clear.  Take care.  Glad to see you.

## 2015-07-10 NOTE — Progress Notes (Signed)
Pre visit review using our clinic review tool, if applicable. No additional management support is needed unless otherwise documented below in the visit note.  H/o DM2.  Sugar up to 229, then up to ~400.   She has had some occ bilateral leg aches, unclear if related to sugar elevation.   Still on metformin.   Not on diabetic diet.  Not yet ready to quit smoking.   She didn't have good understanding of DM2 basics, d/w pt in general.    Meds, vitals, and allergies reviewed.   ROS: Per HPI unless specifically indicated in ROS section   GEN: nad, alert and oriented HEENT: mucous membranes moist NECK: supple w/o LA CV: rrr.  PULM: ctab, no inc wob EXT: no edema Legs not ttp- no cramping at the time of exam.  Palpable DP pulses.

## 2015-07-10 NOTE — Telephone Encounter (Signed)
Pt was in today for Saturday clinic, states that she went to pick up Zovirax 5% and cost too much, is there something else that can be sent in? Please advise.

## 2015-07-10 NOTE — Assessment & Plan Note (Signed)
She will need a meter- currently using her mother's meter.  Due for f/u A1c.   Needs DM2 education.  D/w pt about low carb diet in general.  Will defer to PCP.   Continue metformin.   She doesn't have emergent sx at this point, unclear if the leg sx prev were due to sugar elevation.   Still okay for outpatient f/u.

## 2015-07-12 NOTE — Telephone Encounter (Signed)
This was prescribed 04/26/15 for bliser on lip please advise

## 2015-07-30 ENCOUNTER — Other Ambulatory Visit: Payer: Self-pay | Admitting: Internal Medicine

## 2015-07-30 MED FILL — EZETIMIBE 10 MG TABLET: 10 | 30 days supply | Qty: 30 | Fill #0

## 2015-07-30 MED FILL — BUPROPION HCL XL 300 MG TAB: 300 | 30 days supply | Qty: 30 | Fill #2

## 2015-07-30 MED FILL — MELOXICAM 15 MG TABLET: 15 | 30 days supply | Qty: 30 | Fill #2

## 2015-07-30 MED FILL — OMEPRAZOLE DR 40 MG CAPSULE: 40 | 30 days supply | Qty: 60 | Fill #2

## 2015-07-30 MED FILL — metFORMIN HCL 1000 MG TABS: 1000 | 30 days supply | Qty: 60 | Fill #2

## 2015-07-30 MED FILL — LEVOTHYROXINE 200 MCG TAB: 200 | 30 days supply | Qty: 30 | Fill #2

## 2015-07-30 MED FILL — SERTRALINE HCL 100 MG TAB: 100 | 30 days supply | Qty: 30 | Fill #2

## 2015-08-05 ENCOUNTER — Encounter: Payer: Self-pay | Admitting: Internal Medicine

## 2015-08-05 ENCOUNTER — Other Ambulatory Visit: Payer: Self-pay | Admitting: Internal Medicine

## 2015-08-05 ENCOUNTER — Other Ambulatory Visit (INDEPENDENT_AMBULATORY_CARE_PROVIDER_SITE_OTHER): Payer: Managed Care, Other (non HMO)

## 2015-08-05 ENCOUNTER — Ambulatory Visit (INDEPENDENT_AMBULATORY_CARE_PROVIDER_SITE_OTHER): Payer: Managed Care, Other (non HMO) | Admitting: Internal Medicine

## 2015-08-05 ENCOUNTER — Ambulatory Visit (INDEPENDENT_AMBULATORY_CARE_PROVIDER_SITE_OTHER)
Admission: RE | Admit: 2015-08-05 | Discharge: 2015-08-05 | Disposition: A | Discharge status: Home / Self Care | Payer: Managed Care, Other (non HMO) | Source: Ambulatory Visit | Attending: Internal Medicine | Admitting: Internal Medicine

## 2015-08-05 VITALS — BP 122/80 | HR 66 | Temp 97.9°F | Resp 16 | Ht 67.0 in | Wt 166.0 lb

## 2015-08-05 DIAGNOSIS — M170 Bilateral primary osteoarthritis of knee: Secondary | ICD-10-CM | POA: Diagnosis not present

## 2015-08-05 DIAGNOSIS — M609 Myositis, unspecified: Secondary | ICD-10-CM

## 2015-08-05 DIAGNOSIS — E118 Type 2 diabetes mellitus with unspecified complications: Secondary | ICD-10-CM

## 2015-08-05 DIAGNOSIS — IMO0001 Reserved for inherently not codable concepts without codable children: Secondary | ICD-10-CM

## 2015-08-05 DIAGNOSIS — F418 Other specified anxiety disorders: Secondary | ICD-10-CM

## 2015-08-05 DIAGNOSIS — M79672 Pain in left foot: Secondary | ICD-10-CM | POA: Diagnosis not present

## 2015-08-05 DIAGNOSIS — E038 Other specified hypothyroidism: Secondary | ICD-10-CM | POA: Diagnosis not present

## 2015-08-05 DIAGNOSIS — M791 Myalgia: Secondary | ICD-10-CM | POA: Diagnosis not present

## 2015-08-05 DIAGNOSIS — M7732 Calcaneal spur, left foot: Secondary | ICD-10-CM | POA: Diagnosis not present

## 2015-08-05 LAB — BASIC METABOLIC PANEL
BUN: 13 mg/dL (ref 6–23)
CO2: 29 mEq/L (ref 19–32)
Calcium: 9.7 mg/dL (ref 8.4–10.5)
Chloride: 104 mEq/L (ref 96–112)
Creatinine, Ser: 0.8 mg/dL (ref 0.40–1.20)
GFR: 78.76 mL/min (ref >60.00)
Glucose, Bld: 138 mg/dL — ABNORMAL HIGH (ref 70–99)
Potassium: 4.1 mEq/L (ref 3.5–5.1)
Sodium: 140 mEq/L (ref 135–145)

## 2015-08-05 LAB — TSH: TSH: 0.01 u[IU]/mL — ABNORMAL LOW (ref 0.35–4.50)

## 2015-08-05 LAB — HEMOGLOBIN A1C: Hemoglobin A1C: 7.2

## 2015-08-05 MED ORDER — MELOXICAM 15 MG PO TABS: 15.0000 mg | ORAL_TABLET | Freq: Every day | ORAL | Status: DC | PRN

## 2015-08-05 MED ORDER — ALPRAZOLAM 0.5 MG PO TABS: ORAL_TABLET | ORAL | Status: DC

## 2015-08-05 MED ORDER — HYDROCODONE-ACETAMINOPHEN 5-325 MG PO TABS: 1.0000 | ORAL_TABLET | Freq: Four times a day (QID) | ORAL | Status: DC | PRN

## 2015-08-05 MED ORDER — BUPROPION HCL ER (XL) 300 MG PO TB24: 300.0000 mg | ORAL_TABLET | Freq: Every day | ORAL | Status: DC

## 2015-08-05 MED FILL — HYDROCODON-APAP 5-325: 5-325 | 30 days supply | Qty: 120 | Fill #0

## 2015-08-05 MED FILL — ALPRAZolam 0.5 MG TABS: 0.5 | 30 days supply | Qty: 120 | Fill #0

## 2015-08-05 NOTE — Progress Notes (Signed)
Subjective:  Patient ID: Tasha [name], female    DOB: 1960-07-13  Age: 55 y.o. MRN: [id number]  CC: Hypothyroidism; Diabetes; and Osteoarthritis   HPI Tasha [name] presents for follow-up on the above medical problems. She complains of nontraumatic left ankle and foot pain for about 3 months - she wants to have an x-ray done of the area. The pain is diffuse on the top of the foot in the front of the left ankle. She's not noticed any bruising or swelling. She gets symptom relief with her current pain medications. She also complains of chronic unchanged symptoms including fatigue, myalgias, joint aches including her knees, her wrists, her shoulders, and her ankles. She was recently seen elsewhere for hyperglycemia and started on metformin. She is tolerating it well with no nausea, vomiting, abdominal pain or cramping, diarrhea, or loss of appetite.  Outpatient Prescriptions Prior to Visit  Medication Sig Dispense Refill  . acyclovir ointment (ZOVIRAX) 5 % Apply 1 application topically every 3 (three) hours. 30 g 2  . Cholecalciferol 50000 UNITS TABS Take 1 tablet by mouth once a week. (Patient taking differently: Take 1 tablet by mouth every Monday. ) 12 tablet 3  . clobetasol ointment (TEMOVATE) [id number] % Apply 1 application topically 2 (two) times daily. 60 g 1  . cyclobenzaprine (FLEXERIL) 10 MG tablet Take 1 tablet (10 mg total) by mouth 2 (two) times daily as needed for muscle spasms. 20 tablet 0  . ezetimibe (ZETIA) 10 MG tablet Take 1 tablet (10 mg total) by mouth daily. 30 tablet 0  . metFORMIN (GLUCOPHAGE) 1000 MG tablet Take 1 tablet (1,000 mg total) by mouth 2 (two) times daily with a meal. 180 tablet 1  . omeprazole (PRILOSEC) 40 MG capsule Take 2 capsules (80 mg total) by mouth daily. 60 capsule 0  . sertraline (ZOLOFT) 100 MG tablet Take 1 tablet (100 mg total) by mouth at bedtime. 90 tablet 3  . simethicone (GAS-X) 80 MG chewable tablet Chew 80 mg by mouth every 6 (six) hours as needed  for flatulence.    . ALPRAZolam (XANAX) 0.5 MG tablet TAKE 1 TABLET BY MOUTH 4 TIMES A DAY AS NEEDED FOR ANXIETY 60 tablet 0  . buPROPion (WELLBUTRIN XL) 300 MG 24 hr tablet Take 1 tablet (300 mg total) by mouth daily. 30 tablet 0  . HYDROcodone-acetaminophen (NORCO/VICODIN) 5-325 MG tablet Take 1 tablet by mouth every 6 (six) hours as needed. Not to exceed 4 tablets a day 60 tablet 0  . levothyroxine (SYNTHROID, LEVOTHROID) 200 MCG tablet TAKE 1 TABLET BY MOUTH ONCE DAILY BEFORE BREAKFAST (TAKE IN ADDITION TO 50 MCG) 30 tablet 5  . meloxicam (MOBIC) 15 MG tablet Take 1 tablet (15 mg total) by mouth daily. (Patient taking differently: Take 15 mg by mouth daily as needed for pain. ) 30 tablet 6   No facility-administered medications prior to visit.    ROS Review of Systems  Constitutional: Positive for fatigue. Negative for chills, diaphoresis, appetite change and unexpected weight change.  HENT: Negative.  Negative for trouble swallowing.   Eyes: Negative.  Negative for visual disturbance.  Respiratory: Negative.  Negative for cough, choking, chest tightness, shortness of breath and stridor.   Cardiovascular: Negative.  Negative for chest pain, palpitations and leg swelling.  Gastrointestinal: Negative.  Negative for nausea, vomiting, abdominal pain, diarrhea, constipation and blood in stool.  Endocrine: Negative.  Negative for polydipsia, polyphagia and polyuria.  Musculoskeletal: Positive for myalgias and arthralgias. Negative for back  pain, joint swelling, neck pain and neck stiffness.  Skin: Negative.  Negative for rash.  Allergic/Immunologic: Negative.   Neurological: Negative.   Hematological: Negative.  Negative for adenopathy. Does not bruise/bleed easily.  Psychiatric/Behavioral: Positive for sleep disturbance and dysphoric mood. Negative for suicidal ideas, hallucinations, behavioral problems, confusion, self-injury and agitation. The patient is nervous/anxious. The patient is not  hyperactive.     Objective:  BP 122/80 mmHg  Pulse 66  Temp(Src) 97.9 F (36.6 C) (Oral)  Resp 16  Ht 5\' 7"  (1.702 m)  Wt 166 lb (75.297 kg)  BMI 25.99 kg/m2  SpO2 97%  BP Readings from Last 3 Encounters:  08/05/15 122/80  07/10/15 120/78  04/08/15 122/70    Wt Readings from Last 3 Encounters:  08/05/15 166 lb (75.297 kg)  07/10/15 166 lb (75.297 kg)  04/08/15 165 lb (74.844 kg)    Physical Exam  Constitutional: She is oriented to person, place, and time. No distress.  HENT:  Mouth/Throat: Oropharynx is clear and moist. No oropharyngeal exudate.  Eyes: Conjunctivae are normal. Right eye exhibits no discharge. Left eye exhibits no discharge. No scleral icterus.  Neck: Normal range of motion. Neck supple. No JVD present. No tracheal deviation present. No thyromegaly present.  Cardiovascular: Normal rate, regular rhythm, normal heart sounds and intact distal pulses.  Exam reveals no gallop and no friction rub.   No murmur heard. Pulmonary/Chest: Effort normal and breath sounds normal. No stridor. No respiratory distress. She has no wheezes. She has no rales. She exhibits no tenderness.  Abdominal: Soft. Bowel sounds are normal. She exhibits no distension and no mass. There is no tenderness. There is no rebound and no guarding.  Musculoskeletal: Normal range of motion. She exhibits no edema or tenderness.       Left ankle: Normal. She exhibits normal range of motion, no swelling, no ecchymosis, no deformity, no laceration and normal pulse. No tenderness. Achilles tendon normal.       Left foot: Normal. There is normal range of motion, no tenderness, no bony tenderness, no swelling, normal capillary refill, no crepitus, no deformity and no laceration.  Lymphadenopathy:    She has no cervical adenopathy.  Neurological: She is oriented to person, place, and time.  Skin: Skin is warm and dry. No rash noted. She is not diaphoretic. No erythema. No pallor.  Psychiatric: She has a  normal mood and affect. Her behavior is normal. Judgment and thought content normal. Her mood appears not anxious. Her affect is not inappropriate. Her speech is not rapid and/or pressured, not delayed and not tangential. She is not agitated and not slowed. She does not exhibit a depressed mood. She expresses no homicidal and no suicidal ideation. She expresses no suicidal plans and no homicidal plans. She is attentive.  Vitals reviewed.   Lab Results  Component Value Date   WBC 9.7 03/26/2015   HGB 13.5 03/26/2015   HCT 41.1 03/26/2015   PLT 174 03/26/2015   GLUCOSE 138* 08/05/2015   CHOL 200 04/08/2015   TRIG 179.0* 04/08/2015   HDL 36.00* 04/08/2015   LDLDIRECT 158.0 12/01/2013   LDLCALC 128* 04/08/2015   ALT 34 05/27/2014   AST 34 05/27/2014   NA 140 08/05/2015   K 4.1 08/05/2015   CL 104 08/05/2015   CREATININE 0.80 08/05/2015   BUN 13 08/05/2015   CO2 29 08/05/2015   TSH 0.01* 08/05/2015   HGBA1C 7.2 08/05/2015   MICROALBUR 0.7 04/08/2015    Dg Chest 2 View  03/26/2015  CLINICAL DATA:  Left chest pain and pressure, left arm weakness and shortness of breath since 5 p.m. today. Smoker. EXAM: CHEST  2 VIEW COMPARISON:  12/09/2014. FINDINGS: Normal sized heart. Clear lungs. Stable diffuse peribronchial thickening and accentuation of the interstitial markings. Thoracic spine degenerative changes. IMPRESSION: No acute abnormality.  Stable chronic bronchitic changes. Electronically Signed   By: Claudie Revering M.D.   On: 03/26/2015 19:06    Assessment & Plan:   [name] was seen today for hypothyroidism, diabetes and osteoarthritis.  Diagnoses and all orders for this visit:  Depression with anxiety -     Discontinue: ALPRAZolam (XANAX) 0.5 MG tablet; TAKE 1 TABLET BY MOUTH 4 TIMES A DAY AS NEEDED FOR ANXIETY -     buPROPion (WELLBUTRIN XL) 300 MG 24 hr tablet; Take 1 tablet (300 mg total) by mouth daily. -     Discontinue: ALPRAZolam (XANAX) 0.5 MG tablet; TAKE 1 TABLET BY MOUTH 4  TIMES A DAY AS NEEDED FOR ANXIETY -     Discontinue: ALPRAZolam (XANAX) 0.5 MG tablet; TAKE 1 TABLET BY MOUTH 4 TIMES A DAY AS NEEDED FOR ANXIETY -     ALPRAZolam (XANAX) 0.5 MG tablet; TAKE 1 TABLET BY MOUTH 4 TIMES A DAY AS NEEDED FOR ANXIETY  Myalgia and myositis -     meloxicam (MOBIC) 15 MG tablet; Take 1 tablet (15 mg total) by mouth daily as needed for pain. -     Discontinue: HYDROcodone-acetaminophen (NORCO/VICODIN) 5-325 MG tablet; Take 1 tablet by mouth every 6 (six) hours as needed. Not to exceed 4 tablets a day -     Discontinue: HYDROcodone-acetaminophen (NORCO/VICODIN) 5-325 MG tablet; Take 1 tablet by mouth every 6 (six) hours as needed. Not to exceed 4 tablets a day -     Discontinue: HYDROcodone-acetaminophen (NORCO/VICODIN) 5-325 MG tablet; Take 1 tablet by mouth every 6 (six) hours as needed. Not to exceed 4 tablets a day -     HYDROcodone-acetaminophen (NORCO/VICODIN) 5-325 MG tablet; Take 1 tablet by mouth every 6 (six) hours as needed. Not to exceed 4 tablets a day  Primary osteoarthritis of both knees -     Discontinue: HYDROcodone-acetaminophen (NORCO/VICODIN) 5-325 MG tablet; Take 1 tablet by mouth every 6 (six) hours as needed. Not to exceed 4 tablets a day -     Discontinue: HYDROcodone-acetaminophen (NORCO/VICODIN) 5-325 MG tablet; Take 1 tablet by mouth every 6 (six) hours as needed. Not to exceed 4 tablets a day -     Discontinue: HYDROcodone-acetaminophen (NORCO/VICODIN) 5-325 MG tablet; Take 1 tablet by mouth every 6 (six) hours as needed. Not to exceed 4 tablets a day -     HYDROcodone-acetaminophen (NORCO/VICODIN) 5-325 MG tablet; Take 1 tablet by mouth every 6 (six) hours as needed. Not to exceed 4 tablets a day  Other specified hypothyroidism- her TSH remains suppressed so I have lowered her levothyroxine dose again -     TSH; Future -     levothyroxine (SYNTHROID, LEVOTHROID) 175 MCG tablet; Take 1 tablet (175 mcg total) by mouth daily before  breakfast.  Type 2 diabetes mellitus with complication, without long-term current use of insulin ([place])- her A1c is down to 7.2%, her blood sugars are adequately well controlled, she agrees to work on her lifestyle modifications with diet/exercise/weight loss. -     Basic metabolic panel; Future  Foot pain, left- plain film only shows a heel spur and plantar fasciitis but this does not fit with her complaint, she may  have a mild form of DJD, will continue the current medications for pain relief. -     DG Foot Complete Left; Future   I have discontinued Ms. Henneman's levothyroxine. I have also changed her meloxicam. Additionally, I am having her start on levothyroxine. Lastly, I am having her maintain her Cholecalciferol, cyclobenzaprine, simethicone, omeprazole, ezetimibe, metFORMIN, acyclovir ointment, sertraline, clobetasol ointment, buPROPion, HYDROcodone-acetaminophen, and ALPRAZolam.  Meds ordered this encounter  Medications  . DISCONTD: ALPRAZolam (XANAX) 0.5 MG tablet    Sig: TAKE 1 TABLET BY MOUTH 4 TIMES A DAY AS NEEDED FOR ANXIETY    Dispense:  120 tablet    Refill:  3  . meloxicam (MOBIC) 15 MG tablet    Sig: Take 1 tablet (15 mg total) by mouth daily as needed for pain.    Dispense:  30 tablet    Refill:  6  . DISCONTD: HYDROcodone-acetaminophen (NORCO/VICODIN) 5-325 MG tablet    Sig: Take 1 tablet by mouth every 6 (six) hours as needed. Not to exceed 4 tablets a day    Dispense:  120 tablet    Refill:  0  . buPROPion (WELLBUTRIN XL) 300 MG 24 hr tablet    Sig: Take 1 tablet (300 mg total) by mouth daily.    Dispense:  30 tablet    Refill:  0  . DISCONTD: HYDROcodone-acetaminophen (NORCO/VICODIN) 5-325 MG tablet    Sig: Take 1 tablet by mouth every 6 (six) hours as needed. Not to exceed 4 tablets a day    Dispense:  120 tablet    Refill:  0  . DISCONTD: ALPRAZolam (XANAX) 0.5 MG tablet    Sig: TAKE 1 TABLET BY MOUTH 4 TIMES A DAY AS NEEDED FOR ANXIETY    Dispense:  120  tablet    Refill:  3  . DISCONTD: HYDROcodone-acetaminophen (NORCO/VICODIN) 5-325 MG tablet    Sig: Take 1 tablet by mouth every 6 (six) hours as needed. Not to exceed 4 tablets a day    Dispense:  120 tablet    Refill:  0  . DISCONTD: ALPRAZolam (XANAX) 0.5 MG tablet    Sig: TAKE 1 TABLET BY MOUTH 4 TIMES A DAY AS NEEDED FOR ANXIETY    Dispense:  120 tablet    Refill:  3  . HYDROcodone-acetaminophen (NORCO/VICODIN) 5-325 MG tablet    Sig: Take 1 tablet by mouth every 6 (six) hours as needed. Not to exceed 4 tablets a day    Dispense:  120 tablet    Refill:  0  . ALPRAZolam (XANAX) 0.5 MG tablet    Sig: TAKE 1 TABLET BY MOUTH 4 TIMES A DAY AS NEEDED FOR ANXIETY    Dispense:  120 tablet    Refill:  3  . levothyroxine (SYNTHROID, LEVOTHROID) 175 MCG tablet    Sig: Take 1 tablet (175 mcg total) by mouth daily before breakfast.    Dispense:  30 tablet    Refill:  3     Follow-up: Return in about 4 months (around 12/05/2015).  [name], MD

## 2015-08-05 NOTE — Patient Instructions (Signed)

## 2015-08-05 NOTE — Progress Notes (Signed)
Pre visit review using our clinic review tool, if applicable. No additional management support is needed unless otherwise documented below in the visit note. 

## 2015-08-06 ENCOUNTER — Encounter: Payer: Self-pay | Admitting: Internal Medicine

## 2015-08-06 MED ORDER — LEVOTHYROXINE SODIUM 175 MCG PO TABS: 175.0000 ug | ORAL_TABLET | Freq: Every day | ORAL | Status: DC

## 2015-08-11 ENCOUNTER — Ambulatory Visit: Payer: Managed Care, Other (non HMO) | Admitting: Internal Medicine

## 2015-09-07 MED FILL — LEVOTHYROXINE 200 MCG TAB: 200 | 30 days supply | Qty: 30 | Fill #3

## 2015-09-07 MED FILL — OMEPRAZOLE DR 40 MG CAPSULE: 40 | 30 days supply | Qty: 60 | Fill #0

## 2015-09-07 MED FILL — metFORMIN HCL 1000 MG TABS: 1000 | 30 days supply | Qty: 60 | Fill #3

## 2015-09-07 MED FILL — MELOXICAM 15 MG TABLET: 15 | 30 days supply | Qty: 30 | Fill #3

## 2015-09-07 MED FILL — BUPROPION HCL XL 300 MG TAB: 300 | 30 days supply | Qty: 30 | Fill #0

## 2015-09-07 MED FILL — ALPRAZolam 0.5 MG TABS: 0.5 | 30 days supply | Qty: 120 | Fill #1

## 2015-09-07 MED FILL — SERTRALINE HCL 100 MG TAB: 100 | 30 days supply | Qty: 30 | Fill #3

## 2015-09-13 ENCOUNTER — Other Ambulatory Visit: Payer: Managed Care, Other (non HMO)

## 2015-09-13 ENCOUNTER — Ambulatory Visit: Payer: Managed Care, Other (non HMO)

## 2015-10-12 ENCOUNTER — Encounter: Payer: Self-pay | Admitting: Internal Medicine

## 2015-10-12 ENCOUNTER — Other Ambulatory Visit: Payer: Self-pay | Admitting: Internal Medicine

## 2015-10-12 ENCOUNTER — Telehealth: Payer: Self-pay | Admitting: Internal Medicine

## 2015-10-12 ENCOUNTER — Ambulatory Visit (INDEPENDENT_AMBULATORY_CARE_PROVIDER_SITE_OTHER): Payer: Managed Care, Other (non HMO) | Admitting: Internal Medicine

## 2015-10-12 ENCOUNTER — Other Ambulatory Visit (INDEPENDENT_AMBULATORY_CARE_PROVIDER_SITE_OTHER): Payer: Managed Care, Other (non HMO)

## 2015-10-12 VITALS — BP 120/82 | HR 79 | Temp 98.1°F | Wt 168.0 lb

## 2015-10-12 DIAGNOSIS — R55 Syncope and collapse: Secondary | ICD-10-CM | POA: Diagnosis not present

## 2015-10-12 DIAGNOSIS — R1013 Epigastric pain: Secondary | ICD-10-CM | POA: Insufficient documentation

## 2015-10-12 DIAGNOSIS — R112 Nausea with vomiting, unspecified: Secondary | ICD-10-CM | POA: Diagnosis not present

## 2015-10-12 DIAGNOSIS — K219 Gastro-esophageal reflux disease without esophagitis: Secondary | ICD-10-CM

## 2015-10-12 DIAGNOSIS — E118 Type 2 diabetes mellitus with unspecified complications: Secondary | ICD-10-CM | POA: Diagnosis not present

## 2015-10-12 DIAGNOSIS — E119 Type 2 diabetes mellitus without complications: Secondary | ICD-10-CM

## 2015-10-12 LAB — URINALYSIS, ROUTINE W REFLEX MICROSCOPIC
Hgb urine dipstick: NEGATIVE
Ketones, ur: NEGATIVE
Nitrite: NEGATIVE
Specific Gravity, Urine: >=1.03 — AB (ref 1.000–1.030)
Total Protein, Urine: NEGATIVE
Urine Glucose: NEGATIVE
Urobilinogen, UA: 0.2 (ref 0.0–1.0)
pH: 5.5 (ref 5.0–8.0)

## 2015-10-12 LAB — CBC WITH DIFFERENTIAL/PLATELET
Basophils Absolute: 0 10*3/uL (ref 0.0–0.1)
Basophils Relative: 0.3 % (ref 0.0–3.0)
Eosinophils Absolute: 0 10*3/uL (ref 0.0–0.7)
Eosinophils Relative: 0 % (ref 0.0–5.0)
HCT: 44 % (ref 36.0–46.0)
Hemoglobin: 15 g/dL (ref 12.0–15.0)
Lymphocytes Relative: 37.2 % (ref 12.0–46.0)
Lymphs Abs: 4.6 10*3/uL — ABNORMAL HIGH (ref 0.7–4.0)
MCHC: 34.1 g/dL (ref 30.0–36.0)
MCV: 92 fl (ref 78.0–100.0)
Monocytes Absolute: 0.6 10*3/uL (ref 0.1–1.0)
Monocytes Relative: 4.6 % (ref 3.0–12.0)
Neutro Abs: 7.1 10*3/uL (ref 1.4–7.7)
Neutrophils Relative %: 57.9 % (ref 43.0–77.0)
Platelets: 200 10*3/uL (ref 150.0–400.0)
RBC: 4.79 Mil/uL (ref 3.87–5.11)
RDW: 12.9 % (ref 11.5–15.5)
WBC: 12.3 10*3/uL — ABNORMAL HIGH (ref 4.0–10.5)

## 2015-10-12 LAB — GLUCOSE, POCT (MANUAL RESULT ENTRY): POC Glucose: 166 mg/dl — AB (ref 70–99)

## 2015-10-12 MED ORDER — REPAGLINIDE 1 MG PO TABS: 1.0000 mg | ORAL_TABLET | Freq: Three times a day (TID) | ORAL | 5 refills | Status: DC

## 2015-10-12 MED ORDER — LANCETS MISC: 0 refills | Status: DC

## 2015-10-12 MED ORDER — BLOOD GLUCOSE MONITOR KIT: PACK | 0 refills | Status: DC

## 2015-10-12 MED ORDER — GLUCOSE BLOOD VI STRP: ORAL_STRIP | 0 refills | Status: DC

## 2015-10-12 MED ORDER — ONDANSETRON HCL 4 MG PO TABS: 4.0000 mg | ORAL_TABLET | Freq: Three times a day (TID) | ORAL | 0 refills | Status: DC | PRN

## 2015-10-12 MED FILL — LEVOTHYROXINE 200 MCG TAB: 200 | 30 days supply | Qty: 30 | Fill #4

## 2015-10-12 MED FILL — ALPRAZolam 0.5 MG TABS: 0.5 | 30 days supply | Qty: 120 | Fill #2

## 2015-10-12 MED FILL — MELOXICAM 15 MG TABLET: 15 | 30 days supply | Qty: 30 | Fill #4

## 2015-10-12 MED FILL — OMEPRAZOLE DR 40 MG CAPSULE: 40 | 30 days supply | Qty: 60 | Fill #0

## 2015-10-12 MED FILL — ONE TOUCH DELICA 33G LANCET: 30 days supply | Qty: 100 | Fill #0

## 2015-10-12 MED FILL — BUPROPION HCL XL 300 MG TAB: 300 | 30 days supply | Qty: 30 | Fill #0

## 2015-10-12 MED FILL — ONE TOUCH ULTRA TEST STRIPS: 25 days supply | Qty: 50 | Fill #0

## 2015-10-12 MED FILL — REPAGLINIDE 1 MG TABLET: 1 | 30 days supply | Qty: 90 | Fill #0

## 2015-10-12 MED FILL — SERTRALINE HCL 100 MG TAB: 100 | 30 days supply | Qty: 30 | Fill #4

## 2015-10-12 MED FILL — metFORMIN HCL 1000 MG TABS: 1000 | 30 days supply | Qty: 60 | Fill #4

## 2015-10-12 MED FILL — ONDANSETRON HCL 4 MG TABLET: 4 | 6 days supply | Qty: 20 | Fill #0

## 2015-10-12 NOTE — Patient Instructions (Signed)
Do not take Metformin   Dehydration, Adult Dehydration is a condition in which you do not have enough fluid or water in your body. It happens when you take in less fluid than you lose. Vital organs such as the kidneys, brain, and heart cannot function without a proper amount of fluids. Any loss of fluids from the body can cause dehydration.  Dehydration can range from mild to severe. This condition should be treated right away to help prevent it from becoming severe. CAUSES  This condition may be caused by:  Vomiting.  Diarrhea.  Excessive sweating, such as when exercising in hot or humid weather.  Not drinking enough fluid during strenuous exercise or during an illness.  Excessive urine output.  Fever.  Certain medicines. RISK FACTORS This condition is more likely to develop in:  People who are taking certain medicines that cause the body to lose excess fluid (diuretics).   People who have a chronic illness, such as diabetes, that may increase urination.  Older adults.   People who live at high altitudes.   People who participate in endurance sports.  SYMPTOMS  Mild Dehydration  Thirst.  Dry lips.  Slightly dry mouth.  Dry, warm skin. Moderate Dehydration  Very dry mouth.   Muscle cramps.   Dark urine and decreased urine production.   Decreased tear production.   Headache.   Light-headedness, especially when you stand up from a sitting position.  Severe Dehydration  Changes in skin.   Cold and clammy skin.   Skin does not spring back quickly when lightly pinched and released.   Changes in body fluids.   Extreme thirst.   No tears.   Not able to sweat when body temperature is high, such as in hot weather.   Minimal urine production.   Changes in vital signs.   Rapid, weak pulse (more than 100 beats per minute when you are sitting still).   Rapid breathing.   Low blood pressure.   Other changes.   Sunken eyes.    Cold hands and feet.   Confusion.  Lethargy and difficulty being awakened.  Fainting (syncope).   Short-term weight loss.   Unconsciousness. DIAGNOSIS  This condition may be diagnosed based on your symptoms. You may also have tests to determine how severe your dehydration is. These tests may include:   Urine tests.   Blood tests.  TREATMENT  Treatment for this condition depends on the severity. Mild or moderate dehydration can often be treated at home. Treatment should be started right away. Do not wait until dehydration becomes severe. Severe dehydration needs to be treated at the hospital. Treatment for Mild Dehydration  Drinking plenty of water to replace the fluid you have lost.   Replacing minerals in your blood (electrolytes) that you may have lost.  Treatment for Moderate Dehydration  Consuming oral rehydration solution (ORS). Treatment for Severe Dehydration  Receiving fluid through an IV tube.   Receiving electrolyte solution through a feeding tube that is passed through your nose and into your stomach (nasogastric tube or NG tube).  Correcting any abnormalities in electrolytes. HOME CARE INSTRUCTIONS   Drink enough fluid to keep your urine clear or pale yellow.   Drink water or fluid slowly by taking small sips. You can also try sucking on ice cubes.  Have food or beverages that contain electrolytes. Examples include bananas and sports drinks.  Take over-the-counter and prescription medicines only as told by your health care provider.   Prepare ORS according  to the manufacturer's instructions. Take sips of ORS every 5 minutes until your urine returns to normal.  If you have vomiting or diarrhea, continue to try to drink water, ORS, or both.   If you have diarrhea, avoid:   Beverages that contain caffeine.   Fruit juice.   Milk.   Carbonated soft drinks.  Do not take salt tablets. This can lead to the condition of having too  much sodium in your body (hypernatremia).  SEEK MEDICAL CARE IF:  You cannot eat or drink without vomiting.  You have had moderate diarrhea during a period of more than 24 hours.  You have a fever. SEEK IMMEDIATE MEDICAL CARE IF:   You have extreme thirst.  You have severe diarrhea.  You have not urinated in 6-8 hours, or you have urinated only a small amount of very dark urine.  You have shriveled skin.  You are dizzy, confused, or both.   This information is not intended to replace advice given to you by your health care provider. Make sure you discuss any questions you have with your health care provider.   Document Released: 01/23/2005 Document Revised: 10/14/2014 Document Reviewed: 06/10/2014 Elsevier Interactive Patient Education [employer].

## 2015-10-12 NOTE — Assessment & Plan Note (Signed)
?  etiology Labs incl Cortisol EKG Hold Metformin, Start Prandin Cont Omeprazole

## 2015-10-12 NOTE — Assessment & Plan Note (Signed)
9/17 ?etiology - GI illness vs other Labs incl Cortisol EKG

## 2015-10-12 NOTE — Assessment & Plan Note (Signed)
[facility] 166 Labs incl Cortisol Hold Metformin, Start Prandin F/u w/Dr Ronnald Ramp

## 2015-10-12 NOTE — Telephone Encounter (Signed)
Pt called and said that blood sugar is high and would like nurse to call her. Told she may need to be seen but wanted nurse to call 1st

## 2015-10-12 NOTE — Progress Notes (Signed)
Pre visit review using our clinic review tool, if applicable. No additional management support is needed unless otherwise documented below in the visit note. 

## 2015-10-12 NOTE — Progress Notes (Signed)
Subjective:  Patient ID: Tasha [name], female    DOB: 03-May-1960  Age: 55 y.o. MRN: [id number]  CC: Hyperglycemia; Abdominal Pain (since starting Metformin approximately 6 mo ago); and Diarrhea (x 5 days)   HPI BRYTTANI BLEW presents for diarrhea since Fri 6-7/d - better now. The cramping started on Sat. CBG was 270 at home. C/o dizziness. Loose stools on Metformin since Feb 2017. No ETOH. Not taking Zetia  Outpatient Medications Prior to Visit  Medication Sig Dispense Refill  . acyclovir ointment (ZOVIRAX) 5 % Apply 1 application topically every 3 (three) hours. 30 g 2  . ALPRAZolam (XANAX) 0.5 MG tablet TAKE 1 TABLET BY MOUTH 4 TIMES A DAY AS NEEDED FOR ANXIETY 120 tablet 3  . blood glucose meter kit and supplies KIT Use to test blood sugar up to twice a day. Dx E11.9 1 each 0  . buPROPion (WELLBUTRIN XL) 300 MG 24 hr tablet Take 1 tablet (300 mg total) by mouth daily. 30 tablet 0  . Cholecalciferol 50000 UNITS TABS Take 1 tablet by mouth once a week. (Patient taking differently: Take 1 tablet by mouth every Monday. ) 12 tablet 3  . clobetasol ointment (TEMOVATE) 5.57 % Apply 1 application topically 2 (two) times daily. 60 g 1  . cyclobenzaprine (FLEXERIL) 10 MG tablet Take 1 tablet (10 mg total) by mouth 2 (two) times daily as needed for muscle spasms. 20 tablet 0  . ezetimibe (ZETIA) 10 MG tablet Take 1 tablet (10 mg total) by mouth daily. 30 tablet 0  . glucose blood test strip Use to test blood sugar up to twice a day. Dx E11.9 200 each 0  . HYDROcodone-acetaminophen (NORCO/VICODIN) 5-325 MG tablet Take 1 tablet by mouth every 6 (six) hours as needed. Not to exceed 4 tablets a day 120 tablet 0  . Lancets MISC Use to test blood sugar up to twice a day. Dx E11.9 200 each 0  . meloxicam (MOBIC) 15 MG tablet Take 1 tablet (15 mg total) by mouth daily as needed for pain. 30 tablet 6  . metFORMIN (GLUCOPHAGE) 1000 MG tablet Take 1 tablet (1,000 mg total) by mouth 2 (two) times daily with a  meal. 180 tablet 1  . omeprazole (PRILOSEC) 40 MG capsule TAKE 2 CAPSULES BY MOUTH DAILY 60 capsule 11  . sertraline (ZOLOFT) 100 MG tablet Take 1 tablet (100 mg total) by mouth at bedtime. 90 tablet 3  . simethicone (GAS-X) 80 MG chewable tablet Chew 80 mg by mouth every 6 (six) hours as needed for flatulence.    Marland Kitchen levothyroxine (SYNTHROID, LEVOTHROID) 175 MCG tablet Take 1 tablet (175 mcg total) by mouth daily before breakfast. 30 tablet 3   No facility-administered medications prior to visit.     ROS Review of Systems  Constitutional: Positive for fatigue. Negative for activity change, appetite change, chills and unexpected weight change.  HENT: Negative for congestion, mouth sores and sinus pressure.   Eyes: Negative for visual disturbance.  Respiratory: Negative for cough and chest tightness.   Gastrointestinal: Positive for abdominal pain, diarrhea and nausea.  Genitourinary: Negative for difficulty urinating, frequency and vaginal pain.  Musculoskeletal: Negative for back pain and gait problem.  Skin: Negative for pallor and rash.  Neurological: Positive for weakness. Negative for dizziness, tremors, numbness and headaches.  Psychiatric/Behavioral: Negative for confusion and sleep disturbance.    Objective:  BP 120/80   Pulse 79   Temp 98.1 F (36.7 C) (Oral)   Wt 168  lb (76.2 kg)   SpO2 96%   BMI 26.31 kg/m   BP Readings from Last 3 Encounters:  10/12/15 120/80  08/05/15 122/80  07/10/15 120/78    Wt Readings from Last 3 Encounters:  10/12/15 168 lb (76.2 kg)  08/05/15 166 lb (75.3 kg)  07/10/15 166 lb (75.3 kg)    Physical Exam  Constitutional: She appears well-developed. No distress.  HENT:  Head: Normocephalic.  Right Ear: External ear normal.  Left Ear: External ear normal.  Nose: Nose normal.  Mouth/Throat: Oropharynx is clear and moist.  Eyes: Conjunctivae are normal. Pupils are equal, round, and reactive to light. Right eye exhibits no discharge.  Left eye exhibits no discharge.  Neck: Normal range of motion. Neck supple. No JVD present. No tracheal deviation present. No thyromegaly present.  Cardiovascular: Normal rate, regular rhythm and normal heart sounds.   Pulmonary/Chest: No stridor. No respiratory distress. She has no wheezes.  Abdominal: Soft. Bowel sounds are normal. She exhibits no distension and no mass. There is tenderness. There is no rebound and no guarding.  Musculoskeletal: She exhibits no edema or tenderness.  Lymphadenopathy:    She has no cervical adenopathy.  Neurological: She displays normal reflexes. No cranial nerve deficit. She exhibits normal muscle tone.  Skin: No rash noted. No erythema.  Psychiatric: She has a normal mood and affect. Her behavior is normal. Judgment and thought content normal.  Pt got lightheaded when she lied down Tanned skin HEENT moist Tender epig area   Procedure: EKG Indication: near-syncope Impression: NSR. No acute changes.  Lab Results  Component Value Date   WBC 9.7 03/26/2015   HGB 13.5 03/26/2015   HCT 41.1 03/26/2015   PLT 174 03/26/2015   GLUCOSE 138 (H) 08/05/2015   CHOL 200 04/08/2015   TRIG 179.0 (H) 04/08/2015   HDL 36.00 (L) 04/08/2015   LDLDIRECT 158.0 12/01/2013   LDLCALC 128 (H) 04/08/2015   ALT 34 05/27/2014   AST 34 05/27/2014   NA 140 08/05/2015   K 4.1 08/05/2015   CL 104 08/05/2015   CREATININE 0.80 08/05/2015   BUN 13 08/05/2015   CO2 29 08/05/2015   TSH 0.01 (L) 08/05/2015   HGBA1C 7.2 08/05/2015   MICROALBUR 0.7 04/08/2015    Dg Foot Complete Left  Result Date: 08/05/2015 CLINICAL DATA:  Chronic left foot pain.  No known injury. EXAM: LEFT FOOT - COMPLETE 3+ VIEW COMPARISON:  None. FINDINGS: There is no evidence of acute fracture, subluxation or dislocation. Talar beaking is identified. Plantar fascia calcification and calcaneal spur noted. Mild degenerative changes in the midfoot noted. No other focal bony abnormalities are identified.  IMPRESSION: No evidence of acute abnormality. Calcaneal spur and plantar fascia calcification. Electronically Signed   By: Margarette Canada M.D.   On: 08/05/2015 15:32    Assessment & Plan:   [name] was seen today for hyperglycemia, abdominal pain and diarrhea.  Diagnoses and all orders for this visit:  Type 2 diabetes mellitus with complication, without long-term current use of insulin (HCC) -     POCT glucose (manual entry)   I am having Ms. Lichtenwalner maintain her Cholecalciferol, cyclobenzaprine, simethicone, ezetimibe, metFORMIN, acyclovir ointment, sertraline, clobetasol ointment, meloxicam, buPROPion, HYDROcodone-acetaminophen, ALPRAZolam, omeprazole, blood glucose meter kit and supplies, glucose blood, Lancets, and levothyroxine.  Meds ordered this encounter  Medications  . levothyroxine (SYNTHROID, LEVOTHROID) 200 MCG tablet    Sig: Take 1 tablet by mouth daily.     Follow-up: No Follow-up on file.  Alex  Dayonna Selbe, MD

## 2015-10-12 NOTE — Telephone Encounter (Signed)
Since Saturday she has experienced high blood sugar.   270, 249, 245, 22  First stated that BS was after eating and then states that it was before eating. Pt scheduled to see MD today.

## 2015-10-13 ENCOUNTER — Emergency Department (HOSPITAL_COMMUNITY): Payer: Managed Care, Other (non HMO)

## 2015-10-13 ENCOUNTER — Encounter (HOSPITAL_COMMUNITY): Payer: Self-pay | Admitting: Emergency Medicine

## 2015-10-13 ENCOUNTER — Emergency Department (HOSPITAL_COMMUNITY)
Admission: EM | Admit: 2015-10-13 | Discharge: 2015-10-13 | Disposition: A | Discharge status: Home / Self Care | Payer: Managed Care, Other (non HMO) | Attending: Physician Assistant | Admitting: Physician Assistant

## 2015-10-13 DIAGNOSIS — E039 Hypothyroidism, unspecified: Secondary | ICD-10-CM | POA: Diagnosis not present

## 2015-10-13 DIAGNOSIS — R1013 Epigastric pain: Secondary | ICD-10-CM

## 2015-10-13 DIAGNOSIS — K5900 Constipation, unspecified: Secondary | ICD-10-CM

## 2015-10-13 DIAGNOSIS — R1011 Right upper quadrant pain: Secondary | ICD-10-CM

## 2015-10-13 DIAGNOSIS — Z7984 Long term (current) use of oral hypoglycemic drugs: Secondary | ICD-10-CM | POA: Diagnosis not present

## 2015-10-13 DIAGNOSIS — R1084 Generalized abdominal pain: Secondary | ICD-10-CM | POA: Diagnosis present

## 2015-10-13 DIAGNOSIS — F17210 Nicotine dependence, cigarettes, uncomplicated: Secondary | ICD-10-CM | POA: Diagnosis not present

## 2015-10-13 LAB — HEPATIC FUNCTION PANEL
ALT: 64 U/L — ABNORMAL HIGH (ref 0–35)
AST: 46 U/L — ABNORMAL HIGH (ref 0–37)
Albumin: 4.5 g/dL (ref 3.5–5.2)
Alkaline Phosphatase: 94 U/L (ref 39–117)
Bilirubin, Direct: 0.1 mg/dL (ref 0.0–0.3)
Total Bilirubin: 0.3 mg/dL (ref 0.2–1.2)
Total Protein: 7.6 g/dL (ref 6.0–8.3)

## 2015-10-13 LAB — CBC
HCT: 44.7 % (ref 36.0–46.0)
Hemoglobin: 14.7 g/dL (ref 12.0–15.0)
MCH: 30.9 pg (ref 26.0–34.0)
MCHC: 32.9 g/dL (ref 30.0–36.0)
MCV: 94.1 fL (ref 78.0–100.0)
Platelets: 180 10*3/uL (ref 150–400)
RBC: 4.75 MIL/uL (ref 3.87–5.11)
RDW: 12.4 % (ref 11.5–15.5)
WBC: 10.1 10*3/uL (ref 4.0–10.5)

## 2015-10-13 LAB — COMPREHENSIVE METABOLIC PANEL
ALT: 65 U/L — ABNORMAL HIGH (ref 14–54)
AST: 45 U/L — ABNORMAL HIGH (ref 15–41)
Albumin: 4.2 g/dL (ref 3.5–5.0)
Alkaline Phosphatase: 97 U/L (ref 38–126)
Anion gap: 14 (ref 5–15)
BUN: 12 mg/dL (ref 6–20)
CO2: 23 mmol/L (ref 22–32)
Calcium: 9.1 mg/dL (ref 8.9–10.3)
Chloride: 96 mmol/L — ABNORMAL LOW (ref 101–111)
Creatinine, Ser: 0.78 mg/dL (ref 0.44–1.00)
GFR calc Af Amer: >60 mL/min (ref >60)
GFR calc non Af Amer: >60 mL/min (ref >60)
Glucose, Bld: 234 mg/dL — ABNORMAL HIGH (ref 65–99)
Potassium: 3.9 mmol/L (ref 3.5–5.1)
Sodium: 133 mmol/L — ABNORMAL LOW (ref 135–145)
Total Bilirubin: 0.4 mg/dL (ref 0.3–1.2)
Total Protein: 7.2 g/dL (ref 6.5–8.1)

## 2015-10-13 LAB — URINALYSIS, ROUTINE W REFLEX MICROSCOPIC
Bilirubin Urine: NEGATIVE
Glucose, UA: >1000 mg/dL — AB
Hgb urine dipstick: NEGATIVE
Ketones, ur: NEGATIVE mg/dL
Leukocytes, UA: NEGATIVE
Nitrite: NEGATIVE
Protein, ur: NEGATIVE mg/dL
Specific Gravity, Urine: 1.023 (ref 1.005–1.030)
pH: 5.5 (ref 5.0–8.0)

## 2015-10-13 LAB — URINE MICROSCOPIC-ADD ON: RBC / HPF: NONE SEEN RBC/hpf (ref 0–5)

## 2015-10-13 LAB — BASIC METABOLIC PANEL
BUN: 16 mg/dL (ref 6–23)
CO2: 22 mEq/L (ref 19–32)
Calcium: 9.6 mg/dL (ref 8.4–10.5)
Chloride: 101 mEq/L (ref 96–112)
Creatinine, Ser: 0.91 mg/dL (ref 0.40–1.20)
GFR: 67.83 mL/min (ref >60.00)
Glucose, Bld: 169 mg/dL — ABNORMAL HIGH (ref 70–99)
Potassium: 4 mEq/L (ref 3.5–5.1)
Sodium: 134 mEq/L — ABNORMAL LOW (ref 135–145)

## 2015-10-13 LAB — LIPASE, BLOOD: Lipase: 28 U/L (ref 11–51)

## 2015-10-13 LAB — I-STAT TROPONIN, ED: Troponin i, poc: 0 ng/mL (ref 0.00–0.08)

## 2015-10-13 LAB — CORTISOL: Cortisol, Plasma: 6.3 ug/dL

## 2015-10-13 LAB — LIPASE: Lipase: 33 U/L (ref 11.0–59.0)

## 2015-10-13 MED ORDER — POLYETHYLENE GLYCOL 3350 17 G PO PACK: 17.0000 g | PACK | Freq: Every day | ORAL | 0 refills | Status: DC

## 2015-10-13 MED ORDER — IOPAMIDOL (ISOVUE-300) INJECTION 61%
INTRAVENOUS | Status: AC
  Administered 2015-10-13: 100 mL via INTRAVENOUS
  Filled 2015-10-13: qty 100

## 2015-10-13 MED ORDER — PROMETHAZINE HCL 25 MG/ML IJ SOLN
25.0000 mg | Freq: Once | INTRAMUSCULAR | Status: AC
  Administered 2015-10-13: 25 mg via INTRAVENOUS
  Filled 2015-10-13: qty 1

## 2015-10-13 MED ORDER — SODIUM CHLORIDE 0.9 % IV BOLUS (SEPSIS)
1000.0000 mL | Freq: Once | INTRAVENOUS | Status: AC
  Administered 2015-10-13: 1000 mL via INTRAVENOUS

## 2015-10-13 MED ORDER — PANTOPRAZOLE SODIUM 40 MG PO TBEC
80.0000 mg | DELAYED_RELEASE_TABLET | Freq: Once | ORAL | Status: AC
  Administered 2015-10-13: 80 mg via ORAL
  Filled 2015-10-13: qty 2

## 2015-10-13 MED ORDER — GI COCKTAIL ~~LOC~~
30.0000 mL | Freq: Once | ORAL | Status: AC
  Administered 2015-10-13: 30 mL via ORAL
  Filled 2015-10-13: qty 30

## 2015-10-13 MED ORDER — MORPHINE SULFATE (PF) 4 MG/ML IV SOLN
4.0000 mg | Freq: Once | INTRAVENOUS | Status: AC
  Administered 2015-10-13: 4 mg via INTRAVENOUS
  Filled 2015-10-13: qty 1

## 2015-10-13 MED ORDER — IOPAMIDOL (ISOVUE-300) INJECTION 61%: 100.0000 mL | Freq: Once | INTRAVENOUS | Status: DC | PRN

## 2015-10-13 MED ORDER — DICYCLOMINE HCL 10 MG/ML IM SOLN
20.0000 mg | Freq: Once | INTRAMUSCULAR | Status: AC
  Administered 2015-10-13: 20 mg via INTRAMUSCULAR
  Filled 2015-10-13: qty 2

## 2015-10-13 MED ORDER — ONDANSETRON HCL 4 MG/2ML IJ SOLN
4.0000 mg | Freq: Once | INTRAMUSCULAR | Status: AC
  Administered 2015-10-13: 4 mg via INTRAVENOUS
  Filled 2015-10-13: qty 2

## 2015-10-13 MED FILL — POLYETHYLENE GLYCOL 3350: 15 days supply | Qty: 255 | Fill #0

## 2015-10-13 NOTE — ED Provider Notes (Signed)
TIME SEEN: 5:50 AM  CHIEF COMPLAINT: Abdominal pain, chest pain  HPI: Pt is a 55 y.o. female with history of depression, anxiety, conversion disorder, hyperlipidemia who presents to the emergency department with complaints of diffuse upper abdominal pain that radiates into her chest. Pain started on Saturday, 4 days ago. States pain started after drinking an alcoholic beverage at a party. States she had only 1 alcoholic beverage at that party. Patient is a very poor historian. She states she thinks it is a throbbing pain that may get better after eating. She has had nausea but no vomiting. She has had diarrhea but no bloody stools or melena. She denies dysuria, hematuria, vaginal bleeding or discharge but states that she has noted that her urine is dark. She also reports that occasionally she feel short of breath and is short of breath currently.  She's had a history of an abdominal lipoma removed, hysterectomy but no other abdominal surgery. Denies sick contacts or recent travel. Does have a history of acid reflux but states this feels different. Denies it as a burning pain. Denies any bitter or sour taste in her mouth. States she does drink occasionally. No regular NSAID use.  ROS: See HPI Constitutional: no fever  Eyes: no drainage  ENT: no runny nose   Cardiovascular:   chest pain  Resp:  SOB  GI: no vomiting GU: no dysuria Integumentary: no rash  Allergy: no hives  Musculoskeletal: no leg swelling  Neurological: no slurred speech ROS otherwise negative  PAST MEDICAL HISTORY/PAST SURGICAL HISTORY:  Past Medical History:  Diagnosis Date  . Anxiety state, unspecified   . Arthralgia of temporomandibular joint   . Conversion disorder   . Depressive disorder, not elsewhere classified   . Dysphagia, unspecified(787.20)   . Endometriosis   . Esophageal reflux   . Headache(784.0)   . Irritable bowel syndrome   . Mitral valve disorders   . Myalgia and myositis, unspecified   .  Overweight(278.02)   . Periodic limb movement disorder   . Pure hypercholesterolemia   . Unspecified hypothyroidism     MEDICATIONS:  Prior to Admission medications   Medication Sig Start Date End Date Taking? Authorizing Provider  acyclovir ointment (ZOVIRAX) 5 % Apply 1 application topically every 3 (three) hours. 04/26/15   Janith Lima, MD  ALPRAZolam Duanne Moron) 0.5 MG tablet TAKE 1 TABLET BY MOUTH 4 TIMES A DAY AS NEEDED FOR ANXIETY 08/05/15   Janith Lima, MD  blood glucose meter kit and supplies KIT Use to test blood sugar up to twice a day. Dx E11.9 10/12/15   Janith Lima, MD  buPROPion (WELLBUTRIN XL) 300 MG 24 hr tablet Take 1 tablet (300 mg total) by mouth daily. 08/05/15   Janith Lima, MD  Cholecalciferol 50000 UNITS TABS Take 1 tablet by mouth once a week. Patient taking differently: Take 1 tablet by mouth every Monday.  12/09/14   Janith Lima, MD  clobetasol ointment (TEMOVATE) 2.09 % Apply 1 application topically 2 (two) times daily. 05/31/15   Janith Lima, MD  cyclobenzaprine (FLEXERIL) 10 MG tablet Take 1 tablet (10 mg total) by mouth 2 (two) times daily as needed for muscle spasms. 12/09/14   Janith Lima, MD  ezetimibe (ZETIA) 10 MG tablet Take 1 tablet (10 mg total) by mouth daily. 04/02/15   Janith Lima, MD  glucose blood test strip Use to test blood sugar up to twice a day. Dx E11.9 10/12/15   Marcello Moores  Evalina Field, MD  HYDROcodone-acetaminophen (NORCO/VICODIN) 5-325 MG tablet Take 1 tablet by mouth every 6 (six) hours as needed. Not to exceed 4 tablets a day 08/05/15   Janith Lima, MD  Lancets MISC Use to test blood sugar up to twice a day. Dx E11.9 10/12/15   Janith Lima, MD  levothyroxine (SYNTHROID, LEVOTHROID) 200 MCG tablet Take 1 tablet by mouth daily. 10/12/15   Historical Provider, MD  meloxicam (MOBIC) 15 MG tablet Take 1 tablet (15 mg total) by mouth daily as needed for pain. 08/05/15   Janith Lima, MD  metFORMIN (GLUCOPHAGE) 1000 MG tablet Take 1 tablet  (1,000 mg total) by mouth 2 (two) times daily with a meal. 04/11/15   Janith Lima, MD  omeprazole (PRILOSEC) 40 MG capsule TAKE 2 CAPSULES BY MOUTH DAILY 10/12/15   Janith Lima, MD  ondansetron (ZOFRAN) 4 MG tablet Take 1 tablet (4 mg total) by mouth every 8 (eight) hours as needed for nausea or vomiting. 10/12/15   Cassandria Anger, MD  repaglinide (PRANDIN) 1 MG tablet Take 1 tablet (1 mg total) by mouth 3 (three) times daily before meals. 10/12/15   Evie Lacks Plotnikov, MD  sertraline (ZOLOFT) 100 MG tablet Take 1 tablet (100 mg total) by mouth at bedtime. 05/31/15   Janith Lima, MD  simethicone (GAS-X) 80 MG chewable tablet Chew 80 mg by mouth every 6 (six) hours as needed for flatulence.    Historical Provider, MD    ALLERGIES:  Allergies  Allergen Reactions  . Statins Other (See Comments)    Muscle weakness/pains  . Crestor [Rosuvastatin] Other (See Comments)    Muscle aches  . Lipitor [Atorvastatin] Other (See Comments)    Muscle aches  . Zocor [Simvastatin] Other (See Comments)    Leg pain per patient/PE    SOCIAL HISTORY:  Social History  Substance Use Topics  . Smoking status: Current Every Day Smoker    Packs/day: 1.00    Types: Cigarettes  . Smokeless tobacco: Never Used  . Alcohol use No    FAMILY HISTORY: Family History  Problem Relation Age of Onset  . Heart disease Mother   . Heart disease Father   . Diabetes Father   . Breast cancer Maternal Aunt   . Colon cancer Paternal Aunt     EXAM: BP 150/73 (BP Location: Left Arm)   Pulse 67   Temp 98.1 F (36.7 C) (Oral)   Resp 16   Ht 5' 7"  (1.702 m)   Wt 168 lb (76.2 kg)   SpO2 97%   BMI 26.31 kg/m  CONSTITUTIONAL: Alert and oriented and responds appropriately to questions. Well-appearing; well-nourished, Appears uncomfortable but is afebrile and nontoxic HEAD: Normocephalic EYES: Conjunctivae clear, PERRL ENT: normal nose; no rhinorrhea; moist mucous membranes NECK: Supple, no meningismus, no LAD   CARD: RRR; S1 and S2 appreciated; no murmurs, no clicks, no rubs, no gallops RESP: Normal chest excursion without splinting or tachypnea; breath sounds clear and equal bilaterally; no wheezes, no rhonchi, no rales, no hypoxia or respiratory distress, speaking full sentences ABD/GI: Normal bowel sounds; non-distended; soft, to palpation in the epigastric region and right upper quadrant with voluntary guarding, negative Murphy sign, no peritoneal signs, no tenderness at McBurney's point BACK:  The back appears normal and is non-tender to palpation, there is no CVA tenderness EXT: Normal ROM in all joints; non-tender to palpation; no edema; normal capillary refill; no cyanosis, no calf tenderness or swelling  SKIN: Normal color for age and race; warm; no rash NEURO: Moves all extremities equally, sensation to light touch intact diffusely, cranial nerves II through XII intact PSYCH: The patient's mood and manner are appropriate. Grooming and personal hygiene are appropriate.  MEDICAL DECISION MAKING: Patient here with abdominal pain that radiates into her chest. Differential diagnosis includes cholelithiasis, cholecystitis, pancreatitis, gastritis, peptic ulcer disease. Less likely ACS. EKG shows no ischemic abnormality. We'll obtain labs, acute abdominal series, right upper quadrant ultrasound. We'll give IV fluids, pain and nausea medicine.  ED PROGRESS: 8:00 AM  Patient's labs show mild elevation of her AST and ALT. Lipase is normal. No leukocytosis. Troponin is negative.  Urine shows no sign of infection.  X-ray shows moderate amount of stool but no free air or bowel obstruction. Right upper quadrant ultrasound shows fatty liver but normal gallbladder. Patient still having persistent abdominal pain and has repeatedly requested CT scan. We'll proceed with CT scan for further evaluation. Pain only mildly improved with morphine. Give Bentyl, Protonix, GI cocktail, Phenergan and reassess.  Signed out to  Dr. Thomasene Lot who will follow up on CT scan.   EKG Interpretation  Date/Time:  Wednesday October 13 2015 06:14:59 EDT Ventricular Rate:  64 PR Interval:    QRS Duration: 83 QT Interval:  447 QTC Calculation: 462 R Axis:   41 Text Interpretation:  Sinus rhythm Borderline low voltage, extremity leads No significant change since last tracing Confirmed by Kanya Potteiger,  DO, Carsin Randazzo [phone]) on 10/13/2015 6:25:17 AM         [place], DO 10/13/15 [id number]

## 2015-10-13 NOTE — ED Triage Notes (Signed)
Pt. reports mid abdominal pain with nausea and diarrhea onset Saturday night , denies fever or chills .

## 2015-10-13 NOTE — Discharge Instructions (Signed)
We are happy to report that your CAT scan, labs, and x-ray are very reassuring. Your CT does show a large amount of stool. Please feel free to take a medication  prescribed to help with her constipation. Please return as needed with any concerns.

## 2015-10-13 NOTE — ED Notes (Signed)
Patient transported to X-ray 

## 2015-11-09 ENCOUNTER — Other Ambulatory Visit: Payer: Self-pay | Admitting: Internal Medicine

## 2015-11-09 MED FILL — ALPRAZolam 0.5 MG TABS: 0.5 | 30 days supply | Qty: 120 | Fill #3

## 2015-11-09 MED FILL — SERTRALINE HCL 100 MG TAB: 100 | 30 days supply | Qty: 30 | Fill #5

## 2015-11-09 MED FILL — OMEPRAZOLE DR 40 MG CAPSULE: 40 | 30 days supply | Qty: 60 | Fill #1

## 2015-11-09 MED FILL — LEVOTHYROXINE 200 MCG TAB: 200 | 30 days supply | Qty: 30 | Fill #5

## 2015-11-09 MED FILL — BUPROPION HCL XL 300 MG TAB: 300 | 30 days supply | Qty: 30 | Fill #0

## 2015-11-10 ENCOUNTER — Ambulatory Visit: Payer: Managed Care, Other (non HMO) | Admitting: Endocrinology

## 2015-12-03 ENCOUNTER — Other Ambulatory Visit: Payer: Self-pay | Admitting: Internal Medicine

## 2015-12-03 DIAGNOSIS — F418 Other specified anxiety disorders: Secondary | ICD-10-CM

## 2015-12-03 MED FILL — BUPROPION HCL XL 300 MG TAB: 300 | 30 days supply | Qty: 30 | Fill #1

## 2015-12-03 MED FILL — SERTRALINE HCL 100 MG TAB: 100 | 30 days supply | Qty: 30 | Fill #6

## 2015-12-03 MED FILL — MELOXICAM 15 MG TABLET: 15 | 30 days supply | Qty: 30 | Fill #5

## 2015-12-03 MED FILL — OMEPRAZOLE DR 40 MG CAPSULE: 40 | 30 days supply | Qty: 60 | Fill #2

## 2015-12-07 NOTE — Telephone Encounter (Signed)
rx faxed to pharmacy

## 2015-12-16 MED FILL — ALPRAZolam 0.5 MG TABS: 0.5 | 30 days supply | Qty: 120 | Fill #0

## 2015-12-16 MED FILL — LEVOTHYROXINE 200 MCG TAB: 200 | 30 days supply | Qty: 30 | Fill #0

## 2016-01-06 ENCOUNTER — Other Ambulatory Visit: Payer: Self-pay | Admitting: Internal Medicine

## 2016-01-06 MED FILL — metFORMIN HCL 1000 MG TABS: 1000 | 30 days supply | Qty: 60 | Fill #5

## 2016-01-06 MED FILL — BUPROPION HCL XL 300 MG TAB: 300 | 30 days supply | Qty: 30 | Fill #2

## 2016-01-06 MED FILL — OMEPRAZOLE DR 40 MG CAPSULE: 40 | 30 days supply | Qty: 60 | Fill #3

## 2016-01-06 MED FILL — SERTRALINE HCL 100 MG TAB: 100 | 30 days supply | Qty: 30 | Fill #0

## 2016-01-19 MED FILL — ALPRAZolam 0.5 MG TABS: 0.5 | 30 days supply | Qty: 120 | Fill #1

## 2016-01-19 MED FILL — LEVOTHYROXINE 200 MCG TAB: 200 | 30 days supply | Qty: 30 | Fill #1

## 2016-01-20 ENCOUNTER — Encounter: Payer: BLUE CROSS/BLUE SHIELD | Admitting: Gynecology

## 2016-01-21 ENCOUNTER — Encounter: Payer: Managed Care, Other (non HMO) | Admitting: Gynecology

## 2016-02-08 MED FILL — SERTRALINE HCL 100 MG TAB: 100 | 30 days supply | Qty: 30 | Fill #1

## 2016-02-09 ENCOUNTER — Other Ambulatory Visit: Payer: Self-pay | Admitting: Internal Medicine

## 2016-02-09 MED FILL — BUPROPION HCL XL 300 MG TAB: 300 | 30 days supply | Qty: 30 | Fill #0

## 2016-02-25 ENCOUNTER — Other Ambulatory Visit: Payer: Self-pay | Admitting: Internal Medicine

## 2016-02-25 DIAGNOSIS — E118 Type 2 diabetes mellitus with unspecified complications: Secondary | ICD-10-CM

## 2016-02-25 MED FILL — ALPRAZolam 0.5 MG TABS: 0.5 | 30 days supply | Qty: 120 | Fill #0

## 2016-02-25 MED FILL — LEVOTHYROXINE 200 MCG TAB: 200 | 30 days supply | Qty: 30 | Fill #2

## 2016-02-25 MED FILL — OMEPRAZOLE DR 40 MG CAPSULE: 40 | 30 days supply | Qty: 60 | Fill #4

## 2016-03-10 ENCOUNTER — Other Ambulatory Visit: Payer: Self-pay | Admitting: Internal Medicine

## 2016-03-10 DIAGNOSIS — E118 Type 2 diabetes mellitus with unspecified complications: Secondary | ICD-10-CM

## 2016-03-10 MED FILL — SERTRALINE HCL 100 MG TAB: 100 | 30 days supply | Qty: 30 | Fill #2

## 2016-03-10 MED FILL — BUPROPION HCL XL 300 MG TAB: 300 | 30 days supply | Qty: 30 | Fill #1

## 2016-03-25 DIAGNOSIS — Z23 Encounter for immunization: Secondary | ICD-10-CM | POA: Diagnosis not present

## 2016-03-30 ENCOUNTER — Other Ambulatory Visit: Payer: Self-pay | Admitting: Internal Medicine

## 2016-03-30 DIAGNOSIS — F418 Other specified anxiety disorders: Secondary | ICD-10-CM

## 2016-03-30 DIAGNOSIS — E118 Type 2 diabetes mellitus with unspecified complications: Secondary | ICD-10-CM

## 2016-03-30 MED FILL — OMEPRAZOLE DR 40 MG CAPSULE: 40 | 30 days supply | Qty: 60 | Fill #5

## 2016-04-05 NOTE — Telephone Encounter (Signed)
Pt has scheduled an appt for Monday.  Can she have rx for maintenance medications?

## 2016-04-10 ENCOUNTER — Encounter: Payer: Self-pay | Admitting: Internal Medicine

## 2016-04-10 ENCOUNTER — Ambulatory Visit (INDEPENDENT_AMBULATORY_CARE_PROVIDER_SITE_OTHER): Payer: 59 | Admitting: Internal Medicine

## 2016-04-10 ENCOUNTER — Other Ambulatory Visit: Payer: Self-pay | Admitting: Internal Medicine

## 2016-04-10 ENCOUNTER — Other Ambulatory Visit (INDEPENDENT_AMBULATORY_CARE_PROVIDER_SITE_OTHER): Payer: 59

## 2016-04-10 ENCOUNTER — Ambulatory Visit (HOSPITAL_COMMUNITY)
Admission: RE | Admit: 2016-04-10 | Discharge: 2016-04-10 | Disposition: A | Discharge status: Home / Self Care | Payer: 59 | Source: Ambulatory Visit | Attending: Surgery | Admitting: Surgery

## 2016-04-10 VITALS — BP 136/88 | HR 65 | Temp 98.0°F | Resp 16 | Ht 67.0 in | Wt 171.1 lb

## 2016-04-10 DIAGNOSIS — R7989 Other specified abnormal findings of blood chemistry: Secondary | ICD-10-CM

## 2016-04-10 DIAGNOSIS — E785 Hyperlipidemia, unspecified: Secondary | ICD-10-CM

## 2016-04-10 DIAGNOSIS — E118 Type 2 diabetes mellitus with unspecified complications: Secondary | ICD-10-CM

## 2016-04-10 DIAGNOSIS — E2839 Other primary ovarian failure: Secondary | ICD-10-CM

## 2016-04-10 DIAGNOSIS — E032 Hypothyroidism due to medicaments and other exogenous substances: Secondary | ICD-10-CM

## 2016-04-10 DIAGNOSIS — F5101 Primary insomnia: Secondary | ICD-10-CM

## 2016-04-10 DIAGNOSIS — F418 Other specified anxiety disorders: Secondary | ICD-10-CM

## 2016-04-10 DIAGNOSIS — R945 Abnormal results of liver function studies: Secondary | ICD-10-CM

## 2016-04-10 DIAGNOSIS — Z1231 Encounter for screening mammogram for malignant neoplasm of breast: Secondary | ICD-10-CM

## 2016-04-10 DIAGNOSIS — I70219 Atherosclerosis of native arteries of extremities with intermittent claudication, unspecified extremity: Secondary | ICD-10-CM | POA: Diagnosis not present

## 2016-04-10 DIAGNOSIS — M170 Bilateral primary osteoarthritis of knee: Secondary | ICD-10-CM | POA: Diagnosis not present

## 2016-04-10 LAB — CBC WITH DIFFERENTIAL/PLATELET
Basophils Absolute: 0 10*3/uL (ref 0.0–0.1)
Basophils Relative: 0.5 % (ref 0.0–3.0)
Eosinophils Absolute: 0 10*3/uL (ref 0.0–0.7)
Eosinophils Relative: 0 % (ref 0.0–5.0)
HCT: 43.7 % (ref 36.0–46.0)
Hemoglobin: 14.7 g/dL (ref 12.0–15.0)
Lymphocytes Relative: 40 % (ref 12.0–46.0)
Lymphs Abs: 3 10*3/uL (ref 0.7–4.0)
MCHC: 33.8 g/dL (ref 30.0–36.0)
MCV: 93.2 fl (ref 78.0–100.0)
Monocytes Absolute: 0.4 10*3/uL (ref 0.1–1.0)
Monocytes Relative: 5.7 % (ref 3.0–12.0)
Neutro Abs: 4.1 10*3/uL (ref 1.4–7.7)
Neutrophils Relative %: 53.8 % (ref 43.0–77.0)
Platelets: 175 10*3/uL (ref 150.0–400.0)
RBC: 4.69 Mil/uL (ref 3.87–5.11)
RDW: 12.7 % (ref 11.5–15.5)
WBC: 7.5 10*3/uL (ref 4.0–10.5)

## 2016-04-10 LAB — COMPREHENSIVE METABOLIC PANEL
ALT: 48 U/L — ABNORMAL HIGH (ref 0–35)
AST: 36 U/L (ref 0–37)
Albumin: 4.6 g/dL (ref 3.5–5.2)
Alkaline Phosphatase: 86 U/L (ref 39–117)
BUN: 14 mg/dL (ref 6–23)
CO2: 29 mEq/L (ref 19–32)
Calcium: 9.9 mg/dL (ref 8.4–10.5)
Chloride: 102 mEq/L (ref 96–112)
Creatinine, Ser: 0.87 mg/dL (ref 0.40–1.20)
GFR: 71.32 mL/min (ref >60.00)
Glucose, Bld: 104 mg/dL — ABNORMAL HIGH (ref 70–99)
Potassium: 4.1 mEq/L (ref 3.5–5.1)
Sodium: 139 mEq/L (ref 135–145)
Total Bilirubin: 0.5 mg/dL (ref 0.2–1.2)
Total Protein: 7.7 g/dL (ref 6.0–8.3)

## 2016-04-10 LAB — LIPID PANEL
Cholesterol: 231 mg/dL — ABNORMAL HIGH (ref 0–200)
HDL: 45 mg/dL (ref >39.00)
LDL Cholesterol: 149 mg/dL — ABNORMAL HIGH (ref 0–99)
NonHDL: 185.68
Total CHOL/HDL Ratio: 5
Triglycerides: 182 mg/dL — ABNORMAL HIGH (ref 0.0–149.0)
VLDL: 36.4 mg/dL (ref 0.0–40.0)

## 2016-04-10 LAB — HEMOGLOBIN A1C: Hgb A1c MFr Bld: 7.9 % — ABNORMAL HIGH (ref 4.6–6.5)

## 2016-04-10 LAB — TSH: TSH: 1.87 u[IU]/mL (ref 0.35–4.50)

## 2016-04-10 MED ORDER — SERTRALINE HCL 100 MG PO TABS: 200.0000 mg | ORAL_TABLET | Freq: Every day | ORAL | 1 refills | Status: DC

## 2016-04-10 MED ORDER — ESZOPICLONE 2 MG PO TABS: 2.0000 mg | ORAL_TABLET | Freq: Every evening | ORAL | 1 refills | Status: DC | PRN

## 2016-04-10 MED ORDER — LEVOTHYROXINE SODIUM 200 MCG PO TABS: ORAL_TABLET | ORAL | 1 refills | Status: DC

## 2016-04-10 MED ORDER — METFORMIN HCL 1000 MG PO TABS: 1000.0000 mg | ORAL_TABLET | Freq: Two times a day (BID) | ORAL | 1 refills | Status: DC

## 2016-04-10 MED ORDER — HYDROCODONE-ACETAMINOPHEN 5-325 MG PO TABS: 1.0000 | ORAL_TABLET | Freq: Four times a day (QID) | ORAL | 0 refills | Status: DC | PRN

## 2016-04-10 MED ORDER — DAPAGLIFLOZIN PROPANEDIOL 5 MG PO TABS: 5.0000 mg | ORAL_TABLET | Freq: Every day | ORAL | 1 refills | Status: DC

## 2016-04-10 MED ORDER — EZETIMIBE 10 MG PO TABS: 10.0000 mg | ORAL_TABLET | Freq: Every day | ORAL | 3 refills | Status: DC

## 2016-04-10 MED ORDER — ASPIRIN EC 81 MG PO TBEC: 81.0000 mg | DELAYED_RELEASE_TABLET | Freq: Every day | ORAL | 3 refills | Status: DC

## 2016-04-10 MED ORDER — ALPRAZOLAM 0.5 MG PO TABS: ORAL_TABLET | ORAL | 3 refills | Status: DC

## 2016-04-10 MED FILL — metFORMIN HCL 1000 MG TABS: 1000 | 30 days supply | Qty: 60 | Fill #0

## 2016-04-10 MED FILL — HYDROCODON-APAP 5-325: 5-325 | 7 days supply | Qty: 28 | Fill #0

## 2016-04-10 MED FILL — EZETIMIBE 10 MG TABLET: 10 | 30 days supply | Qty: 30 | Fill #0

## 2016-04-10 MED FILL — BUPROPION HCL XL 300 MG TAB: 300 | 30 days supply | Qty: 30 | Fill #2

## 2016-04-10 MED FILL — ASPIR-LOW 81 MG TABLET EC: 81 | 30 days supply | Qty: 30 | Fill #0

## 2016-04-10 MED FILL — ALPRAZolam 0.5 MG TABS: 0.5 | 23 days supply | Qty: 90 | Fill #0

## 2016-04-10 MED FILL — SERTRALINE HCL 100 MG TAB: 100 | 30 days supply | Qty: 60 | Fill #0

## 2016-04-10 MED FILL — FARXIGA 5 MG TABLET: 5 | 30 days supply | Qty: 30 | Fill #0

## 2016-04-10 MED FILL — ESZOPICLONE 2 MG TABLET: 2 | 30 days supply | Qty: 30 | Fill #0

## 2016-04-10 MED FILL — LEVOTHYROXINE 200 MCG TAB: 200 | 30 days supply | Qty: 30 | Fill #0

## 2016-04-10 NOTE — Progress Notes (Signed)
Subjective:  Patient ID: Tasha [name], female    DOB: [birth year]  Age: 56 y.o. MRN: [id number]  CC: Follow-up (PAIN IN JOINTS-ELBOWS, KNEES, ANKLE, FEET, ANXIETY, dm TYPE 2, DEPRESSION)   HPI Tasha [name] presents for f/up om multiple medical concerns.  Outpatient Medications Prior to Visit  Medication Sig Dispense Refill  . buPROPion (WELLBUTRIN XL) 300 MG 24 hr tablet TAKE 1 TABLET BY MOUTH ONCE DAILY 30 tablet 11  . Cholecalciferol 50000 UNITS TABS Take 1 tablet by mouth once a week. (Patient taking differently: Take 1 tablet by mouth every Monday. ) 12 tablet 3  . meloxicam (MOBIC) 15 MG tablet Take 1 tablet (15 mg total) by mouth daily as needed for pain. 30 tablet 6  . omeprazole (PRILOSEC) 40 MG capsule TAKE 2 CAPSULES BY MOUTH DAILY 60 capsule 11  . repaglinide (PRANDIN) 1 MG tablet Take 1 tablet (1 mg total) by mouth 3 (three) times daily before meals. 90 tablet 5  . ALPRAZolam (XANAX) 0.5 MG tablet TAKE 1 TABLET BY MOUTH 4 TIMES DAILY AS NEEDED FOR ANXIETY 120 tablet 0  . buPROPion (WELLBUTRIN XL) 300 MG 24 hr tablet Take 1 tablet (300 mg total) by mouth daily. 30 tablet 0  . cyclobenzaprine (FLEXERIL) 10 MG tablet Take 1 tablet (10 mg total) by mouth 2 (two) times daily as needed for muscle spasms. 20 tablet 0  . ezetimibe (ZETIA) 10 MG tablet Take 1 tablet (10 mg total) by mouth daily. 30 tablet 0  . HYDROcodone-acetaminophen (NORCO/VICODIN) 5-325 MG tablet Take 1 tablet by mouth every 6 (six) hours as needed. Not to exceed 4 tablets a day 120 tablet 0  . levothyroxine (SYNTHROID, LEVOTHROID) 200 MCG tablet Take 1 tablet by mouth daily.    Marland Kitchen levothyroxine (SYNTHROID, LEVOTHROID) 200 MCG tablet TAKE 1 TABLET BY MOUTH ONCE DAILY BEFORE BREAKFAST (TAKE IN ADDITION TO 50 MCG) 30 tablet 2  . ondansetron (ZOFRAN) 4 MG tablet Take 1 tablet (4 mg total) by mouth every 8 (eight) hours as needed for nausea or vomiting. 20 tablet 0  . polyethylene glycol (MIRALAX) packet Take 17 g by mouth  daily. 14 each 0  . sertraline (ZOLOFT) 100 MG tablet TAKE 1 TABLET BY MOUTH ONCE DAILY 90 tablet 2  . simethicone (GAS-X) 80 MG chewable tablet Chew 80 mg by mouth every 6 (six) hours as needed for flatulence.     No facility-administered medications prior to visit.     ROS Review of Systems  Constitutional: Negative.  Negative for appetite change, chills, fatigue and unexpected weight change.  HENT: Negative.  Negative for sinus pressure and trouble swallowing.   Eyes: Negative for photophobia and visual disturbance.  Cardiovascular: Negative.  Negative for chest pain, palpitations and leg swelling.  Gastrointestinal: Negative.  Negative for abdominal pain, constipation, diarrhea, nausea and vomiting.  Endocrine: Negative for cold intolerance, heat intolerance, polydipsia, polyphagia and polyuria.  Genitourinary: Negative.  Negative for decreased urine volume, difficulty urinating and urgency.  Musculoskeletal: Positive for arthralgias, back pain, myalgias and neck pain. Negative for gait problem and joint swelling.       She complains of diffuse widespread pain in all of her joints, her muscles, her neck and her back. She also complains of pain in her feet with activity.  Skin: Negative.  Negative for color change and rash.  Allergic/Immunologic: Negative.   Neurological: Negative.  Negative for dizziness, weakness, light-headedness and numbness.  Hematological: Negative for adenopathy. Does not bruise/bleed easily.  Psychiatric/Behavioral: Positive for dysphoric mood and sleep disturbance. Negative for agitation, behavioral problems, confusion, decreased concentration, hallucinations, self-injury and suicidal ideas. The patient is nervous/anxious. The patient is not hyperactive.        She complains of insomnia, irritability, anxiety, and panic attacks.    Objective:  BP 136/88   Pulse 65   Temp 98 F (36.7 C) (Oral)   Resp 16   Ht 5\' 7"  (1.702 m)   Wt 171 lb 2 oz (77.6 kg)    SpO2 96%   BMI 26.80 kg/m   BP Readings from Last 3 Encounters:  04/10/16 136/88  10/13/15 143/66  10/12/15 120/82    Wt Readings from Last 3 Encounters:  04/10/16 171 lb 2 oz (77.6 kg)  10/13/15 168 lb (76.2 kg)  10/12/15 168 lb (76.2 kg)    Physical Exam  Constitutional: She is oriented to person, place, and time. No distress.  HENT:  Nose: Nose normal.  Mouth/Throat: Oropharynx is clear and moist. No oropharyngeal exudate.  Eyes: Conjunctivae are normal. Right eye exhibits no discharge. Left eye exhibits no discharge. No scleral icterus.  Neck: Normal range of motion. Neck supple. No JVD present. No tracheal deviation present. No thyromegaly present.  Cardiovascular: Normal rate, regular rhythm, normal heart sounds and intact distal pulses.  Exam reveals no gallop and no friction rub.   No murmur heard. Pulmonary/Chest: Effort normal and breath sounds normal. No stridor. No respiratory distress. She has no wheezes. She has no rales. She exhibits no tenderness.  Abdominal: Soft. Bowel sounds are normal. She exhibits no distension and no mass. There is no tenderness. There is no rebound and no guarding.  Musculoskeletal: Normal range of motion. She exhibits no edema, tenderness or deformity.  Lymphadenopathy:    She has no cervical adenopathy.  Neurological: She is oriented to person, place, and time.  Skin: Skin is warm. No rash noted. She is not diaphoretic. No erythema. No pallor.  Vitals reviewed.   Lab Results  Component Value Date   WBC 7.5 04/10/2016   HGB 14.7 04/10/2016   HCT 43.7 04/10/2016   PLT 175.0 04/10/2016   GLUCOSE 104 (H) 04/10/2016   CHOL 231 (H) 04/10/2016   TRIG 182.0 (H) 04/10/2016   HDL 45.00 04/10/2016   LDLDIRECT 158.0 12/01/2013   LDLCALC 149 (H) 04/10/2016   ALT 48 (H) 04/10/2016   AST 36 04/10/2016   NA 139 04/10/2016   K 4.1 04/10/2016   CL 102 04/10/2016   CREATININE 0.87 04/10/2016   BUN 14 04/10/2016   CO2 29 04/10/2016   TSH  1.87 04/10/2016   HGBA1C 7.9 (H) 04/10/2016   MICROALBUR 0.7 04/08/2015    Ct Abdomen Pelvis W Contrast  Result Date: 10/13/2015 CLINICAL DATA:  Mid abdominal pain. EXAM: CT ABDOMEN AND PELVIS WITH CONTRAST TECHNIQUE: Multidetector CT imaging of the abdomen and pelvis was performed using the standard protocol following bolus administration of intravenous contrast. CONTRAST:  100 cc Isovue-300. COMPARISON:  05/27/2014. FINDINGS: Lower chest: Lung bases show no acute findings. Heart size normal. No pericardial or pleural effusion. Pre pericardiac lymph nodes are sub cm in short axis size. Hepatobiliary: Liver is diffusely decreased in attenuation. Liver and gallbladder are otherwise unremarkable. No biliary ductal dilatation. Pancreas: Negative. Spleen: Negative. Adrenals/Urinary Tract: Adrenal glands and kidneys are unremarkable. Ureters are decompressed. Bladder is grossly unremarkable. Stomach/Bowel: Stomach, small bowel, appendix and colon are unremarkable. Duodenal diverticulum is incidentally noted. Vascular/Lymphatic: Atherosclerotic calcification of the arterial vasculature without abdominal aortic  aneurysm. No pathologically enlarged lymph nodes. Reproductive: Hysterectomy.  No adnexal mass. Other: No free fluid.  Mesenteries and peritoneum are unremarkable. Musculoskeletal: No worrisome lytic or sclerotic lesions. Degenerative changes are seen in the spine. IMPRESSION: 1. No acute findings to explain the patient's pain. 2. Hepatic steatosis. Electronically Signed   By: Lorin Picket M.D.   On: 10/13/2015 10:27   Dg Abdomen Acute W/chest  Result Date: 10/13/2015 CLINICAL DATA:  Epigastric pain for 4 days with nausea EXAM: DG ABDOMEN ACUTE W/ 1V CHEST COMPARISON:  Chest radiograph March 26, 2015; CT abdomen and pelvis May 27, 2014 FINDINGS: PA chest: The lungs are clear. The heart size and pulmonary vascularity are normal. No adenopathy. Supine and upright abdomen: There is diffuse stool  throughout the colon. There is no bowel dilatation or air-fluid level to suggest obstruction. No free air. There are scattered foci of vascular calcification in the pelvis. IMPRESSION: Diffuse stool throughout colon. No bowel obstruction or free air. Scattered foci of vascular calcification consistent with atherosclerosis. No lung edema or consolidation. Electronically Signed   By: Lowella Grip III M.D.   On: 10/13/2015 07:52   US Abdomen Limited Ruq  Result Date: 10/13/2015 CLINICAL DATA:  Right upper quadrant pain. EXAM: US ABDOMEN LIMITED - RIGHT UPPER QUADRANT COMPARISON:  CT 05/27/2014. FINDINGS: Gallbladder: No gallstones or wall thickening visualized. No sonographic Murphy sign noted by sonographer. Common bile duct: Diameter: 6.7 mm Liver: Increased echogenicity consistent fatty infiltration and/or hepatocellular disease. No focal abnormality identified. IMPRESSION: 1. The liver has increased echogenicity consistent fatty infiltration and/or hepatocellular disease. No focal hepatic abnormality identified. 2.  Exam is otherwise unremarkable . Electronically Signed   By: Marcello Moores  Register   On: 10/13/2015 07:40    Assessment & Plan:   [name] was seen today for follow-up.  Diagnoses and all orders for this visit:  Hypothyroidism due to medication- her TSH is on the normal range, will cont the current T4 dose -     TSH; Future -     levothyroxine (SYNTHROID, LEVOTHROID) 200 MCG tablet; TAKE 1 TABLET BY MOUTH ONCE DAILY BEFORE BREAKFAST (TAKE IN ADDITION TO 50 MCG)  Type 2 diabetes mellitus with complication, without long-term current use of insulin ([place])- her A1C is up to 7.9%, will add an SGLT-2 inh to the metformin -     Comprehensive metabolic panel; Future -     Hemoglobin A1c; Future -     Ambulatory referral to Ophthalmology -     metFORMIN (GLUCOPHAGE) 1000 MG tablet; Take 1 tablet (1,000 mg total) by mouth 2 (two) times daily with a meal. -     dapagliflozin propanediol (FARXIGA) 5  MG TABS tablet; Take 5 mg by mouth daily.  Hyperlipidemia with target LDL less than 130- she has not achieved her LDL and is not will to take a statin or to do a [facility]-inh, will restart zetia -     Lipid panel; Future -     ezetimibe (ZETIA) 10 MG tablet; Take 1 tablet (10 mg total) by mouth daily.  Elevated LFTs- improvement noted, will screen for viral hepatitis -     Comprehensive metabolic panel; Future -     CBC with Differential/Platelet; Future -     Hepatitis A antibody, total; Future -     Hepatitis B core antibody, total; Future -     Hepatitis B surface antibody; Future -     Hepatitis C antibody; Future  Depression with anxiety- will increase the  sertraline dose and cont xanax as needed -     sertraline (ZOLOFT) 100 MG tablet; Take 2 tablets (200 mg total) by mouth daily. -     ALPRAZolam (XANAX) 0.5 MG tablet; TAKE 1 TABLET BY MOUTH 4 TIMES DAILY AS NEEDED FOR ANXIETY  Primary insomnia -     eszopiclone (LUNESTA) 2 MG TABS tablet; Take 1 tablet (2 mg total) by mouth at bedtime as needed for sleep. Take immediately before bedtime  Extremity atherosclerosis with intermittent claudication ([place])- will get ABI's to see how severe this is -     aspirin EC 81 MG tablet; Take 1 tablet (81 mg total) by mouth daily. -     VAS [place] ABI WITH/WO TBI; Future  Visit for screening mammogram -     MM DIGITAL SCREENING BILATERAL; Future  Estrogen deficiency -     DG Bone Density; Future  Primary osteoarthritis of both knees -     HYDROcodone-acetaminophen (NORCO/VICODIN) 5-325 MG tablet; Take 1 tablet by mouth every 6 (six) hours as needed. Not to exceed 4 tablets a day   I have discontinued Ms. Laurel's cyclobenzaprine, simethicone, ondansetron, polyethylene glycol, and sertraline. I have also changed her metFORMIN. Additionally, I am having her start on sertraline, eszopiclone, aspirin EC, and dapagliflozin propanediol. Lastly, I am having her maintain her Cholecalciferol, meloxicam,  omeprazole, repaglinide, buPROPion, HYDROcodone-acetaminophen, levothyroxine, ezetimibe, and ALPRAZolam.  Meds ordered this encounter  Medications  . DISCONTD: metFORMIN (GLUCOPHAGE) 1000 MG tablet    Sig: Take 1,000 mg by mouth daily with breakfast.  . sertraline (ZOLOFT) 100 MG tablet    Sig: Take 2 tablets (200 mg total) by mouth daily.    Dispense:  180 tablet    Refill:  1  . eszopiclone (LUNESTA) 2 MG TABS tablet    Sig: Take 1 tablet (2 mg total) by mouth at bedtime as needed for sleep. Take immediately before bedtime    Dispense:  90 tablet    Refill:  1  . aspirin EC 81 MG tablet    Sig: Take 1 tablet (81 mg total) by mouth daily.    Dispense:  90 tablet    Refill:  3  . HYDROcodone-acetaminophen (NORCO/VICODIN) 5-325 MG tablet    Sig: Take 1 tablet by mouth every 6 (six) hours as needed. Not to exceed 4 tablets a day    Dispense:  75 tablet    Refill:  0  . levothyroxine (SYNTHROID, LEVOTHROID) 200 MCG tablet    Sig: TAKE 1 TABLET BY MOUTH ONCE DAILY BEFORE BREAKFAST (TAKE IN ADDITION TO 50 MCG)    Dispense:  90 tablet    Refill:  1  . metFORMIN (GLUCOPHAGE) 1000 MG tablet    Sig: Take 1 tablet (1,000 mg total) by mouth 2 (two) times daily with a meal.    Dispense:  180 tablet    Refill:  1  . ezetimibe (ZETIA) 10 MG tablet    Sig: Take 1 tablet (10 mg total) by mouth daily.    Dispense:  90 tablet    Refill:  3  . ALPRAZolam (XANAX) 0.5 MG tablet    Sig: TAKE 1 TABLET BY MOUTH 4 TIMES DAILY AS NEEDED FOR ANXIETY    Dispense:  90 tablet    Refill:  3  . dapagliflozin propanediol (FARXIGA) 5 MG TABS tablet    Sig: Take 5 mg by mouth daily.    Dispense:  90 tablet    Refill:  1  Follow-up: Return in about 4 months (around 08/10/2016).  [name], MD

## 2016-04-10 NOTE — Progress Notes (Signed)
Pre-visit discussion using our clinic review tool. No additional management support is needed unless otherwise documented below in the visit note.  

## 2016-04-10 NOTE — Patient Instructions (Signed)

## 2016-04-11 ENCOUNTER — Encounter: Payer: Self-pay | Admitting: Internal Medicine

## 2016-04-11 LAB — HEPATITIS B CORE ANTIBODY, TOTAL: Hep B Core Total Ab: NONREACTIVE

## 2016-04-11 LAB — HEPATITIS C ANTIBODY: HCV Ab: NEGATIVE

## 2016-04-11 LAB — HEPATITIS A ANTIBODY, TOTAL: Hep A Total Ab: NONREACTIVE

## 2016-04-11 LAB — HEPATITIS B SURFACE ANTIBODY,QUALITATIVE: Hep B S Ab: NEGATIVE

## 2016-05-10 MED FILL — ASPIR-LOW 81 MG TABLET EC: 81 | 30 days supply | Qty: 30 | Fill #1

## 2016-05-10 MED FILL — BUPROPION HCL XL 300 MG TAB: 300 | 30 days supply | Qty: 30 | Fill #3

## 2016-05-10 MED FILL — metFORMIN HCL 1000 MG TABS: 1000 | 30 days supply | Qty: 60 | Fill #1

## 2016-05-10 MED FILL — OMEPRAZOLE DR 40 MG CAPSULE: 40 | 30 days supply | Qty: 60 | Fill #6

## 2016-05-10 MED FILL — EZETIMIBE 10 MG TABLET: 10 | 30 days supply | Qty: 30 | Fill #1

## 2016-05-10 MED FILL — SERTRALINE HCL 100 MG TAB: 100 | 30 days supply | Qty: 60 | Fill #1

## 2016-05-10 MED FILL — ESZOPICLONE 2 MG TABLET: 2 | 30 days supply | Qty: 30 | Fill #1

## 2016-05-10 MED FILL — FARXIGA 5 MG TABLET: 5 | 30 days supply | Qty: 30 | Fill #1

## 2016-05-10 MED FILL — LEVOTHYROXINE 200 MCG TAB: 200 | 30 days supply | Qty: 30 | Fill #1

## 2016-05-15 ENCOUNTER — Telehealth: Payer: Self-pay | Admitting: *Deleted

## 2016-05-15 ENCOUNTER — Other Ambulatory Visit: Payer: Self-pay | Admitting: Internal Medicine

## 2016-05-15 DIAGNOSIS — M170 Bilateral primary osteoarthritis of knee: Secondary | ICD-10-CM

## 2016-05-15 MED ORDER — HYDROCODONE-ACETAMINOPHEN 5-325 MG PO TABS: 1.0000 | ORAL_TABLET | Freq: Four times a day (QID) | ORAL | 0 refills | Status: DC | PRN

## 2016-05-15 NOTE — Telephone Encounter (Signed)
Pt left msg on triage requesting refills on her hydrocodone and alprazolam.../lmb

## 2016-05-15 NOTE — Telephone Encounter (Signed)
See xanax RX 04/10/16 norco Rx written

## 2016-05-16 NOTE — Telephone Encounter (Signed)
Notified pt rx ready for pick-up. Also she need to check w/her pharmacy on the xanax script MD gave her on 3/5. She has 3 additional refills on script...Tasha [name]

## 2016-05-23 MED FILL — ALPRAZolam 0.5 MG TABS: 0.5 | 23 days supply | Qty: 90 | Fill #1

## 2016-05-23 MED FILL — HYDROCODON-APAP 5-325: 5-325 | 18 days supply | Qty: 75 | Fill #0

## 2016-05-30 DIAGNOSIS — E119 Type 2 diabetes mellitus without complications: Secondary | ICD-10-CM | POA: Diagnosis not present

## 2016-05-30 DIAGNOSIS — H524 Presbyopia: Secondary | ICD-10-CM | POA: Diagnosis not present

## 2016-05-30 LAB — HM DIABETES EYE EXAM

## 2016-05-31 ENCOUNTER — Encounter: Payer: Self-pay | Admitting: Internal Medicine

## 2016-06-13 MED FILL — SERTRALINE HCL 100 MG TAB: 100 | 30 days supply | Qty: 60 | Fill #2 | Status: TO

## 2016-06-13 MED FILL — SM ASPIRIN EC 81 MG TABLET: 81 | 30 days supply | Qty: 30 | Fill #2 | Status: TO

## 2016-06-13 MED FILL — FARXIGA 5 MG TABLET: 5 | 30 days supply | Qty: 30 | Fill #2 | Status: TO

## 2016-06-13 MED FILL — LEVOTHYROXINE 200 MCG TAB: 200 | 30 days supply | Qty: 30 | Fill #2 | Status: TO

## 2016-06-13 MED FILL — ESZOPICLONE 2 MG TABLET: 2 | 30 days supply | Qty: 30 | Fill #2 | Status: TO

## 2016-06-13 MED FILL — OMEPRAZOLE DR 40 MG CAPSULE: 40 | 30 days supply | Qty: 60 | Fill #7 | Status: TO

## 2016-06-13 MED FILL — metFORMIN HCL 1000 MG TABS: 1000 | 30 days supply | Qty: 60 | Fill #2 | Status: TO

## 2016-06-13 MED FILL — BUPROPION HCL XL 300 MG TAB: 300 | 30 days supply | Qty: 30 | Fill #4 | Status: TO

## 2016-06-13 MED FILL — EZETIMIBE 10 MG TABLET: 10 | 30 days supply | Qty: 30 | Fill #2 | Status: TO

## 2016-06-13 MED FILL — ALPRAZolam 0.5 MG TABS: 0.5 | 23 days supply | Qty: 90 | Fill #2 | Status: TO

## 2016-06-21 ENCOUNTER — Encounter: Payer: Self-pay | Admitting: Gynecology

## 2016-07-17 ENCOUNTER — Encounter: Payer: Self-pay | Admitting: Family Medicine

## 2016-07-17 ENCOUNTER — Ambulatory Visit (INDEPENDENT_AMBULATORY_CARE_PROVIDER_SITE_OTHER): Payer: 59 | Admitting: Family Medicine

## 2016-07-17 ENCOUNTER — Other Ambulatory Visit: Payer: Self-pay | Admitting: Internal Medicine

## 2016-07-17 VITALS — BP 120/62 | HR 64 | Temp 97.8°F | Resp 12 | Ht 67.0 in | Wt 169.2 lb

## 2016-07-17 DIAGNOSIS — I70219 Atherosclerosis of native arteries of extremities with intermittent claudication, unspecified extremity: Secondary | ICD-10-CM | POA: Diagnosis not present

## 2016-07-17 DIAGNOSIS — M542 Cervicalgia: Secondary | ICD-10-CM

## 2016-07-17 DIAGNOSIS — F418 Other specified anxiety disorders: Secondary | ICD-10-CM

## 2016-07-17 DIAGNOSIS — F5101 Primary insomnia: Secondary | ICD-10-CM

## 2016-07-17 DIAGNOSIS — I889 Nonspecific lymphadenitis, unspecified: Secondary | ICD-10-CM

## 2016-07-17 MED ORDER — AMOXICILLIN 500 MG PO CAPS: 500.0000 mg | ORAL_CAPSULE | Freq: Two times a day (BID) | ORAL | 0 refills | Status: DC

## 2016-07-17 MED FILL — metFORMIN HCL 1000 MG TABS: 1000 | 30 days supply | Qty: 60 | Fill #3 | Status: TO

## 2016-07-17 MED FILL — ASPIRIN EC 81 MG TABLET: 81 | 30 days supply | Qty: 30 | Fill #3 | Status: TO

## 2016-07-17 MED FILL — FARXIGA 5 MG TABLET: 5 | 30 days supply | Qty: 30 | Fill #3 | Status: TO

## 2016-07-17 MED FILL — BUPROPION HCL XL 300 MG TAB: 300 | 30 days supply | Qty: 30 | Fill #5 | Status: TO

## 2016-07-17 MED FILL — SERTRALINE HCL 100 MG TAB: 100 | 30 days supply | Qty: 60 | Fill #3 | Status: TO

## 2016-07-17 MED FILL — ALPRAZolam 0.5 MG TABS: 0.5 | 22 days supply | Qty: 90 | Fill #0

## 2016-07-17 MED FILL — OMEPRAZOLE DR 40 MG CAPSULE: 40 | 30 days supply | Qty: 60 | Fill #8 | Status: TO

## 2016-07-17 MED FILL — ESZOPICLONE 2 MG TABLET: 2 | 30 days supply | Qty: 30 | Fill #0

## 2016-07-17 MED FILL — AMOXICILLIN 500 MG CAPSULE: 500 | 7 days supply | Qty: 14 | Fill #0

## 2016-07-17 MED FILL — EZETIMIBE 10 MG TABLET: 10 | 30 days supply | Qty: 30 | Fill #3 | Status: TO

## 2016-07-17 NOTE — Patient Instructions (Signed)
  Ms.Kim D Eisenhuth I have seen you today for an acute visit.  A few things to remember from today's visit:   Cervical lymphadenitis - Plan: amoxicillin (AMOXIL) 500 MG capsule    No significant finding on exam. Because Hx of smoking Amoxicillin recommended but if viral this will not help.  Monitor for new symptoms.     Medications prescribed today are intended for short period of time and will not be refill upon request, a follow up appointment might be necessary to discuss continuation of of treatment if appropriate.     In general please monitor for signs of worsening symptoms and seek immediate medical attention if any concerning.  If symptoms are not resolved in 3-4 weeks you should schedule a follow up appointment with your doctor, before if needed.  Please be sure you have an appointment already scheduled with your PCP before you leave today.

## 2016-07-17 NOTE — Progress Notes (Signed)
HPI:   ACUTE VISIT:  Chief Complaint  Patient presents with  . Neck Pain    Left-sided    Tasha [name] is a 56 y.o. female, who is here today complaining of 3-4 days of worsening fatigue and 1-2 days of left neck pain with palpation and with ROM. Pain is not radiated, exacerbated by palpation of area, cervical movement and sometimes by swallowing. She has not identified alleviating factors. She denies associated dysphagia, stridor, oral lesions, skin rash, or neck edema.  She denies any injury.  Neck Pain   This is a new problem. The current episode started in the past 7 days. The problem occurs constantly. The problem has been gradually worsening. The pain is present in the left side. The pain is at a severity of 4/10. The symptoms are aggravated by position and swallowing. Stiffness is present all day. Associated symptoms include headaches and pain with swallowing. Pertinent negatives include no fever, tingling, trouble swallowing, weakness or weight loss. She has tried oral narcotics for the symptoms. The treatment provided no relief.   She denies any sick contact or overseas travel. Red ants bite about 2 weeks ago, some reach her neck. Lesions are already healed.  + Smoker. She has history of chronic pain, currently she is on Norco 5/325 mg. Mild voice changes that happens intermittently, has had it before, not sure if related to neck pain.   Review of Systems  Constitutional: Positive for fatigue. Negative for activity change, appetite change, fever and weight loss.  HENT: Positive for sore throat and voice change. Negative for ear pain, facial swelling, mouth sores, sinus pressure and trouble swallowing.   Eyes: Negative for discharge and redness.  Respiratory: Negative for cough, shortness of breath and wheezing.   Gastrointestinal: Negative for abdominal pain, diarrhea, nausea and vomiting.  Musculoskeletal: Positive for neck pain.  Skin: Negative for color  change and rash.  Allergic/Immunologic: Positive for environmental allergies.  Neurological: Positive for headaches. Negative for tingling, syncope and weakness.  Hematological: Negative for adenopathy. Does not bruise/bleed easily.  Psychiatric/Behavioral: Negative for confusion. The patient is nervous/anxious.       Current Outpatient Prescriptions on File Prior to Visit  Medication Sig Dispense Refill  . ALPRAZolam (XANAX) 0.5 MG tablet TAKE 1 TABLET BY MOUTH 4 TIMES DAILY AS NEEDED FOR ANXIETY 90 tablet 3  . aspirin EC 81 MG tablet Take 1 tablet (81 mg total) by mouth daily. 90 tablet 3  . buPROPion (WELLBUTRIN XL) 300 MG 24 hr tablet TAKE 1 TABLET BY MOUTH ONCE DAILY 30 tablet 11  . Cholecalciferol 50000 UNITS TABS Take 1 tablet by mouth once a week. (Patient taking differently: Take 1 tablet by mouth every Monday. ) 12 tablet 3  . dapagliflozin propanediol (FARXIGA) 5 MG TABS tablet Take 5 mg by mouth daily. 90 tablet 1  . eszopiclone (LUNESTA) 2 MG TABS tablet TAKE 1 TABLET BY MOUTH ONCE DAILY AT BEDTIME AS NEEDED FOR SLEEP. TAKE IMMEDIATELY BEFORE BEDTIME 90 tablet 1  . ezetimibe (ZETIA) 10 MG tablet Take 1 tablet (10 mg total) by mouth daily. 90 tablet 3  . HYDROcodone-acetaminophen (NORCO/VICODIN) 5-325 MG tablet Take 1 tablet by mouth every 6 (six) hours as needed. Not to exceed 4 tablets a day 75 tablet 0  . levothyroxine (SYNTHROID, LEVOTHROID) 200 MCG tablet TAKE 1 TABLET BY MOUTH ONCE DAILY BEFORE BREAKFAST (TAKE IN ADDITION TO 50 MCG) 90 tablet 1  . meloxicam (MOBIC) 15 MG tablet Take  1 tablet (15 mg total) by mouth daily as needed for pain. 30 tablet 6  . metFORMIN (GLUCOPHAGE) 1000 MG tablet Take 1 tablet (1,000 mg total) by mouth 2 (two) times daily with a meal. 180 tablet 1  . omeprazole (PRILOSEC) 40 MG capsule TAKE 2 CAPSULES BY MOUTH DAILY 60 capsule 11  . repaglinide (PRANDIN) 1 MG tablet Take 1 tablet (1 mg total) by mouth 3 (three) times daily before meals. 90 tablet  5  . sertraline (ZOLOFT) 100 MG tablet Take 2 tablets (200 mg total) by mouth daily. 180 tablet 1   No current facility-administered medications on file prior to visit.      Past Medical History:  Diagnosis Date  . Anxiety state, unspecified   . Arthralgia of temporomandibular joint   . Conversion disorder   . Depressive disorder, not elsewhere classified   . Dysphagia, unspecified(787.20)   . Endometriosis   . Esophageal reflux   . Headache(784.0)   . Irritable bowel syndrome   . Mitral valve disorders(424.0)   . Myalgia and myositis, unspecified   . Overweight(278.02)   . Periodic limb movement disorder   . Pure hypercholesterolemia   . Unspecified hypothyroidism    Allergies  Allergen Reactions  . Statins Other (See Comments)    Muscle weakness/pains  . Crestor [Rosuvastatin] Other (See Comments)    Muscle aches  . Lipitor [Atorvastatin] Other (See Comments)    Muscle aches  . Zocor [Simvastatin] Other (See Comments)    Leg pain per patient/PE    Social History   Social History  . Marital status: Married    Spouse name: Tasha [name]  . Number of children: 2  . Years of education: N/A   Occupational History  . Disabled   .  Uenmployed   Social History Main Topics  . Smoking status: Light Tobacco Smoker    Packs/day: 1.00    Types: Cigarettes  . Smokeless tobacco: Never Used  . Alcohol use No  . Drug use: No  . Sexual activity: Yes   Other Topics Concern  . None   Social History Narrative  . None    Vitals:   07/17/16 1518  BP: 120/62  Pulse: 64  Resp: 12  Temp: 97.8 F (36.6 C)  O2 sat at RA 97% Body mass index is 26.51 kg/m.   Physical Exam  Nursing note and vitals reviewed. Constitutional: She is oriented to person, place, and time. She appears well-developed and well-nourished. She does not appear ill. No distress.  HENT:  Head: Atraumatic.  Nose: Right sinus exhibits no maxillary sinus tenderness and no frontal sinus  tenderness. Left sinus exhibits maxillary sinus tenderness (Miminal,pressure sensation). Left sinus exhibits no frontal sinus tenderness.  Mouth/Throat: Oropharynx is clear and moist and mucous membranes are normal.  Eyes: Conjunctivae are normal.  Neck: Normal range of motion. Muscular tenderness (Left-sidded) present. No tracheal deviation, no edema and no erythema present. No thyroid mass and no thyromegaly present.  Rotation towards right side elicits pain on left side.   Cardiovascular: Normal rate and regular rhythm.   No murmur heard. Respiratory: Effort normal and breath sounds normal. No respiratory distress.  Musculoskeletal: She exhibits no edema.  No tenderness upon palpation of left sternocleidomastoid muscle and no deformities appreciated.   Lymphadenopathy:       Head (right side): No submandibular adenopathy present.       Head (left side): No submandibular adenopathy present.    She has no cervical adenopathy.  Tenderness upon palpation of anterior and posterior left cervical lymph nodes as well as left submandibular. I do not appreciate enlarged lymph nodes or skin changes.  Neurological: She is alert and oriented to person, place, and time. She has normal strength.  Skin: Skin is warm. No rash noted. No erythema.  Psychiatric: Her speech is normal. Her mood appears anxious.  Fairly groomed, good eye contact.    ASSESSMENT AND PLAN:   [name] was seen today for neck pain.  Diagnoses and all orders for this visit:  Cervical pain (neck)  We discussed other possible etiologies: MS, URI among some. She can continue with Norco for pain management, no changes.  Cervical lymphadenitis  No significant finding son exam except for pain with palpation of cervical lymph nodes. Explained that this could be viral and self limited. Because Hx of smoking and reporting that symptoms are getting worse I recommended Amoxicillin. Some side effects of abx  discussed. Instructed about  warning signs.  Strongly recommend smoking cessation, adverse effects discussed.  -     amoxicillin (AMOXIL) 500 MG capsule; Take 1 capsule (500 mg total) by mouth 2 (two) times daily.     -Ms.Ermalene Searing was advised to seek immediate medical attention is symptoms suddenly get worse or to follow if symptoms persist or new concerns arise.       Kaniya Trueheart G. [place], MD  [facility]. [place] office.

## 2016-07-17 NOTE — Telephone Encounter (Signed)
Faxed script back to [facility] outpatient pharmacy...Johny Chess

## 2016-07-18 ENCOUNTER — Telehealth: Payer: Self-pay | Admitting: Internal Medicine

## 2016-07-18 MED ORDER — DOXYCYCLINE HYCLATE 100 MG PO TABS: 100.0000 mg | ORAL_TABLET | Freq: Two times a day (BID) | ORAL | 0 refills | Status: DC

## 2016-07-18 MED FILL — DOXYCYCLINE HYC 100 MG TAB: 100 | 7 days supply | Qty: 14 | Fill #0

## 2016-07-18 NOTE — Telephone Encounter (Signed)
Stop Amoxicillin. Still as I explained during visit symptoms could be viral, so she could monitor. Or she can take Doxycycline 100 mg bid x 7 days if she insists on abx treatment. F/U with her PCP if symptoms persist.  Thanks BJ

## 2016-07-18 NOTE — Telephone Encounter (Signed)
Pt seen yesterday and was prescribed amoxicillin (AMOXIL) 500 MG capsule  Pt states that does not work for her and is requesting  Doxycycline  [place], [place]

## 2016-07-18 NOTE — Telephone Encounter (Signed)
Rx sent- patient aware.  

## 2016-07-18 NOTE — Telephone Encounter (Signed)
Okay to switch antibiotics?

## 2016-08-15 MED FILL — CHLORHEXIDINE 0.12% RINSE: 0.12 | 15 days supply | Qty: 473 | Fill #0

## 2016-08-15 MED FILL — AMOXICILLIN 500 MG CAPSULE: 500 | 7 days supply | Qty: 21 | Fill #0

## 2016-08-24 MED FILL — FARXIGA 5 MG TABLET: 5 | 30 days supply | Qty: 30 | Fill #4 | Status: TO

## 2016-08-24 MED FILL — BUPROPION HCL XL 300 MG TAB: 300 | 30 days supply | Qty: 30 | Fill #6 | Status: TO

## 2016-08-24 MED FILL — OMEPRAZOLE DR 40 MG CAPSULE: 40 | 30 days supply | Qty: 60 | Fill #9 | Status: TO

## 2016-08-24 MED FILL — metFORMIN HCL 1000 MG TABS: 1000 | 30 days supply | Qty: 60 | Fill #4 | Status: TO

## 2016-08-24 MED FILL — SERTRALINE HCL 100 MG TAB: 100 | 30 days supply | Qty: 60 | Fill #4 | Status: TO

## 2016-08-24 MED FILL — ASPIRIN EC 81 MG TABLET: 81 | 30 days supply | Qty: 30 | Fill #4 | Status: TO

## 2016-08-24 MED FILL — LEVOTHYROXINE 200 MCG TAB: 200 | 30 days supply | Qty: 30 | Fill #0 | Status: TO

## 2016-08-24 MED FILL — ALPRAZolam 0.5 MG TABS: 0.5 | 22 days supply | Qty: 90 | Fill #1

## 2016-08-24 MED FILL — EZETIMIBE 10 MG TABLET: 10 | 30 days supply | Qty: 30 | Fill #4 | Status: TO

## 2016-09-18 MED FILL — SERTRALINE HCL 100 MG TAB: 100 | 30 days supply | Qty: 60 | Fill #5 | Status: TO

## 2016-09-18 MED FILL — LEVOTHYROXINE 200 MCG TAB: 200 | 30 days supply | Qty: 30 | Fill #1 | Status: TO

## 2016-09-18 MED FILL — OMEPRAZOLE DR 40 MG CAPSULE: 40 | 30 days supply | Qty: 60 | Fill #10 | Status: TO

## 2016-09-18 MED FILL — metFORMIN HCL 1000 MG TABS: 1000 | 30 days supply | Qty: 60 | Fill #5 | Status: TO

## 2016-09-18 MED FILL — FARXIGA 5 MG TABLET: 5 | 30 days supply | Qty: 30 | Fill #5 | Status: TO

## 2016-09-18 MED FILL — EZETIMIBE 10 MG TABLET: 10 | 30 days supply | Qty: 30 | Fill #5 | Status: TO

## 2016-09-18 MED FILL — buPROPion HCL ER (XL) 300 M: 300 | 30 days supply | Qty: 30 | Fill #7 | Status: TO

## 2016-09-18 MED FILL — ASPIRIN ADULT LOW STRENGTH: 81 | 30 days supply | Qty: 30 | Fill #5 | Status: TO

## 2016-09-19 ENCOUNTER — Telehealth: Payer: Self-pay | Admitting: Internal Medicine

## 2016-09-19 MED FILL — ALPRAZolam 0.5 MG TABS: 0.5 | 22 days supply | Qty: 90 | Fill #2

## 2016-09-19 NOTE — Telephone Encounter (Signed)
Decline sending to pharmacy without patient expressed interest.

## 2016-09-19 NOTE — Telephone Encounter (Signed)
Forwarding to MD assistant think this is one of those solicitant pharmacy request these...Tasha [name]

## 2016-09-19 NOTE — Telephone Encounter (Signed)
Danny from [facility] called stating that they have faxed over a request for refills on Omega 3, Vitamin D and Lidocaine Cream but have not received a response. They are resending the fax again this morning. Ref# [id number]

## 2016-10-17 ENCOUNTER — Telehealth: Payer: Self-pay | Admitting: Internal Medicine

## 2016-10-17 ENCOUNTER — Other Ambulatory Visit: Payer: Self-pay | Admitting: Internal Medicine

## 2016-10-17 DIAGNOSIS — K219 Gastro-esophageal reflux disease without esophagitis: Secondary | ICD-10-CM

## 2016-10-17 DIAGNOSIS — E032 Hypothyroidism due to medicaments and other exogenous substances: Secondary | ICD-10-CM

## 2016-10-17 DIAGNOSIS — F418 Other specified anxiety disorders: Secondary | ICD-10-CM

## 2016-10-17 DIAGNOSIS — E118 Type 2 diabetes mellitus with unspecified complications: Secondary | ICD-10-CM

## 2016-10-17 MED ORDER — OMEPRAZOLE 40 MG PO CPDR: 80.0000 mg | DELAYED_RELEASE_CAPSULE | Freq: Every day | ORAL | 11 refills | Status: DC

## 2016-10-17 MED ORDER — DAPAGLIFLOZIN PROPANEDIOL 5 MG PO TABS: 5.0000 mg | ORAL_TABLET | Freq: Every day | ORAL | 0 refills | Status: DC

## 2016-10-17 MED ORDER — LEVOTHYROXINE SODIUM 200 MCG PO TABS: 200.0000 ug | ORAL_TABLET | Freq: Every day | ORAL | 0 refills | Status: DC

## 2016-10-17 MED ORDER — METFORMIN HCL 1000 MG PO TABS: 1000.0000 mg | ORAL_TABLET | Freq: Two times a day (BID) | ORAL | 0 refills | Status: DC

## 2016-10-17 MED ORDER — SERTRALINE HCL 100 MG PO TABS: 200.0000 mg | ORAL_TABLET | Freq: Every day | ORAL | 0 refills | Status: DC

## 2016-10-17 MED FILL — OMEPRAZOLE DR 40 MG CAPSULE: 40 | 30 days supply | Qty: 60 | Fill #0

## 2016-10-17 MED FILL — FARXIGA 5 MG TABLET: 5 | 30 days supply | Qty: 30 | Fill #0

## 2016-10-17 MED FILL — EZETIMIBE 10 MG TABLET: 10 | 30 days supply | Qty: 30 | Fill #6 | Status: TO

## 2016-10-17 MED FILL — metFORMIN HCL 1000 MG TABS: 1000 | 30 days supply | Qty: 60 | Fill #0

## 2016-10-17 MED FILL — ASPIRIN ADULT LOW STRENGTH: 81 | 30 days supply | Qty: 30 | Fill #6 | Status: TO

## 2016-10-17 MED FILL — ALPRAZolam 0.5 MG TABS: 0.5 | 22 days supply | Qty: 90 | Fill #3

## 2016-10-17 MED FILL — LEVOTHYROXINE 200 MCG TAB: 200 | 30 days supply | Qty: 30 | Fill #0

## 2016-10-17 MED FILL — BUPROPION HCL XL 300 MG TAB: 300 | 30 days supply | Qty: 30 | Fill #8 | Status: TO

## 2016-10-17 MED FILL — SERTRALINE HCL 100 MG TAB: 100 | 30 days supply | Qty: 60 | Fill #0

## 2016-10-17 NOTE — Telephone Encounter (Signed)
These have been sent 

## 2016-10-17 NOTE — Addendum Note (Signed)
Addended by: Aviva Signs M on: 10/17/2016 04:49 PM   Modules accepted: Orders

## 2016-10-17 NOTE — Telephone Encounter (Signed)
Pt called regarding these refills. She made an appointment to be seen on Tuesday 10/24/16. She asked if these could be filled before the visit so that she does not have to go without them.
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
## 2016-10-18 NOTE — Telephone Encounter (Signed)
Pt informed

## 2016-10-24 ENCOUNTER — Encounter: Payer: Self-pay | Admitting: Internal Medicine

## 2016-10-24 ENCOUNTER — Ambulatory Visit (INDEPENDENT_AMBULATORY_CARE_PROVIDER_SITE_OTHER)
Admission: RE | Admit: 2016-10-24 | Discharge: 2016-10-24 | Disposition: A | Discharge status: Home / Self Care | Payer: 59 | Source: Ambulatory Visit | Attending: Internal Medicine | Admitting: Internal Medicine

## 2016-10-24 ENCOUNTER — Other Ambulatory Visit (INDEPENDENT_AMBULATORY_CARE_PROVIDER_SITE_OTHER): Payer: Medicare Other

## 2016-10-24 ENCOUNTER — Ambulatory Visit (INDEPENDENT_AMBULATORY_CARE_PROVIDER_SITE_OTHER): Payer: 59 | Admitting: Internal Medicine

## 2016-10-24 VITALS — BP 130/80 | HR 72 | Temp 98.1°F | Resp 16 | Ht 67.0 in | Wt 167.5 lb

## 2016-10-24 DIAGNOSIS — I70219 Atherosclerosis of native arteries of extremities with intermittent claudication, unspecified extremity: Secondary | ICD-10-CM

## 2016-10-24 DIAGNOSIS — Z79891 Long term (current) use of opiate analgesic: Secondary | ICD-10-CM | POA: Diagnosis not present

## 2016-10-24 DIAGNOSIS — E118 Type 2 diabetes mellitus with unspecified complications: Secondary | ICD-10-CM

## 2016-10-24 DIAGNOSIS — F418 Other specified anxiety disorders: Secondary | ICD-10-CM

## 2016-10-24 DIAGNOSIS — Z23 Encounter for immunization: Secondary | ICD-10-CM | POA: Diagnosis not present

## 2016-10-24 DIAGNOSIS — J4520 Mild intermittent asthma, uncomplicated: Secondary | ICD-10-CM | POA: Insufficient documentation

## 2016-10-24 DIAGNOSIS — E785 Hyperlipidemia, unspecified: Secondary | ICD-10-CM

## 2016-10-24 DIAGNOSIS — M170 Bilateral primary osteoarthritis of knee: Secondary | ICD-10-CM | POA: Diagnosis not present

## 2016-10-24 DIAGNOSIS — Z1231 Encounter for screening mammogram for malignant neoplasm of breast: Secondary | ICD-10-CM

## 2016-10-24 DIAGNOSIS — R5383 Other fatigue: Secondary | ICD-10-CM | POA: Diagnosis not present

## 2016-10-24 DIAGNOSIS — R05 Cough: Secondary | ICD-10-CM | POA: Diagnosis not present

## 2016-10-24 DIAGNOSIS — R059 Cough, unspecified: Secondary | ICD-10-CM

## 2016-10-24 DIAGNOSIS — J449 Chronic obstructive pulmonary disease, unspecified: Secondary | ICD-10-CM

## 2016-10-24 DIAGNOSIS — E039 Hypothyroidism, unspecified: Secondary | ICD-10-CM | POA: Diagnosis not present

## 2016-10-24 DIAGNOSIS — J4489 Other specified chronic obstructive pulmonary disease: Secondary | ICD-10-CM

## 2016-10-24 LAB — POCT EXHALED NITRIC OXIDE: FeNO level (ppb): 21

## 2016-10-24 LAB — URINALYSIS, ROUTINE W REFLEX MICROSCOPIC
Bilirubin Urine: NEGATIVE
Hgb urine dipstick: NEGATIVE
Ketones, ur: NEGATIVE
Leukocytes, UA: NEGATIVE
Nitrite: NEGATIVE
Specific Gravity, Urine: >=1.03 — AB (ref 1.000–1.030)
Total Protein, Urine: NEGATIVE
Urine Glucose: >=1000 — AB
Urobilinogen, UA: 0.2 (ref 0.0–1.0)
pH: 5 (ref 5.0–8.0)

## 2016-10-24 LAB — HEMOGLOBIN A1C: Hgb A1c MFr Bld: 7.8 % — ABNORMAL HIGH (ref 4.6–6.5)

## 2016-10-24 LAB — COMPREHENSIVE METABOLIC PANEL
ALT: 53 U/L — ABNORMAL HIGH (ref 0–35)
AST: 36 U/L (ref 0–37)
Albumin: 4.3 g/dL (ref 3.5–5.2)
Alkaline Phosphatase: 96 U/L (ref 39–117)
BUN: 15 mg/dL (ref 6–23)
CO2: 26 mEq/L (ref 19–32)
Calcium: 9.7 mg/dL (ref 8.4–10.5)
Chloride: 102 mEq/L (ref 96–112)
Creatinine, Ser: 0.79 mg/dL (ref 0.40–1.20)
GFR: 79.57 mL/min (ref >60.00)
Glucose, Bld: 160 mg/dL — ABNORMAL HIGH (ref 70–99)
Potassium: 4 mEq/L (ref 3.5–5.1)
Sodium: 138 mEq/L (ref 135–145)
Total Bilirubin: 0.6 mg/dL (ref 0.2–1.2)
Total Protein: 7.5 g/dL (ref 6.0–8.3)

## 2016-10-24 LAB — MICROALBUMIN / CREATININE URINE RATIO
Creatinine,U: 109.3 mg/dL
Microalb Creat Ratio: 0.6 mg/g (ref 0.0–30.0)
Microalb, Ur: <0.7 mg/dL (ref 0.0–1.9)

## 2016-10-24 LAB — TSH: TSH: <0.01 u[IU]/mL — ABNORMAL LOW (ref 0.35–4.50)

## 2016-10-24 MED ORDER — CYANOCOBALAMIN 1000 MCG/ML IJ SOLN
1000.0000 ug | Freq: Once | INTRAMUSCULAR | Status: AC
  Administered 2016-10-24: 1000 ug via INTRAMUSCULAR

## 2016-10-24 MED ORDER — LEVOTHYROXINE SODIUM 150 MCG PO TABS: 150.0000 ug | ORAL_TABLET | Freq: Every day | ORAL | 0 refills | Status: DC

## 2016-10-24 MED ORDER — HYDROCODONE-ACETAMINOPHEN 5-325 MG PO TABS: 1.0000 | ORAL_TABLET | Freq: Four times a day (QID) | ORAL | 0 refills | Status: DC | PRN

## 2016-10-24 MED ORDER — BUPROPION HCL ER (XL) 150 MG PO TB24: 150.0000 mg | ORAL_TABLET | Freq: Every day | ORAL | 2 refills | Status: DC

## 2016-10-24 MED ORDER — FLUTICASONE-UMECLIDIN-VILANT 100-62.5-25 MCG/INH IN AEPB: 1.0000 | INHALATION_SPRAY | Freq: Every day | RESPIRATORY_TRACT | 11 refills | Status: DC

## 2016-10-24 MED FILL — HYDROCODON-APAP 5-325: 5-325 | 18 days supply | Qty: 75 | Fill #0

## 2016-10-24 MED FILL — LEVOTHYROXINE 150 MCG TAB: 150 | 90 days supply | Qty: 90 | Fill #0

## 2016-10-24 NOTE — Progress Notes (Signed)
Subjective:  Patient ID: Tasha [name], female    DOB: 03-12-1960  Age: 56 y.o. MRN: [id number]  CC: Cough; Diabetes; and Hypothyroidism   HPI Tasha [name] presents for f/up - she complains of chronic, intermittent cough over the last year. She tells me that a month ago her cough worsened and was productive and she was seen at an urgent care center and she was told that her chest x-ray was positive for pneumonia. She was treated with an unspecified antibiotic. She also took a course of prednisone and used an inhaler which helped. She now tells me the symptoms are returning. She has had a few chills and night sweats but she denies fever or hemoptysis. She also complains of intermittent wheezing and shortness of breath. She tells me that years ago she saw a pulmonologist and was told that she had COPD. She has a history of being a light smoker and a remote history of exposure to an irritated.  Outpatient Medications Prior to Visit  Medication Sig Dispense Refill  . ALPRAZolam (XANAX) 0.5 MG tablet TAKE 1 TABLET BY MOUTH 4 TIMES DAILY AS NEEDED FOR ANXIETY 90 tablet 3  . aspirin EC 81 MG tablet Take 1 tablet (81 mg total) by mouth daily. 90 tablet 3  . Cholecalciferol 50000 UNITS TABS Take 1 tablet by mouth once a week. (Patient taking differently: Take 1 tablet by mouth every Monday. ) 12 tablet 3  . dapagliflozin propanediol (FARXIGA) 5 MG TABS tablet Take 5 mg by mouth daily. 90 tablet 0  . eszopiclone (LUNESTA) 2 MG TABS tablet TAKE 1 TABLET BY MOUTH ONCE DAILY AT BEDTIME AS NEEDED FOR SLEEP. TAKE IMMEDIATELY BEFORE BEDTIME 90 tablet 1  . ezetimibe (ZETIA) 10 MG tablet Take 1 tablet (10 mg total) by mouth daily. 90 tablet 3  . metFORMIN (GLUCOPHAGE) 1000 MG tablet Take 1 tablet (1,000 mg total) by mouth 2 (two) times daily with a meal. 180 tablet 0  . omeprazole (PRILOSEC) 40 MG capsule Take 2 capsules (80 mg total) by mouth daily. 60 capsule 11  . repaglinide (PRANDIN) 1 MG tablet Take 1 tablet  (1 mg total) by mouth 3 (three) times daily before meals. 90 tablet 5  . sertraline (ZOLOFT) 100 MG tablet Take 2 tablets (200 mg total) by mouth daily. 180 tablet 0  . buPROPion (WELLBUTRIN XL) 300 MG 24 hr tablet TAKE 1 TABLET BY MOUTH ONCE DAILY 30 tablet 11  . doxycycline (VIBRA-TABS) 100 MG tablet Take 1 tablet (100 mg total) by mouth 2 (two) times daily. 14 tablet 0  . HYDROcodone-acetaminophen (NORCO/VICODIN) 5-325 MG tablet Take 1 tablet by mouth every 6 (six) hours as needed. Not to exceed 4 tablets a day 75 tablet 0  . levothyroxine (SYNTHROID, LEVOTHROID) 200 MCG tablet Take 1 tablet (200 mcg total) by mouth daily before breakfast. 90 tablet 0  . meloxicam (MOBIC) 15 MG tablet Take 1 tablet (15 mg total) by mouth daily as needed for pain. 30 tablet 6   No facility-administered medications prior to visit.     ROS Review of Systems  Constitutional: Positive for chills, fatigue and unexpected weight change (6 # wt loss). Negative for activity change, appetite change, diaphoresis and fever.  HENT: Negative.  Negative for sinus pressure and trouble swallowing.   Eyes: Negative.  Negative for visual disturbance.  Respiratory: Positive for cough, shortness of breath and wheezing. Negative for choking, chest tightness and stridor.   Cardiovascular: Negative.  Negative for chest  pain, palpitations and leg swelling.  Gastrointestinal: Negative.  Negative for abdominal pain, blood in stool, constipation, diarrhea, nausea and vomiting.  Endocrine: Positive for heat intolerance. Negative for cold intolerance.  Genitourinary: Negative.  Negative for difficulty urinating.  Musculoskeletal: Negative.  Negative for back pain and myalgias.  Skin: Negative.  Negative for color change and rash.  Allergic/Immunologic: Negative.   Neurological: Negative.  Negative for dizziness, weakness and headaches.  Hematological: Negative for adenopathy. Does not bruise/bleed easily.  Psychiatric/Behavioral:  Positive for dysphoric mood and sleep disturbance. Negative for agitation, behavioral problems, decreased concentration, hallucinations, self-injury and suicidal ideas. The patient is nervous/anxious. The patient is not hyperactive.        She complains of jitteriness and extreme anxiety    Objective:  BP 130/80 (BP Location: Left Arm, Patient Position: Sitting, Cuff Size: Normal)   Pulse 72   Temp 98.1 F (36.7 C) (Oral)   Resp 16   Ht 5\' 7"  (1.702 m)   Wt 167 lb 8 oz (76 kg)   SpO2 98%   BMI 26.23 kg/m   BP Readings from Last 3 Encounters:  10/24/16 130/80  07/17/16 120/62  04/10/16 136/88    Wt Readings from Last 3 Encounters:  10/24/16 167 lb 8 oz (76 kg)  07/17/16 169 lb 4 oz (76.8 kg)  04/10/16 171 lb 2 oz (77.6 kg)    Physical Exam  Constitutional: She is oriented to person, place, and time.  Non-toxic appearance. She does not have a sickly appearance. She does not appear ill. No distress.  HENT:  Mouth/Throat: Oropharynx is clear and moist. No oropharyngeal exudate.  Eyes: Conjunctivae are normal. Right eye exhibits no discharge. Left eye exhibits no discharge. No scleral icterus.  Neck: Normal range of motion. Neck supple. No JVD present. No thyromegaly present.  Cardiovascular: Normal rate, regular rhythm and intact distal pulses.  Exam reveals no gallop and no friction rub.   No murmur heard. Pulmonary/Chest: Effort normal. No accessory muscle usage. No tachypnea. No respiratory distress. She has no decreased breath sounds. She has no wheezes. She has no rhonchi. She has rales in the right lower field. She exhibits no tenderness.  Abdominal: Soft. Bowel sounds are normal. She exhibits no distension and no mass. There is no tenderness. There is no rebound and no guarding.  Musculoskeletal: Normal range of motion. She exhibits no edema, tenderness or deformity.  Lymphadenopathy:    She has no cervical adenopathy.  Neurological: She is alert and oriented to person,  place, and time.  Skin: Skin is warm and dry. No rash noted. She is not diaphoretic. No erythema. No pallor.  Psychiatric: She has a normal mood and affect. Her behavior is normal. Judgment and thought content normal.  Vitals reviewed.   Lab Results  Component Value Date   WBC 7.5 04/10/2016   HGB 14.7 04/10/2016   HCT 43.7 04/10/2016   PLT 175.0 04/10/2016   GLUCOSE 160 (H) 10/24/2016   CHOL 231 (H) 04/10/2016   TRIG 182.0 (H) 04/10/2016   HDL 45.00 04/10/2016   LDLDIRECT 158.0 12/01/2013   LDLCALC 149 (H) 04/10/2016   ALT 53 (H) 10/24/2016   AST 36 10/24/2016   NA 138 10/24/2016   K 4.0 10/24/2016   CL 102 10/24/2016   CREATININE 0.79 10/24/2016   BUN 15 10/24/2016   CO2 26 10/24/2016   TSH <0.01 Repeated and verified X2. (L) 10/24/2016   HGBA1C 7.8 (H) 10/24/2016   MICROALBUR <0.7 10/24/2016  No results found.  Assessment & Plan:   [name] was seen today for cough, diabetes and hypothyroidism.  Diagnoses and all orders for this visit:  Acquired hypothyroidism- her TSH is undetectable. We'll decrease her levothyroxine dose from 200-150. -     TSH; Future -     levothyroxine (SYNTHROID, LEVOTHROID) 150 MCG tablet; Take 1 tablet (150 mcg total) by mouth daily.  Type 2 diabetes mellitus with complication, without long-term current use of insulin ([place])- her A1c is up to 7.8%. She will continue the current medical regimen and she agrees to improve her lifestyle modifications. -     Comprehensive metabolic panel; Future -     Hemoglobin A1c; Future -     Urinalysis, Routine w reflex microscopic; Future -     Microalbumin / creatinine urine ratio; Future  Hyperlipidemia with target LDL less than 130  Depression with anxiety- I'm concerned that Wellbutrin is overstimulating her side of asked her to decrease the dose from 300 today to 150 a day. -     buPROPion (WELLBUTRIN XL) 150 MG 24 hr tablet; Take 1 tablet (150 mg total) by mouth daily.  Cough- her FeNO score is  mildly elevated so will treat her for eosinophilic causes of cough by starting an ICS. I've asked her to undergo PFTs to confirm the diagnosis of COPD versus chronic bronchitis, versus asthma. -     DG Chest 2 View; Future -     Pulmonary Function Test; Future -     POCT EXHALED NITRIC OXIDE  Visit for screening mammogram -     MM DIGITAL SCREENING BILATERAL; Future  COPD (chronic obstructive pulmonary disease) with chronic bronchitis ([place])- I think she would benefit from triple tx. I gave her sample of trelegy and showed her how to use it. She demonstrated proficiency with its use. -     Fluticasone-Umeclidin-Vilant (TRELEGY ELLIPTA) 100-62.5-25 MCG/INH AEPB; Inhale 1 puff into the lungs daily.  Primary osteoarthritis of both knees -     HYDROcodone-acetaminophen (NORCO/VICODIN) 5-325 MG tablet; Take 1 tablet by mouth every 6 (six) hours as needed. Not to exceed 4 tablets a day  Need for influenza vaccination -     Flu Vaccine QUAD 36+ mos IM  Need for immunization against viral hepatitis -     Hepatitis A hepatitis B combined vaccine IM  Fatigue, unspecified type -     cyanocobalamin ((VITAMIN B-12)) injection 1,000 mcg; Inject 1 mL (1,000 mcg total) into the muscle once.  Encounter for long-term opiate analgesic use -     Pain Mgmt, Profile 8 w/Conf, U; Future   I have discontinued Ms. Holsomback's meloxicam, buPROPion, doxycycline, and levothyroxine. I am also having her start on buPROPion, Fluticasone-Umeclidin-Vilant, and levothyroxine. Additionally, I am having her maintain her Cholecalciferol, repaglinide, aspirin EC, ezetimibe, eszopiclone, ALPRAZolam, sertraline, metFORMIN, omeprazole, dapagliflozin propanediol, and HYDROcodone-acetaminophen. We administered cyanocobalamin.  Meds ordered this encounter  Medications  . buPROPion (WELLBUTRIN XL) 150 MG 24 hr tablet    Sig: Take 1 tablet (150 mg total) by mouth daily.    Dispense:  30 tablet    Refill:  2  .  Fluticasone-Umeclidin-Vilant (TRELEGY ELLIPTA) 100-62.5-25 MCG/INH AEPB    Sig: Inhale 1 puff into the lungs daily.    Dispense:  30 each    Refill:  11  . HYDROcodone-acetaminophen (NORCO/VICODIN) 5-325 MG tablet    Sig: Take 1 tablet by mouth every 6 (six) hours as needed. Not to exceed 4 tablets a day  Dispense:  75 tablet    Refill:  0  . cyanocobalamin ((VITAMIN B-12)) injection 1,000 mcg  . levothyroxine (SYNTHROID, LEVOTHROID) 150 MCG tablet    Sig: Take 1 tablet (150 mcg total) by mouth daily.    Dispense:  90 tablet    Refill:  0     Follow-up: Return in about 4 weeks (around 11/21/2016).  [name], MD

## 2016-10-24 NOTE — Patient Instructions (Signed)
Cough, Adult Coughing is a reflex that clears your throat and your airways. Coughing helps to heal and protect your lungs. It is normal to cough occasionally, but a cough that happens with other symptoms or lasts a long time may be a sign of a condition that needs treatment. A cough may last only 2-3 weeks (acute), or it may last longer than 8 weeks (chronic). What are the causes? Coughing is commonly caused by:  Breathing in substances that irritate your lungs.  A viral or bacterial respiratory infection.  Allergies.  Asthma.  Postnasal drip.  Smoking.  Acid backing up from the stomach into the esophagus (gastroesophageal reflux).  Certain medicines.  Chronic lung problems, including COPD (or rarely, lung cancer).  Other medical conditions such as heart failure.  Follow these instructions at home: Pay attention to any changes in your symptoms. Take these actions to help with your discomfort:  Take medicines only as told by your health care provider. ? If you were prescribed an antibiotic medicine, take it as told by your health care provider. Do not stop taking the antibiotic even if you start to feel better. ? Talk with your health care provider before you take a cough suppressant medicine.  Drink enough fluid to keep your urine clear or pale yellow.  If the air is dry, use a cold steam vaporizer or humidifier in your bedroom or your home to help loosen secretions.  Avoid anything that causes you to cough at work or at home.  If your cough is worse at night, try sleeping in a semi-upright position.  Avoid cigarette smoke. If you smoke, quit smoking. If you need help quitting, ask your health care provider.  Avoid caffeine.  Avoid alcohol.  Rest as needed.  Contact a health care provider if:  You have new symptoms.  You cough up pus.  Your cough does not get better after 2-3 weeks, or your cough gets worse.  You cannot control your cough with suppressant  medicines and you are losing sleep.  You develop pain that is getting worse or pain that is not controlled with pain medicines.  You have a fever.  You have unexplained weight loss.  You have night sweats. Get help right away if:  You cough up blood.  You have difficulty breathing.  Your heartbeat is very fast. This information is not intended to replace advice given to you by your health care provider. Make sure you discuss any questions you have with your health care provider. Document Released: 07/22/2010 Document Revised: 07/01/2015 Document Reviewed: 04/01/2014 Elsevier Interactive Patient Education  2017 [employer]  

## 2016-10-29 LAB — PAIN MGMT, PROFILE 8 W/CONF, U
6 Acetylmorphine: NEGATIVE ng/mL (ref <10)
Alcohol Metabolites: NEGATIVE ng/mL (ref <500)
Alphahydroxyalprazolam: 79 ng/mL — ABNORMAL HIGH (ref <25)
Alphahydroxymidazolam: NEGATIVE ng/mL (ref <50)
Alphahydroxytriazolam: NEGATIVE ng/mL (ref <50)
Aminoclonazepam: NEGATIVE ng/mL (ref <25)
Amphetamines: NEGATIVE ng/mL (ref <500)
Benzodiazepines: POSITIVE ng/mL — AB (ref <100)
Buprenorphine, Urine: NEGATIVE ng/mL (ref <5)
Cocaine Metabolite: NEGATIVE ng/mL (ref <150)
Creatinine: 87.7 mg/dL
Hydroxyethylflurazepam: NEGATIVE ng/mL (ref <50)
Lorazepam: NEGATIVE ng/mL (ref <50)
MDMA: NEGATIVE ng/mL (ref <500)
Marijuana Metabolite: NEGATIVE ng/mL (ref <20)
Nordiazepam: NEGATIVE ng/mL (ref <50)
Opiates: NEGATIVE ng/mL (ref <100)
Oxazepam: NEGATIVE ng/mL (ref <50)
Oxidant: NEGATIVE ug/mL (ref <200)
Oxycodone: NEGATIVE ng/mL (ref <100)
Temazepam: NEGATIVE ng/mL (ref <50)
pH: 5.9 (ref 4.5–9.0)

## 2016-11-17 ENCOUNTER — Other Ambulatory Visit: Payer: Self-pay | Admitting: Internal Medicine

## 2016-11-17 ENCOUNTER — Encounter: Payer: Self-pay | Admitting: *Deleted

## 2016-11-17 DIAGNOSIS — F418 Other specified anxiety disorders: Secondary | ICD-10-CM

## 2016-11-17 MED FILL — EZETIMIBE 10 MG TABLET: 10 | 30 days supply | Qty: 30 | Fill #7 | Status: TO

## 2016-11-17 MED FILL — OMEPRAZOLE DR 40 MG CAPSULE: 40 | 30 days supply | Qty: 60 | Fill #1

## 2016-11-17 MED FILL — buPROPion HCL ER (XL) 150 M: 150 | 30 days supply | Qty: 30 | Fill #0

## 2016-11-17 MED FILL — SERTRALINE HCL 100 MG TAB: 100 | 30 days supply | Qty: 60 | Fill #1

## 2016-11-17 MED FILL — ASPIRIN ADULT LOW STRENGTH: 81 | 30 days supply | Qty: 30 | Fill #7 | Status: TO

## 2016-11-17 MED FILL — metFORMIN HCL 1000 MG TABS: 1000 | 30 days supply | Qty: 60 | Fill #1

## 2016-11-20 ENCOUNTER — Telehealth: Payer: Self-pay | Admitting: Internal Medicine

## 2016-11-20 NOTE — Telephone Encounter (Signed)
Faxed script back to [facility]...Johny Chess

## 2016-11-20 NOTE — Telephone Encounter (Signed)
Pt informed and send patient assistance information.   Samples placed up front as well.

## 2016-11-20 NOTE — Telephone Encounter (Signed)
Pharmacy called and the patient does not have any pharmacy coverage, the patient cannot afford  dapagliflozin propanediol (FARXIGA) 5 MG TABS tablet and would like a cheaper alternative sent in, pharmacists stated all the alternatives are pretty expensive they would ike to know if we have coupons or manufacturer assistance.  Please advise

## 2016-11-21 NOTE — Telephone Encounter (Signed)
I have form for patient too.

## 2016-11-23 ENCOUNTER — Encounter: Payer: Self-pay | Admitting: Internal Medicine

## 2016-11-23 ENCOUNTER — Ambulatory Visit (INDEPENDENT_AMBULATORY_CARE_PROVIDER_SITE_OTHER): Payer: Medicare Other | Admitting: Internal Medicine

## 2016-11-23 ENCOUNTER — Ambulatory Visit: Payer: Medicare Other

## 2016-11-23 VITALS — BP 130/70 | HR 71 | Temp 97.8°F | Ht 67.0 in | Wt 168.0 lb

## 2016-11-23 DIAGNOSIS — Z23 Encounter for immunization: Secondary | ICD-10-CM | POA: Diagnosis not present

## 2016-11-23 DIAGNOSIS — I70219 Atherosclerosis of native arteries of extremities with intermittent claudication, unspecified extremity: Secondary | ICD-10-CM

## 2016-11-23 DIAGNOSIS — M170 Bilateral primary osteoarthritis of knee: Secondary | ICD-10-CM

## 2016-11-23 MED ORDER — HYDROCODONE-ACETAMINOPHEN 5-325 MG PO TABS: 1.0000 | ORAL_TABLET | Freq: Four times a day (QID) | ORAL | 0 refills | Status: DC | PRN

## 2016-11-23 NOTE — Patient Instructions (Signed)
Type 2 Diabetes Mellitus, Diagnosis, Adult Type 2 diabetes (type 2 diabetes mellitus) is a long-term (chronic) disease. It may be caused by one or both of these problems:  Your body does not make enough of a hormone called insulin.  Your body does not react in a normal way to insulin that it makes.  Insulin lets sugars (glucose) go into cells in the body. This gives you energy. If you have type 2 diabetes, sugars cannot get into cells. This causes high blood sugar (hyperglycemia). Your doctor will set treatment goals for you. Generally, you should have these blood sugar levels:  Before meals (preprandial): 80-130 mg/dL (4.4-7.2 mmol/L).  After meals (postprandial): below 180 mg/dL (10 mmol/L).  A1c (hemoglobin A1c) level: less than 7%.  Follow these instructions at home: Questions to Ask Your Doctor  You may want to ask these questions:  Do I need to meet with a diabetes educator?  Where can I find a support group for people with diabetes?  What equipment will I need to care for myself at home?  What diabetes medicines do I need? When should I take them?  How often do I need to check my blood sugar?  What number can I call if I have questions?  When is my next doctor's visit?  General instructions  Take over-the-counter and prescription medicines only as told by your doctor.  Keep all follow-up visits as told by your doctor. This is important. Contact a doctor if:  Your blood sugar is at or above 240 mg/dL (13.3 mmol/L) for 2 days in a row.  You have been sick or have had a fever for 2 days or more and you are not getting better.  You have any of these problems for more than 6 hours: ? You cannot eat or drink. ? You feel sick to your stomach (nauseous). ? You throw up (vomit). ? You have watery poop (diarrhea). Get help right away if:  Your blood sugar is lower than 54 mg/dL (3 mmol/L).  You get confused.  You have trouble: ? Thinking  clearly. ? Breathing.  You have moderate or large ketone levels in your pee (urine). This information is not intended to replace advice given to you by your health care provider. Make sure you discuss any questions you have with your health care provider. Document Released: 11/02/2007 Document Revised: 07/01/2015 Document Reviewed: 02/26/2015 Elsevier Interactive Patient Education  2018 [employer] Inc.   

## 2016-11-23 NOTE — Progress Notes (Signed)
Subjective:  Patient ID: Tasha [name], female    DOB: 07-Jun-1960  Age: 56 y.o. MRN: [id number]  CC: Osteoarthritis   HPI Tasha [name] presents for the complaint of pain in her large joints (knees and shoulders) which has been treated with norco, she requests a refill.   Outpatient Medications Prior to Visit  Medication Sig Dispense Refill  . ALPRAZolam (XANAX) 0.5 MG tablet TAKE 1 TABLET BY MOUTH 4 TIMES DAILY AS NEEDED FOR ANXIETY 90 tablet 3  . aspirin EC 81 MG tablet Take 1 tablet (81 mg total) by mouth daily. 90 tablet 3  . buPROPion (WELLBUTRIN XL) 150 MG 24 hr tablet Take 1 tablet (150 mg total) by mouth daily. 30 tablet 2  . Cholecalciferol 50000 UNITS TABS Take 1 tablet by mouth once a week. (Patient taking differently: Take 1 tablet by mouth every Monday. ) 12 tablet 3  . dapagliflozin propanediol (FARXIGA) 5 MG TABS tablet Take 5 mg by mouth daily. 90 tablet 0  . eszopiclone (LUNESTA) 2 MG TABS tablet TAKE 1 TABLET BY MOUTH ONCE DAILY AT BEDTIME AS NEEDED FOR SLEEP. TAKE IMMEDIATELY BEFORE BEDTIME 90 tablet 1  . ezetimibe (ZETIA) 10 MG tablet Take 1 tablet (10 mg total) by mouth daily. 90 tablet 3  . Fluticasone-Umeclidin-Vilant (TRELEGY ELLIPTA) 100-62.5-25 MCG/INH AEPB Inhale 1 puff into the lungs daily. 30 each 11  . levothyroxine (SYNTHROID, LEVOTHROID) 150 MCG tablet Take 1 tablet (150 mcg total) by mouth daily. 90 tablet 0  . metFORMIN (GLUCOPHAGE) 1000 MG tablet Take 1 tablet (1,000 mg total) by mouth 2 (two) times daily with a meal. 180 tablet 0  . omeprazole (PRILOSEC) 40 MG capsule Take 2 capsules (80 mg total) by mouth daily. 60 capsule 11  . repaglinide (PRANDIN) 1 MG tablet Take 1 tablet (1 mg total) by mouth 3 (three) times daily before meals. 90 tablet 5  . sertraline (ZOLOFT) 100 MG tablet Take 2 tablets (200 mg total) by mouth daily. 180 tablet 0  . HYDROcodone-acetaminophen (NORCO/VICODIN) 5-325 MG tablet Take 1 tablet by mouth every 6 (six) hours as needed. Not  to exceed 4 tablets a day 75 tablet 0   No facility-administered medications prior to visit.     ROS Review of Systems  Constitutional: Negative for diaphoresis and fatigue.  HENT: Negative.   Eyes: Negative for visual disturbance.  Respiratory: Negative for cough, chest tightness, shortness of breath and wheezing.   Cardiovascular: Negative for chest pain, palpitations and leg swelling.  Gastrointestinal: Negative for abdominal pain, constipation, diarrhea, nausea and rectal pain.  Endocrine: Negative.   Genitourinary: Negative.  Negative for difficulty urinating.  Musculoskeletal: Positive for arthralgias. Negative for joint swelling and myalgias.  Skin: Negative.  Negative for color change and rash.  Neurological: Negative.  Negative for dizziness and weakness.  Hematological: Negative for adenopathy. Does not bruise/bleed easily.  Psychiatric/Behavioral: Negative.     Objective:  BP 130/70 (BP Location: Right Arm, Patient Position: Sitting, Cuff Size: Large)   Pulse 71   Temp 97.8 F (36.6 C) (Oral)   Ht 5\' 7"  (1.702 m)   Wt 168 lb (76.2 kg)   SpO2 97%   BMI 26.31 kg/m   BP Readings from Last 3 Encounters:  11/23/16 130/70  10/24/16 130/80  07/17/16 120/62    Wt Readings from Last 3 Encounters:  11/23/16 168 lb (76.2 kg)  10/24/16 167 lb 8 oz (76 kg)  07/17/16 169 lb 4 oz (76.8 kg)  Physical Exam  Constitutional: She is oriented to person, place, and time. No distress.  HENT:  Mouth/Throat: Oropharynx is clear and moist. No oropharyngeal exudate.  Eyes: Conjunctivae are normal. Right eye exhibits no discharge. Left eye exhibits no discharge. No scleral icterus.  Neck: Normal range of motion. Neck supple. No JVD present. No thyromegaly present.  Cardiovascular: Normal rate, regular rhythm and intact distal pulses.  Exam reveals no gallop and no friction rub.   No murmur heard. Pulmonary/Chest: Effort normal and breath sounds normal. No respiratory distress.  She has no wheezes. She has no rales. She exhibits no tenderness.  Abdominal: Soft. Bowel sounds are normal. She exhibits no distension and no mass. There is no tenderness. There is no rebound and no guarding.  Musculoskeletal: Normal range of motion. She exhibits no edema, tenderness or deformity.  Lymphadenopathy:    She has no cervical adenopathy.  Neurological: She is alert and oriented to person, place, and time.  Skin: Skin is warm and dry. No rash noted. She is not diaphoretic. No erythema. No pallor.  Vitals reviewed.   Lab Results  Component Value Date   WBC 7.5 04/10/2016   HGB 14.7 04/10/2016   HCT 43.7 04/10/2016   PLT 175.0 04/10/2016   GLUCOSE 160 (H) 10/24/2016   CHOL 231 (H) 04/10/2016   TRIG 182.0 (H) 04/10/2016   HDL 45.00 04/10/2016   LDLDIRECT 158.0 12/01/2013   LDLCALC 149 (H) 04/10/2016   ALT 53 (H) 10/24/2016   AST 36 10/24/2016   NA 138 10/24/2016   K 4.0 10/24/2016   CL 102 10/24/2016   CREATININE 0.79 10/24/2016   BUN 15 10/24/2016   CO2 26 10/24/2016   TSH <0.01 Repeated and verified X2. (L) 10/24/2016   HGBA1C 7.8 (H) 10/24/2016   MICROALBUR <0.7 10/24/2016    Dg Chest 2 View  Result Date: 10/24/2016 CLINICAL DATA:  Cough, wheezing, some chest pain, smoking his EXAM: CHEST  2 VIEW COMPARISON:  Chest x-ray of 10/13/2015 FINDINGS: No active infiltrate or effusion is seen. There is mild peribronchial thickening which may indicate bronchitis. Mediastinal and hilar contours are unremarkable. The heart is within normal limits in size. There are mild degenerative changes in the lower thoracic spine. IMPRESSION: No pneumonia or effusion.  Question mild bronchitis. Electronically Signed   By: Ivar Drape M.D.   On: 10/24/2016 09:38    Assessment & Plan:   Tasha [name] was seen today for osteoarthritis.  Diagnoses and all orders for this visit:  Need for immunization against viral hepatitis -     Hepatitis A hepatitis B combined vaccine IM  Primary  osteoarthritis of both knees -     Discontinue: HYDROcodone-acetaminophen (NORCO/VICODIN) 5-325 MG tablet; Take 1 tablet by mouth every 6 (six) hours as needed. Not to exceed 4 tablets a day -     Discontinue: HYDROcodone-acetaminophen (NORCO/VICODIN) 5-325 MG tablet; Take 1 tablet by mouth every 6 (six) hours as needed. Not to exceed 4 tablets a day -     HYDROcodone-acetaminophen (NORCO/VICODIN) 5-325 MG tablet; Take 1 tablet by mouth every 6 (six) hours as needed. Not to exceed 4 tablets a day   I am having Ms. Favila maintain her Cholecalciferol, repaglinide, aspirin EC, ezetimibe, eszopiclone, sertraline, metFORMIN, omeprazole, dapagliflozin propanediol, buPROPion, Fluticasone-Umeclidin-Vilant, levothyroxine, ALPRAZolam, and HYDROcodone-acetaminophen.  Meds ordered this encounter  Medications  . DISCONTD: HYDROcodone-acetaminophen (NORCO/VICODIN) 5-325 MG tablet    Sig: Take 1 tablet by mouth every 6 (six) hours as needed. Not to exceed 4  tablets a day    Dispense:  75 tablet    Refill:  0  . DISCONTD: HYDROcodone-acetaminophen (NORCO/VICODIN) 5-325 MG tablet    Sig: Take 1 tablet by mouth every 6 (six) hours as needed. Not to exceed 4 tablets a day    Dispense:  75 tablet    Refill:  0  . HYDROcodone-acetaminophen (NORCO/VICODIN) 5-325 MG tablet    Sig: Take 1 tablet by mouth every 6 (six) hours as needed. Not to exceed 4 tablets a day    Dispense:  75 tablet    Refill:  0     Follow-up: Return in about 3 months (around 02/23/2017).  [name], MD

## 2016-12-04 MED FILL — ALPRAZolam 0.5 MG TABS: 0.5 | 15 days supply | Qty: 90 | Fill #0

## 2016-12-04 MED FILL — HYDROCODON-APAP 5-325: 5-325 | 18 days supply | Qty: 75 | Fill #0

## 2017-01-16 ENCOUNTER — Other Ambulatory Visit: Payer: Self-pay

## 2017-01-16 ENCOUNTER — Other Ambulatory Visit: Payer: Self-pay | Admitting: Internal Medicine

## 2017-01-16 ENCOUNTER — Telehealth: Payer: Self-pay | Admitting: Internal Medicine

## 2017-01-16 ENCOUNTER — Emergency Department (HOSPITAL_COMMUNITY)
Admission: EM | Admit: 2017-01-16 | Discharge: 2017-01-16 | Disposition: A | Discharge status: Home / Self Care | Payer: Medicare Other | Attending: Emergency Medicine | Admitting: Emergency Medicine

## 2017-01-16 ENCOUNTER — Encounter (HOSPITAL_COMMUNITY): Payer: Self-pay | Admitting: Emergency Medicine

## 2017-01-16 DIAGNOSIS — E039 Hypothyroidism, unspecified: Secondary | ICD-10-CM

## 2017-01-16 DIAGNOSIS — Z5321 Procedure and treatment not carried out due to patient leaving prior to being seen by health care provider: Secondary | ICD-10-CM | POA: Insufficient documentation

## 2017-01-16 DIAGNOSIS — M6281 Muscle weakness (generalized): Secondary | ICD-10-CM | POA: Insufficient documentation

## 2017-01-16 DIAGNOSIS — R51 Headache: Secondary | ICD-10-CM | POA: Insufficient documentation

## 2017-01-16 MED FILL — OMEPRAZOLE DR 40 MG CAPSULE: 40 | 30 days supply | Qty: 60 | Fill #2

## 2017-01-16 MED FILL — LEVOTHYROXINE 150 MCG TAB: 150 | 90 days supply | Qty: 90 | Fill #0

## 2017-01-16 MED FILL — buPROPion HCL ER (XL) 150 M: 150 | 30 days supply | Qty: 30 | Fill #1

## 2017-01-16 MED FILL — SERTRALINE HCL 100 MG TAB: 100 | 30 days supply | Qty: 60 | Fill #2

## 2017-01-16 MED FILL — ALPRAZolam 0.5 MG TABS: 0.5 | 22 days supply | Qty: 90 | Fill #1

## 2017-01-16 MED FILL — metFORMIN HCL 1000 MG TABS: 1000 | 30 days supply | Qty: 60 | Fill #2

## 2017-01-16 NOTE — ED Notes (Signed)
PA Ward advised no orders needed at this time

## 2017-01-16 NOTE — Telephone Encounter (Signed)
Started as a refill call, pt went to ED.

## 2017-01-16 NOTE — ED Triage Notes (Addendum)
Pt reports posterior headache and generalized weakness that began today around 1200, pt reports taking 2 xanax over the course of a few hours as well as a hydrocodone. Pt a/ox4, resp e/u. Grip strength equal bilaterally, face symmetrical, pt reports she feels like her speech may be off but husband states it sounds normal to him.

## 2017-01-16 NOTE — Telephone Encounter (Signed)
°  Patient Name: Tasha [name]  DOB: October 08, 1960    Initial Comment Caller states, wife is a pt. out of thyroid rx, and needing to refilled.    Nurse Assessment  Nurse: Joline Salt, RN, Malachy Mood Date/Time Eilene Ghazi Time): 01/16/2017 2:53:05 PM  Confirm and document reason for call. If symptomatic, describe symptoms. ---Caller states she has an extremely bad headache which started today.  Does the patient have any new or worsening symptoms? ---Yes  Will a triage be completed? ---Yes  Related visit to physician within the last 2 weeks? ---N/A  Does the PT have any chronic conditions? (i.e. diabetes, asthma, etc.) ---Yes  List chronic conditions. ---diabetes  Is this a behavioral health or substance abuse call? ---No    Nurse: Joline Salt, RN, Malachy Mood Date/Time (Eastern Time): 01/16/2017 2:48:02 PM  Please select the assessment type ---Refill   Additional Documentation ---Caller states she is out of thyroid medication 150 mcg 1 daily   Does the patient have enough medication to last until the office opens? ---No   Additional Documentation ---[name]      Guidelines    Guideline Title Affirmed Question Affirmed Notes  Headache Difficult to awaken or acting confused (e.g., disoriented, slurred speech)    Final Disposition User   Call EMS 911 Now [place], [place] - ED   Caller Disagree/Comply Comply  Caller Understands Yes  PreDisposition [place], RN, Malachy Mood Reason: Caller states she is having severe headache with disorientation, slurred speech, weakness and numbness on one side. Upon call back husband states he is taking her to the ED instead of calling 911. They are on the way now [facility] Joline Salt, RN, Malachy Mood 01/16/2017 3:24:58 PM Pharmacy Call Joline Salt, RN, Malachy Mood Reason: [facility] ;[place], [place], [place] [address]; [phone]  Verbal order, read back to call in Levothyroxine 144mg, Take 1 tab po daily. Dispense 30 tabs, no refill. Follow up with Dr. JRonnald Ramp Dr JKathlene November [phone]

## 2017-01-16 NOTE — ED Notes (Signed)
PA Ward made aware of patient and in to assess at this time.

## 2017-01-17 ENCOUNTER — Other Ambulatory Visit: Payer: Self-pay | Admitting: Internal Medicine

## 2017-01-17 NOTE — Telephone Encounter (Signed)
FYI: [place], RN, Malachy Mood Reason: Caller states she is having severe headache with disorientation, slurred speech, weakness and numbness on one side. Upon call back husband states he is taking her to the ED instead of calling 911. They are on the way now

## 2017-02-02 ENCOUNTER — Telehealth: Payer: Self-pay | Admitting: Internal Medicine

## 2017-02-02 ENCOUNTER — Other Ambulatory Visit: Payer: Self-pay | Admitting: Internal Medicine

## 2017-02-02 DIAGNOSIS — M170 Bilateral primary osteoarthritis of knee: Secondary | ICD-10-CM

## 2017-02-02 MED ORDER — HYDROCODONE-ACETAMINOPHEN 5-325 MG PO TABS: 1.0000 | ORAL_TABLET | Freq: Four times a day (QID) | ORAL | 0 refills | Status: DC | PRN

## 2017-02-02 NOTE — Telephone Encounter (Signed)
Hydrocodone refill. Lat refill on 11/23/16. Last OV 10/24/16.

## 2017-02-02 NOTE — Telephone Encounter (Signed)
Pt may have samples. We have plenty of both strengths.

## 2017-02-02 NOTE — Telephone Encounter (Signed)
Pt requesting samples of Farxiga, that she stated would be given to her by Dr. Ronnald Ramp. Pt states she was told to contact office when she runs out of medication and she would be provided with samples because the medication was expensive.

## 2017-02-02 NOTE — Telephone Encounter (Signed)
Copied from [place] [id number]. Topic: Quick Communication - Rx Refill/Question >> Feb 02, 2017  1:08 PM Yvette Rack wrote: Has the patient contacted their pharmacy? No.   (Agent: If no, request that the patient contact the pharmacy for the refill.) HYDROcodone-acetaminophen (NORCO/VICODIN) 5-325 MG tablet  [place], [place] [phone] (Phone) [phone] (Fax)   Preferred Pharmacy (with phone number or street name):    Agent: Please be advised that RX refills may take up to 3 business days. We ask that you follow-up with your pharmacy.

## 2017-02-02 NOTE — Telephone Encounter (Signed)
Copied from [place] [id number]. Topic: Quick Communication - See Telephone Encounter >> Feb 02, 2017 10:50 AM Corie Chiquito, NT wrote: CRM for notification. See Telephone encounter for: Patient calling because she needs a refill on her [place]. Stated that she has been out of this medication for three days. If someone could give her a call back at [phone]  02/02/17.

## 2017-02-05 MED FILL — ALPRAZolam 0.5 MG TABS: 0.5 | 22 days supply | Qty: 90 | Fill #2

## 2017-02-05 MED FILL — HYDROCODON-APAP 5-325: 5-325 | 19 days supply | Qty: 75 | Fill #0

## 2017-02-05 NOTE — Telephone Encounter (Signed)
Patient will probably be in before close today to pick up samples.

## 2017-02-05 NOTE — Telephone Encounter (Signed)
Patients husband came by and picked up the samples.

## 2017-02-16 ENCOUNTER — Ambulatory Visit (INDEPENDENT_AMBULATORY_CARE_PROVIDER_SITE_OTHER): Payer: Private Health Insurance - Indemnity | Admitting: Family Medicine

## 2017-02-16 ENCOUNTER — Encounter: Payer: Self-pay | Admitting: Family Medicine

## 2017-02-16 VITALS — BP 138/88 | HR 74 | Temp 98.0°F | Ht 67.0 in | Wt 171.8 lb

## 2017-02-16 DIAGNOSIS — R079 Chest pain, unspecified: Secondary | ICD-10-CM | POA: Diagnosis not present

## 2017-02-16 DIAGNOSIS — M542 Cervicalgia: Secondary | ICD-10-CM | POA: Diagnosis not present

## 2017-02-16 MED ORDER — CYCLOBENZAPRINE HCL 10 MG PO TABS
10.0000 mg | ORAL_TABLET | Freq: Three times a day (TID) | ORAL | 0 refills | Status: DC | PRN
Start: 2017-02-16 — End: 2017-08-30

## 2017-02-16 MED FILL — CYCLOBENZAPRINE 10 MG TAB: 10 | 10 days supply | Qty: 30 | Fill #0

## 2017-02-16 NOTE — Patient Instructions (Signed)
Start the flexeril.  We will get a CT scan to make sure there isnt anything else going on.  Take care, Dr Jerline Pain

## 2017-02-16 NOTE — Progress Notes (Signed)
    Subjective:  Tasha [name] is a 57 y.o. female who presents today with a chief complaint of left neck pain.   HPI:  Neck Pain, acute issue Symptoms started yesterday morning.  Worsened over that time.  Pain located in the left lateral aspect of her neck will occasionally radiate into the top of her head as well as into her chest.  No cough or sneeze.  No fevers or chills.  Pain is worse with movement.  No history of trauma or other obvious precipitating events.  She has tried Tylenol which did not help very much.  She is also felt some masses in the area.  Also reports occasional sensations like her chest is fluttering her last couple of days.  No shortness of breath.  This symptom seems to occur randomly -nonexertional.  ROS: Per HPI  PMH: She reports that she has been smoking cigarettes.  She has been smoking about 1.00 pack per day. she has never used smokeless tobacco. She reports that she does not drink alcohol or use drugs.    Objective:  Physical Exam: BP 138/88 (BP Location: Left Arm, Patient Position: Sitting, Cuff Size: Normal)   Pulse 74   Temp 98 F (36.7 C) (Oral)   Ht 5\' 7"  (1.702 m)   Wt 171 lb 12.8 oz (77.9 kg)   SpO2 97%   BMI 26.91 kg/m   Gen: NAD, resting comfortably HEENT: -Neck: No deformities.  Tender to palpation along anterior edge of left SCM muscle group.  She does have a few tender masses along her SCM-likely reactive lymph nodes.  Full range of motion.  Pain is worse with rotation of head from right to left. CV: RRR with no murmurs appreciated Pulm: NWOB, CTAB with no crackles, wheezes, or rhonchi MSK: Upper extremities with 5 out of 5 strength bilaterally.  Biceps reflexes 2+ and symmetric bilaterally.  EKG: Normal sinus rhythm.  No acute ischemic changes.  Ventricular rate 63.  Assessment/Plan:  Neck pain No red flag signs or symptoms. Given her tenderness along her SCM, as well as worsen pain with movement, this likely represents muscular  strain.  We will start Flexeril. Will avoid NSAIDs at this time given her history of GERD and increased risk for cardiac disease. Patient very concerned about her enlarged lymph nodes. She is at a moderately increased risk for cancer due to her smoking status. We will order a CT neck to further evaluate. Discussed reasons to return to care.  Chest Pain Her EKG today does not have any signs of ischemic changes and she is not having any current chest pain.  Her history is more consistent with referred pain from her neck-doubt that it is cardiac, however she has increased risk with all of her risk factors.  Advised her to follow-up with her PCP for further discussion and possible cardiology referral for stress testing.  Algis Greenhouse. Jerline Pain, MD 02/16/2017 1:12 PM

## 2017-02-19 MED FILL — buPROPion HCL ER (XL) 150 M: 150 | 30 days supply | Qty: 30 | Fill #2

## 2017-02-20 NOTE — Telephone Encounter (Signed)
LM for patient to return call.  CRM created.

## 2017-02-20 NOTE — Telephone Encounter (Signed)
Initial comment:  Patient states she thinks that Dr. Jerline Pain thought she was "after pain medication." reports throat still feels the same way on the left side, Dr. Jerline Pain reports that it is a pulled muscle, "doesn't believe him." Reports wants antibiotic.  PER [facility]

## 2017-02-20 NOTE — Telephone Encounter (Signed)
Patient did not have any signs of infection during our visit - not sure why she wants an antibiotic. She did not mention anything about antibiotics during our office visit.   Needs to follow up with PCP.  Algis Greenhouse. Jerline Pain, MD 02/20/2017 12:01 PM

## 2017-02-21 ENCOUNTER — Other Ambulatory Visit: Payer: Self-pay

## 2017-02-21 DIAGNOSIS — E118 Type 2 diabetes mellitus with unspecified complications: Secondary | ICD-10-CM

## 2017-02-23 NOTE — Telephone Encounter (Signed)
Unable to reach patient again

## 2017-02-27 ENCOUNTER — Inpatient Hospital Stay: Admission: RE | Admit: 2017-02-27 | Payer: Medicare Other | Source: Ambulatory Visit

## 2017-03-09 ENCOUNTER — Inpatient Hospital Stay: Admission: RE | Admit: 2017-03-09 | Payer: Medicare Other | Source: Ambulatory Visit

## 2017-03-14 ENCOUNTER — Telehealth: Payer: Self-pay | Admitting: Internal Medicine

## 2017-03-14 DIAGNOSIS — E039 Hypothyroidism, unspecified: Secondary | ICD-10-CM

## 2017-03-14 NOTE — Telephone Encounter (Signed)
Notified pt w/MD response made appt for Mon 2/11 @ 10:15.Marland KitchenJohny [name]

## 2017-03-14 NOTE — Telephone Encounter (Signed)
MD is out of office today will hold until he return back in the office tomorrow for his response...Johny Chess

## 2017-03-14 NOTE — Telephone Encounter (Signed)
Copied from [place] [id number]. Topic: Quick Communication - Rx Refill/Question >> Mar 14, 2017  8:19 AM Margot Ables wrote: Pt states she is out of her dapagliflozin propanediol (FARXIGA) 5 MG TABS tablet and Dr. Ronnald Ramp has been giving it to her because she cannot afford it ($500 from drug store). Please advise. Pt states thyroid med was changed and she feels very tired since it was changed. Please advise.  Pt states no contact with pharmacy regarding these meds. Pharmacy: [place], [place] - [place] [phone] (Phone) [phone] (Fax)

## 2017-03-14 NOTE — Addendum Note (Signed)
Addended by: Earnstine Regal on: 03/14/2017 01:47 PM   Modules accepted: Orders

## 2017-03-14 NOTE — Telephone Encounter (Signed)
pls come in for an OV and TSH level

## 2017-03-15 ENCOUNTER — Other Ambulatory Visit: Payer: Self-pay | Admitting: Internal Medicine

## 2017-03-15 DIAGNOSIS — E118 Type 2 diabetes mellitus with unspecified complications: Secondary | ICD-10-CM

## 2017-03-15 DIAGNOSIS — F418 Other specified anxiety disorders: Secondary | ICD-10-CM

## 2017-03-15 MED FILL — SERTRALINE HCL 100 MG TAB: 100 | 90 days supply | Qty: 180 | Fill #0

## 2017-03-15 MED FILL — OMEPRAZOLE DR 40 MG CAPSULE: 40 | 30 days supply | Qty: 60 | Fill #3

## 2017-03-15 MED FILL — ALPRAZolam 0.5 MG TABS: 0.5 | 23 days supply | Qty: 90 | Fill #3

## 2017-03-15 MED FILL — metFORMIN HCL 1000 MG TABS: 1000 | 90 days supply | Qty: 180 | Fill #0

## 2017-03-19 ENCOUNTER — Ambulatory Visit (INDEPENDENT_AMBULATORY_CARE_PROVIDER_SITE_OTHER): Payer: Private Health Insurance - Indemnity | Admitting: Internal Medicine

## 2017-03-19 ENCOUNTER — Encounter: Payer: Self-pay | Admitting: Internal Medicine

## 2017-03-19 ENCOUNTER — Other Ambulatory Visit (INDEPENDENT_AMBULATORY_CARE_PROVIDER_SITE_OTHER): Payer: Private Health Insurance - Indemnity

## 2017-03-19 VITALS — BP 130/70 | HR 67 | Temp 98.5°F | Ht 67.0 in | Wt 171.0 lb

## 2017-03-19 DIAGNOSIS — E039 Hypothyroidism, unspecified: Secondary | ICD-10-CM | POA: Diagnosis not present

## 2017-03-19 DIAGNOSIS — F418 Other specified anxiety disorders: Secondary | ICD-10-CM | POA: Diagnosis not present

## 2017-03-19 DIAGNOSIS — M170 Bilateral primary osteoarthritis of knee: Secondary | ICD-10-CM

## 2017-03-19 DIAGNOSIS — Z23 Encounter for immunization: Secondary | ICD-10-CM | POA: Diagnosis not present

## 2017-03-19 DIAGNOSIS — R413 Other amnesia: Secondary | ICD-10-CM | POA: Diagnosis not present

## 2017-03-19 DIAGNOSIS — Z794 Long term (current) use of insulin: Secondary | ICD-10-CM

## 2017-03-19 DIAGNOSIS — F5101 Primary insomnia: Secondary | ICD-10-CM

## 2017-03-19 DIAGNOSIS — Z79891 Long term (current) use of opiate analgesic: Secondary | ICD-10-CM

## 2017-03-19 DIAGNOSIS — E118 Type 2 diabetes mellitus with unspecified complications: Secondary | ICD-10-CM

## 2017-03-19 LAB — POCT GLYCOSYLATED HEMOGLOBIN (HGB A1C): Hemoglobin A1C: 7.2

## 2017-03-19 LAB — BASIC METABOLIC PANEL
BUN: 11 mg/dL (ref 6–23)
CO2: 26 mEq/L (ref 19–32)
Calcium: 9.3 mg/dL (ref 8.4–10.5)
Chloride: 104 mEq/L (ref 96–112)
Creatinine, Ser: 0.69 mg/dL (ref 0.40–1.20)
GFR: 92.88 mL/min (ref >60.00)
Glucose, Bld: 174 mg/dL — ABNORMAL HIGH (ref 70–99)
Potassium: 3.8 mEq/L (ref 3.5–5.1)
Sodium: 140 mEq/L (ref 135–145)

## 2017-03-19 LAB — TSH: TSH: 0.25 u[IU]/mL — ABNORMAL LOW (ref 0.35–4.50)

## 2017-03-19 LAB — POCT GLUCOSE (DEVICE FOR HOME USE): Glucose Fasting, POC: 197 mg/dL — AB (ref 70–99)

## 2017-03-19 MED ORDER — HYDROCODONE-ACETAMINOPHEN 5-325 MG PO TABS: 1.0000 | ORAL_TABLET | Freq: Four times a day (QID) | ORAL | 0 refills | Status: DC | PRN

## 2017-03-19 MED ORDER — BUPROPION HCL ER (XL) 150 MG PO TB24: 150.0000 mg | ORAL_TABLET | Freq: Every day | ORAL | 1 refills | Status: DC

## 2017-03-19 MED ORDER — TRAZODONE HCL 100 MG PO TABS: 100.0000 mg | ORAL_TABLET | Freq: Every day | ORAL | 1 refills | Status: DC

## 2017-03-19 MED ORDER — LEVOTHYROXINE SODIUM 125 MCG PO TABS: 125.0000 ug | ORAL_TABLET | Freq: Every day | ORAL | 0 refills | Status: DC

## 2017-03-19 MED ORDER — SEMAGLUTIDE(0.25 OR 0.5MG/DOS) 2 MG/1.5ML ~~LOC~~ SOPN: 0.5000 mg | PEN_INJECTOR | SUBCUTANEOUS | 1 refills | Status: DC

## 2017-03-19 MED FILL — HYDROCODON-APAP 5-325: 5-325 | 18 days supply | Qty: 75 | Fill #0

## 2017-03-19 MED FILL — traZODone HCL 100 MG TABS: 100 | 90 days supply | Qty: 90 | Fill #0

## 2017-03-19 MED FILL — buPROPion HCL ER (XL) 150 M: 150 | 90 days supply | Qty: 90 | Fill #0

## 2017-03-19 NOTE — Patient Instructions (Signed)

## 2017-03-19 NOTE — Progress Notes (Signed)
Subjective:  Patient ID: Tasha [name], female    DOB: Dec 07, 1960  Age: 57 y.o. MRN: [id number]  CC: Hypothyroidism and Diabetes   HPI MIKIAH DURALL presents for f/up - she complains of feeling like she is in a mental fog with forgetfulness and declining memory.  She also complains of anxiety, insomnia, alternating constipation and diarrhea, increased thirst and decreased urine output.  She stopped taking Wellbutrin but feels like her symptoms have worsened since she stopped taking it and she wants to start it again.  She stopped taking [place] because she developed a vaginal irritation that sounds suspicious for a yeast infection.  Outpatient Medications Prior to Visit  Medication Sig Dispense Refill  . ALPRAZolam (XANAX) 0.5 MG tablet TAKE 1 TABLET BY MOUTH 4 TIMES DAILY AS NEEDED FOR ANXIETY 90 tablet 3  . aspirin EC 81 MG tablet Take 1 tablet (81 mg total) by mouth daily. 90 tablet 3  . Cholecalciferol 50000 UNITS TABS Take 1 tablet by mouth once a week. (Patient taking differently: Take 1 tablet by mouth every Monday. ) 12 tablet 3  . cyclobenzaprine (FLEXERIL) 10 MG tablet Take 1 tablet (10 mg total) by mouth 3 (three) times daily as needed for muscle spasms. 30 tablet 0  . ezetimibe (ZETIA) 10 MG tablet Take 1 tablet (10 mg total) by mouth daily. 90 tablet 3  . Fluticasone-Umeclidin-Vilant (TRELEGY ELLIPTA) 100-62.5-25 MCG/INH AEPB Inhale 1 puff into the lungs daily. 30 each 11  . metFORMIN (GLUCOPHAGE) 1000 MG tablet TAKE 1 TABLET BY MOUTH TWICE A DAY WITH MEALS. 180 tablet 0  . omeprazole (PRILOSEC) 40 MG capsule Take 2 capsules (80 mg total) by mouth daily. 60 capsule 11  . sertraline (ZOLOFT) 100 MG tablet TAKE 2 TABLETS BY MOUTH DAILY. 180 tablet 0  . buPROPion (WELLBUTRIN XL) 150 MG 24 hr tablet TAKE 1 TABLET BY MOUTH DAILY. 30 tablet 2  . dapagliflozin propanediol (FARXIGA) 5 MG TABS tablet Take 5 mg by mouth daily. 90 tablet 0  . HYDROcodone-acetaminophen (NORCO/VICODIN) 5-325 MG  tablet Take 1 tablet by mouth every 6 (six) hours as needed. Not to exceed 4 tablets a day 75 tablet 0  . levothyroxine (SYNTHROID, LEVOTHROID) 150 MCG tablet TAKE 1 TABLET BY MOUTH ONCE DAILY 90 tablet 0   No facility-administered medications prior to visit.     ROS Review of Systems  Constitutional: Negative for appetite change, diaphoresis, fatigue and unexpected weight change.  HENT: Negative.  Negative for trouble swallowing.   Eyes: Negative for visual disturbance.  Respiratory: Negative for cough, chest tightness, shortness of breath and wheezing.   Cardiovascular: Negative for chest pain, palpitations and leg swelling.  Gastrointestinal: Positive for constipation and diarrhea. Negative for abdominal pain, nausea and vomiting.  Endocrine: Negative for cold intolerance and heat intolerance.  Genitourinary: Negative.  Negative for decreased urine volume, difficulty urinating, dysuria and urgency.  Musculoskeletal: Positive for arthralgias. Negative for back pain and myalgias.  Skin: Negative.  Negative for color change and pallor.  Allergic/Immunologic: Negative.   Neurological: Negative.  Negative for dizziness, weakness, light-headedness and headaches.  Hematological: Negative for adenopathy. Does not bruise/bleed easily.  Psychiatric/Behavioral: Positive for confusion, decreased concentration, dysphoric mood and sleep disturbance. Negative for agitation, behavioral problems, hallucinations, self-injury and suicidal ideas. The patient is nervous/anxious. The patient is not hyperactive.     Objective:  BP 130/70 (BP Location: Left Arm, Patient Position: Sitting, Cuff Size: Large)   Pulse 67   Temp 98.5 F (36.9  C) (Oral)   Ht 5\' 7"  (1.702 m)   Wt 171 lb (77.6 kg)   SpO2 99%   BMI 26.78 kg/m   BP Readings from Last 3 Encounters:  03/19/17 130/70  02/16/17 138/88  11/23/16 130/70    Wt Readings from Last 3 Encounters:  03/19/17 171 lb (77.6 kg)  02/16/17 171 lb 12.8 oz  (77.9 kg)  11/23/16 168 lb (76.2 kg)    Physical Exam  Constitutional: She is oriented to person, place, and time. No distress.  HENT:  Mouth/Throat: Oropharynx is clear and moist. No oropharyngeal exudate.  Eyes: Conjunctivae are normal. Left eye exhibits no discharge. No scleral icterus.  Neck: Normal range of motion. Neck supple. No JVD present. No thyromegaly present.  Cardiovascular: Normal rate, regular rhythm and normal heart sounds. Exam reveals no gallop.  No murmur heard. Pulmonary/Chest: Effort normal and breath sounds normal. No respiratory distress. She has no wheezes. She has no rales.  Abdominal: Soft. Bowel sounds are normal. She exhibits no distension and no mass. There is no tenderness.  Musculoskeletal: Normal range of motion. She exhibits no edema or tenderness.  Lymphadenopathy:    She has no cervical adenopathy.  Neurological: She is alert and oriented to person, place, and time.  Skin: Skin is warm and dry. No rash noted. She is not diaphoretic. No erythema. No pallor.  Psychiatric: She has a normal mood and affect. Her behavior is normal. Judgment and thought content normal.  Vitals reviewed.   Lab Results  Component Value Date   WBC 7.5 04/10/2016   HGB 14.7 04/10/2016   HCT 43.7 04/10/2016   PLT 175.0 04/10/2016   GLUCOSE 174 (H) 03/19/2017   CHOL 231 (H) 04/10/2016   TRIG 182.0 (H) 04/10/2016   HDL 45.00 04/10/2016   LDLDIRECT 158.0 12/01/2013   LDLCALC 149 (H) 04/10/2016   ALT 53 (H) 10/24/2016   AST 36 10/24/2016   NA 140 03/19/2017   K 3.8 03/19/2017   CL 104 03/19/2017   CREATININE 0.69 03/19/2017   BUN 11 03/19/2017   CO2 26 03/19/2017   TSH 0.25 (L) 03/19/2017   HGBA1C 7.2 03/19/2017   MICROALBUR <0.7 10/24/2016    Dg Chest 2 View  Result Date: 10/24/2016 CLINICAL DATA:  Cough, wheezing, some chest pain, smoking his EXAM: CHEST  2 VIEW COMPARISON:  Chest x-ray of 10/13/2015 FINDINGS: No active infiltrate or effusion is seen. There is  mild peribronchial thickening which may indicate bronchitis. Mediastinal and hilar contours are unremarkable. The heart is within normal limits in size. There are mild degenerative changes in the lower thoracic spine. IMPRESSION: No pneumonia or effusion.  Question mild bronchitis. Electronically Signed   By: Ivar Drape M.D.   On: 10/24/2016 09:38    Assessment & Plan:   [name] was seen today for hypothyroidism and diabetes.  Diagnoses and all orders for this visit:  Acquired hypothyroidism- Her TSH is suppressed so I have asked her to decrease her dose of levothyroxine. -     TSH; Future -     levothyroxine (SYNTHROID, LEVOTHROID) 125 MCG tablet; Take 1 tablet (125 mcg total) by mouth daily.  Depression with anxiety- Will restart Wellbutrin at her request.  We will add trazodone to the SSRI for improvement in her symptoms. -     buPROPion (WELLBUTRIN XL) 150 MG 24 hr tablet; Take 1 tablet (150 mg total) by mouth daily. -     traZODone (DESYREL) 100 MG tablet; Take 1 tablet (100 mg  total) by mouth at bedtime.  Encounter for long-term opiate analgesic use- I will monitor her urine drug screen to confirm compliance with prescribed meds and to screen for substance abuse. -     Pain Mgmt, Profile 8 w/Conf, U; Future  Type 2 diabetes mellitus with complication, with long-term current use of insulin ([place])- Her blood sugars are not quite adequately well controlled.  She is not tolerating the SGLT-2 inhibitor.  Will continue metformin.  Will add a GLP-1 agonist. -     Basic metabolic panel; Future -     POCT glycosylated hemoglobin (Hb A1C) -     POCT Glucose (Device for Home Use) -     Semaglutide (OZEMPIC) 0.25 or 0.5 MG/DOSE SOPN; Inject 0.5 mg into the skin once a week.  Need for pneumococcal vaccination -     Pneumococcal polysaccharide vaccine 23-valent greater than or equal to 2yo subcutaneous/IM  Primary insomnia -     traZODone (DESYREL) 100 MG tablet; Take 1 tablet (100 mg total) by  mouth at bedtime.  Primary osteoarthritis of both knees -     HYDROcodone-acetaminophen (NORCO/VICODIN) 5-325 MG tablet; Take 1 tablet by mouth every 6 (six) hours as needed. Not to exceed 4 tablets a day  Memory difficulties -     Ambulatory referral to Neurology   I have discontinued [name] D. Addo's dapagliflozin propanediol and levothyroxine. I have also changed her buPROPion. Additionally, I am having her start on Semaglutide, traZODone, and levothyroxine. Lastly, I am having her maintain her Cholecalciferol, aspirin EC, ezetimibe, omeprazole, Fluticasone-Umeclidin-Vilant, ALPRAZolam, cyclobenzaprine, metFORMIN, sertraline, and HYDROcodone-acetaminophen.  Meds ordered this encounter  Medications  . Semaglutide (OZEMPIC) 0.25 or 0.5 MG/DOSE SOPN    Sig: Inject 0.5 mg into the skin once a week.    Dispense:  9 mL    Refill:  1  . HYDROcodone-acetaminophen (NORCO/VICODIN) 5-325 MG tablet    Sig: Take 1 tablet by mouth every 6 (six) hours as needed. Not to exceed 4 tablets a day    Dispense:  75 tablet    Refill:  0  . buPROPion (WELLBUTRIN XL) 150 MG 24 hr tablet    Sig: Take 1 tablet (150 mg total) by mouth daily.    Dispense:  90 tablet    Refill:  1  . traZODone (DESYREL) 100 MG tablet    Sig: Take 1 tablet (100 mg total) by mouth at bedtime.    Dispense:  90 tablet    Refill:  1  . levothyroxine (SYNTHROID, LEVOTHROID) 125 MCG tablet    Sig: Take 1 tablet (125 mcg total) by mouth daily.    Dispense:  90 tablet    Refill:  0     Follow-up: Return in about 4 months (around 07/17/2017).  [name], MD

## 2017-03-20 ENCOUNTER — Other Ambulatory Visit: Payer: Self-pay | Admitting: Internal Medicine

## 2017-03-20 ENCOUNTER — Telehealth: Payer: Self-pay

## 2017-03-20 MED ORDER — PROMETHAZINE HCL 12.5 MG PO TABS: 12.5000 mg | ORAL_TABLET | Freq: Four times a day (QID) | ORAL | 0 refills | Status: DC | PRN

## 2017-03-20 NOTE — Telephone Encounter (Signed)
RX sent

## 2017-03-20 NOTE — Telephone Encounter (Signed)
Copied from [place]. Topic: Inquiry >> Mar 20, 2017 11:13 AM Tasha [name] wrote: Reason for CRM: pt states she was injected w/ a shot yesterday and feels she is having side effects, light headed and vomiting, contact pt to advise

## 2017-03-20 NOTE — Telephone Encounter (Signed)
Patient advised, patient also states she forgot to mention that she has also had tightness in her chest---but no problems with breathing---per dr Ronnald Ramp, patient has been scheduled at 1:45 2/13 to do EKG just to make sure something else is not occurring unrelated to ozempic side effects

## 2017-03-20 NOTE — Telephone Encounter (Signed)
Recd note from [facility] (that was incorrectly attached to labs, so it's difficult to locate in chart review) stating patient recd shot yesterday and she has been nauseated,vomiting, lightheadedness and headache ever since---I have talked with patient, she was given Ozempic in the office visit, 45 min after arriving home is when the symptoms began---per dr Ronnald Ramp, patient has been advised to stop taking Ozempic--symptoms will slowly wear off, patient is requesting something be called into  outpt pharm for nausea/vomiting---routing to dr Ronnald Ramp, can you please call something in, thanks

## 2017-03-21 ENCOUNTER — Ambulatory Visit: Payer: Medicare Other | Admitting: Internal Medicine

## 2017-03-21 ENCOUNTER — Encounter: Payer: Self-pay | Admitting: Psychology

## 2017-03-21 DIAGNOSIS — Z0289 Encounter for other administrative examinations: Secondary | ICD-10-CM

## 2017-03-24 LAB — PAIN MGMT, PROFILE 8 W/CONF, U
6 Acetylmorphine: NEGATIVE ng/mL (ref <10)
Alcohol Metabolites: NEGATIVE ng/mL (ref <500)
Alphahydroxyalprazolam: 130 ng/mL — ABNORMAL HIGH (ref <25)
Alphahydroxymidazolam: NEGATIVE ng/mL (ref <50)
Alphahydroxytriazolam: NEGATIVE ng/mL (ref <50)
Aminoclonazepam: NEGATIVE ng/mL (ref <25)
Amphetamines: NEGATIVE ng/mL (ref <500)
Benzodiazepines: POSITIVE ng/mL — AB (ref <100)
Buprenorphine, Urine: NEGATIVE ng/mL (ref <5)
Cocaine Metabolite: NEGATIVE ng/mL (ref <150)
Creatinine: 113.6 mg/dL
Hydroxyethylflurazepam: NEGATIVE ng/mL (ref <50)
Lorazepam: NEGATIVE ng/mL (ref <50)
MDMA: NEGATIVE ng/mL (ref <500)
Marijuana Metabolite: NEGATIVE ng/mL (ref <20)
Nordiazepam: NEGATIVE ng/mL (ref <50)
Opiates: NEGATIVE ng/mL (ref <100)
Oxazepam: NEGATIVE ng/mL (ref <50)
Oxidant: NEGATIVE ug/mL (ref <200)
Oxycodone: NEGATIVE ng/mL (ref <100)
Temazepam: NEGATIVE ng/mL (ref <50)
pH: 7.15 (ref 4.5–9.0)

## 2017-03-25 ENCOUNTER — Other Ambulatory Visit: Payer: Self-pay | Admitting: Internal Medicine

## 2017-04-02 ENCOUNTER — Telehealth: Payer: Self-pay

## 2017-04-02 NOTE — Telephone Encounter (Signed)
-----   Message from [facility], [place] sent at 03/19/2017  1:03 PM EST ----- Regarding: Urine Drug Screen Message from the lab: s  Hello, a patient by the name Tasha, [name] DOB: [birth year] came in for labs today, she was not able to collect her urine drug. She sat in the lobby here for about 15 mins drinking watrer...then no one knew where she went. I'm assuming she left. Its been almost 2 hours since she's been here and she did not leave the urine here.

## 2017-04-16 ENCOUNTER — Other Ambulatory Visit: Payer: Self-pay | Admitting: Internal Medicine

## 2017-04-16 DIAGNOSIS — F418 Other specified anxiety disorders: Secondary | ICD-10-CM

## 2017-04-16 MED FILL — AMOXICILLIN 500 MG CAPSULE: 500 | 7 days supply | Qty: 21 | Fill #0

## 2017-04-16 MED FILL — OMEPRAZOLE DR 40 MG CAPSULE: 40 | 30 days supply | Qty: 60 | Fill #4

## 2017-04-16 MED FILL — ALPRAZolam 0.5 MG TABS: 0.5 | 23 days supply | Qty: 90 | Fill #0

## 2017-04-16 MED FILL — HYDROCODON-APAP 5-325: 5-325 | 3 days supply | Qty: 20 | Fill #0

## 2017-04-23 ENCOUNTER — Ambulatory Visit (INDEPENDENT_AMBULATORY_CARE_PROVIDER_SITE_OTHER): Payer: Private Health Insurance - Indemnity | Admitting: *Deleted

## 2017-04-23 DIAGNOSIS — Z23 Encounter for immunization: Secondary | ICD-10-CM

## 2017-05-21 ENCOUNTER — Other Ambulatory Visit: Payer: Self-pay | Admitting: Family Medicine

## 2017-05-21 ENCOUNTER — Other Ambulatory Visit: Payer: Self-pay | Admitting: Internal Medicine

## 2017-05-21 MED FILL — OMEPRAZOLE DR 40 MG CAPSULE: 40 | 30 days supply | Qty: 60 | Fill #5

## 2017-05-21 MED FILL — ALPRAZolam 0.5 MG TABS: 0.5 | 23 days supply | Qty: 90 | Fill #1

## 2017-05-21 MED FILL — LEVOTHYROXINE 125 MCG TABLE: 125 | 90 days supply | Qty: 90 | Fill #0

## 2017-05-21 MED FILL — VITAMIN D3 50000 UNIT CAPS: 1.25 MG | 84 days supply | Qty: 12 | Fill #0

## 2017-05-25 IMAGING — DX DG CHEST 2V
2 series · 2 of 2 positions shown · non-contrast
Comparison: 12/09/2014.

CLINICAL DATA: Left chest pain and pressure, left arm weakness and
shortness of breath since 5 p.m. today. Smoker.

EXAM:
CHEST  2 VIEW

[w chest pa]
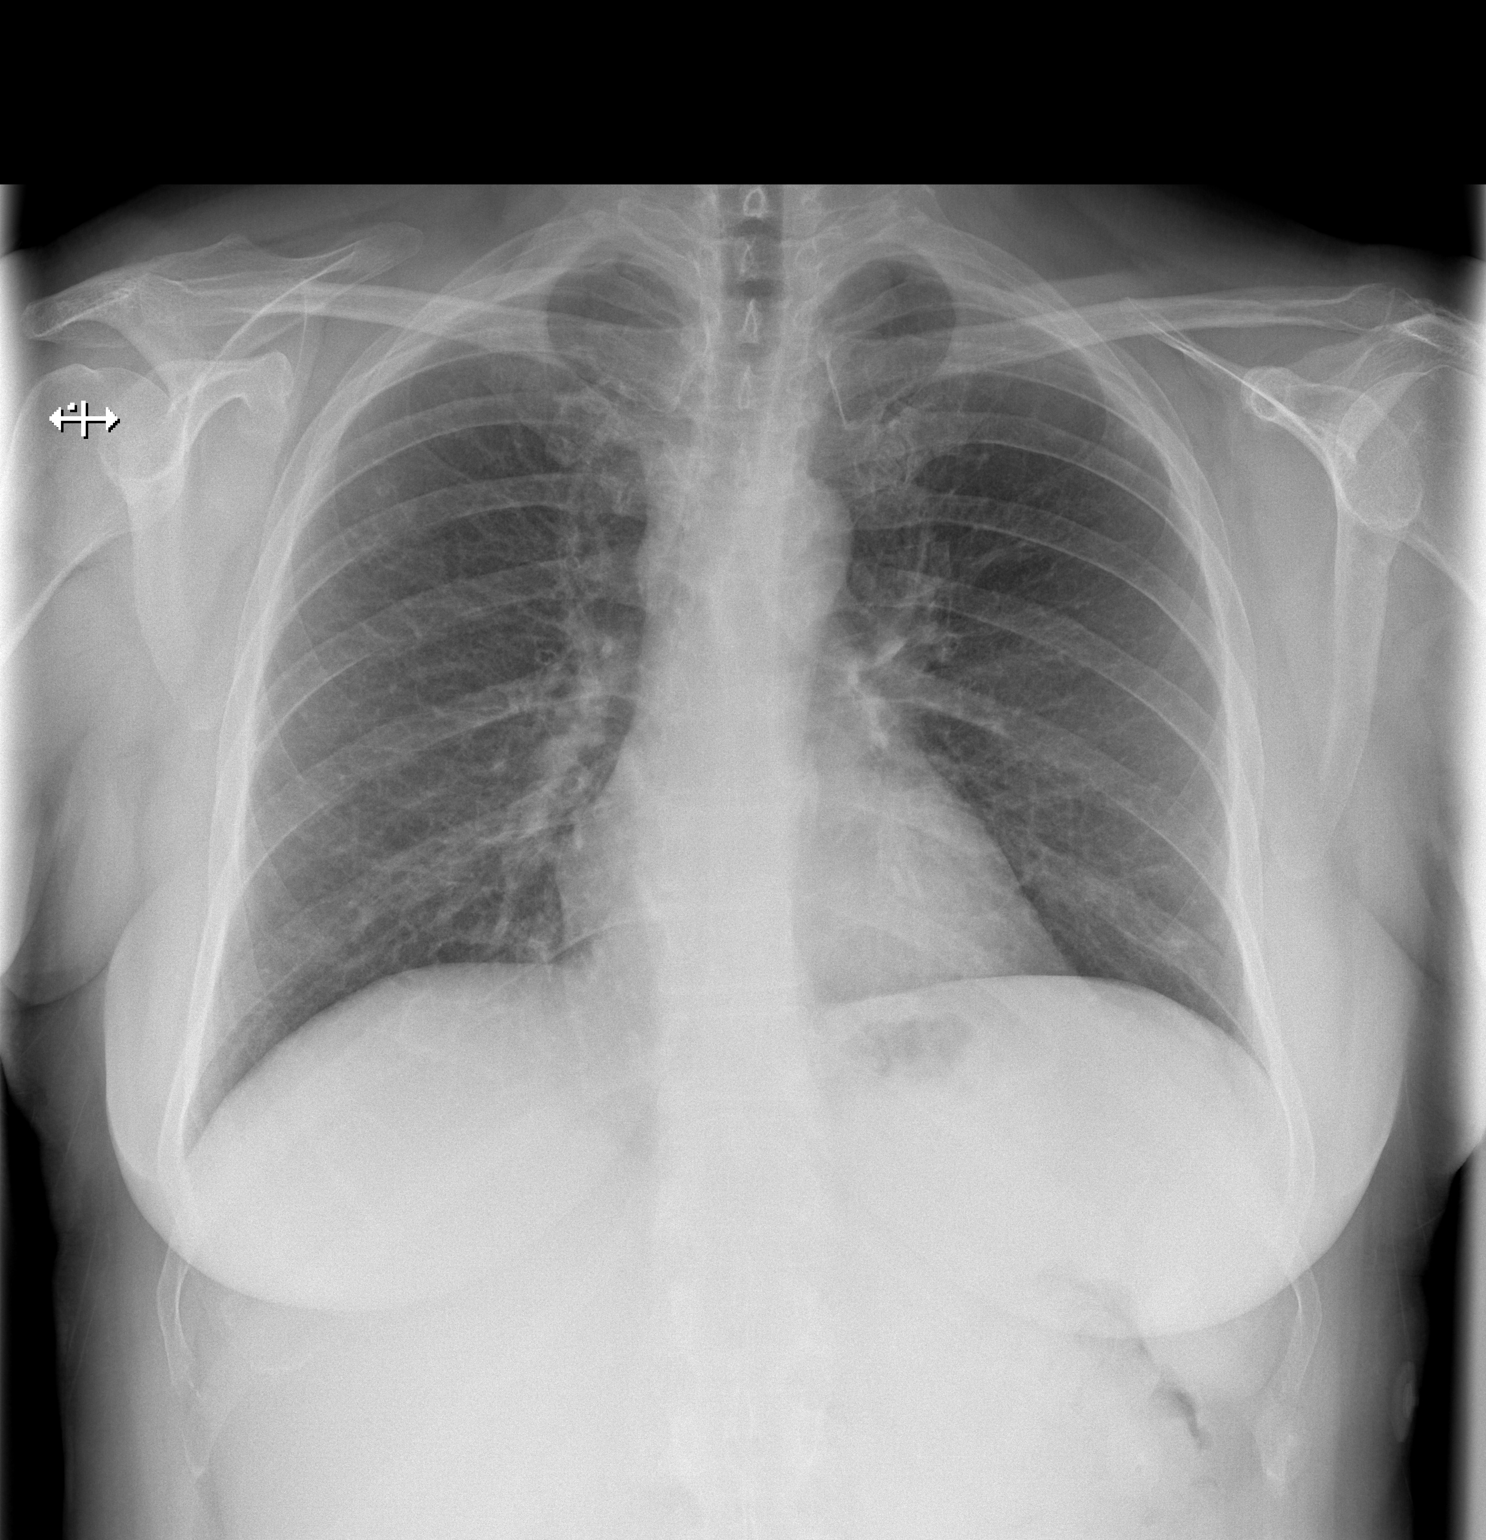

[w chest lat]
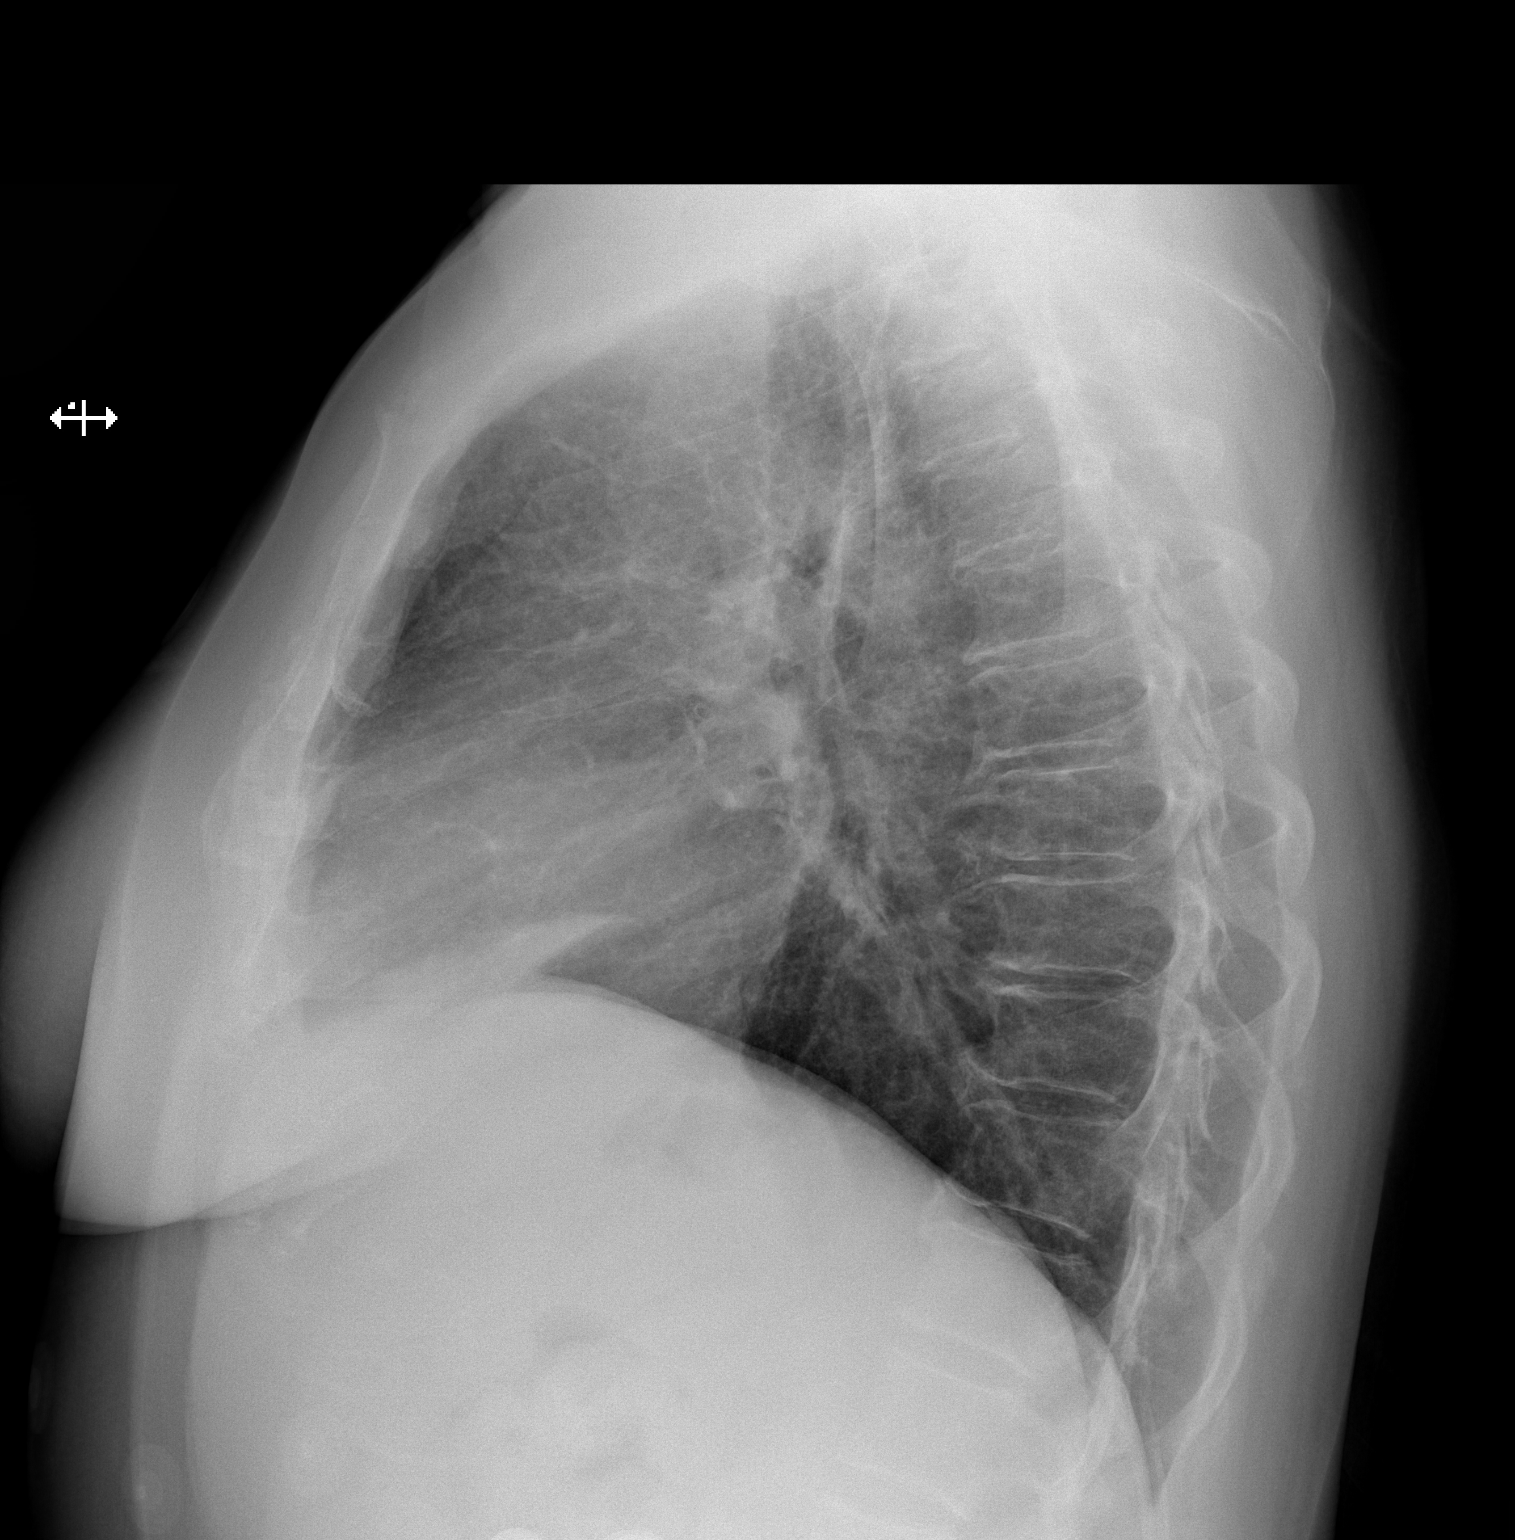

[2 of 2 positions shown; findings below may reference images not displayed]

FINDINGS: Normal sized heart. Clear lungs. Stable diffuse peribronchial
thickening and accentuation of the interstitial markings. Thoracic
spine degenerative changes.
IMPRESSION: No acute abnormality.  Stable chronic bronchitic changes.

## 2017-05-28 ENCOUNTER — Telehealth: Payer: Self-pay | Admitting: Internal Medicine

## 2017-05-28 ENCOUNTER — Ambulatory Visit: Payer: Medicare Other | Admitting: Family

## 2017-05-28 DIAGNOSIS — Z0289 Encounter for other administrative examinations: Secondary | ICD-10-CM

## 2017-05-28 NOTE — Telephone Encounter (Signed)
Patient states she is feeling better from yesterday.  I have scheduled patient for today with Mickel Baas at 2:20pm

## 2017-05-28 NOTE — Telephone Encounter (Signed)
Received a [facility] report regarding this patient.  08/26/2017 @ 950 am: She stating that she was having kidney pain last night and almost passed out in the shower. She fells dizzy and light headed. She reports that she continues to have back pain, sore to touch. Denies current fever. Back pain is on the right side but also on the left and if having knee swelling.  She was advised to go to the ED but she did not go. I called the patient this morning to follow up and to try to get her scheduled. No answer and no voicemail.  Please advise.

## 2017-05-29 ENCOUNTER — Telehealth: Payer: Self-pay

## 2017-05-29 NOTE — Telephone Encounter (Signed)
Received a fax from [facility] wanting to know more information about pts adverse reaction to their drug. Contacted pt and asked questions regarding how long sx of vomiting, weakness, chest pain, difficulty peeing and overall feeling awful has lasted.

## 2017-05-30 ENCOUNTER — Encounter: Payer: Self-pay | Admitting: Internal Medicine

## 2017-05-31 NOTE — Telephone Encounter (Signed)
This has been mailed to [employer]. Copy placed in chart.

## 2017-06-20 ENCOUNTER — Ambulatory Visit: Payer: Self-pay

## 2017-06-20 NOTE — Telephone Encounter (Signed)
Pt. Reports she took Ozempic in Jan. Or Feb of this year and started having diarrhea, some intermittent vomiting, dizziness at intervals. States she feels like she needs a colonoscopy. Reports stools are the consistency "of mud" . Appointment made for June 6. Wants to know if Dr. Ronnald Ramp will schedule her for a colonoscopy. Reason for Disposition . [1] MODERATE diarrhea (e.g., 4-6 times / day more than normal) AND [2] present > 48 hours (2 days)  Answer Assessment - Initial Assessment Questions 1. DIARRHEA SEVERITY: "How bad is the diarrhea?" "How many extra stools have you had in the past 24 hours than normal?"    - MILD: Few loose or mushy BMs; increase of 1-3 stools over normal daily number of stools; mild increase in ostomy output.   - MODERATE: Increase of 4-6 stools daily over normal; moderate increase in ostomy output.   - SEVERE (or Worst Possible): Increase of 7 or more stools daily over normal; moderate increase in ostomy output; incontinence.     3-4 2. ONSET: "When did the diarrhea begin?"      February 3. BM CONSISTENCY: "How loose or watery is the diarrhea?"      Mud 4. VOMITING: "Are you also vomiting?" If so, ask: "How many times in the past 24 hours?"      Not today - yesterday 5. ABDOMINAL PAIN: "Are you having any abdominal pain?" If yes: "What does it feel like?" (e.g., crampy, dull, intermittent, constant)      Some cramping 6. ABDOMINAL PAIN SEVERITY: If present, ask: "How bad is the pain?"  (e.g., Scale 1-10; mild, moderate, or severe)    - MILD (1-3): doesn't interfere with normal activities, abdomen soft and not tender to touch     - MODERATE (4-7): interferes with normal activities or awakens from sleep, tender to touch     - SEVERE (8-10): excruciating pain, doubled over, unable to do any normal activities       4 7. ORAL INTAKE: If vomiting, "Have you been able to drink liquids?" "How much fluids have you had in the past 24 hours?"     Intake is good 8. HYDRATION:  "Any signs of dehydration?" (e.g., dry mouth [not just dry lips], too weak to stand, dizziness, new weight loss) "When did you last urinate?"     Some dizzy 9. EXPOSURE: "Have you traveled to a foreign country recently?" "Have you been exposed to anyone with diarrhea?" "Could you have eaten any food that was spoiled?"     No 10. OTHER SYMPTOMS: "Do you have any other symptoms?" (e.g., fever, blood in stool)       No 11. PREGNANCY: "Is there any chance you are pregnant?" "When was your last menstrual period?"       No  Protocols used: DIARRHEA-A-AH

## 2017-06-21 MED FILL — ALPRAZolam 0.5 MG TABS: 0.5 | 23 days supply | Qty: 90 | Fill #2

## 2017-06-21 MED FILL — buPROPion HCL ER (XL) 150 M: 150 | 90 days supply | Qty: 90 | Fill #1

## 2017-06-22 ENCOUNTER — Other Ambulatory Visit: Payer: Self-pay | Admitting: Internal Medicine

## 2017-06-22 DIAGNOSIS — E785 Hyperlipidemia, unspecified: Secondary | ICD-10-CM

## 2017-06-26 NOTE — Telephone Encounter (Signed)
Tried to contact pt. Number listed for pt is no longer in service. Per previous emails between pt and PCP, pt needs an appt for these symptoms.

## 2017-06-27 ENCOUNTER — Encounter (INDEPENDENT_AMBULATORY_CARE_PROVIDER_SITE_OTHER): Payer: Self-pay

## 2017-06-29 LAB — HM DIABETES EYE EXAM

## 2017-07-06 ENCOUNTER — Encounter: Payer: Self-pay | Admitting: Internal Medicine

## 2017-07-12 ENCOUNTER — Ambulatory Visit: Payer: Medicare Other | Admitting: Internal Medicine

## 2017-07-12 ENCOUNTER — Other Ambulatory Visit: Payer: Self-pay | Admitting: Internal Medicine

## 2017-07-12 DIAGNOSIS — M170 Bilateral primary osteoarthritis of knee: Secondary | ICD-10-CM

## 2017-07-12 DIAGNOSIS — E118 Type 2 diabetes mellitus with unspecified complications: Secondary | ICD-10-CM

## 2017-07-12 MED FILL — ALPRAZolam 0.5 MG TABS: 0.5 | 23 days supply | Qty: 90 | Fill #3

## 2017-07-12 MED FILL — OMEPRAZOLE 40 MG CPDR: 40 | 30 days supply | Qty: 60 | Fill #6

## 2017-07-13 MED FILL — metFORMIN HCL 1000 MG TABS: 1000 | 90 days supply | Qty: 180 | Fill #0

## 2017-07-16 ENCOUNTER — Other Ambulatory Visit: Payer: Self-pay | Admitting: Internal Medicine

## 2017-07-16 DIAGNOSIS — Z79891 Long term (current) use of opiate analgesic: Secondary | ICD-10-CM

## 2017-07-16 MED ORDER — NALOXONE HCL 4 MG/0.1ML NA LIQD: 1.0000 | Freq: Once | NASAL | 2 refills | Status: AC

## 2017-07-16 MED FILL — NARCAN 4 MG NASAL SPRAY: 4 | 2 days supply | Qty: 2 | Fill #0

## 2017-07-16 MED FILL — HYDROCODON-APAP 5-325: 5-325 | 19 days supply | Qty: 75 | Fill #0

## 2017-07-18 ENCOUNTER — Ambulatory Visit: Payer: Self-pay | Admitting: Internal Medicine

## 2017-07-18 NOTE — Telephone Encounter (Signed)
Patient has a history of poison oak/poison ivy  In past. Patient is diabetic. Pt reports a rash that itches on arm and neck. Pt denies  any facial swelling at this time. She reports was working outside in garden recently  And her grandchild has rash as well.Appointment made with Clearance Coots for tomorrow at [facility]  Reason for Disposition . Severe poison ivy, oak, or sumac reaction in the past  Answer Assessment - Initial Assessment Questions 1. APPEARANCE of RASH: "Describe the rash."       Small red blisters  2. LOCATION: "Where is the rash located?"        Neck  And r  Arm   3. SIZE: "How large is the rash?"         4 blister on arm   3  Blisters on neck  4. ONSET: "When did the rash begin?"           This  Am   5. ITCHING: "Does the rash itch?" If so, ask: "How bad is it?"   - MILD - doesn't interfere with normal activities   - MODERATE-SEVERE: interferes with work, school, sleep, or other activities        Yes  Itches bad  - taking benadryl   6. PREGNANCY: "Is there any chance you are pregnant?" "When was your last menstrual period?"           N/A  Protocols used: POISON IVY - OAK - SUMAC-A-AH

## 2017-07-19 ENCOUNTER — Ambulatory Visit: Payer: Medicare Other | Admitting: Family Medicine

## 2017-07-19 DIAGNOSIS — Z0289 Encounter for other administrative examinations: Secondary | ICD-10-CM

## 2017-07-19 NOTE — Progress Notes (Deleted)
Tasha [name] - 57 y.o. female MRN [id number]  Date of birth: [birth year]  SUBJECTIVE:  Including CC & ROS.  No chief complaint on file.   Tasha [name] is a 57 y.o. female that is  ***.  ***   Review of Systems  HISTORY: Past Medical, Surgical, Social, and Family History Reviewed & Updated per EMR.   Pertinent Historical Findings include:  Past Medical History:  Diagnosis Date  . Anxiety state, unspecified   . Arthralgia of temporomandibular joint   . Conversion disorder   . Depressive disorder, not elsewhere classified   . Dysphagia, unspecified(787.20)   . Endometriosis   . Esophageal reflux   . Headache(784.0)   . Irritable bowel syndrome   . Mitral valve disorders(424.0)   . Myalgia and myositis, unspecified   . Overweight(278.02)   . Periodic limb movement disorder   . Pure hypercholesterolemia   . Unspecified hypothyroidism     Past Surgical History:  Procedure Laterality Date  . ABDOMINAL HYSTERECTOMY     TAH  . arm surgery    . FOOT SURGERY    . LIPOMA EXCISION     BESIDES BELLY BUTTON  . TONSILLECTOMY    . WRIST SURGERY      Allergies  Allergen Reactions  . Wilder Glade [Dapagliflozin] Other (See Comments)    Vag yeast inf  . Statins Other (See Comments)    Muscle weakness/pains  . Crestor [Rosuvastatin] Other (See Comments)    Muscle aches  . Lipitor [Atorvastatin] Other (See Comments)    Muscle aches  . Zocor [Simvastatin] Other (See Comments)    Leg pain per patient/PE    Family History  Problem Relation Age of Onset  . Heart disease Mother   . Heart disease Father   . Diabetes Father   . Breast cancer Maternal Aunt   . Colon cancer Paternal Aunt      Social History   Socioeconomic History  . Marital status: Married    Spouse name: Water quality scientist [place]  . Number of children: 2  . Years of education: Not on file  . Highest education level: Not on file  Occupational History  . Occupation: Disabled    Employer: [employer]  . Financial resource strain: Not on file  . Food insecurity:    Worry: Not on file    Inability: Not on file  . Transportation needs:    Medical: Not on file    Non-medical: Not on file  Tobacco Use  . Smoking status: Current Every Day Smoker    Packs/day: 1.00    Types: Cigarettes  . Smokeless tobacco: Never Used  Substance and Sexual Activity  . Alcohol use: No    Alcohol/week: 0.0 oz  . Drug use: No  . Sexual activity: Yes  Lifestyle  . Physical activity:    Days per week: Not on file    Minutes per session: Not on file  . Stress: Not on file  Relationships  . Social connections:    Talks on phone: Not on file    Gets together: Not on file    Attends religious service: Not on file    Active member of club or organization: Not on file    Attends meetings of clubs or organizations: Not on file    Relationship status: Not on file  . Intimate partner violence:    Fear of current or ex partner: Not on file    Emotionally abused: Not on file  Physically abused: Not on file    Forced sexual activity: Not on file  Other Topics Concern  . Not on file  Social History Narrative  . Not on file     PHYSICAL EXAM:  VS: There were no vitals taken for this visit. Physical Exam Gen: NAD, alert, cooperative with exam, well-appearing ENT: normal lips, normal nasal mucosa,  Eye: normal EOM, normal conjunctiva and lids CV:  no edema, +2 pedal pulses   Resp: no accessory muscle use, non-labored,  GI: no masses or tenderness, no hernia  Skin: no rashes, no areas of induration  Neuro: normal tone, normal sensation to touch Psych:  normal insight, alert and oriented MSK:  ***      ASSESSMENT & PLAN:   No problem-specific Assessment & Plan notes found for this encounter.

## 2017-07-24 ENCOUNTER — Telehealth: Payer: Self-pay | Admitting: Emergency Medicine

## 2017-07-24 NOTE — Telephone Encounter (Signed)
Called patient to schedule AWV. Pt declined at this time. 

## 2017-08-03 ENCOUNTER — Ambulatory Visit: Payer: Medicare Other | Admitting: Internal Medicine

## 2017-08-07 ENCOUNTER — Encounter: Payer: Medicare Other | Admitting: Psychology

## 2017-08-07 ENCOUNTER — Ambulatory Visit: Payer: Self-pay | Admitting: *Deleted

## 2017-08-10 ENCOUNTER — Other Ambulatory Visit: Payer: Self-pay | Admitting: Internal Medicine

## 2017-08-10 DIAGNOSIS — E039 Hypothyroidism, unspecified: Secondary | ICD-10-CM

## 2017-08-10 DIAGNOSIS — F418 Other specified anxiety disorders: Secondary | ICD-10-CM

## 2017-08-10 MED FILL — OMEPRAZOLE 40 MG CPDR: 40 | 30 days supply | Qty: 60 | Fill #7

## 2017-08-13 ENCOUNTER — Other Ambulatory Visit: Payer: Self-pay | Admitting: Internal Medicine

## 2017-08-16 ENCOUNTER — Telehealth: Payer: Self-pay | Admitting: Internal Medicine

## 2017-08-16 ENCOUNTER — Other Ambulatory Visit: Payer: Self-pay | Admitting: Internal Medicine

## 2017-08-16 DIAGNOSIS — F418 Other specified anxiety disorders: Secondary | ICD-10-CM

## 2017-08-16 DIAGNOSIS — E039 Hypothyroidism, unspecified: Secondary | ICD-10-CM

## 2017-08-16 MED ORDER — ALPRAZOLAM 0.5 MG PO TABS: ORAL_TABLET | ORAL | 0 refills | Status: DC

## 2017-08-16 MED ORDER — LEVOTHYROXINE SODIUM 125 MCG PO TABS: 125.0000 ug | ORAL_TABLET | Freq: Every day | ORAL | 0 refills | Status: DC

## 2017-08-16 NOTE — Telephone Encounter (Signed)
Pt is rq rx for alprazolam. Appt sched for 08/30/2017.

## 2017-08-16 NOTE — Telephone Encounter (Signed)
Patient has appt 7/25

## 2017-08-16 NOTE — Telephone Encounter (Signed)
Pt is due for a follow up appt.   Pt has also requested a referral to GI for colonoscopy due to new family history. I think this will need a visit also in order for insurance to pay for it.   Please schedule. Refill can be address once an appointment is made. I did send in the levothyroxine.

## 2017-08-16 NOTE — Telephone Encounter (Signed)
Copied from [place] [id number]. Topic: Inquiry >> Aug 16, 2017 10:05 AM Pricilla Handler wrote: Reason for CRM: Patient called requesting refills of ALPRAZolam (XANAX) 0.5 MG tablet, Cholecalciferol (VITAMIN D3) 50000 units CAPS, and Levothyroxine (SYNTHROID, LEVOTHROID) 125 MCG tablet. Patient's preferred pharmacy is [place], [place] - [address] [phone] (Phone)  [phone] (Fax).       Thank You!!!

## 2017-08-30 ENCOUNTER — Ambulatory Visit (INDEPENDENT_AMBULATORY_CARE_PROVIDER_SITE_OTHER): Payer: Medicare Other | Admitting: Internal Medicine

## 2017-08-30 ENCOUNTER — Encounter: Payer: Self-pay | Admitting: Internal Medicine

## 2017-08-30 ENCOUNTER — Other Ambulatory Visit (INDEPENDENT_AMBULATORY_CARE_PROVIDER_SITE_OTHER): Payer: Medicare Other

## 2017-08-30 VITALS — BP 134/76 | HR 67 | Temp 98.2°F | Resp 16 | Ht 67.0 in | Wt 168.0 lb

## 2017-08-30 DIAGNOSIS — R197 Diarrhea, unspecified: Secondary | ICD-10-CM

## 2017-08-30 DIAGNOSIS — Z1231 Encounter for screening mammogram for malignant neoplasm of breast: Secondary | ICD-10-CM

## 2017-08-30 DIAGNOSIS — E785 Hyperlipidemia, unspecified: Secondary | ICD-10-CM | POA: Diagnosis not present

## 2017-08-30 DIAGNOSIS — Z79899 Other long term (current) drug therapy: Secondary | ICD-10-CM

## 2017-08-30 DIAGNOSIS — E039 Hypothyroidism, unspecified: Secondary | ICD-10-CM

## 2017-08-30 DIAGNOSIS — Z0000 Encounter for general adult medical examination without abnormal findings: Secondary | ICD-10-CM | POA: Diagnosis not present

## 2017-08-30 DIAGNOSIS — E118 Type 2 diabetes mellitus with unspecified complications: Secondary | ICD-10-CM

## 2017-08-30 DIAGNOSIS — R55 Syncope and collapse: Secondary | ICD-10-CM | POA: Diagnosis not present

## 2017-08-30 DIAGNOSIS — Z1239 Encounter for other screening for malignant neoplasm of breast: Secondary | ICD-10-CM

## 2017-08-30 LAB — COMPREHENSIVE METABOLIC PANEL
ALT: 43 U/L — ABNORMAL HIGH (ref 0–35)
AST: 35 U/L (ref 0–37)
Albumin: 4.3 g/dL (ref 3.5–5.2)
Alkaline Phosphatase: 73 U/L (ref 39–117)
BUN: 12 mg/dL (ref 6–23)
CO2: 26 mEq/L (ref 19–32)
Calcium: 9.4 mg/dL (ref 8.4–10.5)
Chloride: 105 mEq/L (ref 96–112)
Creatinine, Ser: 0.8 mg/dL (ref 0.40–1.20)
GFR: 78.19 mL/min (ref >60.00)
Glucose, Bld: 118 mg/dL — ABNORMAL HIGH (ref 70–99)
Potassium: 3.9 mEq/L (ref 3.5–5.1)
Sodium: 139 mEq/L (ref 135–145)
Total Bilirubin: 0.4 mg/dL (ref 0.2–1.2)
Total Protein: 7.4 g/dL (ref 6.0–8.3)

## 2017-08-30 LAB — LDL CHOLESTEROL, DIRECT: Direct LDL: 176 mg/dL

## 2017-08-30 LAB — HEMOGLOBIN A1C: Hgb A1c MFr Bld: 7.1 % — ABNORMAL HIGH (ref 4.6–6.5)

## 2017-08-30 LAB — LIPID PANEL
Cholesterol: 258 mg/dL — ABNORMAL HIGH (ref 0–200)
HDL: 36.9 mg/dL — ABNORMAL LOW (ref >39.00)
NonHDL: 221.4
Total CHOL/HDL Ratio: 7
Triglycerides: 270 mg/dL — ABNORMAL HIGH (ref 0.0–149.0)
VLDL: 54 mg/dL — ABNORMAL HIGH (ref 0.0–40.0)

## 2017-08-30 LAB — TSH: TSH: 2.39 u[IU]/mL (ref 0.35–4.50)

## 2017-08-30 MED ORDER — PANCRELIPASE (LIP-PROT-AMYL) 40000-126000 UNITS PO CPEP: 1.0000 | ORAL_CAPSULE | Freq: Three times a day (TID) | ORAL | 1 refills | Status: DC

## 2017-08-30 MED FILL — LEVOTHYROXINE 125 MCG TABLE: 125 | 90 days supply | Qty: 90 | Fill #0

## 2017-08-30 MED FILL — ALPRAZolam 0.5 MG TABS: 0.5 | 30 days supply | Qty: 90 | Fill #0

## 2017-08-30 NOTE — Patient Instructions (Signed)
Chronic Diarrhea Diarrhea is a condition in which a person passes frequent loose and watery stools. It can cause you to feel weak and dehydrated. Dehydration can make you tired and thirsty. It can also cause a dry mouth, decreased urination, and dark yellow urine. Diarrhea is a sign of another underlying problem, such as:  Infection.  Medication side effects.  Dietary intolerance, such as lactose intolerance.  Conditions such as celiac disease, irritable bowel syndrome (IBS), or inflammatory bowel disease (IBD).  In most cases, diarrhea lasts 2-3 days. Diarrhea that lasts longer than 4 weeks is called long-lasting (chronic) diarrhea. It is important that you treat your diarrhea as told by your health care provider. Follow these instructions at home: Follow these recommendations as told by your health care provider. Eating and drinking  Take an oral rehydration solution (ORS). This is a drink that is designed to keep you hydrated. It can be found at pharmacies and retail stores.  Drink clear fluids, such as water, ice chips, diluted fruit juice, and low-calorie sports drinks.  Follow the diet recommended by your health care provider. You may need to avoid foods that trigger diarrhea for you.  Avoid foods and beverages that contain a lot of sugar or caffeine.  Avoid alcohol.  Avoid spicy or fatty foods. General instructions  Drink enough fluid to keep your urine clear or pale yellow.  Wash your hands often and after each diarrhea episode. If soap and water are not available, use hand sanitizer.  Make sure that all people in your household wash their hands well and often.  Take over-the-counter and prescription medicines only as told by your health care provider.  If you were prescribed an antibiotic medicine, take it as told by your health care provider. Do not stop taking the antibiotic even if you start to feel better.  Rest at home while you recover.  Watch your condition  for any changes.  Take a warm bath to relieve any burning or pain from frequent diarrhea episodes.  Keep all follow-up visits as told by your health care provider. This is important. Contact a health care provider if:  You have a fever.  Your diarrhea gets worse or does not get better.  You have new symptoms.  You cannot drink fluids without vomiting.  You feel light-headed or dizzy.  You have a headache.  You have muscle cramps.  You have severe pain in the rectum. Get help right away if:  You have persistent vomiting.  You have chest pain.  You feel extremely weak or you faint.  You have bloody or black stools, or stools that look like tar.  You have severe pain, cramping, or bloating in your abdomen, or pain that stays in one place.  You have trouble breathing or you are breathing very quickly.  Your heart is beating very quickly.  Your skin feels cold and clammy.  You feel confused.  You have a severe headache.  You have signs of dehydration, such as: ? Dark urine, very little urine, or no urine. ? Cracked lips. ? Dry mouth. ? Sunken eyes. ? Sleepiness. ? Weakness. Summary  Chronic diarrhea is a condition in which a person passes frequent loose and watery stools for more than 4 weeks.  Diarrhea is a sign of another underlying problem.  Drink enough fluid to keep your urine clear or pale yellow to avoid dehydration.  Wash your hands often and after each diarrhea episode. If soap and water are not available, use   hand sanitizer.  It is important that you treat your diarrhea as told by your health care provider. This information is not intended to replace advice given to you by your health care provider. Make sure you discuss any questions you have with your health care provider. Document Released: 04/15/2003 Document Revised: 12/13/2015 Document Reviewed: 12/13/2015 Elsevier Interactive Patient Education  2017 Elsevier Inc.  

## 2017-08-30 NOTE — Progress Notes (Signed)
Subjective:  Patient ID: Tasha [name], female    DOB: December 03, 1960  Age: 57 y.o. MRN: [id number]  CC: Hypothyroidism; Hyperlipidemia; Diabetes; and Annual Exam   HPI CHANCY CLAROS presents for fu/p - she complains of diarrhea for several months.  She thought it was caused by the Ozempic so she stopped using it but the diarrhea persists.  She does not consume dairy products.  She has not noticed any association with products containing gluten.  She says she has rare cramping but never sees blood in her stools.  She describes it as about 3 watery stools per day.  She also complains of fatigue and excessive emotional stress.  Past Medical History:  Diagnosis Date  . Anxiety state, unspecified   . Arthralgia of temporomandibular joint   . Conversion disorder   . Depressive disorder, not elsewhere classified   . Dysphagia, unspecified(787.20)   . Endometriosis   . Esophageal reflux   . Headache(784.0)   . Irritable bowel syndrome   . Mitral valve disorders(424.0)   . Myalgia and myositis, unspecified   . Overweight(278.02)   . Periodic limb movement disorder   . Pure hypercholesterolemia   . Unspecified hypothyroidism    Past Surgical History:  Procedure Laterality Date  . ABDOMINAL HYSTERECTOMY     TAH  . arm surgery    . FOOT SURGERY    . LIPOMA EXCISION     BESIDES BELLY BUTTON  . TONSILLECTOMY    . WRIST SURGERY      reports that she has been smoking cigarettes.  She has been smoking about 1.00 pack per day. She has never used smokeless tobacco. She reports that she does not drink alcohol or use drugs. family history includes Breast cancer in her maternal aunt; Colon cancer in her paternal aunt; Diabetes in her father; Heart disease in her father and mother. Allergies  Allergen Reactions  . Wilder Glade [Dapagliflozin] Other (See Comments)    Vag yeast inf  . Statins Other (See Comments)    Muscle weakness/pains  . Crestor [Rosuvastatin] Other (See Comments)    Muscle aches    . Lipitor [Atorvastatin] Other (See Comments)    Muscle aches  . Zocor [Simvastatin] Other (See Comments)    Leg pain per patient/PE    Outpatient Medications Prior to Visit  Medication Sig Dispense Refill  . ALPRAZolam (XANAX) 0.5 MG tablet TAKE 1 TABLET BY MOUTH 4 TIMES DAILY AS NEEDED 90 tablet 0  . aspirin EC 81 MG tablet Take 1 tablet (81 mg total) by mouth daily. 90 tablet 3  . buPROPion (WELLBUTRIN XL) 150 MG 24 hr tablet Take 1 tablet (150 mg total) by mouth daily. 90 tablet 1  . Cholecalciferol (VITAMIN D3) 50000 units CAPS TAKE 1 CAPSULE BY MOUTH ONCE A WEEK 12 capsule 2  . Fluticasone-Umeclidin-Vilant (TRELEGY ELLIPTA) 100-62.5-25 MCG/INH AEPB Inhale 1 puff into the lungs daily. 30 each 11  . levothyroxine (SYNTHROID, LEVOTHROID) 125 MCG tablet Take 1 tablet (125 mcg total) by mouth daily. 90 tablet 0  . NARCAN 4 MG/0.1ML LIQD nasal spray kit   2  . omeprazole (PRILOSEC) 40 MG capsule Take 2 capsules (80 mg total) by mouth daily. 60 capsule 11  . promethazine (PHENERGAN) 12.5 MG tablet Take 1 tablet (12.5 mg total) by mouth every 6 (six) hours as needed for nausea or vomiting. 30 tablet 0  . sertraline (ZOLOFT) 100 MG tablet TAKE 2 TABLETS BY MOUTH DAILY. 180 tablet 0  . traZODone (DESYREL)  100 MG tablet Take 1 tablet (100 mg total) by mouth at bedtime. 90 tablet 1  . cyclobenzaprine (FLEXERIL) 10 MG tablet Take 1 tablet (10 mg total) by mouth 3 (three) times daily as needed for muscle spasms. 30 tablet 0  . ezetimibe (ZETIA) 10 MG tablet TAKE 1 TABLET BY MOUTH ONCE DAILY 90 tablet 1  . HYDROcodone-acetaminophen (NORCO/VICODIN) 5-325 MG tablet TAKE 1 TABLET BY MOUTH EVERY 6 HOURS AS NEEDED. NOT TO EXCEED 4 TABLETS A DAY. 75 tablet 0  . metFORMIN (GLUCOPHAGE) 1000 MG tablet Take 1 tablet (1,000 mg total) by mouth 2 (two) times daily with a meal. 180 tablet 1  . Semaglutide (OZEMPIC) 0.25 or 0.5 MG/DOSE SOPN Inject 0.5 mg into the skin once a week. 9 mL 1  . Cholecalciferol 50000  UNITS TABS Take 1 tablet by mouth once a week. (Patient taking differently: Take 1 tablet by mouth every Monday. ) 12 tablet 3   No facility-administered medications prior to visit.     ROS Review of Systems  Constitutional: Positive for fatigue. Negative for diaphoresis and unexpected weight change.  HENT: Negative.  Negative for trouble swallowing.   Eyes: Negative for visual disturbance.  Respiratory: Negative for cough, chest tightness, shortness of breath and wheezing.   Cardiovascular: Negative for chest pain, palpitations and leg swelling.  Gastrointestinal: Positive for diarrhea. Negative for abdominal pain, constipation, nausea and vomiting.  Genitourinary: Negative.  Negative for difficulty urinating and dysuria.  Musculoskeletal: Negative.   Skin: Negative.  Negative for color change, pallor and rash.  Neurological: Negative.  Negative for dizziness, weakness and light-headedness.  Hematological: Negative for adenopathy. Does not bruise/bleed easily.  Psychiatric/Behavioral: Negative for decreased concentration, dysphoric mood, self-injury, sleep disturbance and suicidal ideas. The patient is nervous/anxious.     Objective:  BP 134/76 (BP Location: Left Arm, Patient Position: Sitting, Cuff Size: Normal)   Pulse 67   Temp 98.2 F (36.8 C) (Oral)   Resp 16   Ht 5' 7"  (1.702 m)   Wt 168 lb (76.2 kg)   SpO2 94%   BMI 26.31 kg/m   BP Readings from Last 3 Encounters:  08/30/17 134/76  03/19/17 130/70  02/16/17 138/88    Wt Readings from Last 3 Encounters:  08/30/17 168 lb (76.2 kg)  03/19/17 171 lb (77.6 kg)  02/16/17 171 lb 12.8 oz (77.9 kg)    Physical Exam  Constitutional: She is oriented to person, place, and time. No distress.  HENT:  Mouth/Throat: Oropharynx is clear and moist. No oropharyngeal exudate.  Eyes: Conjunctivae are normal. No scleral icterus.  Neck: Normal range of motion. Neck supple. No JVD present. No thyromegaly present.  Cardiovascular:  Normal rate, regular rhythm and normal heart sounds. Exam reveals no gallop.  No murmur heard. Pulmonary/Chest: Effort normal and breath sounds normal. She has no wheezes. She has no rhonchi. She has no rales.  Abdominal: Soft. Normal appearance. She exhibits no mass. Bowel sounds are increased. There is no hepatosplenomegaly. There is no tenderness.  Musculoskeletal: Normal range of motion. She exhibits no edema, tenderness or deformity.  Lymphadenopathy:    She has no cervical adenopathy.  Neurological: She is alert and oriented to person, place, and time.  Skin: Skin is warm and dry. She is not diaphoretic. No pallor.  Psychiatric: She has a normal mood and affect. Her behavior is normal. Judgment and thought content normal.  Vitals reviewed.   Lab Results  Component Value Date   WBC 7.5 04/10/2016  HGB 14.7 04/10/2016   HCT 43.7 04/10/2016   PLT 175.0 04/10/2016   GLUCOSE 118 (H) 08/30/2017   CHOL 258 (H) 08/30/2017   TRIG 270.0 (H) 08/30/2017   HDL 36.90 (L) 08/30/2017   LDLDIRECT 176.0 08/30/2017   LDLCALC 149 (H) 04/10/2016   ALT 43 (H) 08/30/2017   AST 35 08/30/2017   NA 139 08/30/2017   K 3.9 08/30/2017   CL 105 08/30/2017   CREATININE 0.80 08/30/2017   BUN 12 08/30/2017   CO2 26 08/30/2017   TSH 2.39 08/30/2017   HGBA1C 7.1 (H) 08/30/2017   MICROALBUR <0.7 10/24/2016    Dg Chest 2 View  Result Date: 10/24/2016 CLINICAL DATA:  Cough, wheezing, some chest pain, smoking his EXAM: CHEST  2 VIEW COMPARISON:  Chest x-ray of 10/13/2015 FINDINGS: No active infiltrate or effusion is seen. There is mild peribronchial thickening which may indicate bronchitis. Mediastinal and hilar contours are unremarkable. The heart is within normal limits in size. There are mild degenerative changes in the lower thoracic spine. IMPRESSION: No pneumonia or effusion.  Question mild bronchitis. Electronically Signed   By: Ivar Drape M.D.   On: 10/24/2016 09:38    Assessment & Plan:   [name]  was seen today for hypothyroidism, hyperlipidemia and diabetes.  Diagnoses and all orders for this visit:  Breast cancer screening -     MM Digital Screening; Future  Long-term use of high-risk medication -     Pain Mgmt, Profile 8 w/Conf, U; Future  Acquired hypothyroidism- Her TSH is in the normal range.  She will remain on the current dose of levothyroxine. -     TSH; Future  Type 2 diabetes mellitus with complication, without long-term current use of insulin ([place])- Her A1c is at 7.1%.  She is experiencing diarrhea so we will hold the metformin for now.  Her blood sugars are adequately well controlled. -     Comprehensive metabolic panel; Future -     Hemoglobin A1c; Future  Hyperlipidemia with target LDL less than 130- She has not achieved her LDL goal.  She is not willing to take a statin. -     Lipid panel; Future -     Hemoglobin A1c; Future  Visit for screening mammogram  Routine general medical examination at a health care facility  Diarrhea, unspecified type- I will empirically treat her for EPI with a course of pancrelipase.  Will also screen for celiac disease.  I have asked her to submit a stool specimen to screen for enteric pathogens.  She will stop taking metformin as this may be causing her diarrhea.  -     Gliadin antibodies, serum; Future -     Tissue transglutaminase, IgA; Future -     Reticulin Antibody, IgA w reflex titer; Future -     GI pathogen panel by PCR, stool; Future -     Fecal lactoferrin, quant; Future -     Pancreatic elastase, fecal; Future -     Pancrelipase, Lip-Prot-Amyl, (ZENPEP) 40000-126000 units CPEP; Take 1 capsule by mouth 3 (three) times daily with meals.   I have discontinued Glenard Haring. Bhattacharyya's Cholecalciferol, cyclobenzaprine, Semaglutide, ezetimibe, metFORMIN, and HYDROcodone-acetaminophen. I am also having her start on Pancrelipase (Lip-Prot-Amyl). Additionally, I am having her maintain her aspirin EC, omeprazole,  Fluticasone-Umeclidin-Vilant, sertraline, buPROPion, traZODone, promethazine, Vitamin D3, levothyroxine, ALPRAZolam, and NARCAN.  Meds ordered this encounter  Medications  . Pancrelipase, Lip-Prot-Amyl, (ZENPEP) 40000-126000 units CPEP    Sig: Take 1 capsule by  mouth 3 (three) times daily with meals.    Dispense:  120 capsule    Refill:  1   See AVS for instructions about healthy living and anticipatory guidance.  Follow-up: Return in about 1 month (around 09/27/2017).  [name], MD

## 2017-09-03 NOTE — Addendum Note (Signed)
Addended by: Aviva Signs M on: 09/03/2017 04:56 PM   Modules accepted: Orders

## 2017-09-03 NOTE — Assessment & Plan Note (Signed)

## 2017-09-06 ENCOUNTER — Encounter: Payer: Medicare Other | Admitting: Psychology

## 2017-09-07 ENCOUNTER — Telehealth: Payer: Self-pay | Admitting: Neurology

## 2017-09-07 ENCOUNTER — Ambulatory Visit (INDEPENDENT_AMBULATORY_CARE_PROVIDER_SITE_OTHER): Payer: Medicare Other | Admitting: Neurology

## 2017-09-07 ENCOUNTER — Encounter: Payer: Self-pay | Admitting: Neurology

## 2017-09-07 VITALS — BP 98/62 | HR 64 | Ht 67.0 in | Wt 167.5 lb

## 2017-09-07 DIAGNOSIS — R413 Other amnesia: Secondary | ICD-10-CM | POA: Diagnosis not present

## 2017-09-07 DIAGNOSIS — E538 Deficiency of other specified B group vitamins: Secondary | ICD-10-CM

## 2017-09-07 DIAGNOSIS — G43001 Migraine without aura, not intractable, with status migrainosus: Secondary | ICD-10-CM | POA: Diagnosis not present

## 2017-09-07 DIAGNOSIS — R799 Abnormal finding of blood chemistry, unspecified: Secondary | ICD-10-CM | POA: Diagnosis not present

## 2017-09-07 MED ORDER — PROPRANOLOL HCL 20 MG PO TABS: 20.0000 mg | ORAL_TABLET | Freq: Two times a day (BID) | ORAL | 3 refills | Status: DC

## 2017-09-07 MED FILL — PROPRANOLOL 20 MG TABLET: 20 | 30 days supply | Qty: 60 | Fill #0

## 2017-09-07 NOTE — Telephone Encounter (Signed)
[employer] and [employer] patients MRI sent to [facility]. DW

## 2017-09-07 NOTE — Progress Notes (Signed)
Reason for visit: Memory disturbance, visual changes  Referring physician: Dr. Wanita Chamberlain is a 57 y.o. female  History of present illness:  [name] is a 57 year old right-handed white female with a history of anxiety and depression.  The patient comes into the office today indicating a 10-year history of some troubles with memory.  The patient believes that her memory issues have gotten slightly worse over time.  She has some trouble with short-term memory, difficulty with names and some word finding problems.  She is able to operate a motor vehicle without difficulty.  She is able to manage her medications and appointments.  She will go into rooms and cannot remember why she went there.  She has some intermittent numbness of the arms that may come and go, occasionally affecting the legs and feet as well.  The patient at times feels weak all over, she has some mild balance issues with occasional falls.  She denies issues controlling the bladder, she did have a recent episode of diarrhea associated with a medication side effect.  She also over the last 2 to 3 weeks has had episodes of visual dimming or loss that may occur with increased stress.  The patient claims that her husband retired a year ago and she has had increased stress associated with this.  The patient will feel anxious and nervous, if she takes Xanax this will help the visual episode to resolve quickly.  The patient is also now getting headaches about every other day, often times associated with the episodes of visual disturbance.  The visual disturbance may last up to 20 to 30 minutes.  The patient claims that she had very similar events spanning a 10-year timeframe beginning in 1992.  The patient has had recent recurrence of similar symptoms.  The patient does have a history of migraine headache.  The patient comes to this office for an evaluation.  Past Medical History:  Diagnosis Date  . Anxiety state, unspecified   .  Arthralgia of temporomandibular joint   . Conversion disorder   . Dementia   . Depressive disorder, not elsewhere classified   . Dysphagia, unspecified(787.20)   . Endometriosis   . Esophageal reflux   . Headache(784.0)   . Irritable bowel syndrome   . Mitral valve disorders(424.0)   . Myalgia and myositis, unspecified   . Overweight(278.02)   . Periodic limb movement disorder   . Pure hypercholesterolemia   . Unspecified hypothyroidism     Past Surgical History:  Procedure Laterality Date  . ABDOMINAL HYSTERECTOMY     TAH  . arm surgery    . FOOT SURGERY    . LIPOMA EXCISION     BESIDES BELLY BUTTON  . TONSILLECTOMY    . WRIST SURGERY      Family History  Problem Relation Age of Onset  . Heart disease Mother   . Heart disease Father   . Diabetes Father   . Breast cancer Maternal Aunt   . Colon cancer Paternal Aunt     Social history:  reports that she has been smoking cigarettes.  She has been smoking about 1.00 pack per day. She has never used smokeless tobacco. She reports that she drinks alcohol. She reports that she does not use drugs.  Medications:  Prior to Admission medications   Medication Sig Start Date End Date Taking? Authorizing Provider  ALPRAZolam (XANAX) 0.5 MG tablet TAKE 1 TABLET BY MOUTH 4 TIMES DAILY AS NEEDED 08/16/17  Yes Janith Lima, MD  aspirin EC 81 MG tablet Take 1 tablet (81 mg total) by mouth daily. 04/10/16  Yes Janith Lima, MD  buPROPion (WELLBUTRIN XL) 150 MG 24 hr tablet Take 1 tablet (150 mg total) by mouth daily. 03/19/17  Yes Janith Lima, MD  Cholecalciferol (VITAMIN D3) 50000 units CAPS TAKE 1 CAPSULE BY MOUTH ONCE A WEEK 05/21/17  Yes Janith Lima, MD  levothyroxine (SYNTHROID, LEVOTHROID) 125 MCG tablet Take 1 tablet (125 mcg total) by mouth daily. 08/16/17  Yes Janith Lima, MD  NARCAN 4 MG/0.1ML LIQD nasal spray kit  07/16/17  Yes [provider]  omeprazole (PRILOSEC) 40 MG capsule Take 2 capsules (80 mg  total) by mouth daily. 10/17/16  Yes Janith Lima, MD  Pancrelipase, Lip-Prot-Amyl, (ZENPEP) 40000-126000 units CPEP Take 1 capsule by mouth 3 (three) times daily with meals. 08/30/17  Yes Janith Lima, MD  sertraline (ZOLOFT) 100 MG tablet TAKE 2 TABLETS BY MOUTH DAILY. 03/15/17  Yes Janith Lima, MD  propranolol (INDERAL) 20 MG tablet Take 1 tablet (20 mg total) by mouth 2 (two) times daily. 09/07/17   Kathrynn Ducking, MD      Allergies  Allergen Reactions  . Wilder Glade [Dapagliflozin] Other (See Comments)    Vag yeast inf  . Statins Other (See Comments)    Muscle weakness/pains  . Crestor [Rosuvastatin] Other (See Comments)    Muscle aches  . Lipitor [Atorvastatin] Other (See Comments)    Muscle aches  . Zocor [Simvastatin] Other (See Comments)    Leg pain per patient/PE    ROS:  Out of a complete 14 system review of symptoms, the patient complains only of the following symptoms, and all other reviewed systems are negative.  Fatigue, excessive sweating Eye redness, light sensitivity, double vision, loss of vision, blurred vision Heat intolerance, excessive thirst  Blood pressure 98/62, pulse 64, height 5' 7"  (1.702 m), weight 167 lb 8 oz (76 kg).  Physical Exam  General: The patient is alert and cooperative at the time of the examination.  Eyes: Pupils are equal, round, and reactive to light. Discs are flat bilaterally.  Neck: The neck is supple, no carotid bruits are noted.  Respiratory: The respiratory examination is clear.  Cardiovascular: The cardiovascular examination reveals a regular rate and rhythm, no obvious murmurs or rubs are noted.  Skin: Extremities are without significant edema.  Neurologic Exam  Mental status: The patient is alert and oriented x 3 at the time of the examination. The patient has apparent normal recent and remote memory, with an apparently normal attention span and concentration ability.  Mini-Mental status examination done today  shows a total score 30/30.  Cranial nerves: Facial symmetry is present. There is good sensation of the face to pinprick and soft touch bilaterally. The strength of the facial muscles and the muscles to head turning and shoulder shrug are normal bilaterally. Speech is well enunciated, no aphasia or dysarthria is noted. Extraocular movements are full. Visual fields are full. The tongue is midline, and the patient has symmetric elevation of the soft palate. No obvious hearing deficits are noted.  Motor: The motor testing reveals 5 over 5 strength of all 4 extremities. Good symmetric motor tone is noted throughout.  Sensory: Sensory testing is intact to pinprick, soft touch, vibration sensation, and position sense on all 4 extremities, with exception some decreased vibration sensation on the right foot, decreased pinprick sensation on the left foot. No evidence  of extinction is noted.  Coordination: Cerebellar testing reveals good finger-nose-finger and heel-to-shin bilaterally.  Gait and station: Gait is normal. Tandem gait is normal. Romberg is negative. No drift is seen.  Reflexes: Deep tendon reflexes are symmetric and normal bilaterally. Toes are downgoing bilaterally.   Assessment/Plan:  1.  History of anxiety and depression  2.  Reported memory disturbance  3.  Episodic headaches and visual disturbances  4.  History of migraine  The patient is having episodes of visual dimming associated with heightened anxiety.  Use of Xanax is helpful for these events.  The patient is having increased headaches, it is possible that the visual events may be related to migraine.  The patient is also reporting a low-grade memory disorder that spans over the last 10 years.  The patient will undergo blood work today, she will have MRI of the brain.  She will be placed on propranolol taking 20 mg twice daily, she will call for any dose adjustments.  She is to watch out for increasing problems with depression  on this medication.  Propranolol may help her anxiety.  She will follow-up in 4 months.  Jill Alexanders MD 09/07/2017 8:53 AM  [facility] [address] [place], [place] [address]  Phone [phone] Fax [phone]

## 2017-09-11 ENCOUNTER — Telehealth: Payer: Self-pay | Admitting: Neurology

## 2017-09-11 LAB — PAIN MGMT, PROFILE 8 W/CONF, U
6 Acetylmorphine: NEGATIVE ng/mL (ref <10)
Alcohol Metabolites: NEGATIVE ng/mL (ref <500)
Alphahydroxyalprazolam: 174 ng/mL — ABNORMAL HIGH (ref <25)
Alphahydroxymidazolam: NEGATIVE ng/mL (ref <50)
Alphahydroxytriazolam: NEGATIVE ng/mL (ref <50)
Aminoclonazepam: NEGATIVE ng/mL (ref <25)
Amphetamines: NEGATIVE ng/mL (ref <500)
Benzodiazepines: POSITIVE ng/mL — AB (ref <100)
Buprenorphine, Urine: NEGATIVE ng/mL (ref <5)
Cocaine Metabolite: NEGATIVE ng/mL (ref <150)
Creatinine: 129.8 mg/dL
Hydroxyethylflurazepam: NEGATIVE ng/mL (ref <50)
Lorazepam: NEGATIVE ng/mL (ref <50)
MDMA: NEGATIVE ng/mL (ref <500)
Marijuana Metabolite: NEGATIVE ng/mL (ref <20)
Nordiazepam: NEGATIVE ng/mL (ref <50)
Opiates: NEGATIVE ng/mL (ref <100)
Oxazepam: NEGATIVE ng/mL (ref <50)
Oxidant: NEGATIVE ug/mL (ref <200)
Oxycodone: NEGATIVE ng/mL (ref <100)
Temazepam: NEGATIVE ng/mL (ref <50)
pH: 6.19 (ref 4.5–9.0)

## 2017-09-11 LAB — RETICULIN ANTIBODIES, IGA W TITER: Reticulin IGA Screen: NEGATIVE

## 2017-09-11 LAB — GLIADIN ANTIBODIES, SERUM
Gliadin IgA: 20 Units — ABNORMAL HIGH
Gliadin IgG: 3 Units

## 2017-09-11 LAB — HIV 1/2 AB DIFFERENTIATION
HIV 1 Ab: NEGATIVE
HIV 2 Ab: NEGATIVE
NOTE (HIV CONF MULTIP: NEGATIVE

## 2017-09-11 LAB — RNA QUALITATIVE: HIV 1 RNA Qualitative: NEGATIVE

## 2017-09-11 LAB — TISSUE TRANSGLUTAMINASE, IGA: (tTG) Ab, IgA: 1 U/mL

## 2017-09-11 LAB — VITAMIN B12: Vitamin B-12: 869 pg/mL (ref 232–1245)

## 2017-09-11 LAB — HIV ANTIBODY (ROUTINE TESTING W REFLEX): HIV Screen 4th Generation wRfx: REACTIVE — AB

## 2017-09-11 LAB — RPR: RPR Ser Ql: NONREACTIVE

## 2017-09-11 MED ORDER — DIVALPROEX SODIUM 500 MG PO DR TAB: DELAYED_RELEASE_TABLET | ORAL | 2 refills | Status: DC

## 2017-09-11 MED FILL — DIVALPROEX SOD DR 500 MG TA: 500 | 37 days supply | Qty: 60 | Fill #0

## 2017-09-11 NOTE — Telephone Encounter (Signed)
Pt requesting a call from Dr. Jannifer Franklin stating she would like to go over the results again

## 2017-09-11 NOTE — Telephone Encounter (Signed)
I called the patient back, I went over the results of the testing again, we will recheck the blood work in 2 months.

## 2017-09-11 NOTE — Telephone Encounter (Signed)
I called the patient.  The fourth-generation HIV screen was positive, the antibodies were negative.  This either means a recent exposure or a false positive.  We will wait 2 months, recheck the blood work, if the same findings are noted, a false positive result is likely.  The patient indicates that the propranolol is causing headaches every time she takes the medication, we will stop the drug and start Depakote.

## 2017-09-13 MED FILL — OMEPRAZOLE 40 MG CPDR: 40 | 30 days supply | Qty: 60 | Fill #8

## 2017-09-27 ENCOUNTER — Other Ambulatory Visit: Payer: Medicare Other

## 2017-09-27 ENCOUNTER — Telehealth: Payer: Self-pay | Admitting: Internal Medicine

## 2017-09-27 ENCOUNTER — Other Ambulatory Visit: Payer: Self-pay | Admitting: Internal Medicine

## 2017-09-27 DIAGNOSIS — Z21 Asymptomatic human immunodeficiency virus [HIV] infection status: Secondary | ICD-10-CM | POA: Diagnosis not present

## 2017-09-27 DIAGNOSIS — R197 Diarrhea, unspecified: Secondary | ICD-10-CM

## 2017-09-27 DIAGNOSIS — F418 Other specified anxiety disorders: Secondary | ICD-10-CM

## 2017-09-27 MED FILL — ALPRAZolam 0.5 MG TABS: 0.5 | 23 days supply | Qty: 90 | Fill #0

## 2017-09-27 MED FILL — buPROPion HCL ER (XL) 150 M: 150 | 30 days supply | Qty: 30 | Fill #0

## 2017-09-27 NOTE — Telephone Encounter (Signed)
Copied from [place] [id number]. Topic: General - Other >> Sep 27, 2017 11:43 AM Cecelia Byars, NT wrote: Reason for CRM: Patient called and said she is bringing a sample to the practice and would like to get labs done in process, there is no order

## 2017-09-27 NOTE — Telephone Encounter (Signed)
Per PCP, order has been placed. Pt has been informed.

## 2017-09-27 NOTE — Telephone Encounter (Signed)
Pt contaced and she is requesting to have a repeat HIV panel. The first came back positive. Please advise if this can be ordered without an OV.

## 2017-10-01 ENCOUNTER — Ambulatory Visit (INDEPENDENT_AMBULATORY_CARE_PROVIDER_SITE_OTHER): Payer: Medicare Other | Admitting: Internal Medicine

## 2017-10-01 ENCOUNTER — Ambulatory Visit: Payer: Self-pay | Admitting: *Deleted

## 2017-10-01 ENCOUNTER — Encounter: Payer: Self-pay | Admitting: Internal Medicine

## 2017-10-01 DIAGNOSIS — M25561 Pain in right knee: Secondary | ICD-10-CM

## 2017-10-01 NOTE — Telephone Encounter (Signed)
Pt called with having swelling since last Saturday.  She stated the pain and swelling  is above her right knee cap and goes around to the right of her leg and up into her thigh. She denies redness. It is tender to touch and gets worse when she is up on her feet.  She wonders if it is a new medication side effect. She was started on Depakote by Dr. Jannifer Franklin, on August 6 th and thinks this may be the problem.  She denies shortness of breath, but she had chest pain last Wednesday that felt like electricity was running thru her heart. It was when she was lying in the bed. She has hx of indigestion. Not sure what it was. She has several issues today,  her father is in hospice care and is dying. And she thinks she may have allergies or a cold.  She wants to be well when her father passes. Expecting him to pass soon.  Sympathy expressed.  Notified flow at [facility] regarding time slot for providers.  Appointment scheduled. Advised patient that all of her symptoms may not be address because of time. Advised pt to go to an emergency department if her symptoms increase, which include not able to walk and shortness of breath.   Pt voiced understanding.   Reason for Disposition . [1] Thigh, calf, or ankle swelling AND [2] only 1 side  Answer Assessment - Initial Assessment Questions 1. LOCATION: "Where is the swelling located?"  (e.g., left, right, both knees)     Above right knee on the right side 2. SIZE and DESCRIPTION: "What does the swelling look like?"  (e.g., entire knee, localized)     Entire above knee 3. ONSET: "When did the swelling start?" "Does it come and go, or is it there all the time?"     Friday night 4. PAIN: "Is there any pain?" If so, ask: "How bad is it?" (Scale 1-10; or mild, moderate, severe)     Yes and pain# 4 and 7 when it is touched 5. SETTING: "Has there been any recent work, exercise or other activity that involved that part of the body?"      no 6. AGGRAVATING FACTORS:  "What makes the knee swelling worse?" (e.g., walking, climbing stairs, running)     Walking, moving that knee 7. ASSOCIATED SYMPTOMS: "Is there any pain or redness?"     pain 8. OTHER SYMPTOMS: "Do you have any other symptoms?" (e.g., chest pain, difficulty breathing, fever, calf pain)     Had chest pain last Wednesday that felt like electricity running in her heart, when she started taking a new medication  9. PREGNANCY: "Is there any chance you are pregnant?" "When was your last menstrual period?"     No period  Protocols used: KNEE SWELLING-A-AH

## 2017-10-01 NOTE — Patient Instructions (Signed)
We think that a baker's cyst ruptured. You can use tylenol and ice for the pain.    Baker Cyst A Baker cyst, also called a popliteal cyst, is a sac-like growth that forms at the back of the knee. The cyst forms when the fluid-filled sac (bursa) that cushions the knee joint becomes enlarged. The bursa that becomes a Baker cyst is located at the back of the knee joint. What are the causes? In most cases, a Baker cyst results from another knee problem that causes swelling inside the knee. This makes the fluid inside the knee joint (synovial fluid) flow into the bursa behind the knee, causing the bursa to enlarge. What increases the risk? You may be more likely to develop a Baker cyst if you already have a knee problem, such as:  A tear in cartilage that cushions the knee joint (meniscal tear).  A tear in the tissues that connect the bones of the knee joint (ligament tear).  Knee swelling from osteoarthritis, rheumatoid arthritis, or gout.  What are the signs or symptoms? A Baker cyst does not always cause symptoms. A lump behind the knee may be the only sign of the condition. The lump may be painful, especially when the knee is straightened. If the lump is painful, the pain may come and go. The knee may also be stiff. Symptoms may quickly get more severe if the cyst breaks open (ruptures). If your cyst ruptures, signs and symptoms may affect the knee and the back of the lower leg (calf) and may include:  Sudden or worsening pain.  Swelling.  Bruising.  How is this diagnosed? This condition may be diagnosed based on your symptoms and medical history. Your health care provider will also do a physical exam. This may include:  Feeling the cyst to check whether it is tender.  Checking your knee for signs of another knee condition that causes swelling.  You may have imaging tests, such as:  X-rays.  MRI.  Ultrasound.  How is this treated? A Baker cyst that is not painful may go away  without treatment. If the cyst gets large or painful, it will likely get better if the underlying knee problem is treated. Treatment for a Baker cyst may include:  Resting.  Keeping weight off of the knee. This means not leaning on the knee to support your body weight.  NSAIDs to reduce pain and swelling.  A procedure to drain the fluid from the cyst with a needle (aspiration). You may also get an injection of a medicine that reduces swelling (steroid).  Surgery. This may be needed if other treatments do not work. This usually involves correcting knee damage and removing the cyst.  Follow these instructions at home:  Take over-the-counter and prescription medicines only as told by your health care provider.  Rest and return to your normal activities as told by your health care provider. Avoid activities that make pain or swelling worse. Ask your health care provider what activities are safe for you.  Keep all follow-up visits as told by your health care provider. This is important. Contact a health care provider if:  You have knee pain, stiffness, or swelling that does not get better. Get help right away if:  You have sudden or worsening pain and swelling in your calf area. This information is not intended to replace advice given to you by your health care provider. Make sure you discuss any questions you have with your health care provider. Document Released: 01/23/2005  Document Revised: 10/14/2015 Document Reviewed: 10/14/2015 Elsevier Interactive Patient Education  [employer].

## 2017-10-01 NOTE — Assessment & Plan Note (Addendum)
Exam fairly benign. No effusion or swelling detected. Could be mild baker's cyst although not detected on exam today. Patient states she used to have a bulge back of her knee which is now gone since Friday at onset of pain. Advised ice and tylenol otc for pain. Call back if worsening or not improved. No imaging recommended. Offered NSAID which she declines.

## 2017-10-01 NOTE — Progress Notes (Signed)
   Subjective:    Patient ID: Tasha [name], female    DOB: 11/28/1960, 57 y.o.   MRN: [id number]  HPI The patient is a 57 YO female coming in for leg swelling. Pain in right knee and radiates to her hips. She denies injury or overuse. Started Friday night while sitting. Painful to sit, stand, walk, stairs. Cannot recall any triggers. Overall it is improving. Some swelling initially. Pain is on the back of the knee and top of the knee. Does not give out on her at all. Recent HIV positive testing with quant negative. She is getting retested now.   Review of Systems  Constitutional: Positive for activity change. Negative for appetite change, chills, fatigue, fever and unexpected weight change.  Respiratory: Negative.   Cardiovascular: Negative.   Gastrointestinal: Negative.   Musculoskeletal: Positive for arthralgias, joint swelling and myalgias. Negative for back pain and gait problem.  Skin: Negative.   Neurological: Negative.       Objective:   Physical Exam  Constitutional: She is oriented to person, place, and time. She appears well-developed and well-nourished.  HENT:  Head: Normocephalic and atraumatic.  Eyes: EOM are normal.  Neck: Normal range of motion.  Cardiovascular: Normal rate and regular rhythm.  Pulmonary/Chest: Effort normal and breath sounds normal. No respiratory distress. She has no wheezes. She has no rales.  Abdominal: Soft. Bowel sounds are normal. She exhibits no distension. There is no tenderness. There is no rebound.  Musculoskeletal: She exhibits no edema.  Right knee with ACL and PCL intact, no tenderness along the medial or lateral ligament. No calf tenderness or swelling. Some posterior tenderness on the knee and superior tenderness above the knee. No rash or bruising on skin.  Neurological: She is alert and oriented to person, place, and time. Coordination normal.  Skin: Skin is warm and dry.  Psychiatric: She has a normal mood and affect.   Vitals:   10/01/17 1302  BP: 110/60  Pulse: 68  Temp: 97.9 F (36.6 C)  TempSrc: Oral  SpO2: 96%  Weight: 170 lb (77.1 kg)  Height: 5\' 7"  (1.702 m)      Assessment & Plan:

## 2017-10-02 ENCOUNTER — Encounter: Payer: Self-pay | Admitting: Internal Medicine

## 2017-10-02 ENCOUNTER — Ambulatory Visit
Admission: RE | Admit: 2017-10-02 | Discharge: 2017-10-02 | Disposition: A | Discharge status: Home / Self Care | Payer: Medicare Other | Source: Ambulatory Visit | Attending: Neurology | Admitting: Neurology

## 2017-10-02 DIAGNOSIS — R413 Other amnesia: Secondary | ICD-10-CM | POA: Diagnosis not present

## 2017-10-02 LAB — HIV-1 RNA ULTRAQUANT REFLEX TO GENTYP+
HIV 1 RNA Quant: <20 Copies/mL
HIV-1 RNA Quant, Log: <1.3 Log cps/mL

## 2017-10-03 LAB — GASTROINTESTINAL PATHOGEN PANEL PCR
C. difficile Tox A/B, PCR: NOT DETECTED
Campylobacter, PCR: NOT DETECTED
Cryptosporidium, PCR: NOT DETECTED
E coli (ETEC) LT/ST PCR: NOT DETECTED
E coli (STEC) stx1/stx2, PCR: NOT DETECTED
E coli 0157, PCR: NOT DETECTED
Giardia lamblia, PCR: NOT DETECTED
Norovirus, PCR: NOT DETECTED
Rotavirus A, PCR: NOT DETECTED
Salmonella, PCR: NOT DETECTED
Shigella, PCR: NOT DETECTED

## 2017-10-03 LAB — FECAL LACTOFERRIN, QUANT
Fecal Lactoferrin: POSITIVE — AB
MICRO NUMBER:: 91003396
SPECIMEN QUALITY:: ADEQUATE

## 2017-10-03 LAB — PANCREATIC ELASTASE, FECAL: Pancreatic Elastase-1, Stool: 278 mcg/g

## 2017-10-04 ENCOUNTER — Encounter: Payer: Self-pay | Admitting: Internal Medicine

## 2017-10-04 ENCOUNTER — Telehealth: Payer: Self-pay | Admitting: Neurology

## 2017-10-04 NOTE — Telephone Encounter (Signed)
  I called the patient.  MRI of the brain was normal. Mild pansinusitis is seen.  MRI brain 10/04/17:  IMPRESSION:   Unremarkable MRI brain (without). Mild chronic pansinusitis. No acute findings.

## 2017-10-17 ENCOUNTER — Ambulatory Visit: Payer: Medicare Other

## 2017-10-18 ENCOUNTER — Other Ambulatory Visit: Payer: Self-pay | Admitting: Internal Medicine

## 2017-10-18 DIAGNOSIS — M25561 Pain in right knee: Secondary | ICD-10-CM | POA: Diagnosis not present

## 2017-10-18 DIAGNOSIS — M545 Low back pain, unspecified: Secondary | ICD-10-CM | POA: Insufficient documentation

## 2017-10-18 DIAGNOSIS — K219 Gastro-esophageal reflux disease without esophagitis: Secondary | ICD-10-CM

## 2017-10-18 MED FILL — predniSONE 10 MG (21) TBPK: 10 | 6 days supply | Qty: 21 | Fill #0

## 2017-10-18 MED FILL — METHOCARBAMOL 500 MG TABLET: 500 | 15 days supply | Qty: 60 | Fill #0

## 2017-10-18 MED FILL — ALPRAZolam 0.5 MG TABS: 0.5 | 23 days supply | Qty: 90 | Fill #1

## 2017-10-18 MED FILL — OMEPRAZOLE 40 MG CPDR: 40 | 30 days supply | Qty: 60 | Fill #0

## 2017-10-25 MED FILL — buPROPion HCL ER (XL) 150 M: 150 | 30 days supply | Qty: 30 | Fill #1

## 2017-10-31 MED FILL — metFORMIN HCL 1000 MG TABS: 1000 | 90 days supply | Qty: 180 | Fill #1

## 2017-10-31 MED FILL — AMOXICILLIN 500 MG CAPSULE: 500 | 7 days supply | Qty: 21 | Fill #0

## 2017-11-08 MED FILL — ALPRAZolam 0.5 MG TABS: 0.5 | 23 days supply | Qty: 90 | Fill #2

## 2017-11-09 ENCOUNTER — Telehealth: Payer: Self-pay | Admitting: Neurology

## 2017-11-09 NOTE — Telephone Encounter (Signed)
Error

## 2017-11-26 ENCOUNTER — Other Ambulatory Visit: Payer: Self-pay | Admitting: Internal Medicine

## 2017-11-26 DIAGNOSIS — K219 Gastro-esophageal reflux disease without esophagitis: Secondary | ICD-10-CM

## 2017-11-26 DIAGNOSIS — E039 Hypothyroidism, unspecified: Secondary | ICD-10-CM

## 2017-11-26 MED FILL — buPROPion HCL ER (XL) 150 M: 150 | 30 days supply | Qty: 30 | Fill #2

## 2017-11-26 MED FILL — metFORMIN HCL 1000 MG TABS: 1000 | 90 days supply | Qty: 180 | Fill #1

## 2017-11-26 MED FILL — DIVALPROEX SOD DR 500 MG TA: 500 | 37 days supply | Qty: 60 | Fill #1

## 2017-11-26 MED FILL — OMEPRAZOLE 40 MG CPDR: 40 | 30 days supply | Qty: 60 | Fill #1

## 2017-11-27 ENCOUNTER — Other Ambulatory Visit: Payer: Self-pay | Admitting: Internal Medicine

## 2017-12-12 MED FILL — ALPRAZolam 0.5 MG TABS: 0.5 | 23 days supply | Qty: 90 | Fill #3

## 2017-12-12 MED FILL — METHOCARBAMOL 500 MG TABLET: 500 | 15 days supply | Qty: 60 | Fill #0

## 2017-12-17 MED FILL — LEVOTHYROXINE 125 MCG TABLE: 125 | 90 days supply | Qty: 90 | Fill #0

## 2017-12-27 MED FILL — METHOCARBAMOL 500 MG TABLET: 500 | 15 days supply | Qty: 60 | Fill #0

## 2018-01-02 MED FILL — OMEPRAZOLE 40 MG CPDR: 40 | 30 days supply | Qty: 60 | Fill #0

## 2018-01-10 ENCOUNTER — Ambulatory Visit (INDEPENDENT_AMBULATORY_CARE_PROVIDER_SITE_OTHER): Payer: Medicare Other

## 2018-01-10 DIAGNOSIS — Z23 Encounter for immunization: Secondary | ICD-10-CM | POA: Diagnosis not present

## 2018-01-10 DIAGNOSIS — Z299 Encounter for prophylactic measures, unspecified: Secondary | ICD-10-CM

## 2018-01-15 ENCOUNTER — Ambulatory Visit: Payer: Self-pay

## 2018-01-15 ENCOUNTER — Ambulatory Visit (INDEPENDENT_AMBULATORY_CARE_PROVIDER_SITE_OTHER): Payer: Medicare Other | Admitting: Nurse Practitioner

## 2018-01-15 ENCOUNTER — Encounter: Payer: Self-pay | Admitting: Nurse Practitioner

## 2018-01-15 ENCOUNTER — Other Ambulatory Visit: Payer: Self-pay | Admitting: Internal Medicine

## 2018-01-15 ENCOUNTER — Ambulatory Visit (INDEPENDENT_AMBULATORY_CARE_PROVIDER_SITE_OTHER): Payer: Medicare Other

## 2018-01-15 ENCOUNTER — Telehealth: Payer: Self-pay | Admitting: Internal Medicine

## 2018-01-15 VITALS — BP 122/80 | HR 62 | Temp 98.2°F | Ht 67.0 in | Wt 172.0 lb

## 2018-01-15 DIAGNOSIS — F418 Other specified anxiety disorders: Secondary | ICD-10-CM

## 2018-01-15 DIAGNOSIS — M5410 Radiculopathy, site unspecified: Secondary | ICD-10-CM

## 2018-01-15 DIAGNOSIS — M79601 Pain in right arm: Secondary | ICD-10-CM

## 2018-01-15 DIAGNOSIS — M170 Bilateral primary osteoarthritis of knee: Secondary | ICD-10-CM

## 2018-01-15 DIAGNOSIS — E118 Type 2 diabetes mellitus with unspecified complications: Secondary | ICD-10-CM

## 2018-01-15 DIAGNOSIS — M4710 Other spondylosis with myelopathy, site unspecified: Secondary | ICD-10-CM | POA: Diagnosis not present

## 2018-01-15 DIAGNOSIS — E039 Hypothyroidism, unspecified: Secondary | ICD-10-CM

## 2018-01-15 MED ORDER — HYDROCODONE-ACETAMINOPHEN 5-325 MG PO TABS: 1.0000 | ORAL_TABLET | Freq: Four times a day (QID) | ORAL | 0 refills | Status: DC | PRN

## 2018-01-15 MED ORDER — BUPROPION HCL ER (XL) 150 MG PO TB24: 150.0000 mg | ORAL_TABLET | Freq: Every day | ORAL | 0 refills | Status: DC

## 2018-01-15 MED ORDER — ALPRAZOLAM 0.5 MG PO TABS: 0.5000 mg | ORAL_TABLET | Freq: Three times a day (TID) | ORAL | 0 refills | Status: DC | PRN

## 2018-01-15 MED FILL — buPROPion HCL ER (XL) 150 M: 150 | 30 days supply | Qty: 30 | Fill #0

## 2018-01-15 MED FILL — DIVALPROEX SOD DR 500 MG TA: 500 | 30 days supply | Qty: 60 | Fill #2

## 2018-01-15 MED FILL — ALPRAZolam 0.5 MG TABS: 0.5 | 30 days supply | Qty: 90 | Fill #0

## 2018-01-15 MED FILL — PROPRANOLOL 20 MG TABLET: 20 | 30 days supply | Qty: 60 | Fill #1

## 2018-01-15 MED FILL — METHOCARBAMOL 500 MG TABLET: 500 | 15 days supply | Qty: 60 | Fill #0

## 2018-01-15 NOTE — Progress Notes (Signed)
Subjective:  Patient ID: Tasha [name], female    DOB: 04/26/1960  Age: 57 y.o. MRN: [id number]  CC: Follow-up (pt stated that her right hand turn purple blue, felt like freezing,painful at times/ Dr. Ronnald Ramp just change her thyroid med.this happened on sunday)  Arm Pain   The incident occurred at home. There was no injury mechanism. The pain is present in the right forearm. The quality of the pain is described as aching (numbness of right forearm and chnage in skin color). The pain does not radiate. Associated symptoms include numbness and tingling. Pertinent negatives include no chest pain or muscle weakness. Nothing aggravates the symptoms. She has tried nothing for the symptoms.  onset while standing outside with winter jacket on. Symptoms resolved after 84mns. Symptoms did not extend to right hand or fingers. reports skin color was pale. Denies any palpitation or SOB or diaphoresis or chest pain. Denies any neck pain or injury.  Current daily tobacco use.  Reviewed past Medical, Social and Family history today.  Outpatient Medications Prior to Visit  Medication Sig Dispense Refill  . ALPRAZolam (XANAX) 0.5 MG tablet TAKE 1 TABLET BY MOUTH 4 TIMES DAILY AS NEEDED 90 tablet 3  . aspirin EC 81 MG tablet Take 1 tablet (81 mg total) by mouth daily. 90 tablet 3  . buPROPion (WELLBUTRIN XL) 150 MG 24 hr tablet Take 1 tablet (150 mg total) by mouth daily. 90 tablet 1  . Cholecalciferol (VITAMIN D3) 50000 units CAPS TAKE 1 CAPSULE BY MOUTH ONCE A WEEK 12 capsule 2  . divalproex (DEPAKOTE) 500 MG DR tablet 1 tablet daily for 2 weeks and then take 1 twice daily 60 tablet 2  . levothyroxine (SYNTHROID, LEVOTHROID) 125 MCG tablet TAKE 1 TABLET BY MOUTH ONCE DAILY 90 tablet 0  . NARCAN 4 MG/0.1ML LIQD nasal spray kit   2  . omeprazole (PRILOSEC) 40 MG capsule TAKE 2 CAPSULES BY MOUTH DAILY. 60 capsule 2  . Pancrelipase, Lip-Prot-Amyl, (ZENPEP) 40000-126000 units CPEP Take 1 capsule by mouth 3  (three) times daily with meals. 120 capsule 1  . sertraline (ZOLOFT) 100 MG tablet TAKE 2 TABLETS BY MOUTH DAILY. 180 tablet 0  . omeprazole (PRILOSEC) 40 MG capsule TAKE 2 CAPSULES BY MOUTH DAILY. (Patient not taking: Reported on 01/15/2018) 60 capsule 11   No facility-administered medications prior to visit.     ROS See HPI  Objective:  BP 122/80   Pulse 62   Temp 98.2 F (36.8 C) (Oral)   Ht 5' 7" (1.702 m)   Wt 172 lb (78 kg)   SpO2 96%   BMI 26.94 kg/m   BP Readings from Last 3 Encounters:  01/15/18 122/80  10/01/17 110/60  09/07/17 98/62    Wt Readings from Last 3 Encounters:  01/15/18 172 lb (78 kg)  10/01/17 170 lb (77.1 kg)  09/07/17 167 lb 8 oz (76 kg)    Physical Exam  Constitutional: She is oriented to person, place, and time.  Neck: Normal range of motion. Neck supple.  Cardiovascular: Normal rate.  Pulmonary/Chest: Effort normal.  Musculoskeletal: Normal range of motion. She exhibits no edema, tenderness or deformity.  Neurological: She is alert and oriented to person, place, and time. No sensory deficit.  Skin: Skin is warm and dry. No rash noted. No erythema. No pallor.  Psychiatric: She has a normal mood and affect. Her behavior is normal. Thought content normal.  Vitals reviewed.   Lab Results  Component Value Date  WBC 7.5 04/10/2016   HGB 14.7 04/10/2016   HCT 43.7 04/10/2016   PLT 175.0 04/10/2016   GLUCOSE 118 (H) 08/30/2017   CHOL 258 (H) 08/30/2017   TRIG 270.0 (H) 08/30/2017   HDL 36.90 (L) 08/30/2017   LDLDIRECT 176.0 08/30/2017   LDLCALC 149 (H) 04/10/2016   ALT 43 (H) 08/30/2017   AST 35 08/30/2017   NA 139 08/30/2017   K 3.9 08/30/2017   CL 105 08/30/2017   CREATININE 0.80 08/30/2017   BUN 12 08/30/2017   CO2 26 08/30/2017   TSH 2.39 08/30/2017   HGBA1C 7.1 (H) 08/30/2017   MICROALBUR <0.7 10/24/2016    Mr Brain Wo Contrast  Result Date: 10/04/2017 [facility] NEUROIMAGING REPORT STUDY DATE:  10/02/17 PATIENT NAME: Tasha [name] DOB: 03/13/1960 MRN: [id number] ORDERING CLINICIAN: Charles K Willis, MD CLINICAL HISTORY: 57 year old female with memory loss. EXAM: MRI brain (without) TECHNIQUE: MRI of the brain without contrast was obtained utilizing 5 mm axial slices with T1, T2, T2 flair, SWI and diffusion weighted views.  T1 sagittal and T2 coronal views were obtained. CONTRAST: no COMPARISON: none IMAGING SITE: [facility] [address] (1.5 Tesla MRI)  FINDINGS: No abnormal lesions are seen on diffusion-weighted views to suggest acute ischemia. The cortical sulci, fissures and cisterns are normal in size and appearance. Lateral, third and fourth ventricle are normal in size and appearance. No extra-axial fluid collections are seen. No evidence of mass effect or midline shift.  On sagittal views the posterior fossa, pituitary gland and corpus callosum are unremarkable. No evidence of intracranial hemorrhage on SWI views. The orbits and their contents, paranasal sinuses and calvarium are notable for mucosal thickening in the maxillary, sphenoid and ethmoid sinuses. Intracranial flow voids are present.   Unremarkable MRI brain (without). Mild chronic pansinusitis. No acute findings. INTERPRETING PHYSICIAN: VIKRAM R. PENUMALLI, MD Certified in Neurology, Neurophysiology and Neuroimaging [facility] [address], [address] [place], [place] [address] [phone]    Assessment & Plan:   Tasha was seen today for follow-up.  Diagnoses and all orders for this visit:  Arm pain, anterior, right -     DG Cervical Spine Complete -     Cancel: Ambulatory referral to Neurology  Radiculopathy affecting upper extremity -     DG Cervical Spine Complete -     Cancel: Ambulatory referral to Neurology   I am having Tasha [name] maintain her aspirin EC, sertraline, buPROPion, Vitamin D3, NARCAN, Pancrelipase (Lip-Prot-Amyl), divalproex, ALPRAZolam, omeprazole, omeprazole, and  levothyroxine.  No orders of the defined types were placed in this encounter.   Problem List Items Addressed This Visit    None    Visit Diagnoses    Arm pain, anterior, right    -  Primary   Relevant Orders   DG Cervical Spine Complete   Radiculopathy affecting upper extremity       Relevant Orders   DG Cervical Spine Complete       Follow-up: No follow-ups on file.   , NP 

## 2018-01-15 NOTE — Telephone Encounter (Signed)
Pt called to report that on Sunday while attending a birthday party her rt hand turned blue and numb.  She states she went to the car and turned on the heat and it took about 1 hour for the color to come back to her hand. She states her rt  foot felt real cold at the same time. Everything is normal now but her right hand still looks "funny". Not purple and has sensation.  This was the first time she has experienced this symptom. She has no cardiac or pulmonary symptoms.  She reports having diarrhea for months.  She averages 3 stools per day. Appointment scheduled per protocol with another office.  Care advice read to patient. Pt verbalized understanding of all instruction.  Reason for Disposition . [1] Tingling (e.g., pins and needles) of the face, arm / hand, or leg / foot on one side of the body AND [2] present now  Answer Assessment - Initial Assessment Questions 1. SYMPTOM: "What is the main symptom you are concerned about?" (e.g., weakness, numbness)     Cold extremity purple rt hand and feet were cold 2. ONSET: "When did this start?" (minutes, hours, days; while sleeping)     Sunday 3. LAST NORMAL: "When was the last time you were normal (no symptoms)?"     now 4. PATTERN "Does this come and go, or has it been constant since it started?"  "Is it present now?"    First time having symptom  Took 1 hour to get back 5. CARDIAC SYMPTOMS: "Have you had any of the following symptoms: chest pain, difficulty breathing, palpitations?"     no 6. NEUROLOGIC SYMPTOMS: "Have you had any of the following symptoms: headache, dizziness, vision loss, double vision, changes in speech, unsteady on your feet?"     no 7. OTHER SYMPTOMS: "Do you have any other symptoms?"     Diarrhea for months having 3 stools per day 8. PREGNANCY: "Is there any chance you are pregnant?" "When was your last menstrual period?"     No hysterectomy  Protocols used: NEUROLOGIC DEFICIT-A-AH

## 2018-01-15 NOTE — Telephone Encounter (Signed)
Pt has scheduled an appt and is requesting enough to get her to that appointment. Please advise.

## 2018-01-15 NOTE — Patient Instructions (Addendum)
No abnormal finding on cervical x-ray.  maintain appt with neurology 01/21/2018.

## 2018-01-15 NOTE — Telephone Encounter (Signed)
Appointment has been made for 12/12

## 2018-01-16 ENCOUNTER — Encounter: Payer: Self-pay | Admitting: Nurse Practitioner

## 2018-01-17 ENCOUNTER — Other Ambulatory Visit: Payer: Self-pay | Admitting: Internal Medicine

## 2018-01-17 ENCOUNTER — Encounter: Payer: Self-pay | Admitting: Internal Medicine

## 2018-01-17 ENCOUNTER — Ambulatory Visit (INDEPENDENT_AMBULATORY_CARE_PROVIDER_SITE_OTHER)
Admission: RE | Admit: 2018-01-17 | Discharge: 2018-01-17 | Disposition: A | Discharge status: Home / Self Care | Payer: Medicare Other | Source: Ambulatory Visit | Attending: Internal Medicine | Admitting: Internal Medicine

## 2018-01-17 ENCOUNTER — Other Ambulatory Visit (INDEPENDENT_AMBULATORY_CARE_PROVIDER_SITE_OTHER): Payer: Medicare Other

## 2018-01-17 ENCOUNTER — Telehealth: Payer: Self-pay | Admitting: Internal Medicine

## 2018-01-17 ENCOUNTER — Ambulatory Visit (INDEPENDENT_AMBULATORY_CARE_PROVIDER_SITE_OTHER): Payer: Medicare Other | Admitting: Internal Medicine

## 2018-01-17 VITALS — BP 118/70 | HR 67 | Temp 97.8°F | Ht 67.0 in | Wt 170.0 lb

## 2018-01-17 DIAGNOSIS — E785 Hyperlipidemia, unspecified: Secondary | ICD-10-CM

## 2018-01-17 DIAGNOSIS — G8929 Other chronic pain: Secondary | ICD-10-CM

## 2018-01-17 DIAGNOSIS — K219 Gastro-esophageal reflux disease without esophagitis: Secondary | ICD-10-CM

## 2018-01-17 DIAGNOSIS — M170 Bilateral primary osteoarthritis of knee: Secondary | ICD-10-CM

## 2018-01-17 DIAGNOSIS — K760 Fatty (change of) liver, not elsewhere classified: Secondary | ICD-10-CM | POA: Diagnosis not present

## 2018-01-17 DIAGNOSIS — E118 Type 2 diabetes mellitus with unspecified complications: Secondary | ICD-10-CM | POA: Diagnosis not present

## 2018-01-17 DIAGNOSIS — F418 Other specified anxiety disorders: Secondary | ICD-10-CM | POA: Diagnosis not present

## 2018-01-17 DIAGNOSIS — E538 Deficiency of other specified B group vitamins: Secondary | ICD-10-CM

## 2018-01-17 DIAGNOSIS — R109 Unspecified abdominal pain: Secondary | ICD-10-CM

## 2018-01-17 DIAGNOSIS — Z79891 Long term (current) use of opiate analgesic: Secondary | ICD-10-CM | POA: Diagnosis not present

## 2018-01-17 DIAGNOSIS — E559 Vitamin D deficiency, unspecified: Secondary | ICD-10-CM

## 2018-01-17 DIAGNOSIS — E039 Hypothyroidism, unspecified: Secondary | ICD-10-CM

## 2018-01-17 LAB — CBC WITH DIFFERENTIAL/PLATELET
Basophils Absolute: 0.1 10*3/uL (ref 0.0–0.1)
Basophils Relative: 0.7 % (ref 0.0–3.0)
Eosinophils Absolute: 0 10*3/uL (ref 0.0–0.7)
Eosinophils Relative: 0.1 % (ref 0.0–5.0)
HCT: 43 % (ref 36.0–46.0)
Hemoglobin: 14.5 g/dL (ref 12.0–15.0)
Lymphocytes Relative: 40.7 % (ref 12.0–46.0)
Lymphs Abs: 3.4 10*3/uL (ref 0.7–4.0)
MCHC: 33.6 g/dL (ref 30.0–36.0)
MCV: 94.7 fl (ref 78.0–100.0)
Monocytes Absolute: 0.3 10*3/uL (ref 0.1–1.0)
Monocytes Relative: 4.1 % (ref 3.0–12.0)
Neutro Abs: 4.5 10*3/uL (ref 1.4–7.7)
Neutrophils Relative %: 54.4 % (ref 43.0–77.0)
Platelets: 181 10*3/uL (ref 150.0–400.0)
RBC: 4.54 Mil/uL (ref 3.87–5.11)
RDW: 13.8 % (ref 11.5–15.5)
WBC: 8.3 10*3/uL (ref 4.0–10.5)

## 2018-01-17 LAB — COMPREHENSIVE METABOLIC PANEL
ALT: 36 U/L — ABNORMAL HIGH (ref 0–35)
AST: 32 U/L (ref 0–37)
Albumin: 4.7 g/dL (ref 3.5–5.2)
Alkaline Phosphatase: 69 U/L (ref 39–117)
BUN: 12 mg/dL (ref 6–23)
CO2: 26 mEq/L (ref 19–32)
Calcium: 9.7 mg/dL (ref 8.4–10.5)
Chloride: 102 mEq/L (ref 96–112)
Creatinine, Ser: 0.86 mg/dL (ref 0.40–1.20)
GFR: 71.83 mL/min (ref >60.00)
Glucose, Bld: 110 mg/dL — ABNORMAL HIGH (ref 70–99)
Potassium: 4 mEq/L (ref 3.5–5.1)
Sodium: 136 mEq/L (ref 135–145)
Total Bilirubin: 0.5 mg/dL (ref 0.2–1.2)
Total Protein: 7.6 g/dL (ref 6.0–8.3)

## 2018-01-17 LAB — URINALYSIS, ROUTINE W REFLEX MICROSCOPIC
Bilirubin Urine: NEGATIVE
Hgb urine dipstick: NEGATIVE
Ketones, ur: NEGATIVE
Leukocytes, UA: NEGATIVE
Nitrite: NEGATIVE
RBC / HPF: NONE SEEN (ref 0–?)
Specific Gravity, Urine: 1.025 (ref 1.000–1.030)
Total Protein, Urine: NEGATIVE
Urine Glucose: NEGATIVE
Urobilinogen, UA: 0.2 (ref 0.0–1.0)
pH: 5.5 (ref 5.0–8.0)

## 2018-01-17 LAB — LDL CHOLESTEROL, DIRECT: Direct LDL: 214 mg/dL

## 2018-01-17 LAB — LIPID PANEL
Cholesterol: 284 mg/dL — ABNORMAL HIGH (ref 0–200)
HDL: 42.3 mg/dL (ref >39.00)
NonHDL: 241.25
Total CHOL/HDL Ratio: 7
Triglycerides: 219 mg/dL — ABNORMAL HIGH (ref 0.0–149.0)
VLDL: 43.8 mg/dL — ABNORMAL HIGH (ref 0.0–40.0)

## 2018-01-17 LAB — MICROALBUMIN / CREATININE URINE RATIO
Creatinine,U: 139.8 mg/dL
Microalb Creat Ratio: 0.6 mg/g (ref 0.0–30.0)
Microalb, Ur: 0.9 mg/dL (ref 0.0–1.9)

## 2018-01-17 LAB — TSH: TSH: 5.87 u[IU]/mL — ABNORMAL HIGH (ref 0.35–4.50)

## 2018-01-17 LAB — HEMOGLOBIN A1C: Hgb A1c MFr Bld: 6.4 % (ref 4.6–6.5)

## 2018-01-17 MED ORDER — SERTRALINE HCL 100 MG PO TABS: 200.0000 mg | ORAL_TABLET | Freq: Every day | ORAL | 0 refills | Status: DC

## 2018-01-17 MED ORDER — BUPROPION HCL ER (XL) 150 MG PO TB24: 150.0000 mg | ORAL_TABLET | Freq: Every day | ORAL | 1 refills | Status: DC

## 2018-01-17 MED ORDER — ALPRAZOLAM 0.5 MG PO TABS: 0.5000 mg | ORAL_TABLET | Freq: Three times a day (TID) | ORAL | 0 refills | Status: DC | PRN

## 2018-01-17 MED ORDER — CYANOCOBALAMIN 1000 MCG/ML IJ SOLN: 1000.0000 ug | Freq: Once | INTRAMUSCULAR | Status: DC

## 2018-01-17 MED ORDER — OMEPRAZOLE 40 MG PO CPDR: 80.0000 mg | DELAYED_RELEASE_CAPSULE | Freq: Every day | ORAL | 1 refills | Status: DC

## 2018-01-17 MED ORDER — LEVOTHYROXINE SODIUM 137 MCG PO TABS: 137.0000 ug | ORAL_TABLET | Freq: Every day | ORAL | 0 refills | Status: DC

## 2018-01-17 MED ORDER — LEVOTHYROXINE SODIUM 125 MCG PO TABS: 125.0000 ug | ORAL_TABLET | Freq: Every day | ORAL | 1 refills | Status: DC

## 2018-01-17 MED ORDER — VITAMIN D3 1.25 MG (50000 UT) PO CAPS: 1.0000 | ORAL_CAPSULE | ORAL | 0 refills | Status: DC

## 2018-01-17 MED FILL — HYDROCODON-APAP 5-325: 5-325 | 19 days supply | Qty: 75 | Fill #0

## 2018-01-17 NOTE — Telephone Encounter (Signed)
Spoke to patient.. this has been handled.

## 2018-01-17 NOTE — Progress Notes (Signed)
 Subjective:  Patient ID: Tasha [name], female    DOB: 01/13/1961  Age: 57 y.o. MRN: [id number]  CC: Hypertension; Hyperlipidemia; and Hypothyroidism   HPI Tasha [name] presents for f/up - She complains of intermittent right flank pain for over a year.  She also complains of chronic pain and anxiety.  For the pain and anxiety she is getting adequate symptom relief with hydrocodone, acetaminophen, and Xanax as needed.  Outpatient Medications Prior to Visit  Medication Sig Dispense Refill  . aspirin EC 81 MG tablet Take 1 tablet (81 mg total) by mouth daily. 90 tablet 3  . divalproex (DEPAKOTE) 500 MG DR tablet 1 tablet daily for 2 weeks and then take 1 twice daily 60 tablet 2  . NARCAN 4 MG/0.1ML LIQD nasal spray kit   2  . Pancrelipase, Lip-Prot-Amyl, (ZENPEP) 40000-126000 units CPEP Take 1 capsule by mouth 3 (three) times daily with meals. 120 capsule 1  . ALPRAZolam (XANAX) 0.5 MG tablet Take 1 tablet (0.5 mg total) by mouth 3 (three) times daily as needed for anxiety. 90 tablet 0  . buPROPion (WELLBUTRIN XL) 150 MG 24 hr tablet Take 1 tablet (150 mg total) by mouth daily. 30 tablet 0  . Cholecalciferol (VITAMIN D3) 50000 units CAPS TAKE 1 CAPSULE BY MOUTH ONCE A WEEK 12 capsule 2  . HYDROcodone-acetaminophen (NORCO/VICODIN) 5-325 MG tablet Take 1 tablet by mouth every 6 (six) hours as needed. Not to exceed 4 tablets a day 90 tablet 0  . levothyroxine (SYNTHROID, LEVOTHROID) 125 MCG tablet TAKE 1 TABLET BY MOUTH ONCE DAILY 90 tablet 0  . omeprazole (PRILOSEC) 40 MG capsule TAKE 2 CAPSULES BY MOUTH DAILY. 60 capsule 2  . sertraline (ZOLOFT) 100 MG tablet TAKE 2 TABLETS BY MOUTH DAILY. 180 tablet 0   No facility-administered medications prior to visit.     ROS Review of Systems  Constitutional: Negative.  Negative for appetite change, chills, diaphoresis, fatigue and fever.  HENT: Negative.   Respiratory: Negative for cough, chest tightness, shortness of breath and wheezing.     Cardiovascular: Negative for chest pain, palpitations and leg swelling.  Gastrointestinal: Negative for abdominal pain, constipation, diarrhea, nausea and vomiting.  Endocrine: Negative.   Genitourinary: Positive for flank pain. Negative for decreased urine volume, difficulty urinating, dysuria, hematuria, menstrual problem, pelvic pain, urgency, vaginal bleeding, vaginal discharge and vaginal pain.  Musculoskeletal: Positive for arthralgias. Negative for back pain and myalgias.  Skin: Negative.  Negative for color change, pallor and rash.  Neurological: Negative.  Negative for dizziness, weakness, light-headedness and headaches.  Hematological: Negative for adenopathy. Does not bruise/bleed easily.  Psychiatric/Behavioral: Positive for dysphoric mood. Negative for behavioral problems, confusion, decreased concentration, hallucinations, self-injury, sleep disturbance and suicidal ideas. The patient is nervous/anxious. The patient is not hyperactive.     Objective:  BP 118/70 (BP Location: Left Arm, Patient Position: Sitting, Cuff Size: Large)   Pulse 67   Temp 97.8 F (36.6 C) (Oral)   Ht 5' 7" (1.702 m)   Wt 170 lb (77.1 kg)   SpO2 97%   BMI 26.63 kg/m   BP Readings from Last 3 Encounters:  01/17/18 118/70  01/15/18 122/80  10/01/17 110/60    Wt Readings from Last 3 Encounters:  01/17/18 170 lb (77.1 kg)  01/15/18 172 lb (78 kg)  10/01/17 170 lb (77.1 kg)    Physical Exam Vitals signs reviewed.  Constitutional:      General: She is not in acute distress.      Appearance: Normal appearance. She is not ill-appearing, toxic-appearing or diaphoretic.  HENT:     Nose: Nose normal.     Mouth/Throat:     Mouth: Mucous membranes are moist.     Pharynx: No posterior oropharyngeal erythema.  Neck:     Musculoskeletal: Normal range of motion and neck supple.  Cardiovascular:     Rate and Rhythm: Normal rate and regular rhythm.     Pulses: Normal pulses.     Heart sounds: No  murmur. No gallop.   Pulmonary:     Effort: Pulmonary effort is normal.     Breath sounds: Normal breath sounds. No wheezing, rhonchi or rales.  Chest:     Chest wall: No mass, deformity, swelling, tenderness, crepitus or edema.  Abdominal:     General: Abdomen is flat. Bowel sounds are normal.     Palpations: Abdomen is soft. There is no hepatomegaly, splenomegaly or mass.     Tenderness: There is no abdominal tenderness. There is no right CVA tenderness, left CVA tenderness or guarding.  Musculoskeletal: Normal range of motion.        General: No swelling, tenderness or signs of injury.  Skin:    General: Skin is warm and dry.     Coloration: Skin is not pale.     Findings: No rash.  Neurological:     General: No focal deficit present.     Mental Status: She is alert and oriented to person, place, and time.  Psychiatric:        Attention and Perception: Attention and perception normal.        Mood and Affect: Mood normal.        Speech: Speech normal.        Behavior: Behavior normal. Behavior is not agitated, slowed or withdrawn. Behavior is cooperative.        Thought Content: Thought content normal. Thought content is not paranoid. Thought content does not include homicidal or suicidal ideation.        Cognition and Memory: Cognition normal.        Judgment: Judgment normal.     Lab Results  Component Value Date   WBC 8.3 01/17/2018   HGB 14.5 01/17/2018   HCT 43.0 01/17/2018   PLT 181.0 01/17/2018   GLUCOSE 110 (H) 01/17/2018   CHOL 284 (H) 01/17/2018   TRIG 219.0 (H) 01/17/2018   HDL 42.30 01/17/2018   LDLDIRECT 214.0 01/17/2018   LDLCALC 149 (H) 04/10/2016   ALT 36 (H) 01/17/2018   AST 32 01/17/2018   NA 136 01/17/2018   K 4.0 01/17/2018   CL 102 01/17/2018   CREATININE 0.86 01/17/2018   BUN 12 01/17/2018   CO2 26 01/17/2018   TSH 5.87 (H) 01/17/2018   HGBA1C 6.4 01/17/2018   MICROALBUR 0.9 01/17/2018    Mr Brain Wo Contrast  Result Date:  10/04/2017 [facility] NEUROIMAGING REPORT STUDY DATE: 10/02/17 PATIENT NAME: Tasha [name] DOB: 09/14/1960 MRN: [id number] ORDERING CLINICIAN: Charles K Willis, MD CLINICAL HISTORY: 57 year old female with memory loss. EXAM: MRI brain (without) TECHNIQUE: MRI of the brain without contrast was obtained utilizing 5 mm axial slices with T1, T2, T2 flair, SWI and diffusion weighted views.  T1 sagittal and T2 coronal views were obtained. CONTRAST: no COMPARISON: none IMAGING SITE: [facility] [address] (1.5 Tesla MRI)  FINDINGS: No abnormal lesions are seen on diffusion-weighted views to suggest acute ischemia. The cortical sulci, fissures and cisterns are normal   in size and appearance. Lateral, third and fourth ventricle are normal in size and appearance. No extra-axial fluid collections are seen. No evidence of mass effect or midline shift.  On sagittal views the posterior fossa, pituitary gland and corpus callosum are unremarkable. No evidence of intracranial hemorrhage on SWI views. The orbits and their contents, paranasal sinuses and calvarium are notable for mucosal thickening in the maxillary, sphenoid and ethmoid sinuses. Intracranial flow voids are present.   Unremarkable MRI brain (without). Mild chronic pansinusitis. No acute findings. INTERPRETING PHYSICIAN: VIKRAM R. PENUMALLI, MD Certified in Neurology, Neurophysiology and Neuroimaging [facility] [address], [address] [place], [place] [address] [phone]   Dg Abd Acute W/chest  Result Date: 01/17/2018 CLINICAL DATA:  Right flank pain for 1 year EXAM: DG ABDOMEN ACUTE W/ 1V CHEST COMPARISON:  10/24/2016 chest x-ray FINDINGS: Single-view chest demonstrates no acute opacity or pleural effusion. Normal heart size. No pneumothorax. Mild scoliosis of the spine. Supine and upright views of the abdomen demonstrate no free air beneath the diaphragm. Nonobstructed bowel-gas pattern with mild stool. No  radiopaque calculi. IMPRESSION: 1. No radiographic evidence for acute cardiopulmonary abnormality. 2. Nonobstructed bowel-gas pattern Electronically Signed   By: Tasha  Fujinaga M.D.   On: 01/17/2018 15:34     Assessment & Plan:   Tasha was seen today for hypertension, hyperlipidemia and hypothyroidism.  Diagnoses and all orders for this visit:  Acquired hypothyroidism- Her TSH is elevated at 5.87.  I have asked her to increase her levothyroxine dose to 137 mcg a day. -     TSH; Future -     Discontinue: levothyroxine (SYNTHROID, LEVOTHROID) 125 MCG tablet; Take 1 tablet (125 mcg total) by mouth daily. -     levothyroxine (SYNTHROID, LEVOTHROID) 137 MCG tablet; Take 1 tablet (137 mcg total) by mouth daily before breakfast.  Fatty liver- Her ALT and AST remain slightly elevated.  She was encouraged to improve her lifestyle modifications. -     Comprehensive metabolic panel; Future -     CBC with Differential/Platelet; Future  Type II diabetes mellitus with manifestations (HCC)- Her A1c is at 6.4%.  Her blood sugars are adequately well controlled. -     Urinalysis, Routine w reflex microscopic; Future -     Microalbumin / creatinine urine ratio; Future -     Hemoglobin A1c; Future  Hyperlipidemia with target LDL less than 130- She has not achieved her LDL goal and is not willing to take a statin. -     Lipid panel; Future  Encounter for long-term opiate analgesic use -     Pain Mgmt, Profile 8 w/Conf, U; Future  Chronic right flank pain- Her exam, plain films, and labs are very reassuring.  This is most likely musculoskeletal in nature.  She will continue the current meds as needed. -     DG Abd Acute W/Chest; Future  Depression with anxiety -     ALPRAZolam (XANAX) 0.5 MG tablet; Take 1 tablet (0.5 mg total) by mouth 3 (three) times daily as needed for anxiety. -     sertraline (ZOLOFT) 100 MG tablet; Take 2 tablets (200 mg total) by mouth daily. -     buPROPion (WELLBUTRIN XL) 150 MG  24 hr tablet; Take 1 tablet (150 mg total) by mouth daily.  Gastroesophageal reflux disease, esophagitis presence not specified -     omeprazole (PRILOSEC) 40 MG capsule; Take 2 capsules (80 mg total) by mouth daily.  Vitamin D deficiency -       Cholecalciferol (VITAMIN D3) 1.25 MG (50000 UT) CAPS; Take 1 capsule by mouth once a week.  B12 deficiency -     cyanocobalamin ((VITAMIN B-12)) injection 1,000 mcg   I have discontinued Tasha D. [name]'s levothyroxine and levothyroxine. I have also changed her Vitamin D3, sertraline, and omeprazole. Additionally, I am having her start on levothyroxine. Lastly, I am having her maintain her aspirin EC, NARCAN, Pancrelipase (Lip-Prot-Amyl), divalproex, ALPRAZolam, and buPROPion. We will continue to administer cyanocobalamin.  Meds ordered this encounter  Medications  . cyanocobalamin ((VITAMIN B-12)) injection 1,000 mcg  . ALPRAZolam (XANAX) 0.5 MG tablet    Sig: Take 1 tablet (0.5 mg total) by mouth 3 (three) times daily as needed for anxiety.    Dispense:  90 tablet    Refill:  0  . Cholecalciferol (VITAMIN D3) 1.25 MG (50000 UT) CAPS    Sig: Take 1 capsule by mouth once a week.    Dispense:  12 capsule    Refill:  0  . DISCONTD: levothyroxine (SYNTHROID, LEVOTHROID) 125 MCG tablet    Sig: Take 1 tablet (125 mcg total) by mouth daily.    Dispense:  90 tablet    Refill:  1  . sertraline (ZOLOFT) 100 MG tablet    Sig: Take 2 tablets (200 mg total) by mouth daily.    Dispense:  180 tablet    Refill:  0  . omeprazole (PRILOSEC) 40 MG capsule    Sig: Take 2 capsules (80 mg total) by mouth daily.    Dispense:  180 capsule    Refill:  1  . buPROPion (WELLBUTRIN XL) 150 MG 24 hr tablet    Sig: Take 1 tablet (150 mg total) by mouth daily.    Dispense:  90 tablet    Refill:  1  . levothyroxine (SYNTHROID, LEVOTHROID) 137 MCG tablet    Sig: Take 1 tablet (137 mcg total) by mouth daily before breakfast.    Dispense:  90 tablet    Refill:  0      Follow-up: Return in about 4 months (around 05/19/2018).   , MD 

## 2018-01-17 NOTE — Telephone Encounter (Signed)
Copied from [place] [id number]. Topic: General - Other >> Jan 17, 2018 11:43 AM Lennox Solders wrote: Reason for CRM: [facility] is calling and hydrocodone they never received  for this patient. Please resend

## 2018-01-17 NOTE — Patient Instructions (Signed)
Flank Pain, Adult Flank pain is pain that is located on the side of the body between the upper abdomen and the back. This area is called the flank. The pain may occur over a short period of time (acute), or it may be long-term or recurring (chronic). It may be mild or severe. Flank pain can be caused by many things, including:  Muscle soreness or injury.  Kidney stones or kidney disease.  Stress.  A disease of the spine (vertebral disk disease).  A lung infection (pneumonia).  Fluid around the lungs (pulmonary edema).  A skin rash caused by the chickenpox virus (shingles).  Tumors that affect the back of the abdomen.  Gallbladder disease.  Follow these instructions at home:  Drink enough fluid to keep your urine clear or pale yellow.  Rest as told by your health care provider.  Take over-the-counter and prescription medicines only as told by your health care provider.  Keep a journal to track what has caused your flank pain and what has made it feel better.  Keep all follow-up visits as told by your health care provider. This is important. Contact a health care provider if:  Your pain is not controlled with medicine.  You have new symptoms.  Your pain gets worse.  You have a fever.  Your symptoms last longer than 2-3 days.  You have trouble urinating or you are urinating very frequently. Get help right away if:  You have trouble breathing or you are short of breath.  Your abdomen hurts or it is swollen or red.  You have nausea or vomiting.  You feel faint or you pass out.  You have blood in your urine. Summary  Flank pain is pain that is located on the side of the body between the upper abdomen and the back.  The pain may occur over a short period of time (acute), or it may be long-term or recurring (chronic). It may be mild or severe.  Flank pain can be caused by many things.  Contact your health care provider if your symptoms get worse or they last  longer than 2-3 days. This information is not intended to replace advice given to you by your health care provider. Make sure you discuss any questions you have with your health care provider. Document Released: 03/16/2005 Document Revised: 04/07/2016 Document Reviewed: 04/07/2016 Elsevier Interactive Patient Education  2018 Elsevier Inc.  

## 2018-01-19 LAB — PAIN MGMT, PROFILE 8 W/CONF, U
6 Acetylmorphine: NEGATIVE ng/mL (ref <10)
Alcohol Metabolites: NEGATIVE ng/mL (ref <500)
Alphahydroxyalprazolam: 176 ng/mL — ABNORMAL HIGH (ref <25)
Alphahydroxymidazolam: NEGATIVE ng/mL (ref <50)
Alphahydroxytriazolam: NEGATIVE ng/mL (ref <50)
Aminoclonazepam: NEGATIVE ng/mL (ref <25)
Amphetamines: NEGATIVE ng/mL (ref <500)
Benzodiazepines: POSITIVE ng/mL — AB (ref <100)
Buprenorphine, Urine: NEGATIVE ng/mL (ref <5)
Cocaine Metabolite: NEGATIVE ng/mL (ref <150)
Creatinine: 137.4 mg/dL
Hydroxyethylflurazepam: NEGATIVE ng/mL (ref <50)
Lorazepam: NEGATIVE ng/mL (ref <50)
MDMA: NEGATIVE ng/mL (ref <500)
Marijuana Metabolite: NEGATIVE ng/mL (ref <20)
Nordiazepam: NEGATIVE ng/mL (ref <50)
Opiates: NEGATIVE ng/mL (ref <100)
Oxazepam: NEGATIVE ng/mL (ref <50)
Oxidant: NEGATIVE ug/mL (ref <200)
Oxycodone: NEGATIVE ng/mL (ref <100)
Temazepam: NEGATIVE ng/mL (ref <50)
pH: 6.25 (ref 4.5–9.0)

## 2018-01-21 ENCOUNTER — Ambulatory Visit (INDEPENDENT_AMBULATORY_CARE_PROVIDER_SITE_OTHER): Payer: Medicare Other | Admitting: Adult Health

## 2018-01-21 ENCOUNTER — Encounter: Payer: Self-pay | Admitting: Adult Health

## 2018-01-21 VITALS — BP 104/59 | HR 65 | Ht 67.0 in | Wt 170.4 lb

## 2018-01-21 DIAGNOSIS — G43001 Migraine without aura, not intractable, with status migrainosus: Secondary | ICD-10-CM

## 2018-01-21 MED ORDER — DIVALPROEX SODIUM 500 MG PO DR TAB: 500.0000 mg | DELAYED_RELEASE_TABLET | Freq: Two times a day (BID) | ORAL | 4 refills | Status: DC

## 2018-01-21 NOTE — Patient Instructions (Addendum)
Your Plan:  Continue current dose of Depakote 599m twice a day for migraine management  Decrease stress as this can also help decrease migraines and visual episodes     Follow up in 6 months or call earlier if needed       [facility] you for [facility]. I hope we have been able to provide you high quality care today.  You may receive a patient satisfaction survey over the next few weeks. We would appreciate your feedback and comments so that we may continue to improve ourselves and the health of our patients.

## 2018-01-21 NOTE — Progress Notes (Signed)
Reason for visit: Memory disturbance, visual changes  Referring physician: Dr. Wanita Chamberlain is a 57 y.o. female  History of present illness: 09/07/2017 visit Dr. Jannifer Franklin: [name] is a 57 year old right-handed white female with a history of anxiety and depression.  The patient comes into the office today indicating a 10-year history of some troubles with memory.  The patient believes that her memory issues have gotten slightly worse over time.  She has some trouble with short-term memory, difficulty with names and some word finding problems.  She is able to operate a motor vehicle without difficulty.  She is able to manage her medications and appointments.  She will go into rooms and cannot remember why she went there.  She has some intermittent numbness of the arms that may come and go, occasionally affecting the legs and feet as well.  The patient at times feels weak all over, she has some mild balance issues with occasional falls.  She denies issues controlling the bladder, she did have a recent episode of diarrhea associated with a medication side effect.  She also over the last 2 to 3 weeks has had episodes of visual dimming or loss that may occur with increased stress.  The patient claims that her husband retired a year ago and she has had increased stress associated with this.  The patient will feel anxious and nervous, if she takes Xanax this will help the visual episode to resolve quickly.  The patient is also now getting headaches about every other day, often times associated with the episodes of visual disturbance.  The visual disturbance may last up to 20 to 30 minutes.  The patient claims that she had very similar events spanning a 10-year timeframe beginning in 1992.  The patient has had recent recurrence of similar symptoms.  The patient does have a history of migraine headache.  The patient comes to this office for an evaluation.  Interval history 01/21/2018: Patient is being  seen today for follow-up visit.  She did have MRI brain which was reviewed and unremarkable for acute findings or abnormalities.  Per telephone notes, it does appears she was unable to tolerate propranolol and it was recommended to discontinue this and to initiate Depakote. She does state that she continues to have episodes of visual loss which can last for "a while" and occurs more when she is stressed out. She does state improvement of her migraines since starting the Depakote 548m twice daily.  She does endorse increased stress recently as her husbands cousin is currently living with them who was previously homeless and addicted to recreational drugs. She does state that he has recently went to rehab and has been doing better and possibly will be moving out soon. Her father passed away at the end of A05-Sep-2024  No further concerns at this time.    Past Medical History:  Diagnosis Date  . Anxiety state, unspecified   . Arthralgia of temporomandibular joint   . Conversion disorder   . Dementia ([place]   . Depressive disorder, not elsewhere classified   . Dysphagia, unspecified(787.20)   . Endometriosis   . Esophageal reflux   . Fibromyalgia   . Headache(784.0)   . Irritable bowel syndrome   . Mitral valve disorders(424.0)   . Myalgia and myositis, unspecified   . Overweight(278.02)   . Periodic limb movement disorder   . Pure hypercholesterolemia   . Unspecified hypothyroidism     Past Surgical History:  Procedure  Laterality Date  . ABDOMINAL HYSTERECTOMY     TAH  . arm surgery    . FOOT SURGERY    . LIPOMA EXCISION     BESIDES BELLY BUTTON  . TONSILLECTOMY    . WRIST SURGERY      Family History  Problem Relation Age of Onset  . Heart disease Mother   . Heart disease Father   . Diabetes Father   . Breast cancer Maternal Aunt   . Colon cancer Paternal Aunt     Social history:  reports that she has been smoking cigarettes. She has been smoking about 1.00 pack per day. She has  never used smokeless tobacco. She reports current alcohol use of about 1.0 standard drinks of alcohol per week. She reports that she does not use drugs.  Medications:  Prior to Admission medications   Medication Sig Start Date End Date Taking? Authorizing Provider  ALPRAZolam Duanne Moron) 0.5 MG tablet TAKE 1 TABLET BY MOUTH 4 TIMES DAILY AS NEEDED 08/16/17  Yes Janith Lima, MD  aspirin EC 81 MG tablet Take 1 tablet (81 mg total) by mouth daily. 04/10/16  Yes Janith Lima, MD  buPROPion (WELLBUTRIN XL) 150 MG 24 hr tablet Take 1 tablet (150 mg total) by mouth daily. 03/19/17  Yes Janith Lima, MD  Cholecalciferol (VITAMIN D3) 50000 units CAPS TAKE 1 CAPSULE BY MOUTH ONCE A WEEK 05/21/17  Yes Janith Lima, MD  levothyroxine (SYNTHROID, LEVOTHROID) 125 MCG tablet Take 1 tablet (125 mcg total) by mouth daily. 08/16/17  Yes Janith Lima, MD  NARCAN 4 MG/0.1ML LIQD nasal spray kit  07/16/17  Yes [provider]  omeprazole (PRILOSEC) 40 MG capsule Take 2 capsules (80 mg total) by mouth daily. 10/17/16  Yes Janith Lima, MD  Pancrelipase, Lip-Prot-Amyl, (ZENPEP) 40000-126000 units CPEP Take 1 capsule by mouth 3 (three) times daily with meals. 08/30/17  Yes Janith Lima, MD  sertraline (ZOLOFT) 100 MG tablet TAKE 2 TABLETS BY MOUTH DAILY. 03/15/17  Yes Janith Lima, MD  propranolol (INDERAL) 20 MG tablet Take 1 tablet (20 mg total) by mouth 2 (two) times daily. 09/07/17   Kathrynn Ducking, MD      Allergies  Allergen Reactions  . Wilder Glade [Dapagliflozin] Other (See Comments)    Vag yeast inf  . Statins Other (See Comments)    Muscle weakness/pains  . Crestor [Rosuvastatin] Other (See Comments)    Muscle aches  . Lipitor [Atorvastatin] Other (See Comments)    Muscle aches  . Zocor [Simvastatin] Other (See Comments)    Leg pain per patient/PE    ROS:  Out of a complete 14 system review of symptoms, the patient complains only of the following symptoms, and all other reviewed  systems are negative.  Chills, fatigue, excessive sweating, ringing in ears, trouble swallowing, light sensitivity, double vision, blurred vision, leg swelling, palpitations, excessive thirst, swollen abdomen, abdominal pain, black stools, diarrhea, restless leg, snoring, environmental allergies, joint pain, joint swelling, back pain, aching muscles, muscle cramps, walking difficulty, neck pain, memory loss, dizziness, headache, numbness, behavior problem and depression   Blood pressure (!) 104/59, pulse 65, height 5' 7"  (1.702 m), weight 170 lb 6.4 oz (77.3 kg).  Physical Exam  General: The patient is alert and cooperative at the time of the examination.  Eyes: Pupils are equal, round, and reactive to light. Discs are flat bilaterally.  Neck: The neck is supple, no carotid bruits are noted.  Respiratory: The respiratory  examination is clear.  Cardiovascular: The cardiovascular examination reveals a regular rate and rhythm, no obvious murmurs or rubs are noted.  Skin: Extremities are without significant edema.  Neurologic Exam  Mental status: The patient is alert and oriented x 3 at the time of the examination. The patient has apparent normal recent and remote memory, with an apparently normal attention span and concentration ability.    Cranial nerves: Facial symmetry is present. There is good sensation of the face to pinprick and soft touch bilaterally. The strength of the facial muscles and the muscles to head turning and shoulder shrug are normal bilaterally. Speech is well enunciated, no aphasia or dysarthria is noted. Extraocular movements are full. Visual fields are full. The tongue is midline, and the patient has symmetric elevation of the soft palate. No obvious hearing deficits are noted.  Motor: The motor testing reveals 5 over 5 strength of all 4 extremities. Good symmetric motor tone is noted throughout.  Sensory: Sensory testing is intact to pinprick, soft touch, vibration  sensation, and position sense on all 4 extremities, with exception some decreased vibration sensation on the right foot, decreased pinprick sensation on the left foot. No evidence of extinction is noted.  Coordination: Cerebellar testing reveals good finger-nose-finger and heel-to-shin bilaterally.  Gait and station: Gait is normal. Tandem gait is normal. Romberg is negative. No drift is seen.  Reflexes: Deep tendon reflexes are symmetric and normal bilaterally. Toes are downgoing bilaterally.   Assessment/Plan:  1.  History of anxiety and depression  2.  Reported memory disturbance  3.  Episodic headaches and visual disturbances  4.  History of migraine  This patient's headaches have been improving, will continue Depakote 500 mg twice daily.  Spoke to patient regarding stress reduction management which will help decrease migraines and visual disturbances rinses as these are both typically increased by stress/anxiety.  Advised patient to follow-up in 6 months time or call office if needed with questions, concerns or need a sooner follow-up appointment.   Venancio Poisson, AGNP-BC  [facility] [address] [place], [place] [address]  Phone [phone] Fax [phone] Note: This document was prepared with digital dictation and possible smart phrase technology. Any transcriptional errors that result from this process are unintentional.

## 2018-01-22 NOTE — Progress Notes (Signed)
I agree with the above plan 

## 2018-01-23 ENCOUNTER — Other Ambulatory Visit: Payer: Self-pay | Admitting: Internal Medicine

## 2018-01-31 MED FILL — VITAMIN D3 50000 UNIT CAPS: 1.25 MG | 84 days supply | Qty: 12 | Fill #0

## 2018-01-31 MED FILL — LEVOTHYROXINE 137 MCG TABLE: 137 | 90 days supply | Qty: 90 | Fill #0

## 2018-01-31 MED FILL — SERTRALINE HCL 100 MG TAB: 100 | 90 days supply | Qty: 180 | Fill #0

## 2018-01-31 MED FILL — OMEPRAZOLE 40 MG CPDR: 40 | 90 days supply | Qty: 180 | Fill #0

## 2018-02-14 MED FILL — LEVOTHYROXINE 125 MCG TABLE: 125 | 30 days supply | Qty: 30 | Fill #0

## 2018-02-14 MED FILL — ALPRAZolam 0.5 MG TABS: 0.5 | 30 days supply | Qty: 90 | Fill #0

## 2018-02-14 MED FILL — buPROPion HCL ER (XL) 150 M: 150 | 30 days supply | Qty: 30 | Fill #0

## 2018-02-26 ENCOUNTER — Encounter: Payer: Medicare Other | Admitting: Psychology

## 2018-02-26 ENCOUNTER — Encounter

## 2018-02-26 DIAGNOSIS — H01003 Unspecified blepharitis right eye, unspecified eyelid: Secondary | ICD-10-CM | POA: Diagnosis not present

## 2018-02-26 MED FILL — ERYTHROMYCIN 0.5% EYE OINT: 5 | 10 days supply | Qty: 4 | Fill #0

## 2018-03-06 ENCOUNTER — Other Ambulatory Visit: Payer: Self-pay | Admitting: Internal Medicine

## 2018-03-06 DIAGNOSIS — E118 Type 2 diabetes mellitus with unspecified complications: Secondary | ICD-10-CM

## 2018-03-06 MED FILL — metFORMIN HCL 1000 MG TABS: 1000 | 30 days supply | Qty: 60 | Fill #0

## 2018-03-06 MED FILL — DIVALPROEX SOD DR 500 MG TA: 500 | 30 days supply | Qty: 60 | Fill #0

## 2018-03-06 MED FILL — PROPRANOLOL 20 MG TABLET: 20 | 30 days supply | Qty: 60 | Fill #2

## 2018-03-06 MED FILL — EZETIMIBE 10 MG TABS: 10 | 30 days supply | Qty: 30 | Fill #0

## 2018-03-12 ENCOUNTER — Telehealth: Payer: Self-pay | Admitting: Internal Medicine

## 2018-03-12 NOTE — Telephone Encounter (Signed)
Copied from [place] [id number]. Topic: Quick Communication - Rx Refill/Question >> Mar 12, 2018  1:49 PM Windy Kalata wrote: Medication: ALPRAZolam Duanne Moron) 0.5 MG tablet   Has the patient contacted their pharmacy? No. (Agent: If no, request that the patient contact the pharmacy for the refill.) (Agent: If yes, when and what did the pharmacy advise?) Call office, patient would also like to know if she can go back to getting the 120 a month versus the 90  Preferred Pharmacy (with phone number or street name): [place], [place] - [place] [phone] (Phone) [phone] (Fax)    Agent: Please be advised that RX refills may take up to 3 business days. We ask that you follow-up with your pharmacy.

## 2018-03-12 NOTE — Telephone Encounter (Signed)
Medication not delegated to NT to refill  

## 2018-03-13 MED FILL — LEVOTHYROXINE 125 MCG TABLE: 125 | 30 days supply | Qty: 30 | Fill #1

## 2018-03-13 MED FILL — buPROPion HCL ER (XL) 150 M: 150 | 30 days supply | Qty: 30 | Fill #1

## 2018-03-14 ENCOUNTER — Other Ambulatory Visit: Payer: Self-pay | Admitting: Internal Medicine

## 2018-03-14 DIAGNOSIS — F418 Other specified anxiety disorders: Secondary | ICD-10-CM

## 2018-03-14 MED ORDER — ALPRAZOLAM 0.5 MG PO TABS: 0.5000 mg | ORAL_TABLET | Freq: Three times a day (TID) | ORAL | 3 refills | Status: DC | PRN

## 2018-03-14 MED FILL — ALPRAZolam 0.5 MG TABS: 0.5 | 30 days supply | Qty: 90 | Fill #0

## 2018-03-14 NOTE — Telephone Encounter (Signed)
I am not comfortable with a dose increase She also takes an opiate so the xanax dose has to be kept down to avoid accidental OD

## 2018-03-14 NOTE — Telephone Encounter (Signed)
Pt is requesting #120. Pt stated that she is about to jump out of her skin during the day and pt is not able to sleep at night. Pt stated that she needs to take them more than three times per day.  I informed pt that she may have to be seen. Pt stated understanding.  Please advise if you want to see pt in office first.

## 2018-03-14 NOTE — Telephone Encounter (Signed)
RX sent

## 2018-03-20 NOTE — Telephone Encounter (Signed)
Pt informed and stated understanding and that she will go and see a psychiatrist.

## 2018-04-08 ENCOUNTER — Encounter: Payer: Medicare Other | Admitting: Psychology

## 2018-04-08 MED FILL — metFORMIN HCL 1000 MG TABS: 1000 | 30 days supply | Qty: 60 | Fill #1

## 2018-04-08 MED FILL — DIVALPROEX SOD DR 500 MG TA: 500 | 30 days supply | Qty: 60 | Fill #1

## 2018-04-08 MED FILL — PROPRANOLOL 20 MG TABLET: 20 | 30 days supply | Qty: 60 | Fill #3

## 2018-04-08 MED FILL — LEVOTHYROXINE 125 MCG TABLE: 125 | 30 days supply | Qty: 30 | Fill #2

## 2018-04-08 MED FILL — buPROPion HCL ER (XL) 150 M: 150 | 30 days supply | Qty: 30 | Fill #2

## 2018-04-08 MED FILL — EZETIMIBE 10 MG TABS: 10 | 30 days supply | Qty: 30 | Fill #1

## 2018-04-15 ENCOUNTER — Other Ambulatory Visit: Payer: Self-pay | Admitting: Internal Medicine

## 2018-04-15 DIAGNOSIS — M170 Bilateral primary osteoarthritis of knee: Secondary | ICD-10-CM

## 2018-04-15 MED FILL — HYDROCODON-APAP 5-325: 5-325 | 18 days supply | Qty: 75 | Fill #0

## 2018-04-15 MED FILL — ALPRAZolam 0.5 MG TABS: 0.5 | 30 days supply | Qty: 90 | Fill #1

## 2018-05-07 ENCOUNTER — Other Ambulatory Visit: Payer: Self-pay | Admitting: Internal Medicine

## 2018-05-07 DIAGNOSIS — F418 Other specified anxiety disorders: Secondary | ICD-10-CM

## 2018-05-07 MED FILL — SERTRALINE HCL 100 MG TAB: 100 | 90 days supply | Qty: 180 | Fill #0

## 2018-05-07 MED FILL — metFORMIN HCL 1000 MG TABS: 1000 | 30 days supply | Qty: 60 | Fill #2

## 2018-05-07 MED FILL — EZETIMIBE 10 MG TABS: 10 | 30 days supply | Qty: 30 | Fill #2

## 2018-05-07 MED FILL — LEVOTHYROXINE 125 MCG TABLE: 125 | 30 days supply | Qty: 30 | Fill #3

## 2018-05-07 MED FILL — buPROPion HCL ER (XL) 150 M: 150 | 30 days supply | Qty: 30 | Fill #3

## 2018-05-07 MED FILL — OMEPRAZOLE 40 MG CPDR: 40 | 90 days supply | Qty: 180 | Fill #1

## 2018-05-10 ENCOUNTER — Ambulatory Visit: Payer: Self-pay

## 2018-05-10 NOTE — Telephone Encounter (Signed)
Virtual Visit has been made for 05/13/18.

## 2018-05-10 NOTE — Telephone Encounter (Signed)
Outgoing call to Patient.  Patient complains of a productive cough .  Brining up greenish mucous .  Denies temperature.  Reports temp is 97.0  Cough began a week ago.  Denies hemoptysis.  Patient has medicated herself with Benadryl.  States she takes two at a time.  Denies Cardiac history.  Reports having a Hx of asthma allergies.  Denies blood clots.  Reports  A little bit of wheezing.  Denies traveling  outside the country.  Patient request Dr. Ronnald Ramp to call in Rx to [facility].  Patient feels she is experiencing her allergy Sx. 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      2  Glenard Haring. [place] Female, 58 y.o., 02/09/1960 MRN:  [id number] Phone:  [phone] Jerilynn Mages) PCP:  Janith Lima, MD Primary Cvg:  MEDICARE/MEDICARE PART A AND B Next Appt With Neurology 08/06/2018 at 1:15 PM Message from Rayann Heman sent at 05/10/2018 3:46 PM EDT   Summary: cough green mucus dizziness   Pt called and stated that she has a cough and green mucus. Pt states that she has felt dizziness from the cough. Pt would like a call back regarding  ----- Message from Rayann Heman sent at 05/10/2018 3:46 PM EDT ----- Pt called and stated that she has a cough and green mucus. Pt states that she has felt dizziness from the cough. Pt would like a call back regarding         Call History    Type Contact Phone  05/10/2018 03:45 PM Phone (Incoming) [place], [place] (Self) [phone] (H)  User: Rayann Heman    Reason for Disposition . Cough  Answer Assessment - Initial Assessment Questions 1. ONSET: "When did the cough begin?"      A week ago  2. SEVERITY: "How bad is the cough today?"      moderate 3. RESPIRATORY DISTRESS: "Describe your breathing."      Denies  4. FEVER: "Do you have a fever?" If so, ask: "What is your temperature, how was it measured, and when did it start?"     denies 5. HEMOPTYSIS: "Are you coughing up any blood?" If so ask: "How  much?" (flecks, streaks, tablespoons, etc.)     denies 6. TREATMENT: "What have you done so far to treat the cough?" (e.g., meds, fluids, humidifier)     nothing 7. CARDIAC HISTORY: "Do you have any history of heart disease?" (e.g., heart attack, congestive heart failure)      denies8. LUNG HISTORY: "Do you have any history of lung disease?"  (e.g., pulmonary embolus, asthma, emphysema)     Asthma , allergies 9. PE RISK FACTORS: "Do you have a history of blood clots?" (or: recent major surgery, recent prolonged travel, bedridden)     denies 10. OTHER SYMPTOMS: "Do you have any other symptoms? (e.g., runny nose, wheezing, chest pain)       Little bit of wheezing 11. PREGNANCY: "Is there any chance you are pregnant?" "When was your last menstrual period?"       Na   12. TRAVEL: "Have you traveled out of the country in the last month?" (e.g., travel history, exposures)      denies  Protocols used: COUGH - ACUTE NON-PRODUCTIVE-A-AH

## 2018-05-13 ENCOUNTER — Ambulatory Visit: Payer: Medicare Other | Admitting: Internal Medicine

## 2018-05-13 ENCOUNTER — Encounter: Payer: Self-pay | Admitting: Internal Medicine

## 2018-05-13 MED FILL — ALPRAZolam 0.5 MG TABS: 0.5 | 30 days supply | Qty: 90 | Fill #2

## 2018-05-13 NOTE — Progress Notes (Signed)
Virtual Visit via Video Note  I connected with Tasha [name] on 05/13/18 at  4:00 PM EDT by a video enabled telemedicine application and verified that I am speaking with the correct person using two identifiers.   I discussed the limitations of evaluation and management by telemedicine and the availability of in person appointments. The patient expressed understanding and agreed to proceed.  History of Present Illness: she did not log in for a virtual visit.    Observations/Objective:   Assessment and Plan:   Follow Up Instructions:    I discussed the assessment and treatment plan with the patient. The patient was provided an opportunity to ask questions and all were answered. The patient agreed with the plan and demonstrated an understanding of the instructions.   The patient was advised to call back or seek an in-person evaluation if the symptoms worsen or if the condition fails to improve as anticipated.  I provided 0 minutes of non-face-to-face time during this encounter.   [name], MD

## 2018-06-10 MED FILL — ALPRAZolam 0.5 MG TABS: 0.5 | 30 days supply | Qty: 90 | Fill #3

## 2018-06-10 MED FILL — metFORMIN HCL 1000 MG TABS: 1000 | 30 days supply | Qty: 60 | Fill #3

## 2018-07-09 ENCOUNTER — Telehealth: Payer: Self-pay | Admitting: Internal Medicine

## 2018-07-09 DIAGNOSIS — F418 Other specified anxiety disorders: Secondary | ICD-10-CM

## 2018-07-09 MED FILL — metFORMIN HCL 1000 MG TABS: 1000 | 30 days supply | Qty: 60 | Fill #4

## 2018-07-09 MED FILL — buPROPion HCL ER (XL) 150 M: 150 | 30 days supply | Qty: 30 | Fill #4

## 2018-07-12 ENCOUNTER — Other Ambulatory Visit: Payer: Self-pay | Admitting: Internal Medicine

## 2018-07-12 DIAGNOSIS — F418 Other specified anxiety disorders: Secondary | ICD-10-CM

## 2018-07-12 NOTE — Telephone Encounter (Signed)
Patient called to check status on Xanax please advise

## 2018-07-12 NOTE — Telephone Encounter (Signed)
Pt would like to know why her meds were declined. Please call back

## 2018-07-15 ENCOUNTER — Other Ambulatory Visit: Payer: Self-pay | Admitting: Internal Medicine

## 2018-07-15 DIAGNOSIS — F418 Other specified anxiety disorders: Secondary | ICD-10-CM

## 2018-07-15 MED ORDER — ALPRAZOLAM 0.5 MG PO TABS: 0.5000 mg | ORAL_TABLET | Freq: Three times a day (TID) | ORAL | 0 refills | Status: DC | PRN

## 2018-07-15 MED FILL — ALPRAZolam 0.5 MG TABS: 0.5 | 30 days supply | Qty: 90 | Fill #0

## 2018-07-15 NOTE — Telephone Encounter (Signed)
Appt made, please advise on refill

## 2018-07-15 NOTE — Telephone Encounter (Signed)
RX sent  TJ 

## 2018-07-25 ENCOUNTER — Ambulatory Visit (INDEPENDENT_AMBULATORY_CARE_PROVIDER_SITE_OTHER): Payer: Medicare Other | Admitting: Internal Medicine

## 2018-07-25 ENCOUNTER — Other Ambulatory Visit: Payer: Self-pay

## 2018-07-25 ENCOUNTER — Other Ambulatory Visit (INDEPENDENT_AMBULATORY_CARE_PROVIDER_SITE_OTHER): Payer: Medicare Other

## 2018-07-25 ENCOUNTER — Encounter: Payer: Self-pay | Admitting: Internal Medicine

## 2018-07-25 VITALS — BP 122/70 | HR 61 | Temp 99.0°F | Resp 16 | Ht 67.0 in | Wt 167.0 lb

## 2018-07-25 DIAGNOSIS — E781 Pure hyperglyceridemia: Secondary | ICD-10-CM

## 2018-07-25 DIAGNOSIS — E039 Hypothyroidism, unspecified: Secondary | ICD-10-CM

## 2018-07-25 DIAGNOSIS — E118 Type 2 diabetes mellitus with unspecified complications: Secondary | ICD-10-CM

## 2018-07-25 DIAGNOSIS — E559 Vitamin D deficiency, unspecified: Secondary | ICD-10-CM | POA: Diagnosis not present

## 2018-07-25 DIAGNOSIS — K2100 Gastro-esophageal reflux disease with esophagitis, without bleeding: Secondary | ICD-10-CM

## 2018-07-25 DIAGNOSIS — R1310 Dysphagia, unspecified: Secondary | ICD-10-CM | POA: Diagnosis not present

## 2018-07-25 DIAGNOSIS — E785 Hyperlipidemia, unspecified: Secondary | ICD-10-CM

## 2018-07-25 DIAGNOSIS — Z79891 Long term (current) use of opiate analgesic: Secondary | ICD-10-CM

## 2018-07-25 DIAGNOSIS — K635 Polyp of colon: Secondary | ICD-10-CM | POA: Diagnosis not present

## 2018-07-25 DIAGNOSIS — K7581 Nonalcoholic steatohepatitis (NASH): Secondary | ICD-10-CM | POA: Diagnosis not present

## 2018-07-25 DIAGNOSIS — F418 Other specified anxiety disorders: Secondary | ICD-10-CM

## 2018-07-25 DIAGNOSIS — R1319 Other dysphagia: Secondary | ICD-10-CM | POA: Insufficient documentation

## 2018-07-25 DIAGNOSIS — I70219 Atherosclerosis of native arteries of extremities with intermittent claudication, unspecified extremity: Secondary | ICD-10-CM | POA: Diagnosis not present

## 2018-07-25 LAB — VITAMIN D 25 HYDROXY (VIT D DEFICIENCY, FRACTURES): VITD: 51.57 ng/mL (ref 30.00–100.00)

## 2018-07-25 LAB — BASIC METABOLIC PANEL
BUN: 15 mg/dL (ref 6–23)
CO2: 28 mEq/L (ref 19–32)
Calcium: 9.4 mg/dL (ref 8.4–10.5)
Chloride: 103 mEq/L (ref 96–112)
Creatinine, Ser: 0.81 mg/dL (ref 0.40–1.20)
GFR: 72.29 mL/min (ref >60.00)
Glucose, Bld: 125 mg/dL — ABNORMAL HIGH (ref 70–99)
Potassium: 3.9 mEq/L (ref 3.5–5.1)
Sodium: 140 mEq/L (ref 135–145)

## 2018-07-25 LAB — LIPID PANEL
Cholesterol: 196 mg/dL (ref 0–200)
HDL: 38.7 mg/dL — ABNORMAL LOW (ref >39.00)
NonHDL: 156.91
Total CHOL/HDL Ratio: 5
Triglycerides: 311 mg/dL — ABNORMAL HIGH (ref 0.0–149.0)
VLDL: 62.2 mg/dL — ABNORMAL HIGH (ref 0.0–40.0)

## 2018-07-25 LAB — HEPATIC FUNCTION PANEL
ALT: 27 U/L (ref 0–35)
AST: 19 U/L (ref 0–37)
Albumin: 4.2 g/dL (ref 3.5–5.2)
Alkaline Phosphatase: 66 U/L (ref 39–117)
Bilirubin, Direct: 0.1 mg/dL (ref 0.0–0.3)
Total Bilirubin: 0.3 mg/dL (ref 0.2–1.2)
Total Protein: 6.7 g/dL (ref 6.0–8.3)

## 2018-07-25 LAB — LDL CHOLESTEROL, DIRECT: Direct LDL: 127 mg/dL

## 2018-07-25 LAB — TSH: TSH: 1.91 u[IU]/mL (ref 0.35–4.50)

## 2018-07-25 MED ORDER — ALPRAZOLAM 1 MG PO TABS: 1.0000 mg | ORAL_TABLET | Freq: Three times a day (TID) | ORAL | 3 refills | Status: DC | PRN

## 2018-07-25 MED FILL — ALPRAZolam 1 MG TABS: 1 | 90 days supply | Qty: 90 | Fill #0

## 2018-07-25 NOTE — Progress Notes (Signed)
Subjective:  Patient ID: Tasha [name], female    DOB: Jun 13, 1960  Age: 58 y.o. MRN: [id number]  CC: Hyperlipidemia, Hypothyroidism, Diabetes, and Gastroesophageal Reflux   HPI Tasha [name] presents for f/up - She has an extensively positive review of systems.  She tells me that none of the symptoms are new or worsening.  She complains of chronic daily headache that is being treated by a neurologist.  She has spells where she describes her vision going dark.  These are associated with stress and are relieved after a dose of Xanax.  She wants to increase the Xanax dose from 0.5 mg 3 times a day to 1.0 mg 3 times a day.  She tells me that she and her husband are not getting along and she wishes she did not have to live with him anymore.  She complains of chronic neck and back pain and numbness and tingling in her upper extremities.  She tells me that she had polyps on a colonoscopy 10 years ago.  She complains of dysphagia but no odynophagia.  She is taking the PPI.  Outpatient Medications Prior to Visit  Medication Sig Dispense Refill  . buPROPion (WELLBUTRIN XL) 150 MG 24 hr tablet Take 1 tablet (150 mg total) by mouth daily. 90 tablet 1  . Cholecalciferol (VITAMIN D3) 1.25 MG (50000 UT) CAPS Take 1 capsule by mouth once a week. 12 capsule 0  . divalproex (DEPAKOTE) 500 MG DR tablet Take 1 tablet (500 mg total) by mouth 2 (two) times daily. 60 tablet 4  . ezetimibe (ZETIA) 10 MG tablet     . HYDROcodone-acetaminophen (NORCO/VICODIN) 5-325 MG tablet TAKE 1 TABLET BY MOUTH EVERY 6 HOURS AS NEEDED. NOT TO EXCEED 4 TABLETS A DAY. 75 tablet 0  . metFORMIN (GLUCOPHAGE) 1000 MG tablet TAKE 1 TABLET BY MOUTH TWICE DAILY WITH MEALS 180 tablet 1  . NARCAN 4 MG/0.1ML LIQD nasal spray kit   2  . omeprazole (PRILOSEC) 40 MG capsule Take 2 capsules (80 mg total) by mouth daily. 180 capsule 1  . propranolol (INDERAL) 20 MG tablet     . sertraline (ZOLOFT) 100 MG tablet TAKE 2 TABLETS BY MOUTH ONCE A DAY 180  tablet 1  . ALPRAZolam (XANAX) 0.5 MG tablet Take 1 tablet (0.5 mg total) by mouth 3 (three) times daily as needed for anxiety. 90 tablet 0  . aspirin EC 81 MG tablet Take 1 tablet (81 mg total) by mouth daily. 90 tablet 3  . levothyroxine (SYNTHROID, LEVOTHROID) 137 MCG tablet Take 1 tablet (137 mcg total) by mouth daily before breakfast. 90 tablet 0  . cyanocobalamin ((VITAMIN B-12)) injection 1,000 mcg      No facility-administered medications prior to visit.     ROS Review of Systems  Constitutional: Negative for diaphoresis, fatigue and unexpected weight change.  HENT: Positive for trouble swallowing. Negative for sore throat and voice change.   Eyes: Positive for visual disturbance.  Respiratory: Negative for cough, chest tightness, shortness of breath and wheezing.   Gastrointestinal: Negative for abdominal pain, blood in stool, constipation, diarrhea, nausea and vomiting.  Endocrine: Negative.  Negative for cold intolerance, heat intolerance, polydipsia, polyphagia and polyuria.  Genitourinary: Negative.  Negative for difficulty urinating, dysuria and hematuria.  Musculoskeletal: Positive for arthralgias, back pain, myalgias and neck pain.  Skin: Negative.  Negative for color change and pallor.  Neurological: Positive for numbness and headaches. Negative for tremors, seizures, syncope, speech difficulty and weakness.  Hematological: Negative for  adenopathy. Does not bruise/bleed easily.  Psychiatric/Behavioral: Negative for decreased concentration, dysphoric mood, sleep disturbance and suicidal ideas. The patient is nervous/anxious.     Objective:  BP 122/70 (BP Location: Left Arm, Patient Position: Sitting, Cuff Size: Normal)   Pulse 61   Temp 99 F (37.2 C) (Oral)   Resp 16   Ht 5' 7" (1.702 m)   Wt 167 lb (75.8 kg)   SpO2 96%   BMI 26.16 kg/m   BP Readings from Last 3 Encounters:  07/25/18 122/70  01/21/18 (!) 104/59  01/17/18 118/70    Wt Readings from Last 3  Encounters:  07/25/18 167 lb (75.8 kg)  01/21/18 170 lb 6.4 oz (77.3 kg)  01/17/18 170 lb (77.1 kg)    Physical Exam Vitals signs reviewed.  Constitutional:      Appearance: She is not ill-appearing or diaphoretic.  HENT:     Head: Normocephalic.     Nose: Nose normal.     Mouth/Throat:     Mouth: Mucous membranes are moist.     Pharynx: No oropharyngeal exudate or posterior oropharyngeal erythema.  Eyes:     General: No scleral icterus.    Extraocular Movements: Extraocular movements intact.     Conjunctiva/sclera: Conjunctivae normal.     Pupils: Pupils are equal, round, and reactive to light.  Neck:     Musculoskeletal: Normal range of motion. No neck rigidity.  Cardiovascular:     Rate and Rhythm: Normal rate and regular rhythm.     Heart sounds: No murmur. No gallop.   Pulmonary:     Effort: Pulmonary effort is normal.     Breath sounds: No stridor. No wheezing, rhonchi or rales.  Abdominal:     General: Abdomen is protuberant. Bowel sounds are normal.     Palpations: Abdomen is soft. There is no hepatomegaly or splenomegaly.     Tenderness: There is no abdominal tenderness.     Hernia: No hernia is present.  Musculoskeletal: Normal range of motion.     Right lower leg: No edema.     Left lower leg: No edema.  Lymphadenopathy:     Cervical: No cervical adenopathy.  Skin:    General: Skin is warm and dry.     Coloration: Skin is not pale.  Neurological:     General: No focal deficit present.     Mental Status: She is oriented to person, place, and time. Mental status is at baseline.  Psychiatric:        Mood and Affect: Mood normal.        Behavior: Behavior normal.        Thought Content: Thought content normal.        Judgment: Judgment normal.     Lab Results  Component Value Date   WBC 8.3 01/17/2018   HGB 14.5 01/17/2018   HCT 43.0 01/17/2018   PLT 181.0 01/17/2018   GLUCOSE 125 (H) 07/25/2018   CHOL 196 07/25/2018   TRIG 311.0 (H) 07/25/2018    HDL 38.70 (L) 07/25/2018   LDLDIRECT 127.0 07/25/2018   LDLCALC 149 (H) 04/10/2016   ALT 27 07/25/2018   AST 19 07/25/2018   NA 140 07/25/2018   K 3.9 07/25/2018   CL 103 07/25/2018   CREATININE 0.81 07/25/2018   BUN 15 07/25/2018   CO2 28 07/25/2018   TSH 1.91 07/25/2018   HGBA1C 6.8 (H) 07/25/2018   MICROALBUR 0.9 01/17/2018    Dg Abd Acute W/chest  Result Date: 01/17/2018  CLINICAL DATA:  Right flank pain for 1 year EXAM: DG ABDOMEN ACUTE W/ 1V CHEST COMPARISON:  10/24/2016 chest x-ray FINDINGS: Single-view chest demonstrates no acute opacity or pleural effusion. Normal heart size. No pneumothorax. Mild scoliosis of the spine. Supine and upright views of the abdomen demonstrate no free air beneath the diaphragm. Nonobstructed bowel-gas pattern with mild stool. No radiopaque calculi. IMPRESSION: 1. No radiographic evidence for acute cardiopulmonary abnormality. 2. Nonobstructed bowel-gas pattern Electronically Signed   By: Donavan Foil M.D.   On: 01/17/2018 15:34    Assessment & Plan:   [name] was seen today for hyperlipidemia, hypothyroidism, diabetes and gastroesophageal reflux.  Diagnoses and all orders for this visit:  Acquired hypothyroidism- Her TSH is in the normal range.  She remain on the current dose of levothyroxine. -     TSH; Future -     levothyroxine (SYNTHROID) 137 MCG tablet; Take 1 tablet (137 mcg total) by mouth daily before breakfast.  Type II diabetes mellitus with manifestations ([place])- Her blood sugar is adequately well controlled. -     Basic metabolic panel; Future -     Hemoglobin A1c; Future -     Pitavastatin Calcium (LIVALO) 1 MG TABS; Take 1 tablet (1 mg total) by mouth daily.  Nonalcoholic steatohepatitis ([facility])- Her LFTs are normal now.  She was praised for her lifestyle modifications. -     Hepatic function panel; Future  Hyperlipidemia with target LDL less than 130- She is not currently taking a statin.  She has had symptoms with prior statin so  I have asked her to try low-dose Livalo. -     Lipid panel; Future -     Pitavastatin Calcium (LIVALO) 1 MG TABS; Take 1 tablet (1 mg total) by mouth daily.  Gastroesophageal reflux disease with esophagitis -     Ambulatory referral to Gastroenterology  Encounter for long-term opiate analgesic use -     Pain Mgmt, Profile 8 w/Conf, U; Future  Vitamin D deficiency -     VITAMIN D 25 Hydroxy (Vit-D Deficiency, Fractures); Future  Depression with anxiety -     ALPRAZolam (XANAX) 1 MG tablet; Take 1 tablet (1 mg total) by mouth 3 (three) times daily as needed for anxiety.  Polyp of colon, unspecified part of colon, unspecified type -     Ambulatory referral to Gastroenterology  Esophageal dysphagia -     Ambulatory referral to Gastroenterology  Primary hypertriglyceridemia -     Icosapent Ethyl (VASCEPA) 1 g CAPS; Take 2 capsules (2 g total) by mouth 2 (two) times daily.  Extremity atherosclerosis with intermittent claudication (H[facility]) -     aspirin EC 81 MG tablet; Take 1 tablet (81 mg total) by mouth daily.   I have discontinued Glenard Haring. Posadas's ALPRAZolam. I have also changed her levothyroxine. Additionally, I am having her start on ALPRAZolam, Vascepa, and Livalo. Lastly, I am having her maintain her Narcan, Vitamin D3, omeprazole, buPROPion, divalproex, metFORMIN, HYDROcodone-acetaminophen, sertraline, ezetimibe, propranolol, and aspirin EC. We will stop administering cyanocobalamin.  Meds ordered this encounter  Medications  . ALPRAZolam (XANAX) 1 MG tablet    Sig: Take 1 tablet (1 mg total) by mouth 3 (three) times daily as needed for anxiety.    Dispense:  90 tablet    Refill:  3  . Icosapent Ethyl (VASCEPA) 1 g CAPS    Sig: Take 2 capsules (2 g total) by mouth 2 (two) times daily.    Dispense:  360 capsule  Refill:  1  . Pitavastatin Calcium (LIVALO) 1 MG TABS    Sig: Take 1 tablet (1 mg total) by mouth daily.    Dispense:  90 tablet    Refill:  1  . aspirin EC 81 MG  tablet    Sig: Take 1 tablet (81 mg total) by mouth daily.    Dispense:  90 tablet    Refill:  1  . levothyroxine (SYNTHROID) 137 MCG tablet    Sig: Take 1 tablet (137 mcg total) by mouth daily before breakfast.    Dispense:  90 tablet    Refill:  1     Follow-up: Return in about 4 months (around 11/24/2018).  [name], MD

## 2018-07-25 NOTE — Patient Instructions (Signed)
Type 2 Diabetes Mellitus, Diagnosis, Adult Type 2 diabetes (type 2 diabetes mellitus) is a long-term (chronic) disease. In type 2 diabetes, one or both of these problems may be present:  The pancreas does not make enough of a hormone called insulin.  Cells in the body do not respond properly to insulin that the body makes (insulin resistance). Normally, insulin allows blood sugar (glucose) to enter cells in the body. The cells use glucose for energy. Insulin resistance or lack of insulin causes excess glucose to build up in the blood instead of going into cells. As a result, high blood glucose (hyperglycemia) develops. What increases the risk? The following factors may make you more likely to develop type 2 diabetes:  Having a family member with type 2 diabetes.  Being overweight or obese.  Having an inactive (sedentary) lifestyle.  Having been diagnosed with insulin resistance.  Having a history of prediabetes, gestational diabetes, or polycystic ovary syndrome (PCOS).  Being of American-Indian, African-American, Hispanic/Latino, or Asian/Pacific Islander descent. What are the signs or symptoms? In the early stage of this condition, you may not have symptoms. Symptoms develop slowly and may include:  Increased thirst (polydipsia).  Increased hunger(polyphagia).  Increased urination (polyuria).  Increased urination during the night (nocturia).  Unexplained weight loss.  Frequent infections that keep coming back (recurring).  Fatigue.  Weakness.  Vision changes, such as blurry vision.  Cuts or bruises that are slow to heal.  Tingling or numbness in the hands or feet.  Dark patches on the skin (acanthosis nigricans). How is this diagnosed? This condition is diagnosed based on your symptoms, your medical history, a physical exam, and your blood glucose level. Your blood glucose may be checked with one or more of the following blood tests:  A fasting blood glucose (FBG)  test. You will not be allowed to eat (you will fast) for 8 hours or longer before a blood sample is taken.  A random blood glucose test. This test checks blood glucose at any time of day regardless of when you ate.  An A1c (hemoglobin A1c) blood test. This test provides information about blood glucose control over the previous 2-3 months.  An oral glucose tolerance test (OGTT). This test measures your blood glucose at two times: ? After fasting. This is your baseline blood glucose level. ? Two hours after drinking a beverage that contains glucose. You may be diagnosed with type 2 diabetes if:  Your FBG level is 126 mg/dL (7.0 mmol/L) or higher.  Your random blood glucose level is 200 mg/dL (11.1 mmol/L) or higher.  Your A1c level is 6.5% or higher.  Your OGTT result is higher than 200 mg/dL (11.1 mmol/L). These blood tests may be repeated to confirm your diagnosis. How is this treated? Your treatment may be managed by a specialist called an endocrinologist. Type 2 diabetes may be treated by following instructions from your health care provider about:  Making diet and lifestyle changes. This may include: ? Following an individualized nutrition plan that is developed by a diet and nutrition specialist (registered dietitian). ? Exercising regularly. ? Finding ways to manage stress.  Checking your blood glucose level as often as told.  Taking diabetes medicines or insulin daily. This helps to keep your blood glucose levels in the healthy range. ? If you use insulin, you may need to adjust the dosage depending on how physically active you are and what foods you eat. Your health care provider will tell you how to adjust your dosage.    Taking medicines to help prevent complications from diabetes, such as: ? Aspirin. ? Medicine to lower cholesterol. ? Medicine to control blood pressure. Your health care provider will set individualized treatment goals for you. Your goals will be based on  your age, other medical conditions you have, and how you respond to diabetes treatment. Generally, the goal of treatment is to maintain the following blood glucose levels:  Before meals (preprandial): 80-130 mg/dL (4.4-7.2 mmol/L).  After meals (postprandial): below 180 mg/dL (10 mmol/L).  A1c level: less than 7%. Follow these instructions at home: Questions to ask your health care provider  Consider asking the following questions: ? Do I need to meet with a diabetes educator? ? Where can I find a support group for people with diabetes? ? What equipment will I need to manage my diabetes at home? ? What diabetes medicines do I need, and when should I take them? ? How often do I need to check my blood glucose? ? What number can I call if I have questions? ? When is my next appointment? General instructions  Take over-the-counter and prescription medicines only as told by your health care provider.  Keep all follow-up visits as told by your health care provider. This is important.  For more information about diabetes, visit: ? American Diabetes Association (ADA): www.diabetes.org ? American Association of Diabetes Educators (AADE): www.diabeteseducator.org Contact a health care provider if:  Your blood glucose is at or above 240 mg/dL (13.3 mmol/L) for 2 days in a row.  You have been sick or have had a fever for 2 days or longer, and you are not getting better.  You have any of the following problems for more than 6 hours: ? You cannot eat or drink. ? You have nausea and vomiting. ? You have diarrhea. Get help right away if:  Your blood glucose is lower than 54 mg/dL (3.0 mmol/L).  You become confused or you have trouble thinking clearly.  You have difficulty breathing.  You have moderate or large ketone levels in your urine. Summary  Type 2 diabetes (type 2 diabetes mellitus) is a long-term (chronic) disease. In type 2 diabetes, the pancreas does not make enough of a  hormone called insulin, or cells in the body do not respond properly to insulin that the body makes (insulin resistance).  This condition is treated by making diet and lifestyle changes and taking diabetes medicines or insulin.  Your health care provider will set individualized treatment goals for you. Your goals will be based on your age, other medical conditions you have, and how you respond to diabetes treatment.  Keep all follow-up visits as told by your health care provider. This is important. This information is not intended to replace advice given to you by your health care provider. Make sure you discuss any questions you have with your health care provider. Document Released: 01/23/2005 Document Revised: 08/24/2016 Document Reviewed: 02/26/2015 Elsevier Interactive Patient Education  2019 Elsevier Inc.  

## 2018-07-26 ENCOUNTER — Encounter: Payer: Self-pay | Admitting: Internal Medicine

## 2018-07-26 LAB — HEMOGLOBIN A1C: Hgb A1c MFr Bld: 6.8 % — ABNORMAL HIGH (ref 4.6–6.5)

## 2018-07-26 MED ORDER — VASCEPA 1 G PO CAPS: 2.0000 | ORAL_CAPSULE | Freq: Two times a day (BID) | ORAL | 1 refills | Status: DC

## 2018-07-26 MED ORDER — LIVALO 1 MG PO TABS: 1.0000 | ORAL_TABLET | Freq: Every day | ORAL | 1 refills | Status: DC

## 2018-07-26 MED ORDER — LEVOTHYROXINE SODIUM 137 MCG PO TABS: 137.0000 ug | ORAL_TABLET | Freq: Every day | ORAL | 1 refills | Status: DC

## 2018-07-26 MED ORDER — ASPIRIN EC 81 MG PO TBEC: 81.0000 mg | DELAYED_RELEASE_TABLET | Freq: Every day | ORAL | 1 refills | Status: DC

## 2018-07-27 ENCOUNTER — Other Ambulatory Visit: Payer: Self-pay | Admitting: Internal Medicine

## 2018-07-27 LAB — PAIN MGMT, PROFILE 8 W/CONF, U
6 Acetylmorphine: NEGATIVE ng/mL
Alcohol Metabolites: NEGATIVE ng/mL (ref <500)
Alphahydroxyalprazolam: 109 ng/mL
Alphahydroxymidazolam: NEGATIVE ng/mL
Alphahydroxytriazolam: NEGATIVE ng/mL
Aminoclonazepam: NEGATIVE ng/mL
Amphetamines: NEGATIVE ng/mL
Benzodiazepines: POSITIVE ng/mL
Buprenorphine, Urine: NEGATIVE ng/mL
Cocaine Metabolite: NEGATIVE ng/mL
Creatinine: 275.4 mg/dL
Hydroxyethylflurazepam: NEGATIVE ng/mL
Lorazepam: NEGATIVE ng/mL
MDMA: NEGATIVE ng/mL
Marijuana Metabolite: NEGATIVE ng/mL
Nordiazepam: NEGATIVE ng/mL
Opiates: NEGATIVE ng/mL
Oxazepam: NEGATIVE ng/mL
Oxidant: NEGATIVE ug/mL
Oxycodone: NEGATIVE ng/mL
Temazepam: NEGATIVE ng/mL
pH: 6.8 (ref 4.5–9.0)

## 2018-07-27 NOTE — Addendum Note (Signed)
Addended by: Janith Lima on: 07/27/2018 09:22 AM   Modules accepted: Orders

## 2018-08-05 ENCOUNTER — Telehealth: Payer: Self-pay | Admitting: *Deleted

## 2018-08-05 NOTE — Telephone Encounter (Signed)
Due to current COVID 19 pandemic, our office is severely reducing in office visits until further notice, in order to minimize the risk to our patients and healthcare providers. Unable to get in contact with the patient to convert their office visit with Judson Roch 08/06/2018 into a doxy.me visit/ mychart video. I left a voicemail asking the patient to return my call. Office number was provided.     If patient calls back please convert their office visit into a doxy.me visit/ my chart video.

## 2018-08-06 ENCOUNTER — Encounter: Payer: Self-pay | Admitting: Neurology

## 2018-08-06 ENCOUNTER — Telehealth (INDEPENDENT_AMBULATORY_CARE_PROVIDER_SITE_OTHER): Payer: Medicare Other | Admitting: Neurology

## 2018-08-06 ENCOUNTER — Telehealth: Payer: Self-pay | Admitting: Internal Medicine

## 2018-08-06 ENCOUNTER — Other Ambulatory Visit: Payer: Self-pay

## 2018-08-06 DIAGNOSIS — F418 Other specified anxiety disorders: Secondary | ICD-10-CM | POA: Diagnosis not present

## 2018-08-06 DIAGNOSIS — G43001 Migraine without aura, not intractable, with status migrainosus: Secondary | ICD-10-CM

## 2018-08-06 MED ORDER — DIVALPROEX SODIUM 500 MG PO DR TAB: 500.0000 mg | DELAYED_RELEASE_TABLET | Freq: Two times a day (BID) | ORAL | 4 refills | Status: DC

## 2018-08-06 MED FILL — DIVALPROEX SOD DR 500 MG TA: 500 | 30 days supply | Qty: 60 | Fill #0

## 2018-08-06 NOTE — Telephone Encounter (Signed)
Pt was seen 6.18.20 and medications was called to pharmacy but it's to expensive. Pt want an alternative for the medication. Pt don't know the name of the medication.

## 2018-08-06 NOTE — Telephone Encounter (Signed)
Pt contacted and she stated that the [place] are too expensive.

## 2018-08-06 NOTE — Telephone Encounter (Signed)
Spoke to pt and she consented to mychart VV. Email sent.  Pt understands that although there may be some limitations with this type of visit, we will take all precautions to reduce any security or privacy concerns.  Pt understands that this will be treated like an in office visit and we will file with pt's insurance, and there may be a patient responsible charge related to this service.

## 2018-08-06 NOTE — Progress Notes (Signed)
Virtual Visit via Video Note  I connected with Tasha [name] on 08/06/18 at  1:15 PM EDT by a video enabled telemedicine application and verified that I am speaking with the correct person using two identifiers.  Location: Patient: At her home  Provider: In the office    I discussed the limitations of evaluation and management by telemedicine and the availability of in person appointments. The patient expressed understanding and agreed to proceed.  History of Present Illness: 08/06/2018 SS: Tasha [name] is a 58 year old female with history of Memory disturbance, anxiety, depression, headache, and visual disturbance.  She was trialed on propanolol, was switched to Depakote for migraines.  She had MRI of the brain in 2017/10/28 and was unremarkable. In 10/28/17, MMSE was 30/30. She has complained for visual disturbance associated with anxiety, that is relieved by Xanax. Her PCP recently increased her Xanax dosing to 1 mg three times daily as needed.   She reports continued trouble with her memory, she can be talking and feels like she doesn't know what she is saying.  She reports over the last few months she has had an increase in her headache.  She thinks this is correlated with wearing a mask.  She describes her headache location as posterior occipital area.  She reports that onset of headache is often associated with anxiety.  She reports that her anxiety is not well controlled, the smallest thing can get her nervous.  Since her primary care doctor has increased her Xanax, her headaches do seem to be improving, along with her anxiety.  When she gets a headache, she will take a Xanax or ibuprofen with good improvement.  She has found the Depakote 500 mg twice a day to be beneficial for her headaches.  She reports she is tolerating medication well.  She does report some pain in her feet, thinks she has bone spurs.  She says she has had a recent eye exam, as result of blurry vision, unable to see up  close.  She indicates they were not able to get good pictures.  She will be scheduling another appointment.  She does not work, she is on disability.  She has started a Building control [employer]" supplement drink for weight loss.  When she went to the doctor last week, she says she lost 3 pounds.  Interval history 01/21/2018 JV: Patient is being seen today for follow-up visit. She did have MRI brain which was reviewed and unremarkable for acute findings or abnormalities.  Per telephone notes, it does appears she was unable to tolerate propranolol and it was recommended to discontinue this and to initiate Depakote. She does state that she continues to have episodes of visual loss which can last for "a while" and occurs more when she is stressed out. She does state improvement of her migraines since starting the Depakote 539m twice daily.  She does endorse increased stress recently as her husbands cousin is currently living with them who was previously homeless and addicted to recreational drugs. She does state that he has recently went to rehab and has been doing better and possibly will be moving out soon. Her father passed away at the end of A09/22/24  No further concerns at this time, sometimes misses steps, can't keep balance   09/07/2017 visit Dr. WJannifer Franklin Ms. WBenitois a 58year old right-handed white female with a history of anxiety and depression.  The patient comes into the office today indicating a 10-year history of some troubles  with memory.  The patient believes that her memory issues have gotten slightly worse over time.  She has some trouble with short-term memory, difficulty with names and some word finding problems.  She is able to operate a motor vehicle without difficulty.  She is able to manage her medications and appointments.  She will go into rooms and cannot remember why she went there.  She has some intermittent numbness of the arms that may come and go, occasionally affecting the legs and feet as well.   The patient at times feels weak all over, she has some mild balance issues with occasional falls.  She denies issues controlling the bladder, she did have a recent episode of diarrhea associated with a medication side effect.  She also over the last 2 to 3 weeks has had episodes of visual dimming or loss that may occur with increased stress.  The patient claims that her husband retired a year ago and she has had increased stress associated with this.  The patient will feel anxious and nervous, if she takes Xanax this will help the visual episode to resolve quickly.  The patient is also now getting headaches about every other day, often times associated with the episodes of visual disturbance.  The visual disturbance may last up to 20 to 30 minutes.  The patient claims that she had very similar events spanning a 10-year timeframe beginning in 1992.  The patient has had recent recurrence of similar symptoms.  The patient does have a history of migraine headache.  The patient comes to this office for an evaluation.   Observations/Objective: Had to convert to a telephone visit, unable to get My Chart or [email or web address] to work for patient  Is alert and oriented, answers questions appropriately, speech is clear and concise  Assessment and Plan: 1.  Headaches 2.  Anxiety 3.  Reported memory disturbance  She indicates an increase in her headaches since wearing a mask for COVID precautions.  She reports she has had a good benefit with Depakote 500 mg twice daily in regards to her headaches.  Her headaches seem to be correlated with her anxiety.  When she gets a headache, she will take Xanax.  As of recent, her Xanax has been increased to 1 mg up to 3 times a day.  Since increasing her dose, her headaches have improved.  We discussed possibly increasing her Depakote.  For now, she has decided to hold off since the increase in Xanax has been helpful.  In the future, we will need to monitor weight gain for Tasha [name] as an  adverse side effect of Depakote.  She is currently taking a supplement drink for weight loss, had 3 lbs weight loss as of recent. She had lab work done recently, her triglycerides have gone up to 311 (219 6 months prior), A1c 6.8.  She will follow-up in 3 months or sooner if needed.  At her next visit, if we decide to continue the Depakote we will need to check a level.  She is not exhibiting any signs of Depakote toxicity at this time.  We will check a memory test at next office visit. She prefers to stay on Depakote for now, since it has been helpful for her headaches.   Follow Up Instructions: 11/07/2018 3 months    I discussed the assessment and treatment plan with the patient. The patient was provided an opportunity to ask questions and all were answered. The patient agreed with the plan and demonstrated  an understanding of the instructions.   The patient was advised to call back or seek an in-person evaluation if the symptoms worsen or if the condition fails to improve as anticipated.  I provided 25 minutes of non-face-to-face time during this encounter.   Evangeline Dakin, DNP  [facility] [address] [place], [place] [address] [phone]

## 2018-08-06 NOTE — Progress Notes (Signed)
I have read the note, and I agree with the clinical assessment and plan.  Brylon Brenning K Callan Yontz   

## 2018-08-07 ENCOUNTER — Other Ambulatory Visit: Payer: Self-pay | Admitting: Internal Medicine

## 2018-08-07 DIAGNOSIS — I70219 Atherosclerosis of native arteries of extremities with intermittent claudication, unspecified extremity: Secondary | ICD-10-CM

## 2018-08-07 DIAGNOSIS — E781 Pure hyperglyceridemia: Secondary | ICD-10-CM

## 2018-08-07 DIAGNOSIS — E785 Hyperlipidemia, unspecified: Secondary | ICD-10-CM

## 2018-08-07 MED ORDER — OMEGA-3-ACID ETHYL ESTERS 1 G PO CAPS: 2.0000 g | ORAL_CAPSULE | Freq: Two times a day (BID) | ORAL | 1 refills | Status: DC

## 2018-08-07 MED ORDER — PRAVASTATIN SODIUM 10 MG PO TABS: 10.0000 mg | ORAL_TABLET | Freq: Every day | ORAL | 1 refills | Status: DC

## 2018-08-07 MED FILL — OMEGA-3 ETHYL ESTERS 1 GM C: 1 | 90 days supply | Qty: 360 | Fill #0

## 2018-08-07 MED FILL — PRAVASTATIN SODIUM 10 MG TA: 10 | 90 days supply | Qty: 90 | Fill #0

## 2018-08-07 NOTE — Telephone Encounter (Signed)
Generic alternatives sent   TJ

## 2018-08-08 NOTE — Telephone Encounter (Signed)
lvm for pt informing of same °

## 2018-08-13 ENCOUNTER — Other Ambulatory Visit: Payer: Self-pay | Admitting: Internal Medicine

## 2018-08-13 DIAGNOSIS — K219 Gastro-esophageal reflux disease without esophagitis: Secondary | ICD-10-CM

## 2018-08-13 MED FILL — buPROPion HCL ER (XL) 150 M: 150 | 30 days supply | Qty: 30 | Fill #5

## 2018-08-13 MED FILL — metFORMIN HCL 1000 MG TABS: 1000 | 30 days supply | Qty: 60 | Fill #5

## 2018-08-13 MED FILL — EZETIMIBE 10 MG TABS: 10 | 90 days supply | Qty: 90 | Fill #0

## 2018-08-13 MED FILL — LEVOTHYROXINE 125 MCG TABLE: 125 | 30 days supply | Qty: 30 | Fill #4

## 2018-08-13 MED FILL — OMEPRAZOLE 40 MG CPDR: 40 | 90 days supply | Qty: 180 | Fill #0

## 2018-08-13 MED FILL — SERTRALINE HCL 100 MG TABS: 100 | 90 days supply | Qty: 180 | Fill #1

## 2018-09-06 ENCOUNTER — Other Ambulatory Visit: Payer: Self-pay | Admitting: Internal Medicine

## 2018-09-06 DIAGNOSIS — F418 Other specified anxiety disorders: Secondary | ICD-10-CM

## 2018-09-06 DIAGNOSIS — E118 Type 2 diabetes mellitus with unspecified complications: Secondary | ICD-10-CM

## 2018-09-06 MED FILL — ALPRAZolam 1 MG TABS: 1 | 30 days supply | Qty: 90 | Fill #1

## 2018-09-07 MED FILL — metFORMIN HCL 1000 MG TABS: 1000 | 90 days supply | Qty: 180 | Fill #0

## 2018-09-07 MED FILL — buPROPion HCL ER (XL) 150 M: 150 | 90 days supply | Qty: 90 | Fill #0

## 2018-09-11 MED FILL — DIVALPROEX SOD DR 500 MG TA: 500 | 30 days supply | Qty: 60 | Fill #1

## 2018-10-07 MED FILL — ALPRAZolam 1 MG TABS: 1 | 30 days supply | Qty: 90 | Fill #2

## 2018-10-09 ENCOUNTER — Telehealth: Payer: Self-pay | Admitting: Internal Medicine

## 2018-10-09 DIAGNOSIS — Z1231 Encounter for screening mammogram for malignant neoplasm of breast: Secondary | ICD-10-CM

## 2018-10-09 DIAGNOSIS — E2839 Other primary ovarian failure: Secondary | ICD-10-CM

## 2018-10-09 DIAGNOSIS — Z1239 Encounter for other screening for malignant neoplasm of breast: Secondary | ICD-10-CM

## 2018-10-09 NOTE — Telephone Encounter (Signed)
Orders entered as requested.

## 2018-10-09 NOTE — Telephone Encounter (Signed)
Pt states she needs an order for a bone density AND a 3 D screening mammogram[facility].

## 2018-10-30 ENCOUNTER — Emergency Department (HOSPITAL_COMMUNITY): Payer: Medicare Other

## 2018-10-30 ENCOUNTER — Ambulatory Visit: Payer: Self-pay | Admitting: *Deleted

## 2018-10-30 ENCOUNTER — Emergency Department (HOSPITAL_COMMUNITY)
Admission: EM | Admit: 2018-10-30 | Discharge: 2018-10-30 | Disposition: A | Discharge status: Home / Self Care | Payer: Medicare Other | Attending: Emergency Medicine | Admitting: Emergency Medicine

## 2018-10-30 DIAGNOSIS — K921 Melena: Secondary | ICD-10-CM | POA: Diagnosis not present

## 2018-10-30 DIAGNOSIS — F0390 Unspecified dementia without behavioral disturbance: Secondary | ICD-10-CM | POA: Insufficient documentation

## 2018-10-30 DIAGNOSIS — E039 Hypothyroidism, unspecified: Secondary | ICD-10-CM | POA: Diagnosis not present

## 2018-10-30 DIAGNOSIS — R1010 Upper abdominal pain, unspecified: Secondary | ICD-10-CM | POA: Insufficient documentation

## 2018-10-30 DIAGNOSIS — Z7984 Long term (current) use of oral hypoglycemic drugs: Secondary | ICD-10-CM | POA: Insufficient documentation

## 2018-10-30 DIAGNOSIS — M549 Dorsalgia, unspecified: Secondary | ICD-10-CM | POA: Diagnosis not present

## 2018-10-30 DIAGNOSIS — K625 Hemorrhage of anus and rectum: Secondary | ICD-10-CM

## 2018-10-30 DIAGNOSIS — F17210 Nicotine dependence, cigarettes, uncomplicated: Secondary | ICD-10-CM | POA: Insufficient documentation

## 2018-10-30 DIAGNOSIS — R109 Unspecified abdominal pain: Secondary | ICD-10-CM

## 2018-10-30 DIAGNOSIS — Z7982 Long term (current) use of aspirin: Secondary | ICD-10-CM | POA: Diagnosis not present

## 2018-10-30 DIAGNOSIS — Z79899 Other long term (current) drug therapy: Secondary | ICD-10-CM | POA: Diagnosis not present

## 2018-10-30 DIAGNOSIS — M545 Low back pain: Secondary | ICD-10-CM | POA: Insufficient documentation

## 2018-10-30 DIAGNOSIS — R1084 Generalized abdominal pain: Secondary | ICD-10-CM | POA: Diagnosis not present

## 2018-10-30 DIAGNOSIS — E119 Type 2 diabetes mellitus without complications: Secondary | ICD-10-CM | POA: Insufficient documentation

## 2018-10-30 LAB — URINALYSIS, ROUTINE W REFLEX MICROSCOPIC
Bilirubin Urine: NEGATIVE
Glucose, UA: NEGATIVE mg/dL
Hgb urine dipstick: NEGATIVE
Ketones, ur: 5 mg/dL — AB
Leukocytes,Ua: NEGATIVE
Nitrite: NEGATIVE
Protein, ur: NEGATIVE mg/dL
Specific Gravity, Urine: 1.025 (ref 1.005–1.030)
pH: 5 (ref 5.0–8.0)

## 2018-10-30 LAB — CBC
HCT: 45.9 % (ref 36.0–46.0)
Hemoglobin: 14.9 g/dL (ref 12.0–15.0)
MCH: 31.6 pg (ref 26.0–34.0)
MCHC: 32.5 g/dL (ref 30.0–36.0)
MCV: 97.2 fL (ref 80.0–100.0)
Platelets: 183 10*3/uL (ref 150–400)
RBC: 4.72 MIL/uL (ref 3.87–5.11)
RDW: 12.6 % (ref 11.5–15.5)
WBC: 7.8 10*3/uL (ref 4.0–10.5)
nRBC: 0 % (ref 0.0–0.2)

## 2018-10-30 LAB — COMPREHENSIVE METABOLIC PANEL
ALT: 30 U/L (ref 0–44)
AST: 29 U/L (ref 15–41)
Albumin: 4 g/dL (ref 3.5–5.0)
Alkaline Phosphatase: 68 U/L (ref 38–126)
Anion gap: 10 (ref 5–15)
BUN: 10 mg/dL (ref 6–20)
CO2: 28 mmol/L (ref 22–32)
Calcium: 9.3 mg/dL (ref 8.9–10.3)
Chloride: 101 mmol/L (ref 98–111)
Creatinine, Ser: 0.77 mg/dL (ref 0.44–1.00)
GFR calc Af Amer: >60 mL/min (ref >60)
GFR calc non Af Amer: >60 mL/min (ref >60)
Glucose, Bld: 103 mg/dL — ABNORMAL HIGH (ref 70–99)
Potassium: 4.2 mmol/L (ref 3.5–5.1)
Sodium: 139 mmol/L (ref 135–145)
Total Bilirubin: 0.6 mg/dL (ref 0.3–1.2)
Total Protein: 7 g/dL (ref 6.5–8.1)

## 2018-10-30 LAB — LIPASE, BLOOD: Lipase: 26 U/L (ref 11–51)

## 2018-10-30 MED ORDER — IOHEXOL 300 MG/ML  SOLN
100.0000 mL | Freq: Once | INTRAMUSCULAR | Status: AC | PRN
  Administered 2018-10-30: 100 mL via INTRAVENOUS

## 2018-10-30 MED ORDER — SODIUM CHLORIDE 0.9% FLUSH: 3.0000 mL | Freq: Once | INTRAVENOUS | Status: DC

## 2018-10-30 NOTE — Discharge Instructions (Addendum)
Follow-up with your GI doctor and/or primary care doctor, take over-the-counter medications as needed for pain, return as needed

## 2018-10-30 NOTE — ED Notes (Signed)
Patient verbalizes understanding of discharge instructions. Opportunity for questioning and answers were provided. Armband removed by staff, pt discharged from ED ambulatory.   

## 2018-10-30 NOTE — Telephone Encounter (Signed)
  Pt called in concerned about seeing blood in her stools for the past 2-3 days, having abd pain on the right side that has spread around into her right lower back.   She describes her stools as maroon colored, thick, pasty and very foul smelling.   She also is c/o her breath being very bad.  Also having some dizziness and weakness.  I have referred her to the ED.   She is going to [facility].  I have sent these triage notes to Dr. Ronnald Ramp so he would be aware of the referral.  Reason for Disposition . Tarry or jet black-colored stool (not dark green)  Answer Assessment - Initial Assessment Questions 1. APPEARANCE of BLOOD: "What color is it?" "Is it passed separately, on the surface of the stool, or mixed in with the stool?"      Sometimes bright red blood in my stool for 3 days.   Also right lower back pain and abd pain. 2. AMOUNT: "How much blood was passed?"      I've seen blood yesterday morning real good.   I had diarrhea this morning and it's a maroon color.   3. FREQUENCY: "How many times has blood been passed with the stools?"      I've had this back pain before.   I've been trying to get a colonoscopy for 2 years.    4. ONSET: "When was the blood first seen in the stools?" (Days or weeks)      3 days ago.   Yesterday morning I saw more blood. 5. DIARRHEA: "Is there also some diarrhea?" If so, ask: "How many diarrhea stools were passed in past 24 hours?"      Yes this morning  I've had diarrhea since he gave me a shot a year ago for the diabetes.    When I have to go I have to go quickly. 6. CONSTIPATION: "Do you have constipation?" If so, "How bad is it?"     No 7. RECURRENT SYMPTOMS: "Have you had blood in your stools before?" If so, ask: "When was the last time?" and "What happened that time?"      No  My lower back on the right side is swollen.   I feel pulsating in my right lower back and middle of my lower back.   It started in my front abd.   Now it's hurting to the bottom of  my butt. 8. BLOOD THINNERS: "Do you take any blood thinners?" (e.g., Coumadin/warfarin, Pradaxa/dabigatran, aspirin)     No  9. OTHER SYMPTOMS: "Do you have any other symptoms?"  (e.g., abdominal pain, vomiting, dizziness, fever)     Weak, dizzy.   10. PREGNANCY: "Is there any chance you are pregnant?" "When was your last menstrual period?" Hysterectomy  Protocols used: RECTAL BLEEDING-A-AH

## 2018-10-30 NOTE — ED Provider Notes (Signed)
[facility] EMERGENCY DEPARTMENT Provider Note   CSN: [id number] Arrival date & time: 10/30/18  1033     History   Chief Complaint Chief Complaint  Patient presents with  . Abdominal Pain  . Flank Pain    HPI Tasha [name] is a 58 y.o. female.     HPI Pt started having pain in her abdomen on Friday.  The pain is in her upper abdomen on the right but has moved down her lower into her back.  It feels like a pulsing pain and certain movements make it worse.  No vomiting.  No trouble with diarrhea or consitpation.    She has noticed blood on the toilet tissue when wiping over the last several days.  No stools with blood only. Past Medical History:  Diagnosis Date  . Anxiety state, unspecified   . Arthralgia of temporomandibular joint   . Conversion disorder   . Dementia ([place])   . Depressive disorder, not elsewhere classified   . Dysphagia, unspecified(787.20)   . Endometriosis   . Esophageal reflux   . Fibromyalgia   . Headache(784.0)   . Irritable bowel syndrome   . Mitral valve disorders(424.0)   . Myalgia and myositis, unspecified   . Overweight(278.02)   . Periodic limb movement disorder   . Pure hypercholesterolemia   . Unspecified hypothyroidism     Patient Active Problem List   Diagnosis Date Noted  . Hyperglyceridemia, pure 08/07/2018  . Polyp of colon 07/25/2018  . Esophageal dysphagia 07/25/2018  . HIV antibody positive ([place]) 09/27/2017  . Mild intermittent asthma without complication 39/04/90  . Encounter for long-term opiate analgesic use 10/24/2016  . Primary insomnia 04/10/2016  . Extremity atherosclerosis with intermittent claudication ([place]) 04/10/2016  . Routine general medical examination at a health care facility 12/20/2014  . Visit for screening mammogram 12/16/2014  . Cervical cancer screening 12/16/2014  . Primary osteoarthritis of both knees 06/22/2014  . Migraine without aura and with status migrainosus, not intractable  02/03/2014  . Nonspecific abnormal electrocardiogram (ECG) (EKG) 12/01/2013  . Type II diabetes mellitus with manifestations ([place]) 09/11/2013  . Estrogen deficiency 05/20/2013  . Vitamin D deficiency 04/23/2013  . OBESITY, MILD 06/24/2008  . Hyperlipidemia with target LDL less than 130 09/08/2007  . Periodic limb movement disorder 09/08/2007  . CIGARETTE SMOKER 09/04/2007  . Hypothyroidism 11/22/2006  . Depression with anxiety 11/22/2006  . GERD 11/22/2006  . IBS 11/22/2006  . Nonalcoholic steatohepatitis ([place]) 11/22/2006    Past Surgical History:  Procedure Laterality Date  . ABDOMINAL HYSTERECTOMY     TAH  . arm surgery    . FOOT SURGERY    . LIPOMA EXCISION     BESIDES BELLY BUTTON  . TONSILLECTOMY    . WRIST SURGERY       OB History    Gravida  5   Para  2   Term      Preterm      AB  3   Living  2     SAB  2   TAB      Ectopic  1   Multiple      Live Births               Home Medications    Prior to Admission medications   Medication Sig Start Date End Date Taking? Authorizing Provider  ALPRAZolam Duanne Moron) 1 MG tablet Take 1 tablet (1 mg total) by mouth 3 (three) times daily as needed  for anxiety. 07/25/18   Janith Lima, MD  aspirin EC 81 MG tablet Take 1 tablet (81 mg total) by mouth daily. 07/26/18   Janith Lima, MD  buPROPion (WELLBUTRIN XL) 150 MG 24 hr tablet TAKE 1 TABLET BY MOUTH ONCE A DAY 09/07/18   Janith Lima, MD  Cholecalciferol (VITAMIN D3) 1.25 MG (50000 UT) CAPS Take 1 capsule by mouth once a week. 01/17/18   Janith Lima, MD  divalproex (DEPAKOTE) 500 MG DR tablet Take 1 tablet (500 mg total) by mouth 2 (two) times daily. 08/06/18   Suzzanne Cloud, NP  ezetimibe (ZETIA) 10 MG tablet TAKE 1 TABLET BY MOUTH ONCE DAILY 08/13/18   Janith Lima, MD  levothyroxine (SYNTHROID) 137 MCG tablet Take 1 tablet (137 mcg total) by mouth daily before breakfast. 07/26/18   Janith Lima, MD  metFORMIN (GLUCOPHAGE) 1000 MG tablet  TAKE 1 TABLET BY MOUTH TWICE DAILY WITH MEALS 09/07/18   Janith Lima, MD  [facility] 4 MG/0.1ML LIQD nasal spray kit  07/16/17   [provider]  omega-3 acid ethyl esters (LOVAZA) 1 g capsule Take 2 capsules (2 g total) by mouth 2 (two) times daily. 08/07/18   Janith Lima, MD  omeprazole (PRILOSEC) 40 MG capsule TAKE 2 CAPSULES BY MOUTH ONCE A DAY 08/13/18   Janith Lima, MD  pravastatin (PRAVACHOL) 10 MG tablet Take 1 tablet (10 mg total) by mouth daily. 08/07/18   Janith Lima, MD  sertraline (ZOLOFT) 100 MG tablet TAKE 2 TABLETS BY MOUTH ONCE A DAY 05/07/18   Janith Lima, MD    Family History Family History  Problem Relation Age of Onset  . Heart disease Mother   . Heart disease Father   . Diabetes Father   . Breast cancer Maternal Aunt   . Colon cancer Paternal Aunt     Social History Social History   Tobacco Use  . Smoking status: Current Every Day Smoker    Packs/day: 1.00    Types: Cigarettes  . Smokeless tobacco: Never Used  . Tobacco comment: 5 or 6 per day  Substance Use Topics  . Alcohol use: Yes    Alcohol/week: 1.0 standard drinks    Types: 1 Glasses of wine per week    Comment: rarely  . Drug use: No     Allergies   Farxiga [dapagliflozin], Statins, Crestor [rosuvastatin], Lipitor [atorvastatin], and Zocor [simvastatin]   Review of Systems Review of Systems  All other systems reviewed and are negative.    Physical Exam Updated Vital Signs BP (!) 119/40   Pulse 63   Temp 98 F (36.7 C) (Oral)   Resp 14   SpO2 98%   Physical Exam Vitals signs and nursing note reviewed.  Constitutional:      General: She is not in acute distress.    Appearance: She is well-developed.  HENT:     Head: Normocephalic and atraumatic.     Right Ear: External ear normal.     Left Ear: External ear normal.  Eyes:     General: No scleral icterus.       Right eye: No discharge.        Left eye: No discharge.     Conjunctiva/sclera: Conjunctivae  normal.  Neck:     Musculoskeletal: Neck supple.     Trachea: No tracheal deviation.  Cardiovascular:     Rate and Rhythm: Normal rate and regular rhythm.  Pulmonary:  Effort: Pulmonary effort is normal. No respiratory distress.     Breath sounds: Normal breath sounds. No stridor. No wheezing or rales.  Abdominal:     General: Bowel sounds are normal. There is no distension.     Palpations: Abdomen is soft.     Tenderness: There is no abdominal tenderness. There is no guarding or rebound.  Musculoskeletal:        General: No tenderness.  Skin:    General: Skin is warm and dry.     Findings: No rash.  Neurological:     Mental Status: She is alert.     Cranial Nerves: No cranial nerve deficit (no facial droop, extraocular movements intact, no slurred speech).     Sensory: No sensory deficit.     Motor: No abnormal muscle tone or seizure activity.     Coordination: Coordination normal.      ED Treatments / Results  Labs (all labs ordered are listed, but only abnormal results are displayed) Labs Reviewed  COMPREHENSIVE METABOLIC PANEL - Abnormal; Notable for the following components:      Result Value   Glucose, Bld 103 (*)    All other components within normal limits  URINALYSIS, ROUTINE W REFLEX MICROSCOPIC - Abnormal; Notable for the following components:   Ketones, ur 5 (*)    All other components within normal limits  LIPASE, BLOOD  CBC    EKG None  Radiology Ct Abdomen Pelvis W Contrast  Result Date: 10/30/2018 CLINICAL DATA:  Acute generalized abdominal pain. Bloody stools. EXAM: CT ABDOMEN AND PELVIS WITH CONTRAST TECHNIQUE: Multidetector CT imaging of the abdomen and pelvis was performed using the standard protocol following bolus administration of intravenous contrast. CONTRAST:  167m OMNIPAQUE IOHEXOL 300 MG/ML  SOLN COMPARISON:  10/13/2015. FINDINGS: Lower chest: The lung bases are clear. The heart size is normal. Hepatobiliary: The liver is normal. Normal  gallbladder.There is no biliary ductal dilation. Pancreas: Normal contours without ductal dilatation. No peripancreatic fluid collection. Spleen: No splenic laceration or hematoma. Adrenals/Urinary Tract: --Adrenal glands: No adrenal hemorrhage. --Right kidney/ureter: No hydronephrosis or perinephric hematoma. --Left kidney/ureter: No hydronephrosis or perinephric hematoma. --Urinary bladder: Unremarkable. Stomach/Bowel: --Stomach/Duodenum: No hiatal hernia or other gastric abnormality. Normal duodenal course and caliber. --Small bowel: No dilatation or inflammation. --Colon: No focal abnormality. --Appendix: Normal. Vascular/Lymphatic: Atherosclerotic calcification is present within the non-aneurysmal abdominal aorta, without hemodynamically significant stenosis. --No retroperitoneal lymphadenopathy. --No mesenteric lymphadenopathy. --No pelvic or inguinal lymphadenopathy. Reproductive: Unremarkable Other: No ascites or free air. The abdominal wall is normal. Musculoskeletal. No acute displaced fractures. IMPRESSION: No acute abdominopelvic abnormality. Aortic Atherosclerosis (ICD10-I70.0). Electronically Signed   By: CConstance HolsterM.D.   On: 10/30/2018 15:12    Procedures Procedures (including critical care time)  Medications Ordered in ED Medications  sodium chloride flush (NS) 0.9 % injection 3 mL (has no administration in time range)  iohexol (OMNIPAQUE) 300 MG/ML solution 100 mL (100 mLs Intravenous Contrast Given 10/30/18 1444)     Initial Impression / Assessment and Plan / ED Course  I have reviewed the triage vital signs and the nursing notes.  Pertinent labs & imaging results that were available during my care of the patient were reviewed by me and considered in my medical decision making (see chart for details).   Patient's ED work-up is reassuring.  No definite etiology for her abdominal pain.  Lab tests and CT scan are unremarkable.  Patient has noted some blood in her stool.  She  states she is  due for another colonoscopy.  Recommend follow up with her GI doctor and PCP regarding her back pain. Final Clinical Impressions(s) / ED Diagnoses   Final diagnoses:  Abdominal pain, unspecified abdominal location  Rectal bleeding    ED Discharge Orders    None       Dorie Rank, MD 10/30/18 [phone]

## 2018-10-30 NOTE — ED Triage Notes (Signed)
To ED for eval of right upper abd pain with radiation to right flank for past 4 days. States she noticed blood in stool 3 days ago. Denies vomiting. Has had diarrhea.

## 2018-11-04 MED FILL — ALPRAZolam 1 MG TABS: 1 | 30 days supply | Qty: 90 | Fill #3

## 2018-11-07 ENCOUNTER — Encounter: Payer: Self-pay | Admitting: Neurology

## 2018-11-07 ENCOUNTER — Other Ambulatory Visit: Payer: Self-pay

## 2018-11-07 ENCOUNTER — Ambulatory Visit (INDEPENDENT_AMBULATORY_CARE_PROVIDER_SITE_OTHER): Payer: Medicare Other | Admitting: Neurology

## 2018-11-07 VITALS — BP 118/59 | HR 64 | Temp 96.6°F | Ht 67.0 in | Wt 165.0 lb

## 2018-11-07 DIAGNOSIS — G43001 Migraine without aura, not intractable, with status migrainosus: Secondary | ICD-10-CM | POA: Diagnosis not present

## 2018-11-07 NOTE — Progress Notes (Signed)
PATIENT: Tasha [name] DOB: [birth year]  REASON FOR VISIT: follow up HISTORY FROM: patient  HISTORY OF PRESENT ILLNESS: Today 11/07/18  Tasha [name] is a 58 year old female with history of memory disturbance, anxiety, depression, headache, and visual disturbance.  She remains on Depakote for migraines.  She is also taking Xanax for anxiety 1 mg three times a day as needed. She reports her headaches have improved while taking Depakote, but is still having 3-4 a week.  Her headaches are triggered by anxiety and nervousness.  She has a particularly stressful family situation.  When she gets nervous, she will develop a visual disturbance, feels like she gets tunnel vision, like a film covers both eyes.  This only occurs as result of anxiety.  Following the event, she will develop a headache.  She will have to take a Xanax and the headache will improve. She is concerned about her weight overtime while on Depakote. She does report issues with her balance. Last week she stumbling while walking.  She presents today for follow-up unaccompanied.  HISTORY 08/06/2018 SS: Tasha [name] is a 58 year old female with history of Memory disturbance, anxiety, depression, headache, and visual disturbance.  She was trialed on propanolol, was switched to Depakote for migraines.  She had MRI of the brain in August 2019 and was unremarkable. In August 2019, MMSE was 30/30. She has complained for visual disturbance associated with anxiety, that is relieved by Xanax. Her PCP recently increased her Xanax dosing to 1 mg three times daily as needed.   She reports continued trouble with her memory, she can be talking and feels like she doesn't know what she is saying.  She reports over the last few months she has had an increase in her headache.  She thinks this is correlated with wearing a mask.  She describes her headache location as posterior occipital area.  She reports that onset of headache is often associated with anxiety.  She  reports that her anxiety is not well controlled, the smallest thing can get her nervous.  Since her primary care doctor has increased her Xanax, her headaches do seem to be improving, along with her anxiety.  When she gets a headache, she will take a Xanax or ibuprofen with good improvement.  She has found the Depakote 500 mg twice a day to be beneficial for her headaches.  She reports she is tolerating medication well.  She does report some pain in her feet, thinks she has bone spurs.  She says she has had a recent eye exam, as result of blurry vision, unable to see up close.  She indicates they were not able to get good pictures.  She will be scheduling another appointment.  She does not work, she is on disability.  She has started a Building control surveyor" supplement drink for weight loss.  When she went to the doctor last week, she says she lost 3 pounds.   REVIEW OF SYSTEMS: Out of a complete 14 system review of symptoms, the patient complains only of the following symptoms, and all other reviewed systems are negative.  Headache, anxiety  ALLERGIES: Allergies  Allergen Reactions  . Wilder Glade [Dapagliflozin] Other (See Comments)    Vag yeast inf  . Statins Other (See Comments)    Muscle weakness/pains  . Crestor [Rosuvastatin] Other (See Comments)    Muscle aches  . Lipitor [Atorvastatin] Other (See Comments)    Muscle aches  . Zocor [Simvastatin] Other (See Comments)    Leg pain  per patient/PE    HOME MEDICATIONS: Outpatient Medications Prior to Visit  Medication Sig Dispense Refill  . ALPRAZolam (XANAX) 1 MG tablet Take 1 tablet (1 mg total) by mouth 3 (three) times daily as needed for anxiety. 90 tablet 3  . aspirin EC 81 MG tablet Take 1 tablet (81 mg total) by mouth daily. 90 tablet 1  . buPROPion (WELLBUTRIN XL) 150 MG 24 hr tablet TAKE 1 TABLET BY MOUTH ONCE A DAY 90 tablet 1  . divalproex (DEPAKOTE) 500 MG DR tablet Take 1 tablet (500 mg total) by mouth 2 (two) times daily. 60 tablet 4  .  ezetimibe (ZETIA) 10 MG tablet TAKE 1 TABLET BY MOUTH ONCE DAILY 90 tablet 1  . levothyroxine (SYNTHROID) 137 MCG tablet Take 1 tablet (137 mcg total) by mouth daily before breakfast. 90 tablet 1  . metFORMIN (GLUCOPHAGE) 1000 MG tablet TAKE 1 TABLET BY MOUTH TWICE DAILY WITH MEALS 180 tablet 1  . NARCAN 4 MG/0.1ML LIQD nasal spray kit   2  . omega-3 acid ethyl esters (LOVAZA) 1 g capsule Take 2 capsules (2 g total) by mouth 2 (two) times daily. 360 capsule 1  . omeprazole (PRILOSEC) 40 MG capsule TAKE 2 CAPSULES BY MOUTH ONCE A DAY 180 capsule 1  . pravastatin (PRAVACHOL) 10 MG tablet Take 1 tablet (10 mg total) by mouth daily. 90 tablet 1  . sertraline (ZOLOFT) 100 MG tablet TAKE 2 TABLETS BY MOUTH ONCE A DAY 180 tablet 1  . Cholecalciferol (VITAMIN D3) 1.25 MG (50000 UT) CAPS Take 1 capsule by mouth once a week. (Patient not taking: Reported on 11/07/2018) 12 capsule 0   No facility-administered medications prior to visit.     PAST MEDICAL HISTORY: Past Medical History:  Diagnosis Date  . Anxiety state, unspecified   . Arthralgia of temporomandibular joint   . Conversion disorder   . Dementia ([place])   . Depressive disorder, not elsewhere classified   . Dysphagia, unspecified(787.20)   . Endometriosis   . Esophageal reflux   . Fibromyalgia   . Headache(784.0)   . Irritable bowel syndrome   . Mitral valve disorders(424.0)   . Myalgia and myositis, unspecified   . Overweight(278.02)   . Periodic limb movement disorder   . Pure hypercholesterolemia   . Unspecified hypothyroidism     PAST SURGICAL HISTORY: Past Surgical History:  Procedure Laterality Date  . ABDOMINAL HYSTERECTOMY     TAH  . arm surgery    . FOOT SURGERY    . LIPOMA EXCISION     BESIDES BELLY BUTTON  . TONSILLECTOMY    . WRIST SURGERY      FAMILY HISTORY: Family History  Problem Relation Age of Onset  . Heart disease Mother   . Heart disease Father   . Diabetes Father   . Breast cancer Maternal Aunt    . Colon cancer Paternal Aunt     SOCIAL HISTORY: Social History   Socioeconomic History  . Marital status: Married    Spouse name: Water quality scientist [place]  . Number of children: 2  . Years of education: 57  . Highest education level: Not on file  Occupational History  . Occupation: Disabled    Employer: [employer]  . Financial resource strain: Not on file  . Food insecurity    Worry: Not on file    Inability: Not on file  . Transportation needs    Medical: Not on file    Non-medical: Not on  file  Tobacco Use  . Smoking status: Current Every Day Smoker    Packs/day: 1.00    Types: Cigarettes  . Smokeless tobacco: Never Used  . Tobacco comment: 5 or 6 per day  Substance and Sexual Activity  . Alcohol use: Yes    Alcohol/week: 1.0 standard drinks    Types: 1 Glasses of wine per week    Comment: rarely  . Drug use: No  . Sexual activity: Yes  Lifestyle  . Physical activity    Days per week: Not on file    Minutes per session: Not on file  . Stress: Not on file  Relationships  . Social Herbalist on phone: Not on file    Gets together: Not on file    Attends religious service: Not on file    Active member of club or organization: Not on file    Attends meetings of clubs or organizations: Not on file    Relationship status: Not on file  . Intimate partner violence    Fear of current or ex partner: Not on file    Emotionally abused: Not on file    Physically abused: Not on file    Forced sexual activity: Not on file  Other Topics Concern  . Not on file  Social History Narrative   Lives w/ husband [facility]   Caffeine use: 2 cups coffee per day   Tea-3-4 drinks   Right handed     PHYSICAL EXAM  Vitals:   11/07/18 1120  BP: (!) 118/59  Pulse: 64  Temp: (!) 96.6 F (35.9 C)  Weight: 165 lb (74.8 kg)  Height: 5' 7"  (1.702 m)   Body mass index is 25.84 kg/m.  Generalized: Well developed, in no acute distress    Neurological examination  Mentation: Alert oriented to time, place, history taking. Follows all commands speech and language fluent Cranial nerve II-XII: Pupils were equal round reactive to light. Extraocular movements were full, visual field were full on confrontational test. Facial sensation and strength were normal.  Head turning and shoulder shrug  were normal and symmetric. Motor: The motor testing reveals 5 over 5 strength of all 4 extremities. Good symmetric motor tone is noted throughout.  Sensory: Sensory testing is intact to soft touch on all 4 extremities. No evidence of extinction is noted.  Coordination: Cerebellar testing reveals good finger-nose-finger and heel-to-shin bilaterally.  Gait and station: Gait is normal. Tandem gait is normal. Romberg is negative. No drift is seen.  Reflexes: Deep tendon reflexes are symmetric and normal bilaterally.   DIAGNOSTIC DATA (LABS, IMAGING, TESTING) - I reviewed patient records, labs, notes, testing and imaging myself where available.  Lab Results  Component Value Date   WBC 7.8 10/30/2018   HGB 14.9 10/30/2018   HCT 45.9 10/30/2018   MCV 97.2 10/30/2018   PLT 183 10/30/2018      Component Value Date/Time   NA 139 10/30/2018 1052   K 4.2 10/30/2018 1052   CL 101 10/30/2018 1052   CO2 28 10/30/2018 1052   GLUCOSE 103 (H) 10/30/2018 1052   BUN 10 10/30/2018 1052   CREATININE 0.77 10/30/2018 1052   CALCIUM 9.3 10/30/2018 1052   PROT 7.0 10/30/2018 1052   ALBUMIN 4.0 10/30/2018 1052   AST 29 10/30/2018 1052   ALT 30 10/30/2018 1052   ALKPHOS 68 10/30/2018 1052   BILITOT 0.6 10/30/2018 1052   GFRNONAA >60 10/30/2018 1052   GFRAA >60 10/30/2018 1052  Lab Results  Component Value Date   CHOL 196 07/25/2018   HDL 38.70 (L) 07/25/2018   LDLCALC 149 (H) 04/10/2016   LDLDIRECT 127.0 07/25/2018   TRIG 311.0 (H) 07/25/2018   CHOLHDL 5 07/25/2018   Lab Results  Component Value Date   HGBA1C 6.8 (H) 07/25/2018   Lab Results   Component Value Date   VITAMINB12 869 09/07/2017   Lab Results  Component Value Date   TSH 1.91 07/25/2018      ASSESSMENT AND PLAN 58 y.o. year old female  has a past medical history of Anxiety state, unspecified, Arthralgia of temporomandibular joint, Conversion disorder, Dementia ([place]), Depressive disorder, not elsewhere classified, Dysphagia, unspecified(787.20), Endometriosis, Esophageal reflux, Fibromyalgia, Headache(784.0), Irritable bowel syndrome, Mitral valve disorders(424.0), Myalgia and myositis, unspecified, Overweight(278.02), Periodic limb movement disorder, Pure hypercholesterolemia, and Unspecified hypothyroidism. here with:  1.  Headaches 2.  Anxiety  She reports she has seen a decrease in her headaches while taking Depakote.  She continues to complain of 3-4 headaches a week.  She is taking Depakote 500 mg twice a day.  Her headaches are prompted by stress and anxiety always.  Following the anxiety, she will develop a visual disturbance, then a headache.  She has a particularly stressful family situation.  She will have to take Xanax to relieve the headache, visual disturbance, and anxiety.  She remains on Wellbutrin.  I have offered her referral to psychiatry or to see a counselor to get a better handle on her anxiety.  She did not think it will be very helpful or change anything.  In regards to her headaches, I will check a Depakote level today.  We may consider increasing the medication.  She is not sure if she wants to increase medication at this time, because she knows her symptoms are brought on by anxiety.  Her weight has remained stable while taking Depakote (she has been concerned about weight gain).  In the past she has tried propanolol.  If she does not wish to continue Depakote, we may consider Topamax. She is not sure about switching to another antidepressant.  She had CMP and CBC 10/30/2018 in the ED for abdominal pain that were unremarkable.  She will follow-up in 6  months or sooner if needed.  I did advise her symptoms worsen or she develops any new symptoms she should let us know.  Her issues of visual disturbance have been present since 1992.  MRI of the brain in 2019 was normal.  I spent 15 minutes with the patient. 50% of this time was spent discussing her plan of care.  Butler Denmark, AGNP-C, DNP 11/07/2018, 12:18 PM [facility] [address], [place], [place] [address] [phone]

## 2018-11-07 NOTE — Progress Notes (Signed)
I have read the note, and I agree with the clinical assessment and plan.  Fotini Lemus K Shatia Sindoni   

## 2018-11-07 NOTE — Patient Instructions (Signed)
1. We will check lab work today 2. Return in 6 months

## 2018-11-08 ENCOUNTER — Ambulatory Visit (INDEPENDENT_AMBULATORY_CARE_PROVIDER_SITE_OTHER): Payer: Medicare Other | Admitting: Podiatry

## 2018-11-08 ENCOUNTER — Other Ambulatory Visit: Payer: Self-pay | Admitting: Podiatry

## 2018-11-08 ENCOUNTER — Ambulatory Visit (INDEPENDENT_AMBULATORY_CARE_PROVIDER_SITE_OTHER): Payer: Medicare Other

## 2018-11-08 ENCOUNTER — Encounter: Payer: Self-pay | Admitting: Podiatry

## 2018-11-08 ENCOUNTER — Other Ambulatory Visit: Payer: Self-pay

## 2018-11-08 VITALS — BP 122/62

## 2018-11-08 DIAGNOSIS — M779 Enthesopathy, unspecified: Secondary | ICD-10-CM

## 2018-11-08 DIAGNOSIS — I70219 Atherosclerosis of native arteries of extremities with intermittent claudication, unspecified extremity: Secondary | ICD-10-CM | POA: Diagnosis not present

## 2018-11-08 DIAGNOSIS — M79672 Pain in left foot: Secondary | ICD-10-CM | POA: Diagnosis not present

## 2018-11-08 DIAGNOSIS — M7751 Other enthesopathy of right foot: Secondary | ICD-10-CM | POA: Diagnosis not present

## 2018-11-08 LAB — VALPROIC ACID LEVEL: Valproic Acid Lvl: 38 ug/mL — ABNORMAL LOW (ref 50–100)

## 2018-11-11 ENCOUNTER — Telehealth: Payer: Self-pay | Admitting: Neurology

## 2018-11-11 NOTE — Telephone Encounter (Signed)
I called the patient and left a message.  Her Depakote level was 37. We have room to increase the medication if she chooses. I asked her to call back to discuss medication adjustment we could increase Depakote to 500 mg in the morning, 1000 mg in the evening. It seems anxiety is an issue for her. She is already taking Zoloft and Wellbutrin, and Xanax. We have tried propranolol in the past.

## 2018-11-12 ENCOUNTER — Telehealth: Payer: Self-pay | Admitting: Neurology

## 2018-11-12 NOTE — Telephone Encounter (Signed)
I tried to call the patient. It went straight to voicemail. I left a message regarding her depakote level. We have room to increase if she desires for headache control.

## 2018-11-13 NOTE — Progress Notes (Signed)
Subjective:   Patient ID: Tasha [name], female   DOB: 58 y.o.   MRN: [id number]   HPI Patient states she traumatized her left foot and she is worried she may have broken something and it is been swollen.  Patient does not currently smoke likes to be active and this is occurred within the last week   Review of Systems  All other systems reviewed and are negative.       Objective:  Physical Exam Vitals signs and nursing note reviewed.  Constitutional:      Appearance: She is well-developed.  Pulmonary:     Effort: Pulmonary effort is normal.  Musculoskeletal: Normal range of motion.  Skin:    General: Skin is warm.  Neurological:     Mental Status: She is alert.     Neurovascular status found to be intact muscle strength found to be adequate range of motion within normal limits with patient noted to have discomfort in the left foot with swelling of the outside of the foot and bruising but no indications of range of motion loss or any other kind of vascular neurological incident     Assessment:  Trauma of the left foot     Plan:  H&P conditions reviewed and today I recommended ice anti-inflammatory support therapy and patient will be seen back for [place] to recheck again in the next 6 weeks or if symptoms were to worsen or any other pathology were to occur  X-ray foot and ankle negative for signs of fracture appears to be soft tissue injury

## 2018-11-13 NOTE — Telephone Encounter (Signed)
I sent mychart message as well.

## 2018-11-13 NOTE — Telephone Encounter (Signed)
I called and LMVM for her to return call re: to depakote level.

## 2018-11-23 MED FILL — LEVOTHYROXINE 125 MCG TABLE: 125 | 30 days supply | Qty: 30 | Fill #5

## 2018-11-23 MED FILL — OMEPRAZOLE 40 MG CPDR: 40 | 30 days supply | Qty: 60 | Fill #1

## 2018-11-28 NOTE — Telephone Encounter (Signed)
Received mychart message back unread.  Placed copy in mail.

## 2018-12-11 ENCOUNTER — Other Ambulatory Visit: Payer: Self-pay | Admitting: Internal Medicine

## 2018-12-11 DIAGNOSIS — E039 Hypothyroidism, unspecified: Secondary | ICD-10-CM

## 2018-12-11 DIAGNOSIS — F418 Other specified anxiety disorders: Secondary | ICD-10-CM

## 2018-12-11 MED FILL — buPROPion HCL ER (XL) 150 M: 150 | 90 days supply | Qty: 90 | Fill #1

## 2018-12-11 MED FILL — ALPRAZolam 1 MG TABS: 1 | 30 days supply | Qty: 90 | Fill #0

## 2018-12-11 MED FILL — SERTRALINE HCL 100 MG TAB: 100 | 90 days supply | Qty: 180 | Fill #0

## 2018-12-11 MED FILL — metFORMIN HCL 1000 MG TABS: 1000 | 90 days supply | Qty: 180 | Fill #1

## 2018-12-11 MED FILL — EZETIMIBE 10 MG TABS: 10 | 90 days supply | Qty: 90 | Fill #1

## 2019-01-10 MED FILL — OMEPRAZOLE 40 MG CPDR: 40 | 30 days supply | Qty: 60 | Fill #2

## 2019-01-10 MED FILL — ALPRAZolam 1 MG TABS: 1 | 30 days supply | Qty: 90 | Fill #1

## 2019-01-13 MED FILL — LEVOTHYROXINE 125 MCG TABLE: 125 | 30 days supply | Qty: 30 | Fill #0

## 2019-01-16 ENCOUNTER — Ambulatory Visit
Admission: RE | Admit: 2019-01-16 | Discharge: 2019-01-16 | Disposition: A | Discharge status: Home / Self Care | Payer: Medicare Other | Source: Ambulatory Visit | Attending: Internal Medicine | Admitting: Internal Medicine

## 2019-01-16 ENCOUNTER — Other Ambulatory Visit: Payer: Self-pay

## 2019-01-16 DIAGNOSIS — E2839 Other primary ovarian failure: Secondary | ICD-10-CM

## 2019-01-16 DIAGNOSIS — Z1231 Encounter for screening mammogram for malignant neoplasm of breast: Secondary | ICD-10-CM

## 2019-01-16 DIAGNOSIS — Z1239 Encounter for other screening for malignant neoplasm of breast: Secondary | ICD-10-CM

## 2019-01-17 LAB — HM MAMMOGRAPHY

## 2019-01-17 LAB — HM DEXA SCAN: HM Dexa Scan: -1.9

## 2019-01-23 ENCOUNTER — Telehealth: Payer: Self-pay | Admitting: Internal Medicine

## 2019-01-23 NOTE — Telephone Encounter (Signed)
She is past due for a follow up. He will need to see her for follow up and to assess the blister(s). I can call if needed.

## 2019-01-23 NOTE — Telephone Encounter (Signed)
Copied from [place] [id number]. Topic: Quick Communication - Rx Refill/Question >> Jan 23, 2019  2:00 PM Tasha [name], [place] A wrote: Medication: Medication for fever blisters (Patient stated that otc recommendations are not working for fever blisters and wants a medication called in.)  Has the patient contacted their pharmacy? Yes (Agent: If no, request that the patient contact the pharmacy for the refill.) (Agent: If yes, when and what did the pharmacy advise?)Contact PCP  Preferred Pharmacy (with phone number or street name): [facility], [place]  Phone:  [phone] Fax:  [phone]     Agent: Please be advised that RX refills may take up to 3 business days. We ask that you follow-up with your pharmacy.

## 2019-01-23 NOTE — Telephone Encounter (Signed)
Appointment has been made

## 2019-01-27 ENCOUNTER — Other Ambulatory Visit: Payer: Self-pay

## 2019-01-27 ENCOUNTER — Ambulatory Visit (INDEPENDENT_AMBULATORY_CARE_PROVIDER_SITE_OTHER): Payer: Medicare Other | Admitting: Internal Medicine

## 2019-01-27 ENCOUNTER — Encounter: Payer: Self-pay | Admitting: Internal Medicine

## 2019-01-27 VITALS — BP 134/60 | HR 64 | Temp 98.3°F | Ht 67.0 in | Wt 163.2 lb

## 2019-01-27 DIAGNOSIS — K635 Polyp of colon: Secondary | ICD-10-CM

## 2019-01-27 DIAGNOSIS — R1319 Other dysphagia: Secondary | ICD-10-CM

## 2019-01-27 DIAGNOSIS — E781 Pure hyperglyceridemia: Secondary | ICD-10-CM | POA: Diagnosis not present

## 2019-01-27 DIAGNOSIS — Z0000 Encounter for general adult medical examination without abnormal findings: Secondary | ICD-10-CM

## 2019-01-27 DIAGNOSIS — K7581 Nonalcoholic steatohepatitis (NASH): Secondary | ICD-10-CM

## 2019-01-27 DIAGNOSIS — R1310 Dysphagia, unspecified: Secondary | ICD-10-CM

## 2019-01-27 DIAGNOSIS — E559 Vitamin D deficiency, unspecified: Secondary | ICD-10-CM | POA: Diagnosis not present

## 2019-01-27 DIAGNOSIS — E785 Hyperlipidemia, unspecified: Secondary | ICD-10-CM | POA: Diagnosis not present

## 2019-01-27 DIAGNOSIS — E039 Hypothyroidism, unspecified: Secondary | ICD-10-CM

## 2019-01-27 DIAGNOSIS — Z23 Encounter for immunization: Secondary | ICD-10-CM | POA: Diagnosis not present

## 2019-01-27 DIAGNOSIS — R0789 Other chest pain: Secondary | ICD-10-CM | POA: Diagnosis not present

## 2019-01-27 DIAGNOSIS — I70219 Atherosclerosis of native arteries of extremities with intermittent claudication, unspecified extremity: Secondary | ICD-10-CM | POA: Diagnosis not present

## 2019-01-27 DIAGNOSIS — E118 Type 2 diabetes mellitus with unspecified complications: Secondary | ICD-10-CM | POA: Diagnosis not present

## 2019-01-27 LAB — CBC WITH DIFFERENTIAL/PLATELET
Basophils Absolute: 0 10*3/uL (ref 0.0–0.1)
Basophils Relative: 0.6 % (ref 0.0–3.0)
Eosinophils Absolute: 0 10*3/uL (ref 0.0–0.7)
Eosinophils Relative: 0.5 % (ref 0.0–5.0)
HCT: 41.7 % (ref 36.0–46.0)
Hemoglobin: 13.7 g/dL (ref 12.0–15.0)
Lymphocytes Relative: 47.1 % — ABNORMAL HIGH (ref 12.0–46.0)
Lymphs Abs: 3.4 10*3/uL (ref 0.7–4.0)
MCHC: 32.9 g/dL (ref 30.0–36.0)
MCV: 95.5 fl (ref 78.0–100.0)
Monocytes Absolute: 0.3 10*3/uL (ref 0.1–1.0)
Monocytes Relative: 4.6 % (ref 3.0–12.0)
Neutro Abs: 3.4 10*3/uL (ref 1.4–7.7)
Neutrophils Relative %: 47.2 % (ref 43.0–77.0)
Platelets: 167 10*3/uL (ref 150.0–400.0)
RBC: 4.37 Mil/uL (ref 3.87–5.11)
RDW: 13.4 % (ref 11.5–15.5)
WBC: 7.3 10*3/uL (ref 4.0–10.5)

## 2019-01-27 LAB — TROPONIN I (HIGH SENSITIVITY): High Sens Troponin I: 3 ng/L (ref 2–17)

## 2019-01-27 LAB — HEMOGLOBIN A1C: Hgb A1c MFr Bld: 6.2 % (ref 4.6–6.5)

## 2019-01-27 LAB — PROTIME-INR
INR: 1 ratio (ref 0.8–1.0)
Prothrombin Time: 11.7 s (ref 9.6–13.1)

## 2019-01-27 LAB — TSH: TSH: 22.46 u[IU]/mL — ABNORMAL HIGH (ref 0.35–4.50)

## 2019-01-27 MED ORDER — VITAMIN D3 1.25 MG (50000 UT) PO CAPS: 1.0000 | ORAL_CAPSULE | ORAL | 0 refills | Status: DC

## 2019-01-27 MED FILL — VITAMIN D3 50,000 UNITS CAP: 1.25 MG | 84 days supply | Qty: 12 | Fill #0

## 2019-01-27 NOTE — Patient Instructions (Signed)

## 2019-01-27 NOTE — Progress Notes (Signed)
Subjective:  Patient ID: Tasha [name], female    DOB: [birth year]  Age: 57 y.o. MRN: [id number]  CC: Annual Exam, Hyperlipidemia, Diabetes, and Hypothyroidism   HPI Tasha [name] presents for a CPX.  She complains of a several week history of chest pain.  She describes it as a shocking sensation in the left precordium and under her sternum with exertion.  She denies DOE, diaphoresis, cough, hemoptysis, dizziness, or lightheadedness.  She complains that food is getting stuck in her esophagus midway down her breastbone.  She denies odynophagia, loss of appetite, weight loss, melena, or bright red blood per rectum.  She complains of chronic diarrhea.  She is frustrated with her current gastroenterologist.  She feels like she is due for a follow-up colonoscopy but they will not see her until she obtains records from [facility].  She tells me she has never had a colonoscopy at [facility] and she does not understand why they will not do her repeat colonoscopy.  She has 2 doses of levothyroxine listed.  She is not sure which one she is taking.  Outpatient Medications Prior to Visit  Medication Sig Dispense Refill  . ALPRAZolam (XANAX) 1 MG tablet TAKE 1 TABLET (1 MG TOTAL) BY MOUTH 3 (THREE) TIMES DAILY AS NEEDED FOR ANXIETY. 90 tablet 3  . aspirin EC 81 MG tablet Take 1 tablet (81 mg total) by mouth daily. 90 tablet 1  . buPROPion (WELLBUTRIN XL) 150 MG 24 hr tablet TAKE 1 TABLET BY MOUTH ONCE A DAY 90 tablet 1  . divalproex (DEPAKOTE) 500 MG DR tablet Take 1 tablet (500 mg total) by mouth 2 (two) times daily. 60 tablet 4  . ezetimibe (ZETIA) 10 MG tablet TAKE 1 TABLET BY MOUTH ONCE DAILY 90 tablet 1  . NARCAN 4 MG/0.1ML LIQD nasal spray kit   2  . omeprazole (PRILOSEC) 40 MG capsule TAKE 2 CAPSULES BY MOUTH ONCE A DAY 180 capsule 1  . sertraline (ZOLOFT) 100 MG tablet TAKE 2 TABLETS (200MG) BY MOUTH ONCE A DAY 180 tablet 1  . Cholecalciferol (VITAMIN D3) 1.25 MG (50000 UT)  CAPS Take 1 capsule by mouth once a week. 12 capsule 0  . levothyroxine (SYNTHROID) 125 MCG tablet TAKE 1 TABLET BY MOUTH ONCE A DAY 30 tablet 1  . levothyroxine (SYNTHROID) 137 MCG tablet Take 1 tablet (137 mcg total) by mouth daily before breakfast. 90 tablet 1  . metFORMIN (GLUCOPHAGE) 1000 MG tablet TAKE 1 TABLET BY MOUTH TWICE DAILY WITH MEALS 180 tablet 1  . omega-3 acid ethyl esters (LOVAZA) 1 g capsule Take 2 capsules (2 g total) by mouth 2 (two) times daily. 360 capsule 1  . pravastatin (PRAVACHOL) 10 MG tablet Take 1 tablet (10 mg total) by mouth daily. 90 tablet 1   No facility-administered medications prior to visit.    ROS Review of Systems  Constitutional: Negative.  Negative for diaphoresis, fever and unexpected weight change.  HENT: Positive for trouble swallowing. Negative for sore throat and voice change.   Eyes: Negative.   Respiratory: Negative for cough, chest tightness, shortness of breath and wheezing.   Cardiovascular: Positive for chest pain. Negative for palpitations and leg swelling.  Gastrointestinal: Positive for diarrhea. Negative for blood in stool and nausea.  Endocrine: Negative.   Genitourinary: Negative.   Musculoskeletal: Negative.  Negative for arthralgias and myalgias.  Skin: Negative.  Negative for color change and pallor.  Neurological: Negative.  Negative for dizziness, weakness, light-headedness  and headaches.  Hematological: Negative for adenopathy. Does not bruise/bleed easily.  Psychiatric/Behavioral: Negative.     Objective:  BP 134/60 (BP Location: Left Arm, Patient Position: Sitting, Cuff Size: Large)   Pulse 64   Temp 98.3 F (36.8 C) (Oral)   Ht 5' 7" (1.702 m)   Wt 163 lb 4 oz (74 kg)   SpO2 97%   BMI 25.57 kg/m   BP Readings from Last 3 Encounters:  01/27/19 134/60  11/08/18 122/62  11/07/18 (!) 118/59    Wt Readings from Last 3 Encounters:  01/27/19 163 lb 4 oz (74 kg)  11/07/18 165 lb (74.8 kg)  07/25/18 167 lb (75.8  kg)    Physical Exam Vitals reviewed.  Constitutional:      Appearance: Normal appearance.  HENT:     Nose: Nose normal.     Mouth/Throat:     Mouth: Mucous membranes are moist.  Eyes:     General: No scleral icterus.    Conjunctiva/sclera: Conjunctivae normal.  Cardiovascular:     Rate and Rhythm: Normal rate and regular rhythm.     Heart sounds: No murmur. No friction rub. No gallop.      Comments: EKG -   Sinus bradycardia.  T wave flattening in the lateral leads.  No significant change compared to the prior EKG. Pulmonary:     Effort: Pulmonary effort is normal.     Breath sounds: No stridor. No wheezing, rhonchi or rales.  Chest:     Chest wall: No mass, deformity or tenderness.  Abdominal:     General: Abdomen is protuberant. Bowel sounds are normal. There is no distension.     Palpations: Abdomen is soft. There is no hepatomegaly, splenomegaly or mass.     Tenderness: There is no abdominal tenderness. There is no guarding.  Musculoskeletal:        General: Normal range of motion.     Cervical back: Neck supple.     Right lower leg: No edema.     Left lower leg: No edema.  Lymphadenopathy:     Cervical: No cervical adenopathy.  Skin:    General: Skin is warm and dry.  Neurological:     General: No focal deficit present.     Mental Status: She is alert.  Psychiatric:        Mood and Affect: Mood normal.        Behavior: Behavior normal.     Lab Results  Component Value Date   WBC 7.3 01/27/2019   HGB 13.7 01/27/2019   HCT 41.7 01/27/2019   PLT 167.0 01/27/2019   GLUCOSE 90 01/27/2019   CHOL 207 (H) 01/27/2019   TRIG 314.0 (H) 01/27/2019   HDL 36.90 (L) 01/27/2019   LDLDIRECT 131.0 01/27/2019   LDLCALC 149 (H) 04/10/2016   ALT 18 01/27/2019   AST 17 01/27/2019   NA 139 01/27/2019   K 3.8 01/27/2019   CL 102 01/27/2019   CREATININE 0.88 01/27/2019   BUN 15 01/27/2019   CO2 26 01/27/2019   TSH 22.46 (H) 01/27/2019   INR 1.0 01/27/2019   HGBA1C  6.2 01/27/2019   MICROALBUR 0.9 01/17/2018    DG Bone Density  Result Date: 01/16/2019 EXAM: DUAL X-RAY ABSORPTIOMETRY (DXA) FOR BONE MINERAL DENSITY IMPRESSION: Referring Physician:  [name] Your patient completed a BMD test using Lunar IDXA DXA system ( analysis version: 16 ) manufactured by [employer]. Technologist: KT PATIENT: Name: Tasha [name], Tasha [name] Patient ID: [id number] Birth Date:  [birth year] Height: 66.5 in. Sex: Female Measured: 01/16/2019 Weight: 169.7 lbs. Indications: Bilateral Ovariectomy (65.51), Caucasian, Depakote, Depression, Estrogen Deficient, Family Hist. (Parent hip fracture), History of Fracture (Adult) (V15.51), History of Osteopenia, Hyperthyroid (242.9), Hysterectomy, Levothyroxine, Omeprazole, Postmenopausal, Wellbutrin, Zoloft, Secondary Osteoporosis Fractures: Ankle Treatments: None ASSESSMENT: The BMD measured at AP Spine L1-L4 (L2) is 0.938 g/cm2 with a T-score of -1.9. This patient is considered OSTEOPENIC according to [facility]) criteria. There has been a statistically significant increase/decrease in BMD of left hip since prior exam dated 05/29/2013. L2 was excluded due to exclusion in the past. The scan quality is good. Spine was not compared to prior study due to exclusion of vertebral bodies on current exam. Prior DXA exam performed on Hologic device measures only unilateral hip (not Total Mean Hip). Therefore, current Total Mean Hip  cannot be compared to prior exam. Site Region Measured Date Measured Age YA T-score BMD Significant CHANGE AP Spine  L1-L4 (L2) 01/16/2019    59.8         -1.9    0.938 g/cm2 DualFemur Neck Right 01/16/2019    59.8         -1.5    0.832 g/cm2 DualFemur Total Mean 01/16/2019    59.8         -1.1    0.871 g/cm2 World Health Organization [facility]) criteria for post-menopausal, Caucasian Women: Normal       T-score at or above -1 SD Osteopenia   T-score between -1 and -2.5 SD Osteoporosis T-score at or below -2.5 SD  RECOMMENDATION: 1. All patients should optimize calcium and vitamin D intake. 2. Consider FDA approved medical therapies in postmenopausal women and men aged 65 years and older, based on the following: a. A hip or vertebral (clinical or morphometric) fracture b. T-score < or = -2.5 at the femoral neck or spine after appropriate evaluation to exclude secondary causes c. Low bone mass (T-score between -1.0 and -2.5 at the femoral neck or spine) and a 10 year probability of a hip fracture > or = 3% or a 10 year probability of a major osteoporosis-related fracture > or = 20% based on the [place]-adapted WHO algorithm d. Clinician judgment and/or patient preferences may indicate treatment for people with 10-year fracture probabilities above or below these levels FOLLOW-UP: Patients with diagnosis of osteoporosis or at high risk for fracture should have regular bone mineral density tests. For patients eligible for Medicare, routine testing is allowed once every 2 years. The testing frequency can be increased to one year for patients who have rapidly progressing disease, those who are receiving or discontinuing medical therapy to restore bone mass, or have additional risk factors. I have reviewed this report and agree with the above findings. Mark A. Thornton Papas, M.D. [facility] FRAX* 10-year Probability of Fracture Based on femoral neck BMD: DualFemur (Right) Major Osteoporotic Fracture: 25.9% Hip Fracture:                1.3% Population:                  [place] (Caucasian) Risk Factors: Family Hist. (Parent hip fracture), History of Fracture (Adult) (V15.51), Secondary Osteoporosis *FRAX is a Materials engineer of the [employer] for Metabolic Bone Disease, a World Pharmacologist (WHO) [facility]. ASSESSMENT: The probability of a major osteoporotic fracture is 25.9 % within the next ten years. The probability of a hip fracture is 1.3 % within the next ten years. I have  reviewed this  report and agree with the above findings. Mark A. Thornton Papas, M.D. [facility] Electronically Signed   By: Lavonia Dana M.D.   On: 01/16/2019 17:05   MM Digital Screening  Result Date: 01/17/2019 CLINICAL DATA:  Screening. EXAM: DIGITAL SCREENING BILATERAL MAMMOGRAM WITH CAD COMPARISON:  Previous exam(s). ACR Breast Density Category b: There are scattered areas of fibroglandular density. FINDINGS: There are no findings suspicious for malignancy. Images were processed with CAD. IMPRESSION: No mammographic evidence of malignancy. A result letter of this screening mammogram will be mailed directly to the patient. RECOMMENDATION: Screening mammogram in one year. (Code:SM-B-01Y) BI-RADS CATEGORY  1: Negative. Electronically Signed   By: Franki Cabot M.D.   On: 01/17/2019 10:26    Assessment & Plan:   Tasha [name] was seen today for annual exam, hyperlipidemia, diabetes and hypothyroidism.  Diagnoses and all orders for this visit:  Nonalcoholic steatohepatitis (NASH)- Her liver enzymes are normal now. -     Hepatic function panel -     Protime-INR  Acquired hypothyroidism - Her TSH is elevated at 22.46.  It appears she is not taking her thyroid supplement at all.  I have asked her to restart the 137 mcg dose. -     CBC with Differential -     TSH -     levothyroxine (SYNTHROID) 137 MCG tablet; Take 1 tablet (137 mcg total) by mouth daily before breakfast.  Type II diabetes mellitus with manifestations ([place])- Her A1c is down to 6.2% and she complains of diarrhea.  I recommended that she stop taking Metformin. -     Microalbumin / creatinine urine ratio -     Basic metabolic panel -     Hemoglobin A1c -     Urinalysis, Routine w reflex microscopic -     HM Diabetes Foot Exam  Hyperlipidemia with target LDL less than 130- She has achieved her LDL goal and is doing well on the statin. -     Lipid panel -     Discontinue: pravastatin (PRAVACHOL) 10 MG tablet; Take 1 tablet (10 mg total) by mouth  daily. -     pravastatin (PRAVACHOL) 10 MG tablet; Take 1 tablet (10 mg total) by mouth daily.  Vitamin D deficiency -     Vitamin D 25 hydroxy -     Cholecalciferol (VITAMIN D3) 1.25 MG (50000 UT) CAPS; Take 1 capsule by mouth once a week.  Hyperglyceridemia, pure-her triglycerides remain elevated.  I recommended that she improve her lifestyle modifications. -     Lipid panel -     Discontinue: omega-3 acid ethyl esters (LOVAZA) 1 g capsule; Take 2 capsules (2 g total) by mouth 2 (two) times daily. -     omega-3 acid ethyl esters (LOVAZA) 1 g capsule; Take 2 capsules (2 g total) by mouth 2 (two) times daily.  Routine general medical examination at a health care facility- Exam completed, labs reviewed, vaccines reviewed and updated, she is referred for colon cancer screening, screening for breast cancer and cervical cancer up-to-date, patient education was given.  Need for influenza vaccination -     Flu Vaccine QUAD 36+ mos IM  Polyp of colon, unspecified part of colon, unspecified type -     Ambulatory referral to Gastroenterology  Atypical chest pain- She has atypical chest pain with upper GI symptoms.  I recommended she see GI to see if she needs to undergo upper endoscopy.  Her D-dimer and troponin are negative.  Her EKG  is not suspicious for ischemia.  Will check a chest x-ray to screen for structural lesions.  She will let me know if she develops any new or worsening symptoms. -     D-dimer, quantitative -     Troponin I (High Sensitivity) -     DG Chest 2 View; Future -     EKG 12-Lead  Esophageal dysphagia -     Ambulatory referral to Gastroenterology  Extremity atherosclerosis with intermittent claudication (HCC) -     Discontinue: pravastatin (PRAVACHOL) 10 MG tablet; Take 1 tablet (10 mg total) by mouth daily. -     Discontinue: omega-3 acid ethyl esters (LOVAZA) 1 g capsule; Take 2 capsules (2 g total) by mouth 2 (two) times daily. -     omega-3 acid ethyl esters (LOVAZA)  1 g capsule; Take 2 capsules (2 g total) by mouth 2 (two) times daily. -     pravastatin (PRAVACHOL) 10 MG tablet; Take 1 tablet (10 mg total) by mouth daily.  Other orders -     LDL cholesterol, direct   I have discontinued Cephus Shelling. Mol "Kim"'s metFORMIN. I am also having her maintain her Narcan, aspirin EC, divalproex, omeprazole, ezetimibe, buPROPion, ALPRAZolam, sertraline, Vitamin D3, levothyroxine, omega-3 acid ethyl esters, and pravastatin.  Meds ordered this encounter  Medications  . Cholecalciferol (VITAMIN D3) 1.25 MG (50000 UT) CAPS    Sig: Take 1 capsule by mouth once a week.    Dispense:  12 capsule    Refill:  0  . levothyroxine (SYNTHROID) 137 MCG tablet    Sig: Take 1 tablet (137 mcg total) by mouth daily before breakfast.    Dispense:  90 tablet    Refill:  0  . DISCONTD: pravastatin (PRAVACHOL) 10 MG tablet    Sig: Take 1 tablet (10 mg total) by mouth daily.    Dispense:  90 tablet    Refill:  1  . DISCONTD: omega-3 acid ethyl esters (LOVAZA) 1 g capsule    Sig: Take 2 capsules (2 g total) by mouth 2 (two) times daily.    Dispense:  360 capsule    Refill:  1  . omega-3 acid ethyl esters (LOVAZA) 1 g capsule    Sig: Take 2 capsules (2 g total) by mouth 2 (two) times daily.    Dispense:  360 capsule    Refill:  1  . pravastatin (PRAVACHOL) 10 MG tablet    Sig: Take 1 tablet (10 mg total) by mouth daily.    Dispense:  90 tablet    Refill:  1     Follow-up: Return in about 3 weeks (around 02/17/2019).  [name], MD

## 2019-01-28 ENCOUNTER — Ambulatory Visit (INDEPENDENT_AMBULATORY_CARE_PROVIDER_SITE_OTHER): Payer: Medicare Other

## 2019-01-28 ENCOUNTER — Encounter: Payer: Self-pay | Admitting: Internal Medicine

## 2019-01-28 DIAGNOSIS — R0789 Other chest pain: Secondary | ICD-10-CM | POA: Diagnosis not present

## 2019-01-28 DIAGNOSIS — R079 Chest pain, unspecified: Secondary | ICD-10-CM | POA: Diagnosis not present

## 2019-01-28 LAB — URINALYSIS, ROUTINE W REFLEX MICROSCOPIC
Bilirubin Urine: NEGATIVE
Hgb urine dipstick: NEGATIVE
Ketones, ur: NEGATIVE
Leukocytes,Ua: NEGATIVE
Nitrite: NEGATIVE
RBC / HPF: NONE SEEN (ref 0–?)
Specific Gravity, Urine: 1.02 (ref 1.000–1.030)
Total Protein, Urine: NEGATIVE
Urine Glucose: NEGATIVE
Urobilinogen, UA: 0.2 (ref 0.0–1.0)
pH: 7 (ref 5.0–8.0)

## 2019-01-28 LAB — HEPATIC FUNCTION PANEL
ALT: 18 U/L (ref 0–35)
AST: 17 U/L (ref 0–37)
Albumin: 4.4 g/dL (ref 3.5–5.2)
Alkaline Phosphatase: 71 U/L (ref 39–117)
Bilirubin, Direct: 0 mg/dL (ref 0.0–0.3)
Total Bilirubin: 0.3 mg/dL (ref 0.2–1.2)
Total Protein: 6.8 g/dL (ref 6.0–8.3)

## 2019-01-28 LAB — MICROALBUMIN / CREATININE URINE RATIO
Creatinine,U: 117.9 mg/dL
Microalb Creat Ratio: 0.6 mg/g (ref 0.0–30.0)
Microalb, Ur: <0.7 mg/dL (ref 0.0–1.9)

## 2019-01-28 LAB — LIPID PANEL
Cholesterol: 207 mg/dL — ABNORMAL HIGH (ref 0–200)
HDL: 36.9 mg/dL — ABNORMAL LOW (ref >39.00)
NonHDL: 169.87
Total CHOL/HDL Ratio: 6
Triglycerides: 314 mg/dL — ABNORMAL HIGH (ref 0.0–149.0)
VLDL: 62.8 mg/dL — ABNORMAL HIGH (ref 0.0–40.0)

## 2019-01-28 LAB — VITAMIN D 25 HYDROXY (VIT D DEFICIENCY, FRACTURES): VITD: 35.86 ng/mL (ref 30.00–100.00)

## 2019-01-28 LAB — BASIC METABOLIC PANEL
BUN: 15 mg/dL (ref 6–23)
CO2: 26 mEq/L (ref 19–32)
Calcium: 9.5 mg/dL (ref 8.4–10.5)
Chloride: 102 mEq/L (ref 96–112)
Creatinine, Ser: 0.88 mg/dL (ref 0.40–1.20)
GFR: 65.58 mL/min (ref >60.00)
Glucose, Bld: 90 mg/dL (ref 70–99)
Potassium: 3.8 mEq/L (ref 3.5–5.1)
Sodium: 139 mEq/L (ref 135–145)

## 2019-01-28 LAB — LDL CHOLESTEROL, DIRECT: Direct LDL: 131 mg/dL

## 2019-01-28 LAB — D-DIMER, QUANTITATIVE: D-Dimer, Quant: 0.32 mcg/mL FEU (ref <0.50)

## 2019-01-28 MED ORDER — LEVOTHYROXINE SODIUM 137 MCG PO TABS: 137.0000 ug | ORAL_TABLET | Freq: Every day | ORAL | 0 refills | Status: DC

## 2019-01-28 MED ORDER — PRAVASTATIN SODIUM 10 MG PO TABS: 10.0000 mg | ORAL_TABLET | Freq: Every day | ORAL | 1 refills | Status: DC

## 2019-01-28 MED ORDER — OMEGA-3-ACID ETHYL ESTERS 1 G PO CAPS: 2.0000 g | ORAL_CAPSULE | Freq: Two times a day (BID) | ORAL | 1 refills | Status: DC

## 2019-01-28 MED FILL — PRAVASTATIN SODIUM 10 MG TA: 10 | 90 days supply | Qty: 90 | Fill #0

## 2019-01-28 MED FILL — OMEGA-3 ETHYL ESTERS 1 GM C: 1 | 90 days supply | Qty: 360 | Fill #0

## 2019-02-11 ENCOUNTER — Other Ambulatory Visit: Payer: Self-pay | Admitting: Internal Medicine

## 2019-02-11 DIAGNOSIS — F418 Other specified anxiety disorders: Secondary | ICD-10-CM

## 2019-02-11 MED FILL — ALPRAZolam 1 MG TABS: 1 | 30 days supply | Qty: 90 | Fill #2

## 2019-02-11 MED FILL — OMEPRAZOLE 40 MG CPDR: 40 | 30 days supply | Qty: 60 | Fill #3

## 2019-02-11 MED FILL — LEVOTHYROXINE 125 MCG TABLE: 125 | 30 days supply | Qty: 30 | Fill #1

## 2019-02-17 ENCOUNTER — Ambulatory Visit: Payer: Medicare Other | Admitting: Internal Medicine

## 2019-02-17 MED FILL — buPROPion HCL ER (XL) 150 M: 150 | 30 days supply | Qty: 30 | Fill #0

## 2019-03-06 ENCOUNTER — Ambulatory Visit: Payer: Medicare Other | Admitting: Internal Medicine

## 2019-03-15 ENCOUNTER — Other Ambulatory Visit: Payer: Self-pay | Admitting: Internal Medicine

## 2019-03-15 DIAGNOSIS — E039 Hypothyroidism, unspecified: Secondary | ICD-10-CM

## 2019-03-15 DIAGNOSIS — E785 Hyperlipidemia, unspecified: Secondary | ICD-10-CM

## 2019-03-15 DIAGNOSIS — K219 Gastro-esophageal reflux disease without esophagitis: Secondary | ICD-10-CM

## 2019-03-15 DIAGNOSIS — E118 Type 2 diabetes mellitus with unspecified complications: Secondary | ICD-10-CM

## 2019-03-15 MED ORDER — OMEPRAZOLE 40 MG PO CPDR: DELAYED_RELEASE_CAPSULE | ORAL | 1 refills | Status: DC

## 2019-03-15 MED ORDER — LEVOTHYROXINE SODIUM 137 MCG PO TABS: 137.0000 ug | ORAL_TABLET | Freq: Every day | ORAL | 0 refills | Status: DC

## 2019-03-15 MED ORDER — EZETIMIBE 10 MG PO TABS: 10.0000 mg | ORAL_TABLET | Freq: Every day | ORAL | 1 refills | Status: DC

## 2019-03-15 MED FILL — buPROPion HCL ER (XL) 150 M: 150 | 30 days supply | Qty: 30 | Fill #0

## 2019-03-15 MED FILL — ALPRAZolam 1 MG TABS: 1 | 30 days supply | Qty: 90 | Fill #3

## 2019-03-17 ENCOUNTER — Other Ambulatory Visit: Payer: Self-pay | Admitting: Internal Medicine

## 2019-03-17 DIAGNOSIS — E118 Type 2 diabetes mellitus with unspecified complications: Secondary | ICD-10-CM

## 2019-03-17 MED FILL — LEVOTHYROXINE 137 MCG TABLE: 137 | 90 days supply | Qty: 90 | Fill #0

## 2019-03-17 MED FILL — SERTRALINE HCL 100 MG TAB: 100 | 90 days supply | Qty: 180 | Fill #1

## 2019-03-17 MED FILL — EZETIMIBE 10 MG TABS: 10 | 90 days supply | Qty: 90 | Fill #0

## 2019-03-17 MED FILL — OMEPRAZOLE 40 MG CPDR: 40 | 90 days supply | Qty: 180 | Fill #0

## 2019-03-26 ENCOUNTER — Other Ambulatory Visit: Payer: Self-pay | Admitting: Internal Medicine

## 2019-03-26 DIAGNOSIS — E118 Type 2 diabetes mellitus with unspecified complications: Secondary | ICD-10-CM

## 2019-04-10 ENCOUNTER — Encounter: Payer: Self-pay | Admitting: Internal Medicine

## 2019-04-10 ENCOUNTER — Ambulatory Visit (INDEPENDENT_AMBULATORY_CARE_PROVIDER_SITE_OTHER): Payer: Medicare Other | Admitting: Internal Medicine

## 2019-04-10 ENCOUNTER — Other Ambulatory Visit: Payer: Self-pay

## 2019-04-10 VITALS — BP 134/74 | HR 68 | Temp 98.6°F | Ht 67.0 in | Wt 167.0 lb

## 2019-04-10 DIAGNOSIS — F418 Other specified anxiety disorders: Secondary | ICD-10-CM

## 2019-04-10 DIAGNOSIS — E039 Hypothyroidism, unspecified: Secondary | ICD-10-CM | POA: Diagnosis not present

## 2019-04-10 DIAGNOSIS — K7581 Nonalcoholic steatohepatitis (NASH): Secondary | ICD-10-CM

## 2019-04-10 DIAGNOSIS — R002 Palpitations: Secondary | ICD-10-CM | POA: Diagnosis not present

## 2019-04-10 DIAGNOSIS — K635 Polyp of colon: Secondary | ICD-10-CM

## 2019-04-10 DIAGNOSIS — E785 Hyperlipidemia, unspecified: Secondary | ICD-10-CM

## 2019-04-10 DIAGNOSIS — E118 Type 2 diabetes mellitus with unspecified complications: Secondary | ICD-10-CM

## 2019-04-10 DIAGNOSIS — K219 Gastro-esophageal reflux disease without esophagitis: Secondary | ICD-10-CM

## 2019-04-10 DIAGNOSIS — E781 Pure hyperglyceridemia: Secondary | ICD-10-CM

## 2019-04-10 DIAGNOSIS — I70219 Atherosclerosis of native arteries of extremities with intermittent claudication, unspecified extremity: Secondary | ICD-10-CM

## 2019-04-10 DIAGNOSIS — E559 Vitamin D deficiency, unspecified: Secondary | ICD-10-CM | POA: Diagnosis not present

## 2019-04-10 DIAGNOSIS — Z124 Encounter for screening for malignant neoplasm of cervix: Secondary | ICD-10-CM

## 2019-04-10 LAB — BASIC METABOLIC PANEL
BUN: 14 mg/dL (ref 6–23)
CO2: 29 mEq/L (ref 19–32)
Calcium: 9.9 mg/dL (ref 8.4–10.5)
Chloride: 101 mEq/L (ref 96–112)
Creatinine, Ser: 0.96 mg/dL (ref 0.40–1.20)
GFR: 59.28 mL/min — ABNORMAL LOW (ref >60.00)
Glucose, Bld: 212 mg/dL — ABNORMAL HIGH (ref 70–99)
Potassium: 4 mEq/L (ref 3.5–5.1)
Sodium: 136 mEq/L (ref 135–145)

## 2019-04-10 LAB — VITAMIN D 25 HYDROXY (VIT D DEFICIENCY, FRACTURES): VITD: 38.88 ng/mL (ref 30.00–100.00)

## 2019-04-10 LAB — TSH: TSH: 17.16 u[IU]/mL — ABNORMAL HIGH (ref 0.35–4.50)

## 2019-04-10 LAB — HEMOGLOBIN A1C: Hgb A1c MFr Bld: 6.8 % — ABNORMAL HIGH (ref 4.6–6.5)

## 2019-04-10 LAB — TRIGLYCERIDES: Triglycerides: 286 mg/dL — ABNORMAL HIGH (ref 0.0–149.0)

## 2019-04-10 MED ORDER — SERTRALINE HCL 100 MG PO TABS: ORAL_TABLET | ORAL | 1 refills | Status: DC

## 2019-04-10 MED ORDER — BUPROPION HCL ER (XL) 150 MG PO TB24: 150.0000 mg | ORAL_TABLET | Freq: Every day | ORAL | 1 refills | Status: DC

## 2019-04-10 MED ORDER — PRAVASTATIN SODIUM 10 MG PO TABS: 10.0000 mg | ORAL_TABLET | Freq: Every day | ORAL | 1 refills | Status: DC

## 2019-04-10 MED ORDER — OMEPRAZOLE 40 MG PO CPDR: DELAYED_RELEASE_CAPSULE | ORAL | 1 refills | Status: DC

## 2019-04-10 MED ORDER — EZETIMIBE 10 MG PO TABS: 10.0000 mg | ORAL_TABLET | Freq: Every day | ORAL | 1 refills | Status: DC

## 2019-04-10 MED ORDER — ALPRAZOLAM 1 MG PO TABS: 1.0000 mg | ORAL_TABLET | Freq: Three times a day (TID) | ORAL | 3 refills | Status: DC | PRN

## 2019-04-10 MED ORDER — VITAMIN D3 1.25 MG (50000 UT) PO CAPS: 1.0000 | ORAL_CAPSULE | ORAL | 0 refills | Status: DC

## 2019-04-10 MED ORDER — OMEGA-3-ACID ETHYL ESTERS 1 G PO CAPS: 2.0000 g | ORAL_CAPSULE | Freq: Two times a day (BID) | ORAL | 1 refills | Status: DC

## 2019-04-10 MED FILL — OMEGA-3 ETHYL ESTERS 1 GM C: 1 | 90 days supply | Qty: 360 | Fill #0

## 2019-04-10 MED FILL — PRAVASTATIN SODIUM 10 MG TA: 10 | 90 days supply | Qty: 90 | Fill #0

## 2019-04-10 MED FILL — VITAMIN D3 50,000 UNITS CAP: 1.25 MG | 84 days supply | Qty: 12 | Fill #0

## 2019-04-10 MED FILL — buPROPion HCL ER (XL) 150 M: 150 | 90 days supply | Qty: 90 | Fill #0

## 2019-04-10 NOTE — Patient Instructions (Signed)
Palpitations Palpitations are feelings that your heartbeat is irregular or is faster than normal. It may feel like your heart is fluttering or skipping a beat. Palpitations are usually not a serious problem. They may be caused by many things, including smoking, caffeine, alcohol, stress, and certain medicines or drugs. Most causes of palpitations are not serious. However, some palpitations can be a sign of a serious problem. You may need further tests to rule out serious medical problems. Follow these instructions at home:     Pay attention to any changes in your condition. Take these actions to help manage your symptoms: Eating and drinking  Avoid foods and drinks that may cause palpitations. These may include: ? Caffeinated coffee, tea, soft drinks, diet pills, and energy drinks. ? Chocolate. ? Alcohol. Lifestyle  Take steps to reduce your stress and anxiety. Things that can help you relax include: ? Yoga. ? Mind-body activities, such as deep breathing, meditation, or using words and images to create positive thoughts (guided imagery). ? Physical activity, such as swimming, jogging, or walking. Tell your health care provider if your palpitations increase with activity. If you have chest pain or shortness of breath with activity, do not continue the activity until you are seen by your health care provider. ? Biofeedback. This is a method that helps you learn to use your mind to control things in your body, such as your heartbeat.  Do not use drugs, including cocaine or ecstasy. Do not use marijuana.  Get plenty of rest and sleep. Keep a regular bed time. General instructions  Take over-the-counter and prescription medicines only as told by your health care provider.  Do not use any products that contain nicotine or tobacco, such as cigarettes and e-cigarettes. If you need help quitting, ask your health care provider.  Keep all follow-up visits as told by your health care provider. This  is important. These may include visits for further testing if palpitations do not go away or get worse. Contact a health care provider if you:  Continue to have a fast or irregular heartbeat after 24 hours.  Notice that your palpitations occur more often. Get help right away if you:  Have chest pain or shortness of breath.  Have a severe headache.  Feel dizzy or you faint. Summary  Palpitations are feelings that your heartbeat is irregular or is faster than normal. It may feel like your heart is fluttering or skipping a beat.  Palpitations may be caused by many things, including smoking, caffeine, alcohol, stress, certain medicines, and drugs.  Although most causes of palpitations are not serious, some causes can be a sign of a serious medical problem.  Get help right away if you faint or have chest pain, shortness of breath, a severe headache, or dizziness. This information is not intended to replace advice given to you by your health care provider. Make sure you discuss any questions you have with your health care provider. Document Revised: 03/07/2017 Document Reviewed: 03/07/2017 Elsevier Patient Education  2020 [employer].

## 2019-04-10 NOTE — Progress Notes (Signed)
Subjective:  Patient ID: Tasha [name], female    DOB: [birth year]  Age: 59 y.o. MRN: [id number]  CC: Hypothyroidism, Hyperlipidemia, and Diabetes  This visit occurred during the SARS-CoV-2 public health emergency.  Safety protocols were in place, including screening questions prior to the visit, additional usage of staff PPE, and extensive cleaning of exam room while observing appropriate contact time as indicated for disinfecting solutions.    HPI Tasha [name] presents for f/up - She complains of fatigue, weight gain, and persistent palpitations.  She describes irregular heartbeats when she is trying to sleep.  She denies chest pain, dyspnea on exertion, edema, dizziness, lightheadedness, or near syncope.  She tells me she quit taking Metformin and no longer has diarrhea.  Outpatient Medications Prior to Visit  Medication Sig Dispense Refill  . aspirin EC 81 MG tablet Take 1 tablet (81 mg total) by mouth daily. 90 tablet 1  . divalproex (DEPAKOTE) 500 MG DR tablet Take 1 tablet (500 mg total) by mouth 2 (two) times daily. 60 tablet 4  . NARCAN 4 MG/0.1ML LIQD nasal spray kit   2  . ALPRAZolam (XANAX) 1 MG tablet TAKE 1 TABLET (1 MG TOTAL) BY MOUTH 3 (THREE) TIMES DAILY AS NEEDED FOR ANXIETY. 90 tablet 3  . buPROPion (WELLBUTRIN XL) 150 MG 24 hr tablet TAKE 1 TABLET BY MOUTH ONCE A DAY 90 tablet 1  . Cholecalciferol (VITAMIN D3) 1.25 MG (50000 UT) CAPS Take 1 capsule by mouth once a week. 12 capsule 0  . ezetimibe (ZETIA) 10 MG tablet Take 1 tablet (10 mg total) by mouth daily. 90 tablet 1  . levothyroxine (SYNTHROID) 137 MCG tablet Take 1 tablet (137 mcg total) by mouth daily before breakfast. 90 tablet 0  . omega-3 acid ethyl esters (LOVAZA) 1 g capsule Take 2 capsules (2 g total) by mouth 2 (two) times daily. 360 capsule 1  . omeprazole (PRILOSEC) 40 MG capsule TAKE 2 CAPSULES BY MOUTH ONCE A DAY 180 capsule 1  . pravastatin (PRAVACHOL) 10 MG tablet Take 1 tablet (10 mg  total) by mouth daily. 90 tablet 1  . sertraline (ZOLOFT) 100 MG tablet TAKE 2 TABLETS (200MG) BY MOUTH ONCE A DAY 180 tablet 1   No facility-administered medications prior to visit.    ROS Review of Systems  Constitutional: Positive for fatigue and unexpected weight change (wt gain). Negative for diaphoresis.  HENT: Negative.   Eyes: Negative.  Negative for visual disturbance.  Respiratory: Negative for cough, chest tightness, shortness of breath and wheezing.   Cardiovascular: Positive for palpitations. Negative for chest pain and leg swelling.  Gastrointestinal: Negative for abdominal pain, constipation, diarrhea, nausea and vomiting.  Endocrine: Negative.  Negative for cold intolerance and heat intolerance.  Genitourinary: Negative.  Negative for difficulty urinating.  Musculoskeletal: Negative.  Negative for arthralgias and myalgias.  Skin: Negative.  Negative for color change, pallor and rash.  Neurological: Negative.  Negative for dizziness, weakness, light-headedness and headaches.  Hematological: Negative for adenopathy. Does not bruise/bleed easily.  Psychiatric/Behavioral: Negative for behavioral problems, decreased concentration, dysphoric mood, sleep disturbance and suicidal ideas. The patient is nervous/anxious.     Objective:  BP 134/74 (BP Location: Left Arm, Patient Position: Sitting, Cuff Size: Normal)   Pulse 68   Temp 98.6 F (37 C) (Oral)   Ht 5' 7"  (1.702 m)   Wt 167 lb (75.8 kg)   SpO2 95%   BMI 26.16 kg/m   BP Readings from Last 3 Encounters:  04/10/19 134/74  01/27/19 134/60  11/08/18 122/62    Wt Readings from Last 3 Encounters:  04/10/19 167 lb (75.8 kg)  01/27/19 163 lb 4 oz (74 kg)  11/07/18 165 lb (74.8 kg)    Physical Exam Vitals reviewed.  Constitutional:      Appearance: Normal appearance.  HENT:     Nose: Nose normal.     Mouth/Throat:     Mouth: Mucous membranes are moist.  Eyes:     General: No scleral icterus.     Conjunctiva/sclera: Conjunctivae normal.  Cardiovascular:     Rate and Rhythm: Normal rate and regular rhythm.     Heart sounds: No murmur.  Pulmonary:     Effort: Pulmonary effort is normal.     Breath sounds: No stridor. No wheezing, rhonchi or rales.  Abdominal:     General: Abdomen is protuberant. Bowel sounds are normal. There is no distension.     Palpations: Abdomen is soft. There is no hepatomegaly, splenomegaly or mass.     Tenderness: There is no abdominal tenderness. There is no guarding.  Musculoskeletal:        General: Normal range of motion.     Cervical back: Neck supple.     Right lower leg: No edema.     Left lower leg: No edema.  Lymphadenopathy:     Cervical: No cervical adenopathy.  Skin:    General: Skin is warm and dry.     Coloration: Skin is not pale.  Neurological:     General: No focal deficit present.     Mental Status: She is alert.  Psychiatric:        Mood and Affect: Mood normal.        Behavior: Behavior normal.     Lab Results  Component Value Date   WBC 7.3 01/27/2019   HGB 13.7 01/27/2019   HCT 41.7 01/27/2019   PLT 167.0 01/27/2019   GLUCOSE 212 (H) 04/10/2019   CHOL 207 (H) 01/27/2019   TRIG 286.0 (H) 04/10/2019   HDL 36.90 (L) 01/27/2019   LDLDIRECT 131.0 01/27/2019   LDLCALC 149 (H) 04/10/2016   ALT 18 01/27/2019   AST 17 01/27/2019   NA 136 04/10/2019   K 4.0 04/10/2019   CL 101 04/10/2019   CREATININE 0.96 04/10/2019   BUN 14 04/10/2019   CO2 29 04/10/2019   TSH 17.16 (H) 04/10/2019   INR 1.0 01/27/2019   HGBA1C 6.8 (H) 04/10/2019   MICROALBUR <0.7 01/27/2019    DG Bone Density  Result Date: 01/16/2019 EXAM: DUAL X-RAY ABSORPTIOMETRY (DXA) FOR BONE MINERAL DENSITY IMPRESSION: Referring Physician:  [name] Your patient completed a BMD test using Lunar IDXA DXA system ( analysis version: 16 ) manufactured by [employer]. Technologist: KT PATIENT: Name: Tasha [name], Tasha [name] Patient ID: [id number] Birth Date:  [birth year] Height: 66.5 in. Sex: Female Measured: 01/16/2019 Weight: 169.7 lbs. Indications: Bilateral Ovariectomy (65.51), Caucasian, Depakote, Depression, Estrogen Deficient, Family Hist. (Parent hip fracture), History of Fracture (Adult) (V15.51), History of Osteopenia, Hyperthyroid (242.9), Hysterectomy, Levothyroxine, Omeprazole, Postmenopausal, Wellbutrin, Zoloft, Secondary Osteoporosis Fractures: Ankle Treatments: None ASSESSMENT: The BMD measured at AP Spine L1-L4 (L2) is 0.938 g/cm2 with a T-score of -1.9. This patient is considered OSTEOPENIC according to [facility]) criteria. There has been a statistically significant increase/decrease in BMD of left hip since prior exam dated 05/29/2013. L2 was excluded due to exclusion in the past. The scan quality is good. Spine was not compared to prior study  due to exclusion of vertebral bodies on current exam. Prior DXA exam performed on Hologic device measures only unilateral hip (not Total Mean Hip). Therefore, current Total Mean Hip  cannot be compared to prior exam. Site Region Measured Date Measured Age YA T-score BMD Significant CHANGE AP Spine  L1-L4 (L2) 01/16/2019    59.8         -1.9    0.938 g/cm2 DualFemur Neck Right 01/16/2019    59.8         -1.4    0.832 g/cm2 DualFemur Total Mean 01/16/2019    59.8         -1.1    0.871 g/cm2 World Health Organization [facility]) criteria for post-menopausal, Caucasian Women: Normal       T-score at or above -1 SD Osteopenia   T-score between -1 and -2.5 SD Osteoporosis T-score at or below -2.5 SD RECOMMENDATION: 1. All patients should optimize calcium and vitamin [name] intake. 2. Consider FDA approved medical therapies in postmenopausal women and men aged 22 years and older, based on the following: a. A hip or vertebral (clinical or morphometric) fracture b. T-score < or = -2.5 at the femoral neck or spine after appropriate evaluation to exclude secondary causes c. Low bone mass (T-score between -1.0 and  -2.5 at the femoral neck or spine) and a 10 year probability of a hip fracture > or = 3% or a 10 year probability of a major osteoporosis-related fracture > or = 20% based on the [place]-adapted WHO algorithm [name]. Clinician judgment and/or patient preferences may indicate treatment for people with 10-year fracture probabilities above or below these levels FOLLOW-UP: Patients with diagnosis of osteoporosis or at high risk for fracture should have regular bone mineral density tests. For patients eligible for Medicare, routine testing is allowed once every 2 years. The testing frequency can be increased to one year for patients who have rapidly progressing disease, those who are receiving or discontinuing medical therapy to restore bone mass, or have additional risk factors. I have reviewed this report and agree with the above findings. Mark A. Thornton Papas, M.[name]. [facility] FRAX* 10-year Probability of Fracture Based on femoral neck BMD: DualFemur (Right) Major Osteoporotic Fracture: 25.9% Hip Fracture:                1.3% Population:                  [place] (Caucasian) Risk Factors: Family Hist. (Parent hip fracture), History of Fracture (Adult) (V15.51), Secondary Osteoporosis *FRAX is a Materials engineer of the [employer] for Metabolic Bone Disease, a World Pharmacologist (WHO) [facility]. ASSESSMENT: The probability of a major osteoporotic fracture is 25.9 % within the next ten years. The probability of a hip fracture is 1.3 % within the next ten years. I have reviewed this report and agree with the above findings. Mark A. Thornton Papas, M.[name]. [facility] Electronically Signed   By: Lavonia Dana M.[name].   On: 01/16/2019 17:05   MM Digital Screening  Result Date: 01/17/2019 CLINICAL DATA:  Screening. EXAM: DIGITAL SCREENING BILATERAL MAMMOGRAM WITH CAD COMPARISON:  Previous exam(s). ACR Breast Density Category b: There are scattered areas of fibroglandular density. FINDINGS:  There are no findings suspicious for malignancy. Images were processed with CAD. IMPRESSION: No mammographic evidence of malignancy. A result letter of this screening mammogram will be mailed directly to the patient. RECOMMENDATION: Screening mammogram in one year. (Code:SM-B-01Y) BI-RADS CATEGORY  1: Negative. Electronically Signed  By: Franki Cabot M.[name].   On: 01/17/2019 10:26    Assessment & Plan:   Tasha [name] was seen today for hypothyroidism, hyperlipidemia and diabetes.  Diagnoses and all orders for this visit:  Nonalcoholic steatohepatitis (NASH)- Her liver enzymes are normal now.  Acquired hypothyroidism- Her TSH is elevated and she is symptomatic.  I recommended that she increase her levothyroxine dose to 175 mcg a day. -     TSH -     levothyroxine (SYNTHROID) 175 MCG tablet; Take 1 tablet (175 mcg total) by mouth daily before breakfast.  Type II diabetes mellitus with manifestations ([place])- Her A1c is at 6.8%.  Medical therapy is not indicated. -     Basic metabolic panel -     Hemoglobin A1c  Hyperglyceridemia, pure- Her triglycerides remain elevated.  I recommended that she improve her lifestyle modifications and to be compliant with the omega-3 fish oil. -     Triglycerides -     omega-3 acid ethyl esters (LOVAZA) 1 g capsule; Take 2 capsules (2 g total) by mouth 2 (two) times daily.  Vitamin [name] deficiency -     VITAMIN [name] 25 Hydroxy (Vit-[name] Deficiency, Fractures) -     Cholecalciferol (VITAMIN D3) 1.25 MG (50000 UT) CAPS; Take 1 capsule by mouth once a week.  Depression with anxiety -     ALPRAZolam (XANAX) 1 MG tablet; Take 1 tablet (1 mg total) by mouth 3 (three) times daily as needed for anxiety. -     buPROPion (WELLBUTRIN XL) 150 MG 24 hr tablet; Take 1 tablet (150 mg total) by mouth daily. -     sertraline (ZOLOFT) 100 MG tablet; TAKE 2 TABLETS (200MG) BY MOUTH ONCE A DAY  Hyperlipidemia with target LDL less than 130 -     ezetimibe (ZETIA) 10 MG tablet; Take 1 tablet  (10 mg total) by mouth daily. -     pravastatin (PRAVACHOL) 10 MG tablet; Take 1 tablet (10 mg total) by mouth daily.  Extremity atherosclerosis with intermittent claudication (HCC) -     pravastatin (PRAVACHOL) 10 MG tablet; Take 1 tablet (10 mg total) by mouth daily.  Gastroesophageal reflux disease without esophagitis -     omeprazole (PRILOSEC) 40 MG capsule; TAKE 2 CAPSULES BY MOUTH ONCE A DAY  Cervical cancer screening -     Ambulatory referral to Gynecology  Polyp of colon, unspecified part of colon, unspecified type -     Ambulatory referral to Gastroenterology  Intermittent palpitations- I have asked her to undergo an event monitor to try to identify what is causing her palpitations. -     Cancel: CARDIAC EVENT MONITOR; Future -     CARDIAC EVENT MONITOR; Future   I have discontinued Tasha Shelling. Edelen "Kim"'s levothyroxine. I have also changed her buPROPion. Additionally, I am having her start on levothyroxine. Lastly, I am having her maintain her Narcan, aspirin EC, divalproex, ALPRAZolam, Vitamin D3, ezetimibe, omega-3 acid ethyl esters, omeprazole, pravastatin, and sertraline.  Meds ordered this encounter  Medications  . ALPRAZolam (XANAX) 1 MG tablet    Sig: Take 1 tablet (1 mg total) by mouth 3 (three) times daily as needed for anxiety.    Dispense:  90 tablet    Refill:  3  . buPROPion (WELLBUTRIN XL) 150 MG 24 hr tablet    Sig: Take 1 tablet (150 mg total) by mouth daily.    Dispense:  90 tablet    Refill:  1  . Cholecalciferol (VITAMIN D3) 1.25  MG (50000 UT) CAPS    Sig: Take 1 capsule by mouth once a week.    Dispense:  12 capsule    Refill:  0  . ezetimibe (ZETIA) 10 MG tablet    Sig: Take 1 tablet (10 mg total) by mouth daily.    Dispense:  90 tablet    Refill:  1  . omega-3 acid ethyl esters (LOVAZA) 1 g capsule    Sig: Take 2 capsules (2 g total) by mouth 2 (two) times daily.    Dispense:  360 capsule    Refill:  1  . omeprazole (PRILOSEC) 40 MG  capsule    Sig: TAKE 2 CAPSULES BY MOUTH ONCE A DAY    Dispense:  180 capsule    Refill:  1  . pravastatin (PRAVACHOL) 10 MG tablet    Sig: Take 1 tablet (10 mg total) by mouth daily.    Dispense:  90 tablet    Refill:  1  . sertraline (ZOLOFT) 100 MG tablet    Sig: TAKE 2 TABLETS (200MG) BY MOUTH ONCE A DAY    Dispense:  180 tablet    Refill:  1  . levothyroxine (SYNTHROID) 175 MCG tablet    Sig: Take 1 tablet (175 mcg total) by mouth daily before breakfast.    Dispense:  90 tablet    Refill:  0     Follow-up: Return in about 3 months (around 07/11/2019).  [name], MD

## 2019-04-11 ENCOUNTER — Encounter: Payer: Self-pay | Admitting: Internal Medicine

## 2019-04-11 MED ORDER — LEVOTHYROXINE SODIUM 175 MCG PO TABS: 175.0000 ug | ORAL_TABLET | Freq: Every day | ORAL | 0 refills | Status: DC

## 2019-04-11 MED FILL — LEVOTHYROXINE 175 MCG TAB: 175 | 90 days supply | Qty: 90 | Fill #0

## 2019-04-15 MED FILL — ALPRAZolam 1 MG TABS: 1 | 30 days supply | Qty: 90 | Fill #0

## 2019-04-16 ENCOUNTER — Other Ambulatory Visit: Payer: Self-pay

## 2019-04-16 ENCOUNTER — Ambulatory Visit (AMBULATORY_SURGERY_CENTER): Payer: Self-pay | Admitting: *Deleted

## 2019-04-16 VITALS — Temp 96.6°F | Ht 67.0 in | Wt 168.0 lb

## 2019-04-16 DIAGNOSIS — Z1211 Encounter for screening for malignant neoplasm of colon: Secondary | ICD-10-CM

## 2019-04-16 NOTE — Progress Notes (Signed)
No egg or soy allergy known to patient  No issues with past sedation with any surgeries  or procedures, no intubation problems  No diet pills per patient No home 02 use per patient  No blood thinners per patient  Pt denies issues with constipation - she complained of diarrhea on metformin - pcp stopped metformin with improvement  No A fib or A flutter  EMMI video sent to pt's e mail   During PV pt stated she wants to have an EGD- she has indigestion, she has pain in her shoulder, she is hoarse in the am's and through out the day- she is having issues swallowing-  She saw a GI at [facility] in 2012 and then saw ENT 2016 at [facility] who in the notes states LPR with hoarseness- notes from both in care every where   Due to increased concerns  by pt, canceled 3-22 colon and made an OV- pt wishes  to have EGD/ Colon at the same time if needed   I canceled 3-22 Colon for screening and made her an OV with Dr Carlean Purl 4-7 at 130 pm- to arrive 115 pm 3rd floor- printed her out an AVS to have appt date and time and asked her to call with any questions or concerns

## 2019-04-18 ENCOUNTER — Other Ambulatory Visit: Payer: Self-pay | Admitting: Internal Medicine

## 2019-04-18 ENCOUNTER — Encounter: Payer: Self-pay | Admitting: Internal Medicine

## 2019-04-18 DIAGNOSIS — E781 Pure hyperglyceridemia: Secondary | ICD-10-CM

## 2019-04-18 DIAGNOSIS — F418 Other specified anxiety disorders: Secondary | ICD-10-CM

## 2019-04-18 DIAGNOSIS — K219 Gastro-esophageal reflux disease without esophagitis: Secondary | ICD-10-CM

## 2019-04-18 DIAGNOSIS — I70219 Atherosclerosis of native arteries of extremities with intermittent claudication, unspecified extremity: Secondary | ICD-10-CM

## 2019-04-18 DIAGNOSIS — E559 Vitamin D deficiency, unspecified: Secondary | ICD-10-CM

## 2019-04-18 DIAGNOSIS — E785 Hyperlipidemia, unspecified: Secondary | ICD-10-CM

## 2019-04-18 MED ORDER — PRAVASTATIN SODIUM 10 MG PO TABS: 10.0000 mg | ORAL_TABLET | Freq: Every day | ORAL | 1 refills | Status: DC

## 2019-04-18 MED ORDER — OMEPRAZOLE 40 MG PO CPDR: DELAYED_RELEASE_CAPSULE | ORAL | 1 refills | Status: DC

## 2019-04-18 MED ORDER — BUPROPION HCL ER (XL) 150 MG PO TB24: 150.0000 mg | ORAL_TABLET | Freq: Every day | ORAL | 1 refills | Status: DC

## 2019-04-18 MED ORDER — EZETIMIBE 10 MG PO TABS: 10.0000 mg | ORAL_TABLET | Freq: Every day | ORAL | 1 refills | Status: DC

## 2019-04-18 MED ORDER — SERTRALINE HCL 100 MG PO TABS: ORAL_TABLET | ORAL | 1 refills | Status: DC

## 2019-04-18 MED ORDER — VITAMIN D3 1.25 MG (50000 UT) PO CAPS: 1.0000 | ORAL_CAPSULE | ORAL | 0 refills | Status: DC

## 2019-04-18 MED ORDER — OMEGA-3-ACID ETHYL ESTERS 1 G PO CAPS: 2.0000 g | ORAL_CAPSULE | Freq: Two times a day (BID) | ORAL | 1 refills | Status: DC

## 2019-04-21 ENCOUNTER — Encounter: Payer: Self-pay | Admitting: *Deleted

## 2019-04-21 ENCOUNTER — Encounter: Payer: Self-pay | Admitting: Internal Medicine

## 2019-04-21 NOTE — Progress Notes (Signed)
Patient ID: Tasha [name], female   DOB: [birth year], 59 y.o.   MRN: [id number] Patient enrolled for [employer] to ship a 30 day cardiac event monitor to her home.  Instructions sent to patient via My Chart Message and will be included in the monitor kit.

## 2019-04-22 ENCOUNTER — Encounter: Payer: Self-pay | Admitting: Internal Medicine

## 2019-04-23 ENCOUNTER — Other Ambulatory Visit: Payer: Self-pay | Admitting: Internal Medicine

## 2019-04-28 ENCOUNTER — Encounter: Payer: Medicare Other | Admitting: Internal Medicine

## 2019-04-29 ENCOUNTER — Encounter: Payer: Self-pay | Admitting: Interventional Cardiology

## 2019-04-29 ENCOUNTER — Ambulatory Visit (INDEPENDENT_AMBULATORY_CARE_PROVIDER_SITE_OTHER): Payer: Medicare Other

## 2019-04-29 DIAGNOSIS — R002 Palpitations: Secondary | ICD-10-CM | POA: Diagnosis not present

## 2019-05-07 ENCOUNTER — Telehealth: Payer: Self-pay | Admitting: Physician Assistant

## 2019-05-07 NOTE — Telephone Encounter (Signed)
Paged by [employer] to report:  Pt indicated "passed out". Rhythm captured was sinus rhythm at 80 bpm. This occurred at 5:16 pm today. [employer] was unable to reach the patient by phone.   I called both phone numbers listed for the patient and her husband - calls went to voicemail and I was unable to leave a voicemail.   I called EMS to dispatch to her home for a wellness check.

## 2019-05-07 NOTE — Progress Notes (Deleted)
PATIENT: Tasha [name] [facility] DOB: 12/11/1960  REASON FOR VISIT: follow up HISTORY FROM: patient  HISTORY OF PRESENT ILLNESS: Today 05/08/19  Tasha [name] is a 59 year old female with history of memory disturbance, anxiety, depression, headache, visual disturbance.  She is on Depakote for her headaches, Xanax for anxiety.  When she gets nervous, she developed a visual disturbance, feels like she gets tunnel vision, as result of anxiety, following that she will develop a headache.  She will take Xanax and the headache will improve.  HISTORY 11/07/2018 SS: Tasha [name] is a 59 year old female with history of memory disturbance, anxiety, depression, headache, and visual disturbance.  She remains on Depakote for migraines.  She is also taking Xanax for anxiety 1 mg three times a day as needed. She reports her headaches have improved while taking Depakote, but is still having 3-4 a week.  Her headaches are triggered by anxiety and nervousness.  She has a particularly stressful family situation.  When she gets nervous, she will develop a visual disturbance, feels like she gets tunnel vision, like a film covers both eyes.  This only occurs as result of anxiety.  Following the event, she will develop a headache.  She will have to take a Xanax and the headache will improve. She is concerned about her weight overtime while on Depakote. She does report issues with her balance. Last week she stumbling while walking.  She presents today for follow-up unaccompanied.   REVIEW OF SYSTEMS: Out of a complete 14 system review of symptoms, the patient complains only of the following symptoms, and all other reviewed systems are negative.  ALLERGIES: Allergies  Allergen Reactions  . Wilder Glade [Dapagliflozin] Other (See Comments)    Vag yeast inf  . Metformin And Related Diarrhea  . Statins Other (See Comments)    Muscle weakness/pains  . Crestor [Rosuvastatin] Other (See Comments)    Muscle aches  . Lipitor  [Atorvastatin] Other (See Comments)    Muscle aches  . Zocor [Simvastatin] Other (See Comments)    Leg pain per patient/PE  . Ozempic (0.25 Or 0.5 Mg-Dose) [Semaglutide(0.25 Or 0.25m-Dos)] Nausea And Vomiting    HOME MEDICATIONS: Outpatient Medications Prior to Visit  Medication Sig Dispense Refill  . ALPRAZolam (XANAX) 1 MG tablet Take 1 tablet (1 mg total) by mouth 3 (three) times daily as needed for anxiety. 90 tablet 3  . aspirin EC 81 MG tablet Take 1 tablet (81 mg total) by mouth daily. 90 tablet 1  . buPROPion (WELLBUTRIN XL) 150 MG 24 hr tablet Take 1 tablet (150 mg total) by mouth daily. 90 tablet 1  . Cholecalciferol (VITAMIN D3) 1.25 MG (50000 UT) CAPS Take 1 capsule by mouth once a week. 12 capsule 0  . divalproex (DEPAKOTE) 500 MG DR tablet Take 1 tablet (500 mg total) by mouth 2 (two) times daily. 60 tablet 4  . ezetimibe (ZETIA) 10 MG tablet Take 1 tablet (10 mg total) by mouth daily. 90 tablet 1  . levothyroxine (SYNTHROID) 175 MCG tablet Take 1 tablet (175 mcg total) by mouth daily before breakfast. 90 tablet 0  . NARCAN 4 MG/0.1ML LIQD nasal spray kit   2  . omega-3 acid ethyl esters (LOVAZA) 1 g capsule Take 2 capsules (2 g total) by mouth 2 (two) times daily. 360 capsule 1  . omeprazole (PRILOSEC) 40 MG capsule TAKE 2 CAPSULES BY MOUTH ONCE A DAY 180 capsule 1  . pravastatin (PRAVACHOL) 10 MG tablet Take 1 tablet (10 mg total)  by mouth daily. 90 tablet 1  . sertraline (ZOLOFT) 100 MG tablet TAKE 2 TABLETS (200MG) BY MOUTH ONCE A DAY 180 tablet 1   No facility-administered medications prior to visit.    PAST MEDICAL HISTORY: Past Medical History:  Diagnosis Date  . Allergy   . Anemia    past hx   . Anxiety state, unspecified   . Arthralgia of temporomandibular joint   . Arthritis   . Asthma    as a child , not now per pt   . Conversion disorder   . Dementia ([place])   . Depressive disorder, not elsewhere classified   . Diabetes mellitus without complication  ([place])    off all medicines as of 04-16-2019 PV   . Dysphagia, unspecified(787.20)   . Endometriosis   . Esophageal reflux   . Fibromyalgia   . Headache(784.0)   . Heart murmur    past hx murmur   . Irritable bowel syndrome   . Mitral valve disorders(424.0)   . Myalgia and myositis, unspecified   . Overweight(278.02)   . Periodic limb movement disorder   . Pure hypercholesterolemia   . RLS (restless legs syndrome)   . Seizures ([place])    last episode 5 yrs ago   . Stroke [facility])    TIA per pt many years ago   . Unspecified hypothyroidism     PAST SURGICAL HISTORY: Past Surgical History:  Procedure Laterality Date  . ABDOMINAL HYSTERECTOMY     TAH  . arm surgery    . COLONOSCOPY  04/14/2008  . FOOT SURGERY    . LIPOMA EXCISION     BESIDES BELLY BUTTON  . POLYPECTOMY     HPP x 1  . TONSILLECTOMY    . UPPER GASTROINTESTINAL ENDOSCOPY  2010   gessner   . WRIST SURGERY      FAMILY HISTORY: Family History  Problem Relation Age of Onset  . Heart disease Mother   . Heart disease Father   . Diabetes Father   . Colon cancer Father 84       died age 12 from colon cancer spreading per pt   . Breast cancer Maternal Aunt   . Colon cancer Paternal Aunt   . Stomach cancer Paternal Aunt   . Lung cancer Paternal Aunt   . Colon polyps Neg Hx   . Esophageal cancer Neg Hx     SOCIAL HISTORY: Social History   Socioeconomic History  . Marital status: Married    Spouse name: Water quality scientist Debnam  . Number of children: 2  . Years of education: 50  . Highest education level: Not on file  Occupational History  . Occupation: Disabled    Employer: [employer]  Tobacco Use  . Smoking status: Current Every Day Smoker    Packs/day: 1.00    Types: Cigarettes  . Smokeless tobacco: Never Used  . Tobacco comment: 5 or 6 per day  Substance and Sexual Activity  . Alcohol use: Yes    Alcohol/week: 1.0 standard drinks    Types: 1 Glasses of wine per week    Comment: rarely  . Drug  use: No  . Sexual activity: Yes  Other Topics Concern  . Not on file  Social History Narrative   Lives w/ husband Oneida Arenas [place]   Caffeine use: 2 cups coffee per day   Tea-3-4 drinks   Right handed    Social Determinants of Health   Financial Resource Strain:   . Difficulty of  Paying Living Expenses:   Food Insecurity:   . Worried About Charity fundraiser in the Last Year:   . Arboriculturist in the Last Year:   Transportation Needs:   . Film/video editor (Medical):   Marland Kitchen Lack of Transportation (Non-Medical):   Physical Activity:   . Days of Exercise per Week:   . Minutes of Exercise per Session:   Stress:   . Feeling of Stress :   Social Connections:   . Frequency of Communication with Friends and Family:   . Frequency of Social Gatherings with Friends and Family:   . Attends Religious Services:   . Active Member of Clubs or Organizations:   . Attends Archivist Meetings:   Marland Kitchen Marital Status:   Intimate Partner Violence:   . Fear of Current or Ex-Partner:   . Emotionally Abused:   Marland Kitchen Physically Abused:   . Sexually Abused:       PHYSICAL EXAM  There were no vitals filed for this visit. There is no height or weight on file to calculate BMI.  Generalized: Well developed, in no acute distress   Neurological examination  Mentation: Alert oriented to time, place, history taking. Follows all commands speech and language fluent Cranial nerve II-XII: Pupils were equal round reactive to light. Extraocular movements were full, visual field were full on confrontational test. Facial sensation and strength were normal. Uvula tongue midline. Head turning and shoulder shrug  were normal and symmetric. Motor: The motor testing reveals 5 over 5 strength of all 4 extremities. Good symmetric motor tone is noted throughout.  Sensory: Sensory testing is intact to soft touch on all 4 extremities. No evidence of extinction is noted.  Coordination: Cerebellar testing  reveals good finger-nose-finger and heel-to-shin bilaterally.  Gait and station: Gait is normal. Tandem gait is normal. Romberg is negative. No drift is seen.  Reflexes: Deep tendon reflexes are symmetric and normal bilaterally.   DIAGNOSTIC DATA (LABS, IMAGING, TESTING) - I reviewed patient records, labs, notes, testing and imaging myself where available.  Lab Results  Component Value Date   WBC 7.3 01/27/2019   HGB 13.7 01/27/2019   HCT 41.7 01/27/2019   MCV 95.5 01/27/2019   PLT 167.0 01/27/2019      Component Value Date/Time   NA 136 04/10/2019 1339   K 4.0 04/10/2019 1339   CL 101 04/10/2019 1339   CO2 29 04/10/2019 1339   GLUCOSE 212 (H) 04/10/2019 1339   BUN 14 04/10/2019 1339   CREATININE 0.96 04/10/2019 1339   CALCIUM 9.9 04/10/2019 1339   PROT 6.8 01/27/2019 1552   ALBUMIN 4.4 01/27/2019 1552   AST 17 01/27/2019 1552   ALT 18 01/27/2019 1552   ALKPHOS 71 01/27/2019 1552   BILITOT 0.3 01/27/2019 1552   GFRNONAA >60 10/30/2018 1052   GFRAA >60 10/30/2018 1052   Lab Results  Component Value Date   CHOL 207 (H) 01/27/2019   HDL 36.90 (L) 01/27/2019   LDLCALC 149 (H) 04/10/2016   LDLDIRECT 131.0 01/27/2019   TRIG 286.0 (H) 04/10/2019   CHOLHDL 6 01/27/2019   Lab Results  Component Value Date   HGBA1C 6.8 (H) 04/10/2019   Lab Results  Component Value Date   VITAMINB12 869 09/07/2017   Lab Results  Component Value Date   TSH 17.16 (H) 04/10/2019      ASSESSMENT AND PLAN 59 y.o. year old female  has a past medical history of Allergy, Anemia, Anxiety state, unspecified, Arthralgia  of temporomandibular joint, Arthritis, Asthma, Conversion disorder, Dementia ([place]), Depressive disorder, not elsewhere classified, Diabetes mellitus without complication ([place]), Dysphagia, unspecified(787.20), Endometriosis, Esophageal reflux, Fibromyalgia, Headache(784.0), Heart murmur, Irritable bowel syndrome, Mitral valve disorders(424.0), Myalgia and myositis, unspecified,  Overweight(278.02), Periodic limb movement disorder, Pure hypercholesterolemia, RLS (restless legs syndrome), Seizures ([place]), Stroke ([place]), and Unspecified hypothyroidism. here with ***   I spent 15 minutes with the patient. 50% of this time was spent   Butler Denmark, [place], DNP 05/08/2019, 8:17 AM [facility] [address] [place], [place] [address] [phone]

## 2019-05-08 ENCOUNTER — Ambulatory Visit: Payer: Medicare Other | Admitting: Neurology

## 2019-05-08 ENCOUNTER — Telehealth: Payer: Self-pay | Admitting: *Deleted

## 2019-05-08 NOTE — Telephone Encounter (Signed)
Spoke with pt and she states that she did not pass out yesterday.  States her grandson was crawling all over her yesterday and probably hit the button.  Advised pt to continue wearing the monitor.  Pt appreciative for call.

## 2019-05-08 NOTE — Telephone Encounter (Signed)
Patient was no show for follow up with NP today.  

## 2019-05-08 NOTE — Telephone Encounter (Signed)
Attempted to contact pt.  Left message to call back.

## 2019-05-12 ENCOUNTER — Ambulatory Visit: Payer: Medicare Other | Admitting: Obstetrics and Gynecology

## 2019-05-12 DIAGNOSIS — Z0289 Encounter for other administrative examinations: Secondary | ICD-10-CM

## 2019-05-14 ENCOUNTER — Emergency Department (HOSPITAL_COMMUNITY)
Admission: EM | Admit: 2019-05-14 | Discharge: 2019-05-15 | Disposition: A | Discharge status: Home / Self Care | Payer: Medicare Other | Attending: Emergency Medicine | Admitting: Emergency Medicine

## 2019-05-14 ENCOUNTER — Encounter (HOSPITAL_COMMUNITY): Payer: Self-pay | Admitting: Emergency Medicine

## 2019-05-14 ENCOUNTER — Other Ambulatory Visit: Payer: Self-pay

## 2019-05-14 ENCOUNTER — Encounter: Payer: Self-pay | Admitting: Internal Medicine

## 2019-05-14 ENCOUNTER — Ambulatory Visit (INDEPENDENT_AMBULATORY_CARE_PROVIDER_SITE_OTHER): Payer: Medicare Other | Admitting: Internal Medicine

## 2019-05-14 ENCOUNTER — Emergency Department (HOSPITAL_COMMUNITY): Payer: Medicare Other

## 2019-05-14 ENCOUNTER — Telehealth: Payer: Self-pay | Admitting: General Practice

## 2019-05-14 VITALS — BP 110/60 | HR 67 | Temp 96.3°F | Ht 67.0 in | Wt 167.0 lb

## 2019-05-14 DIAGNOSIS — Z7982 Long term (current) use of aspirin: Secondary | ICD-10-CM | POA: Diagnosis not present

## 2019-05-14 DIAGNOSIS — R1310 Dysphagia, unspecified: Secondary | ICD-10-CM

## 2019-05-14 DIAGNOSIS — Z79899 Other long term (current) drug therapy: Secondary | ICD-10-CM | POA: Diagnosis not present

## 2019-05-14 DIAGNOSIS — R072 Precordial pain: Secondary | ICD-10-CM | POA: Insufficient documentation

## 2019-05-14 DIAGNOSIS — R0789 Other chest pain: Secondary | ICD-10-CM

## 2019-05-14 DIAGNOSIS — R079 Chest pain, unspecified: Secondary | ICD-10-CM

## 2019-05-14 DIAGNOSIS — F17210 Nicotine dependence, cigarettes, uncomplicated: Secondary | ICD-10-CM | POA: Diagnosis not present

## 2019-05-14 DIAGNOSIS — I70219 Atherosclerosis of native arteries of extremities with intermittent claudication, unspecified extremity: Secondary | ICD-10-CM | POA: Diagnosis not present

## 2019-05-14 DIAGNOSIS — E039 Hypothyroidism, unspecified: Secondary | ICD-10-CM | POA: Diagnosis not present

## 2019-05-14 DIAGNOSIS — Z1211 Encounter for screening for malignant neoplasm of colon: Secondary | ICD-10-CM

## 2019-05-14 DIAGNOSIS — E119 Type 2 diabetes mellitus without complications: Secondary | ICD-10-CM | POA: Diagnosis not present

## 2019-05-14 DIAGNOSIS — R1319 Other dysphagia: Secondary | ICD-10-CM

## 2019-05-14 LAB — CBC
HCT: 42.4 % (ref 36.0–46.0)
Hemoglobin: 13.8 g/dL (ref 12.0–15.0)
MCH: 31.7 pg (ref 26.0–34.0)
MCHC: 32.5 g/dL (ref 30.0–36.0)
MCV: 97.2 fL (ref 80.0–100.0)
Platelets: 151 10*3/uL (ref 150–400)
RBC: 4.36 MIL/uL (ref 3.87–5.11)
RDW: 12 % (ref 11.5–15.5)
WBC: 7.1 10*3/uL (ref 4.0–10.5)
nRBC: 0 % (ref 0.0–0.2)

## 2019-05-14 LAB — BASIC METABOLIC PANEL
Anion gap: 9 (ref 5–15)
BUN: 10 mg/dL (ref 6–20)
CO2: 26 mmol/L (ref 22–32)
Calcium: 9.5 mg/dL (ref 8.9–10.3)
Chloride: 103 mmol/L (ref 98–111)
Creatinine, Ser: 0.73 mg/dL (ref 0.44–1.00)
GFR calc Af Amer: >60 mL/min (ref >60)
GFR calc non Af Amer: >60 mL/min (ref >60)
Glucose, Bld: 127 mg/dL — ABNORMAL HIGH (ref 70–99)
Potassium: 4 mmol/L (ref 3.5–5.1)
Sodium: 138 mmol/L (ref 135–145)

## 2019-05-14 LAB — TROPONIN I (HIGH SENSITIVITY)
Troponin I (High Sensitivity): 4 ng/L (ref <18)
Troponin I (High Sensitivity): 4 ng/L (ref <18)

## 2019-05-14 NOTE — ED Triage Notes (Signed)
Pt reports central chest pressure starting last night that radiates below left breast. Pt wearing heart monitor.

## 2019-05-14 NOTE — Progress Notes (Signed)
Tasha [name] 59 y.o. 02/21/1960 [id number]  Assessment & Plan:   Encounter Diagnoses  Name Primary?  . Chest pain, unspecified type Yes  . Esophageal dysphagia   . Colon cancer screening     Chest pain requires evaluation that I cannot provide I recommended an ambulance to ED She declined Is to go to ED for evaluation of chest pain as previously advised by cardiology.  Once this is sorted we can see about endoscopic procedures. Her TSH is up but I cannot feel any thyromegaly.   05/15/2019  She did go to ED and neg troponins and EKG - sxs better w/ GI cocktail.  We can schedule procedures now.  HQ:PRFFM, Arvid Right, MD   Subjective:   Chief Complaint: dysphagia, also having chest pain HPI This is a 59 year old white woman who is here because of dysphagia, also needing a screening colonoscopy, but awakened last night 3 times with stabbing chest pain in the central chest radiating to the left axilla.  There is no associated dyspnea or sweats.  The pain is a bit better but it persists.  She is wearing a cardiac monitor for evaluation of palpitations.  She called cardiology about the chest pain and they told her to go to the emergency room but she decided not to do so and to follow-up here or see me as planned.  She also experiences intermittent solid food dysphagia for a long time now.  Yesterday it occurred with pizza.  She has heartburn symptoms that are not completely resolved taking to 40 mg omeprazole daily.  Apparently pantoprazole used to work better but she cannot afford it due to insurance change.  She has been stressed having her grandchildren with her for the past 3 days.  She has other underlying stress and wonders of her chest pain might not be due to stress.  Also complains of intermittent left lower quadrant cramps. Allergies  Allergen Reactions  . Wilder Glade [Dapagliflozin] Other (See Comments)    Vag yeast inf  . Metformin And Related Diarrhea  . Statins  Other (See Comments)    Muscle weakness/pains  . Crestor [Rosuvastatin] Other (See Comments)    Muscle aches  . Lipitor [Atorvastatin] Other (See Comments)    Muscle aches  . Zocor [Simvastatin] Other (See Comments)    Leg pain per patient/PE  . Ozempic (0.25 Or 0.5 Mg-Dose) [Semaglutide(0.25 Or 0.55m-Dos)] Nausea And Vomiting   Current Meds  Medication Sig  . ALPRAZolam (XANAX) 1 MG tablet Take 1 tablet (1 mg total) by mouth 3 (three) times daily as needed for anxiety.  .Marland Kitchenaspirin EC 81 MG tablet Take 1 tablet (81 mg total) by mouth daily.  .Marland KitchenbuPROPion (WELLBUTRIN XL) 150 MG 24 hr tablet Take 1 tablet (150 mg total) by mouth daily.  . Cholecalciferol (VITAMIN D3) 1.25 MG (50000 UT) CAPS Take 1 capsule by mouth once a week.  . divalproex (DEPAKOTE) 500 MG DR tablet Take 1 tablet (500 mg total) by mouth 2 (two) times daily.  .Marland Kitchenezetimibe (ZETIA) 10 MG tablet Take 1 tablet (10 mg total) by mouth daily.  .Marland Kitchenlevothyroxine (SYNTHROID) 175 MCG tablet Take 1 tablet (175 mcg total) by mouth daily before breakfast.  . NARCAN 4 MG/0.1ML LIQD nasal spray kit   . omega-3 acid ethyl esters (LOVAZA) 1 g capsule Take 2 capsules (2 g total) by mouth 2 (two) times daily.  .Marland Kitchenomeprazole (PRILOSEC) 40 MG capsule TAKE 2 CAPSULES BY MOUTH ONCE A DAY  .  pravastatin (PRAVACHOL) 10 MG tablet Take 1 tablet (10 mg total) by mouth daily.  . sertraline (ZOLOFT) 100 MG tablet TAKE 2 TABLETS (200MG) BY MOUTH ONCE A DAY   Past Medical History:  Diagnosis Date  . Allergy   . Anemia    past hx   . Anxiety state, unspecified   . Arthralgia of temporomandibular joint   . Arthritis   . Asthma    as a child , not now per pt   . Conversion disorder   . Dementia ([place])   . Depressive disorder, not elsewhere classified   . Diabetes mellitus without complication ([place])    off all medicines as of 04-16-2019 PV   . Dysphagia, unspecified(787.20)   . Endometriosis   . Esophageal reflux   . Fibromyalgia   . Headache(784.0)    . Heart murmur    past hx murmur   . Irritable bowel syndrome   . Mitral valve disorders(424.0)   . Myalgia and myositis, unspecified   . Overweight(278.02)   . Periodic limb movement disorder   . Pure hypercholesterolemia   . RLS (restless legs syndrome)   . Seizures ([place])    last episode 5 yrs ago   . Stroke [facility])    TIA per pt many years ago   . Unspecified hypothyroidism    Past Surgical History:  Procedure Laterality Date  . ABDOMINAL HYSTERECTOMY     TAH  . arm surgery    . COLONOSCOPY  04/14/2008  . FOOT SURGERY    . LIPOMA EXCISION     BESIDES BELLY BUTTON  . POLYPECTOMY     HPP x 1  . TONSILLECTOMY    . UPPER GASTROINTESTINAL ENDOSCOPY  2010   Jannat Rosemeyer   . WRIST SURGERY     Social History   Social History Narrative   Lives w/ husband Kanasia Gayman   Caffeine use: 2 cups coffee per day   Tea-3-4 drinks   Right handed    family history includes Breast cancer in her maternal aunt; Colon cancer in her paternal aunt; Colon cancer (age of onset: 38) in her father; Diabetes in her father; Heart disease in her father and mother; Lung cancer in her paternal aunt; Stomach cancer in her paternal aunt.   Review of Systems As above  Objective:   Physical Exam @BP  110/60   Pulse 67   Temp (!) 96.3 F (35.7 C)   Ht 5' 7"  (1.702 m)   Wt 167 lb (75.8 kg)   BMI 26.16 kg/m @  General:  Well-developed, well-nourished and in no acute distress Eyes:  anicteric. Neck:   supple w/o thyromegaly or mass.  Lungs: Clear to auscultation bilaterally. The chest wall is completely nontender Heart:  S1S2, no rubs, murmurs, gallops. Abdomen:  soft, non-tender, no hepatosplenomegaly, hernia, or mass and BS+.   Neuro:  A&O x 3.  Psych:  appropriate mood and  Affect.   Data Reviewed:  See HPI I have reviewed recent primary care notes TSH is elevated

## 2019-05-14 NOTE — Telephone Encounter (Signed)
New message   Pt c/o of Chest Pain: STAT if CP now or developed within 24 hours  1. Are you having CP right now? Yes, patient is wearing a heart monitor   2. Are you experiencing any other symptoms (ex. SOB, nausea, vomiting, sweating)?tightness in chest  3. How long have you been experiencing CP? Patient states that the chest pain started last night   4. Is your CP continuous or coming and going? Coming and going  5. Have you taken Nitroglycerin?no  ?

## 2019-05-14 NOTE — Telephone Encounter (Signed)
I spoke to the patient who has been experiencing CP since last night and it has not subsided.  She feels "fatigue and very tired".  She denies SOB or it radiating anywhere.    She has a GI appointment later today 4/7, but I recommended that she go to the ED for further evaluation.  She verbalized understanding.

## 2019-05-14 NOTE — Patient Instructions (Signed)
You need to be evaluated in the emergency department for the chest pain.  You have declined an ambulance to the emergency department.  Once your chest pain evaluation is completed we can then schedule procedures to evaluate your swallowing problems and do a screening colonoscopy.  Gatha Mayer, MD, [facility]

## 2019-05-15 ENCOUNTER — Telehealth: Payer: Self-pay

## 2019-05-15 DIAGNOSIS — R072 Precordial pain: Secondary | ICD-10-CM | POA: Diagnosis not present

## 2019-05-15 MED ORDER — LIDOCAINE VISCOUS HCL 2 % MT SOLN
15.0000 mL | Freq: Once | OROMUCOSAL | Status: AC
  Administered 2019-05-15: 01:00:00 15 mL via ORAL
  Filled 2019-05-15: qty 15

## 2019-05-15 MED ORDER — HYOSCYAMINE SULFATE 0.125 MG SL SUBL
0.2500 mg | SUBLINGUAL_TABLET | Freq: Once | SUBLINGUAL | Status: AC
  Administered 2019-05-15: 0.25 mg via SUBLINGUAL
  Filled 2019-05-15: qty 2

## 2019-05-15 MED ORDER — ALUM & MAG HYDROXIDE-SIMETH 200-200-20 MG/5ML PO SUSP
30.0000 mL | Freq: Once | ORAL | Status: AC
  Administered 2019-05-15: 30 mL via ORAL
  Filled 2019-05-15: qty 30

## 2019-05-15 MED ORDER — HYOSCYAMINE SULFATE SL 0.125 MG SL SUBL: 1.0000 | SUBLINGUAL_TABLET | Freq: Three times a day (TID) | SUBLINGUAL | 0 refills | Status: DC | PRN

## 2019-05-15 MED FILL — OSCIMIN SL 0.125 MG TABLET: 0.125 | 5 days supply | Qty: 15 | Fill #0

## 2019-05-15 MED FILL — ALPRAZolam 1 MG TABS: 1 | 30 days supply | Qty: 90 | Fill #1

## 2019-05-15 NOTE — ED Notes (Signed)
Pt was discharged from the ED. Pt read and understood discharge paperwork. Pt had vital signs completed. Pt conscious, breathing, and A&Ox4. No distress noted. Pt speaking in complete sentences. Pt ambulated out of the ED with a smooth and steady gait. E-signature not available.

## 2019-05-15 NOTE — Telephone Encounter (Signed)
-----   Message from Gatha Mayer, MD sent at 05/15/2019 11:53 AM EDT ----- Regarding: set up procedures Please contact her and schedule EGD/dili and colonoscpy  She went to ED and the testing indicates problem not cardiac  Thank her for doing that to be safe

## 2019-05-15 NOTE — Telephone Encounter (Signed)
I called and left her a detailed message to call back so we can set her up for her ECL in the [facility] for dysphagia and colon cancer screening with Dr Carlean Purl. When she calls we will also set up a pre-visit to get her instructions.

## 2019-05-15 NOTE — ED Provider Notes (Signed)
[facility] EMERGENCY DEPARTMENT Provider Note  CSN: [id number] Arrival date & time: 05/14/19 1443  Chief Complaint(s) Chest Pain  HPI Tasha [name] is a 59 y.o. female    Chest Pain Pain location:  Substernal area Pain quality comment:  Cramping Radiates to: to left chest. Pain severity:  Moderate Onset quality:  Gradual Duration:  24 hours Timing:  Intermittent Progression:  Waxing and waning Chronicity:  Recurrent Relieved by: self resolves after 5-53mns; eating did make it feel better earlier this evening. Worsened by:  Nothing Associated symptoms: no anxiety, no cough, no fatigue, no nausea, no shortness of breath and no vomiting    Was seen by GI this afternoon who recommended ED evaluation.  They noted: She also experiences intermittent solid food dysphagia for a long time now. Yesterday it occurred with pizza.  She has heartburn symptoms that are not completely resolved taking to 40 mg omeprazole daily.  Past Medical History Past Medical History:  Diagnosis Date  . Allergy   . Anemia    past hx   . Anxiety state, unspecified   . Arthralgia of temporomandibular joint   . Arthritis   . Asthma    as a child , not now per pt   . Conversion disorder   . Dementia ([place]   . Depressive disorder, not elsewhere classified   . Diabetes mellitus without complication ([place]    off all medicines as of 04-16-2019 PV   . Dysphagia, unspecified(787.20)   . Endometriosis   . Esophageal reflux   . Fibromyalgia   . Headache(784.0)   . Heart murmur    past hx murmur   . Irritable bowel syndrome   . Mitral valve disorders(424.0)   . Myalgia and myositis, unspecified   . Overweight(278.02)   . Periodic limb movement disorder   . Pure hypercholesterolemia   . RLS (restless legs syndrome)   . Seizures ([place]    last episode 5 yrs ago   . Stroke ([facility]    TIA per pt many years ago   . Unspecified hypothyroidism    Patient Active Problem List   Diagnosis Date Noted  . Intermittent palpitations 04/10/2019  . Hyperglyceridemia, pure 08/07/2018  . Polyp of colon 07/25/2018  . Esophageal dysphagia 07/25/2018  . HIV antibody positive ([place] 09/27/2017  . Mild intermittent asthma without complication 085/46/2703 . Encounter for long-term opiate analgesic use 10/24/2016  . Primary insomnia 04/10/2016  . Extremity atherosclerosis with intermittent claudication ([place] 04/10/2016  . Routine general medical examination at a health care facility 12/20/2014  . Visit for screening mammogram 12/16/2014  . Cervical cancer screening 12/16/2014  . Primary osteoarthritis of both knees 06/22/2014  . Migraine without aura and with status migrainosus, not intractable 02/03/2014  . Nonspecific abnormal electrocardiogram (ECG) (EKG) 12/01/2013  . Type II diabetes mellitus with manifestations ([place] 09/11/2013  . Estrogen deficiency 05/20/2013  . Vitamin D deficiency 04/23/2013  . OBESITY, MILD 06/24/2008  . Hyperlipidemia with target LDL less than 130 09/08/2007  . Periodic limb movement disorder 09/08/2007  . CIGARETTE SMOKER 09/04/2007  . Hypothyroidism 11/22/2006  . Depression with anxiety 11/22/2006  . GERD 11/22/2006  . IBS 11/22/2006  . Nonalcoholic steatohepatitis (NASH) 11/22/2006   Home Medication(s) Prior to Admission medications   Medication Sig Start Date End Date Taking? Authorizing Provider  ALPRAZolam (Duanne Moron 1 MG tablet Take 1 tablet (1 mg total) by mouth 3 (three) times daily as needed for anxiety. 04/10/19   JJanith Lima  MD  aspirin EC 81 MG tablet Take 1 tablet (81 mg total) by mouth daily. 07/26/18   Janith Lima, MD  buPROPion (WELLBUTRIN XL) 150 MG 24 hr tablet Take 1 tablet (150 mg total) by mouth daily. 04/18/19   Janith Lima, MD  Cholecalciferol (VITAMIN D3) 1.25 MG (50000 UT) CAPS Take 1 capsule by mouth once a week. 04/18/19   Janith Lima, MD  divalproex (DEPAKOTE) 500 MG DR tablet Take 1 tablet (500 mg total)  by mouth 2 (two) times daily. 08/06/18   Suzzanne Cloud, NP  ezetimibe (ZETIA) 10 MG tablet Take 1 tablet (10 mg total) by mouth daily. 04/18/19   Janith Lima, MD  Hyoscyamine Sulfate SL (LEVSIN/SL) 0.125 MG SUBL Place 1 each under the tongue 3 (three) times daily as needed. 05/15/19   Fatima Blank, MD  levothyroxine (SYNTHROID) 175 MCG tablet Take 1 tablet (175 mcg total) by mouth daily before breakfast. 04/11/19   Janith Lima, MD  [facility] 4 MG/0.1ML LIQD nasal spray kit  07/16/17   [provider]  omega-3 acid ethyl esters (LOVAZA) 1 g capsule Take 2 capsules (2 g total) by mouth 2 (two) times daily. 04/18/19   Janith Lima, MD  omeprazole (PRILOSEC) 40 MG capsule TAKE 2 CAPSULES BY MOUTH ONCE A DAY 04/18/19   Janith Lima, MD  pravastatin (PRAVACHOL) 10 MG tablet Take 1 tablet (10 mg total) by mouth daily. 04/18/19   Janith Lima, MD  sertraline (ZOLOFT) 100 MG tablet TAKE 2 TABLETS (200MG) BY MOUTH ONCE A DAY 04/18/19   Janith Lima, MD                                                                                                                                    Past Surgical History Past Surgical History:  Procedure Laterality Date  . ABDOMINAL HYSTERECTOMY     TAH  . arm surgery    . COLONOSCOPY  04/14/2008  . FOOT SURGERY    . LIPOMA EXCISION     BESIDES BELLY BUTTON  . POLYPECTOMY     HPP x 1  . TONSILLECTOMY    . UPPER GASTROINTESTINAL ENDOSCOPY  2010   gessner   . WRIST SURGERY     Family History Family History  Problem Relation Age of Onset  . Heart disease Mother   . Heart disease Father   . Diabetes Father   . Colon cancer Father 35       died age 50 from colon cancer spreading per pt   . Breast cancer Maternal Aunt   . Colon cancer Paternal Aunt   . Stomach cancer Paternal Aunt   . Lung cancer Paternal Aunt   . Colon polyps Neg Hx   . Esophageal cancer Neg Hx     Social History Social History   Tobacco Use  . Smoking status:  Current  Every Day Smoker    Packs/day: 1.00    Types: Cigarettes  . Smokeless tobacco: Never Used  . Tobacco comment: 5 or 6 per day  Substance Use Topics  . Alcohol use: Yes    Alcohol/week: 1.0 standard drinks    Types: 1 Glasses of wine per week    Comment: rarely  . Drug use: No   Allergies Farxiga [dapagliflozin], Metformin and related, Statins, Crestor [rosuvastatin], Lipitor [atorvastatin], Zocor [simvastatin], and Ozempic (0.25 or 0.5 mg-dose) [semaglutide(0.25 or 0.35m-dos)]  Review of Systems Review of Systems  Constitutional: Negative for fatigue.  Respiratory: Negative for cough and shortness of breath.   Cardiovascular: Positive for chest pain.  Gastrointestinal: Negative for nausea and vomiting.   All other systems are reviewed and are negative for acute change except as noted in the HPI  Physical Exam Vital Signs  I have reviewed the triage vital signs BP (!) 122/57 (BP Location: Left Arm)   Pulse 65   Temp 98.1 F (36.7 C) (Oral)   Resp 16   Ht 5' 7"  (1.702 m)   Wt 76 kg   SpO2 99%   BMI 26.24 kg/m   Physical Exam Vitals reviewed.  Constitutional:      General: She is not in acute distress.    Appearance: She is well-developed. She is not diaphoretic.  HENT:     Head: Normocephalic and atraumatic.     Nose: Nose normal.  Eyes:     General: No scleral icterus.       Right eye: No discharge.        Left eye: No discharge.     Conjunctiva/sclera: Conjunctivae normal.     Pupils: Pupils are equal, round, and reactive to light.  Cardiovascular:     Rate and Rhythm: Normal rate and regular rhythm.     Heart sounds: No murmur. No friction rub. No gallop.   Pulmonary:     Effort: Pulmonary effort is normal. No respiratory distress.     Breath sounds: Normal breath sounds. No stridor. No rales.  Abdominal:     General: There is no distension.     Palpations: Abdomen is soft.     Tenderness: There is no abdominal tenderness.  Musculoskeletal:         General: No tenderness.     Cervical back: Normal range of motion and neck supple.  Skin:    General: Skin is warm and dry.     Findings: No erythema or rash.  Neurological:     Mental Status: She is alert and oriented to person, place, and time.     ED Results and Treatments Labs (all labs ordered are listed, but only abnormal results are displayed) Labs Reviewed  BASIC METABOLIC PANEL - Abnormal; Notable for the following components:      Result Value   Glucose, Bld 127 (*)    All other components within normal limits  CBC  I-STAT BETA HCG BLOOD, ED ([facility], [facility], [facility] ONLY)  TROPONIN I (HIGH SENSITIVITY)  TROPONIN I (HIGH SENSITIVITY)  EKG  EKG Interpretation  Date/Time:  Wednesday May 14 2019 14:47:16 EDT Ventricular Rate:  65 PR Interval:  152 QRS Duration: 64 QT Interval:  422 QTC Calculation: 438 R Axis:   47 Text Interpretation: Normal sinus rhythm Low voltage QRS Borderline ECG No acute changes Confirmed by Addison Lank [phone]) on 05/15/2019 12:11:18 AM      Radiology DG Chest 2 View  Result Date: 05/14/2019 CLINICAL DATA:  Chest pain. EXAM: CHEST - 2 VIEW COMPARISON:  January 28, 2019. FINDINGS: The heart size and mediastinal contours are within normal limits. Both lungs are clear. No pneumothorax or pleural effusion is noted. The visualized skeletal structures are unremarkable. IMPRESSION: No active cardiopulmonary disease. Electronically Signed   By: Marijo Conception M.D.   On: 05/14/2019 15:31    Pertinent labs & imaging results that were available during my care of the patient were reviewed by me and considered in my medical decision making (see chart for details).  Medications Ordered in ED Medications  alum & mag hydroxide-simeth (MAALOX/MYLANTA) 200-200-20 MG/5ML suspension 30 mL (30 mLs Oral Given 05/15/19 0111)    And  lidocaine (XYLOCAINE)  2 % viscous mouth solution 15 mL (15 mLs Oral Given 05/15/19 0111)  hyoscyamine (LEVSIN SL) SL tablet 0.25 mg (0.25 mg Sublingual Given 05/15/19 0111)                                                                                                                                    Procedures Procedures  (including critical care time)  Medical Decision Making / ED Course I have reviewed the nursing notes for this encounter and the patient's prior records (if available in EHR or on provided paperwork).   Tasha [name] was evaluated in Emergency Department on 05/15/2019 for the symptoms described in the history of present illness. She was evaluated in the context of the global COVID-19 pandemic, which necessitated consideration that the patient might be at risk for infection with the SARS-CoV-2 virus that causes COVID-19. Institutional protocols and algorithms that pertain to the evaluation of patients at risk for COVID-19 are in a state of rapid change based on information released by regulatory bodies including the CDC and federal and state organizations. These policies and algorithms were followed during the patient's care in the ED.  Patient presents with intermittent chest cramping. EKG without acute ischemic changes or evidence of pericarditis. Heart score of 4 with stable serial troponins that were negative.  This is approximately 24 hours since symptom onset.  Feel this is more related to GI process then cardiac etiology.  Symptoms resolved after GI cocktail.  Low suspicion for pulmonary embolism.  Presentation not classic for aortic dissection or esophageal perforation.  Chest x-ray without evidence suggestive of pneumonia, pneumothorax, pneumomediastinum.  No abnormal contour of the mediastinum to suggest dissection. No evidence of acute injuries.   Recommended close follow with PCP/cardiology  Final Clinical Impression(s) / ED Diagnoses Final diagnoses:  Discomfort in  chest    The patient appears reasonably screened and/or stabilized for discharge and I doubt any other medical condition or other [facility] requiring further screening, evaluation, or treatment in the ED at this time prior to discharge. Safe for discharge with strict return precautions.  Disposition: Discharge  Condition: Good  I have discussed the results, Dx and Tx plan with the patient/family who expressed understanding and agree(s) with the plan. Discharge instructions discussed at length. The patient/family was given strict return precautions who verbalized understanding of the instructions. No further questions at time of discharge.    ED Discharge Orders         Ordered    Hyoscyamine Sulfate SL (LEVSIN/SL) 0.125 MG SUBL  3 times daily PRN     05/15/19 0316           Follow Up: Janith Lima, MD [address] [address] [phone]  Schedule an appointment as soon as possible for a visit  As needed     This chart was dictated using voice recognition software.  Despite best efforts to proofread,  errors can occur which can change the documentation meaning.   Fatima Blank, MD 05/15/19 [phone]

## 2019-05-26 ENCOUNTER — Telehealth: Payer: Self-pay | Admitting: Internal Medicine

## 2019-05-26 NOTE — Telephone Encounter (Signed)
   Pt c/o medication issue:  1. Name of Medication: pravastatin (PRAVACHOL) 10 MG tablet  2. How are you currently taking this medication (dosage and times per day)? Stopped taking on 04/17  3. Are you having a reaction (difficulty breathing--STAT)? n/a  4. What is your medication issue? Patient states she can not take "statins". Patient has been having leg pain

## 2019-05-26 NOTE — Telephone Encounter (Signed)
Called and spoke to pt.  Scheduled her for her ECL, previsit and Covid test.

## 2019-05-27 NOTE — Telephone Encounter (Signed)
Pt states that she is having leg pain from the statin (pravastatin) and is not able to tolerate.   Do you want to change the medication? Please advise.

## 2019-05-27 NOTE — Telephone Encounter (Signed)
Pcp stated:  Just stop pravachol  If she can't tolerate this statin then she is not likely to tolerate any of them

## 2019-05-27 NOTE — Telephone Encounter (Signed)
Pt contacted and informed of same.  Pt stated understanding and will call back with any additional questions or concerns.

## 2019-06-13 ENCOUNTER — Other Ambulatory Visit: Payer: Self-pay | Admitting: Internal Medicine

## 2019-06-13 DIAGNOSIS — M170 Bilateral primary osteoarthritis of knee: Secondary | ICD-10-CM

## 2019-06-13 MED FILL — VITAMIN D3 50,000 UNITS CAP: 1.25 MG | 84 days supply | Qty: 12 | Fill #0

## 2019-06-13 MED FILL — ALPRAZolam 1 MG TABS: 1 | 30 days supply | Qty: 90 | Fill #2

## 2019-06-13 MED FILL — OMEPRAZOLE 40 MG CPDR: 40 | 90 days supply | Qty: 180 | Fill #1

## 2019-06-13 MED FILL — EZETIMIBE 10 MG TABS: 10 | 90 days supply | Qty: 90 | Fill #1

## 2019-06-16 ENCOUNTER — Ambulatory Visit (AMBULATORY_SURGERY_CENTER): Payer: Self-pay

## 2019-06-16 ENCOUNTER — Telehealth: Payer: Self-pay

## 2019-06-16 ENCOUNTER — Telehealth: Payer: Self-pay | Admitting: Internal Medicine

## 2019-06-16 VITALS — Temp 97.1°F | Ht 67.0 in | Wt 165.8 lb

## 2019-06-16 DIAGNOSIS — R1310 Dysphagia, unspecified: Secondary | ICD-10-CM

## 2019-06-16 DIAGNOSIS — R1319 Other dysphagia: Secondary | ICD-10-CM

## 2019-06-16 DIAGNOSIS — Z01818 Encounter for other preprocedural examination: Secondary | ICD-10-CM

## 2019-06-16 DIAGNOSIS — Z1211 Encounter for screening for malignant neoplasm of colon: Secondary | ICD-10-CM

## 2019-06-16 NOTE — Telephone Encounter (Signed)
New message:   Pt is calling in reference to her spleen. Please advise.

## 2019-06-16 NOTE — Progress Notes (Signed)
No allergies to soy or egg Pt is not on blood thinners or diet pills Denies issues with sedation/intubation Denies atrial flutter/fib Denies constipation   Emmi instructions given to pt  Pt is aware of Covid safety and care partner requirements.  Care Partner 98.4

## 2019-06-16 NOTE — Telephone Encounter (Signed)
Patient called wanting to know results of the monitor that she wore. If you can please call the patient back today. Also she has a issue about an appointment that was canceled and she stated that she never even made that appointment

## 2019-06-17 ENCOUNTER — Encounter: Payer: Self-pay | Admitting: Internal Medicine

## 2019-06-17 ENCOUNTER — Telehealth: Payer: Self-pay | Admitting: Internal Medicine

## 2019-06-17 NOTE — Telephone Encounter (Signed)
Pt wants to know if Dr. Carlean Purl will check her pancreas when she has her endocolon scheduled next Monday. Pt states that the doctor that she saw at the ED wanted her pancreas checked. Pt also states that she has been having pain on her leg and back. It started on her left leg but has expanded to both legs and back. Pt mentioned this at nurse during her PV yesterday and was told that we were going to call her to advise if she can still have her procedure or not. Pls call her.

## 2019-06-17 NOTE — Telephone Encounter (Signed)
LVM for pt to call back to further discuss

## 2019-06-17 NOTE — Telephone Encounter (Signed)
Left message on machine to call back  

## 2019-06-18 ENCOUNTER — Encounter: Payer: Self-pay | Admitting: Internal Medicine

## 2019-06-18 NOTE — Telephone Encounter (Signed)
Explained to patient she is put on monitor schedule the day she started her monitor in order to import her test results.  She did not miss an appointment.  Her monitor test was read by Dr. Irish Lack on 06/13/2019.  She should receive her monitor results from the ordering provider, Dr. [name].

## 2019-06-19 ENCOUNTER — Other Ambulatory Visit: Payer: Medicare Other

## 2019-06-19 ENCOUNTER — Ambulatory Visit (INDEPENDENT_AMBULATORY_CARE_PROVIDER_SITE_OTHER): Payer: Medicare Other

## 2019-06-19 DIAGNOSIS — Z1159 Encounter for screening for other viral diseases: Secondary | ICD-10-CM | POA: Diagnosis not present

## 2019-06-19 NOTE — Telephone Encounter (Signed)
Pt returning your call

## 2019-06-19 NOTE — Telephone Encounter (Signed)
Patient returned your call.

## 2019-06-19 NOTE — Telephone Encounter (Signed)
Left message for patient to call back  

## 2019-06-20 ENCOUNTER — Encounter: Payer: Self-pay | Admitting: Internal Medicine

## 2019-06-20 LAB — SARS CORONAVIRUS 2 (TAT 6-24 HRS): SARS Coronavirus 2: NEGATIVE

## 2019-06-20 NOTE — Telephone Encounter (Signed)
Patient notified that the endo/colon will not look at her pancreas.  She is encouraged to discuss with Dr. Carlean Purl her concerns at the appt next week.  All questions answered

## 2019-06-23 ENCOUNTER — Ambulatory Visit (AMBULATORY_SURGERY_CENTER): Payer: Medicare Other | Admitting: Internal Medicine

## 2019-06-23 ENCOUNTER — Other Ambulatory Visit: Payer: Self-pay

## 2019-06-23 ENCOUNTER — Encounter: Payer: Self-pay | Admitting: Internal Medicine

## 2019-06-23 VITALS — BP 108/52 | HR 56 | Temp 97.3°F | Resp 9 | Ht 67.0 in | Wt 165.8 lb

## 2019-06-23 DIAGNOSIS — R1310 Dysphagia, unspecified: Secondary | ICD-10-CM

## 2019-06-23 DIAGNOSIS — D124 Benign neoplasm of descending colon: Secondary | ICD-10-CM | POA: Diagnosis not present

## 2019-06-23 DIAGNOSIS — D123 Benign neoplasm of transverse colon: Secondary | ICD-10-CM

## 2019-06-23 DIAGNOSIS — D125 Benign neoplasm of sigmoid colon: Secondary | ICD-10-CM | POA: Diagnosis not present

## 2019-06-23 DIAGNOSIS — Z1211 Encounter for screening for malignant neoplasm of colon: Secondary | ICD-10-CM

## 2019-06-23 LAB — HM COLONOSCOPY

## 2019-06-23 MED ORDER — SODIUM CHLORIDE 0.9 % IV SOLN: 500.0000 mL | Freq: Once | INTRAVENOUS | Status: DC

## 2019-06-23 NOTE — Progress Notes (Signed)
Temperature taken by L.C., VS taken by C.W.

## 2019-06-23 NOTE — Progress Notes (Signed)
Called to room to assist during endoscopic procedure.  Patient ID and intended procedure confirmed with present staff. Received instructions for my participation in the procedure from the performing physician.  

## 2019-06-23 NOTE — Progress Notes (Signed)
Pt's states no medical or surgical changes since previsit or office visit. 

## 2019-06-23 NOTE — Progress Notes (Signed)
No problems noted in the recovery room. maw 

## 2019-06-23 NOTE — Progress Notes (Signed)
To PACU, VSS. Report to Rn.tb 

## 2019-06-23 NOTE — Progress Notes (Signed)
Temperature taken by L.S., VS taken by C.W.

## 2019-06-23 NOTE — Op Note (Signed)
[place] Patient Name: Tasha [name] Procedure Date: 06/23/2019 2:30 PM MRN: [id number] Endoscopist: Gatha Mayer , MD Age: 59 Referring MD:  Date of Birth: July 27, 1960 Gender: Female Account #: [id number] Procedure:                Upper GI endoscopy Indications:              Dysphagia Medicines:                Propofol per Anesthesia, Monitored Anesthesia Care Procedure:                Pre-Anesthesia Assessment:                           - Prior to the procedure, a History and Physical                            was performed, and patient medications and                            allergies were reviewed. The patient's tolerance of                            previous anesthesia was also reviewed. The risks                            and benefits of the procedure and the sedation                            options and risks were discussed with the patient.                            All questions were answered, and informed consent                            was obtained. Prior Anticoagulants: The patient has                            taken no previous anticoagulant or antiplatelet                            agents. ASA Grade Assessment: III - A patient with                            severe systemic disease. After reviewing the risks                            and benefits, the patient was deemed in                            satisfactory condition to undergo the procedure.                           After obtaining informed consent, the endoscope was  passed under direct vision. Throughout the                            procedure, the patient's blood pressure, pulse, and                            oxygen saturations were monitored continuously. The                            Endoscope was introduced through the mouth, and                            advanced to the second part of duodenum. The upper                            GI endoscopy was  accomplished without difficulty.                            The patient tolerated the procedure well. Scope In: Scope Out: Findings:                 The examined esophagus was mildly tortuous.                           The entire examined stomach was normal.                           The examined duodenum was normal.                           The cardia and gastric fundus were normal on                            retroflexion.                           The scope was withdrawn. Dilation was performed in                            the entire esophagus with a Maloney dilator with                            mild resistance at 54 Fr. Complications:            No immediate complications. Estimated Blood Loss:     Estimated blood loss: none. Impression:               - Tortuous esophagus.                           - Normal stomach.                           - Normal examined duodenum.                           - Dilation performed in the entire esophagus.                           -  No specimens collected. Recommendation:           - Patient has a contact number available for                            emergencies. The signs and symptoms of potential                            delayed complications were discussed with the                            patient. Return to normal activities tomorrow.                            Written discharge instructions were provided to the                            patient.                           - Clear liquids x 1 hour then soft foods rest of                            day. Start prior diet tomorrow.                           - Continue present medications.                           - See the other procedure note for documentation of                            additional recommendations.                           - If dilation does not provide satisfactory she is                            to contact me and we will consider other evaluation. Gatha Mayer, MD 06/23/2019 3:18:46 PM This report has been signed electronically.

## 2019-06-23 NOTE — Progress Notes (Signed)
Vitals-KA Temp-CW

## 2019-06-23 NOTE — Patient Instructions (Addendum)
I dilated the esophagus to help you swallow better. I did not see a blockage or narrow area but sometimes there are spasms in the muscles. If you are not satisfied with the results from this let me know - we would need to consider additional testing.  I also found and removed 7 tiny colon polyps.  You also have a condition called diverticulosis - common and not usually a problem. Please read the handout provided.  I will let you know pathology results and when to have another routine colonoscopy by mail and/or My Chart.  I appreciate the opportunity to care for you. Gatha Mayer, MD, FACG     YOU HAD AN ENDOSCOPIC PROCEDURE TODAY AT [facility]:   Refer to the procedure report that was given to you for any specific questions about what was found during the examination.  If the procedure report does not answer your questions, please call your gastroenterologist to clarify.  If you requested that your care partner not be given the details of your procedure findings, then the procedure report has been included in a sealed envelope for you to review at your convenience later.  YOU SHOULD EXPECT: Some feelings of bloating in the abdomen. Passage of more gas than usual.  Walking can help get rid of the air that was put into your GI tract during the procedure and reduce the bloating. If you had a lower endoscopy (such as a colonoscopy or flexible sigmoidoscopy) you may notice spotting of blood in your stool or on the toilet paper. If you underwent a bowel prep for your procedure, you may not have a normal bowel movement for a few days.  Please Note:  You might notice some irritation and congestion in your nose or some drainage.  This is from the oxygen used during your procedure.  There is no need for concern and it should clear up in a day or so.  SYMPTOMS TO REPORT IMMEDIATELY:   Following lower endoscopy (colonoscopy or flexible sigmoidoscopy):  Excessive amounts of blood in  the stool  Significant tenderness or worsening of abdominal pains  Swelling of the abdomen that is new, acute  Fever of 100F or higher   Following upper endoscopy (EGD)  Vomiting of blood or coffee ground material  New chest pain or pain under the shoulder blades  Painful or persistently difficult swallowing  New shortness of breath  Fever of 100F or higher  Black, tarry-looking stools  For urgent or emergent issues, a gastroenterologist can be reached at any hour by calling [phone]. Do not use MyChart messaging for urgent concerns.    DIET:  Please follow the dilatation diet the rest of today.  A handout was given to you to follow.  Drink plenty of fluids but you should avoid alcoholic beverages for 24 hours.  ACTIVITY:  You should plan to take it easy for the rest of today and you should NOT DRIVE or use heavy machinery until tomorrow (because of the sedation medicines used during the test).    FOLLOW UP: Our staff will call the number listed on your records 48-72 hours following your procedure to check on you and address any questions or concerns that you may have regarding the information given to you following your procedure. If we do not reach you, we will leave a message.  We will attempt to reach you two times.  During this call, we will ask if you have developed any symptoms of COVID  19. If you develop any symptoms (ie: fever, flu-like symptoms, shortness of breath, cough etc.) before then, please call [phone].  If you test positive for Covid 19 in the 2 weeks post procedure, please call and report this information to [place].    If any biopsies were taken you will be contacted by phone or by letter within the next 1-3 weeks.  Please call us at [phone] if you have not heard about the biopsies in 3 weeks.    SIGNATURES/CONFIDENTIALITY: You and/or your care partner have signed paperwork which will be entered into your electronic medical record.  These signatures  attest to the fact that that the information above on your After Visit Summary has been reviewed and is understood.  Full responsibility of the confidentiality of this discharge information lies with you and/or your care-partner.     Handouts were given to you colon polyps, diverticulosis, hemorrhoids, and the dilatation diet to follow the rest of today. Your blood sugar was 114 in the recovery room. You may resume your current medications today. Await biopsy results. Please call if any questions or concerns.

## 2019-06-23 NOTE — Op Note (Signed)
[facility] Patient Name: Tasha [name] Procedure Date: 06/23/2019 2:29 PM MRN: [id number] Endoscopist: Gatha Mayer , MD Age: 59 Referring MD:  Date of Birth: 1960/05/09 Gender: Female Account #: [id number] Procedure:                Colonoscopy Indications:              Screening for colorectal malignant neoplasm Medicines:                Propofol per Anesthesia, Monitored Anesthesia Care Procedure:                Pre-Anesthesia Assessment:                           - Prior to the procedure, a History and Physical                            was performed, and patient medications and                            allergies were reviewed. The patient's tolerance of                            previous anesthesia was also reviewed. The risks                            and benefits of the procedure and the sedation                            options and risks were discussed with the patient.                            All questions were answered, and informed consent                            was obtained. Prior Anticoagulants: The patient has                            taken no previous anticoagulant or antiplatelet                            agents. ASA Grade Assessment: III - A patient with                            severe systemic disease. After reviewing the risks                            and benefits, the patient was deemed in                            satisfactory condition to undergo the procedure.                           After obtaining informed consent, the colonoscope  was passed under direct vision. Throughout the                            procedure, the patient's blood pressure, pulse, and                            oxygen saturations were monitored continuously. The                            Colonoscope was introduced through the anus and                            advanced to the the cecum, identified by     appendiceal orifice and ileocecal valve. The                            colonoscopy was performed without difficulty. The                            patient tolerated the procedure well. The quality                            of the bowel preparation was good. The ileocecal                            valve, appendiceal orifice, and rectum were                            photographed. The bowel preparation used was                            Miralax via split dose instruction. Scope In: 2:49:05 PM Scope Out: 3:06:53 PM Scope Withdrawal Time: 0 hours 11 minutes 6 seconds  Total Procedure Duration: 0 hours 17 minutes 48 seconds  Findings:                 The perianal and digital rectal examinations were                            normal.                           Seven sessile polyps were found in the sigmoid                            colon, descending colon and transverse colon. The                            polyps were diminutive in size. These polyps were                            removed with a cold snare. Resection and retrieval                            were complete. Verification  of patient                            identification for the specimen was done. Estimated                            blood loss was minimal.                           External hemorrhoids were found.                           Multiple diverticula were found in the sigmoid                            colon and ascending colon.                           The exam was otherwise without abnormality on                            direct and retroflexion views. Complications:            No immediate complications. Estimated Blood Loss:     Estimated blood loss was minimal. Impression:               - Seven diminutive polyps in the sigmoid colon, in                            the descending colon and in the transverse colon,                            removed with a cold snare. Resected and retrieved.                            - External hemorrhoids.                           - Diverticulosis in the sigmoid colon and in the                            ascending colon.                           - The examination was otherwise normal on direct                            and retroflexion views. Recommendation:           - Patient has a contact number available for                            emergencies. The signs and symptoms of potential                            delayed complications were discussed with the  patient. Return to normal activities tomorrow.                            Written discharge instructions were provided to the                            patient.                           - Continue present medications.                           - Repeat colonoscopy is recommended. The                            colonoscopy date will be determined after pathology                            results from today's exam become available for                            review. Gatha Mayer, MD 06/23/2019 3:24:38 PM This report has been signed electronically.

## 2019-06-25 ENCOUNTER — Telehealth: Payer: Self-pay | Admitting: *Deleted

## 2019-06-25 ENCOUNTER — Telehealth: Payer: Self-pay | Admitting: Internal Medicine

## 2019-06-25 ENCOUNTER — Telehealth: Payer: Self-pay

## 2019-06-25 NOTE — Telephone Encounter (Signed)
Pt started having pain below her right shoulder blade this morning after her post follow up call. She was a colonoscopy on [address]. She had no abdominal pain or swelling/bleeding post procedure. She is eating and drinking fine. This pain isnt constant it only hurts when she turns her body a certain way and with that her pain is 6/10. I told her its possible there could be some "trapped air" still there and encouraged her to walk and take Gas-X/ simethicone and that may help. Will notify Dr. Carlean Purl.

## 2019-06-25 NOTE — Telephone Encounter (Signed)
  Follow up Call-  Call back number 06/23/2019  Post procedure Call Back phone  # [phone]  Permission to leave phone message Yes  Some recent data might be hidden     Patient questions:  Do you have a fever, pain , or abdominal swelling? No. Pain Score  0 *  Have you tolerated food without any problems? Yes.    Have you been able to return to your normal activities? Yes.    Do you have any questions about your discharge instructions: Diet   No. Medications  No. Follow up visit  No.  Do you have questions or concerns about your Care? No.  Actions: * If pain score is 4 or above: No action needed, pain <4. 1. Have you developed a fever since your procedure? no  2.   Have you had an respiratory symptoms (SOB or cough) since your procedure? no  3.   Have you tested positive for COVID 19 since your procedure no  4.   Have you had any family members/close contacts diagnosed with the COVID 19 since your procedure?  no   If yes to any of these questions please route to Joylene John, RN and Erenest Rasher, RN

## 2019-06-26 NOTE — Telephone Encounter (Signed)
Given that it is positional quite likely from abdominal pressure that might have been applied.  Acetaminophen, moist heat prn  If does not improve with time, worsens, fever etc call back

## 2019-06-27 ENCOUNTER — Telehealth: Payer: Self-pay | Admitting: *Deleted

## 2019-06-27 NOTE — Telephone Encounter (Signed)
Pt having very little upper abd pain now maybe 02/08/08. Instructed the patient to call us if she needed anything else or pain returned.

## 2019-07-03 ENCOUNTER — Encounter: Payer: Self-pay | Admitting: Internal Medicine

## 2019-07-17 ENCOUNTER — Other Ambulatory Visit: Payer: Self-pay | Admitting: Internal Medicine

## 2019-07-17 DIAGNOSIS — E039 Hypothyroidism, unspecified: Secondary | ICD-10-CM

## 2019-07-22 ENCOUNTER — Encounter: Payer: Self-pay | Admitting: Internal Medicine

## 2019-07-22 ENCOUNTER — Ambulatory Visit (INDEPENDENT_AMBULATORY_CARE_PROVIDER_SITE_OTHER): Payer: Medicare Other | Admitting: Internal Medicine

## 2019-07-22 ENCOUNTER — Other Ambulatory Visit: Payer: Self-pay

## 2019-07-22 VITALS — BP 130/72 | HR 66 | Temp 98.5°F | Resp 16 | Ht 67.0 in | Wt 167.0 lb

## 2019-07-22 DIAGNOSIS — I70219 Atherosclerosis of native arteries of extremities with intermittent claudication, unspecified extremity: Secondary | ICD-10-CM

## 2019-07-22 DIAGNOSIS — E118 Type 2 diabetes mellitus with unspecified complications: Secondary | ICD-10-CM

## 2019-07-22 DIAGNOSIS — K7581 Nonalcoholic steatohepatitis (NASH): Secondary | ICD-10-CM | POA: Diagnosis not present

## 2019-07-22 DIAGNOSIS — E039 Hypothyroidism, unspecified: Secondary | ICD-10-CM

## 2019-07-22 DIAGNOSIS — E781 Pure hyperglyceridemia: Secondary | ICD-10-CM

## 2019-07-22 DIAGNOSIS — E559 Vitamin D deficiency, unspecified: Secondary | ICD-10-CM | POA: Diagnosis not present

## 2019-07-22 LAB — HEPATIC FUNCTION PANEL
ALT: 36 U/L — ABNORMAL HIGH (ref 0–35)
AST: 31 U/L (ref 0–37)
Albumin: 4.2 g/dL (ref 3.5–5.2)
Alkaline Phosphatase: 78 U/L (ref 39–117)
Bilirubin, Direct: 0.1 mg/dL (ref 0.0–0.3)
Total Bilirubin: 0.4 mg/dL (ref 0.2–1.2)
Total Protein: 6.9 g/dL (ref 6.0–8.3)

## 2019-07-22 LAB — HEMOGLOBIN A1C: Hgb A1c MFr Bld: 8.8 % — ABNORMAL HIGH (ref 4.6–6.5)

## 2019-07-22 LAB — BASIC METABOLIC PANEL
BUN: 11 mg/dL (ref 6–23)
CO2: 29 mEq/L (ref 19–32)
Calcium: 9.2 mg/dL (ref 8.4–10.5)
Chloride: 104 mEq/L (ref 96–112)
Creatinine, Ser: 0.65 mg/dL (ref 0.40–1.20)
GFR: 92.88 mL/min (ref >60.00)
Glucose, Bld: 96 mg/dL (ref 70–99)
Potassium: 3.9 mEq/L (ref 3.5–5.1)
Sodium: 138 mEq/L (ref 135–145)

## 2019-07-22 LAB — TSH: TSH: 0.04 u[IU]/mL — ABNORMAL LOW (ref 0.35–4.50)

## 2019-07-22 LAB — TRIGLYCERIDES: Triglycerides: 201 mg/dL — ABNORMAL HIGH (ref 0.0–149.0)

## 2019-07-22 LAB — VITAMIN D 25 HYDROXY (VIT D DEFICIENCY, FRACTURES): VITD: 82.97 ng/mL (ref 30.00–100.00)

## 2019-07-22 NOTE — Progress Notes (Signed)
Subjective:  Patient ID: Tasha [name], female    DOB: 02-12-1960  Age: 59 y.o. MRN: [id number]  CC: Diabetes, Hypothyroidism, and Hyperlipidemia  This visit occurred during the SARS-CoV-2 public health emergency.  Safety protocols were in place, including screening questions prior to the visit, additional usage of staff PPE, and extensive cleaning of exam room while observing appropriate contact time as indicated for disinfecting solutions.    HPI Tasha [name] presents for f/up - She complains of weight gain but otherwise feels well and offers no other complaints.  She does not monitor her blood pressure or her blood sugar.  She denies polys.  She is active and denies any recent episodes of chest pain, shortness of breath, palpitations, edema, or fatigue.  Outpatient Medications Prior to Visit  Medication Sig Dispense Refill  . ALPRAZolam (XANAX) 1 MG tablet Take 1 tablet (1 mg total) by mouth 3 (three) times daily as needed for anxiety. 90 tablet 3  . aspirin EC 81 MG tablet Take 1 tablet (81 mg total) by mouth daily. 90 tablet 1  . buPROPion (WELLBUTRIN XL) 150 MG 24 hr tablet Take 1 tablet (150 mg total) by mouth daily. 90 tablet 1  . divalproex (DEPAKOTE) 500 MG DR tablet Take 1 tablet (500 mg total) by mouth 2 (two) times daily. 60 tablet 4  . ezetimibe (ZETIA) 10 MG tablet Take 1 tablet (10 mg total) by mouth daily. 90 tablet 1  . Hyoscyamine Sulfate SL (LEVSIN/SL) 0.125 MG SUBL Place 1 each under the tongue 3 (three) times daily as needed. 15 tablet 0  . NARCAN 4 MG/0.1ML LIQD nasal spray kit   2  . omega-3 acid ethyl esters (LOVAZA) 1 g capsule Take 2 capsules (2 g total) by mouth 2 (two) times daily. 360 capsule 1  . omeprazole (PRILOSEC) 40 MG capsule TAKE 2 CAPSULES BY MOUTH ONCE A DAY 180 capsule 1  . pravastatin (PRAVACHOL) 10 MG tablet Take 1 tablet (10 mg total) by mouth daily. 90 tablet 1  . sertraline (ZOLOFT) 100 MG tablet TAKE 2 TABLETS (200MG) BY MOUTH  ONCE A DAY 180 tablet 1  . Cholecalciferol (VITAMIN D3) 1.25 MG (50000 UT) CAPS Take 1 capsule by mouth once a week. 12 capsule 0  . levothyroxine (SYNTHROID) 175 MCG tablet Take 1 tablet (175 mcg total) by mouth daily before breakfast. 90 tablet 0  . amoxicillin-clavulanate (AUGMENTIN) 875-125 MG tablet Take 1 tablet by mouth 2 (two) times daily.    . chlorhexidine (PERIDEX) 0.12 % solution 15 mLs 2 (two) times daily.    . clindamycin (CLEOCIN) 300 MG capsule Take 300 mg by mouth every 6 (six) hours.    Marland Kitchen ibuprofen (ADVIL) 800 MG tablet Take 800 mg by mouth every 8 (eight) hours as needed.     No facility-administered medications prior to visit.    ROS Review of Systems  Constitutional: Negative.  Negative for appetite change, chills, diaphoresis, fatigue and fever.  HENT: Negative.  Negative for sore throat and trouble swallowing.   Eyes: Negative for visual disturbance.  Respiratory: Negative for cough, chest tightness, shortness of breath and wheezing.   Cardiovascular: Negative for chest pain, palpitations and leg swelling.  Gastrointestinal: Negative for abdominal pain, constipation, diarrhea, nausea and vomiting.  Endocrine: Negative.  Negative for cold intolerance, heat intolerance, polydipsia, polyphagia and polyuria.  Genitourinary: Negative.  Negative for difficulty urinating.  Musculoskeletal: Negative for arthralgias, joint swelling and myalgias.  Skin: Negative.  Negative for color change,  pallor and rash.  Neurological: Negative.  Negative for dizziness, weakness, light-headedness and headaches.  Hematological: Negative for adenopathy. Does not bruise/bleed easily.  Psychiatric/Behavioral: Negative.     Objective:  BP 130/72 (BP Location: Left Arm, Patient Position: Sitting, Cuff Size: Large)   Pulse 66   Temp 98.5 F (36.9 C) (Oral)   Resp 16   Ht 5' 7"  (1.702 m)   Wt 167 lb (75.8 kg)   SpO2 97%   BMI 26.16 kg/m   BP Readings from Last 3 Encounters:  07/22/19  130/72  06/23/19 (!) 108/52  05/15/19 122/72    Wt Readings from Last 3 Encounters:  07/22/19 167 lb (75.8 kg)  06/23/19 165 lb 12.8 oz (75.2 kg)  06/16/19 165 lb 12.8 oz (75.2 kg)    Physical Exam Vitals reviewed.  Constitutional:      Appearance: Normal appearance.  HENT:     Nose: Nose normal.     Mouth/Throat:     Mouth: Mucous membranes are moist.  Eyes:     General: No scleral icterus.    Conjunctiva/sclera: Conjunctivae normal.  Cardiovascular:     Rate and Rhythm: Normal rate and regular rhythm.     Heart sounds: No murmur heard.   Pulmonary:     Effort: Pulmonary effort is normal.     Breath sounds: No stridor. No wheezing, rhonchi or rales.  Abdominal:     General: Abdomen is protuberant. Bowel sounds are normal. There is no distension.     Palpations: Abdomen is soft. There is no hepatomegaly, splenomegaly or mass.     Tenderness: There is no abdominal tenderness.  Musculoskeletal:        General: Normal range of motion.     Cervical back: Neck supple.     Right lower leg: No edema.     Left lower leg: No edema.  Lymphadenopathy:     Cervical: No cervical adenopathy.  Skin:    General: Skin is warm and dry.     Coloration: Skin is not pale.  Neurological:     General: No focal deficit present.     Mental Status: She is alert and oriented to person, place, and time. Mental status is at baseline.  Psychiatric:        Mood and Affect: Mood normal.        Behavior: Behavior normal.     Lab Results  Component Value Date   WBC 7.1 05/14/2019   HGB 13.8 05/14/2019   HCT 42.4 05/14/2019   PLT 151 05/14/2019   GLUCOSE 96 07/22/2019   CHOL 207 (H) 01/27/2019   TRIG 201.0 (H) 07/22/2019   HDL 36.90 (L) 01/27/2019   LDLDIRECT 131.0 01/27/2019   LDLCALC 149 (H) 04/10/2016   ALT 36 (H) 07/22/2019   AST 31 07/22/2019   NA 138 07/22/2019   K 3.9 07/22/2019   CL 104 07/22/2019   CREATININE 0.65 07/22/2019   BUN 11 07/22/2019   CO2 29 07/22/2019   TSH  0.04 (L) 07/22/2019   INR 1.0 01/27/2019   HGBA1C 8.8 (H) 07/22/2019   MICROALBUR <0.7 01/27/2019    DG Chest 2 View  Result Date: 05/14/2019 CLINICAL DATA:  Chest pain. EXAM: CHEST - 2 VIEW COMPARISON:  January 28, 2019. FINDINGS: The heart size and mediastinal contours are within normal limits. Both lungs are clear. No pneumothorax or pleural effusion is noted. The visualized skeletal structures are unremarkable. IMPRESSION: No active cardiopulmonary disease. Electronically Signed   By: Jeneen Rinks  Murlean Caller M.D.   On: 05/14/2019 15:31    Assessment & Plan:   Patt was seen today for diabetes, hypothyroidism and hyperlipidemia.  Diagnoses and all orders for this visit:  Acquired hypothyroidism- Her TSH is suppressed.  I recommended that she lower her dose of levothyroxine. -     TSH; Future -     TSH -     levothyroxine (SYNTHROID) 150 MCG tablet; Take 1 tablet (150 mcg total) by mouth daily.  Type II diabetes mellitus with manifestations ([place])- Her A1c is up to 8.8% and she has NASH.  I recommended that she start taking pioglitazone and a GLP-1 agonist. -     Basic metabolic panel; Future -     Hemoglobin A1c; Future -     Ambulatory referral to Ophthalmology -     Hemoglobin A1c -     Basic metabolic panel -     pioglitazone (ACTOS) 15 MG tablet; Take 1 tablet (15 mg total) by mouth daily. -     Semaglutide (RYBELSUS) 3 MG TABS; Take 1 tablet by mouth daily. -     Consult to [facility] -     Continuous Blood Gluc Receiver (FREESTYLE LIBRE 14 DAY READER) DEVI; 1 Act by Does not apply route daily. -     Continuous Blood Gluc Sensor (FREESTYLE LIBRE 14 DAY SENSOR) MISC; 1 Act by Does not apply route daily.  Hyperglyceridemia, pure- Improvement noted.  Vitamin D deficiency- Her vitamin D level is normal now. -     VITAMIN D 25 Hydroxy (Vit-D Deficiency, Fractures); Future -     VITAMIN D 25 Hydroxy (Vit-D Deficiency, Fractures)  Nonalcoholic steatohepatitis (NASH) -      Hepatic function panel; Future -     Hepatic function panel -     pioglitazone (ACTOS) 15 MG tablet; Take 1 tablet (15 mg total) by mouth daily.  Primary hypertriglyceridemia -     Triglycerides; Future -     Triglycerides   I have discontinued Cephus Shelling. Insalaco "Kim"'s levothyroxine and Vitamin D3. I am also having her start on pioglitazone, levothyroxine, Rybelsus, FreeStyle Libre 14 Day Reader, and [employer] 14 Day Sensor. Additionally, I am having her maintain her Narcan, aspirin EC, divalproex, ALPRAZolam, sertraline, omeprazole, pravastatin, ezetimibe, buPROPion, omega-3 acid ethyl esters, Hyoscyamine Sulfate SL, ibuprofen, clindamycin, chlorhexidine, and amoxicillin-clavulanate.  Meds ordered this encounter  Medications  . pioglitazone (ACTOS) 15 MG tablet    Sig: Take 1 tablet (15 mg total) by mouth daily.    Dispense:  90 tablet    Refill:  1  . levothyroxine (SYNTHROID) 150 MCG tablet    Sig: Take 1 tablet (150 mcg total) by mouth daily.    Dispense:  90 tablet    Refill:  0  . Semaglutide (RYBELSUS) 3 MG TABS    Sig: Take 1 tablet by mouth daily.    Dispense:  30 tablet    Refill:  0  . Continuous Blood Gluc Receiver (FREESTYLE LIBRE 14 DAY READER) DEVI    Sig: 1 Act by Does not apply route daily.    Dispense:  6 each    Refill:  5  . Continuous Blood Gluc Sensor (FREESTYLE LIBRE 14 DAY SENSOR) MISC    Sig: 1 Act by Does not apply route daily.    Dispense:  6 each    Refill:  5     Follow-up: Return in about 3 months (around 10/22/2019).  [name], MD

## 2019-07-22 NOTE — Patient Instructions (Signed)
Hypothyroidism  Hypothyroidism is when the thyroid gland does not make enough of certain hormones (it is underactive). The thyroid gland is a small gland located in the lower front part of the neck, just in front of the windpipe (trachea). This gland makes hormones that help control how the body uses food for energy (metabolism) as well as how the heart and brain function. These hormones also play a role in keeping your bones strong. When the thyroid is underactive, it produces too little of the hormones thyroxine (T4) and triiodothyronine (T3). What are the causes? This condition may be caused by:  Hashimoto's disease. This is a disease in which the body's disease-fighting system (immune system) attacks the thyroid gland. This is the most common cause.  Viral infections.  Pregnancy.  Certain medicines.  Birth defects.  Past radiation treatments to the head or neck for cancer.  Past treatment with radioactive iodine.  Past exposure to radiation in the environment.  Past surgical removal of part or all of the thyroid.  Problems with a gland in the center of the brain (pituitary gland).  Lack of enough iodine in the diet. What increases the risk? You are more likely to develop this condition if:  You are female.  You have a family history of thyroid conditions.  You use a medicine called lithium.  You take medicines that affect the immune system (immunosuppressants). What are the signs or symptoms? Symptoms of this condition include:  Feeling as though you have no energy (lethargy).  Not being able to tolerate cold.  Weight gain that is not explained by a change in diet or exercise habits.  Lack of appetite.  Dry skin.  Coarse hair.  Menstrual irregularity.  Slowing of thought processes.  Constipation.  Sadness or depression. How is this diagnosed? This condition may be diagnosed based on:  Your symptoms, your medical history, and a physical exam.  Blood  tests. You may also have imaging tests, such as an ultrasound or MRI. How is this treated? This condition is treated with medicine that replaces the thyroid hormones that your body does not make. After you begin treatment, it may take several weeks for symptoms to go away. Follow these instructions at home:  Take over-the-counter and prescription medicines only as told by your health care provider.  If you start taking any new medicines, tell your health care provider.  Keep all follow-up visits as told by your health care provider. This is important. ? As your condition improves, your dosage of thyroid hormone medicine may change. ? You will need to have blood tests regularly so that your health care provider can monitor your condition. Contact a health care provider if:  Your symptoms do not get better with treatment.  You are taking thyroid replacement medicine and you: ? Sweat a lot. ? Have tremors. ? Feel anxious. ? Lose weight rapidly. ? Cannot tolerate heat. ? Have emotional swings. ? Have diarrhea. ? Feel weak. Get help right away if you have:  Chest pain.  An irregular heartbeat.  A rapid heartbeat.  Difficulty breathing. Summary  Hypothyroidism is when the thyroid gland does not make enough of certain hormones (it is underactive).  When the thyroid is underactive, it produces too little of the hormones thyroxine (T4) and triiodothyronine (T3).  The most common cause is Hashimoto's disease, a disease in which the body's disease-fighting system (immune system) attacks the thyroid gland. The condition can also be caused by viral infections, medicine, pregnancy, or past   radiation treatment to the head or neck.  Symptoms may include weight gain, dry skin, constipation, feeling as though you do not have energy, and not being able to tolerate cold.  This condition is treated with medicine to replace the thyroid hormones that your body does not make. This information  is not intended to replace advice given to you by your health care provider. Make sure you discuss any questions you have with your health care provider. Document Revised: 01/05/2017 Document Reviewed: 01/03/2017 Elsevier Patient Education  2020 Elsevier Inc.  

## 2019-07-23 ENCOUNTER — Encounter: Payer: Self-pay | Admitting: Internal Medicine

## 2019-07-23 MED ORDER — LEVOTHYROXINE SODIUM 150 MCG PO TABS: 150.0000 ug | ORAL_TABLET | Freq: Every day | ORAL | 0 refills | Status: DC

## 2019-07-23 MED ORDER — FREESTYLE LIBRE 14 DAY READER DEVI: 1.0000 | Freq: Every day | 5 refills | Status: DC

## 2019-07-23 MED ORDER — RYBELSUS 3 MG PO TABS: 1.0000 | ORAL_TABLET | Freq: Every day | ORAL | 0 refills | Status: DC

## 2019-07-23 MED ORDER — FREESTYLE LIBRE 14 DAY SENSOR MISC: 1.0000 | Freq: Every day | 5 refills | Status: DC

## 2019-07-23 MED ORDER — PIOGLITAZONE HCL 15 MG PO TABS: 15.0000 mg | ORAL_TABLET | Freq: Every day | ORAL | 1 refills | Status: DC

## 2019-07-23 MED FILL — PIOGLITAZONE HCL 15 MG TAB: 15 | 90 days supply | Qty: 90 | Fill #0

## 2019-07-24 MED FILL — FREESTYLE LIBRE 14 DAY SENS: 14 days supply | Qty: 1 | Fill #0

## 2019-07-24 NOTE — Telephone Encounter (Signed)
Pt came in for her samples. Went over the dosing instructions with her and she stated understanding. Also informed pt that the rx for the continuous meter may not be approved and I would not be able to get approval before she left for the beach. Pt stated understanding and will confirm if the meter requires a PA.

## 2019-07-25 ENCOUNTER — Encounter: Payer: Self-pay | Admitting: Internal Medicine

## 2019-08-05 ENCOUNTER — Telehealth: Payer: Self-pay | Admitting: Internal Medicine

## 2019-08-05 ENCOUNTER — Other Ambulatory Visit: Payer: Self-pay | Admitting: *Deleted

## 2019-08-05 DIAGNOSIS — E039 Hypothyroidism, unspecified: Secondary | ICD-10-CM

## 2019-08-05 NOTE — Patient Outreach (Addendum)
[facility]) Care Management  08/05/2019  Shaune [facility] [birth year] [id number]   Subjective: Telephone call to patient's home / mobile number, spoke with patient, and HIPAA verified.  Discussed [facility] Care Management Medicare  MD referral follow up, patient voiced understanding, and is in agreement to follow up.  Patient states she is doing okay, just [place] recently from the beach, had follow up visit with primary MD on 07/22/2019, and visit went okay.   States her A1C was 8.8 on 07/22/2019, was taken off metformin approximately 5 months ago, and recently started new diabetes medication.   States her thyroid is also low and makes her feel bad overall.  States she does not currently see an endocrinologist,  primary MD is managing diabetes, and thyroid issues.  States she has concerns with primary MD office, has experience some delays, does not understand how thing work in the office, has discussed her concern with primary MD and / or office.  States she wanting her thyroid level to be regulated and diabetes under better control.  Patient states she is more concerned with thyroid at this time than diabetes.  States she is need of more diabetes education, was diagnosed approximately 2 years ago (2019),  and in agreement to [place] [facility] services in the future. States she has also concerns regarding thyroid medication, is waiting for call back from primary MD's office, RNCM encouraged patient follow up with primary MD's office, patient voices understanding, and is in agreement.  States she also received a  Freestyle Libre (continous glucose monitoring) machine from MD's office, it fell  off after a few hours while she was at [employer], and she has contacted the company regarding obtaining a new machine.   States she fell through her deck on 08/03/2019, sustained bruising to left chin, no open wounds, advised of signs/ symptoms of infection to monitor, advised to report to primary MD as  soon as possible, patient voices understanding, and states she will follow up with provider.  Patient states she is able to manage some self care and has assistance as needed. States she is accessing her Medicare benefits as needed via member services number on back of card.   States she was told by primary MD's office that her secondary insurance was no longer active and her husband has verified that secondary insurance is active in the past. Patient encouraged to follow up with insurance company regarding additional diabetes management benefits, to verify secondary insurance coverage, patient voices understanding, and states she will follow up.  Patient states she does not have any transportation, [employer], or pharmacy needs at this time.  Patient in agreement to follow up at a later time to continue to assess needs, may need diabetes care coordination services, and will assess at next outreach.        Objective: Per KPN (Knowledge Performance Now, point of care tool) and chart review, patient has not had any recent hospitalizations.   Patient had a ED visit on 05/14/2019 for chest pain.   Patient also has a history of diabetes, hypothyroidism, stroke, asthma, endometriosis, IBS, Nonalcoholic steatohepatitis (NASH), vitamin D deficiency, hypertriglyceridemia,  and hyperlipidemia.       Assessment: Received Medicare MD Referral on 07/23/2019.  Referral source: Dr. [name].   Referral reason: poorly controlled DM2 (Type II diabetes mellitus).   Screening follow up in progress, pending additional patient outreach,  and will further assess care management needs.       Plan:  RNCM will call patient for telephone outreach attempt, within 10 business days, telephone screening assessment follow up.       Finneas Mathe H. Annia Friendly, BSN, [place] [facility] Telephonic CM Phone: [phone] Fax: [phone]

## 2019-08-05 NOTE — Telephone Encounter (Signed)
Pt only has spouse listed on DPR. Not able to speak to dtr.   Called number listed for pt - line picked up and then dropped.

## 2019-08-05 NOTE — Telephone Encounter (Signed)
    Patients daughter Tasha [name] calling, she would like to know why levothyroxine (SYNTHROID) 150 MCG tablet was not sent to local pharmacy. Patient has not received medication  She has reached out to [facility] they were not able to give her a reason as well  Tasha [name] requesting call back

## 2019-08-06 MED ORDER — LEVOTHYROXINE SODIUM 150 MCG PO TABS: 150.0000 ug | ORAL_TABLET | Freq: Every day | ORAL | 0 refills | Status: DC

## 2019-08-06 MED FILL — SYNTHROID 150 MCG TABLET: 150 | 30 days supply | Qty: 30 | Fill #0

## 2019-08-06 NOTE — Telephone Encounter (Signed)
   Patient calling to request chart review of her medications. Patient states errors have been made with her medications. Requesting call from clinical supervisor

## 2019-08-06 NOTE — Telephone Encounter (Signed)
   Please return call to Erich Montane

## 2019-08-06 NOTE — Telephone Encounter (Signed)
Pt contacted and she confirmed that she was out of her thyroid medication. Erx sent to local pharmacy until the mail order pharmacy can deliver.   Patient informed that the pharmacy should have it ready soon.   Patient requested that I call her dtr Janett Billow

## 2019-08-06 NOTE — Telephone Encounter (Signed)
LVM for Janett Billow to call back at her convenience.

## 2019-08-06 NOTE — Telephone Encounter (Signed)
Contacted Tasha [name] and we reviewed pt last 3 visits and A1c results with PCP recommendation for each to manage DM. Informed the reason Metformin was discontinued - side effects were diarrhea and pt A1c was down to 6.8% in December of 2020 and then down further to 6.2% in March.   Spoke about the Synthroid and were pt would like to have it filled. Tasha [name] will call [facility] and find out the price of the name brand Synthroid. Tasha [name] will message me back and let me know what/where to send the refills for Synthroid.   Informed that pt has a follow up visit in September with PCP.

## 2019-08-07 NOTE — Telephone Encounter (Signed)
Per pt request, I spoke with her re: chart review for medications.  She states she feels as though her medications have been mismanaged & would like explanations as to why certain drugs were discontinued/prescribed.  She states she doesn't feel like herself & feels as though she has been neglected by per healthcare providers.  Pt encouraged to come in to discuss medication concerns with dr Ronnald Ramp & is agreeable.  Appt made for 08/12/19 at 1600.

## 2019-08-12 ENCOUNTER — Ambulatory Visit (INDEPENDENT_AMBULATORY_CARE_PROVIDER_SITE_OTHER): Payer: Medicare Other | Admitting: Internal Medicine

## 2019-08-12 ENCOUNTER — Encounter: Payer: Self-pay | Admitting: Internal Medicine

## 2019-08-12 ENCOUNTER — Other Ambulatory Visit: Payer: Self-pay

## 2019-08-12 VITALS — BP 138/74 | HR 65 | Temp 98.8°F | Resp 16 | Ht 67.0 in | Wt 165.4 lb

## 2019-08-12 DIAGNOSIS — I70219 Atherosclerosis of native arteries of extremities with intermittent claudication, unspecified extremity: Secondary | ICD-10-CM | POA: Diagnosis not present

## 2019-08-12 DIAGNOSIS — F332 Major depressive disorder, recurrent severe without psychotic features: Secondary | ICD-10-CM | POA: Insufficient documentation

## 2019-08-12 DIAGNOSIS — L508 Other urticaria: Secondary | ICD-10-CM | POA: Insufficient documentation

## 2019-08-12 DIAGNOSIS — E118 Type 2 diabetes mellitus with unspecified complications: Secondary | ICD-10-CM

## 2019-08-12 MED ORDER — HYDROXYZINE HCL 25 MG PO TABS: 25.0000 mg | ORAL_TABLET | Freq: Three times a day (TID) | ORAL | 1 refills | Status: DC | PRN

## 2019-08-12 MED ORDER — VORTIOXETINE HBR 10 MG PO TABS: 10.0000 mg | ORAL_TABLET | Freq: Every day | ORAL | 0 refills | Status: AC

## 2019-08-12 MED ORDER — FREESTYLE LIBRE 14 DAY SENSOR MISC: 1.0000 | Freq: Every day | 5 refills | Status: DC

## 2019-08-12 MED ORDER — VORTIOXETINE HBR 5 MG PO TABS: 5.0000 mg | ORAL_TABLET | Freq: Every day | ORAL | 0 refills | Status: AC

## 2019-08-12 MED ORDER — VORTIOXETINE HBR 5 MG PO TABS: 5.0000 mg | ORAL_TABLET | Freq: Every day | ORAL | 0 refills | Status: DC

## 2019-08-12 MED ORDER — ACCU-CHEK GUIDE ME W/DEVICE KIT: 1.0000 | PACK | Freq: Three times a day (TID) | 0 refills | Status: DC | PRN

## 2019-08-12 MED ORDER — FREESTYLE LIBRE 14 DAY READER DEVI: 1.0000 | Freq: Every day | 5 refills | Status: DC

## 2019-08-12 MED ORDER — VORTIOXETINE HBR 10 MG PO TABS: 10.0000 mg | ORAL_TABLET | Freq: Every day | ORAL | 0 refills | Status: DC

## 2019-08-12 MED FILL — hydrOXYzine HCL 25 MG TABS: 25 | 16 days supply | Qty: 50 | Fill #0

## 2019-08-12 NOTE — Progress Notes (Deleted)
Subjective:  Patient ID: Tasha [name], female    DOB: 06-07-1960  Age: 59 y.o. MRN: [id number]  CC: Depression   HPI [facility] presents for ***  Outpatient Medications Prior to Visit  Medication Sig Dispense Refill  . ALPRAZolam (XANAX) 1 MG tablet Take 1 tablet (1 mg total) by mouth 3 (three) times daily as needed for anxiety. 90 tablet 3  . aspirin EC 81 MG tablet Take 1 tablet (81 mg total) by mouth daily. 90 tablet 1  . chlorhexidine (PERIDEX) 0.12 % solution 15 mLs 2 (two) times daily.    . divalproex (DEPAKOTE) 500 MG DR tablet Take 1 tablet (500 mg total) by mouth 2 (two) times daily. 60 tablet 4  . ezetimibe (ZETIA) 10 MG tablet Take 1 tablet (10 mg total) by mouth daily. 90 tablet 1  . ibuprofen (ADVIL) 800 MG tablet Take 800 mg by mouth every 8 (eight) hours as needed.    Marland Kitchen levothyroxine (SYNTHROID) 150 MCG tablet Take 1 tablet (150 mcg total) by mouth daily. 30 tablet 0  . NARCAN 4 MG/0.1ML LIQD nasal spray kit   2  . omega-3 acid ethyl esters (LOVAZA) 1 g capsule Take 2 capsules (2 g total) by mouth 2 (two) times daily. 360 capsule 1  . omeprazole (PRILOSEC) 40 MG capsule TAKE 2 CAPSULES BY MOUTH ONCE A DAY 180 capsule 1  . pioglitazone (ACTOS) 15 MG tablet Take 1 tablet (15 mg total) by mouth daily. 90 tablet 1  . Semaglutide (RYBELSUS) 3 MG TABS Take 1 tablet by mouth daily. 30 tablet 0  . buPROPion (WELLBUTRIN XL) 150 MG 24 hr tablet Take 1 tablet (150 mg total) by mouth daily. 90 tablet 1  . Continuous Blood Gluc Receiver (FREESTYLE LIBRE 14 DAY READER) DEVI 1 Act by Does not apply route daily. 6 each 5  . Continuous Blood Gluc Sensor (FREESTYLE LIBRE 14 DAY SENSOR) MISC 1 Act by Does not apply route daily. 6 each 5  . sertraline (ZOLOFT) 100 MG tablet TAKE 2 TABLETS (200MG) BY MOUTH ONCE A DAY 180 tablet 1  . amoxicillin-clavulanate (AUGMENTIN) 875-125 MG tablet Take 1 tablet by mouth 2 (two) times daily.    . clindamycin (CLEOCIN) 300 MG capsule Take  300 mg by mouth every 6 (six) hours.    . Hyoscyamine Sulfate SL (LEVSIN/SL) 0.125 MG SUBL Place 1 each under the tongue 3 (three) times daily as needed. 15 tablet 0   No facility-administered medications prior to visit.    ROS Review of Systems  Objective:  BP 138/74 (BP Location: Left Arm, Patient Position: Sitting, Cuff Size: Large)   Pulse 65   Temp 98.8 F (37.1 C) (Oral)   Resp 16   Ht 5' 7"  (1.702 m)   Wt 165 lb 6 oz (75 kg)   SpO2 98%   BMI 25.90 kg/m   BP Readings from Last 3 Encounters:  08/12/19 138/74  07/22/19 130/72  06/23/19 (!) 108/52    Wt Readings from Last 3 Encounters:  08/12/19 165 lb 6 oz (75 kg)  07/22/19 167 lb (75.8 kg)  06/23/19 165 lb 12.8 oz (75.2 kg)    Physical Exam  Lab Results  Component Value Date   WBC 7.1 05/14/2019   HGB 13.8 05/14/2019   HCT 42.4 05/14/2019   PLT 151 05/14/2019   GLUCOSE 96 07/22/2019   CHOL 207 (H) 01/27/2019   TRIG 201.0 (H) 07/22/2019   HDL 36.90 (L) 01/27/2019   LDLDIRECT  131.0 01/27/2019   LDLCALC 149 (H) 04/10/2016   ALT 36 (H) 07/22/2019   AST 31 07/22/2019   NA 138 07/22/2019   K 3.9 07/22/2019   CL 104 07/22/2019   CREATININE 0.65 07/22/2019   BUN 11 07/22/2019   CO2 29 07/22/2019   TSH 0.04 (L) 07/22/2019   INR 1.0 01/27/2019   HGBA1C 8.8 (H) 07/22/2019   MICROALBUR <0.7 01/27/2019    DG Chest 2 View  Result Date: 05/14/2019 CLINICAL DATA:  Chest pain. EXAM: CHEST - 2 VIEW COMPARISON:  January 28, 2019. FINDINGS: The heart size and mediastinal contours are within normal limits. Both lungs are clear. No pneumothorax or pleural effusion is noted. The visualized skeletal structures are unremarkable. IMPRESSION: No active cardiopulmonary disease. Electronically Signed   By: Marijo Conception M.D.   On: 05/14/2019 15:31    Assessment & Plan:   Yazmin was seen today for depression.  Diagnoses and all orders for this visit:  Type II diabetes mellitus with manifestations ([place]) -      Continuous Blood Gluc Receiver (FREESTYLE LIBRE 14 DAY READER) DEVI; 1 Act by Does not apply route daily. -     Continuous Blood Gluc Sensor (FREESTYLE LIBRE 14 DAY SENSOR) MISC; 1 Act by Does not apply route daily. -     Blood Glucose Monitoring Suppl (ACCU-CHEK GUIDE ME) w/Device KIT; 1 Act by Does not apply route 3 (three) times daily as needed. -     vortioxetine HBr (TRINTELLIX) 5 MG TABS tablet; Take 1 tablet (5 mg total) by mouth daily for 7 days. -     vortioxetine HBr (TRINTELLIX) 10 MG TABS tablet; Take 1 tablet (10 mg total) by mouth daily for 28 days.   I have discontinued Cephus Shelling. Mosteller "Tasha [name]"'s sertraline, buPROPion, Hyoscyamine Sulfate SL, clindamycin, and amoxicillin-clavulanate. I am also having her start on Accu-Chek Guide Me, vortioxetine HBr, and vortioxetine HBr. Additionally, I am having her maintain her Narcan, aspirin EC, divalproex, ALPRAZolam, omeprazole, ezetimibe, omega-3 acid ethyl esters, ibuprofen, chlorhexidine, pioglitazone, Rybelsus, levothyroxine, FreeStyle Libre 14 Day Reader, and [employer] 14 Day Sensor.  Meds ordered this encounter  Medications  . Continuous Blood Gluc Receiver (FREESTYLE LIBRE 14 DAY READER) DEVI    Sig: 1 Act by Does not apply route daily.    Dispense:  6 each    Refill:  5  . Continuous Blood Gluc Sensor (FREESTYLE LIBRE 14 DAY SENSOR) MISC    Sig: 1 Act by Does not apply route daily.    Dispense:  6 each    Refill:  5  . Blood Glucose Monitoring Suppl (ACCU-CHEK GUIDE ME) w/Device KIT    Sig: 1 Act by Does not apply route 3 (three) times daily as needed.    Dispense:  1 kit    Refill:  0  . vortioxetine HBr (TRINTELLIX) 5 MG TABS tablet    Sig: Take 1 tablet (5 mg total) by mouth daily for 7 days.    Dispense:  7 tablet    Refill:  0  . vortioxetine HBr (TRINTELLIX) 10 MG TABS tablet    Sig: Take 1 tablet (10 mg total) by mouth daily for 28 days.    Dispense:  28 tablet    Refill:  0     Follow-up: Return in about 2  months (around 10/13/2019).  [name], MD

## 2019-08-12 NOTE — Progress Notes (Signed)
Subjective:  Patient ID: Tasha [name], female    DOB: 19-Apr-1960  Age: 59 y.o. MRN: [id number]  CC: Depression  This visit occurred during the SARS-CoV-2 public health emergency.  Safety protocols were in place, including screening questions prior to the visit, additional usage of staff PPE, and extensive cleaning of exam room while observing appropriate contact time as indicated for disinfecting solutions.    HPI Tasha [name] presents for f/up - She has had hives off and on on her extremities for the last few weeks.  She thinks it is related to her nerves.  She feels like her current antidepressant regimen is not working.  She complains of crying spells, feeling irritable, having angry outbursts, feeling hopeless and helpless, anhedonia, and fatigue.  She denies SI or HI.  She is getting modest symptom relief with Xanax but feels like her other antidepressants are not helping.  Outpatient Medications Prior to Visit  Medication Sig Dispense Refill  . ALPRAZolam (XANAX) 1 MG tablet Take 1 tablet (1 mg total) by mouth 3 (three) times daily as needed for anxiety. 90 tablet 3  . aspirin EC 81 MG tablet Take 1 tablet (81 mg total) by mouth daily. 90 tablet 1  . chlorhexidine (PERIDEX) 0.12 % solution 15 mLs 2 (two) times daily.    . divalproex (DEPAKOTE) 500 MG DR tablet Take 1 tablet (500 mg total) by mouth 2 (two) times daily. 60 tablet 4  . ezetimibe (ZETIA) 10 MG tablet Take 1 tablet (10 mg total) by mouth daily. 90 tablet 1  . ibuprofen (ADVIL) 800 MG tablet Take 800 mg by mouth every 8 (eight) hours as needed.    Marland Kitchen levothyroxine (SYNTHROID) 150 MCG tablet Take 1 tablet (150 mcg total) by mouth daily. 30 tablet 0  . NARCAN 4 MG/0.1ML LIQD nasal spray kit   2  . omega-3 acid ethyl esters (LOVAZA) 1 g capsule Take 2 capsules (2 g total) by mouth 2 (two) times daily. 360 capsule 1  . omeprazole (PRILOSEC) 40 MG capsule TAKE 2 CAPSULES BY MOUTH ONCE A DAY 180 capsule 1  .  pioglitazone (ACTOS) 15 MG tablet Take 1 tablet (15 mg total) by mouth daily. 90 tablet 1  . Semaglutide (RYBELSUS) 3 MG TABS Take 1 tablet by mouth daily. 30 tablet 0  . buPROPion (WELLBUTRIN XL) 150 MG 24 hr tablet Take 1 tablet (150 mg total) by mouth daily. 90 tablet 1  . Continuous Blood Gluc Receiver (FREESTYLE LIBRE 14 DAY READER) DEVI 1 Act by Does not apply route daily. 6 each 5  . Continuous Blood Gluc Sensor (FREESTYLE LIBRE 14 DAY SENSOR) MISC 1 Act by Does not apply route daily. 6 each 5  . sertraline (ZOLOFT) 100 MG tablet TAKE 2 TABLETS (200MG) BY MOUTH ONCE A DAY 180 tablet 1  . amoxicillin-clavulanate (AUGMENTIN) 875-125 MG tablet Take 1 tablet by mouth 2 (two) times daily.    . clindamycin (CLEOCIN) 300 MG capsule Take 300 mg by mouth every 6 (six) hours.    . Hyoscyamine Sulfate SL (LEVSIN/SL) 0.125 MG SUBL Place 1 each under the tongue 3 (three) times daily as needed. 15 tablet 0   No facility-administered medications prior to visit.    ROS Review of Systems  Constitutional: Positive for fatigue. Negative for appetite change, diaphoresis and unexpected weight change.  HENT: Negative.  Negative for trouble swallowing.   Eyes: Negative for visual disturbance.  Respiratory: Negative for cough, chest tightness, shortness of breath and  wheezing.   Cardiovascular: Negative for chest pain, palpitations and leg swelling.  Gastrointestinal: Negative for abdominal pain, constipation, diarrhea, nausea and vomiting.  Genitourinary: Negative.  Negative for difficulty urinating and hematuria.  Musculoskeletal: Negative.  Negative for myalgias.  Skin: Negative.   Neurological: Negative.  Negative for dizziness, weakness, light-headedness and numbness.  Hematological: Negative for adenopathy. Does not bruise/bleed easily.  Psychiatric/Behavioral: Positive for agitation, dysphoric mood and sleep disturbance. Negative for behavioral problems, confusion, decreased concentration,  self-injury and suicidal ideas. The patient is nervous/anxious. The patient is not hyperactive.     Objective:  BP 138/74 (BP Location: Left Arm, Patient Position: Sitting, Cuff Size: Large)   Pulse 65   Temp 98.8 F (37.1 C) (Oral)   Resp 16   Ht _0  (1.702 m)   Wt 165 lb 6 oz (75 kg)   SpO2 98%   BMI 25.90 kg/m   BP Readings from Last 3 Encounters:  08/12/19 138/74  07/22/19 130/72  06/23/19 (!) 108/52    Wt Readings from Last 3 Encounters:  08/12/19 165 lb 6 oz (75 kg)  07/22/19 167 lb (75.8 kg)  06/23/19 165 lb 12.8 oz (75.2 kg)    Physical Exam Vitals reviewed.  Constitutional:      Appearance: Normal appearance.  HENT:     Nose: Nose normal.     Mouth/Throat:     Mouth: Mucous membranes are moist.  Eyes:     General: No scleral icterus.    Conjunctiva/sclera: Conjunctivae normal.  Cardiovascular:     Rate and Rhythm: Normal rate and regular rhythm.     Heart sounds: No murmur heard.   Pulmonary:     Effort: Pulmonary effort is normal.     Breath sounds: No stridor. No wheezing, rhonchi or rales.  Abdominal:     General: Abdomen is protuberant. Bowel sounds are normal. There is no distension.     Palpations: Abdomen is soft. There is no hepatomegaly, splenomegaly or mass.     Tenderness: There is no abdominal tenderness.  Musculoskeletal:        General: Normal range of motion.     Cervical back: Neck supple.     Right lower leg: No edema.     Left lower leg: No edema.  Skin:    General: Skin is warm and dry.     Coloration: Skin is not pale.     Findings: Rash present. No ecchymosis. Rash is urticarial. Rash is not macular, nodular, papular, purpuric or vesicular.     Comments: Blanching wheals noted on BUE's  Neurological:     General: No focal deficit present.     Mental Status: She is alert.  Psychiatric:        Attention and Perception: She is inattentive.        Mood and Affect: Mood is anxious and depressed. Affect is angry and tearful.         Speech: Speech normal. Speech is not rapid and pressured, delayed, slurred or tangential.        Behavior: Behavior normal. Behavior is not agitated, slowed, aggressive, withdrawn or hyperactive. Behavior is cooperative.        Thought Content: Thought content normal. Thought content is not paranoid or delusional. Thought content does not include homicidal or suicidal ideation.        Cognition and Memory: Cognition normal.        Judgment: Judgment normal.     Lab Results  Component Value Date  WBC 7.1 05/14/2019   HGB 13.8 05/14/2019   HCT 42.4 05/14/2019   PLT 151 05/14/2019   GLUCOSE 96 07/22/2019   CHOL 207 (H) 01/27/2019   TRIG 201.0 (H) 07/22/2019   HDL 36.90 (L) 01/27/2019   LDLDIRECT 131.0 01/27/2019   LDLCALC 149 (H) 04/10/2016   ALT 36 (H) 07/22/2019   AST 31 07/22/2019   NA 138 07/22/2019   K 3.9 07/22/2019   CL 104 07/22/2019   CREATININE 0.65 07/22/2019   BUN 11 07/22/2019   CO2 29 07/22/2019   TSH 0.04 (L) 07/22/2019   INR 1.0 01/27/2019   HGBA1C 8.8 (H) 07/22/2019   MICROALBUR <0.7 01/27/2019    DG Chest 2 View  Result Date: 05/14/2019 CLINICAL DATA:  Chest pain. EXAM: CHEST - 2 VIEW COMPARISON:  January 28, 2019. FINDINGS: The heart size and mediastinal contours are within normal limits. Both lungs are clear. No pneumothorax or pleural effusion is noted. The visualized skeletal structures are unremarkable. IMPRESSION: No active cardiopulmonary disease. Electronically Signed   By: Marijo Conception M.D.   On: 05/14/2019 15:31    Assessment & Plan:   Anaija was seen today for depression.  Diagnoses and all orders for this visit:  Severe episode of recurrent major depressive disorder, without psychotic features ([place])- I am concerned her current antidepressant regimen is either not effective or may be too stimulating.  I have asked her to taper off Wellbutrin and sertraline over the next few months.  During that time will layer vortioxetine on top of  the regimen.  May also consider adding an atypical antipsychotic like Rexulti in the near future. -     vortioxetine HBr (TRINTELLIX) 5 MG TABS tablet; Take 1 tablet (5 mg total) by mouth daily for 7 days. -     vortioxetine HBr (TRINTELLIX) 10 MG TABS tablet; Take 1 tablet (10 mg total) by mouth daily for 28 days.  Type II diabetes mellitus with manifestations (HCC) -     Continuous Blood Gluc Receiver (FREESTYLE LIBRE 14 DAY READER) DEVI; 1 Act by Does not apply route daily. -     Continuous Blood Gluc Sensor (FREESTYLE LIBRE 14 DAY SENSOR) MISC; 1 Act by Does not apply route daily. -     Blood Glucose Monitoring Suppl (ACCU-CHEK GUIDE ME) w/Device KIT; 1 Act by Does not apply route 3 (three) times daily as needed. -     Discontinue: vortioxetine HBr (TRINTELLIX) 5 MG TABS tablet; Take 1 tablet (5 mg total) by mouth daily for 7 days. -     Discontinue: vortioxetine HBr (TRINTELLIX) 10 MG TABS tablet; Take 1 tablet (10 mg total) by mouth daily for 28 days.  Urticaria, acute -     hydrOXYzine (ATARAX/VISTARIL) 25 MG tablet; Take 1 tablet (25 mg total) by mouth every 8 (eight) hours as needed.   I have discontinued Cephus Shelling. Funari "Kim"'s sertraline, buPROPion, Hyoscyamine Sulfate SL, clindamycin, and amoxicillin-clavulanate. I am also having her start on Accu-Chek Guide Me and hydrOXYzine. Additionally, I am having her maintain her Narcan, aspirin EC, divalproex, ALPRAZolam, omeprazole, ezetimibe, omega-3 acid ethyl esters, ibuprofen, chlorhexidine, pioglitazone, Rybelsus, levothyroxine, FreeStyle Libre 14 Day Reader, FreeStyle Libre 14 Day Sensor, vortioxetine HBr, and vortioxetine HBr.  Meds ordered this encounter  Medications  . Continuous Blood Gluc Receiver (FREESTYLE LIBRE 14 DAY READER) DEVI    Sig: 1 Act by Does not apply route daily.    Dispense:  6 each    Refill:  5  .  Continuous Blood Gluc Sensor (FREESTYLE LIBRE 14 DAY SENSOR) MISC    Sig: 1 Act by Does not apply route daily.     Dispense:  6 each    Refill:  5  . Blood Glucose Monitoring Suppl (ACCU-CHEK GUIDE ME) w/Device KIT    Sig: 1 Act by Does not apply route 3 (three) times daily as needed.    Dispense:  1 kit    Refill:  0  . DISCONTD: vortioxetine HBr (TRINTELLIX) 5 MG TABS tablet    Sig: Take 1 tablet (5 mg total) by mouth daily for 7 days.    Dispense:  7 tablet    Refill:  0  . DISCONTD: vortioxetine HBr (TRINTELLIX) 10 MG TABS tablet    Sig: Take 1 tablet (10 mg total) by mouth daily for 28 days.    Dispense:  28 tablet    Refill:  0  . vortioxetine HBr (TRINTELLIX) 5 MG TABS tablet    Sig: Take 1 tablet (5 mg total) by mouth daily for 7 days.    Dispense:  7 tablet    Refill:  0  . vortioxetine HBr (TRINTELLIX) 10 MG TABS tablet    Sig: Take 1 tablet (10 mg total) by mouth daily for 28 days.    Dispense:  28 tablet    Refill:  0  . hydrOXYzine (ATARAX/VISTARIL) 25 MG tablet    Sig: Take 1 tablet (25 mg total) by mouth every 8 (eight) hours as needed.    Dispense:  50 tablet    Refill:  1     Follow-up: Return in about 2 months (around 10/13/2019).  [name], MD

## 2019-08-12 NOTE — Patient Instructions (Signed)
Major Depressive Disorder, Adult Major depressive disorder (MDD) is a mental health condition. It may also be called clinical depression or unipolar depression. MDD usually causes feelings of sadness, hopelessness, or helplessness. MDD can also cause physical symptoms. It can interfere with work, school, relationships, and other everyday activities. MDD may be mild, moderate, or severe. It may occur once (single episode major depressive disorder) or it may occur multiple times (recurrent major depressive disorder). What are the causes? The exact cause of this condition is not known. MDD is most likely caused by a combination of things, which may include:  Genetic factors. These are traits that are passed along from parent to child.  Individual factors. Your personality, your behavior, and the way you handle your thoughts and feelings may contribute to MDD. This includes personality traits and behaviors learned from others.  Physical factors, such as: ? Differences in the part of your brain that controls emotion. This part of your brain may be different than it is in people who do not have MDD. ? Long-term (chronic) medical or psychiatric illnesses.  Social factors. Traumatic experiences or major life changes may play a role in the development of MDD. What increases the risk? This condition is more likely to develop in women. The following factors may also make you more likely to develop MDD:  A family history of depression.  Troubled family relationships.  Abnormally low levels of certain brain chemicals.  Traumatic events in childhood, especially abuse or the loss of a parent.  Being under a lot of stress, or long-term stress, especially from upsetting life experiences or losses.  A history of: ? Chronic physical illness. ? Other mental health disorders. ? Substance abuse.  Poor living conditions.  Experiencing social exclusion or discrimination on a regular basis. What are the  signs or symptoms? The main symptoms of MDD typically include:  Constant depressed or irritable mood.  Loss of interest in things and activities. MDD symptoms may also include:  Sleeping or eating too much or too little.  Unexplained weight change.  Fatigue or low energy.  Feelings of worthlessness or guilt.  Difficulty thinking clearly or making decisions.  Thoughts of suicide or of harming others.  Physical agitation or weakness.  Isolation. Severe cases of MDD may also occur with other symptoms, such as:  Delusions or hallucinations, in which you imagine things that are not real (psychotic depression).  Low-level depression that lasts at least a year (chronic depression or persistent depressive disorder).  Extreme sadness and hopelessness (melancholic depression).  Trouble speaking and moving (catatonic depression). How is this diagnosed? This condition may be diagnosed based on:  Your symptoms.  Your medical history, including your mental health history. This may involve tests to evaluate your mental health. You may be asked questions about your lifestyle, including any drug and alcohol use, and how long you have had symptoms of MDD.  A physical exam.  Blood tests to rule out other conditions. You must have a depressed mood and at least four other MDD symptoms most of the day, nearly every day in the same 2-week timeframe before your health care provider can confirm a diagnosis of MDD. How is this treated? This condition is usually treated by mental health professionals, such as psychologists, psychiatrists, and clinical social workers. You may need more than one type of treatment. Treatment may include:  Psychotherapy. This is also called talk therapy or counseling. Types of psychotherapy include: ? Cognitive behavioral therapy (CBT). This type of therapy   teaches you to recognize unhealthy feelings, thoughts, and behaviors, and replace them with positive thoughts  and actions. ? Interpersonal therapy (IPT). This helps you to improve the way you relate to and communicate with others. ? Family therapy. This treatment includes members of your family.  Medicine to treat anxiety and depression, or to help you control certain emotions and behaviors.  Lifestyle changes, such as: ? Limiting alcohol and drug use. ? Exercising regularly. ? Getting plenty of sleep. ? Making healthy eating choices. ? Spending more time outdoors.  Treatments involving stimulation of the brain can be used in situations with extremely severe symptoms, or when medicine or other therapies do not work over time. These treatments include electroconvulsive therapy, transcranial magnetic stimulation, and vagal nerve stimulation. Follow these instructions at home: Activity  Return to your normal activities as told by your health care provider.  Exercise regularly and spend time outdoors as told by your health care provider. General instructions  Take over-the-counter and prescription medicines only as told by your health care provider.  Do not drink alcohol. If you drink alcohol, limit your alcohol intake to no more than 1 drink a day for nonpregnant women and 2 drinks a day for men. One drink equals 12 oz of beer, 5 oz of wine, or 1 oz of hard liquor. Alcohol can affect any antidepressant medicines you are taking. Talk to your health care provider about your alcohol use.  Eat a healthy diet and get plenty of sleep.  Find activities that you enjoy doing, and make time to do them.  Consider joining a support group. Your health care provider may be able to recommend a support group.  Keep all follow-up visits as told by your health care provider. This is important. Where to find more information National Alliance on Mental Illness  www.nami.org [place] National Institute of Mental Health  www.nimh.nih.gov National Suicide Prevention Lifeline  1-800-273-TALK (8255). This is  free, 24-hour help. Contact a health care provider if:  Your symptoms get worse.  You develop new symptoms. Get help right away if:  You self-harm.  You have serious thoughts about hurting yourself or others.  You see, hear, taste, smell, or feel things that are not present (hallucinate). This information is not intended to replace advice given to you by your health care provider. Make sure you discuss any questions you have with your health care provider. Document Revised: 01/05/2017 Document Reviewed: 08/04/2015 Elsevier Patient Education  2020 Elsevier Inc.  

## 2019-08-14 ENCOUNTER — Ambulatory Visit: Payer: Self-pay | Admitting: *Deleted

## 2019-08-18 ENCOUNTER — Other Ambulatory Visit: Payer: Self-pay | Admitting: *Deleted

## 2019-08-18 NOTE — Patient Outreach (Signed)
[facility]) Care Management  08/18/2019  Tasha [name] [facility] 01/16/1961 [id number]   Subjective: Telephone call to patient's home  / mobile number, no answer, left HIPAA compliant voicemail message, and requested call back.    Objective: Per KPN (Knowledge Performance Now, point of care tool) and chart review, patient has not had any recent hospitalizations.   Patient had a ED visit on 05/14/2019 for chest pain.   Patient also has a history of diabetes, hypothyroidism, stroke, asthma, endometriosis, IBS, Nonalcoholic steatohepatitis (NASH), vitamin D deficiency, hypertriglyceridemia,  and hyperlipidemia.       Assessment: Received Medicare MD Referral on 07/23/2019.  Referral source: Dr. [name].   Referral reason: poorly controlled DM2 (Type II diabetes mellitus).   Screening follow up in progress, pending additional patient outreach,  and will further assess care management needs.       Plan: RNCM will call patient for 2nd telephone outreach attempt, within 4 business days, telephone screening assessment follow up, and will proceed with case closure, after 4th unsuccessful outreach call.       Azzam Mehra H. Annia Friendly, BSN, [place] [facility] Telephonic CM Phone: [phone] Fax: [phone]

## 2019-08-20 ENCOUNTER — Telehealth: Payer: Self-pay | Admitting: Internal Medicine

## 2019-08-20 ENCOUNTER — Other Ambulatory Visit: Payer: Self-pay | Admitting: Internal Medicine

## 2019-08-20 ENCOUNTER — Ambulatory Visit: Payer: Self-pay | Admitting: *Deleted

## 2019-08-20 DIAGNOSIS — E118 Type 2 diabetes mellitus with unspecified complications: Secondary | ICD-10-CM

## 2019-08-20 MED ORDER — RYBELSUS 7 MG PO TABS: 1.0000 | ORAL_TABLET | Freq: Every day | ORAL | 0 refills | Status: DC

## 2019-08-20 NOTE — Telephone Encounter (Signed)
New message:   Pt is calling and states she would like to know if she can have more samples of the Semaglutide (RYBELSUS) 3 MG TABS. Pt states she will be out of this medication in 4 days. Please advise.

## 2019-08-21 ENCOUNTER — Telehealth: Payer: Self-pay

## 2019-08-21 NOTE — Telephone Encounter (Signed)
Determination for PA states: The patient currently has access to the requested medication and a Prior Authorization is not needed for the patient/medication.

## 2019-08-21 NOTE — Telephone Encounter (Signed)
Key: [id number]  Request for Rybelsus PA was done for 6 months as the dose will be increased to the 38m tablets

## 2019-08-22 ENCOUNTER — Telehealth: Payer: Self-pay | Admitting: Internal Medicine

## 2019-08-22 DIAGNOSIS — E118 Type 2 diabetes mellitus with unspecified complications: Secondary | ICD-10-CM

## 2019-08-22 NOTE — Telephone Encounter (Signed)
New message:   Pt is calling and states she would like to go see Dr. Patel(Endocrinologist) so she is needing a referral. Please advise.

## 2019-08-26 ENCOUNTER — Ambulatory Visit: Payer: Self-pay | Admitting: *Deleted

## 2019-08-27 ENCOUNTER — Telehealth: Payer: Self-pay | Admitting: Internal Medicine

## 2019-08-27 NOTE — Telephone Encounter (Signed)
    Patient's daughter calling to report patient has dark, foul smelling urine. Also requesting alternative for Semaglutide (RYBELSUS) 7 MG TABS. The cost is over $200.  Requesting call from [facility]

## 2019-08-27 NOTE — Telephone Encounter (Signed)
New message:   Pt is calling and would like a call back. She states Tasha [name] called her daughter but she didn't call her. She is wanting to discuss possibly having a UTI. I offered the pt and appt with another provider and she did not want it. Please advise.

## 2019-08-27 NOTE — Telephone Encounter (Signed)
I have spoke to the pt. She stated that she is having a lot of uncomfortable burning while she urinates as well as some dizziness. She believes the dizziness if from her wellbutrin. I have scheduled the pt an OV for tomorrow at 3.40pm with Dr. Jenny Reichmann.

## 2019-08-27 NOTE — Telephone Encounter (Signed)
lvm for Ms. Conrad to call back regarding concerns for pt.

## 2019-08-28 ENCOUNTER — Ambulatory Visit (INDEPENDENT_AMBULATORY_CARE_PROVIDER_SITE_OTHER): Payer: Medicare Other | Admitting: Internal Medicine

## 2019-08-28 ENCOUNTER — Encounter: Payer: Self-pay | Admitting: Internal Medicine

## 2019-08-28 ENCOUNTER — Other Ambulatory Visit: Payer: Self-pay

## 2019-08-28 VITALS — BP 110/60 | HR 69 | Temp 98.6°F | Ht 67.0 in | Wt 165.0 lb

## 2019-08-28 DIAGNOSIS — E118 Type 2 diabetes mellitus with unspecified complications: Secondary | ICD-10-CM | POA: Diagnosis not present

## 2019-08-28 DIAGNOSIS — R300 Dysuria: Secondary | ICD-10-CM

## 2019-08-28 DIAGNOSIS — J4520 Mild intermittent asthma, uncomplicated: Secondary | ICD-10-CM | POA: Diagnosis not present

## 2019-08-28 DIAGNOSIS — I70219 Atherosclerosis of native arteries of extremities with intermittent claudication, unspecified extremity: Secondary | ICD-10-CM

## 2019-08-28 MED ORDER — CIPROFLOXACIN HCL 500 MG PO TABS
500.0000 mg | ORAL_TABLET | Freq: Two times a day (BID) | ORAL | 0 refills | Status: AC
Start: 2019-08-28 — End: 2019-09-07

## 2019-08-28 MED FILL — CIPROFLOXACIN HCL 500 MG TA: 500 | 10 days supply | Qty: 20 | Fill #0

## 2019-08-28 NOTE — Progress Notes (Signed)
Subjective:    Patient ID: Tasha [name], female    DOB: Mar 10, 1960, 59 y.o.   MRN: [id number]  HPI  Here with 2-3 days onset dysuria with off colored urine, odor, and o/w seems to be more off balance, ill feeling with occasional nausea and decreased appetite, but no vomiting and Denies urinary symptoms such as frequency, urgency, flank pain, hematuria.  Pt denies chest pain, increased sob or doe, wheezing, orthopnea, PND, increased LE swelling, palpitations, dizziness or syncope.  Pt denies new neurological symptoms such as new headache, or facial or extremity weakness or numbness   Pt denies polydipsia, polyuria, and has some lower back pain.  CBGs have been in low 100s Past Medical History:  Diagnosis Date  . Allergy   . Anemia    past hx   . Anxiety state, unspecified   . Arthralgia of temporomandibular joint   . Arthritis   . Asthma    as a child , not now per pt   . Conversion disorder   . Dementia ([place])   . Depressive disorder, not elsewhere classified   . Diabetes mellitus without complication ([place])    off all medicines as of 04-16-2019 PV   . Dysphagia, unspecified(787.20)   . Endometriosis   . Esophageal reflux   . Fibromyalgia   . Headache(784.0)   . Heart murmur    past hx murmur   . Hx of adenomatous colonic polyps   . Irritable bowel syndrome   . Mitral valve disorders(424.0)   . Myalgia and myositis, unspecified   . Overweight(278.02)   . Periodic limb movement disorder   . Pure hypercholesterolemia   . RLS (restless legs syndrome)   . Seizures ([place])    last episode 5 yrs ago   . Stroke [facility])    TIA per pt many years ago   . Unspecified hypothyroidism    Past Surgical History:  Procedure Laterality Date  . ABDOMINAL HYSTERECTOMY     TAH  . arm surgery    . COLONOSCOPY  04/14/2008  . FOOT SURGERY    . LIPOMA EXCISION     BESIDES BELLY BUTTON  . POLYPECTOMY     HPP x 1  . TONSILLECTOMY    . UPPER GASTROINTESTINAL ENDOSCOPY  2010   [facility]    . WRIST SURGERY      reports that she has been smoking cigarettes. She has been smoking about 1.00 pack per day. She has never used smokeless tobacco. She reports current alcohol use of about 1.0 standard drink of alcohol per week. She reports that she does not use drugs. family history includes Breast cancer in her maternal aunt; Colon cancer in her paternal aunt; Colon cancer (age of onset: 71) in her father; Diabetes in her father; Heart disease in her father and mother; Lung cancer in her paternal aunt; Rectal cancer in her paternal aunt; Stomach cancer in her paternal aunt and paternal uncle. Allergies  Allergen Reactions  . Wilder Glade [Dapagliflozin] Other (See Comments)    Vag yeast inf  . Metformin And Related Diarrhea  . Statins Other (See Comments)    Muscle weakness/pains  . Augmentin [Amoxicillin-Pot Clavulanate] Other (See Comments)    Patient states she recently reported to dentist and was advised to report to providers as allergy.  States she got blisters under her eyes after taking Augmentin.  . Crestor [Rosuvastatin] Other (See Comments)    Muscle aches  . Lipitor [Atorvastatin] Other (See Comments)    Muscle  aches  . Zocor [Simvastatin] Other (See Comments)    Leg pain per patient/PE  . Ozempic (0.25 Or 0.5 Mg-Dose) [Semaglutide(0.25 Or 0.38m-Dos)] Nausea And Vomiting   Current Outpatient Medications on File Prior to Visit  Medication Sig Dispense Refill  . aspirin EC 81 MG tablet Take 1 tablet (81 mg total) by mouth daily. 90 tablet 1  . Blood Glucose Monitoring Suppl (ACCU-CHEK GUIDE ME) w/Device KIT 1 Act by Does not apply route 3 (three) times daily as needed. 1 kit 0  . chlorhexidine (PERIDEX) 0.12 % solution 15 mLs 2 (two) times daily.    . Continuous Blood Gluc Receiver (FREESTYLE LIBRE 14 DAY READER) DEVI 1 Act by Does not apply route daily. 6 each 5  . Continuous Blood Gluc Sensor (FREESTYLE LIBRE 14 DAY SENSOR) MISC 1 Act by Does not apply route daily. 6 each 5   . divalproex (DEPAKOTE) 500 MG DR tablet Take 1 tablet (500 mg total) by mouth 2 (two) times daily. 60 tablet 4  . ezetimibe (ZETIA) 10 MG tablet Take 1 tablet (10 mg total) by mouth daily. 90 tablet 1  . hydrOXYzine (ATARAX/VISTARIL) 25 MG tablet Take 1 tablet (25 mg total) by mouth every 8 (eight) hours as needed. 50 tablet 1  . ibuprofen (ADVIL) 800 MG tablet Take 800 mg by mouth every 8 (eight) hours as needed.    .Marland KitchenNARCAN 4 MG/0.1ML LIQD nasal spray kit   2  . omega-3 acid ethyl esters (LOVAZA) 1 g capsule Take 2 capsules (2 g total) by mouth 2 (two) times daily. 360 capsule 1  . omeprazole (PRILOSEC) 40 MG capsule TAKE 2 CAPSULES BY MOUTH ONCE A DAY 180 capsule 1  . pioglitazone (ACTOS) 15 MG tablet Take 1 tablet (15 mg total) by mouth daily. 90 tablet 1  . Semaglutide (RYBELSUS) 7 MG TABS Take 1 tablet by mouth daily. 90 tablet 0  . vortioxetine HBr (TRINTELLIX) 10 MG TABS tablet Take 1 tablet (10 mg total) by mouth daily for 28 days. 28 tablet 0   No current facility-administered medications on file prior to visit.   Review of Systems All otherwise neg per pt    Objective:   Physical Exam BP (!) 110/60 (BP Location: Left Arm, Patient Position: Sitting, Cuff Size: Large)   Pulse 69   Temp 98.6 F (37 C) (Oral)   Ht _0  (1.702 m)   Wt 165 lb (74.8 kg)   SpO2 97%   BMI 25.84 kg/m  VS noted,  Constitutional: Pt appears in NAD HENT: Head: NCAT.  Right Ear: External ear normal.  Left Ear: External ear normal.  Eyes: . Pupils are equal, round, and reactive to light. Conjunctivae and EOM are normal Nose: without d/c or deformity Neck: Neck supple. Gross normal ROM Cardiovascular: Normal rate and regular rhythm.   Pulmonary/Chest: Effort normal and breath sounds without rales or wheezing.  Abd:  Soft, NT, ND, + BS, no organomegaly Neurological: Pt is alert. At baseline orientation, motor grossly intact Skin: Skin is warm. No rashes, other new lesions, no LE edema  Psychiatric: Pt behavior is normal without agitation  All otherwise neg per pt Lab Results  Component Value Date   WBC 7.1 05/14/2019   HGB 13.8 05/14/2019   HCT 42.4 05/14/2019   PLT 151 05/14/2019   GLUCOSE 96 07/22/2019   CHOL 207 (H) 01/27/2019   TRIG 201.0 (H) 07/22/2019   HDL 36.90 (L) 01/27/2019   LDLDIRECT 131.0 01/27/2019   [place]  149 (H) 04/10/2016   ALT 36 (H) 07/22/2019   AST 31 07/22/2019   NA 138 07/22/2019   K 3.9 07/22/2019   CL 104 07/22/2019   CREATININE 0.65 07/22/2019   BUN 11 07/22/2019   CO2 29 07/22/2019   TSH 0.04 (L) 07/22/2019   INR 1.0 01/27/2019   HGBA1C 8.8 (H) 07/22/2019   MICROALBUR <0.7 01/27/2019      Assessment & Plan:

## 2019-08-28 NOTE — Patient Instructions (Signed)
Please take all new medication as prescribed - the antibiotic  Please continue all other medications as before, and refills have been done if requested.  Please have the pharmacy call with any other refills you may need.  Please keep your appointments with your specialists as you may have planned  Please go to the LAB at the blood drawing area for the tests to be done - just the urine testing today  You will be contacted by phone if any changes need to be made immediately.  Otherwise, you will receive a letter about your results with an explanation, but please check with MyChart first.  Please remember to sign up for MyChart if you have not done so, as this will be important to you in the future with finding out test results, communicating by private email, and scheduling acute appointments online when needed.

## 2019-08-29 ENCOUNTER — Other Ambulatory Visit: Payer: Self-pay | Admitting: Internal Medicine

## 2019-08-29 DIAGNOSIS — F418 Other specified anxiety disorders: Secondary | ICD-10-CM

## 2019-08-29 DIAGNOSIS — E039 Hypothyroidism, unspecified: Secondary | ICD-10-CM

## 2019-08-29 LAB — URINALYSIS, ROUTINE W REFLEX MICROSCOPIC
Bilirubin Urine: NEGATIVE
Glucose, UA: NEGATIVE
Hgb urine dipstick: NEGATIVE
Ketones, ur: NEGATIVE
Leukocytes,Ua: NEGATIVE
Nitrite: NEGATIVE
Protein, ur: NEGATIVE
Specific Gravity, Urine: 1.016 (ref 1.001–1.03)
pH: 5.5 (ref 5.0–8.0)

## 2019-08-29 LAB — URINE CULTURE

## 2019-08-29 MED FILL — SYNTHROID 150 MCG TABLET: 150 | 90 days supply | Qty: 90 | Fill #0

## 2019-08-29 MED FILL — ALPRAZOLAM 1 MG TABS: 1 | 30 days supply | Qty: 90 | Fill #0

## 2019-08-30 ENCOUNTER — Encounter: Payer: Self-pay | Admitting: Internal Medicine

## 2019-08-30 DIAGNOSIS — R300 Dysuria: Secondary | ICD-10-CM | POA: Insufficient documentation

## 2019-08-30 NOTE — Assessment & Plan Note (Signed)
stable overall by history and exam, recent data reviewed with pt, and pt to continue medical treatment as before,  to f/u any worsening symptoms or concerns  

## 2019-08-30 NOTE — Assessment & Plan Note (Signed)
Mild to mod, for empiric antibx course after urine studies,  to f/u any worsening symptoms or concerns

## 2019-09-01 ENCOUNTER — Ambulatory Visit: Payer: Self-pay | Admitting: *Deleted

## 2019-09-03 MED FILL — OMEPRAZOLE 40 MG CPDR: 40 | 90 days supply | Qty: 180 | Fill #0

## 2019-09-09 ENCOUNTER — Ambulatory Visit: Payer: Self-pay | Admitting: *Deleted

## 2019-09-10 MED FILL — RYBELSUS 7 MG TABS: 7 | 30 days supply | Qty: 30 | Fill #0

## 2019-09-16 ENCOUNTER — Ambulatory Visit: Payer: Self-pay | Admitting: *Deleted

## 2019-09-19 ENCOUNTER — Telehealth: Payer: Self-pay | Admitting: Internal Medicine

## 2019-09-19 ENCOUNTER — Encounter: Payer: Self-pay | Admitting: *Deleted

## 2019-09-19 ENCOUNTER — Other Ambulatory Visit: Payer: Self-pay | Admitting: *Deleted

## 2019-09-19 DIAGNOSIS — F418 Other specified anxiety disorders: Secondary | ICD-10-CM

## 2019-09-19 MED ORDER — VORTIOXETINE HBR 10 MG PO TABS: 10.0000 mg | ORAL_TABLET | Freq: Every day | ORAL | 0 refills | Status: DC

## 2019-09-19 NOTE — Telephone Encounter (Signed)
New message:   Pt is calling to see if she needs to come and get more samples of Trintellix 10 mg or stop taking the medication after the 28 days. Please advise.

## 2019-09-19 NOTE — Telephone Encounter (Signed)
Follow up message  Patient wants to know if samples are available or can Trintellix be sent to [facility]

## 2019-09-19 NOTE — Telephone Encounter (Signed)
Called pt back and she didn't know if she was to keep taking it. I asked pt if she wanted to keep taking it. Pt didn't know and said that she only has two pills left. I reviewed the medication list and was not able to find the rx at first.   The rx expired because we were waiting on a call or message to let us know if she wanted to continue or if she wanted something else.   I am pulling samples for pt.

## 2019-09-19 NOTE — Telephone Encounter (Signed)
Pt's husband picked up samples.

## 2019-09-19 NOTE — Telephone Encounter (Signed)
Pt contacted. There is a telephone note regarding same.

## 2019-09-19 NOTE — Patient Outreach (Signed)
[place] [facility]) Care Management  09/19/2019  Tasha [name] [facility] 1961-02-01 [id number]   Contact made with member to follow up on assessment of needs per MD request for help managing diabetes.   Identity verified.  This care manager introduced self and stated purpose of call.  [facility] care management services explained.    Social: Report living with husband but is independent in ADL's.  State her daughter is a Marine scientist and helps her manager her medical conditions.  Although she is able to walk and denies use of DME, state it is painful for her to walk long distances due to neuropathy.  She has tried Gabapentin, state she has side effects while taking it, it made her "crazy.  Conditions: Per chart, has history of migraine, GERD, IBS, Hypothyroidism, DM, HLD, and depression with anxiety.  Last A1C in June was 8.8, report she has been checking blood sugars daily, range usually in the 200s.  State she is supposed to be receiving a Freestyle Libre so she won't have to stick her finger as often.  Report she has started to monitor her diet more, eating more vegetables and water, weakness has been bread.    Medications: Reviewed with member, state she is able to manage them with the help of her daughter who is a Marine scientist.  Report she was placed on a new medication for depression/anxiety (Trintellix) and it will be costly.  She has received samples of medication for now.  Wasn't sure if she should continue taking as she didn't have a refill and only had another day's worth.  Call was placed to PCP office to clarify, new samples were given.  She is concerned about the cost of other medications as well, state she only receives $800 a month.  Agrees to speak with [facility] pharmacy team about financial assistance options.  Expresses frustration regarding medication management of diabetes, state she was taken off Metformin in November but wasn't placed on anything else.  A1C was less than 7 until this past June.   She is now taking Actos.  Appointments: Was last seen in PCP office on 7/22 for UTI, was placed on antibiotics.  Will have follow up on 9/8.  Denies need for transportation assistance.  She has requested a referral to endocrinologist Dr. Posey Pronto to help wit management of diabetes as well as hypothyroidism.  Plan: RN CM will place referral to [place] team for concern regarding cost of medications.  Will also place referral to delegated [facility].  Goals Addressed              This Visit's Progress   .  Manage diabetes, decrease A1C (pt-stated)        CARE PLAN ENTRY (see longtitudinal plan of care for additional care plan information)  Objective:  Lab Results  Component Value Date   HGBA1C 8.8 (H) 07/22/2019 .   Lab Results  Component Value Date   CREATININE 0.65 07/22/2019   CREATININE 0.73 05/14/2019   CREATININE 0.96 04/10/2019 .   Marland Kitchen No results found for: EGFR  Current Barriers:  Marland Kitchen Knowledge Deficits related to medications used for management of diabetes . Difficulty obtaining or cannot afford medications  Case Manager Clinical Goal(s):  Over the next 90 days, patient will demonstrate improved adherence to prescribed treatment plan for diabetes self care/management as evidenced by: decreased A1C </=7 . Verbalize daily monitoring and recording of CBG within 28 days . Verbalize adherence to ADA/ carb modified diet within the next 28  days . Verbalize adherence to prescribed medication regimen within the next 28 days   Interventions:  . Provided education to patient about basic DM disease process . Discussed plans with patient for ongoing care management follow up and provided patient with direct contact information for care management team . Reviewed scheduled/upcoming provider appointments including: PCP and endocrinology  Patient Self Care Activities:  . Self administers oral medications as prescribed . Attends all scheduled provider appointments . Checks blood  sugars as prescribed and utilize hyper and hypoglycemia protocol as needed  Initial goal documentation       Valente David, RN, MSN [place] Manager [phone]

## 2019-09-22 NOTE — Patient Outreach (Signed)
Referral from [facility], RN to [email or web address] for Medication Assistance.

## 2019-09-24 ENCOUNTER — Ambulatory Visit: Payer: Self-pay | Admitting: *Deleted

## 2019-09-25 ENCOUNTER — Other Ambulatory Visit: Payer: Self-pay

## 2019-09-25 NOTE — Patient Outreach (Signed)
[place] [facility]) Care Management  09/25/2019  [place] Jan 05, 1961 [id number]   Telephone call to patient for initial disease management call. No answer.  HIPAA compliant voice message left.    Plan: RN CM will attempt patient again within the next 10 business days and send letter.  Jone Baseman, RN, MSN [place] Management Care Management Coordinator Direct Line [phone] Cell [phone] Toll Free: [phone]  Fax: [phone]

## 2019-10-02 ENCOUNTER — Other Ambulatory Visit: Payer: Self-pay

## 2019-10-02 NOTE — Patient Outreach (Signed)
[place] [facility]) Care Management  [place]  10/02/2019   Jerolyn Flenniken [facility] 1960/02/16 [id number]  Subjective: Telephone call to patient for initial assessment. Patient A1c currently 8.8.  Patient uses [employer] but has not put it on.  Encouraged patient to monitor sugar and monitor her diet. Patient having some problems with depression and anxiety.  She is taking Wellbutrin and trintellix.  She states that MD told her not to take the Wellbutrin but feels she cannot do without it. She states she has notified the doctor and no changes made at this time.  Discussed with patient behavioral assistance through [facility] for her depression and anxiety. She is agreeable.   Patient lives in the home with spouse.  Patient states she is independent with all care and still drives.   Discussed [facility] services. Patient agreeable to disease management outreach.    Objective:   Encounter Medications:  Outpatient Encounter Medications as of 10/02/2019  Medication Sig Note  . ALPRAZolam (XANAX) 1 MG tablet TAKE 1 TABLET BY MOUTH THREE TIMES DAILY AS NEEDED FOR ANXIETY   . aspirin EC 81 MG tablet Take 1 tablet (81 mg total) by mouth daily.   . Blood Glucose Monitoring Suppl (ACCU-CHEK GUIDE ME) w/Device KIT 1 Act by Does not apply route 3 (three) times daily as needed.   . Continuous Blood Gluc Receiver (FREESTYLE LIBRE 14 DAY READER) DEVI 1 Act by Does not apply route daily.   . Continuous Blood Gluc Sensor (FREESTYLE LIBRE 14 DAY SENSOR) MISC 1 Act by Does not apply route daily.   Marland Kitchen ezetimibe (ZETIA) 10 MG tablet Take 1 tablet (10 mg total) by mouth daily.   Marland Kitchen ibuprofen (ADVIL) 800 MG tablet Take 800 mg by mouth every 8 (eight) hours as needed.   Marland Kitchen NARCAN 4 MG/0.1ML LIQD nasal spray kit  11/07/2018: As needed  . omega-3 acid ethyl esters (LOVAZA) 1 g capsule Take 2 capsules (2 g total) by mouth 2 (two) times daily.   Marland Kitchen omeprazole (PRILOSEC) 40 MG capsule TAKE 2 CAPSULES BY MOUTH  ONCE A DAY   . pioglitazone (ACTOS) 15 MG tablet Take 1 tablet (15 mg total) by mouth daily.   . Semaglutide (RYBELSUS) 7 MG TABS Take 1 tablet by mouth daily.   Marland Kitchen SYNTHROID 150 MCG tablet TAKE 1 TABLET BY MOUTH DAILY.   Marland Kitchen vortioxetine HBr (TRINTELLIX) 10 MG TABS tablet Take 1 tablet (10 mg total) by mouth daily.   . chlorhexidine (PERIDEX) 0.12 % solution 15 mLs 2 (two) times daily. (Patient not taking: Reported on 10/02/2019)   . divalproex (DEPAKOTE) 500 MG DR tablet Take 1 tablet (500 mg total) by mouth 2 (two) times daily. (Patient not taking: Reported on 10/02/2019)   . hydrOXYzine (ATARAX/VISTARIL) 25 MG tablet Take 1 tablet (25 mg total) by mouth every 8 (eight) hours as needed. (Patient not taking: Reported on 10/02/2019)    No facility-administered encounter medications on file as of 10/02/2019.    Functional Status:  In your present state of health, do you have any difficulty performing the following activities: 10/02/2019  Hearing? N  Vision? N  Difficulty concentrating or making decisions? Y  Comment forgetful at times  Walking or climbing stairs? N  Dressing or bathing? N  Doing errands, shopping? N  Preparing Food and eating ? N  Using the Toilet? N  In the past six months, have you accidently leaked urine? N  Do you have problems with loss of bowel control?  N  Managing your Medications? N  Managing your Finances? N  Housekeeping or managing your Housekeeping? N  Some recent data might be hidden    Fall/Depression Screening: Fall Risk  10/02/2019 09/19/2019 08/05/2019  Falls in the past year? _0 Number falls in past yr: 0 0 0  Injury with Fall? _1 Risk for fall due to : History of fall(s) History of fall(s) History of fall(s)  Follow up Falls prevention discussed Falls evaluation completed;Falls prevention discussed Falls evaluation completed;Education provided   [facility] 2/9 Scores 10/02/2019 07/22/2019 04/11/2019 07/25/2018 01/17/2018 09/03/2017 03/19/2017  PHQ - 2 Score  _2 0 0 0 5  PHQ- 9 Score _3 0 - 22   SDOH Screenings   Alcohol Screen:   . Last Alcohol Screening Score (AUDIT): Not on file  Depression (PHQ2-9): Medium Risk  . PHQ-2 Score: 13  Financial Resource Strain:   . Difficulty of Paying Living Expenses: Not on file  Food Insecurity:   . Worried About Charity fundraiser in the Last Year: Not on file  . Ran Out of Food in the Last Year: Not on file  Housing: Low Risk   . Last Housing Risk Score: 0  Physical Activity:   . Days of Exercise per Week: Not on file  . Minutes of Exercise per Session: Not on file  Social Connections:   . Frequency of Communication with Friends and Family: Not on file  . Frequency of Social Gatherings with Friends and Family: Not on file  . Attends Religious Services: Not on file  . Active Member of Clubs or Organizations: Not on file  . Attends Archivist Meetings: Not on file  . Marital Status: Not on file  Stress:   . Feeling of Stress : Not on file  Tobacco Use: High Risk  . Smoking Tobacco Use: Current Every Day Smoker  . Smokeless Tobacco Use: Never Used  Transportation Needs: No Transportation Needs  . Lack of Transportation (Medical): No  . Lack of Transportation (Non-Medical): No     Assessment: Patient voices readiness to control diabetes.  Patient agreeable to disease management follow up.     Plan:  [facility] CM Care Plan Problem One     Most Recent Value  Care Plan Problem One Knowledge Deficit Diabetes  Role Documenting the Problem One Care Management Telephonic Coordinator  Care Plan for Problem One Active  [facility] Long Term Goal  Over the next 90 days, patient will demonstrate and/or verbalize understanding of self-health management for long term care of Diabetes.  [facility] Long Term Goal Start Date 10/02/19  Interventions for Problem One Long Term Goal RN CM reviewed with patient sugar control, importance of monitoring sugars, and importance of diet and exercise.     RN CM  will provide ongoing education and support to patient through phone calls.   RN CM will send welcome packet with consent to patient.   RN CM will send initial barriers letter, assessment, and care plan to primary care physician.   RN CM will contact patient next month and patient agrees to next contact.   Jone Baseman, RN, MSN [place] Management Care Management Coordinator Direct Line [phone] Cell [phone] Toll Free: [phone]  Fax: [phone]

## 2019-10-03 ENCOUNTER — Telehealth: Payer: Self-pay | Admitting: Pharmacist

## 2019-10-03 ENCOUNTER — Encounter: Payer: Self-pay | Admitting: Pharmacist

## 2019-10-03 NOTE — Progress Notes (Signed)
  Chronic Care Management   Outreach Note  10/03/2019 Name: Tasha [name] MRN: [id number] DOB: 1960-11-23  Referred by: Janith Lima, MD Reason for referral : High medication costs  An unsuccessful telephone outreach was attempted today. The patient was referred to the pharmacist for assistance with care management and care coordination.   Patient may return my call at: [place], PharmD, BCACP Clinical Pharmacist [place] [phone]

## 2019-10-06 ENCOUNTER — Telehealth: Payer: Self-pay | Admitting: Internal Medicine

## 2019-10-06 NOTE — Telephone Encounter (Signed)
   Pt c/o medication issue:  1. Name of Medication: vortioxetine HBr (TRINTELLIX) 10 MG TABS tablet  2. How are you currently taking this medication (dosage and times per day)? As written  3. Are you having a reaction (difficulty breathing--STAT)? no  4. What is your medication issue? Felt suicidal over the weekend, thinks medication may be the issue.  Patient states she doesn't feel suicidal today. Declined to speak with [facility] [employer] Patient also requesting to speak with Regan Lemming, call transferred

## 2019-10-06 NOTE — Progress Notes (Signed)
Spoke with patient regarding medication issues.  1. Pt felt suicidal on Saturday, it was the 2-year anniversary of her father's death. She got through it by thinking of her granddaughters. Pt reports "it has never been this bad before" and is concerned about Trintellix which she started about 6 weeks ago. She says she is also taking bupropion 150 mg (although this was removed from her med list 08/12/19 when Trintellix was started). Pt denies SI today but is tearful.   Plan: Patient has follow with PCP in 2 days, recommended to continue same meds until then. Both Trintellix and Bupropion are at doses low enough to discontinue without tapering.  2. Freestyle Libre issues - pt received sensors from [employer]. She reports she received 14-day sensors and Libre 2 sensors, which were incompatible with the app she uses per patient's conversation with [employer] representative. It is my understanding that both Freestyle Libre 14-day sensors and Libre 2 sensors function with their own smartphone apps.   Plan: pt will bring all her Freestyle Elenor Legato materials to her appt in 2 days and meet with pharmacist to determine smartphone compatibility   3. High prescription costs - pt spoke with [employer] last week and brand name meds were reduced to $43 copay, which is reasonable for patient. No further assistance required.

## 2019-10-07 MED FILL — EZETIMIBE 10 MG TABS: 10 | 90 days supply | Qty: 90 | Fill #0

## 2019-10-07 MED FILL — HYDROXYZINE HCL 25 MG TABS: 25 | 16 days supply | Qty: 50 | Fill #0

## 2019-10-07 MED FILL — ALPRAZOLAM 1 MG TABS: 1 | 30 days supply | Qty: 90 | Fill #1

## 2019-10-08 ENCOUNTER — Encounter: Payer: Self-pay | Admitting: Internal Medicine

## 2019-10-08 ENCOUNTER — Ambulatory Visit (INDEPENDENT_AMBULATORY_CARE_PROVIDER_SITE_OTHER): Payer: Medicare HMO | Admitting: Internal Medicine

## 2019-10-08 ENCOUNTER — Other Ambulatory Visit: Payer: Self-pay

## 2019-10-08 ENCOUNTER — Telehealth: Payer: Self-pay | Admitting: Pharmacist

## 2019-10-08 VITALS — BP 124/76 | HR 66 | Temp 98.2°F | Ht 67.0 in | Wt 156.0 lb

## 2019-10-08 DIAGNOSIS — E781 Pure hyperglyceridemia: Secondary | ICD-10-CM

## 2019-10-08 DIAGNOSIS — E118 Type 2 diabetes mellitus with unspecified complications: Secondary | ICD-10-CM | POA: Diagnosis not present

## 2019-10-08 DIAGNOSIS — F333 Major depressive disorder, recurrent, severe with psychotic symptoms: Secondary | ICD-10-CM

## 2019-10-08 DIAGNOSIS — Z23 Encounter for immunization: Secondary | ICD-10-CM

## 2019-10-08 MED ORDER — ARIPIPRAZOLE 2 MG PO TABS
2.0000 mg | ORAL_TABLET | Freq: Every day | ORAL | 0 refills | Status: DC
Start: 2019-10-08 — End: 2019-10-10

## 2019-10-08 MED ORDER — SHINGRIX 50 MCG/0.5ML IM SUSR: 0.5000 mL | Freq: Once | INTRAMUSCULAR | 1 refills | Status: AC

## 2019-10-08 MED ORDER — FREESTYLE LIBRE 14 DAY SENSOR MISC: 1.0000 | Freq: Every day | 5 refills | Status: DC

## 2019-10-08 MED FILL — ARIPIPRAZOLE 2 MG TABS: 2 | 30 days supply | Qty: 30 | Fill #0

## 2019-10-08 NOTE — Progress Notes (Signed)
Subjective:  Patient ID: Tasha [name], female    DOB: 28-Oct-1960  Age: 59 y.o. MRN: [id number]  CC: Depression  This visit occurred during the SARS-CoV-2 public health emergency.  Safety protocols were in place, including screening questions prior to the visit, additional usage of staff PPE, and extensive cleaning of exam room while observing appropriate contact time as indicated for disinfecting solutions.    HPI Tasha [name] presents for f/up - 4 days ago was the 2-year anniversary of her father's death.  She briefly felt like she had had enough and talked about blowing her brains out.  She tells me that since then the suicidal ideations have resolved.  She continues to feel depressed and anxious and wants to add something to her current regimen.  Outpatient Medications Prior to Visit  Medication Sig Dispense Refill  . ALPRAZolam (XANAX) 1 MG tablet TAKE 1 TABLET BY MOUTH THREE TIMES DAILY AS NEEDED FOR ANXIETY 90 tablet 3  . aspirin EC 81 MG tablet Take 1 tablet (81 mg total) by mouth daily. 90 tablet 1  . Blood Glucose Monitoring Suppl (ACCU-CHEK GUIDE ME) w/Device KIT 1 Act by Does not apply route 3 (three) times daily as needed. 1 kit 0  . Continuous Blood Gluc Receiver (FREESTYLE LIBRE 14 DAY READER) DEVI 1 Act by Does not apply route daily. 6 each 5  . divalproex (DEPAKOTE) 500 MG DR tablet Take 1 tablet (500 mg total) by mouth 2 (two) times daily. 60 tablet 4  . ezetimibe (ZETIA) 10 MG tablet Take 1 tablet (10 mg total) by mouth daily. 90 tablet 1  . hydrOXYzine (ATARAX/VISTARIL) 25 MG tablet Take 1 tablet (25 mg total) by mouth every 8 (eight) hours as needed. 50 tablet 1  . omeprazole (PRILOSEC) 40 MG capsule TAKE 2 CAPSULES BY MOUTH ONCE A DAY 180 capsule 1  . pioglitazone (ACTOS) 15 MG tablet Take 1 tablet (15 mg total) by mouth daily. 90 tablet 1  . Semaglutide (RYBELSUS) 7 MG TABS Take 1 tablet by mouth daily. 90 tablet 0  . SYNTHROID 150 MCG tablet TAKE 1  TABLET BY MOUTH DAILY. 90 tablet 1  . vortioxetine HBr (TRINTELLIX) 10 MG TABS tablet Take 1 tablet (10 mg total) by mouth daily. 28 tablet 0  . chlorhexidine (PERIDEX) 0.12 % solution 15 mLs 2 (two) times daily.     . Continuous Blood Gluc Sensor (FREESTYLE LIBRE 14 DAY SENSOR) MISC 1 Act by Does not apply route daily. 6 each 5  . ibuprofen (ADVIL) 800 MG tablet Take 800 mg by mouth every 8 (eight) hours as needed.    Marland Kitchen NARCAN 4 MG/0.1ML LIQD nasal spray kit   2  . omega-3 acid ethyl esters (LOVAZA) 1 g capsule Take 2 capsules (2 g total) by mouth 2 (two) times daily. 360 capsule 1   No facility-administered medications prior to visit.    ROS Review of Systems  Constitutional: Negative.  Negative for appetite change, diaphoresis and fatigue.  HENT: Negative.   Eyes: Negative for visual disturbance.  Respiratory: Negative for cough, chest tightness, shortness of breath and wheezing.   Cardiovascular: Negative for chest pain, palpitations and leg swelling.  Gastrointestinal: Negative for abdominal pain and diarrhea.  Endocrine: Negative.   Genitourinary: Negative.  Negative for difficulty urinating.  Musculoskeletal: Negative.   Skin: Negative.   Neurological: Negative.  Negative for dizziness, weakness, light-headedness and headaches.  Hematological: Negative for adenopathy. Does not bruise/bleed easily.  Psychiatric/Behavioral: Positive for decreased  concentration and dysphoric mood. Negative for agitation, behavioral problems, confusion, self-injury, sleep disturbance and suicidal ideas. The patient is nervous/anxious.     Objective:  BP 124/76   Pulse 66   Temp 98.2 F (36.8 C) (Oral)   Ht 5' 7"  (1.702 m)   Wt 156 lb (70.8 kg)   SpO2 98%   BMI 24.43 kg/m   BP Readings from Last 3 Encounters:  10/08/19 124/76  08/28/19 (!) 110/60  08/12/19 138/74    Wt Readings from Last 3 Encounters:  10/08/19 156 lb (70.8 kg)  08/28/19 165 lb (74.8 kg)  08/12/19 165 lb 6 oz (75 kg)     Physical Exam Vitals reviewed.  Constitutional:      General: She is not in acute distress.    Appearance: She is not toxic-appearing or diaphoretic.  HENT:     Nose: Nose normal.     Mouth/Throat:     Mouth: Mucous membranes are moist.  Eyes:     General: No scleral icterus.    Conjunctiva/sclera: Conjunctivae normal.  Cardiovascular:     Rate and Rhythm: Normal rate and regular rhythm.     Heart sounds: No murmur heard.   Pulmonary:     Effort: Pulmonary effort is normal.     Breath sounds: No stridor. No wheezing, rhonchi or rales.  Abdominal:     Tenderness: There is no abdominal tenderness.     Hernia: No hernia is present.  Musculoskeletal:        General: Normal range of motion.     Cervical back: Neck supple.     Right lower leg: No edema.     Left lower leg: No edema.  Lymphadenopathy:     Cervical: No cervical adenopathy.  Skin:    General: Skin is warm and dry.     Coloration: Skin is not pale.  Neurological:     General: No focal deficit present.     Mental Status: She is alert.  Psychiatric:        Attention and Perception: Attention and perception normal.        Mood and Affect: Mood is anxious and depressed. Affect is flat and tearful.        Speech: Speech normal. She is communicative. Speech is not rapid and pressured, delayed, slurred or tangential.        Behavior: Behavior normal. Behavior is not agitated, slowed, aggressive, withdrawn, hyperactive or combative. Behavior is cooperative.        Thought Content: Thought content normal. Thought content is not paranoid or delusional. Thought content does not include homicidal or suicidal ideation. Thought content does not include homicidal or suicidal plan.        Cognition and Memory: Cognition and memory normal.        Judgment: Judgment normal.     Lab Results  Component Value Date   WBC 7.1 05/14/2019   HGB 13.8 05/14/2019   HCT 42.4 05/14/2019   PLT 151 05/14/2019   GLUCOSE 96 07/22/2019     CHOL 207 (H) 01/27/2019   TRIG 201.0 (H) 07/22/2019   HDL 36.90 (L) 01/27/2019   LDLDIRECT 131.0 01/27/2019   LDLCALC 149 (H) 04/10/2016   ALT 36 (H) 07/22/2019   AST 31 07/22/2019   NA 138 07/22/2019   K 3.9 07/22/2019   CL 104 07/22/2019   CREATININE 0.65 07/22/2019   BUN 11 07/22/2019   CO2 29 07/22/2019   TSH 0.04 (L) 07/22/2019   INR 1.0  01/27/2019   HGBA1C 8.8 (H) 07/22/2019   MICROALBUR <0.7 01/27/2019    DG Chest 2 View  Result Date: 05/14/2019 CLINICAL DATA:  Chest pain. EXAM: CHEST - 2 VIEW COMPARISON:  January 28, 2019. FINDINGS: The heart size and mediastinal contours are within normal limits. Both lungs are clear. No pneumothorax or pleural effusion is noted. The visualized skeletal structures are unremarkable. IMPRESSION: No active cardiopulmonary disease. Electronically Signed   By: Marijo Conception M.D.   On: 05/14/2019 15:31    Assessment & Plan:   Irais was seen today for depression.  Diagnoses and all orders for this visit:  Severe episode of recurrent major depressive disorder, with psychotic features ([place])- She is no longer suicidal and has a very good support system at home.  I have recommended that she add an atypical antipsychotic to her current regimen of psychotropic agents. -     ARIPiprazole (ABILIFY) 2 MG tablet; Take 1 tablet (2 mg total) by mouth daily.  Need for shingles vaccine -     Zoster Vaccine Adjuvanted [facility]) injection; Inject 0.5 mLs into the muscle once for 1 dose.  Type II diabetes mellitus with manifestations (HCC) -     Continuous Blood Gluc Sensor (FREESTYLE LIBRE 14 DAY SENSOR) MISC; 1 Act by Does not apply route daily.  Hyperglyceridemia, pure -     icosapent Ethyl (VASCEPA) 1 g capsule; Take 2 capsules (2 g total) by mouth 2 (two) times daily.  Other orders -     Flu Vaccine QUAD 6+ mos PF IM (Fluarix Quad PF)   I have discontinued Cephus Shelling. Tasha [name] Narcan, ibuprofen, and chlorhexidine. I have also  changed her omega-3 acid ethyl esters to icosapent Ethyl. Additionally, I am having her start on ARIPiprazole and Shingrix. Lastly, I am having her maintain her aspirin EC, divalproex, omeprazole, ezetimibe, pioglitazone, FreeStyle Libre 14 Day Reader, Accu-Chek Guide Me, hydrOXYzine, Rybelsus, ALPRAZolam, Synthroid, vortioxetine HBr, and FreeStyle Libre 14 Day Sensor.  Meds ordered this encounter  Medications  . ARIPiprazole (ABILIFY) 2 MG tablet    Sig: Take 1 tablet (2 mg total) by mouth daily.    Dispense:  30 tablet    Refill:  0  . Zoster Vaccine Adjuvanted [facility]) injection    Sig: Inject 0.5 mLs into the muscle once for 1 dose.    Dispense:  0.5 mL    Refill:  1  . Continuous Blood Gluc Sensor (FREESTYLE LIBRE 14 DAY SENSOR) MISC    Sig: 1 Act by Does not apply route daily.    Dispense:  6 each    Refill:  5  . icosapent Ethyl (VASCEPA) 1 g capsule    Sig: Take 2 capsules (2 g total) by mouth 2 (two) times daily.    Dispense:  360 capsule    Refill:  1     Follow-up: Return in about 4 weeks (around 11/05/2019).  [name], MD

## 2019-10-08 NOTE — Progress Notes (Signed)
  Chronic Care Management   Note  10/08/2019 Name: Jaxyn Mestas MRN: [id number] DOB: Jan 02, 1961  Ardelia Mems Link is a 60 y.o. year old female who is a primary care patient of Janith Lima, MD. I reached out to [name] by phone today in response to a referral sent by Ms. Ardelia Mems Steadman's PCP, Janith Lima, MD.   Ms. Fleeger was given information about Chronic Care Management services today including:  1. CCM service includes personalized support from designated clinical staff supervised by her physician, including individualized plan of care and coordination with other care providers 2. 24/7 contact phone numbers for assistance for urgent and routine care needs. 3. Service will only be billed when office clinical staff spend 20 minutes or more in a month to coordinate care. 4. Only one practitioner may furnish and bill the service in a calendar month. 5. The patient may stop CCM services at any time (effective at the end of the month) by phone call to the office staff.   Patient agreed to services and verbal consent obtained.   Charlene Brooke, PharmD, BCACP Clinical Pharmacist [facility] [phone]

## 2019-10-08 NOTE — Patient Instructions (Signed)
Major Depressive Disorder, Adult Major depressive disorder (MDD) is a mental health condition. It may also be called clinical depression or unipolar depression. MDD usually causes feelings of sadness, hopelessness, or helplessness. MDD can also cause physical symptoms. It can interfere with work, school, relationships, and other everyday activities. MDD may be mild, moderate, or severe. It may occur once (single episode major depressive disorder) or it may occur multiple times (recurrent major depressive disorder). What are the causes? The exact cause of this condition is not known. MDD is most likely caused by a combination of things, which may include:  Genetic factors. These are traits that are passed along from parent to child.  Individual factors. Your personality, your behavior, and the way you handle your thoughts and feelings may contribute to MDD. This includes personality traits and behaviors learned from others.  Physical factors, such as: ? Differences in the part of your brain that controls emotion. This part of your brain may be different than it is in people who do not have MDD. ? Long-term (chronic) medical or psychiatric illnesses.  Social factors. Traumatic experiences or major life changes may play a role in the development of MDD. What increases the risk? This condition is more likely to develop in women. The following factors may also make you more likely to develop MDD:  A family history of depression.  Troubled family relationships.  Abnormally low levels of certain brain chemicals.  Traumatic events in childhood, especially abuse or the loss of a parent.  Being under a lot of stress, or long-term stress, especially from upsetting life experiences or losses.  A history of: ? Chronic physical illness. ? Other mental health disorders. ? Substance abuse.  Poor living conditions.  Experiencing social exclusion or discrimination on a regular basis. What are the  signs or symptoms? The main symptoms of MDD typically include:  Constant depressed or irritable mood.  Loss of interest in things and activities. MDD symptoms may also include:  Sleeping or eating too much or too little.  Unexplained weight change.  Fatigue or low energy.  Feelings of worthlessness or guilt.  Difficulty thinking clearly or making decisions.  Thoughts of suicide or of harming others.  Physical agitation or weakness.  Isolation. Severe cases of MDD may also occur with other symptoms, such as:  Delusions or hallucinations, in which you imagine things that are not real (psychotic depression).  Low-level depression that lasts at least a year (chronic depression or persistent depressive disorder).  Extreme sadness and hopelessness (melancholic depression).  Trouble speaking and moving (catatonic depression). How is this diagnosed? This condition may be diagnosed based on:  Your symptoms.  Your medical history, including your mental health history. This may involve tests to evaluate your mental health. You may be asked questions about your lifestyle, including any drug and alcohol use, and how long you have had symptoms of MDD.  A physical exam.  Blood tests to rule out other conditions. You must have a depressed mood and at least four other MDD symptoms most of the day, nearly every day in the same 2-week timeframe before your health care provider can confirm a diagnosis of MDD. How is this treated? This condition is usually treated by mental health professionals, such as psychologists, psychiatrists, and clinical social workers. You may need more than one type of treatment. Treatment may include:  Psychotherapy. This is also called talk therapy or counseling. Types of psychotherapy include: ? Cognitive behavioral therapy (CBT). This type of therapy   teaches you to recognize unhealthy feelings, thoughts, and behaviors, and replace them with positive thoughts  and actions. ? Interpersonal therapy (IPT). This helps you to improve the way you relate to and communicate with others. ? Family therapy. This treatment includes members of your family.  Medicine to treat anxiety and depression, or to help you control certain emotions and behaviors.  Lifestyle changes, such as: ? Limiting alcohol and drug use. ? Exercising regularly. ? Getting plenty of sleep. ? Making healthy eating choices. ? Spending more time outdoors.  Treatments involving stimulation of the brain can be used in situations with extremely severe symptoms, or when medicine or other therapies do not work over time. These treatments include electroconvulsive therapy, transcranial magnetic stimulation, and vagal nerve stimulation. Follow these instructions at home: Activity  Return to your normal activities as told by your health care provider.  Exercise regularly and spend time outdoors as told by your health care provider. General instructions  Take over-the-counter and prescription medicines only as told by your health care provider.  Do not drink alcohol. If you drink alcohol, limit your alcohol intake to no more than 1 drink a day for nonpregnant women and 2 drinks a day for men. One drink equals 12 oz of beer, 5 oz of wine, or 1 oz of hard liquor. Alcohol can affect any antidepressant medicines you are taking. Talk to your health care provider about your alcohol use.  Eat a healthy diet and get plenty of sleep.  Find activities that you enjoy doing, and make time to do them.  Consider joining a support group. Your health care provider may be able to recommend a support group.  Keep all follow-up visits as told by your health care provider. This is important. Where to find more information National Alliance on Mental Illness  www.nami.org [place] National Institute of Mental Health  www.nimh.nih.gov National Suicide Prevention Lifeline  1-800-273-TALK (8255). This is  free, 24-hour help. Contact a health care provider if:  Your symptoms get worse.  You develop new symptoms. Get help right away if:  You self-harm.  You have serious thoughts about hurting yourself or others.  You see, hear, taste, smell, or feel things that are not present (hallucinate). This information is not intended to replace advice given to you by your health care provider. Make sure you discuss any questions you have with your health care provider. Document Revised: 01/05/2017 Document Reviewed: 08/04/2015 Elsevier Patient Education  2020 Elsevier Inc.  

## 2019-10-08 NOTE — Progress Notes (Signed)
Met with patient today face-to-face for education regarding Freestyle Libre - 14 day system. Set up Freestyle LibreLink app so patient will be able to monitor via her smart phone and avoid copay for Reader device. She does not have a functional sensor yet.  Coordinated with [employer] and [facility] to determine insurance coverage. The [employer] representative reports Elenor Legato will be covered through the preferred DME supplier, which she says are [facility], [facility] or [facility]. Per [facility] the Freestyle Libre sensors are covered by her insurance but copay is $114 per month. It is possible the copay will be reduced at one of the preferred suppliers. Advised patient to bring her prescription to [facility], [facility] or [facility] and see if copay is cheaper. Patient voiced understanding.

## 2019-10-10 ENCOUNTER — Other Ambulatory Visit: Payer: Self-pay

## 2019-10-10 DIAGNOSIS — E785 Hyperlipidemia, unspecified: Secondary | ICD-10-CM

## 2019-10-10 DIAGNOSIS — L508 Other urticaria: Secondary | ICD-10-CM

## 2019-10-10 DIAGNOSIS — Z23 Encounter for immunization: Secondary | ICD-10-CM | POA: Diagnosis not present

## 2019-10-10 DIAGNOSIS — E781 Pure hyperglyceridemia: Secondary | ICD-10-CM

## 2019-10-10 DIAGNOSIS — K7581 Nonalcoholic steatohepatitis (NASH): Secondary | ICD-10-CM

## 2019-10-10 DIAGNOSIS — E118 Type 2 diabetes mellitus with unspecified complications: Secondary | ICD-10-CM

## 2019-10-10 DIAGNOSIS — E039 Hypothyroidism, unspecified: Secondary | ICD-10-CM

## 2019-10-10 DIAGNOSIS — K219 Gastro-esophageal reflux disease without esophagitis: Secondary | ICD-10-CM

## 2019-10-10 DIAGNOSIS — F333 Major depressive disorder, recurrent, severe with psychotic symptoms: Secondary | ICD-10-CM

## 2019-10-10 MED ORDER — DIVALPROEX SODIUM 500 MG PO DR TAB
500.0000 mg | DELAYED_RELEASE_TABLET | Freq: Two times a day (BID) | ORAL | 4 refills | Status: DC
Start: 2019-10-10 — End: 2020-02-27

## 2019-10-10 MED ORDER — ICOSAPENT ETHYL 1 G PO CAPS
2.0000 g | ORAL_CAPSULE | Freq: Two times a day (BID) | ORAL | 1 refills | Status: DC
Start: 2019-10-10 — End: 2019-10-10

## 2019-10-10 MED ORDER — ICOSAPENT ETHYL 1 G PO CAPS: 2.0000 g | ORAL_CAPSULE | Freq: Two times a day (BID) | ORAL | 1 refills | Status: DC

## 2019-10-10 MED ORDER — EZETIMIBE 10 MG PO TABS: 10.0000 mg | ORAL_TABLET | Freq: Every day | ORAL | 1 refills | Status: DC

## 2019-10-10 MED ORDER — HYDROXYZINE HCL 25 MG PO TABS: 25.0000 mg | ORAL_TABLET | Freq: Three times a day (TID) | ORAL | 1 refills | Status: DC | PRN

## 2019-10-10 MED ORDER — RYBELSUS 7 MG PO TABS: 1.0000 | ORAL_TABLET | Freq: Every day | ORAL | 1 refills | Status: DC

## 2019-10-10 MED ORDER — ARIPIPRAZOLE 2 MG PO TABS: 2.0000 mg | ORAL_TABLET | Freq: Every day | ORAL | 0 refills | Status: DC

## 2019-10-10 MED ORDER — PIOGLITAZONE HCL 15 MG PO TABS: 15.0000 mg | ORAL_TABLET | Freq: Every day | ORAL | 1 refills | Status: DC

## 2019-10-10 MED ORDER — OMEPRAZOLE 40 MG PO CPDR: DELAYED_RELEASE_CAPSULE | ORAL | 1 refills | Status: DC

## 2019-10-10 MED ORDER — SYNTHROID 150 MCG PO TABS: 150.0000 ug | ORAL_TABLET | Freq: Every day | ORAL | 1 refills | Status: DC

## 2019-10-10 NOTE — Telephone Encounter (Signed)
Pt called and stated that she spoke to insurance and found out a shingles vaccine would be free at the physicans office vs going to her pharmacy. It would cost her $95 there. She would like to get it here. Is it ok to set up a nurse visit?  So also expressed concern of the Ability and Trintellix that was rx'd to her at her office visit. She said both medications would cost her $100/mo in addition to what she is already paying for maintenance medications. She stated she has already had a conversation with her insurance and they stated it is no other similar medications that would be cost effective for her and suggested she sees what else her PCP has to offer. She would like to know if you have any other options?    She is using the [facility] mail order pharmacy. She would like for the pended medications sent there. She said she forgot to mention she is using mail order now during her recent [place]. I have updated her pharmacy but I did inform the pt that PCP does his own refills and he is not in office on Fridays. She has also been told that the office is closed on Monday due to the Labor Day holiday. She understands the earliest she may receive a call back in regards to her concerns would possibly be on Tuesday.   Please advise.

## 2019-10-14 MED FILL — RYBELSUS 7 MG TABS: 7 | 30 days supply | Qty: 30 | Fill #1

## 2019-10-15 ENCOUNTER — Ambulatory Visit: Payer: Medicare Other | Admitting: Internal Medicine

## 2019-10-15 ENCOUNTER — Telehealth: Payer: Self-pay | Admitting: Neurology

## 2019-10-15 NOTE — Telephone Encounter (Signed)
Pt is asking for Dr Jannifer Franklin to call her to justify why he told her she was HIV positive in August of 2019.  Pt states she is not HIV positive and would very much like a call from Dr Jannifer Franklin when he is back in the office.

## 2019-10-16 NOTE — Telephone Encounter (Signed)
I called the patient. I discussed the results of the blood work, she was told that there were two possibilities with the blood work that she may have an early HIV infection or a false positive. Waiting 2 months and rechecking the blood work was recommended, apparently it came back negative indicating that the recent blood work resulted in a false positive.  The patient was never told that she had a definite HIV infection.

## 2019-10-20 ENCOUNTER — Telehealth: Payer: Self-pay | Admitting: Internal Medicine

## 2019-10-20 NOTE — Telephone Encounter (Signed)
    Patient states vortioxetine HBr (TRINTELLIX) 10 MG TABS tablet states medication too costly. She has started back taking Zoloft.  Patient also states ARIPiprazole (ABILIFY) 2 MG tablet makes her feel like a "zombie", she has stopped medication after 1 week

## 2019-10-21 NOTE — Telephone Encounter (Addendum)
Pt has been informed and stated that she would follow up with psych.

## 2019-10-27 ENCOUNTER — Telehealth: Payer: Self-pay

## 2019-10-27 ENCOUNTER — Other Ambulatory Visit: Payer: Self-pay | Admitting: Internal Medicine

## 2019-10-27 DIAGNOSIS — E781 Pure hyperglyceridemia: Secondary | ICD-10-CM

## 2019-10-27 DIAGNOSIS — L508 Other urticaria: Secondary | ICD-10-CM

## 2019-10-27 DIAGNOSIS — E118 Type 2 diabetes mellitus with unspecified complications: Secondary | ICD-10-CM

## 2019-10-27 DIAGNOSIS — K219 Gastro-esophageal reflux disease without esophagitis: Secondary | ICD-10-CM

## 2019-10-27 DIAGNOSIS — K7581 Nonalcoholic steatohepatitis (NASH): Secondary | ICD-10-CM

## 2019-10-27 MED ORDER — PIOGLITAZONE HCL 15 MG PO TABS: 15.0000 mg | ORAL_TABLET | Freq: Every day | ORAL | 1 refills | Status: DC

## 2019-10-27 MED ORDER — HYDROXYZINE HCL 25 MG PO TABS: 25.0000 mg | ORAL_TABLET | Freq: Three times a day (TID) | ORAL | 1 refills | Status: DC | PRN

## 2019-10-27 MED ORDER — OMEPRAZOLE 40 MG PO CPDR: 40.0000 mg | DELAYED_RELEASE_CAPSULE | Freq: Every day | ORAL | 1 refills | Status: DC

## 2019-10-27 MED ORDER — ICOSAPENT ETHYL 1 G PO CAPS: 2.0000 g | ORAL_CAPSULE | Freq: Two times a day (BID) | ORAL | 1 refills | Status: DC

## 2019-10-27 MED FILL — PIOGLITAZONE HCL 15 MG TABS: 15 | 90 days supply | Qty: 90 | Fill #0

## 2019-10-27 MED FILL — HYDROXYZINE HCL 25 MG TABS: 25 | 16 days supply | Qty: 50 | Fill #0

## 2019-10-27 NOTE — Telephone Encounter (Signed)
Pt called with c/o of inability to get meds filled from [facility].  She is also requesting Zoloft & Wellbutrin to be refilled.  See previous note from 10/20/19.  Pt has taken these meds before & does not have anymore WellButrin. Per the 9/13 note, pt said she did not want to  Take Ambilify or Trintellix any longer b/c of the way it made her feel; she was advised to consult a psychiatrist, which she agreed to & admits that Dr Ronnald Ramp recommended that she see Myra Gianotti LCSW at her OV on 9/1.  Pt states she "forgot to make the appt when I went downstairs", but that she did not want to see Marcello Moores, that she wanted to make an appt with Dr Tye Savoy (who she has seen in the past). Patient is also requesting refills to [facility]: pioglitazone, omeprazole, hydroxyzine.  Since meds were already filled per Dr Ronnald Ramp for [facility] to fill, I refilled to requested pharmacy as written by Dr Ronnald Ramp. Pt states she does not take the Ambilify or trintellix (did not like the way it made her fill), divalproex, icosapent; states she already has refills of synthroid & ezetimibe already (refilled 7/23 & 8/31 respectively). Called [employer] & rep stated that only medication they had questions about was the Omega 3 that was denied b/cnot on the preferred formulary for the patient's insurance plan. Pt informed that the icosapent is the recommended alternative by her pharmacy & is advised to take it; pt verb understanding.  Dr Alain Marion: can you please take a look at this since Dr Ronnald Ramp is out of office this week?

## 2019-10-28 ENCOUNTER — Other Ambulatory Visit: Payer: Self-pay

## 2019-10-28 ENCOUNTER — Telehealth: Payer: Self-pay | Admitting: Internal Medicine

## 2019-10-28 DIAGNOSIS — F418 Other specified anxiety disorders: Secondary | ICD-10-CM

## 2019-10-28 NOTE — Telephone Encounter (Signed)
Patient needs referral to behavorial health  Patient requests the appointment be made for her and call her with a date and time

## 2019-10-28 NOTE — Telephone Encounter (Signed)
Referral was ordered - they will call her to make an appt - we can not do that.

## 2019-10-28 NOTE — Telephone Encounter (Signed)
Pt has been informed that referral was placed and to look for a phone call to schedule. She expressed understanding.

## 2019-10-28 NOTE — Telephone Encounter (Signed)
Please see 9/14 telephone encounter and advise. Can you enter referral?

## 2019-10-28 NOTE — Patient Outreach (Signed)
[facility]) Care Management  [place]  10/28/2019   Tasha [name] [facility] Jul 05, 1960 [id number]  Subjective: Telephone call to patient for disease management follow up. Patient reports that she is doing ok. She reports she did not get the freestyle libre system due to cost. She is check ing her sugars about once a week.  She reports they are running around 150. Discussed with patient blood sugar control and importance of management of diabetes. Patient states that she is not taking her trintellix and that she is taking Zoloft.  Patient states that she was to see psychiatry but forgot to make the appointment.  Urged patient to make the appointment with psychiatry to get her psychiatry medication regimen managed better. She verbalized understanding. Patient also connected with [facility] as well and has their contact information as well.    Objective:   Encounter Medications:  Outpatient Encounter Medications as of 10/28/2019  Medication Sig  . ALPRAZolam (XANAX) 1 MG tablet TAKE 1 TABLET BY MOUTH THREE TIMES DAILY AS NEEDED FOR ANXIETY  . ARIPiprazole (ABILIFY) 2 MG tablet Take 1 tablet (2 mg total) by mouth daily.  Marland Kitchen aspirin EC 81 MG tablet Take 1 tablet (81 mg total) by mouth daily.  . Blood Glucose Monitoring Suppl (ACCU-CHEK GUIDE ME) w/Device KIT 1 Act by Does not apply route 3 (three) times daily as needed.  . Continuous Blood Gluc Receiver (FREESTYLE LIBRE 14 DAY READER) DEVI 1 Act by Does not apply route daily.  . Continuous Blood Gluc Sensor (FREESTYLE LIBRE 14 DAY SENSOR) MISC 1 Act by Does not apply route daily.  . divalproex (DEPAKOTE) 500 MG DR tablet Take 1 tablet (500 mg total) by mouth 2 (two) times daily.  Marland Kitchen ezetimibe (ZETIA) 10 MG tablet Take 1 tablet (10 mg total) by mouth daily.  . hydrOXYzine (ATARAX/VISTARIL) 25 MG tablet Take 1 tablet (25 mg total) by mouth every 8 (eight) hours as needed.  Marland Kitchen icosapent Ethyl (VASCEPA) 1 g capsule  Take 2 capsules (2 g total) by mouth 2 (two) times daily.  Marland Kitchen omeprazole (PRILOSEC) 40 MG capsule Take 1 capsule (40 mg total) by mouth daily. TAKE 2 CAPSULES BY MOUTH ONCE A DAY  . pioglitazone (ACTOS) 15 MG tablet Take 1 tablet (15 mg total) by mouth daily.  . Semaglutide (RYBELSUS) 7 MG TABS Take 1 tablet by mouth daily.  Marland Kitchen SYNTHROID 150 MCG tablet Take 1 tablet (150 mcg total) by mouth daily.  Marland Kitchen vortioxetine HBr (TRINTELLIX) 10 MG TABS tablet Take 1 tablet (10 mg total) by mouth daily.  . [DISCONTINUED] omega-3 acid ethyl esters (LOVAZA) 1 g capsule Take 2 capsules (2 g total) by mouth 2 (two) times daily.   No facility-administered encounter medications on file as of 10/28/2019.    Functional Status:  In your present state of health, do you have any difficulty performing the following activities: 10/02/2019  Hearing? N  Vision? N  Difficulty concentrating or making decisions? Y  Comment forgetful at times  Walking or climbing stairs? N  Dressing or bathing? N  Doing errands, shopping? N  Preparing Food and eating ? N  Using the Toilet? N  In the past six months, have you accidently leaked urine? N  Do you have problems with loss of bowel control? N  Managing your Medications? N  Managing your Finances? N  Housekeeping or managing your Housekeeping? N  Some recent data might be hidden    Fall/Depression Screening: Fall Risk  10/02/2019 09/19/2019 08/05/2019  Falls in the past year? _0 Number falls in past yr: 0 0 0  Injury with Fall? _1 Risk for fall due to : History of fall(s) History of fall(s) History of fall(s)  Follow up Falls prevention discussed Falls evaluation completed;Falls prevention discussed Falls evaluation completed;Education provided   [facility] 2/9 Scores 10/02/2019 07/22/2019 04/11/2019 07/25/2018 01/17/2018 09/03/2017 03/19/2017  PHQ - 2 Score _2 0 0 0 5  PHQ- 9 Score _3 0 - 22    Assessment: Patient continues with diabetes management and benefits from  disease management support.    Plan:  [facility] CM Care Plan Problem One     Most Recent Value  Care Plan Problem One Knowledge Deficit Diabetes  Role Documenting the Problem One Care Management Telephonic Coordinator  Care Plan for Problem One Active  [facility] Long Term Goal  Over the next 90 days, patient will demonstrate and/or verbalize understanding of self-health management for long term care of Diabetes.  [facility] Long Term Goal Start Date 10/02/19  Interventions for Problem One Long Term Goal Patient only checking sugars about aonce a week.  Patient states that her sugars run in the 150 range. Discussed importance of diet control.      RN CM will contact next month and patient agreeable.   Jone Baseman, RN, MSN [place] Management Care Management Coordinator Direct Line [phone] Cell [phone] Toll Free: [phone]  Fax: [phone]

## 2019-10-28 NOTE — Addendum Note (Signed)
Addended by: Binnie Rail on: 10/28/2019 01:18 PM   Modules accepted: Orders

## 2019-10-29 NOTE — Telephone Encounter (Signed)
It is too complicated to address in the telephone note.  She needs to see Dr. Ronnald Ramp next week physical or virtually and discuss her meds.  Thanks

## 2019-10-30 NOTE — Telephone Encounter (Signed)
LVM for pt to CB to schedule appt for Dr Ronnald Ramp for next to discuss her medicines.

## 2019-10-31 NOTE — Telephone Encounter (Signed)
LVM instructing Tasha [name] to call back to schedule appt with Dr Ronnald Ramp for next week to discuss medication management.

## 2019-11-05 ENCOUNTER — Telehealth: Payer: Self-pay | Admitting: Internal Medicine

## 2019-11-05 NOTE — Telephone Encounter (Signed)
Called pt, LVM. Need clarification on what medications are incorrect.   Pt has already been informed that she would receive a call to set up psych appt during previous phone encounter on 10/28/19.

## 2019-11-05 NOTE — Telephone Encounter (Signed)
Patient called and was having some questions about her medication she said that she looked on her mychart and said that her medications were not correct and she said that she called two psychiatrists and one said she was not taking new patients and the other said that she could not take the patient at this time either.    Please call patient back: [phone].

## 2019-11-07 ENCOUNTER — Other Ambulatory Visit: Payer: Self-pay | Admitting: Internal Medicine

## 2019-11-07 DIAGNOSIS — F418 Other specified anxiety disorders: Secondary | ICD-10-CM

## 2019-11-07 MED FILL — SERTRALINE HCL 100 MG TABS: 100 | 90 days supply | Qty: 180 | Fill #0

## 2019-11-07 MED FILL — VASCEPA 1 GM CAPSULE: 1 | 30 days supply | Qty: 120 | Fill #0

## 2019-11-07 MED FILL — ALPRAZOLAM 1 MG TABS: 1 | 30 days supply | Qty: 90 | Fill #2

## 2019-11-10 MED FILL — buPROPion HCL ER (XL) 150 M: 150 | 90 days supply | Qty: 90 | Fill #0

## 2019-11-11 MED FILL — RYBELSUS 7 MG TABS: 7 | 30 days supply | Qty: 30 | Fill #2

## 2019-11-18 ENCOUNTER — Other Ambulatory Visit: Payer: Self-pay

## 2019-11-18 DIAGNOSIS — E1165 Type 2 diabetes mellitus with hyperglycemia: Secondary | ICD-10-CM | POA: Diagnosis not present

## 2019-11-18 DIAGNOSIS — E039 Hypothyroidism, unspecified: Secondary | ICD-10-CM | POA: Diagnosis not present

## 2019-11-18 NOTE — Patient Outreach (Signed)
[name]) Care Management  11/18/2019  [name] 25-Apr-1960 [id number]   Telephone call to patient for disease management follow up.   No answer.  HIPAA compliant voice message left.    Plan: If no return call, RN CM will attempt patient again in the month of December.    Jone Baseman, RN, MSN [place] Management Care Management Coordinator Direct Line [phone] Cell [phone] Toll Free: [phone]  Fax: [phone]

## 2019-11-21 ENCOUNTER — Other Ambulatory Visit (HOSPITAL_COMMUNITY): Payer: Self-pay | Admitting: Internal Medicine

## 2019-11-21 DIAGNOSIS — Z23 Encounter for immunization: Secondary | ICD-10-CM | POA: Diagnosis not present

## 2019-11-21 MED FILL — SYNTHROID 137 MCG TABLET: 137 | 90 days supply | Qty: 90 | Fill #0

## 2019-11-27 ENCOUNTER — Ambulatory Visit: Payer: Self-pay

## 2019-12-15 DIAGNOSIS — H524 Presbyopia: Secondary | ICD-10-CM | POA: Diagnosis not present

## 2019-12-15 DIAGNOSIS — H2513 Age-related nuclear cataract, bilateral: Secondary | ICD-10-CM | POA: Diagnosis not present

## 2019-12-15 DIAGNOSIS — H11153 Pinguecula, bilateral: Secondary | ICD-10-CM | POA: Diagnosis not present

## 2019-12-15 DIAGNOSIS — H52203 Unspecified astigmatism, bilateral: Secondary | ICD-10-CM | POA: Diagnosis not present

## 2019-12-15 DIAGNOSIS — H5212 Myopia, left eye: Secondary | ICD-10-CM | POA: Diagnosis not present

## 2019-12-15 DIAGNOSIS — H35033 Hypertensive retinopathy, bilateral: Secondary | ICD-10-CM | POA: Diagnosis not present

## 2019-12-15 DIAGNOSIS — H5201 Hypermetropia, right eye: Secondary | ICD-10-CM | POA: Diagnosis not present

## 2019-12-15 DIAGNOSIS — E119 Type 2 diabetes mellitus without complications: Secondary | ICD-10-CM | POA: Diagnosis not present

## 2019-12-16 MED FILL — OMEPRAZOLE 40 MG CPDR: 40 | 90 days supply | Qty: 180 | Fill #0

## 2019-12-16 MED FILL — ALPRAZOLAM 1 MG TABS: 1 | 30 days supply | Qty: 90 | Fill #3

## 2019-12-19 ENCOUNTER — Telehealth: Payer: Self-pay | Admitting: Internal Medicine

## 2019-12-19 NOTE — Telephone Encounter (Signed)
Called patient back, and she states she has had diarrhea for the last 3 days. It is liquid/brown and 10 to 15 episodes a day. She has also had a lot of belching for 1 day that is foul smelling. She had to sleep in a recliner last night because she was so nauseated(no emesis) that she could not lay flat. Please advise

## 2019-12-19 NOTE — Telephone Encounter (Signed)
Called patient and let her know Dr. Carlean Purl would like her to contact her PCP and see if he can see her today, and if not to go to Urgent Care for triage and testing for acute diarrhea. She states her PCP is only open 1/2 day on Fridays. Also says she has been able to keep down some fluids the last little bit. She agreed to go to Urgent Care if the diarrhea continues.

## 2019-12-19 NOTE — Telephone Encounter (Signed)
Unless we have an APP opening today I recommend she call PCP to see if they are available or go to urgent care for appropriate triage and testing to work up acute diarrhea

## 2019-12-19 NOTE — Telephone Encounter (Signed)
Patient called states she is experiencing diarrhea along with burps that are smelling like rotten eggs please advise

## 2019-12-20 ENCOUNTER — Encounter (HOSPITAL_COMMUNITY): Payer: Self-pay | Admitting: *Deleted

## 2019-12-20 ENCOUNTER — Other Ambulatory Visit: Payer: Self-pay

## 2019-12-20 ENCOUNTER — Ambulatory Visit (HOSPITAL_COMMUNITY)
Admission: EM | Admit: 2019-12-20 | Discharge: 2019-12-20 | Disposition: A | Discharge status: Home / Self Care | Payer: Medicare HMO | Attending: Family Medicine | Admitting: Family Medicine

## 2019-12-20 DIAGNOSIS — E860 Dehydration: Secondary | ICD-10-CM | POA: Insufficient documentation

## 2019-12-20 DIAGNOSIS — R109 Unspecified abdominal pain: Secondary | ICD-10-CM

## 2019-12-20 DIAGNOSIS — K2900 Acute gastritis without bleeding: Secondary | ICD-10-CM | POA: Insufficient documentation

## 2019-12-20 LAB — POCT URINALYSIS DIPSTICK, ED / UC
Glucose, UA: NEGATIVE mg/dL
Hgb urine dipstick: NEGATIVE
Ketones, ur: NEGATIVE mg/dL
Leukocytes,Ua: NEGATIVE
Nitrite: NEGATIVE
Protein, ur: NEGATIVE mg/dL
Specific Gravity, Urine: >=1.03 (ref 1.005–1.030)
Urobilinogen, UA: 0.2 mg/dL (ref 0.0–1.0)
pH: 5.5 (ref 5.0–8.0)

## 2019-12-20 LAB — COMPREHENSIVE METABOLIC PANEL
ALT: 19 U/L (ref 0–44)
AST: 22 U/L (ref 15–41)
Albumin: 4.1 g/dL (ref 3.5–5.0)
Alkaline Phosphatase: 88 U/L (ref 38–126)
Anion gap: 8 (ref 5–15)
BUN: 10 mg/dL (ref 6–20)
CO2: 24 mmol/L (ref 22–32)
Calcium: 9.4 mg/dL (ref 8.9–10.3)
Chloride: 107 mmol/L (ref 98–111)
Creatinine, Ser: 0.75 mg/dL (ref 0.44–1.00)
GFR, Estimated: >60 mL/min (ref >60)
Glucose, Bld: 99 mg/dL (ref 70–99)
Potassium: 3.8 mmol/L (ref 3.5–5.1)
Sodium: 139 mmol/L (ref 135–145)
Total Bilirubin: 0.6 mg/dL (ref 0.3–1.2)
Total Protein: 7.5 g/dL (ref 6.5–8.1)

## 2019-12-20 LAB — CBC WITH DIFFERENTIAL/PLATELET
Abs Immature Granulocytes: 0.03 10*3/uL (ref 0.00–0.07)
Basophils Absolute: 0 10*3/uL (ref 0.0–0.1)
Basophils Relative: 0 %
Eosinophils Absolute: 1.2 10*3/uL — ABNORMAL HIGH (ref 0.0–0.5)
Eosinophils Relative: 12 %
HCT: 44.8 % (ref 36.0–46.0)
Hemoglobin: 14.8 g/dL (ref 12.0–15.0)
Immature Granulocytes: 0 %
Lymphocytes Relative: 36 %
Lymphs Abs: 3.6 10*3/uL (ref 0.7–4.0)
MCH: 30.8 pg (ref 26.0–34.0)
MCHC: 33 g/dL (ref 30.0–36.0)
MCV: 93.1 fL (ref 80.0–100.0)
Monocytes Absolute: 0.4 10*3/uL (ref 0.1–1.0)
Monocytes Relative: 4 %
Neutro Abs: 4.9 10*3/uL (ref 1.7–7.7)
Neutrophils Relative %: 48 %
Platelets: 136 10*3/uL — ABNORMAL LOW (ref 150–400)
RBC: 4.81 MIL/uL (ref 3.87–5.11)
RDW: 12.4 % (ref 11.5–15.5)
WBC: 10.1 10*3/uL (ref 4.0–10.5)
nRBC: 0 % (ref 0.0–0.2)

## 2019-12-20 LAB — CBG MONITORING, ED: Glucose-Capillary: 103 mg/dL — ABNORMAL HIGH (ref 70–99)

## 2019-12-20 LAB — LIPASE, BLOOD: Lipase: 33 U/L (ref 11–51)

## 2019-12-20 LAB — AMYLASE: Amylase: 36 U/L (ref 28–100)

## 2019-12-20 MED ORDER — DIPHENOXYLATE-ATROPINE 2.5-0.025 MG PO TABS: 1.0000 | ORAL_TABLET | Freq: Three times a day (TID) | ORAL | 0 refills | Status: DC | PRN

## 2019-12-20 MED ORDER — ONDANSETRON HCL 4 MG/2ML IJ SOLN
INTRAMUSCULAR | Status: AC
  Filled 2019-12-20: qty 2

## 2019-12-20 MED ORDER — ALUM & MAG HYDROXIDE-SIMETH 200-200-20 MG/5ML PO SUSP
30.0000 mL | Freq: Once | ORAL | Status: AC
  Administered 2019-12-20: 30 mL via ORAL

## 2019-12-20 MED ORDER — ONDANSETRON HCL 4 MG/2ML IJ SOLN: 2.0000 mg | Freq: Once | INTRAMUSCULAR | Status: DC

## 2019-12-20 MED ORDER — SODIUM CHLORIDE 0.9 % IV SOLN: Freq: Once | INTRAVENOUS | Status: AC

## 2019-12-20 MED ORDER — LIDOCAINE VISCOUS HCL 2 % MT SOLN
OROMUCOSAL | Status: AC
  Filled 2019-12-20: qty 15

## 2019-12-20 MED ORDER — LIDOCAINE VISCOUS HCL 2 % MT SOLN
15.0000 mL | Freq: Once | OROMUCOSAL | Status: AC
  Administered 2019-12-20: 15 mL via ORAL

## 2019-12-20 MED ORDER — ONDANSETRON HCL 4 MG/2ML IJ SOLN
4.0000 mg | Freq: Once | INTRAMUSCULAR | Status: AC
  Administered 2019-12-20: 4 mg via INTRAVENOUS

## 2019-12-20 MED ORDER — ALUM & MAG HYDROXIDE-SIMETH 200-200-20 MG/5ML PO SUSP
ORAL | Status: AC
  Filled 2019-12-20: qty 30

## 2019-12-20 MED ORDER — ONDANSETRON HCL 4 MG PO TABS: 4.0000 mg | ORAL_TABLET | Freq: Four times a day (QID) | ORAL | 0 refills | Status: DC

## 2019-12-20 NOTE — Discharge Instructions (Addendum)
Your labs are still pending I will follow up with you once the results are known.  I have sent over a prescription for Zofran as needed for nausea. I have also sent over antidiarrheal medicine if the diarrhea resumes.  I will await your lab results to determine if antibiotic therapy is warranted.  For now would recommend resting your GI system by only eating bland and clear liquids for the next 48 hours.  I have attached a list of foods that are appropriate for GI rest.  Also recommend contacting Dr. Celesta Aver office and scheduling a follow-up appointment for further work-up of abdominal symptoms. Your platelet count is mildly low which is may be a result of your mild dehydration or current illness however these levels will need to be repeated within the next 7 to 14 days.

## 2019-12-20 NOTE — ED Triage Notes (Addendum)
C/O intermittent RUQ abd pain with watery diarrhea x 3 days.  C/O nausea; denies vomiting.  Unsure if fevers.  States when belching "the smell is awful".

## 2019-12-20 NOTE — ED Provider Notes (Signed)
[place]    CSN: [id number] Arrival date & time: 12/20/19  1034      History   Chief Complaint Chief Complaint  Patient presents with  . Abdominal Pain    HPI Tasha [name] is a 59 y.o. female.   HPI Patient presents today with a history of 2 days of watery diarrhea and right sided and mid abdominal pain.  Patient reports that she has been belching and the odor of the belch is foul smelling.  She has been nauseous although has not vomited.  She reports that she has had lower than normal urine output. She has tolerated a small sub sandwich last night and donut and coke this morning . She felt dizziness and weak upon awakening and thought blood sugar may be low and was able to tolerate coke and donut. Continues to have abdominal pain which ranges from aching to cramping.  She continues to experience nausea while here in clinic and has felt weak and in the sensation of feeling as though she is going to pass out since being here in the clinic.  However sensation o passing out resolved with sitting and rest. Past Medical History:  Diagnosis Date  . Allergy   . Anemia    past hx   . Anxiety state, unspecified   . Arthralgia of temporomandibular joint   . Arthritis   . Asthma    as a child , not now per pt   . Conversion disorder   . Dementia ([place])   . Depressive disorder, not elsewhere classified   . Diabetes mellitus without complication ([place])    off all medicines as of 04-16-2019 PV   . Dysphagia, unspecified(787.20)   . Endometriosis   . Esophageal reflux   . Fibromyalgia   . Headache(784.0)   . Heart murmur    past hx murmur   . Hx of adenomatous colonic polyps   . Irritable bowel syndrome   . Mitral valve disorders(424.0)   . Myalgia and myositis, unspecified   . Overweight(278.02)   . Periodic limb movement disorder   . Pure hypercholesterolemia   . RLS (restless legs syndrome)   . Seizures ([place])    last episode 5 yrs ago   . Stroke [facility])    TIA  per pt many years ago   . Unspecified hypothyroidism     Patient Active Problem List   Diagnosis Date Noted  . Severe episode of recurrent major depressive disorder, with psychotic features ([place]) 10/08/2019  . Urticaria, acute 08/12/2019  . Hyperglyceridemia, pure 08/07/2018  . Polyp of colon 07/25/2018  . Esophageal dysphagia 07/25/2018  . Lumbar pain 10/18/2017  . HIV antibody positive ([place]) 09/27/2017  . Mild intermittent asthma without complication 62/94/7654  . Primary insomnia 04/10/2016  . Extremity atherosclerosis with intermittent claudication ([place]) 04/10/2016  . Routine general medical examination at a health care facility 12/20/2014  . Visit for screening mammogram 12/16/2014  . Cervical cancer screening 12/16/2014  . Primary osteoarthritis of both knees 06/22/2014  . Migraine without aura and with status migrainosus, not intractable 02/03/2014  . Nonspecific abnormal electrocardiogram (ECG) (EKG) 12/01/2013  . Type II diabetes mellitus with manifestations ([place]) 09/11/2013  . Estrogen deficiency 05/20/2013  . Vitamin D deficiency 04/23/2013  . OBESITY, MILD 06/24/2008  . Hyperlipidemia with target LDL less than 130 09/08/2007  . Periodic limb movement disorder 09/08/2007  . CIGARETTE SMOKER 09/04/2007  . Hypothyroidism 11/22/2006  . Depression with anxiety 11/22/2006  . GERD 11/22/2006  .  IBS 11/22/2006  . Nonalcoholic steatohepatitis ([facility]) 11/22/2006    Past Surgical History:  Procedure Laterality Date  . ABDOMINAL HYSTERECTOMY     TAH  . arm surgery    . COLONOSCOPY  04/14/2008  . FOOT SURGERY    . LIPOMA EXCISION     BESIDES BELLY BUTTON  . POLYPECTOMY     HPP x 1  . TONSILLECTOMY    . UPPER GASTROINTESTINAL ENDOSCOPY  2010   [facility]   . WRIST SURGERY      OB History    Gravida  5   Para  2   Term      Preterm      AB  3   Living  2     SAB  2   TAB      Ectopic  1   Multiple      Live Births               Home  Medications    Prior to Admission medications   Medication Sig Start Date End Date Taking? Authorizing Provider  ALPRAZolam Duanne Moron) 1 MG tablet TAKE 1 TABLET BY MOUTH THREE TIMES DAILY AS NEEDED FOR ANXIETY 08/29/19   Janith Lima, MD  ARIPiprazole (ABILIFY) 2 MG tablet Take 1 tablet (2 mg total) by mouth daily. 10/10/19   Janith Lima, MD  aspirin EC 81 MG tablet Take 1 tablet (81 mg total) by mouth daily. 07/26/18   Janith Lima, MD  Blood Glucose Monitoring Suppl (ACCU-CHEK GUIDE ME) w/Device KIT 1 Act by Does not apply route 3 (three) times daily as needed. 08/12/19   Janith Lima, MD  Continuous Blood Gluc Receiver (FREESTYLE LIBRE 14 DAY READER) DEVI 1 Act by Does not apply route daily. 08/12/19   Janith Lima, MD  Continuous Blood Gluc Sensor (FREESTYLE LIBRE 14 DAY SENSOR) MISC 1 Act by Does not apply route daily. 10/08/19   Janith Lima, MD  divalproex (DEPAKOTE) 500 MG DR tablet Take 1 tablet (500 mg total) by mouth 2 (two) times daily. 10/10/19   Janith Lima, MD  ezetimibe (ZETIA) 10 MG tablet Take 1 tablet (10 mg total) by mouth daily. 10/10/19   Janith Lima, MD  hydrOXYzine (ATARAX/VISTARIL) 25 MG tablet Take 1 tablet (25 mg total) by mouth every 8 (eight) hours as needed. 10/27/19   Janith Lima, MD  icosapent Ethyl (VASCEPA) 1 g capsule Take 2 capsules (2 g total) by mouth 2 (two) times daily. 10/27/19   Janith Lima, MD  omeprazole (PRILOSEC) 40 MG capsule Take 1 capsule (40 mg total) by mouth daily. TAKE 2 CAPSULES BY MOUTH ONCE A DAY 10/27/19   Janith Lima, MD  pioglitazone (ACTOS) 15 MG tablet Take 1 tablet (15 mg total) by mouth daily. 10/27/19   Janith Lima, MD  Semaglutide (RYBELSUS) 7 MG TABS Take 1 tablet by mouth daily. 10/10/19   Janith Lima, MD  SYNTHROID 150 MCG tablet Take 1 tablet (150 mcg total) by mouth daily. 10/10/19   Janith Lima, MD  vortioxetine HBr (TRINTELLIX) 10 MG TABS tablet Take 1 tablet (10 mg total) by mouth daily. 09/19/19    Janith Lima, MD  omega-3 acid ethyl esters (LOVAZA) 1 g capsule Take 2 capsules (2 g total) by mouth 2 (two) times daily. 04/18/19 10/10/19  Janith Lima, MD    Family History Family History  Problem Relation Age of Onset  .  Heart disease Mother   . Heart disease Father   . Diabetes Father   . Colon cancer Father 52       died age 77 from colon cancer spreading per pt   . Breast cancer Maternal Aunt   . Colon cancer Paternal Aunt   . Stomach cancer Paternal Aunt   . Lung cancer Paternal Aunt   . Rectal cancer Paternal Aunt   . Stomach cancer Paternal Uncle   . Colon polyps Neg Hx   . Esophageal cancer Neg Hx     Social History Social History   Tobacco Use  . Smoking status: Current Some Day Smoker    Packs/day: 1.00    Types: Cigarettes  . Smokeless tobacco: Never Used  . Tobacco comment: 5 or 6 per day  Vaping Use  . Vaping Use: Some days  . Substances: Nicotine, Flavoring  Substance Use Topics  . Alcohol use: Yes    Comment: rarely  . Drug use: No     Allergies   Farxiga [dapagliflozin], Metformin and related, Statins, Augmentin [amoxicillin-pot clavulanate], Crestor [rosuvastatin], Lipitor [atorvastatin], Zocor [simvastatin], and Ozempic (0.25 or 0.5 mg-dose) [semaglutide(0.25 or 0.56m-dos)]  Review of Systems Review of Systems Pertinent negatives listed in HPI  Physical Exam Triage Vital Signs ED Triage Vitals  Enc Vitals Group     BP 12/20/19 1124 (!) 131/53     Pulse Rate 12/20/19 1124 72     Resp 12/20/19 1124 14     Temp 12/20/19 1124 97.8 F (36.6 C)     Temp Source 12/20/19 1124 Oral     SpO2 12/20/19 1124 98 %     Weight --      Height --      Head Circumference --      Peak Flow --      Pain Score 12/20/19 1123 8     Pain Loc --      Pain Edu? --      Excl. in [place] --    No data found.  Updated Vital Signs BP (!) 131/53   Pulse 72   Temp 97.8 F (36.6 C) (Oral)   Resp 14   SpO2 98%   Visual Acuity Right Eye Distance:    Left Eye Distance:   Bilateral Distance:    Right Eye Near:   Left Eye Near:    Bilateral Near:     Physical Exam Constitutional:      Appearance: She is ill-appearing.  HENT:     Head: Normocephalic and atraumatic.  Eyes:     Extraocular Movements: Extraocular movements intact.  Cardiovascular:     Rate and Rhythm: Normal rate and regular rhythm.  Pulmonary:     Effort: Pulmonary effort is normal.     Breath sounds: Normal breath sounds.  Abdominal:     General: Bowel sounds are increased. There is no distension or abdominal bruit.     Palpations: Abdomen is soft. There is no shifting dullness, mass or pulsatile mass.     Tenderness: There is abdominal tenderness in the right upper quadrant, right lower quadrant and epigastric area. There is no right CVA tenderness, left CVA tenderness, guarding or rebound. Negative signs include Murphy's sign, Rovsing's sign and obturator sign.     Hernia: No hernia is present.  Skin:    General: Skin is warm and dry.     Capillary Refill: Capillary refill takes less than 2 seconds.  Neurological:     General: No focal  deficit present.     Mental Status: She is alert.  Psychiatric:        Mood and Affect: Mood normal.      UC Treatments / Results  Labs (all labs ordered are listed, but only abnormal results are displayed) Labs Reviewed  CBG MONITORING, ED - Abnormal; Notable for the following components:      Result Value   Glucose-Capillary 103 (*)    All other components within normal limits    EKG Sinus rhythm 69 bpm, no ST changes, no evidence of ischemia normal rhythm strip   Radiology No results found.  Procedures Procedures (including critical care time)  Medications Ordered in UC Medications - No data to display  Initial Impression / Assessment and Plan / UC Course  I have reviewed the triage vital signs and the nursing notes.  Pertinent labs & imaging results that were available during my care of the patient  were reviewed by me and considered in my medical decision making (see chart for details).    Patient bolused with a liter of fluids and given a GI cocktail while here at urgent care.  Patient reported significant improvement of symptoms following treatment.  Prescribed Lomotil along with GI rest as patient has been eating heavy foods in spite of having GI symptoms which I feel has actually exacerbated current course of illness.  Advised to eat a liquid diet and avoid any harsh or highly acidic foods over the next 48 hours to allow GI rest.  Patient is to follow-up with primary care provider and/or Dr. Celesta Aver office for additional follow-up if symptoms do not completely resolve.  Strict ER precautions given.  Patient verbalized understanding and agreement with plan.  Final Clinical Impressions(s) / UC Diagnoses   Final diagnoses:  Acute gastritis without hemorrhage, unspecified gastritis type  Dehydration     Discharge Instructions     Your labs are still pending I will follow up with you once the results are known.  I have sent over a prescription for Zofran as needed for nausea. I have also sent over antidiarrheal medicine if the diarrhea resumes.  I will await your lab results to determine if antibiotic therapy is warranted.  For now would recommend resting your GI system by only eating bland and clear liquids for the next 48 hours.  I have attached a list of foods that are appropriate for GI rest.  Also recommend contacting Dr. Celesta Aver office and scheduling a follow-up appointment for further work-up of abdominal symptoms.     ED Prescriptions    Medication Sig Dispense Auth. Provider   ondansetron (ZOFRAN) 4 MG tablet Take 1 tablet (4 mg total) by mouth every 6 (six) hours. 20 tablet Scot Jun, FNP   diphenoxylate-atropine (LOMOTIL) 2.5-0.025 MG tablet Take 1 tablet by mouth 3 (three) times daily as needed for diarrhea or loose stools. 20 tablet Scot Jun, FNP      PDMP not reviewed this encounter.   Florrie, Ramires, FNP 12/20/19 [phone]

## 2019-12-22 ENCOUNTER — Telehealth: Payer: Self-pay | Admitting: Internal Medicine

## 2019-12-22 NOTE — Telephone Encounter (Addendum)
Patient reports continued right chest pain and belching.  Diarrhea has improved with prescribed tx from the Urgent Care.  Patient states that her symptoms did improve with liquid diet as suggested by the Urgent Care.  Her amylase and lipase were normal.  Patient believes her symptoms are from her gallbladder.  She is given first available app for 12/25/19 with Ellouise Newer, PA.  She is asked to remain on a liquid/bland diet.  Patient instructed to maintain an anti-reflux diet. Advised to avoid caffeine, mint, citrus foods/juices, tomatoes,  chocolate, NSAIDS/ASA products.  Instructed not to eat within 2 hours of exercise or bed, multiple small meals are better than 3 large meals.  Need to take PPI 30 minutes prior to 1st meal of the day.

## 2019-12-22 NOTE — Telephone Encounter (Signed)
Pt is requesting a call back from a nurse, pt went to Urgent Care and was told to get in touch with her GI provider to discuss her gallbladder, pt states she feels like she is going to have a heart attack/belching.

## 2019-12-23 ENCOUNTER — Ambulatory Visit (INDEPENDENT_AMBULATORY_CARE_PROVIDER_SITE_OTHER): Payer: Medicare HMO | Admitting: Internal Medicine

## 2019-12-23 ENCOUNTER — Other Ambulatory Visit: Payer: Self-pay | Admitting: Internal Medicine

## 2019-12-23 ENCOUNTER — Telehealth: Payer: Self-pay | Admitting: Internal Medicine

## 2019-12-23 ENCOUNTER — Encounter: Payer: Self-pay | Admitting: Internal Medicine

## 2019-12-23 ENCOUNTER — Other Ambulatory Visit: Payer: Self-pay

## 2019-12-23 ENCOUNTER — Ambulatory Visit (INDEPENDENT_AMBULATORY_CARE_PROVIDER_SITE_OTHER): Payer: Medicare HMO

## 2019-12-23 VITALS — BP 122/74 | HR 66 | Temp 98.7°F | Ht 67.0 in | Wt 153.0 lb

## 2019-12-23 DIAGNOSIS — E781 Pure hyperglyceridemia: Secondary | ICD-10-CM | POA: Diagnosis not present

## 2019-12-23 DIAGNOSIS — R10816 Epigastric abdominal tenderness: Secondary | ICD-10-CM

## 2019-12-23 DIAGNOSIS — D696 Thrombocytopenia, unspecified: Secondary | ICD-10-CM

## 2019-12-23 DIAGNOSIS — E118 Type 2 diabetes mellitus with unspecified complications: Secondary | ICD-10-CM

## 2019-12-23 DIAGNOSIS — E876 Hypokalemia: Secondary | ICD-10-CM | POA: Diagnosis not present

## 2019-12-23 DIAGNOSIS — E785 Hyperlipidemia, unspecified: Secondary | ICD-10-CM

## 2019-12-23 DIAGNOSIS — E039 Hypothyroidism, unspecified: Secondary | ICD-10-CM

## 2019-12-23 DIAGNOSIS — R197 Diarrhea, unspecified: Secondary | ICD-10-CM | POA: Diagnosis not present

## 2019-12-23 LAB — FOLATE: Folate: >23.6 ng/mL (ref >5.9)

## 2019-12-23 LAB — CBC WITH DIFFERENTIAL/PLATELET
Basophils Absolute: 0 10*3/uL (ref 0.0–0.1)
Basophils Relative: 0.4 % (ref 0.0–3.0)
Eosinophils Absolute: 1.5 10*3/uL — ABNORMAL HIGH (ref 0.0–0.7)
Eosinophils Relative: 15.1 % — ABNORMAL HIGH (ref 0.0–5.0)
HCT: 41.4 % (ref 36.0–46.0)
Hemoglobin: 14 g/dL (ref 12.0–15.0)
Lymphocytes Relative: 37 % (ref 12.0–46.0)
Lymphs Abs: 3.7 10*3/uL (ref 0.7–4.0)
MCHC: 33.9 g/dL (ref 30.0–36.0)
MCV: 92.3 fl (ref 78.0–100.0)
Monocytes Absolute: 0.5 10*3/uL (ref 0.1–1.0)
Monocytes Relative: 4.8 % (ref 3.0–12.0)
Neutro Abs: 4.2 10*3/uL (ref 1.4–7.7)
Neutrophils Relative %: 42.7 % — ABNORMAL LOW (ref 43.0–77.0)
Platelets: 136 10*3/uL — ABNORMAL LOW (ref 150.0–400.0)
RBC: 4.48 Mil/uL (ref 3.87–5.11)
RDW: 13.3 % (ref 11.5–15.5)
WBC: 9.9 10*3/uL (ref 4.0–10.5)

## 2019-12-23 LAB — POCT GLYCOSYLATED HEMOGLOBIN (HGB A1C): Hemoglobin A1C: 5.6 % (ref 4.0–5.6)

## 2019-12-23 LAB — BASIC METABOLIC PANEL
BUN: 10 mg/dL (ref 6–23)
CO2: 31 mEq/L (ref 19–32)
Calcium: 9.4 mg/dL (ref 8.4–10.5)
Chloride: 102 mEq/L (ref 96–112)
Creatinine, Ser: 0.79 mg/dL (ref 0.40–1.20)
GFR: 81.12 mL/min (ref >60.00)
Glucose, Bld: 101 mg/dL — ABNORMAL HIGH (ref 70–99)
Potassium: 3.4 mEq/L — ABNORMAL LOW (ref 3.5–5.1)
Sodium: 140 mEq/L (ref 135–145)

## 2019-12-23 LAB — LIPASE: Lipase: 32 U/L (ref 11.0–59.0)

## 2019-12-23 LAB — LIPID PANEL
Cholesterol: 165 mg/dL (ref 0–200)
HDL: 35.2 mg/dL — ABNORMAL LOW (ref >39.00)
LDL Cholesterol: 107 mg/dL — ABNORMAL HIGH (ref 0–99)
NonHDL: 130.03
Total CHOL/HDL Ratio: 5
Triglycerides: 113 mg/dL (ref 0.0–149.0)
VLDL: 22.6 mg/dL (ref 0.0–40.0)

## 2019-12-23 LAB — TSH: TSH: 0.14 u[IU]/mL — ABNORMAL LOW (ref 0.35–4.50)

## 2019-12-23 LAB — VITAMIN B12: Vitamin B-12: 303 pg/mL (ref 211–911)

## 2019-12-23 LAB — AMYLASE: Amylase: 22 U/L — ABNORMAL LOW (ref 27–131)

## 2019-12-23 MED ORDER — LEVOTHYROXINE SODIUM 112 MCG PO TABS: 112.0000 ug | ORAL_TABLET | Freq: Every day | ORAL | 0 refills | Status: DC

## 2019-12-23 MED FILL — LEVOTHYROXINE 112 MCG TAB: 112 | 90 days supply | Qty: 90 | Fill #0

## 2019-12-23 NOTE — Telephone Encounter (Signed)
At PCP request, appt made for today at 3:20.

## 2019-12-23 NOTE — Telephone Encounter (Signed)
Triaged by Access RN: chest pain started several days ago, went to UC, had normal EKG & was suggested that probable gallbladder issues.  B/c chest pain is persistent & lasting long than 7mins, RN recommends pt going to ED or calling 911.  Pt is refusing. After speaking to Dr Ronnald Ramp re: above assessment from Access RN, PCP would like pt to come in to be evaluated.

## 2019-12-23 NOTE — Telephone Encounter (Signed)
  Patient calling to report chest pain and back pain  Call transferred to [facility]

## 2019-12-23 NOTE — Progress Notes (Signed)
Subjective:  Patient ID: Tasha [name], female    DOB: Jun 22, 1960  Age: 59 y.o. MRN: [id number]  CC: Abdominal Pain  This visit occurred during the SARS-CoV-2 public health emergency.  Safety protocols were in place, including screening questions prior to the visit, additional usage of staff PPE, and extensive cleaning of exam room while observing appropriate contact time as indicated for disinfecting solutions.    HPI ASHTIN MELICHAR presents for f/up - She complains of a 10-day history of diarrhea, heartburn, right upper quadrant pain that radiates into her back (she describes the pain as a stabbing sensation).  The diarrhea is rapidly improving.  She has had a few episodes of dizziness and lightheadedness as well as weakness.  She tells me she was recently seen in urgent care center and was given some IV fluids.  She denies nausea, vomiting, fever, chills, dysuria, hematuria, bright red blood per rectum, or melena.  Outpatient Medications Prior to Visit  Medication Sig Dispense Refill  . ALPRAZolam (XANAX) 1 MG tablet TAKE 1 TABLET BY MOUTH THREE TIMES DAILY AS NEEDED FOR ANXIETY 90 tablet 3  . ARIPiprazole (ABILIFY) 2 MG tablet Take 1 tablet (2 mg total) by mouth daily. 30 tablet 0  . aspirin EC 81 MG tablet Take 1 tablet (81 mg total) by mouth daily. 90 tablet 1  . Blood Glucose Monitoring Suppl (ACCU-CHEK GUIDE ME) w/Device KIT 1 Act by Does not apply route 3 (three) times daily as needed. 1 kit 0  . Continuous Blood Gluc Receiver (FREESTYLE LIBRE 14 DAY READER) DEVI 1 Act by Does not apply route daily. 6 each 5  . Continuous Blood Gluc Sensor (FREESTYLE LIBRE 14 DAY SENSOR) MISC 1 Act by Does not apply route daily. 6 each 5  . divalproex (DEPAKOTE) 500 MG DR tablet Take 1 tablet (500 mg total) by mouth 2 (two) times daily. 60 tablet 4  . ezetimibe (ZETIA) 10 MG tablet Take 1 tablet (10 mg total) by mouth daily. 90 tablet 1  . icosapent Ethyl (VASCEPA) 1 g capsule Take 2 capsules  (2 g total) by mouth 2 (two) times daily. 360 capsule 1  . omeprazole (PRILOSEC) 40 MG capsule Take 1 capsule (40 mg total) by mouth daily. TAKE 2 CAPSULES BY MOUTH ONCE A DAY 180 capsule 1  . pioglitazone (ACTOS) 15 MG tablet Take 1 tablet (15 mg total) by mouth daily. 90 tablet 1  . diphenoxylate-atropine (LOMOTIL) 2.5-0.025 MG tablet Take 1 tablet by mouth 3 (three) times daily as needed for diarrhea or loose stools. 20 tablet 0  . hydrOXYzine (ATARAX/VISTARIL) 25 MG tablet Take 1 tablet (25 mg total) by mouth every 8 (eight) hours as needed. 50 tablet 1  . ondansetron (ZOFRAN) 4 MG tablet Take 1 tablet (4 mg total) by mouth every 6 (six) hours. 20 tablet 0  . Semaglutide (RYBELSUS) 7 MG TABS Take 1 tablet by mouth daily. 90 tablet 1  . SYNTHROID 150 MCG tablet Take 1 tablet (150 mcg total) by mouth daily. 90 tablet 1  . vortioxetine HBr (TRINTELLIX) 10 MG TABS tablet Take 1 tablet (10 mg total) by mouth daily. 28 tablet 0   No facility-administered medications prior to visit.    ROS Review of Systems  Constitutional: Negative for appetite change, diaphoresis, fatigue and unexpected weight change.  HENT: Negative.  Negative for trouble swallowing.   Eyes: Negative for visual disturbance.  Respiratory: Negative for cough, chest tightness, shortness of breath and wheezing.   Cardiovascular:  Negative for chest pain, palpitations and leg swelling.  Gastrointestinal: Positive for abdominal pain and diarrhea. Negative for blood in stool, constipation, nausea and vomiting.  Endocrine: Negative for cold intolerance and heat intolerance.  Genitourinary: Negative.  Negative for difficulty urinating and hematuria.  Musculoskeletal: Negative for arthralgias, myalgias and neck pain.  Skin: Negative.   Neurological: Positive for dizziness, weakness and light-headedness. Negative for numbness and headaches.  Hematological: Negative for adenopathy. Does not bruise/bleed easily.  Psychiatric/Behavioral:  Negative.     Objective:  BP 122/74   Pulse 66   Temp 98.7 F (37.1 C) (Oral)   Ht 5' 7"  (1.702 m)   Wt 153 lb (69.4 kg)   SpO2 96%   BMI 23.96 kg/m   BP Readings from Last 3 Encounters:  12/23/19 122/74  12/20/19 136/61  10/08/19 124/76    Wt Readings from Last 3 Encounters:  12/23/19 153 lb (69.4 kg)  10/08/19 156 lb (70.8 kg)  08/28/19 165 lb (74.8 kg)    Physical Exam Vitals reviewed.  Constitutional:      General: She is not in acute distress.    Appearance: She is well-developed. She is not ill-appearing, toxic-appearing or diaphoretic.  HENT:     Nose: Nose normal.     Mouth/Throat:     Mouth: Mucous membranes are moist.  Eyes:     General: No scleral icterus.    Conjunctiva/sclera: Conjunctivae normal.  Cardiovascular:     Rate and Rhythm: Normal rate and regular rhythm.  Pulmonary:     Effort: Pulmonary effort is normal.     Breath sounds: No wheezing, rhonchi or rales.  Abdominal:     General: Abdomen is flat. Bowel sounds are normal. There is no distension.     Palpations: Abdomen is soft. There is no hepatomegaly, splenomegaly or mass.     Tenderness: There is abdominal tenderness in the right upper quadrant. There is no guarding or rebound.  Musculoskeletal:        General: Normal range of motion.     Cervical back: Neck supple.     Right lower leg: No edema.     Left lower leg: No edema.  Lymphadenopathy:     Cervical: No cervical adenopathy.  Skin:    General: Skin is warm and dry.  Neurological:     General: No focal deficit present.     Mental Status: She is alert. Mental status is at baseline.  Psychiatric:        Mood and Affect: Mood normal.        Behavior: Behavior normal.     Lab Results  Component Value Date   WBC 9.9 12/23/2019   HGB 14.0 12/23/2019   HCT 41.4 12/23/2019   PLT 136.0 (L) 12/23/2019   GLUCOSE 101 (H) 12/23/2019   CHOL 165 12/23/2019   TRIG 113.0 12/23/2019   HDL 35.20 (L) 12/23/2019   LDLDIRECT 131.0  01/27/2019   LDLCALC 107 (H) 12/23/2019   ALT 19 12/20/2019   AST 22 12/20/2019   NA 140 12/23/2019   K 3.4 (L) 12/23/2019   CL 102 12/23/2019   CREATININE 0.79 12/23/2019   BUN 10 12/23/2019   CO2 31 12/23/2019   TSH 0.14 (L) 12/23/2019   INR 1.0 01/27/2019   HGBA1C 5.6 12/23/2019   MICROALBUR <0.7 01/27/2019   DG ABD ACUTE 2+V W 1V CHEST  Result Date: 12/23/2019 CLINICAL DATA:  Right upper quadrant and epigastric pain and diarrhea for 7 days. EXAM: DG ABDOMEN  ACUTE WITH 1 VIEW CHEST COMPARISON:  Chest x-ray 01/17/2018 FINDINGS: The upright chest x-ray is unremarkable. No acute pulmonary findings. No worrisome pulmonary lesions. No pleural effusions. The cardiac silhouette, mediastinal and hilar contours are normal. Two views of the abdomen demonstrate moderate stool throughout the colon and down into the rectum suggesting constipation. No distended small bowel loops to suggest obstruction. No free air. The soft tissue shadows are maintained. No worrisome calcifications. The bony structures are unremarkable. IMPRESSION: 1. No acute cardiopulmonary findings. 2. Moderate stool throughout the colon suggesting constipation. Electronically Signed   By: Marijo Sanes M.D.   On: 12/23/2019 16:16    Assessment & Plan:   Brooksie was seen today for abdominal pain.  Diagnoses and all orders for this visit:  Type II diabetes mellitus with manifestations ([place])- Her A1c is down to 5.6%.  I recommended that she stop taking the GLP-1 agonist. -     POCT glycosylated hemoglobin (Hb A1C) -     Basic metabolic panel; Future -     Basic metabolic panel  Acquired hypothyroidism- Her TSH is suppressed.  I recommended that she lower her dose of levothyroxine. -     Cancel: TSH; Future -     TSH; Future -     TSH -     levothyroxine (SYNTHROID) 112 MCG tablet; Take 1 tablet (112 mcg total) by mouth daily.  Hyperglyceridemia, pure- Her triglycerides are normal now.  This is not contributing to the  abdominal pain. -     Lipid panel; Future -     Lipid panel  Hyperlipidemia with target LDL less than 130- She is not willing to take a statin. -     Lipid panel; Future -     Lipid panel  Epigastric abdominal tenderness without rebound tenderness- Based on her symptoms, exam, labs, and plain films I do not think she has an urgent abdominal process.  I am concerned about gallbladder disease so I have asked her to undergo an abdominal ultrasound. -     CBC with Differential/Platelet; Future -     Lipase; Future -     Amylase; Future -     DG ABD ACUTE 2+V W 1V CHEST; Future -     Amylase -     Lipase -     CBC with Differential/Platelet -     US Abdomen Limited RUQ (LIVER/GB); Future  Thrombocytopenia ([place])- Her platelet count is mildly low but stable.  Testing for B12 and folate deficiency is negative.  This is likely ITP.  Will continue to follow this. -     CBC with Differential/Platelet; Future -     Vitamin B12; Future -     Folate; Future -     Folate -     Vitamin B12 -     CBC with Differential/Platelet   I have discontinued [facility] "Kim"'s vortioxetine HBr, Rybelsus, Synthroid, hydrOXYzine, ondansetron, and diphenoxylate-atropine. I am also having her start on levothyroxine. Additionally, I am having her maintain her aspirin EC, FreeStyle Libre 14 Day Reader, Accu-Chek Guide Me, ALPRAZolam, FreeStyle Libre 14 Day Sensor, ARIPiprazole, ezetimibe, divalproex, omeprazole, pioglitazone, and icosapent Ethyl.  Meds ordered this encounter  Medications  . levothyroxine (SYNTHROID) 112 MCG tablet    Sig: Take 1 tablet (112 mcg total) by mouth daily.    Dispense:  90 tablet    Refill:  0   I spent 50 minutes in preparing to see the patient by review of  recent labs, imaging and procedures, obtaining and reviewing separately obtained history, communicating with the patient and family or caregiver, ordering medications, tests or procedures, and documenting clinical  information in the EHR including the differential Dx, treatment, and any further evaluation and other management of 1. Type II diabetes mellitus with manifestations ([place]) 2. Acquired hypothyroidism 3. Hyperglyceridemia, pure 4. Hyperlipidemia with target LDL less than 130 5. Epigastric abdominal tenderness without rebound tenderness 6. Thrombocytopenia ([place]) 7. Acute hypokalemia     Follow-up: Return in about 3 weeks (around 01/13/2020).  [name], MD

## 2019-12-23 NOTE — Patient Instructions (Signed)
Abdominal Pain, Adult Pain in the abdomen (abdominal pain) can be caused by many things. Often, abdominal pain is not serious and it gets better with no treatment or by being treated at home. However, sometimes abdominal pain is serious. Your health care provider will ask questions about your medical history and do a physical exam to try to determine the cause of your abdominal pain. Follow these instructions at home:  Medicines  Take over-the-counter and prescription medicines only as told by your health care provider.  Do not take a laxative unless told by your health care provider. General instructions  Watch your condition for any changes.  Drink enough fluid to keep your urine pale yellow.  Keep all follow-up visits as told by your health care provider. This is important. Contact a health care provider if:  Your abdominal pain changes or gets worse.  You are not hungry or you lose weight without trying.  You are constipated or have diarrhea for more than 2-3 days.  You have pain when you urinate or have a bowel movement.  Your abdominal pain wakes you up at night.  Your pain gets worse with meals, after eating, or with certain foods.  You are vomiting and cannot keep anything down.  You have a fever.  You have blood in your urine. Get help right away if:  Your pain does not go away as soon as your health care provider told you to expect.  You cannot stop vomiting.  Your pain is only in areas of the abdomen, such as the right side or the left lower portion of the abdomen. Pain on the right side could be caused by appendicitis.  You have bloody or black stools, or stools that look like tar.  You have severe pain, cramping, or bloating in your abdomen.  You have signs of dehydration, such as: ? Dark urine, very little urine, or no urine. ? Cracked lips. ? Dry mouth. ? Sunken eyes. ? Sleepiness. ? Weakness.  You have trouble breathing or chest  pain. Summary  Often, abdominal pain is not serious and it gets better with no treatment or by being treated at home. However, sometimes abdominal pain is serious.  Watch your condition for any changes.  Take over-the-counter and prescription medicines only as told by your health care provider.  Contact a health care provider if your abdominal pain changes or gets worse.  Get help right away if you have severe pain, cramping, or bloating in your abdomen. This information is not intended to replace advice given to you by your health care provider. Make sure you discuss any questions you have with your health care provider. Document Revised: 06/03/2018 Document Reviewed: 06/03/2018 Elsevier Patient Education  2020 Elsevier Inc.  

## 2019-12-25 ENCOUNTER — Telehealth: Payer: Self-pay | Admitting: Internal Medicine

## 2019-12-25 ENCOUNTER — Encounter: Payer: Self-pay | Admitting: Internal Medicine

## 2019-12-25 ENCOUNTER — Ambulatory Visit: Payer: Medicare HMO | Admitting: Physician Assistant

## 2019-12-25 DIAGNOSIS — E876 Hypokalemia: Secondary | ICD-10-CM | POA: Insufficient documentation

## 2019-12-25 MED ORDER — POTASSIUM CHLORIDE CRYS ER 20 MEQ PO TBCR: 20.0000 meq | EXTENDED_RELEASE_TABLET | Freq: Two times a day (BID) | ORAL | 0 refills | Status: DC

## 2019-12-25 NOTE — Telephone Encounter (Addendum)
    Patient states she does not feel any better since last visit 11/16 Patient declined ED Chest and back pain  Requesting call from nurse/ Call transferred to [facility] at [facility]

## 2019-12-25 NOTE — Telephone Encounter (Signed)
Please advise 

## 2019-12-25 NOTE — Telephone Encounter (Signed)
See Telehealth note from today.

## 2019-12-25 NOTE — Telephone Encounter (Signed)
Juliann Pulse with [facility] called and said that the patient was having chest pains and she demands to speak with Dr. Ronnald Ramp in regards to this. She said that the patient told her that it was not her heart but it was her gallbladder. Juliann Pulse recommenced that she got to the ED and she refused. Please call the patient back at [phone].

## 2019-12-25 NOTE — Telephone Encounter (Signed)
Chest pain x 10days. Has been to [place] with Dr Ronnald Ramp 11/16. Ruled out heart issues with EKG at [facility].  Pt states she has abdm u/s scheduled for tomorrow morning. Pt also c/o lower back pain.  Declines something stronger for pain. Since pt symptoms not different from OV on 11/16, pt advised to wait for the u/s report to see if it indicates the reason for her abdm pain.  Pt verb understanding.

## 2019-12-26 ENCOUNTER — Other Ambulatory Visit: Payer: Medicare HMO

## 2019-12-26 ENCOUNTER — Ambulatory Visit
Admission: RE | Admit: 2019-12-26 | Discharge: 2019-12-26 | Disposition: A | Discharge status: Home / Self Care | Payer: Medicare HMO | Source: Ambulatory Visit | Attending: Internal Medicine | Admitting: Internal Medicine

## 2019-12-26 ENCOUNTER — Ambulatory Visit (INDEPENDENT_AMBULATORY_CARE_PROVIDER_SITE_OTHER)
Admission: RE | Admit: 2019-12-26 | Discharge: 2019-12-26 | Disposition: A | Discharge status: Home / Self Care | Payer: Medicare HMO | Source: Ambulatory Visit | Attending: Internal Medicine | Admitting: Internal Medicine

## 2019-12-26 ENCOUNTER — Other Ambulatory Visit: Payer: Self-pay

## 2019-12-26 ENCOUNTER — Other Ambulatory Visit: Payer: Self-pay | Admitting: Internal Medicine

## 2019-12-26 ENCOUNTER — Telehealth: Payer: Self-pay

## 2019-12-26 DIAGNOSIS — R10816 Epigastric abdominal tenderness: Secondary | ICD-10-CM

## 2019-12-26 DIAGNOSIS — R109 Unspecified abdominal pain: Secondary | ICD-10-CM | POA: Diagnosis not present

## 2019-12-26 DIAGNOSIS — K760 Fatty (change of) liver, not elsewhere classified: Secondary | ICD-10-CM | POA: Diagnosis not present

## 2019-12-26 DIAGNOSIS — K838 Other specified diseases of biliary tract: Secondary | ICD-10-CM | POA: Diagnosis not present

## 2019-12-26 MED ORDER — IOHEXOL 300 MG/ML  SOLN
100.0000 mL | Freq: Once | INTRAMUSCULAR | Status: AC | PRN
  Administered 2019-12-26: 100 mL via INTRAVENOUS

## 2019-12-26 NOTE — Telephone Encounter (Signed)
[facility] called to inform us that [place] Report is in [place]. Stated the pt is in a lot of pain. Please advise. I will contact pt in regard after your review.

## 2020-01-07 ENCOUNTER — Telehealth: Payer: Self-pay | Admitting: Internal Medicine

## 2020-01-07 NOTE — Telephone Encounter (Signed)
Patients daughter calling stating the patient is getting symptomatic again with her gallbladder and she is trying to see if the patient can get a MRCP

## 2020-01-09 ENCOUNTER — Other Ambulatory Visit: Payer: Self-pay | Admitting: Internal Medicine

## 2020-01-09 ENCOUNTER — Other Ambulatory Visit: Payer: Self-pay | Admitting: Surgery

## 2020-01-09 DIAGNOSIS — K838 Other specified diseases of biliary tract: Secondary | ICD-10-CM | POA: Diagnosis not present

## 2020-01-09 DIAGNOSIS — R1011 Right upper quadrant pain: Secondary | ICD-10-CM | POA: Diagnosis not present

## 2020-01-09 MED FILL — OXYCODONE-APAP 5-325MG: 5-325 | 7 days supply | Qty: 25 | Fill #0

## 2020-01-09 NOTE — Telephone Encounter (Addendum)
    Patient has requested the order for MRCP be canceled. She wants Dr Trevor Mace office to order.  She states there is some confusion from the surgical center regarding prior auth Please call

## 2020-01-09 NOTE — Telephone Encounter (Signed)
Pt has been informed and expressed understanding. Will look for the call to set up the appointment.

## 2020-01-13 ENCOUNTER — Other Ambulatory Visit (HOSPITAL_COMMUNITY): Payer: Self-pay | Admitting: Surgery

## 2020-01-13 DIAGNOSIS — K838 Other specified diseases of biliary tract: Secondary | ICD-10-CM

## 2020-01-14 ENCOUNTER — Other Ambulatory Visit: Payer: Self-pay

## 2020-01-14 NOTE — Patient Instructions (Signed)
Goals Addressed            This Visit's Progress   . THN-Make and Keep All Appointments       Timeframe:  Long-Range Goal Priority:  High Start Date:  01/14/20                           Expected End Date:  05/06/20                      - arrange a ride through an agency 1 week before appointment - keep a calendar with appointment dates    Why is this important?    Part of staying healthy is seeing the doctor for follow-up care.   If you forget your appointments, there are some things you can do to stay on track.    Notes:     . THN-Monitor and Manage My Blood Sugar-Diabetes Type 2       Timeframe:  Long-Range Goal Priority:  High Start Date:  01/14/20                           Expected End Date:  05/06/20                      - check blood sugar at prescribed times - take the blood sugar log to all doctor visits    Why is this important?    Checking your blood sugar at home helps to keep it from getting very high or very low.   Writing the results in a diary or log helps the doctor know how to care for you.   Your blood sugar log should have the time, date and the results.   Also, write down the amount of insulin or other medicine that you take.   Other information, like what you ate, exercise done and how you were feeling, will also be helpful.     Notes:

## 2020-01-14 NOTE — Patient Outreach (Signed)
[facility]) Care Management  [place]  01/14/2020   Tasha [name] 1960/10/22 [id number]  Subjective: Telephone call to patient for disease management.  Patient reports she is doing ok but having some back pain. She manages pain by resting but does have pain medication she can take but she does not like to take it.  Patient is schedule for a MRCP on tomorrow.  Advised patient this may answer some questions when it comes to her right sided back pain/spasms. Patient last A1c was 5.6. She states she is not monitoring her sugars right now as she was taken off the Rybelsus, which was causing her sugar increases. She states she has cut back her carbohydrates to help.  Advised to continue.  Patient voices no concerns.   Objective:   Encounter Medications:  Outpatient Encounter Medications as of 01/14/2020  Medication Sig  . ALPRAZolam (XANAX) 1 MG tablet TAKE 1 TABLET BY MOUTH THREE TIMES DAILY AS NEEDED FOR ANXIETY  . ARIPiprazole (ABILIFY) 2 MG tablet Take 1 tablet (2 mg total) by mouth daily.  Marland Kitchen aspirin EC 81 MG tablet Take 1 tablet (81 mg total) by mouth daily.  . Blood Glucose Monitoring Suppl (ACCU-CHEK GUIDE ME) w/Device KIT 1 Act by Does not apply route 3 (three) times daily as needed.  . Continuous Blood Gluc Receiver (FREESTYLE LIBRE 14 DAY READER) DEVI 1 Act by Does not apply route daily.  . Continuous Blood Gluc Sensor (FREESTYLE LIBRE 14 DAY SENSOR) MISC 1 Act by Does not apply route daily.  . divalproex (DEPAKOTE) 500 MG DR tablet Take 1 tablet (500 mg total) by mouth 2 (two) times daily.  Marland Kitchen ezetimibe (ZETIA) 10 MG tablet Take 1 tablet (10 mg total) by mouth daily.  Marland Kitchen icosapent Ethyl (VASCEPA) 1 g capsule Take 2 capsules (2 g total) by mouth 2 (two) times daily.  Marland Kitchen levothyroxine (SYNTHROID) 112 MCG tablet Take 1 tablet (112 mcg total) by mouth daily.  Marland Kitchen omeprazole (PRILOSEC) 40 MG capsule Take 1 capsule (40 mg total) by mouth daily. TAKE 2 CAPSULES BY  MOUTH ONCE A DAY  . pioglitazone (ACTOS) 15 MG tablet Take 1 tablet (15 mg total) by mouth daily.  . potassium chloride SA (KLOR-CON) 20 MEQ tablet Take 1 tablet (20 mEq total) by mouth 2 (two) times daily.  . [DISCONTINUED] omega-3 acid ethyl esters (LOVAZA) 1 g capsule Take 2 capsules (2 g total) by mouth 2 (two) times daily.   No facility-administered encounter medications on file as of 01/14/2020.    Functional Status:  In your present state of health, do you have any difficulty performing the following activities: 10/02/2019  Hearing? N  Vision? N  Difficulty concentrating or making decisions? Y  Comment forgetful at times  Walking or climbing stairs? N  Dressing or bathing? N  Doing errands, shopping? N  Preparing Food and eating ? N  Using the Toilet? N  In the past six months, have you accidently leaked urine? N  Do you have problems with loss of bowel control? N  Managing your Medications? N  Managing your Finances? N  Housekeeping or managing your Housekeeping? N  Some recent data might be hidden    Fall/Depression Screening: Fall Risk  10/02/2019 09/19/2019 08/05/2019  Falls in the past year? 1 1 1   Number falls in past yr: 0 0 0  Injury with Fall? 1 1 1   Risk for fall due to : History of fall(s) History of fall(s) History of  fall(s)  Follow up Falls prevention discussed Falls evaluation completed;Falls prevention discussed Falls evaluation completed;Education provided   [facility] 2/9 Scores 01/14/2020 10/02/2019 07/22/2019 04/11/2019 07/25/2018 01/17/2018 09/03/2017  PHQ - 2 Score 1 3 1 2  0 0 0  PHQ- 9 Score - 13 4 4 2  0 -    Assessment: Patient continues to manage chronic conditions and sees physicians as scheduled.  Goals Addressed            This Visit's Progress   . THN-Make and Keep All Appointments       Timeframe:  Long-Range Goal Priority:  High Start Date:  01/14/20                           Expected End Date:  05/06/20                      - arrange a ride  through an agency 1 week before appointment - keep a calendar with appointment dates    Why is this important?    Part of staying healthy is seeing the doctor for follow-up care.   If you forget your appointments, there are some things you can do to stay on track.    Notes:     . THN-Monitor and Manage My Blood Sugar-Diabetes Type 2       Timeframe:  Long-Range Goal Priority:  High Start Date:  01/14/20                           Expected End Date:  05/06/20                      - check blood sugar at prescribed times - take the blood sugar log to all doctor visits    Why is this important?    Checking your blood sugar at home helps to keep it from getting very high or very low.   Writing the results in a diary or log helps the doctor know how to care for you.   Your blood sugar log should have the time, date and the results.   Also, write down the amount of insulin or other medicine that you take.   Other information, like what you ate, exercise done and how you were feeling, will also be helpful.     Notes:        Plan: RN CM will contact patient next month.   Follow-up:  Patient agrees to Care Plan and Follow-up.   Jone Baseman, RN, MSN [facility] Care Management Coordinator Direct Line [phone] Cell [phone] Toll Free: [phone]  Fax: [phone]

## 2020-01-15 ENCOUNTER — Ambulatory Visit (HOSPITAL_COMMUNITY)
Admission: RE | Admit: 2020-01-15 | Discharge: 2020-01-15 | Disposition: A | Discharge status: Home / Self Care | Payer: Medicare HMO | Source: Ambulatory Visit | Attending: Surgery | Admitting: Surgery

## 2020-01-15 ENCOUNTER — Other Ambulatory Visit (HOSPITAL_COMMUNITY): Payer: Self-pay | Admitting: Surgery

## 2020-01-15 ENCOUNTER — Other Ambulatory Visit: Payer: Self-pay

## 2020-01-15 DIAGNOSIS — K838 Other specified diseases of biliary tract: Secondary | ICD-10-CM | POA: Diagnosis not present

## 2020-01-15 DIAGNOSIS — K8050 Calculus of bile duct without cholangitis or cholecystitis without obstruction: Secondary | ICD-10-CM | POA: Diagnosis not present

## 2020-01-15 DIAGNOSIS — R935 Abnormal findings on diagnostic imaging of other abdominal regions, including retroperitoneum: Secondary | ICD-10-CM | POA: Diagnosis not present

## 2020-01-15 MED ORDER — GADOBUTROL 1 MMOL/ML IV SOLN
7.0000 mL | Freq: Once | INTRAVENOUS | Status: AC | PRN
  Administered 2020-01-15: 7 mL via INTRAVENOUS

## 2020-01-19 ENCOUNTER — Other Ambulatory Visit: Payer: Self-pay | Admitting: Internal Medicine

## 2020-01-19 DIAGNOSIS — F418 Other specified anxiety disorders: Secondary | ICD-10-CM

## 2020-01-20 ENCOUNTER — Other Ambulatory Visit: Payer: Self-pay | Admitting: Internal Medicine

## 2020-01-20 MED FILL — ALPRAZOLAM 1 MG TABS: 1 | 30 days supply | Qty: 90 | Fill #0

## 2020-01-21 LAB — HM DIABETES EYE EXAM

## 2020-02-02 ENCOUNTER — Telehealth: Payer: Self-pay | Admitting: Internal Medicine

## 2020-02-02 DIAGNOSIS — E039 Hypothyroidism, unspecified: Secondary | ICD-10-CM

## 2020-02-02 MED ORDER — LEVOTHYROXINE SODIUM 112 MCG PO TABS: 112.0000 ug | ORAL_TABLET | Freq: Every day | ORAL | 0 refills | Status: DC

## 2020-02-02 MED FILL — buPROPion HCL ER (XL) 150 M: 150 | 90 days supply | Qty: 90 | Fill #1

## 2020-02-02 NOTE — Telephone Encounter (Signed)
   Pharmacy calling, states patient only wants filled as brand name  levothyroxine (SYNTHROID) 112 MCG tablet 90 tablet 0 02/02/2020    Sig - Route: TAKE 1 TABLET (112 MCG TOTAL) BY MOUTH DAILY. - Oral   Sent to pharmacy as: levothyroxine (SYNTHROID) 112 MCG tablet   Notes to Pharmacy: PATIENT WANTS BRAND REFILLED.

## 2020-02-02 NOTE — Addendum Note (Signed)
Addended by: Hinda Kehr on: 02/02/2020 11:27 AM   Modules accepted: Orders

## 2020-02-07 MED FILL — RYBELSUS 7 MG TABS: 7 | 30 days supply | Qty: 30 | Fill #2

## 2020-02-20 ENCOUNTER — Other Ambulatory Visit (HOSPITAL_COMMUNITY): Payer: Self-pay | Admitting: Internal Medicine

## 2020-02-20 DIAGNOSIS — E782 Mixed hyperlipidemia: Secondary | ICD-10-CM | POA: Diagnosis not present

## 2020-02-20 DIAGNOSIS — E1165 Type 2 diabetes mellitus with hyperglycemia: Secondary | ICD-10-CM | POA: Diagnosis not present

## 2020-02-20 DIAGNOSIS — E039 Hypothyroidism, unspecified: Secondary | ICD-10-CM | POA: Diagnosis not present

## 2020-02-20 MED FILL — RYBELSUS 3 MG TABS: 3 | 30 days supply | Qty: 30 | Fill #0

## 2020-02-20 MED FILL — SYNTHROID 137 MCG TABLET: 137 | 30 days supply | Qty: 30 | Fill #0

## 2020-02-24 ENCOUNTER — Other Ambulatory Visit: Payer: Self-pay

## 2020-02-24 NOTE — Patient Outreach (Signed)
[facility]) Care Management  02/24/2020  Tasha [name] 1961-01-01 [id number]   Telephone call to patient for disease management follow up.   No answer.  HIPAA compliant voice message left.    Plan: If no return call, RN CM will attempt patient again in the month of March.  Jone Baseman, RN, MSN [place] Management Care Management Coordinator Direct Line [phone] Cell [phone] Toll Free: [phone]  Fax: [phone]

## 2020-02-27 ENCOUNTER — Other Ambulatory Visit: Payer: Self-pay

## 2020-02-27 ENCOUNTER — Ambulatory Visit
Admission: EM | Admit: 2020-02-27 | Discharge: 2020-02-27 | Disposition: A | Discharge status: Home / Self Care | Payer: Medicare Other | Attending: Emergency Medicine | Admitting: Emergency Medicine

## 2020-02-27 DIAGNOSIS — K1370 Unspecified lesions of oral mucosa: Secondary | ICD-10-CM | POA: Diagnosis not present

## 2020-02-27 DIAGNOSIS — H5789 Other specified disorders of eye and adnexa: Secondary | ICD-10-CM | POA: Diagnosis not present

## 2020-02-27 DIAGNOSIS — J3489 Other specified disorders of nose and nasal sinuses: Secondary | ICD-10-CM | POA: Diagnosis not present

## 2020-02-27 MED ORDER — TRIAMCINOLONE ACETONIDE 0.1 % MT PSTE: 1.0000 "application " | PASTE | Freq: Two times a day (BID) | OROMUCOSAL | 12 refills | Status: DC

## 2020-02-27 MED ORDER — OLOPATADINE HCL 0.1 % OP SOLN: 1.0000 [drp] | Freq: Two times a day (BID) | OPHTHALMIC | 0 refills | Status: DC

## 2020-02-27 MED ORDER — MUPIROCIN 2 % EX OINT: 1.0000 "application " | TOPICAL_OINTMENT | Freq: Two times a day (BID) | CUTANEOUS | 0 refills | Status: DC

## 2020-02-27 MED ORDER — CLOTRIMAZOLE 10 MG MT TROC: 10.0000 mg | Freq: Every day | OROMUCOSAL | 0 refills | Status: DC

## 2020-02-27 MED ORDER — VALACYCLOVIR HCL 1 G PO TABS: 1000.0000 mg | ORAL_TABLET | Freq: Two times a day (BID) | ORAL | 0 refills | Status: AC

## 2020-02-27 NOTE — Discharge Instructions (Signed)
Please use clotrimazole lozenges 5 times daily to help with any fungal cause Begin Valtrex twice daily for 1 week to treat cold sores May apply triamcinolone paste into sores on mouth Bactroban/mupirocin ointment into nose Olopatadine twice daily into eyes Follow-up if not improving or worsening

## 2020-02-27 NOTE — ED Triage Notes (Signed)
Patient states that yesterday she woke with a fever blister and eye pain. Pt states her eyes feel sore and her vision is blurry intermittently. Pt states her vision may be blurry now. Pt is aox4 and ambulatory.

## 2020-02-27 NOTE — ED Provider Notes (Signed)
[name]    CSN: [id number] Arrival date & time: 02/27/20  1913      History   Chief Complaint Chief Complaint  Patient presents with  . Wound Check    Fever blister upper lip x 2 days  . Eye Problem    X 2 days, blurry vision    HPI Tasha [name] is a 60 y.o. female presenting today for evaluation of a fever blister.  Reports cold sore to her upper lip/nose.  Has been present for approximately 2 days.  She also reports associated eye blurriness and soreness.  Reports painful sores in her mouth.  She is concerned about this progressing and worsening.  Blurriness is intermittent.  Associated soreness to eyes.  This morning eyes were slightly matted.  Denies contact use.  Reports history of similar approximately 10 days ago, cannot recall exactly what she was treated for.  Reports being prescribed this by pulmonologist, on chart review, unable to find exact note, can see that she was previously prescribed clotrimazole troches.   HPI  Past Medical History:  Diagnosis Date  . Allergy   . Anemia    past hx   . Anxiety state, unspecified   . Arthralgia of temporomandibular joint   . Arthritis   . Asthma    as a child , not now per pt   . Conversion disorder   . Dementia ([place])   . Depressive disorder, not elsewhere classified   . Diabetes mellitus without complication ([place])    off all medicines as of 04-16-2019 PV   . Dysphagia, unspecified(787.20)   . Endometriosis   . Esophageal reflux   . Fibromyalgia   . Headache(784.0)   . Heart murmur    past hx murmur   . Hx of adenomatous colonic polyps   . Irritable bowel syndrome   . Mitral valve disorders(424.0)   . Myalgia and myositis, unspecified   . Overweight(278.02)   . Periodic limb movement disorder   . Pure hypercholesterolemia   . RLS (restless legs syndrome)   . Seizures ([place])    last episode 5 yrs ago   . Stroke [facility])    TIA per pt many years ago   . Unspecified hypothyroidism     Patient  Active Problem List   Diagnosis Date Noted  . Common bile duct dilation 01/09/2020  . Acute hypokalemia 12/25/2019  . Epigastric abdominal tenderness without rebound tenderness 12/23/2019  . Thrombocytopenia ([place]) 12/23/2019  . Severe episode of recurrent major depressive disorder, with psychotic features ([place]) 10/08/2019  . Urticaria, acute 08/12/2019  . Hyperglyceridemia, pure 08/07/2018  . Polyp of colon 07/25/2018  . Esophageal dysphagia 07/25/2018  . Lumbar pain 10/18/2017  . HIV antibody positive ([place]) 09/27/2017  . Mild intermittent asthma without complication 02/08/7251  . Primary insomnia 04/10/2016  . Extremity atherosclerosis with intermittent claudication ([place]) 04/10/2016  . Routine general medical examination at a health care facility 12/20/2014  . Visit for screening mammogram 12/16/2014  . Cervical cancer screening 12/16/2014  . Primary osteoarthritis of both knees 06/22/2014  . Migraine without aura and with status migrainosus, not intractable 02/03/2014  . Nonspecific abnormal electrocardiogram (ECG) (EKG) 12/01/2013  . Type II diabetes mellitus with manifestations ([place]) 09/11/2013  . Estrogen deficiency 05/20/2013  . Vitamin D deficiency 04/23/2013  . OBESITY, MILD 06/24/2008  . Hyperlipidemia with target LDL less than 130 09/08/2007  . Periodic limb movement disorder 09/08/2007  . CIGARETTE SMOKER 09/04/2007  . Hypothyroidism 11/22/2006  .  Depression with anxiety 11/22/2006  . GERD 11/22/2006  . IBS 11/22/2006  . Nonalcoholic steatohepatitis ([facility]) 11/22/2006    Past Surgical History:  Procedure Laterality Date  . ABDOMINAL HYSTERECTOMY     TAH  . arm surgery    . COLONOSCOPY  04/14/2008  . FOOT SURGERY    . LIPOMA EXCISION     BESIDES BELLY BUTTON  . POLYPECTOMY     HPP x 1  . TONSILLECTOMY    . UPPER GASTROINTESTINAL ENDOSCOPY  2010   gessner   . WRIST SURGERY      OB History    Gravida  5   Para  2   Term      Preterm      AB  3    Living  2     SAB  2   IAB      Ectopic  1   Multiple      Live Births               Home Medications    Prior to Admission medications   Medication Sig Start Date End Date Taking? Authorizing Provider  ALPRAZolam Duanne Moron) 1 MG tablet TAKE 1 TABLET BY MOUTH 3 TIMES DAILY AS NEEDED FOR ANXIETY 01/20/20  Yes Janith Lima, MD  aspirin EC 81 MG tablet Take 1 tablet (81 mg total) by mouth daily. 07/26/18  Yes Janith Lima, MD  Blood Glucose Monitoring Suppl (ACCU-CHEK GUIDE ME) w/Device KIT 1 Act by Does not apply route 3 (three) times daily as needed. 08/12/19  Yes Janith Lima, MD  clotrimazole (MYCELEX) 10 MG troche Take 1 tablet (10 mg total) by mouth 5 (five) times daily. 02/27/20  Yes Braniya Farrugia C, PA-C  Continuous Blood Gluc Receiver (FREESTYLE LIBRE 14 DAY READER) DEVI 1 Act by Does not apply route daily. 08/12/19  Yes Janith Lima, MD  Continuous Blood Gluc Sensor (FREESTYLE LIBRE 14 DAY SENSOR) MISC 1 Act by Does not apply route daily. 10/08/19  Yes Janith Lima, MD  ezetimibe (ZETIA) 10 MG tablet Take 1 tablet (10 mg total) by mouth daily. 10/10/19  Yes Janith Lima, MD  icosapent Ethyl (VASCEPA) 1 g capsule Take 2 capsules (2 g total) by mouth 2 (two) times daily. 10/27/19  Yes Janith Lima, MD  levothyroxine (SYNTHROID) 112 MCG tablet Take 1 tablet (112 mcg total) by mouth daily. PATIENT WANTS BRAND NAME. DAW. 02/02/20  Yes Janith Lima, MD  mupirocin ointment (BACTROBAN) 2 % Apply 1 application topically 2 (two) times daily. To nose 02/27/20  Yes Ciel Chervenak C, PA-C  olopatadine (PATANOL) 0.1 % ophthalmic solution Place 1 drop into both eyes 2 (two) times daily. 02/27/20  Yes Riker Collier C, PA-C  omeprazole (PRILOSEC) 40 MG capsule Take 1 capsule (40 mg total) by mouth daily. TAKE 2 CAPSULES BY MOUTH ONCE A DAY 10/27/19  Yes Janith Lima, MD  pioglitazone (ACTOS) 15 MG tablet Take 1 tablet (15 mg total) by mouth daily. 10/27/19  Yes Janith Lima,  MD  potassium chloride SA (KLOR-CON) 20 MEQ tablet Take 1 tablet (20 mEq total) by mouth 2 (two) times daily. 12/25/19  Yes Janith Lima, MD  triamcinolone (KENALOG) 0.1 % paste Use as directed 1 application in the mouth or throat 2 (two) times daily. To mouth sores 02/27/20  Yes Mykal Kirchman C, PA-C  valACYclovir (VALTREX) 1000 MG tablet Take 1 tablet (1,000 mg total) by mouth 2 (two)  times daily for 7 days. 02/27/20 03/05/20 Yes Jayquon Theiler C, PA-C  ARIPiprazole (ABILIFY) 2 MG tablet Take 1 tablet (2 mg total) by mouth daily. 10/10/19 02/27/20  Janith Lima, MD  divalproex (DEPAKOTE) 500 MG DR tablet Take 1 tablet (500 mg total) by mouth 2 (two) times daily. 10/10/19 02/27/20  Janith Lima, MD  omega-3 acid ethyl esters (LOVAZA) 1 g capsule Take 2 capsules (2 g total) by mouth 2 (two) times daily. 04/18/19 10/10/19  Janith Lima, MD    Family History Family History  Problem Relation Age of Onset  . Heart disease Mother   . Heart disease Father   . Diabetes Father   . Colon cancer Father 69       died age 60 from colon cancer spreading per pt   . Breast cancer Maternal Aunt   . Colon cancer Paternal Aunt   . Stomach cancer Paternal Aunt   . Lung cancer Paternal Aunt   . Rectal cancer Paternal Aunt   . Stomach cancer Paternal Uncle   . Colon polyps Neg Hx   . Esophageal cancer Neg Hx     Social History Social History   Tobacco Use  . Smoking status: Current Some Day Smoker    Packs/day: 1.00    Types: Cigarettes  . Smokeless tobacco: Never Used  . Tobacco comment: 5 or 6 per day  Vaping Use  . Vaping Use: Some days  . Substances: Nicotine, Flavoring  Substance Use Topics  . Alcohol use: Yes    Comment: rarely  . Drug use: No     Allergies   Farxiga [dapagliflozin], Metformin and related, Statins, Augmentin [amoxicillin-pot clavulanate], Crestor [rosuvastatin], Lipitor [atorvastatin], Zocor [simvastatin], and Ozempic (0.25 or 0.5 mg-dose) [semaglutide(0.25 or  0.3m-dos)]   Review of Systems Review of Systems  Constitutional: Negative for fatigue and fever.  HENT: Positive for mouth sores.   Eyes: Positive for visual disturbance. Negative for photophobia, pain, discharge, redness and itching.  Respiratory: Negative for shortness of breath.   Cardiovascular: Negative for chest pain.  Gastrointestinal: Negative for abdominal pain, nausea and vomiting.  Genitourinary: Negative for genital sores.  Musculoskeletal: Negative for arthralgias and joint swelling.  Skin: Positive for color change and rash. Negative for wound.  Neurological: Negative for dizziness, weakness, light-headedness and headaches.     Physical Exam Triage Vital Signs ED Triage Vitals  Enc Vitals Group     BP 02/27/20 1929 (!) 137/57     Pulse Rate 02/27/20 1929 60     Resp 02/27/20 1929 18     Temp 02/27/20 1929 98.1 F (36.7 C)     Temp Source 02/27/20 1929 Oral     SpO2 02/27/20 1929 96 %     Weight --      Height --      Head Circumference --      Peak Flow --      Pain Score 02/27/20 1934 2     Pain Loc --      Pain Edu? --      Excl. in GBent --    No data found.  Updated Vital Signs BP (!) 137/57   Pulse 60   Temp 98.1 F (36.7 C) (Oral)   Resp 18   SpO2 96%   Visual Acuity Right Eye Distance:   Left Eye Distance:   Bilateral Distance:    Right Eye Near:   Left Eye Near:    Bilateral Near:  Physical Exam Vitals and nursing note reviewed.  Constitutional:      Appearance: She is well-developed and well-nourished.     Comments: No acute distress  HENT:     Head: Normocephalic and atraumatic.     Ears:     Comments: Bilateral ears without tenderness to palpation of external auricle, tragus and mastoid, EAC's without erythema or swelling, TM's with good bony landmarks and cone of light. Non erythematous.     Nose: Nose normal.     Comments: Small abrasion noted just within left nares, nasal mucosa pink    Mouth/Throat:     Comments:  Oral mucosa pink and moist, no tonsillar enlargement or exudate. Posterior pharynx patent and nonerythematous, no uvula deviation or swelling. Normal phonation.  Upper and lower lip mucosa with very faint discoloration in circular area, faint white color, no obvious aphthous ulcers  Eyes:     Extraocular Movements: Extraocular movements intact.     Conjunctiva/sclera: Conjunctivae normal.     Pupils: Pupils are equal, round, and reactive to light.     Comments: Bilateral conjunctiva without erythema, no discharge noted, anterior chamber clear, no photophobia with exam  Cardiovascular:     Rate and Rhythm: Normal rate.  Pulmonary:     Effort: Pulmonary effort is normal. No respiratory distress.  Abdominal:     General: There is no distension.  Musculoskeletal:        General: Normal range of motion.     Cervical back: Neck supple.  Skin:    General: Skin is warm and dry.  Neurological:     Mental Status: She is alert and oriented to person, place, and time.  Psychiatric:        Mood and Affect: Mood and affect normal.      UC Treatments / Results  Labs (all labs ordered are listed, but only abnormal results are displayed) Labs Reviewed - No data to display  EKG   Radiology No results found.  Procedures Procedures (including critical care time)  Medications Ordered in UC Medications - No data to display  Initial Impression / Assessment and Plan / UC Course  I have reviewed the triage vital signs and the nursing notes.  Pertinent labs & imaging results that were available during my care of the patient were reviewed by me and considered in my medical decision making (see chart for details).     1. mouth sores- do not appear classic canker sores or HSV lesions, but feel this is most likely cause, placing on Valtrex, and past has been prescribed clotrimazole troches, given tobacco history opting to prescribe these as well.  Kenalog to apply topically to help with  discomfort.  2.  Small abrasion noted within left nares, Bactroban topically, no signs of abscess at this time  3.  Eye irritation-eye exam unremarkable, providing olopatadine to use for symptomatic and supportive care  Continue to monitor, if symptoms/lesions changing or worsening follow-up for recheck  Discussed strict return precautions. Patient verbalized understanding and is agreeable with plan.  Final Clinical Impressions(s) / UC Diagnoses   Final diagnoses:  Mouth lesion  Sore in nose  Eye irritation     Discharge Instructions     Please use clotrimazole lozenges 5 times daily to help with any fungal cause Begin Valtrex twice daily for 1 week to treat cold sores May apply triamcinolone paste into sores on mouth Bactroban/mupirocin ointment into nose Olopatadine twice daily into eyes Follow-up if not improving or worsening  ED Prescriptions    Medication Sig Dispense Auth. Provider   mupirocin ointment (BACTROBAN) 2 % Apply 1 application topically 2 (two) times daily. To nose 30 g Zylon Creamer C, PA-C   triamcinolone (KENALOG) 0.1 % paste Use as directed 1 application in the mouth or throat 2 (two) times daily. To mouth sores 5 g Telly Jawad C, PA-C   olopatadine (PATANOL) 0.1 % ophthalmic solution Place 1 drop into both eyes 2 (two) times daily. 5 mL Mckade Gurka C, PA-C   valACYclovir (VALTREX) 1000 MG tablet Take 1 tablet (1,000 mg total) by mouth 2 (two) times daily for 7 days. 14 tablet Cailen Texeira C, PA-C   clotrimazole (MYCELEX) 10 MG troche Take 1 tablet (10 mg total) by mouth 5 (five) times daily. 35 Troche Shaunak Kreis, [place] C, PA-C     PDMP not reviewed this encounter.   Janith Lima, PA-C 02/27/20 2005

## 2020-02-28 ENCOUNTER — Telehealth: Payer: Medicare HMO | Admitting: Internal Medicine

## 2020-03-05 MED FILL — SERTRALINE HCL 100 MG TABS: 100 | 90 days supply | Qty: 180 | Fill #1

## 2020-03-05 MED FILL — buPROPion HCL ER (XL) 150 M: 150 | 90 days supply | Qty: 90 | Fill #1

## 2020-03-05 MED FILL — ALPRAZOLAM 1 MG TABS: 1 | 30 days supply | Qty: 90 | Fill #1

## 2020-03-25 ENCOUNTER — Other Ambulatory Visit: Payer: Self-pay

## 2020-03-25 ENCOUNTER — Ambulatory Visit (INDEPENDENT_AMBULATORY_CARE_PROVIDER_SITE_OTHER): Payer: Medicare Other

## 2020-03-25 VITALS — BP 120/80 | HR 60 | Temp 98.0°F | Ht 67.0 in | Wt 159.4 lb

## 2020-03-25 DIAGNOSIS — Z0000 Encounter for general adult medical examination without abnormal findings: Secondary | ICD-10-CM

## 2020-03-25 NOTE — Patient Instructions (Signed)
Tasha [name] , Thank you for taking time to come for your Medicare Wellness Visit. I appreciate your ongoing commitment to your health goals. Please review the following plan we discussed and let me know if I can assist you in the future.   Screening recommendations/referrals: Colonoscopy: 06/23/2019; due every year (06/2020) Mammogram: 01/17/2019; due every year Bone Density: 01/17/2019; due every 2 years Recommended yearly ophthalmology/optometry visit for glaucoma screening and checkup Recommended yearly dental visit for hygiene and checkup  Vaccinations: Influenza vaccine: 10/08/2019 Pneumococcal vaccine: 03/19/2017; need Prevnar 13 at age 65 Tdap vaccine: 08/17/2011; due every 10 years (08/2021) Shingles vaccine: never done; can check with local pharmacy for vaccination  Covid-19: 10/10/2019, 11/21/2019  Advanced directives: Please bring a copy of your health care power of attorney and living will to the office at your convenience.  Conditions/risks identified: Yes; Reviewed health maintenance screenings with patient today and relevant education, vaccines, and/or referrals were provided. Please continue to do your personal lifestyle choices by: daily care of teeth and gums, regular physical activity (goal should be 5 days a week for 30 minutes), eat a healthy diet, avoid tobacco and drug use, limiting any alcohol intake, taking a low-dose aspirin (if not allergic or have been advised by your provider otherwise) and taking vitamins and minerals as recommended by your provider. Continue doing brain stimulating activities (puzzles, reading, adult coloring books, staying active) to keep memory sharp. Continue to eat heart healthy diet (full of fruits, vegetables, whole grains, lean protein, water--limit salt, fat, and sugar intake) and increase physical activity as tolerated.  Next appointment: Please schedule your next Medicare Wellness Visit with your Nurse Health Advisor in 1 year by calling  [phone].   Preventive Care 40-64 Years, Female Preventive care refers to lifestyle choices and visits with your health care provider that can promote health and wellness. What does preventive care include?  A yearly physical exam. This is also called an annual well check.  Dental exams once or twice a year.  Routine eye exams. Ask your health care provider how often you should have your eyes checked.  Personal lifestyle choices, including:  Daily care of your teeth and gums.  Regular physical activity.  Eating a healthy diet.  Avoiding tobacco and drug use.  Limiting alcohol use.  Practicing safe sex.  Taking low-dose aspirin daily starting at age 45.  Taking vitamin and mineral supplements as recommended by your health care provider. What happens during an annual well check? The services and screenings done by your health care provider during your annual well check will depend on your age, overall health, lifestyle risk factors, and family history of disease. Counseling  Your health care provider may ask you questions about your:  Alcohol use.  Tobacco use.  Drug use.  Emotional well-being.  Home and relationship well-being.  Sexual activity.  Eating habits.  Work and work Statistician.  Method of birth control.  Menstrual cycle.  Pregnancy history. Screening  You may have the following tests or measurements:  Height, weight, and BMI.  Blood pressure.  Lipid and cholesterol levels. These may be checked every 5 years, or more frequently if you are over 12 years old.  Skin check.  Lung cancer screening. You may have this screening every year starting at age 74 if you have a 30-pack-year history of smoking and currently smoke or have quit within the past 15 years.  Fecal occult blood test (FOBT) of the stool. You may have this test every year starting  at age 56.  Flexible sigmoidoscopy or colonoscopy. You may have a sigmoidoscopy every 5 years  or a colonoscopy every 10 years starting at age 37.  Hepatitis C blood test.  Hepatitis B blood test.  Sexually transmitted disease (STD) testing.  Diabetes screening. This is done by checking your blood sugar (glucose) after you have not eaten for a while (fasting). You may have this done every 1-3 years.  Mammogram. This may be done every 1-2 years. Talk to your health care provider about when you should start having regular mammograms. This may depend on whether you have a family history of breast cancer.  BRCA-related cancer screening. This may be done if you have a family history of breast, ovarian, tubal, or peritoneal cancers.  Pelvic exam and Pap test. This may be done every 3 years starting at age 36. Starting at age 9, this may be done every 5 years if you have a Pap test in combination with an HPV test.  Bone density scan. This is done to screen for osteoporosis. You may have this scan if you are at high risk for osteoporosis. Discuss your test results, treatment options, and if necessary, the need for more tests with your health care provider. Vaccines  Your health care provider may recommend certain vaccines, such as:  Influenza vaccine. This is recommended every year.  Tetanus, diphtheria, and acellular pertussis (Tdap, Td) vaccine. You may need a Td booster every 10 years.  Zoster vaccine. You may need this after age 46.  Pneumococcal 13-valent conjugate (PCV13) vaccine. You may need this if you have certain conditions and were not previously vaccinated.  Pneumococcal polysaccharide (PPSV23) vaccine. You may need one or two doses if you smoke cigarettes or if you have certain conditions. Talk to your health care provider about which screenings and vaccines you need and how often you need them. This information is not intended to replace advice given to you by your health care provider. Make sure you discuss any questions you have with your health care  provider. Document Released: 02/19/2015 Document Revised: 10/13/2015 Document Reviewed: 11/24/2014 Elsevier Interactive Patient Education  2017 [place] Prevention in the Home Falls can cause injuries. They can happen to people of all ages. There are many things you can do to make your home safe and to help prevent falls. What can I do on the outside of my home?  Regularly fix the edges of walkways and driveways and fix any cracks.  Remove anything that might make you trip as you walk through a door, such as a raised step or threshold.  Trim any bushes or trees on the path to your home.  Use bright outdoor lighting.  Clear any walking paths of anything that might make someone trip, such as rocks or tools.  Regularly check to see if handrails are loose or broken. Make sure that both sides of any steps have handrails.  Any raised decks and porches should have guardrails on the edges.  Have any leaves, snow, or ice cleared regularly.  Use sand or salt on walking paths during winter.  Clean up any spills in your garage right away. This includes oil or grease spills. What can I do in the bathroom?  Use night lights.  Install grab bars by the toilet and in the tub and shower. Do not use towel bars as grab bars.  Use non-skid mats or decals in the tub or shower.  If you need to sit  down in the shower, use a plastic, non-slip stool.  Keep the floor dry. Clean up any water that spills on the floor as soon as it happens.  Remove soap buildup in the tub or shower regularly.  Attach bath mats securely with double-sided non-slip rug tape.  Do not have throw rugs and other things on the floor that can make you trip. What can I do in the bedroom?  Use night lights.  Make sure that you have a light by your bed that is easy to reach.  Do not use any sheets or blankets that are too big for your bed. They should not hang down onto the floor.  Have a firm chair that  has side arms. You can use this for support while you get dressed.  Do not have throw rugs and other things on the floor that can make you trip. What can I do in the kitchen?  Clean up any spills right away.  Avoid walking on wet floors.  Keep items that you use a lot in easy-to-reach places.  If you need to reach something above you, use a strong step stool that has a grab bar.  Keep electrical cords out of the way.  Do not use floor polish or wax that makes floors slippery. If you must use wax, use non-skid floor wax.  Do not have throw rugs and other things on the floor that can make you trip. What can I do with my stairs?  Do not leave any items on the stairs.  Make sure that there are handrails on both sides of the stairs and use them. Fix handrails that are broken or loose. Make sure that handrails are as long as the stairways.  Check any carpeting to make sure that it is firmly attached to the stairs. Fix any carpet that is loose or worn.  Avoid having throw rugs at the top or bottom of the stairs. If you do have throw rugs, attach them to the floor with carpet tape.  Make sure that you have a light switch at the top of the stairs and the bottom of the stairs. If you do not have them, ask someone to add them for you. What else can I do to help prevent falls?  Wear shoes that:  Do not have high heels.  Have rubber bottoms.  Are comfortable and fit you well.  Are closed at the toe. Do not wear sandals.  If you use a stepladder:  Make sure that it is fully opened. Do not climb a closed stepladder.  Make sure that both sides of the stepladder are locked into place.  Ask someone to hold it for you, if possible.  Clearly mark and make sure that you can see:  Any grab bars or handrails.  First and last steps.  Where the edge of each step is.  Use tools that help you move around (mobility aids) if they are needed. These  include:  Canes.  Walkers.  Scooters.  Crutches.  Turn on the lights when you go into a dark area. Replace any light bulbs as soon as they burn out.  Set up your furniture so you have a clear path. Avoid moving your furniture around.  If any of your floors are uneven, fix them.  If there are any pets around you, be aware of where they are.  Review your medicines with your doctor. Some medicines can make you feel dizzy. This can increase your chance of  falling. Ask your doctor what other things that you can do to help prevent falls. This information is not intended to replace advice given to you by your health care provider. Make sure you discuss any questions you have with your health care provider. Document Released: 11/19/2008 Document Revised: 07/01/2015 Document Reviewed: 02/27/2014 Elsevier Interactive Patient Education  2017 [employer].

## 2020-03-25 NOTE — Progress Notes (Addendum)
Subjective:   Tasha [name] is a 60 y.o. female who presents for Medicare Annual (Subsequent) preventive examination.  Review of Systems    No ROS. Medicare Wellness Visit. Cardiac Risk Factors include: diabetes mellitus;dyslipidemia;family history of premature cardiovascular disease     Objective:    Today's Vitals   03/25/20 1111  BP: 120/80  Pulse: 60  Temp: 98 F (36.7 C)  SpO2: 97%  Weight: 159 lb 6.4 oz (72.3 kg)  Height: 5' 7"  (1.702 m)  PainSc: 0-No pain   Body mass index is 24.97 kg/m.  Advanced Directives 03/25/2020 10/02/2019 10/13/2015 03/26/2015  Does Patient Have a Medical Advance Directive? No No No No  Would patient like information on creating a medical advance directive? No - Patient declined No - Patient declined - No - patient declined information    Current Medications (verified) Outpatient Encounter Medications as of 03/25/2020  Medication Sig   ALPRAZolam (XANAX) 1 MG tablet TAKE 1 TABLET BY MOUTH 3 TIMES DAILY AS NEEDED FOR ANXIETY   aspirin EC 81 MG tablet Take 1 tablet (81 mg total) by mouth daily.   Blood Glucose Monitoring Suppl (ACCU-CHEK GUIDE ME) w/Device KIT 1 Act by Does not apply route 3 (three) times daily as needed.   buPROPion (WELLBUTRIN XL) 150 MG 24 hr tablet Take 150 mg by mouth daily.   clotrimazole (MYCELEX) 10 MG troche Take 1 tablet (10 mg total) by mouth 5 (five) times daily.   ezetimibe (ZETIA) 10 MG tablet Take 1 tablet (10 mg total) by mouth daily.   levothyroxine (SYNTHROID) 112 MCG tablet Take 1 tablet (112 mcg total) by mouth daily. PATIENT WANTS BRAND NAME. DAW.   olopatadine (PATANOL) 0.1 % ophthalmic solution Place 1 drop into both eyes 2 (two) times daily.   omeprazole (PRILOSEC) 40 MG capsule Take 1 capsule (40 mg total) by mouth daily. TAKE 2 CAPSULES BY MOUTH ONCE A DAY   pioglitazone (ACTOS) 15 MG tablet Take 1 tablet (15 mg total) by mouth daily.   potassium chloride SA (KLOR-CON) 20 MEQ tablet Take 1 tablet  (20 mEq total) by mouth 2 (two) times daily.   sertraline (ZOLOFT) 100 MG tablet Take 200 mg by mouth daily.   Continuous Blood Gluc Receiver (FREESTYLE LIBRE 14 DAY READER) DEVI 1 Act by Does not apply route daily. (Patient not taking: Reported on 03/25/2020)   Continuous Blood Gluc Sensor (FREESTYLE LIBRE 14 DAY SENSOR) MISC 1 Act by Does not apply route daily. (Patient not taking: Reported on 03/25/2020)   icosapent Ethyl (VASCEPA) 1 g capsule Take 2 capsules (2 g total) by mouth 2 (two) times daily.   mupirocin ointment (BACTROBAN) 2 % Apply 1 application topically 2 (two) times daily. To nose (Patient not taking: Reported on 03/25/2020)   triamcinolone (KENALOG) 0.1 % paste Use as directed 1 application in the mouth or throat 2 (two) times daily. To mouth sores (Patient not taking: Reported on 03/25/2020)   [DISCONTINUED] ARIPiprazole (ABILIFY) 2 MG tablet Take 1 tablet (2 mg total) by mouth daily.   [DISCONTINUED] divalproex (DEPAKOTE) 500 MG DR tablet Take 1 tablet (500 mg total) by mouth 2 (two) times daily.   [DISCONTINUED] omega-3 acid ethyl esters (LOVAZA) 1 g capsule Take 2 capsules (2 g total) by mouth 2 (two) times daily.   No facility-administered encounter medications on file as of 03/25/2020.    Allergies (verified) Farxiga [dapagliflozin], Metformin and related, Statins, Augmentin [amoxicillin-pot clavulanate], Crestor [rosuvastatin], Lipitor [atorvastatin], Zocor [simvastatin], and  Ozempic (0.25 or 0.5 mg-dose) [semaglutide(0.25 or 0.1m-dos)]   History: Past Medical History:  Diagnosis Date   Allergy    Anemia    past hx    Anxiety state, unspecified    Arthralgia of temporomandibular joint    Arthritis    Asthma    as a child , not now per pt    Conversion disorder    [place]    Depressive disorder, not elsewhere classified    Diabetes mellitus without complication ([place]    off all medicines as of 04-16-2019 PV    Dysphagia, unspecified(787.20)    Endometriosis     Esophageal reflux    Fibromyalgia    Headache(784.0)    Heart murmur    past hx murmur    Hx of adenomatous colonic polyps    Irritable bowel syndrome    Mitral valve disorders(424.0)    Myalgia and myositis, unspecified    Overweight(278.02)    Periodic limb movement disorder    Pure hypercholesterolemia    RLS (restless legs syndrome)    Seizures (HCC)    last episode 5 yrs ago    Stroke ([facility]    TIA per pt many years ago    Unspecified hypothyroidism    Past Surgical History:  Procedure Laterality Date   ABDOMINAL HYSTERECTOMY     TAH   arm surgery     COLONOSCOPY  04/14/2008   FOOT SURGERY     LIPOMA EXCISION     BESIDES BELLY BUTTON   POLYPECTOMY     HPP x 1   TONSILLECTOMY     UPPER GASTROINTESTINAL ENDOSCOPY  2010   [facility]    WRIST SURGERY     Family History  Problem Relation Age of Onset   Heart disease Mother    Heart disease Father    Diabetes Father    Colon cancer Father 718      died age 829from colon cancer spreading per pt    Breast cancer Maternal Aunt    Colon cancer Paternal Aunt    Stomach cancer Paternal Aunt    Lung cancer Paternal Aunt    Rectal cancer Paternal Aunt    Stomach cancer Paternal Uncle    Colon polyps Neg Hx    Esophageal cancer Neg Hx    Social History   Socioeconomic History   Marital status: Married    Spouse name: Tasha [name]   Number of children: 2   Years of education: 12   Highest education level: Not on file  Occupational History   Occupation: Disabled    Employer: [employer]  Tobacco Use   Smoking status: Current Some Day Smoker    Packs/day: 1.00    Types: Cigarettes   Smokeless tobacco: Never Used   Tobacco comment: 1-2 per day  Vaping Use   Vaping Use: Some days   Substances: Nicotine, Flavoring  Substance and Sexual Activity   Alcohol use: Yes    Comment: rarely   Drug use: No   Sexual activity: Yes  Other Topics Concern   Not on file  Social History Narrative   Lives w/  husband Tasha [name]   Caffeine use: 2 cups coffee per day   Tea-3-4 drinks   Right handed    Social Determinants of Health   Financial Resource Strain: Low Risk    Difficulty of Paying Living Expenses: Not hard at all  Food Insecurity: No Food Insecurity   Worried About Running Out  of Food in the Last Year: Never true   [place] in the Last Year: Never true  Transportation Needs: No Transportation Needs   Lack of Transportation (Medical): No   Lack of Transportation (Non-Medical): No  Physical Activity: Not on file  Stress: Stress Concern Present   Feeling of Stress : Very much  Social Connections: Moderately Isolated   Frequency of Communication with Friends and Family: More than three times a week   Frequency of Social Gatherings with Friends and Family: More than three times a week   Attends Religious Services: Never   Marine scientist or Organizations: No   Attends Music therapist: Never   Marital Status: Married    Tobacco Counseling Ready to quit: Not Answered Counseling given: Not Answered Comment: 1-2 per day   Clinical Intake:  Pre-visit preparation completed: Yes  Pain : No/denies pain Pain Score: 0-No pain     BMI - recorded: 24.97 Nutritional Status: BMI of 19-24  Normal Nutritional Risks: None Diabetes: Yes CBG done?: No Did pt. bring in CBG monitor from home?: No  How often do you need to have someone help you when you read instructions, pamphlets, or other written materials from your doctor or pharmacy?: 1 - Never What is the last grade level you completed in school?: High School Graduate; [employer]  Diabetic? yes  Interpreter Needed?: No  Information entered by :: Lisette Abu, LPN   Activities of Daily Living In your present state of health, do you have any difficulty performing the following activities: 03/25/2020 10/02/2019  Hearing? Y N  Comment having some decreased hearing in left ear -   Vision? N N  Difficulty concentrating or making decisions? Y Y  Comment - forgetful at times  Walking or climbing stairs? N N  Dressing or bathing? N N  Doing errands, shopping? N N  Preparing Food and eating ? N N  Using the Toilet? N N  In the past six months, have you accidently leaked urine? N N  Do you have problems with loss of bowel control? N N  Managing your Medications? N N  Managing your Finances? N N  Housekeeping or managing your Housekeeping? N N  Some recent data might be hidden    Patient Care Team: Janith Lima, MD as PCP - General (Internal Medicine) Jon Billings, RN as [place] any recent Medical Services you may have received from other than [facility] providers in the past year (date may be approximate).     Assessment:   This is a routine wellness examination for Bruce.  Hearing/Vision screen No exam data present  Dietary issues and exercise activities discussed: Current Exercise Habits: The patient does not participate in regular exercise at present, Exercise limited by: psychological condition(s)  Goals       Manage diabetes, decrease A1C (pt-stated)      CARE PLAN ENTRY (see longtitudinal plan of care for additional care plan information)  Objective:  Lab Results  Component Value Date   HGBA1C 8.8 (H) 07/22/2019   Lab Results  Component Value Date   CREATININE 0.65 07/22/2019   CREATININE 0.73 05/14/2019   CREATININE 0.96 04/10/2019   No results found for: EGFR  Current Barriers:  Knowledge Deficits related to medications used for management of diabetes Difficulty obtaining or cannot afford medications  Case Manager Clinical Goal(s):  Over the next 90 days, patient will demonstrate improved adherence to prescribed treatment  plan for diabetes self care/management as evidenced by: decreased A1C </=7 Verbalize daily monitoring and recording of CBG within 28 days Verbalize adherence to ADA/  carb modified diet within the next 28 days Verbalize adherence to prescribed medication regimen within the next 28 days   Interventions:  Provided education to patient about basic DM disease process Discussed plans with patient for ongoing care management follow up and provided patient with direct contact information for care management team Reviewed scheduled/upcoming provider appointments including: PCP and endocrinology  Patient Self Care Activities:  Self administers oral medications as prescribed Attends all scheduled provider appointments Checks blood sugars as prescribed and utilize hyper and hypoglycemia protocol as needed  Initial goal documentation       Patient Stated      I would like to lose weight, maybe around 10-15 lbs.      THN-Make and Keep All Appointments      Timeframe:  Long-Range Goal Priority:  High Start Date:  01/14/20                           Expected End Date:  05/06/20                      - arrange a ride through an agency 1 week before appointment - keep a calendar with appointment dates    Why is this important?   Part of staying healthy is seeing the doctor for follow-up care.  If you forget your appointments, there are some things you can do to stay on track.    Notes:       THN-Monitor and Manage My Blood Sugar-Diabetes Type 2      Timeframe:  Long-Range Goal Priority:  High Start Date:  01/14/20                           Expected End Date:  05/06/20                      - check blood sugar at prescribed times - take the blood sugar log to all doctor visits    Why is this important?   Checking your blood sugar at home helps to keep it from getting very high or very low.  Writing the results in a diary or log helps the doctor know how to care for you.  Your blood sugar log should have the time, date and the results.  Also, write down the amount of insulin or other medicine that you take.  Other information, like what you ate, exercise  done and how you were feeling, will also be helpful.     Notes:        Depression Screen PHQ 2/9 Scores 03/25/2020 01/14/2020 10/02/2019 07/22/2019 04/11/2019 07/25/2018 01/17/2018  PHQ - 2 Score 3 1 3 1 2  0 0  PHQ- 9 Score 15 - 13 4 4 2  0    Fall Risk Fall Risk  03/25/2020 10/02/2019 09/19/2019 08/05/2019 07/25/2018  Falls in the past year? 1 1 1 1  0  Number falls in past yr: 0 0 0 0 0  Injury with Fall? 0 1 1 1  0  Risk for fall due to : No Fall Risks History of fall(s) History of fall(s) History of fall(s) -  Follow up - Falls prevention discussed Falls evaluation completed;Falls prevention discussed Falls evaluation completed;Education provided Falls evaluation completed  FALL RISK PREVENTION PERTAINING TO THE HOME:  Any stairs in or around the home? Yes  If so, are there any without handrails? No  Home free of loose throw rugs in walkways, pet beds, electrical cords, etc? Yes  Adequate lighting in your home to reduce risk of falls? Yes   ASSISTIVE DEVICES UTILIZED TO PREVENT FALLS:  Life alert? No  Use of a cane, walker or w/c? No  Grab bars in the bathroom? Yes  Shower chair or bench in shower? No  Elevated toilet seat or a handicapped toilet? Yes   TIMED UP AND GO:  Was the test performed? No .  Length of time to ambulate 10 feet: 0 sec.   Gait steady and fast without use of assistive device  Cognitive Function: Normal cognitive status assessed by direct observation by this Nurse Health Advisor. No abnormalities found.   MMSE - Mini Mental State Exam 09/07/2017  Orientation to time 5  Orientation to Place 5  Registration 3  Attention/ Calculation 5  Recall 3  Language- name 2 objects 2  Language- repeat 1  Language- follow 3 step command 3  Language- read & follow direction 1  Write a sentence 1  Copy design 1  Total score 30        Immunizations Immunization History  Administered Date(s) Administered   Hep A / Hep B 10/24/2016, 11/23/2016, 04/23/2017    Influenza Split 12/06/2010, 02/27/2012   Influenza Whole 11/11/2007, 11/17/2009, 12/14/2009   Influenza,inj,Quad PF,6+ Mos 12/19/2012, 10/24/2016, 01/10/2018, 01/27/2019, 10/08/2019   Influenza-Unspecified 10/18/2014, 03/25/2016   Moderna Sars-Covid-2 Vaccination 10/10/2019, 11/21/2019   Pneumococcal Polysaccharide-23 02/07/2008, 03/19/2017   Tdap 08/17/2011    TDAP status: Up to date  Flu Vaccine status: Up to date  Pneumococcal vaccine status: Up to date  Covid-19 vaccine status: Completed vaccines  Qualifies for Shingles Vaccine? Yes   Zostavax completed No   Shingrix Completed?: No.    Education has been provided regarding the importance of this vaccine. Patient has been advised to call insurance company to determine out of pocket expense if they have not yet received this vaccine. Advised may also receive vaccine at local pharmacy or Health Dept. Verbalized acceptance and understanding.  Screening Tests Health Maintenance  Topic Date Due   OPHTHALMOLOGY EXAM  06/30/2018   COVID-19 Vaccine (3 - Moderna risk 4-dose series) 12/19/2019   FOOT EXAM  01/27/2020   URINE MICROALBUMIN  01/27/2020   HEMOGLOBIN A1C  06/21/2020   COLONOSCOPY (Pts 45-69yr Insurance coverage will need to be confirmed)  06/22/2020   MAMMOGRAM  01/16/2021   TETANUS/TDAP  08/16/2021   INFLUENZA VACCINE  Completed   PNEUMOCOCCAL POLYSACCHARIDE VACCINE AGE 34-64 HIGH RISK  Completed   Hepatitis C Screening  Completed   HIV Screening  Completed    Health Maintenance  Health Maintenance Due  Topic Date Due   OPHTHALMOLOGY EXAM  06/30/2018   COVID-19 Vaccine (3 - Moderna risk 4-dose series) 12/19/2019   FOOT EXAM  01/27/2020   URINE MICROALBUMIN  01/27/2020    Colorectal cancer screening: Type of screening: Colonoscopy. Completed 06/23/2019. Repeat every 1 years  Mammogram status: Completed 01/17/2019. Repeat every year  Bone Density status: Completed 01/17/2019. Results reflect: Bone density  results: OSTEOPENIA. Repeat every 2 years.  Lung Cancer Screening: (Low Dose CT Chest recommended if Age 61-80years, 30 pack-year currently smoking OR have quit w/in 15years.) does qualify.   Lung Cancer Screening Referral: no  Additional Screening:  Hepatitis C Screening: does qualify;  Completed yes  Vision Screening: Recommended annual ophthalmology exams for early detection of glaucoma and other disorders of the eye. Is the patient up to date with their annual eye exam?  Yes  Who is the provider or what is the name of the office in which the patient attends annual eye exams? [facility] in [place], [place] If pt is not established with a provider, would they like to be referred to a provider to establish care? No .   Dental Screening: Recommended annual dental exams for proper oral hygiene  Community Resource Referral / Chronic Care Management: CRR required this visit?  No   CCM required this visit?  No      Plan:     I have personally reviewed and noted the following in the patient's chart:   Medical and social history Use of alcohol, tobacco or illicit drugs  Current medications and supplements Functional ability and status Nutritional status Physical activity Advanced directives List of other physicians Hospitalizations, surgeries, and ER visits in previous 12 months Vitals Screenings to include cognitive, depression, and falls Referrals and appointments  In addition, I have reviewed and discussed with patient certain preventive protocols, quality metrics, and best practice recommendations. A written personalized care plan for preventive services as well as general preventive health recommendations were provided to patient.     Sheral Flow, LPN   5/32/9924   Nurse Notes:  Medications reviewed with patient; no opioid use.   Medical screening examination/treatment/procedure(s) were performed by non-physician practitioner and as  supervising physician I was immediately available for consultation/collaboration.  I agree with above. Lew Dawes, MD

## 2020-04-05 ENCOUNTER — Other Ambulatory Visit (HOSPITAL_COMMUNITY): Payer: Self-pay | Admitting: Family

## 2020-04-05 ENCOUNTER — Encounter: Payer: Self-pay | Admitting: Family

## 2020-04-05 ENCOUNTER — Ambulatory Visit (INDEPENDENT_AMBULATORY_CARE_PROVIDER_SITE_OTHER): Payer: Medicare Other | Admitting: Family

## 2020-04-05 ENCOUNTER — Other Ambulatory Visit: Payer: Self-pay

## 2020-04-05 VITALS — BP 132/82 | HR 64 | Temp 98.3°F | Ht 67.0 in | Wt 163.6 lb

## 2020-04-05 DIAGNOSIS — J3489 Other specified disorders of nose and nasal sinuses: Secondary | ICD-10-CM | POA: Diagnosis not present

## 2020-04-05 DIAGNOSIS — J029 Acute pharyngitis, unspecified: Secondary | ICD-10-CM | POA: Diagnosis not present

## 2020-04-05 MED ORDER — DOXYCYCLINE HYCLATE 100 MG PO TABS: 100.0000 mg | ORAL_TABLET | Freq: Two times a day (BID) | ORAL | 0 refills | Status: DC

## 2020-04-05 MED ORDER — BACTROBAN NASAL 2 % NA OINT: 1.0000 "application " | TOPICAL_OINTMENT | Freq: Two times a day (BID) | NASAL | 0 refills | Status: DC

## 2020-04-05 MED FILL — SYNTHROID 137 MCG TABLET: 137 | 30 days supply | Qty: 30 | Fill #1

## 2020-04-05 MED FILL — MUPIROCIN 2% OINTMENT: 2 | 5 days supply | Qty: 22 | Fill #0

## 2020-04-05 MED FILL — ALPRAZOLAM 1 MG TABS: 1 | 30 days supply | Qty: 90 | Fill #2

## 2020-04-05 MED FILL — OMEPRAZOLE 40 MG CPDR: 40 | 90 days supply | Qty: 180 | Fill #1

## 2020-04-05 MED FILL — DOXYCYCLINE HYCLATE 100 MG: 100 | 7 days supply | Qty: 14 | Fill #0

## 2020-04-05 NOTE — Progress Notes (Signed)
Tasha [name] is a 60 y.o. female with the following history as recorded in EpicCare:  Patient Active Problem List   Diagnosis Date Noted  . Common bile duct dilation 01/09/2020  . Acute hypokalemia 12/25/2019  . Epigastric abdominal tenderness without rebound tenderness 12/23/2019  . Thrombocytopenia ([place]) 12/23/2019  . Severe episode of recurrent major depressive disorder, with psychotic features ([place]) 10/08/2019  . Urticaria, acute 08/12/2019  . Hyperglyceridemia, pure 08/07/2018  . Polyp of colon 07/25/2018  . Esophageal dysphagia 07/25/2018  . Lumbar pain 10/18/2017  . HIV antibody positive ([place]) 09/27/2017  . Mild intermittent asthma without complication 11/30/8525  . Primary insomnia 04/10/2016  . Extremity atherosclerosis with intermittent claudication ([place]) 04/10/2016  . Routine general medical examination at a health care facility 12/20/2014  . Visit for screening mammogram 12/16/2014  . Cervical cancer screening 12/16/2014  . Primary osteoarthritis of both knees 06/22/2014  . Migraine without aura and with status migrainosus, not intractable 02/03/2014  . Nonspecific abnormal electrocardiogram (ECG) (EKG) 12/01/2013  . Type II diabetes mellitus with manifestations ([place]) 09/11/2013  . Estrogen deficiency 05/20/2013  . Vitamin D deficiency 04/23/2013  . OBESITY, MILD 06/24/2008  . Hyperlipidemia with target LDL less than 130 09/08/2007  . Periodic limb movement disorder 09/08/2007  . CIGARETTE SMOKER 09/04/2007  . Hypothyroidism 11/22/2006  . Depression with anxiety 11/22/2006  . GERD 11/22/2006  . IBS 11/22/2006  . Nonalcoholic steatohepatitis ([facility]) 11/22/2006    Current Outpatient Medications  Medication Sig Dispense Refill  . ALPRAZolam (XANAX) 1 MG tablet TAKE 1 TABLET BY MOUTH 3 TIMES DAILY AS NEEDED FOR ANXIETY 90 tablet 3  . aspirin EC 81 MG tablet Take 1 tablet (81 mg total) by mouth daily. 90 tablet 1  . Blood Glucose Monitoring Suppl (ACCU-CHEK GUIDE  ME) w/Device KIT 1 Act by Does not apply route 3 (three) times daily as needed. 1 kit 0  . buPROPion (WELLBUTRIN XL) 150 MG 24 hr tablet Take 150 mg by mouth daily.    . Continuous Blood Gluc Receiver (FREESTYLE LIBRE 14 DAY READER) DEVI 1 Act by Does not apply route daily. 6 each 5  . Continuous Blood Gluc Sensor (FREESTYLE LIBRE 14 DAY SENSOR) MISC 1 Act by Does not apply route daily. 6 each 5  . doxycycline (VIBRA-TABS) 100 MG tablet Take 1 tablet (100 mg total) by mouth 2 (two) times daily. 14 tablet 0  . ezetimibe (ZETIA) 10 MG tablet Take 1 tablet (10 mg total) by mouth daily. 90 tablet 1  . icosapent Ethyl (VASCEPA) 1 g capsule Take 2 capsules (2 g total) by mouth 2 (two) times daily. 360 capsule 1  . levothyroxine (SYNTHROID) 137 MCG tablet Take by mouth.    . mupirocin nasal ointment (BACTROBAN NASAL) 2 % Place 1 application into the nose 2 (two) times daily. Use one-half of tube in each nostril twice daily for five (5) days. After application, press sides of nose together and gently massage. 10 g 0  . mupirocin ointment (BACTROBAN) 2 % Apply 1 application topically 2 (two) times daily. To nose 30 g 0  . olopatadine (PATANOL) 0.1 % ophthalmic solution Place 1 drop into both eyes 2 (two) times daily. 5 mL 0  . omeprazole (PRILOSEC) 40 MG capsule Take 1 capsule (40 mg total) by mouth daily. TAKE 2 CAPSULES BY MOUTH ONCE A DAY 180 capsule 1  . pioglitazone (ACTOS) 15 MG tablet Take 1 tablet (15 mg total) by mouth daily. 90 tablet 1  .  potassium chloride SA (KLOR-CON) 20 MEQ tablet Take 1 tablet (20 mEq total) by mouth 2 (two) times daily. 180 each 0  . pravastatin (PRAVACHOL) 10 MG tablet Take 1 tablet by mouth daily.    . sertraline (ZOLOFT) 100 MG tablet Take 200 mg by mouth daily.     No current facility-administered medications for this visit.    Allergies: Farxiga [dapagliflozin], Metformin and related, Statins, Augmentin [amoxicillin-pot clavulanate], Crestor [rosuvastatin], Lipitor  [atorvastatin], Zocor [simvastatin], and Ozempic (0.25 or 0.5 mg-dose) [semaglutide(0.25 or 0.45m-dos)]  Past Medical History:  Diagnosis Date  . Allergy   . Anemia    past hx   . Anxiety state, unspecified   . Arthralgia of temporomandibular joint   . Arthritis   . Asthma    as a child , not now per pt   . Conversion disorder   . Dementia ([place]   . Depressive disorder, not elsewhere classified   . Diabetes mellitus without complication ([place]    off all medicines as of 04-16-2019 PV   . Dysphagia, unspecified(787.20)   . Endometriosis   . Esophageal reflux   . Fibromyalgia   . Headache(784.0)   . Heart murmur    past hx murmur   . Hx of adenomatous colonic polyps   . Irritable bowel syndrome   . Mitral valve disorders(424.0)   . Myalgia and myositis, unspecified   . Overweight(278.02)   . Periodic limb movement disorder   . Pure hypercholesterolemia   . RLS (restless legs syndrome)   . Seizures ([place]    last episode 5 yrs ago   . Stroke ([facility]    TIA per pt many years ago   . Unspecified hypothyroidism     Past Surgical History:  Procedure Laterality Date  . ABDOMINAL HYSTERECTOMY     TAH  . arm surgery    . COLONOSCOPY  04/14/2008  . FOOT SURGERY    . LIPOMA EXCISION     BESIDES BELLY BUTTON  . POLYPECTOMY     HPP x 1  . TONSILLECTOMY    . UPPER GASTROINTESTINAL ENDOSCOPY  2010   gessner   . WRIST SURGERY      Family History  Problem Relation Age of Onset  . Heart disease Mother   . Heart disease Father   . Diabetes Father   . Colon cancer Father 785      died age 5950from colon cancer spreading per pt   . Breast cancer Maternal Aunt   . Colon cancer Paternal Aunt   . Stomach cancer Paternal Aunt   . Lung cancer Paternal Aunt   . Rectal cancer Paternal Aunt   . Stomach cancer Paternal Uncle   . Colon polyps Neg Hx   . Esophageal cancer Neg Hx     Social History   Tobacco Use  . Smoking status: Current Some Day Smoker    Packs/day: 1.00    Types:  Cigarettes  . Smokeless tobacco: Never Used  . Tobacco comment: 1-2 per day  Substance Use Topics  . Alcohol use: Yes    Comment: rarely    Subjective:   Concerned about "sores" on inner side of both nostrils; symptoms present since mid-end January; was [facility] with [facility] complaints- treated for possible HSV and given Bactroban with improvement; Continues to feel that she has "blisters" down in her throat- has tried using Magic Mouthwash with no relief;   Objective:  Vitals:   04/05/20 1117  BP: 132/82  Pulse: 64  Temp: 98.3 F (36.8 C)  TempSrc: Oral  SpO2: 95%  Weight: 163 lb 9.6 oz (74.2 kg)  Height: _0  (1.702 m)    General: Well developed, well nourished, in no acute distress  Skin : Warm and dry.  Head: Normocephalic and atraumatic  Oropharynx: Pink, supple. No suspicious lesions  Neck: Supple without thyromegaly, adenopathy  Lungs: Respirations unlabored;  Neurologic: Alert and oriented; speech intact; face symmetrical; moves all extremities well; CNII-XII intact without focal deficit   Assessment:  1. Nasal sore   2. Sore throat     Plan:  Treat for suspect staph infection with Doxycycline and Bactroban; Unable to visualize any abnormality in her throat- she is comfortable with referral to ENT; no relief with Magic Mouthwash;  She has multiple questions and concerns about her medications. She is encouraged to bring her bottles to next appointment with her PCP next week and they can do medication review together.   This visit occurred during the SARS-CoV-2 public health emergency.  Safety protocols were in place, including screening questions prior to the visit, additional usage of staff PPE, and extensive cleaning of exam room while observing appropriate contact time as indicated for disinfecting solutions.     No follow-ups on file.  Orders Placed This Encounter  Procedures  . Ambulatory referral to ENT    Referral Priority:   Routine    Referral  Type:   Consultation    Referral Reason:   Specialty Services Required    Requested Specialty:   Otolaryngology    Number of Visits Requested:   1    Requested Prescriptions   Signed Prescriptions Disp Refills  . doxycycline (VIBRA-TABS) 100 MG tablet 14 tablet 0    Sig: Take 1 tablet (100 mg total) by mouth 2 (two) times daily.  . mupirocin nasal ointment (BACTROBAN NASAL) 2 % 10 g 0    Sig: Place 1 application into the nose 2 (two) times daily. Use one-half of tube in each nostril twice daily for five (5) days. After application, press sides of nose together and gently massage.

## 2020-04-13 ENCOUNTER — Telehealth: Payer: Self-pay | Admitting: Internal Medicine

## 2020-04-13 ENCOUNTER — Other Ambulatory Visit: Payer: Self-pay | Admitting: Internal Medicine

## 2020-04-13 DIAGNOSIS — E876 Hypokalemia: Secondary | ICD-10-CM

## 2020-04-13 MED ORDER — POTASSIUM CHLORIDE CRYS ER 20 MEQ PO TBCR: 20.0000 meq | EXTENDED_RELEASE_TABLET | Freq: Two times a day (BID) | ORAL | 0 refills | Status: DC

## 2020-04-13 MED FILL — PIOGLITAZONE HCL 15 MG TABS: 15 | 90 days supply | Qty: 90 | Fill #1

## 2020-04-13 MED FILL — POTASSIUM CHLORIDE CRYS ER: 20 | 90 days supply | Qty: 180 | Fill #0

## 2020-04-13 NOTE — Telephone Encounter (Signed)
Patient requesting refill for  potassium chloride SA (KLOR-CON) 20 MEQ tablet  She states she has no medication remaining  Pharmacy [place], [place]

## 2020-04-14 ENCOUNTER — Ambulatory Visit: Payer: Medicare Other | Admitting: Internal Medicine

## 2020-04-20 ENCOUNTER — Other Ambulatory Visit: Payer: Self-pay

## 2020-04-20 NOTE — Patient Outreach (Signed)
[facility]) Care Management  04/20/2020  Tasha [name] February 13, 1960 [id number]   Telephone call to patient for disease management follow up.   No answer.  HIPAA compliant voice message left.    Plan: If no return call, RN CM will attempt patient again in the month of June.  Jone Baseman, RN, MSN [place] Management Care Management Coordinator Direct Line [phone] Cell [phone] Toll Free: [phone]  Fax: [phone]

## 2020-04-27 ENCOUNTER — Encounter (INDEPENDENT_AMBULATORY_CARE_PROVIDER_SITE_OTHER): Payer: Self-pay | Admitting: Otolaryngology

## 2020-04-27 ENCOUNTER — Ambulatory Visit (INDEPENDENT_AMBULATORY_CARE_PROVIDER_SITE_OTHER): Payer: Medicare Other | Admitting: Otolaryngology

## 2020-04-27 ENCOUNTER — Other Ambulatory Visit: Payer: Self-pay

## 2020-04-27 VITALS — Temp 97.3°F

## 2020-04-27 DIAGNOSIS — J3489 Other specified disorders of nose and nasal sinuses: Secondary | ICD-10-CM | POA: Diagnosis not present

## 2020-04-27 NOTE — Progress Notes (Signed)
HPI: Tasha [name] is a 60 y.o. female who presents is referred by by her PCP for evaluation of sores that she developed in her nose as well as sores and ulcers that she developed in her mouth.  She also inquires about some knots in her neck.  Her sore throat is actually doing much better today as are the sores in her nose.Marland Kitchen Apparently when she had a sore throat she was treated with clindamycin but this is doing better now.  Past Medical History:  Diagnosis Date  . Allergy   . Anemia    past hx   . Anxiety state, unspecified   . Arthralgia of temporomandibular joint   . Arthritis   . Asthma    as a child , not now per pt   . Conversion disorder   . Dementia ([place])   . Depressive disorder, not elsewhere classified   . Diabetes mellitus without complication ([place])    off all medicines as of 04-16-2019 PV   . Dysphagia, unspecified(787.20)   . Endometriosis   . Esophageal reflux   . Fibromyalgia   . Headache(784.0)   . Heart murmur    past hx murmur   . Hx of adenomatous colonic polyps   . Irritable bowel syndrome   . Mitral valve disorders(424.0)   . Myalgia and myositis, unspecified   . Overweight(278.02)   . Periodic limb movement disorder   . Pure hypercholesterolemia   . RLS (restless legs syndrome)   . Seizures ([place])    last episode 5 yrs ago   . Stroke [facility])    TIA per pt many years ago   . Unspecified hypothyroidism    Past Surgical History:  Procedure Laterality Date  . ABDOMINAL HYSTERECTOMY     TAH  . arm surgery    . COLONOSCOPY  04/14/2008  . FOOT SURGERY    . LIPOMA EXCISION     BESIDES BELLY BUTTON  . POLYPECTOMY     HPP x 1  . TONSILLECTOMY    . UPPER GASTROINTESTINAL ENDOSCOPY  2010   [facility]ner   . WRIST SURGERY     Social History   Socioeconomic History  . Marital status: Married    Spouse name: Water quality scientist [place]  . Number of children: 2  . Years of education: 93  . Highest education level: Not on file  Occupational History  .  Occupation: Disabled    Employer: [employer]  Tobacco Use  . Smoking status: Current Some Day Smoker    Packs/day: 1.00    Years: 40.00    Pack years: 40.00    Types: Cigarettes  . Smokeless tobacco: Never Used  . Tobacco comment: 1-2 per day  Vaping Use  . Vaping Use: Some days  . Substances: Nicotine, Flavoring  Substance and Sexual Activity  . Alcohol use: Yes    Comment: rarely  . Drug use: No  . Sexual activity: Yes  Other Topics Concern  . Not on file  Social History Narrative   Lives w/ husband Oneida Arenas [place]   Caffeine use: 2 cups coffee per day   Tea-3-4 drinks   Right handed    Social Determinants of Health   Financial Resource Strain: Low Risk   . Difficulty of Paying Living Expenses: Not hard at all  Food Insecurity: No Food Insecurity  . Worried About Charity fundraiser in the Last Year: Never true  . Ran Out of Food in the Last Year: Never true  Transportation Needs: No Transportation Needs  . Lack of Transportation (Medical): No  . Lack of Transportation (Non-Medical): No  Physical Activity: Not on file  Stress: Stress Concern Present  . Feeling of Stress : Very much  Social Connections: Moderately Isolated  . Frequency of Communication with Friends and Family: More than three times a week  . Frequency of Social Gatherings with Friends and Family: More than three times a week  . Attends Religious Services: Never  . Active Member of Clubs or Organizations: No  . Attends Archivist Meetings: Never  . Marital Status: Married   Family History  Problem Relation Age of Onset  . Heart disease Mother   . Heart disease Father   . Diabetes Father   . Colon cancer Father 53       died age 56 from colon cancer spreading per pt   . Breast cancer Maternal Aunt   . Colon cancer Paternal Aunt   . Stomach cancer Paternal Aunt   . Lung cancer Paternal Aunt   . Rectal cancer Paternal Aunt   . Stomach cancer Paternal Uncle   . Colon polyps Neg  Hx   . Esophageal cancer Neg Hx    Allergies  Allergen Reactions  . Wilder [place]ade [Dapagliflozin] Other (See Comments)    Vag yeast inf  . Metformin And Related Diarrhea  . Statins Other (See Comments)    Muscle weakness/pains  . Augmentin [Amoxicillin-Pot Clavulanate] Other (See Comments)    Patient states she recently reported to dentist and was advised to report to providers as allergy.  States she got blisters under her eyes after taking Augmentin.  . Crestor [Rosuvastatin] Other (See Comments)    Muscle aches  . Lipitor [Atorvastatin] Other (See Comments)    Muscle aches  . Zocor [Simvastatin] Other (See Comments)    Leg pain per patient/PE  . Ozempic (0.25 Or 0.5 Mg-Dose) [Semaglutide(0.25 Or 0.5mg -Dos)] Nausea And Vomiting   Prior to Admission medications   Medication Sig Start Date End Date Taking? Authorizing Provider  ALPRAZolam Duanne Moron) 1 MG tablet TAKE 1 TABLET BY MOUTH 3 TIMES DAILY AS NEEDED FOR ANXIETY 01/20/20   Janith Lima, MD  aspirin EC 81 MG tablet Take 1 tablet (81 mg total) by mouth daily. 07/26/18   Janith Lima, MD  Blood Glucose Monitoring Suppl (ACCU-CHEK GUIDE ME) w/Device KIT 1 Act by Does not apply route 3 (three) times daily as needed. 08/12/19   Janith Lima, MD  buPROPion (WELLBUTRIN XL) 150 MG 24 hr tablet Take 150 mg by mouth daily. 03/05/20   [provider]  Continuous Blood Gluc Receiver (FREESTYLE LIBRE 14 DAY READER) DEVI 1 Act by Does not apply route daily. 08/12/19   Janith Lima, MD  Continuous Blood Gluc Sensor (FREESTYLE LIBRE 14 DAY SENSOR) MISC 1 Act by Does not apply route daily. 10/08/19   Janith Lima, MD  ezetimibe (ZETIA) 10 MG tablet Take 1 tablet (10 mg total) by mouth daily. 10/10/19   Janith Lima, MD  icosapent Ethyl (VASCEPA) 1 g capsule Take 2 capsules (2 g total) by mouth 2 (two) times daily. 10/27/19   Janith Lima, MD  levothyroxine (SYNTHROID) 137 MCG tablet Take by mouth. 02/20/20   [provider]   mupirocin nasal ointment (BACTROBAN NASAL) 2 % Place 1 application into the nose 2 (two) times daily. Use one-half of tube in each nostril twice daily for five (5) days. After application, press sides of  nose together and gently massage. 04/05/20   Marrian Salvage, FNP  olopatadine (PATANOL) 0.1 % ophthalmic solution Place 1 drop into both eyes 2 (two) times daily. 02/27/20   Wieters, Hallie C, PA-C  omeprazole (PRILOSEC) 40 MG capsule Take 1 capsule (40 mg total) by mouth daily. TAKE 2 CAPSULES BY MOUTH ONCE A DAY 10/27/19   Janith Lima, MD  pioglitazone (ACTOS) 15 MG tablet Take 1 tablet (15 mg total) by mouth daily. 10/27/19   Janith Lima, MD  potassium chloride SA (KLOR-CON) 20 MEQ tablet Take 1 tablet (20 mEq total) by mouth 2 (two) times daily. 04/13/20   Janith Lima, MD  pravastatin (PRAVACHOL) 10 MG tablet Take 1 tablet by mouth daily. 11/18/19   [provider]  sertraline (ZOLOFT) 100 MG tablet Take 200 mg by mouth daily. 03/05/20   [provider]  ARIPiprazole (ABILIFY) 2 MG tablet Take 1 tablet (2 mg total) by mouth daily. 10/10/19 02/27/20  Janith Lima, MD  divalproex (DEPAKOTE) 500 MG DR tablet Take 1 tablet (500 mg total) by mouth 2 (two) times daily. 10/10/19 02/27/20  Janith Lima, MD  omega-3 acid ethyl esters (LOVAZA) 1 g capsule Take 2 capsules (2 g total) by mouth 2 (two) times daily. 04/18/19 10/10/19  Janith Lima, MD     Positive ROS: Otherwise negative  All other systems have been reviewed and were otherwise negative with the exception of those mentioned in the HPI and as above.  Physical Exam: Constitutional: Alert, well-appearing, no acute distress Ears: External ears without lesions or tenderness. Ear canals are clear bilaterally with intact, clear TMs.  Nasal: External nose without lesions. Septum is midline.. Clear nasal passages bilaterally.  She points to the areas of the sores occurring anteriorly at the junction of the skin and  mucous membranes in the nostril usually along the floor of medial aspect of the nostril.  There are no sores today and no tenderness..  The nasal cavity is otherwise clear in both middle meatus regions are clear with no signs of infection. Oral: Lips and gums without lesions. Tongue and palate mucosa without lesions. Posterior oropharynx clear.  Patient is status post tonsillectomy.  Oral mucosal membranes are all clear with no sores or ulcers noted.  Indirect laryngoscopy revealed a clear base of tongue and hypopharynx. Neck: No significant adenopathy noted on palpation of either side of the neck.  The "knots" that she feels in her neck represent the submandibular glands..  They are normal to palpation with no tenderness or swelling. Respiratory: Breathing comfortably  Skin: No facial/neck lesions or rash noted.  Procedures  Assessment: History of nasal vestibulitis but no source presently. Clear upper airway examination otherwise with no evidence of active infection or neoplasm.  Plan: Discussed with her that if she develops any sores in her nose would recommend use of mupirocin 2% ointment twice a day for 5 to 6 days.  This should help resolve the source.  There is no evidence of neoplasm or active infection. She will follow-up as needed.   Radene Journey, MD   CC:

## 2020-04-29 ENCOUNTER — Other Ambulatory Visit: Payer: Self-pay | Admitting: Internal Medicine

## 2020-04-29 ENCOUNTER — Encounter: Payer: Self-pay | Admitting: Internal Medicine

## 2020-04-29 ENCOUNTER — Ambulatory Visit (INDEPENDENT_AMBULATORY_CARE_PROVIDER_SITE_OTHER): Payer: Medicare Other | Admitting: Internal Medicine

## 2020-04-29 ENCOUNTER — Telehealth: Payer: Self-pay | Admitting: Internal Medicine

## 2020-04-29 ENCOUNTER — Other Ambulatory Visit: Payer: Self-pay

## 2020-04-29 VITALS — BP 122/76 | HR 64 | Temp 98.3°F | Ht 67.0 in | Wt 164.0 lb

## 2020-04-29 DIAGNOSIS — Z0000 Encounter for general adult medical examination without abnormal findings: Secondary | ICD-10-CM

## 2020-04-29 DIAGNOSIS — Z1231 Encounter for screening mammogram for malignant neoplasm of breast: Secondary | ICD-10-CM

## 2020-04-29 DIAGNOSIS — E039 Hypothyroidism, unspecified: Secondary | ICD-10-CM | POA: Diagnosis not present

## 2020-04-29 DIAGNOSIS — K219 Gastro-esophageal reflux disease without esophagitis: Secondary | ICD-10-CM

## 2020-04-29 DIAGNOSIS — E118 Type 2 diabetes mellitus with unspecified complications: Secondary | ICD-10-CM

## 2020-04-29 DIAGNOSIS — E876 Hypokalemia: Secondary | ICD-10-CM | POA: Diagnosis not present

## 2020-04-29 DIAGNOSIS — Z0001 Encounter for general adult medical examination with abnormal findings: Secondary | ICD-10-CM

## 2020-04-29 DIAGNOSIS — D696 Thrombocytopenia, unspecified: Secondary | ICD-10-CM

## 2020-04-29 DIAGNOSIS — E781 Pure hyperglyceridemia: Secondary | ICD-10-CM

## 2020-04-29 DIAGNOSIS — F418 Other specified anxiety disorders: Secondary | ICD-10-CM

## 2020-04-29 DIAGNOSIS — K7581 Nonalcoholic steatohepatitis (NASH): Secondary | ICD-10-CM

## 2020-04-29 DIAGNOSIS — E785 Hyperlipidemia, unspecified: Secondary | ICD-10-CM

## 2020-04-29 LAB — CBC WITH DIFFERENTIAL/PLATELET
Basophils Absolute: 0 10*3/uL (ref 0.0–0.1)
Basophils Relative: 0.4 % (ref 0.0–3.0)
Eosinophils Absolute: 0 10*3/uL (ref 0.0–0.7)
Eosinophils Relative: 0 % (ref 0.0–5.0)
HCT: 42.6 % (ref 36.0–46.0)
Hemoglobin: 14.2 g/dL (ref 12.0–15.0)
Lymphocytes Relative: 44.2 % (ref 12.0–46.0)
Lymphs Abs: 2.9 10*3/uL (ref 0.7–4.0)
MCHC: 33.3 g/dL (ref 30.0–36.0)
MCV: 94 fl (ref 78.0–100.0)
Monocytes Absolute: 0.4 10*3/uL (ref 0.1–1.0)
Monocytes Relative: 6.1 % (ref 3.0–12.0)
Neutro Abs: 3.2 10*3/uL (ref 1.4–7.7)
Neutrophils Relative %: 49.3 % (ref 43.0–77.0)
Platelets: 133 10*3/uL — ABNORMAL LOW (ref 150.0–400.0)
RBC: 4.53 Mil/uL (ref 3.87–5.11)
RDW: 13.6 % (ref 11.5–15.5)
WBC: 6.5 10*3/uL (ref 4.0–10.5)

## 2020-04-29 LAB — FOLATE: Folate: 22.2 ng/mL (ref >5.9)

## 2020-04-29 LAB — MAGNESIUM: Magnesium: 1.8 mg/dL (ref 1.5–2.5)

## 2020-04-29 LAB — BASIC METABOLIC PANEL
BUN: 9 mg/dL (ref 6–23)
CO2: 30 mEq/L (ref 19–32)
Calcium: 9.4 mg/dL (ref 8.4–10.5)
Chloride: 103 mEq/L (ref 96–112)
Creatinine, Ser: 0.81 mg/dL (ref 0.40–1.20)
GFR: 78.53 mL/min (ref >60.00)
Glucose, Bld: 123 mg/dL — ABNORMAL HIGH (ref 70–99)
Potassium: 4.4 mEq/L (ref 3.5–5.1)
Sodium: 140 mEq/L (ref 135–145)

## 2020-04-29 LAB — VITAMIN B12: Vitamin B-12: 327 pg/mL (ref 211–911)

## 2020-04-29 LAB — HEMOGLOBIN A1C: Hgb A1c MFr Bld: 7.1 % — ABNORMAL HIGH (ref 4.6–6.5)

## 2020-04-29 LAB — TSH: TSH: 0.2 u[IU]/mL — ABNORMAL LOW (ref 0.35–4.50)

## 2020-04-29 MED ORDER — ICOSAPENT ETHYL 1 G PO CAPS: 2.0000 g | ORAL_CAPSULE | Freq: Two times a day (BID) | ORAL | 1 refills | Status: DC

## 2020-04-29 MED ORDER — ALPRAZOLAM 1 MG PO TABS: 1.0000 mg | ORAL_TABLET | Freq: Three times a day (TID) | ORAL | 5 refills | Status: DC | PRN

## 2020-04-29 MED ORDER — EZETIMIBE 10 MG PO TABS: 10.0000 mg | ORAL_TABLET | Freq: Every day | ORAL | 1 refills | Status: DC

## 2020-04-29 MED ORDER — PIOGLITAZONE HCL 15 MG PO TABS: 15.0000 mg | ORAL_TABLET | Freq: Every day | ORAL | 1 refills | Status: DC

## 2020-04-29 MED ORDER — OMEPRAZOLE 40 MG PO CPDR: 40.0000 mg | DELAYED_RELEASE_CAPSULE | Freq: Every day | ORAL | 1 refills | Status: DC

## 2020-04-29 MED ORDER — SYNTHROID 112 MCG PO TABS: 112.0000 ug | ORAL_TABLET | Freq: Every day | ORAL | 0 refills | Status: DC

## 2020-04-29 MED ORDER — POTASSIUM CHLORIDE CRYS ER 20 MEQ PO TBCR: 20.0000 meq | EXTENDED_RELEASE_TABLET | Freq: Two times a day (BID) | ORAL | 0 refills | Status: DC

## 2020-04-29 MED ORDER — PRAVASTATIN SODIUM 10 MG PO TABS: 10.0000 mg | ORAL_TABLET | Freq: Every day | ORAL | 1 refills | Status: DC

## 2020-04-29 MED ORDER — SERTRALINE HCL 100 MG PO TABS: 200.0000 mg | ORAL_TABLET | Freq: Every day | ORAL | 1 refills | Status: DC

## 2020-04-29 MED FILL — EZETIMIBE 10 MG TABS: 10 | 90 days supply | Qty: 90 | Fill #0

## 2020-04-29 NOTE — Telephone Encounter (Signed)
I looked at her BMI after we talked

## 2020-04-29 NOTE — Progress Notes (Signed)
Subjective:  Patient ID: Tasha [name], female    DOB: 1960-11-11  Age: 60 y.o. MRN: [id number]  CC: Hypothyroidism, Annual Exam, Depression, and Hyperlipidemia  This visit occurred during the SARS-CoV-2 public health emergency.  Safety protocols were in place, including screening questions prior to the visit, additional usage of staff PPE, and extensive cleaning of exam room while observing appropriate contact time as indicated for disinfecting solutions.    HPI Tasha [name] presents for a CPX.  She continues to have a positive review of systems.  She complains of chronic blurred vision but was told by her eye doctor 3 months ago that her eyes are okay.  She uses reading glasses.  She complains of chronic constipation and uses laxatives and stool softeners.  She complains of fatigue and weight gain.  She is active and denies any recent episodes of chest pain, shortness of breath, palpitations, edema, or polys.  Outpatient Medications Prior to Visit  Medication Sig Dispense Refill  . aspirin EC 81 MG tablet Take 1 tablet (81 mg total) by mouth daily. 90 tablet 1  . Blood Glucose Monitoring Suppl (ACCU-CHEK GUIDE ME) w/Device KIT 1 Act by Does not apply route 3 (three) times daily as needed. 1 kit 0  . buPROPion (WELLBUTRIN XL) 150 MG 24 hr tablet Take 150 mg by mouth daily.    . Continuous Blood Gluc Receiver (FREESTYLE LIBRE 14 DAY READER) DEVI 1 Act by Does not apply route daily. 6 each 5  . Continuous Blood Gluc Sensor (FREESTYLE LIBRE 14 DAY SENSOR) MISC 1 Act by Does not apply route daily. 6 each 5  . mupirocin nasal ointment (BACTROBAN NASAL) 2 % Place 1 application into the nose 2 (two) times daily. Use one-half of tube in each nostril twice daily for five (5) days. After application, press sides of nose together and gently massage. 10 g 0  . olopatadine (PATANOL) 0.1 % ophthalmic solution Place 1 drop into both eyes 2 (two) times daily. 5 mL 0  . ALPRAZolam (XANAX) 1 MG  tablet TAKE 1 TABLET BY MOUTH 3 TIMES DAILY AS NEEDED FOR ANXIETY 90 tablet 3  . ezetimibe (ZETIA) 10 MG tablet Take 1 tablet (10 mg total) by mouth daily. 90 tablet 1  . icosapent Ethyl (VASCEPA) 1 g capsule Take 2 capsules (2 g total) by mouth 2 (two) times daily. 360 capsule 1  . levothyroxine (SYNTHROID) 137 MCG tablet Take by mouth.    Marland Kitchen omeprazole (PRILOSEC) 40 MG capsule Take 1 capsule (40 mg total) by mouth daily. TAKE 2 CAPSULES BY MOUTH ONCE A DAY 180 capsule 1  . pioglitazone (ACTOS) 15 MG tablet Take 1 tablet (15 mg total) by mouth daily. 90 tablet 1  . potassium chloride SA (KLOR-CON) 20 MEQ tablet Take 1 tablet (20 mEq total) by mouth 2 (two) times daily. 180 tablet 0  . pravastatin (PRAVACHOL) 10 MG tablet Take 1 tablet by mouth daily.    . sertraline (ZOLOFT) 100 MG tablet Take 200 mg by mouth daily.     No facility-administered medications prior to visit.    ROS Review of Systems  Constitutional: Positive for fatigue and unexpected weight change. Negative for appetite change, chills, diaphoresis and fever.  HENT: Negative.  Negative for trouble swallowing and voice change.   Eyes: Positive for visual disturbance.  Respiratory: Negative for cough, chest tightness, shortness of breath and wheezing.   Cardiovascular: Negative for chest pain, palpitations and leg swelling.  Gastrointestinal: Negative for  abdominal pain, constipation, diarrhea, nausea and vomiting.  Genitourinary: Negative.  Negative for difficulty urinating, dysuria and hematuria.  Musculoskeletal: Positive for arthralgias. Negative for back pain and myalgias.  Skin: Negative.  Negative for color change and pallor.  Neurological: Negative.  Negative for dizziness, weakness and light-headedness.  Hematological: Negative for adenopathy. Does not bruise/bleed easily.  Psychiatric/Behavioral: Positive for dysphoric mood. Negative for behavioral problems and sleep disturbance. The patient is nervous/anxious.      Objective:  BP 122/76   Pulse 64   Temp 98.3 F (36.8 C) (Oral)   Ht 5' 7"  (1.702 m)   Wt 164 lb (74.4 kg)   SpO2 97%   BMI 25.69 kg/m   BP Readings from Last 3 Encounters:  04/29/20 122/76  04/05/20 132/82  03/25/20 120/80    Wt Readings from Last 3 Encounters:  04/29/20 164 lb (74.4 kg)  04/05/20 163 lb 9.6 oz (74.2 kg)  03/25/20 159 lb 6.4 oz (72.3 kg)    Physical Exam Vitals reviewed.  Constitutional:      Appearance: Normal appearance.  HENT:     Nose: Nose normal.     Mouth/Throat:     Mouth: Mucous membranes are moist.  Eyes:     General: No scleral icterus.    Conjunctiva/sclera: Conjunctivae normal.  Cardiovascular:     Rate and Rhythm: Normal rate and regular rhythm.     Heart sounds: No murmur heard.   Pulmonary:     Effort: Pulmonary effort is normal.     Breath sounds: No stridor. No wheezing, rhonchi or rales.  Abdominal:     General: Abdomen is flat.     Palpations: There is no mass.     Tenderness: There is no abdominal tenderness. There is no guarding.  Musculoskeletal:        General: Normal range of motion.     Cervical back: Neck supple.     Right lower leg: No edema.     Left lower leg: No edema.  Lymphadenopathy:     Cervical: No cervical adenopathy.  Skin:    General: Skin is warm and dry.     Coloration: Skin is not jaundiced or pale.     Findings: No bruising or rash.  Neurological:     General: No focal deficit present.     Mental Status: She is alert and oriented to person, place, and time. Mental status is at baseline.  Psychiatric:        Mood and Affect: Mood normal.        Behavior: Behavior normal.        Thought Content: Thought content normal.        Judgment: Judgment normal.     Lab Results  Component Value Date   WBC 6.5 04/29/2020   HGB 14.2 04/29/2020   HCT 42.6 04/29/2020   PLT 133.0 (L) 04/29/2020   GLUCOSE 123 (H) 04/29/2020   CHOL 165 12/23/2019   TRIG 113.0 12/23/2019   HDL 35.20 (L)  12/23/2019   LDLDIRECT 131.0 01/27/2019   LDLCALC 107 (H) 12/23/2019   ALT 19 12/20/2019   AST 22 12/20/2019   NA 140 04/29/2020   K 4.4 04/29/2020   CL 103 04/29/2020   CREATININE 0.81 04/29/2020   BUN 9 04/29/2020   CO2 30 04/29/2020   TSH 0.20 (L) 04/29/2020   INR 1.0 01/27/2019   HGBA1C 7.1 (H) 04/29/2020   MICROALBUR <0.7 01/27/2019    No results found.  Assessment & Plan:  Tasha [name] was seen today for hypothyroidism, annual exam, depression and hyperlipidemia.  Diagnoses and all orders for this visit:  Acquired hypothyroidism- Her TSH is suppressed.  I recommended that she decrease her dose of levothyroxine. -     TSH; Future -     TSH -     SYNTHROID 112 MCG tablet; Take 1 tablet (112 mcg total) by mouth daily before breakfast.  Thrombocytopenia ([place])- Her platelet count is stable.  Secondary work-up is unremarkable.  This is likely ITP. -     CBC with Differential/Platelet; Future -     Vitamin B12; Future -     Folate; Future -     Folate -     Vitamin B12 -     CBC with Differential/Platelet  Chronic hypokalemia- Her potassium level is normal now. -     Magnesium; Future -     Basic metabolic panel; Future -     Basic metabolic panel -     Magnesium  Depression with anxiety-she wants to stay at the current doses of Xanax and sertraline. -     ALPRAZolam (XANAX) 1 MG tablet; Take 1 tablet (1 mg total) by mouth 3 (three) times daily as needed for anxiety. -     sertraline (ZOLOFT) 100 MG tablet; Take 2 tablets (200 mg total) by mouth daily.  Hyperlipidemia with target LDL less than 130- She has achieved her LDL goal and is doing well on the statin. -     ezetimibe (ZETIA) 10 MG tablet; Take 1 tablet (10 mg total) by mouth daily. -     pravastatin (PRAVACHOL) 10 MG tablet; Take 1 tablet (10 mg total) by mouth daily.  Hyperglyceridemia, pure -     icosapent Ethyl (VASCEPA) 1 g capsule; Take 2 capsules (2 g total) by mouth 2 (two) times daily. -      Discontinue: pravastatin (PRAVACHOL) 10 MG tablet; Take 1 tablet (10 mg total) by mouth daily.  Gastroesophageal reflux disease without esophagitis- Her symptoms are well controlled. -     omeprazole (PRILOSEC) 40 MG capsule; Take 1 capsule (40 mg total) by mouth daily. TAKE 2 CAPSULES BY MOUTH ONCE A DAY  Type II diabetes mellitus with manifestations ([place])- Her A1c is up to 7.1%.  I recommended that she start taking a GLP-1 agonist. -     pioglitazone (ACTOS) 15 MG tablet; Take 1 tablet (15 mg total) by mouth daily. -     Hemoglobin A1c; Future -     Hemoglobin A1c -     HM Diabetes Foot Exam -     Semaglutide (RYBELSUS) 3 MG TABS; Take 1 tablet by mouth daily.  Nonalcoholic steatohepatitis (NA[facility])- Her LFTs are normal now.  Will continue pioglitazone. -     pioglitazone (ACTOS) 15 MG tablet; Take 1 tablet (15 mg total) by mouth daily.  Acute hypokalemia -     potassium chloride SA (KLOR-CON) 20 MEQ tablet; Take 1 tablet (20 mEq total) by mouth 2 (two) times daily.  Visit for screening mammogram -     MM DIGITAL SCREENING BILATERAL; Future  Encounter for general adult medical examination with abnormal findings   I have discontinued Tasha [name] "Kim"'s levothyroxine. I have also changed her ALPRAZolam and sertraline. Additionally, I am having her start on Synthroid and Rybelsus. Lastly, I am having her maintain her aspirin EC, FreeStyle Libre 14 Day Reader, Accu-Chek Guide Me, FreeStyle Libre 14 Day Sensor, olopatadine, buPROPion, Bactroban Nasal, ezetimibe, icosapent Ethyl, omeprazole,  pioglitazone, potassium chloride SA, and pravastatin.  Meds ordered this encounter  Medications  . ALPRAZolam (XANAX) 1 MG tablet    Sig: Take 1 tablet (1 mg total) by mouth 3 (three) times daily as needed for anxiety.    Dispense:  90 tablet    Refill:  5  . ezetimibe (ZETIA) 10 MG tablet    Sig: Take 1 tablet (10 mg total) by mouth daily.    Dispense:  90 tablet    Refill:  1  . icosapent  Ethyl (VASCEPA) 1 g capsule    Sig: Take 2 capsules (2 g total) by mouth 2 (two) times daily.    Dispense:  360 capsule    Refill:  1  . omeprazole (PRILOSEC) 40 MG capsule    Sig: Take 1 capsule (40 mg total) by mouth daily. TAKE 2 CAPSULES BY MOUTH ONCE A DAY    Dispense:  180 capsule    Refill:  1  . pioglitazone (ACTOS) 15 MG tablet    Sig: Take 1 tablet (15 mg total) by mouth daily.    Dispense:  90 tablet    Refill:  1  . potassium chloride SA (KLOR-CON) 20 MEQ tablet    Sig: Take 1 tablet (20 mEq total) by mouth 2 (two) times daily.    Dispense:  180 tablet    Refill:  0  . DISCONTD: pravastatin (PRAVACHOL) 10 MG tablet    Sig: Take 1 tablet (10 mg total) by mouth daily.    Dispense:  90 tablet    Refill:  1  . sertraline (ZOLOFT) 100 MG tablet    Sig: Take 2 tablets (200 mg total) by mouth daily.    Dispense:  90 tablet    Refill:  1  . SYNTHROID 112 MCG tablet    Sig: Take 1 tablet (112 mcg total) by mouth daily before breakfast.    Dispense:  90 tablet    Refill:  0  . pravastatin (PRAVACHOL) 10 MG tablet    Sig: Take 1 tablet (10 mg total) by mouth daily.    Dispense:  90 tablet    Refill:  1  . Semaglutide (RYBELSUS) 3 MG TABS    Sig: Take 1 tablet by mouth daily.    Dispense:  30 tablet    Refill:  0   In addition to time spent on CPE, I spent 45 minutes in preparing to see the patient by review of recent labs, obtaining and reviewing separately obtained history, communicating with the patient, ordering medications, tests or procedures, and documenting clinical information in the EHR including the differential Dx, treatment, and any further evaluation and other management of 1. Acquired hypothyroidism 2. Thrombocytopenia ([place]) 3. Chronic hypokalemia 4. Depression with anxiety 5. Hyperlipidemia with target LDL less than 130 6. Hyperglyceridemia, pure 7. Gastroesophageal reflux disease without esophagitis 8. Type II diabetes mellitus with manifestations  ([place]) 9. Nonalcoholic steatohepatitis ([facility])   Follow-up: Return in about 6 months (around 10/30/2020).  [name], MD

## 2020-04-29 NOTE — Telephone Encounter (Signed)
error 

## 2020-04-29 NOTE — Telephone Encounter (Signed)
Called pt, LVM.   

## 2020-04-29 NOTE — Telephone Encounter (Signed)
Patient states when she was here today Dr. Ronnald Ramp mentioned sending in a diet pill and she went to the pharmacy and they haven't received it.

## 2020-04-29 NOTE — Telephone Encounter (Signed)
Her BMI is too low for a diet pill

## 2020-04-29 NOTE — Patient Instructions (Signed)
Health Maintenance, Female Adopting a healthy lifestyle and getting preventive care are important in promoting health and wellness. Ask your health care provider about:  The right schedule for you to have regular tests and exams.  Things you can do on your own to prevent diseases and keep yourself healthy. What should I know about diet, weight, and exercise? Eat a healthy diet  Eat a diet that includes plenty of vegetables, fruits, low-fat dairy products, and lean protein.  Do not eat a lot of foods that are high in solid fats, added sugars, or sodium.   Maintain a healthy weight Body mass index (BMI) is used to identify weight problems. It estimates body fat based on height and weight. Your health care provider can help determine your BMI and help you achieve or maintain a healthy weight. Get regular exercise Get regular exercise. This is one of the most important things you can do for your health. Most adults should:  Exercise for at least 150 minutes each week. The exercise should increase your heart rate and make you sweat (moderate-intensity exercise).  Do strengthening exercises at least twice a week. This is in addition to the moderate-intensity exercise.  Spend less time sitting. Even light physical activity can be beneficial. Watch cholesterol and blood lipids Have your blood tested for lipids and cholesterol at 61 years of age, then have this test every 5 years. Have your cholesterol levels checked more often if:  Your lipid or cholesterol levels are high.  You are older than 61 years of age.  You are at high risk for heart disease. What should I know about cancer screening? Depending on your health history and family history, you may need to have cancer screening at various ages. This may include screening for:  Breast cancer.  Cervical cancer.  Colorectal cancer.  Skin cancer.  Lung cancer. What should I know about heart disease, diabetes, and high blood  pressure? Blood pressure and heart disease  High blood pressure causes heart disease and increases the risk of stroke. This is more likely to develop in people who have high blood pressure readings, are of African descent, or are overweight.  Have your blood pressure checked: ? Every 3-5 years if you are 18-39 years of age. ? Every year if you are 40 years old or older. Diabetes Have regular diabetes screenings. This checks your fasting blood sugar level. Have the screening done:  Once every three years after age 40 if you are at a normal weight and have a low risk for diabetes.  More often and at a younger age if you are overweight or have a high risk for diabetes. What should I know about preventing infection? Hepatitis B If you have a higher risk for hepatitis B, you should be screened for this virus. Talk with your health care provider to find out if you are at risk for hepatitis B infection. Hepatitis C Testing is recommended for:  Everyone born from 1945 through 1965.  Anyone with known risk factors for hepatitis C. Sexually transmitted infections (STIs)  Get screened for STIs, including gonorrhea and chlamydia, if: ? You are sexually active and are younger than 61 years of age. ? You are older than 61 years of age and your health care provider tells you that you are at risk for this type of infection. ? Your sexual activity has changed since you were last screened, and you are at increased risk for chlamydia or gonorrhea. Ask your health care provider   if you are at risk.  Ask your health care provider about whether you are at high risk for HIV. Your health care provider may recommend a prescription medicine to help prevent HIV infection. If you choose to take medicine to prevent HIV, you should first get tested for HIV. You should then be tested every 3 months for as long as you are taking the medicine. Pregnancy  If you are about to stop having your period (premenopausal) and  you may become pregnant, seek counseling before you get pregnant.  Take 400 to 800 micrograms (mcg) of folic acid every day if you become pregnant.  Ask for birth control (contraception) if you want to prevent pregnancy. Osteoporosis and menopause Osteoporosis is a disease in which the bones lose minerals and strength with aging. This can result in bone fractures. If you are 65 years old or older, or if you are at risk for osteoporosis and fractures, ask your health care provider if you should:  Be screened for bone loss.  Take a calcium or vitamin D supplement to lower your risk of fractures.  Be given hormone replacement therapy (HRT) to treat symptoms of menopause. Follow these instructions at home: Lifestyle  Do not use any products that contain nicotine or tobacco, such as cigarettes, e-cigarettes, and chewing tobacco. If you need help quitting, ask your health care provider.  Do not use street drugs.  Do not share needles.  Ask your health care provider for help if you need support or information about quitting drugs. Alcohol use  Do not drink alcohol if: ? Your health care provider tells you not to drink. ? You are pregnant, may be pregnant, or are planning to become pregnant.  If you drink alcohol: ? Limit how much you use to 0-1 drink a day. ? Limit intake if you are breastfeeding.  Be aware of how much alcohol is in your drink. In the [place] one drink equals one 12 oz bottle of beer (355 mL), one 5 oz glass of wine (148 mL), or one 1 oz glass of hard liquor (44 mL). General instructions  Schedule regular health, dental, and eye exams.  Stay current with your vaccines.  Tell your health care provider if: ? You often feel depressed. ? You have ever been abused or do not feel safe at home. Summary  Adopting a healthy lifestyle and getting preventive care are important in promoting health and wellness.  Follow your health care provider's instructions about healthy  diet, exercising, and getting tested or screened for diseases.  Follow your health care provider's instructions on monitoring your cholesterol and blood pressure. This information is not intended to replace advice given to you by your health care provider. Make sure you discuss any questions you have with your health care provider. Document Revised: 01/16/2018 Document Reviewed: 01/16/2018 Elsevier Patient Education  2021 Elsevier Inc.  

## 2020-04-29 NOTE — Telephone Encounter (Signed)
Pt stated that PCP mentioned in the [place] he would be sending in a diet medication and the patient doesn't understand PCP now saying that her BMI is too low. I stated that I would check with PCP again. Please advise.

## 2020-04-30 ENCOUNTER — Other Ambulatory Visit: Payer: Self-pay | Admitting: Internal Medicine

## 2020-04-30 MED ORDER — PRAVASTATIN SODIUM 10 MG PO TABS: 10.0000 mg | ORAL_TABLET | Freq: Every day | ORAL | 1 refills | Status: DC

## 2020-05-01 ENCOUNTER — Other Ambulatory Visit: Payer: Self-pay | Admitting: Internal Medicine

## 2020-05-01 DIAGNOSIS — Z0001 Encounter for general adult medical examination with abnormal findings: Secondary | ICD-10-CM | POA: Insufficient documentation

## 2020-05-01 MED ORDER — RYBELSUS 3 MG PO TABS: 1.0000 | ORAL_TABLET | Freq: Every day | ORAL | 0 refills | Status: DC

## 2020-05-01 MED FILL — RYBELSUS 3 MG TABS: 3 | 30 days supply | Qty: 30 | Fill #0

## 2020-05-01 NOTE — Assessment & Plan Note (Signed)
Exam completed Labs reviewed Vaccines reviewed and updated Cancer screenings addressed Patient education was given.

## 2020-05-02 MED FILL — ALPRAZOLAM 1 MG TABS: 1 | 30 days supply | Qty: 90 | Fill #0

## 2020-05-13 ENCOUNTER — Other Ambulatory Visit (HOSPITAL_COMMUNITY): Payer: Self-pay

## 2020-05-18 ENCOUNTER — Other Ambulatory Visit (HOSPITAL_COMMUNITY): Payer: Self-pay

## 2020-05-18 DIAGNOSIS — E1165 Type 2 diabetes mellitus with hyperglycemia: Secondary | ICD-10-CM | POA: Diagnosis not present

## 2020-05-18 DIAGNOSIS — E039 Hypothyroidism, unspecified: Secondary | ICD-10-CM | POA: Diagnosis not present

## 2020-05-18 DIAGNOSIS — E782 Mixed hyperlipidemia: Secondary | ICD-10-CM | POA: Diagnosis not present

## 2020-05-18 MED ORDER — JARDIANCE 25 MG PO TABS
25.0000 mg | ORAL_TABLET | Freq: Every day | ORAL | 1 refills | Status: DC
  Filled 2020-05-18: qty 90, 90d supply, fill #0
  Filled 2020-09-02: qty 90, 90d supply, fill #1

## 2020-05-25 DIAGNOSIS — E782 Mixed hyperlipidemia: Secondary | ICD-10-CM | POA: Diagnosis not present

## 2020-05-25 DIAGNOSIS — E1165 Type 2 diabetes mellitus with hyperglycemia: Secondary | ICD-10-CM | POA: Diagnosis not present

## 2020-05-25 DIAGNOSIS — E039 Hypothyroidism, unspecified: Secondary | ICD-10-CM | POA: Diagnosis not present

## 2020-05-26 ENCOUNTER — Other Ambulatory Visit (HOSPITAL_COMMUNITY): Payer: Self-pay

## 2020-05-26 MED ORDER — SYNTHROID 137 MCG PO TABS
137.0000 ug | ORAL_TABLET | Freq: Every day | ORAL | 1 refills | Status: DC
  Filled 2020-05-26: qty 90, 90d supply, fill #0

## 2020-06-04 ENCOUNTER — Ambulatory Visit (HOSPITAL_COMMUNITY)
Admission: RE | Admit: 2020-06-04 | Discharge: 2020-06-04 | Disposition: A | Discharge status: Home / Self Care | Payer: Medicare HMO | Source: Ambulatory Visit | Attending: Nurse Practitioner | Admitting: Nurse Practitioner

## 2020-06-04 ENCOUNTER — Other Ambulatory Visit (HOSPITAL_COMMUNITY): Payer: Self-pay

## 2020-06-04 ENCOUNTER — Telehealth (INDEPENDENT_AMBULATORY_CARE_PROVIDER_SITE_OTHER): Payer: Medicare HMO | Admitting: Nurse Practitioner

## 2020-06-04 ENCOUNTER — Other Ambulatory Visit: Payer: Self-pay

## 2020-06-04 DIAGNOSIS — Z8616 Personal history of COVID-19: Secondary | ICD-10-CM

## 2020-06-04 DIAGNOSIS — R059 Cough, unspecified: Secondary | ICD-10-CM | POA: Insufficient documentation

## 2020-06-04 MED ORDER — PREDNISONE 20 MG PO TABS
20.0000 mg | ORAL_TABLET | Freq: Every day | ORAL | 0 refills | Status: AC
  Filled 2020-06-04: qty 5, 5d supply, fill #0

## 2020-06-04 MED ORDER — AZITHROMYCIN 250 MG PO TABS
ORAL_TABLET | ORAL | 0 refills | Status: AC
  Filled 2020-06-04: qty 6, 5d supply, fill #0

## 2020-06-04 NOTE — Progress Notes (Signed)
Virtual Visit via Telephone Note  I connected with Tasha [name] on 06/04/20 at  9:00 AM EDT by telephone and verified that I am speaking with the correct person using two identifiers.  Location: Patient: home Provider: office   I discussed the limitations, risks, security and privacy concerns of performing an evaluation and management service by telephone and the availability of in person appointments. I also discussed with the patient that there may be a patient responsible charge related to this service. The patient expressed understanding and agreed to proceed.   History of Present Illness:  Patient presents today for post-COVID care clinic visit through televisit.  Patient states that she has been sick for the past 3 weeks with cough and chest congestion.  She states that she feels like she has in the past when she has had pneumonia.  She did have a positive COVID test recently her home test.  She is fully vaccinated.  She also complains of extreme fatigue. Denies f/c/s, n/v/d, hemoptysis, PND, chest pain or edema.   Observations/Objective:  Vitals with BMI 04/29/2020 04/05/2020 03/25/2020  Height 5' 7"  5' 7"  5' 7"   Weight 164 lbs 163 lbs 10 oz 159 lbs 6 oz  BMI 25.68 47.09 62.83  Systolic 662 947 654  Diastolic 76 82 80  Pulse 64 64 60      Assessment and Plan:  Covid 19 Cough Chest congestion:   Stay well hydrated  Stay active  Deep breathing exercises  May take tylenol or fever or pain  May take mucinex DM twice daily  Will order chest x ray:  [facility] [address], [place] [address] [phone] MON - FRI 8:00 AM - 4:00 PM - WALK IN  Will order azithromycin  Will order prednisone   Follow up:  Follow up in 2 weeks or sooner if needed      I discussed the assessment and treatment plan with the patient. The patient was provided an opportunity to ask questions and all were answered. The patient agreed with the plan and  demonstrated an understanding of the instructions.   The patient was advised to call back or seek an in-person evaluation if the symptoms worsen or if the condition fails to improve as anticipated.  I provided 23 minutes of non-face-to-face time during this encounter.   Fenton Foy, NP

## 2020-06-04 NOTE — Patient Instructions (Signed)
Covid 19 Cough Chest congestion:   Stay well hydrated  Stay active  Deep breathing exercises  May take tylenol or fever or pain  May take mucinex DM twice daily  Will order chest x ray:  [facility] [address], [place] [address] [phone] MON - FRI 8:00 AM - 4:00 PM - WALK IN  Will order azithromycin  Will order prednisone   Follow up:  Follow up in 2 weeks or sooner if needed

## 2020-06-09 ENCOUNTER — Other Ambulatory Visit (HOSPITAL_COMMUNITY): Payer: Self-pay

## 2020-06-09 ENCOUNTER — Other Ambulatory Visit: Payer: Self-pay | Admitting: Internal Medicine

## 2020-06-09 DIAGNOSIS — F418 Other specified anxiety disorders: Secondary | ICD-10-CM

## 2020-06-09 MED FILL — Alprazolam Tab 1 MG: ORAL | 30 days supply | Qty: 90 | Fill #0 | Status: AC

## 2020-06-09 MED FILL — Sertraline HCl Tab 100 MG: ORAL | 45 days supply | Qty: 90 | Fill #0 | Status: AC

## 2020-06-10 ENCOUNTER — Other Ambulatory Visit (HOSPITAL_COMMUNITY): Payer: Self-pay

## 2020-06-10 ENCOUNTER — Other Ambulatory Visit: Payer: Self-pay | Admitting: Internal Medicine

## 2020-06-10 DIAGNOSIS — F333 Major depressive disorder, recurrent, severe with psychotic symptoms: Secondary | ICD-10-CM

## 2020-06-10 DIAGNOSIS — F418 Other specified anxiety disorders: Secondary | ICD-10-CM

## 2020-06-10 MED ORDER — BUPROPION HCL ER (XL) 150 MG PO TB24
150.0000 mg | ORAL_TABLET | Freq: Every day | ORAL | 1 refills | Status: DC
Start: 2020-06-10 — End: 2020-11-02
  Filled 2020-06-10: qty 90, 90d supply, fill #0
  Filled 2020-09-15: qty 90, 90d supply, fill #1

## 2020-06-10 NOTE — Telephone Encounter (Signed)
I see that the med was denied for "refill not appropriate". Can you explain your rationale so I can inform the pt?

## 2020-06-10 NOTE — Telephone Encounter (Signed)
Patient called and said that her pharmacy called her and told her that her refill request for buPROPion (WELLBUTRIN XL) 150 MG 24 hr tablet was denied. She said that she is out of medication and she is dizzy. She is requesting a call back at [phone]. Please advise

## 2020-06-15 ENCOUNTER — Ambulatory Visit: Payer: Medicare HMO

## 2020-06-24 ENCOUNTER — Ambulatory Visit: Payer: Medicare HMO

## 2020-06-25 ENCOUNTER — Ambulatory Visit: Payer: Medicare HMO

## 2020-07-15 ENCOUNTER — Other Ambulatory Visit (HOSPITAL_COMMUNITY): Payer: Self-pay

## 2020-07-15 ENCOUNTER — Other Ambulatory Visit: Payer: Self-pay

## 2020-07-15 ENCOUNTER — Ambulatory Visit (INDEPENDENT_AMBULATORY_CARE_PROVIDER_SITE_OTHER): Payer: Medicare HMO | Admitting: Internal Medicine

## 2020-07-15 ENCOUNTER — Encounter: Payer: Self-pay | Admitting: Internal Medicine

## 2020-07-15 VITALS — BP 138/82 | HR 72 | Temp 98.3°F | Resp 16 | Ht 67.0 in | Wt 167.0 lb

## 2020-07-15 DIAGNOSIS — E039 Hypothyroidism, unspecified: Secondary | ICD-10-CM

## 2020-07-15 DIAGNOSIS — K7581 Nonalcoholic steatohepatitis (NASH): Secondary | ICD-10-CM | POA: Diagnosis not present

## 2020-07-15 DIAGNOSIS — M79609 Pain in unspecified limb: Secondary | ICD-10-CM | POA: Diagnosis not present

## 2020-07-15 DIAGNOSIS — D696 Thrombocytopenia, unspecified: Secondary | ICD-10-CM

## 2020-07-15 DIAGNOSIS — Z720 Tobacco use: Secondary | ICD-10-CM | POA: Diagnosis not present

## 2020-07-15 DIAGNOSIS — J411 Mucopurulent chronic bronchitis: Secondary | ICD-10-CM | POA: Diagnosis not present

## 2020-07-15 DIAGNOSIS — E118 Type 2 diabetes mellitus with unspecified complications: Secondary | ICD-10-CM

## 2020-07-15 DIAGNOSIS — I10 Essential (primary) hypertension: Secondary | ICD-10-CM

## 2020-07-15 DIAGNOSIS — B351 Tinea unguium: Secondary | ICD-10-CM | POA: Diagnosis not present

## 2020-07-15 LAB — URINALYSIS, ROUTINE W REFLEX MICROSCOPIC
Bilirubin Urine: NEGATIVE
Hgb urine dipstick: NEGATIVE
Ketones, ur: NEGATIVE
Leukocytes,Ua: NEGATIVE
Nitrite: NEGATIVE
Specific Gravity, Urine: 1.025 (ref 1.000–1.030)
Total Protein, Urine: NEGATIVE
Urine Glucose: >=1000 — AB
Urobilinogen, UA: 0.2 (ref 0.0–1.0)
pH: 5.5 (ref 5.0–8.0)

## 2020-07-15 LAB — CBC WITH DIFFERENTIAL/PLATELET
Basophils Absolute: 0 10*3/uL (ref 0.0–0.1)
Basophils Relative: 0.4 % (ref 0.0–3.0)
Eosinophils Absolute: 0 10*3/uL (ref 0.0–0.7)
Eosinophils Relative: 0.1 % (ref 0.0–5.0)
HCT: 43.5 % (ref 36.0–46.0)
Hemoglobin: 14.5 g/dL (ref 12.0–15.0)
Lymphocytes Relative: 46.1 % — ABNORMAL HIGH (ref 12.0–46.0)
Lymphs Abs: 3.6 10*3/uL (ref 0.7–4.0)
MCHC: 33.3 g/dL (ref 30.0–36.0)
MCV: 95.6 fl (ref 78.0–100.0)
Monocytes Absolute: 0.5 10*3/uL (ref 0.1–1.0)
Monocytes Relative: 6.1 % (ref 3.0–12.0)
Neutro Abs: 3.7 10*3/uL (ref 1.4–7.7)
Neutrophils Relative %: 47.3 % (ref 43.0–77.0)
Platelets: 149 10*3/uL — ABNORMAL LOW (ref 150.0–400.0)
RBC: 4.55 Mil/uL (ref 3.87–5.11)
RDW: 13.3 % (ref 11.5–15.5)
WBC: 7.7 10*3/uL (ref 4.0–10.5)

## 2020-07-15 LAB — HEPATIC FUNCTION PANEL
ALT: 28 U/L (ref 0–35)
AST: 27 U/L (ref 0–37)
Albumin: 4.4 g/dL (ref 3.5–5.2)
Alkaline Phosphatase: 77 U/L (ref 39–117)
Bilirubin, Direct: 0.1 mg/dL (ref 0.0–0.3)
Total Bilirubin: 0.4 mg/dL (ref 0.2–1.2)
Total Protein: 7.3 g/dL (ref 6.0–8.3)

## 2020-07-15 LAB — MICROALBUMIN / CREATININE URINE RATIO
Creatinine,U: 79.2 mg/dL
Microalb Creat Ratio: 0.9 mg/g (ref 0.0–30.0)
Microalb, Ur: <0.7 mg/dL (ref 0.0–1.9)

## 2020-07-15 LAB — HEMOGLOBIN A1C: Hgb A1c MFr Bld: 7.1 % — ABNORMAL HIGH (ref 4.6–6.5)

## 2020-07-15 LAB — TSH: TSH: 1.68 u[IU]/mL (ref 0.35–4.50)

## 2020-07-15 MED ORDER — FLUCONAZOLE 150 MG PO TABS
300.0000 mg | ORAL_TABLET | ORAL | 0 refills | Status: DC
  Filled 2020-07-15: qty 24, 84d supply, fill #0

## 2020-07-15 MED ORDER — TRELEGY ELLIPTA 100-62.5-25 MCG/INH IN AEPB
1.0000 | INHALATION_SPRAY | Freq: Every day | RESPIRATORY_TRACT | 1 refills | Status: DC
  Filled 2020-07-15: qty 180, 90d supply, fill #0

## 2020-07-15 NOTE — Patient Instructions (Signed)

## 2020-07-15 NOTE — Progress Notes (Signed)
nyhomycosis  Established Patient Office Visit  Subjective:  Patient ID: Tasha [name], female    DOB: 06/06/1960  Age: 60 y.o. MRN: [id number]  CC:  Chief Complaint  Patient presents with   Cough   COPD   Hypothyroidism   Hypertension   Diabetes   This visit occurred during the SARS-CoV-2 public health emergency.  Safety protocols were in place, including screening questions prior to the visit, additional usage of staff PPE, and extensive cleaning of exam room while observing appropriate contact time as indicated for disinfecting solutions.    HPI Tasha [name] presents for f/up -   She complains of vaginal itching and a several month history of abnormal toenails and fingernails.  The nails are uncomfortable and thickened.  She also complains of chronic, intermittent episodes of chest pain.  Her last episode of chest pain was about 2 weeks ago.  It is an intermittent sharp sensation at rest.  She complains of chronic fatigue but denies diaphoresis, dyspnea on exertion, or hemoptysis.  She continues to smoke cigarettes and complains of a chronic cough with wheezing.  The cough is occasionally productive of clear phlegm.  Past Medical History:  Diagnosis Date   Allergy    Anemia    past hx    Anxiety state, unspecified    Arthralgia of temporomandibular joint    Arthritis    Asthma    as a child , not now per pt    Conversion disorder    Dementia ([place])    Depressive disorder, not elsewhere classified    Diabetes mellitus without complication ([place])    off all medicines as of 04-16-2019 PV    Dysphagia, unspecified(787.20)    Endometriosis    Esophageal reflux    Fibromyalgia    Headache(784.0)    Heart murmur    past hx murmur    Hx of adenomatous colonic polyps    Irritable bowel syndrome    Mitral valve disorders(424.0)    Myalgia and myositis, unspecified    Overweight(278.02)    Periodic limb movement disorder    Pure hypercholesterolemia    RLS (restless  legs syndrome)    Seizures ([place])    last episode 5 yrs ago    [facility])    TIA per pt many years ago    Unspecified hypothyroidism     Past Surgical History:  Procedure Laterality Date   ABDOMINAL HYSTERECTOMY     TAH   arm surgery     COLONOSCOPY  04/14/2008   FOOT SURGERY     LIPOMA EXCISION     BESIDES BELLY BUTTON   POLYPECTOMY     HPP x 1   TONSILLECTOMY     UPPER GASTROINTESTINAL ENDOSCOPY  2010   [facility]    WRIST SURGERY      Family History  Problem Relation Age of Onset   Heart disease Mother    Heart disease Father    Diabetes Father    Colon cancer Father 64       died age 80 from colon cancer spreading per pt    Breast cancer Maternal Aunt    Colon cancer Paternal Aunt    Stomach cancer Paternal Aunt    Lung cancer Paternal Aunt    Rectal cancer Paternal Aunt    Stomach cancer Paternal Uncle    Colon polyps Neg Hx    Esophageal cancer Neg Hx     Social History   Socioeconomic History   Marital  status: Married    Spouse name: Tasha [name]   Number of children: 2   Years of education: 12   Highest education level: Not on file  Occupational History   Occupation: Disabled    Employer: [employer]  Tobacco Use   Smoking status: Some Days    Packs/day: 1.00    Years: 40.00    Pack years: 40.00    Types: Cigarettes   Smokeless tobacco: Never   Tobacco comments:    1-2 per day  Vaping Use   Vaping Use: Some days   Substances: Nicotine, Flavoring  Substance and Sexual Activity   Alcohol use: Yes    Comment: rarely   Drug use: No   Sexual activity: Yes  Other Topics Concern   Not on file  Social History Narrative   Lives w/ husband Oneida Arenas Romaniello   Caffeine use: 2 cups coffee per day   Tea-3-4 drinks   Right handed    Social Determinants of Health   Financial Resource Strain: Low Risk    Difficulty of Paying Living Expenses: Not hard at all  Food Insecurity: No Food Insecurity   Worried About Charity fundraiser in  the Last Year: Never true   Ran Out of Food in the Last Year: Never true  Transportation Needs: No Transportation Needs   Lack of Transportation (Medical): No   Lack of Transportation (Non-Medical): No  Physical Activity: Not on file  Stress: Stress Concern Present   Feeling of Stress : Very much  Social Connections: Moderately Isolated   Frequency of Communication with Friends and Family: More than three times a week   Frequency of Social Gatherings with Friends and Family: More than three times a week   Attends Religious Services: Never   Marine scientist or Organizations: No   Attends Archivist Meetings: Never   Marital Status: Married  Human resources officer Violence: Not on file    Outpatient Medications Prior to Visit  Medication Sig Dispense Refill   ALPRAZolam (XANAX) 1 MG tablet TAKE 1 TABLET BY MOUTH 3 TIMES DAILY AS NEEDED FOR ANXIETY. 90 tablet 5   aspirin EC 81 MG tablet Take 1 tablet (81 mg total) by mouth daily. 90 tablet 1   Blood Glucose Monitoring Suppl (ACCU-CHEK GUIDE ME) w/Device KIT 1 Act by Does not apply route 3 (three) times daily as needed. 1 kit 0   buPROPion (WELLBUTRIN XL) 150 MG 24 hr tablet Take 1 tablet (150 mg total) by mouth daily. 90 tablet 1   empagliflozin (JARDIANCE) 25 MG TABS tablet Take 1 tablet (25 mg total) by mouth daily. 90 tablet 1   ezetimibe (ZETIA) 10 MG tablet TAKE 1 TABLET BY MOUTH DAILY. 90 tablet 1   icosapent Ethyl (VASCEPA) 1 g capsule TAKE 2 CAPSULES BY MOUTH 2 TIMES DAILY. 360 capsule 1   mupirocin nasal ointment (BACTROBAN NASAL) 2 % Place 1 application into the nose 2 (two) times daily. Use one-half of tube in each nostril twice daily for five (5) days. After application, press sides of nose together and gently massage. 10 g 0   mupirocin ointment (BACTROBAN) 2 % APPLY SMALL AMOUNT INSIDE NOSTRIL TWICE A DAY FOR 5 DAYS. AFTER APPLICATION, PRESS SIDES OF NOSE TOGETHER AND GENTLY MASSAGE. 22 g 0   olopatadine (PATANOL)  0.1 % ophthalmic solution Place 1 drop into both eyes 2 (two) times daily. 5 mL 0   omeprazole (PRILOSEC) 40 MG capsule TAKE 2 CAPSULES BY  MOUTH ONCE A DAY. 180 capsule 1   pioglitazone (ACTOS) 15 MG tablet TAKE 1 TABLET BY MOUTH DAILY 90 tablet 1   potassium chloride SA (KLOR-CON) 20 MEQ tablet TAKE 1 TABLET BY MOUTH 2 TIMES DAILY. 180 tablet 0   pravastatin (PRAVACHOL) 10 MG tablet TAKE 1 TABLET BY MOUTH ONCE A DAY 90 tablet 1   sertraline (ZOLOFT) 100 MG tablet TAKE 2 TABLETS BY MOUTH DAILY 90 tablet 1   SYNTHROID 112 MCG tablet TAKE 1 TABLET BY MOUTH DAILY BEFORE BREAKFAST. 90 tablet 0   Continuous Blood Gluc Receiver (FREESTYLE LIBRE 14 DAY READER) DEVI 1 Act by Does not apply route daily. 6 each 5   Continuous Blood Gluc Sensor (FREESTYLE LIBRE 14 DAY SENSOR) MISC 1 Act by Does not apply route daily. 6 each 5   Semaglutide 3 MG TABS TAKE 1 TABLET BY MOUTH DAILY. 30 tablet 0   Semaglutide 3 MG TABS TAKE 1 TABLET BY MOUTH DAILY. 90 tablet 1   SYNTHROID 137 MCG tablet TAKE 1 TABLET BY MOUTH DAILY 90 tablet 1   SYNTHROID 137 MCG tablet TAKE 1 TABLET BY MOUTH DAILY 90 tablet 1   SYNTHROID 137 MCG tablet Take 1 tablet (137 mcg total) by mouth daily. 90 tablet 1   No facility-administered medications prior to visit.    Allergies  Allergen Reactions   Farxiga [Dapagliflozin] Other (See Comments)    Vag yeast inf   Metformin And Related Diarrhea   Statins Other (See Comments)    Muscle weakness/pains   Augmentin [Amoxicillin-Pot Clavulanate] Other (See Comments)    Patient states she recently reported to dentist and was advised to report to providers as allergy.  States she got blisters under her eyes after taking Augmentin.   Crestor [Rosuvastatin] Other (See Comments)    Muscle aches   Lipitor [Atorvastatin] Other (See Comments)    Muscle aches   Zocor [Simvastatin] Other (See Comments)    Leg pain per patient/PE   Ozempic (0.25 Or 0.5 Mg-Dose) [Semaglutide(0.25 Or 0.13m-Dos)] Nausea  And Vomiting    ROS Review of Systems  Constitutional:  Positive for fatigue. Negative for chills, diaphoresis and unexpected weight change.  HENT: Negative.    Eyes: Negative.   Respiratory:  Positive for cough and wheezing. Negative for shortness of breath.   Cardiovascular:  Positive for chest pain. Negative for palpitations and leg swelling.  Gastrointestinal:  Negative for abdominal pain, constipation, diarrhea, nausea and vomiting.  Endocrine: Negative.   Genitourinary: Negative.  Negative for difficulty urinating and vaginal discharge.  Musculoskeletal:  Negative for arthralgias and myalgias.  Skin: Negative.  Negative for color change and pallor.  Neurological: Negative.  Negative for dizziness, weakness, light-headedness and headaches.  Hematological:  Negative for adenopathy. Does not bruise/bleed easily.  Psychiatric/Behavioral: Negative.       Objective:    Physical Exam Constitutional:      Appearance: Normal appearance.  HENT:     Nose: Nose normal.     Mouth/Throat:     Mouth: Mucous membranes are moist.  Eyes:     General: No scleral icterus.    Conjunctiva/sclera: Conjunctivae normal.  Cardiovascular:     Rate and Rhythm: Normal rate and regular rhythm.     Heart sounds: Normal heart sounds, S1 normal and S2 normal. No murmur heard.   No friction rub. No gallop.     Comments: EKG- Sinus bradycardia, 57 bpm No LVH or Q waves Pulmonary:     Effort: Pulmonary effort  is normal.     Breath sounds: No stridor. No wheezing, rhonchi or rales.  Abdominal:     General: Abdomen is flat.     Palpations: There is no mass.     Tenderness: There is no abdominal tenderness. There is no guarding.  Musculoskeletal:     Right lower leg: No edema.     Left lower leg: No edema.  Feet:     Right foot:     Toenail Condition: Right toenails are abnormally thick. Fungal disease present.    Left foot:     Toenail Condition: Left toenails are abnormally thick. Fungal  disease present.    Comments: Nearly all nails have dystrophy, lysis, subungual debris. Skin:    General: Skin is warm and dry.     Findings: No rash.  Neurological:     General: No focal deficit present.     Mental Status: She is alert.  Psychiatric:        Mood and Affect: Mood normal.        Behavior: Behavior normal.    BP 138/82 (BP Location: Left Arm, Patient Position: Sitting)   Pulse 72   Temp 98.3 F (36.8 C) (Oral)   Resp 16   Ht 5' 7" (1.702 m)   Wt 167 lb (75.8 kg)   SpO2 97%   BMI 26.16 kg/m  Wt Readings from Last 3 Encounters:  07/15/20 167 lb (75.8 kg)  04/29/20 164 lb (74.4 kg)  04/05/20 163 lb 9.6 oz (74.2 kg)     Health Maintenance Due  Topic Date Due   Pneumococcal Vaccine 92-50 Years old (1 - PCV) Never done   Zoster Vaccines- Shingrix (1 of 2) Never done   COVID-19 Vaccine (3 - Moderna risk series) 12/19/2019   COLONOSCOPY (Pts 45-13yr Insurance coverage will need to be confirmed)  06/22/2020    There are no preventive care reminders to display for this patient.  Lab Results  Component Value Date   TSH 1.68 07/15/2020   Lab Results  Component Value Date   WBC 7.7 07/15/2020   HGB 14.5 07/15/2020   HCT 43.5 07/15/2020   MCV 95.6 07/15/2020   PLT 149.0 (L) 07/15/2020   Lab Results  Component Value Date   NA 140 04/29/2020   K 4.4 04/29/2020   CO2 30 04/29/2020   GLUCOSE 123 (H) 04/29/2020   BUN 9 04/29/2020   CREATININE 0.81 04/29/2020   BILITOT 0.4 07/15/2020   ALKPHOS 77 07/15/2020   AST 27 07/15/2020   ALT 28 07/15/2020   PROT 7.3 07/15/2020   ALBUMIN 4.4 07/15/2020   CALCIUM 9.4 04/29/2020   ANIONGAP 8 12/20/2019   GFR 78.53 04/29/2020   Lab Results  Component Value Date   CHOL 165 12/23/2019   Lab Results  Component Value Date   HDL 35.20 (L) 12/23/2019   Lab Results  Component Value Date   LDLCALC 107 (H) 12/23/2019   Lab Results  Component Value Date   TRIG 113.0 12/23/2019   Lab Results  Component Value  Date   CHOLHDL 5 12/23/2019   Lab Results  Component Value Date   HGBA1C 7.1 (H) 07/15/2020      Assessment & Plan:   Problem List Items Addressed This Visit     Hypothyroidism - Primary    Her TSH is in the normal range Will continue the current T4 dose       Relevant Orders   TSH (Completed)   Nonalcoholic steatohepatitis ([facility])  Relevant Orders   Hepatic function panel (Completed)   Thrombocytopenia (HCC)   Relevant Orders   CBC with Differential/Platelet (Completed)   Type II diabetes mellitus with manifestations ([place])    Her blood sugar is adequately well controlled       Relevant Orders   Hemoglobin A1c (Completed)   Urinalysis, Routine w reflex microscopic (Completed)   Microalbumin / creatinine urine ratio (Completed)   Pain due to onychomycosis of nail   Relevant Medications   fluconazole (DIFLUCAN) 150 MG tablet   Mucopurulent chronic bronchitis (HCC)    I recommended that she quit smoking and start using a LAMA       Relevant Medications   fluconazole (DIFLUCAN) 150 MG tablet   TRELEGY ELLIPTA 100-62.5-25 MCG/INH AEPB   Tobacco abuse   Relevant Orders   Ambulatory Referral for Lung Cancer Scre   Other Visit Diagnoses     Hypertension, unspecified type       Relevant Orders   EKG 12-Lead (Completed)       Meds ordered this encounter  Medications   fluconazole (DIFLUCAN) 150 MG tablet    Sig: Take 2 tablets (300 mg total) by mouth once a week.    Dispense:  24 tablet    Refill:  0   TRELEGY ELLIPTA 100-62.5-25 MCG/INH AEPB    Sig: Inhale 1 puff into the lungs daily.    Dispense:  180 each    Refill:  1    Follow-up: Return in about 3 months (around 10/15/2020).    [name], MD

## 2020-07-16 ENCOUNTER — Other Ambulatory Visit (HOSPITAL_COMMUNITY): Payer: Self-pay

## 2020-07-16 MED FILL — Potassium Chloride Microencapsulated Crys ER Tab 20 mEq: ORAL | 90 days supply | Qty: 180 | Fill #0 | Status: AC

## 2020-07-16 MED FILL — Pioglitazone HCl Tab 15 MG (Base Equiv): ORAL | 90 days supply | Qty: 90 | Fill #0 | Status: AC

## 2020-07-16 MED FILL — Alprazolam Tab 1 MG: ORAL | 30 days supply | Qty: 90 | Fill #1 | Status: AC

## 2020-07-16 MED FILL — Omeprazole Cap Delayed Release 40 MG: ORAL | 90 days supply | Qty: 180 | Fill #0 | Status: AC

## 2020-07-16 MED FILL — Icosapent Ethyl Cap 1 GM: ORAL | 30 days supply | Qty: 120 | Fill #0 | Status: AC

## 2020-07-18 ENCOUNTER — Encounter: Payer: Self-pay | Admitting: Internal Medicine

## 2020-07-18 DIAGNOSIS — I10 Essential (primary) hypertension: Secondary | ICD-10-CM

## 2020-07-18 HISTORY — DX: Essential (primary) hypertension: I10

## 2020-07-18 NOTE — Assessment & Plan Note (Signed)
Her BP is well controled Her EKG is reassuring I think her CP is angina

## 2020-07-18 NOTE — Assessment & Plan Note (Signed)
Her TSH is in the normal range Will continue the current T4 dose

## 2020-07-18 NOTE — Assessment & Plan Note (Signed)
I recommended that she quit smoking and start using a LAMA

## 2020-07-18 NOTE — Assessment & Plan Note (Signed)
Her blood sugar is adequately well controlled

## 2020-07-20 ENCOUNTER — Telehealth: Payer: Self-pay | Admitting: Internal Medicine

## 2020-07-20 ENCOUNTER — Other Ambulatory Visit: Payer: Self-pay

## 2020-07-20 NOTE — Telephone Encounter (Signed)
She can, but I believe that to qualify for trelegy she needs to spend $800 on prescriptions for the year before they will approve for their assistance program   Linna Hoff

## 2020-07-20 NOTE — Telephone Encounter (Signed)
I am happy to look into if she will be eligible   Tasha [name]

## 2020-07-20 NOTE — Telephone Encounter (Signed)
    Patient states medication too costly ($300 copay) for   TRELEGY ELLIPTA 100-62.5-25 MCG/INH AEP

## 2020-07-20 NOTE — Patient Outreach (Signed)
[facility]) Care Management  07/20/2020  Tasha [name] December 14, 1960 [id number]   Telephone call to patient for disease management follow up.   No answer.  HIPAA compliant voice message left.    Plan: If no return call, RN CM will attempt patient again in September.  Jone Baseman, RN, MSN [place] Management Care Management Coordinator Direct Line [phone] Cell [phone] Toll Free: [phone]  Fax: [phone]

## 2020-08-24 ENCOUNTER — Other Ambulatory Visit (HOSPITAL_COMMUNITY): Payer: Self-pay

## 2020-08-24 MED FILL — Alprazolam Tab 1 MG: ORAL | 30 days supply | Qty: 90 | Fill #2 | Status: AC

## 2020-09-01 ENCOUNTER — Telehealth: Payer: Self-pay | Admitting: Internal Medicine

## 2020-09-01 NOTE — Telephone Encounter (Signed)
Patient called in about body temp  Patient took temp last night & temp was around 96... having cough,  small headache, having trouble breathing when outside  No COVID test & not sure if shes been around a COVID exposure  Offered VV... patient declined  Would like callback regarding low temp [phone]

## 2020-09-02 ENCOUNTER — Other Ambulatory Visit (HOSPITAL_COMMUNITY): Payer: Self-pay

## 2020-09-02 ENCOUNTER — Telehealth (INDEPENDENT_AMBULATORY_CARE_PROVIDER_SITE_OTHER): Payer: Medicare HMO | Admitting: Family Medicine

## 2020-09-02 ENCOUNTER — Encounter: Payer: Self-pay | Admitting: Family Medicine

## 2020-09-02 ENCOUNTER — Other Ambulatory Visit: Payer: Self-pay | Admitting: Internal Medicine

## 2020-09-02 VITALS — HR 83 | Temp 97.1°F

## 2020-09-02 DIAGNOSIS — R059 Cough, unspecified: Secondary | ICD-10-CM

## 2020-09-02 MED ORDER — BENZONATATE 200 MG PO CAPS
200.0000 mg | ORAL_CAPSULE | Freq: Two times a day (BID) | ORAL | 0 refills | Status: DC | PRN
  Filled 2020-09-02: qty 20, 10d supply, fill #0

## 2020-09-02 MED ORDER — EMPAGLIFLOZIN 25 MG PO TABS
25.0000 mg | ORAL_TABLET | Freq: Every day | ORAL | 1 refills | Status: DC
  Filled 2020-09-02 – 2020-09-15 (×2): qty 90, 90d supply, fill #0

## 2020-09-02 MED ORDER — ALBUTEROL SULFATE HFA 108 (90 BASE) MCG/ACT IN AERS
2.0000 | INHALATION_SPRAY | Freq: Four times a day (QID) | RESPIRATORY_TRACT | 0 refills | Status: DC | PRN
  Filled 2020-09-02: qty 8.5, 25d supply, fill #0

## 2020-09-02 MED FILL — Ezetimibe Tab 10 MG: ORAL | 90 days supply | Qty: 90 | Fill #0 | Status: AC

## 2020-09-02 MED FILL — Icosapent Ethyl Cap 1 GM: ORAL | 30 days supply | Qty: 120 | Fill #1 | Status: CN

## 2020-09-02 NOTE — Patient Instructions (Signed)
  HOME CARE TIPS:  -[place] testing information: [email or web address] OR [phone] Most pharmacies also offer testing and home test kits. If the Covid19 test is positive, please make a prompt follow up visit with your primary care office or with [facility] to discuss treatment options. Treatments for Covid19 are best given early in the course of the illness.   -I sent the medication(s) we discussed to your pharmacy: Meds ordered this encounter  Medications   albuterol (VENTOLIN HFA) 108 (90 Base) MCG/ACT inhaler    Sig: Inhale 2 puffs into the lungs every 6 (six) hours as needed for wheezing or shortness of breath.    Dispense:  8 g    Refill:  0   benzonatate (TESSALON) 200 MG capsule    Sig: Take 1 capsule (200 mg total) by mouth 2 (two) times daily as needed for cough.    Dispense:  20 capsule    Refill:  0     --can use nasal saline a few times per day if you have nasal congestion  -stay hydrated, drink plenty of fluids and eat small healthy meals - avoid dairy  -can take 1000 IU (48mg) Vit D3 and 100-500 mg of Vit C daily per instructions  -If the Covid test is positive, check out the [facility] for more information on home care, transmission and treatment for COVID19  -follow up with your doctor in 2-3 days unless improving and feeling better  -stay home while sick, except to seek medical care. If you have COVID19, ideally it would be best to stay home for a full 10 days since the onset of symptoms PLUS one day of no fever and feeling better. Wear a good mask that fits snugly (such as N95 or KN95) if around others to reduce the risk of transmission.  It was nice to meet you today, and I really hope you are feeling better soon. I help [facility] out with telemedicine visits on Tuesdays and Thursdays and am available for visits on those days. If you have any concerns or questions following this visit please schedule a follow up  visit with your Primary Care doctor or seek care at a local urgent care clinic to avoid delays in care.    Seek in person care or schedule a follow up video visit promptly if your symptoms worsen, new concerns arise or you are not improving with treatment. Call 911 and/or seek emergency care if your symptoms are severe or life threatening.

## 2020-09-02 NOTE — Progress Notes (Signed)
Virtual Visit via Video Note  I connected with Tasha [name]  on 09/02/20 at  3:00 PM EDT by a video enabled telemedicine application and verified that I am speaking with the correct person using two identifiers.  Location patient: home,  Location provider:work or home office Persons participating in the virtual visit: patient, provider  I discussed the limitations of evaluation and management by telemedicine and the availability of in person appointments. The patient expressed understanding and agreed to proceed.   HPI:  Acute telemedicine visit for cough: -Onset: 5 days ago; she did a home covid test today which was negative -Symptoms include:nasal congestion, cough, body aches, some wheezing at times - requesting inhaler (reports has used albuterol in the past when sick) -Denies:fever, NVD, CP, SOB, inability to eat/drink/get out of bed; son and husband have been sick but tested negative for covid -Has tried: none -Pertinent past medical history:see below, has asthma -Pertinent medication allergies: Allergies  Allergen Reactions   Farxiga [Dapagliflozin] Other (See Comments)    Vag yeast inf   Metformin And Related Diarrhea   Statins Other (See Comments)    Muscle weakness/pains   Augmentin [Amoxicillin-Pot Clavulanate] Other (See Comments)    Patient states she recently reported to dentist and was advised to report to providers as allergy.  States she got blisters under her eyes after taking Augmentin.   Crestor [Rosuvastatin] Other (See Comments)    Muscle aches   Lipitor [Atorvastatin] Other (See Comments)    Muscle aches   Zocor [Simvastatin] Other (See Comments)    Leg pain per patient/PE   Ozempic (0.25 Or 0.5 Mg-Dose) [Semaglutide(0.25 Or 0.52m-Dos)] Nausea And Vomiting  -COVID-19 vaccine status: vaccinated x 2 and had covid in april  ROS: See pertinent positives and negatives per HPI.  Past Medical History:  Diagnosis Date   Allergy    Anemia    past hx    Anxiety  state, unspecified    Arthralgia of temporomandibular joint    Arthritis    Asthma    as a child , not now per pt    Conversion disorder    [place]    Depressive disorder, not elsewhere classified    Diabetes mellitus without complication ([place]    off all medicines as of 04-16-2019 PV    Dysphagia, unspecified(787.20)    Endometriosis    Esophageal reflux    Fibromyalgia    Headache(784.0)    Heart murmur    past hx murmur    Hx of adenomatous colonic polyps    Hypertension 07/18/2020   Irritable bowel syndrome    Mitral valve disorders(424.0)    Myalgia and myositis, unspecified    Overweight(278.02)    Periodic limb movement disorder    Pure hypercholesterolemia    RLS (restless legs syndrome)    Seizures ([place]    last episode 5 yrs ago    [facility]    TIA per pt many years ago    Unspecified hypothyroidism     Past Surgical History:  Procedure Laterality Date   ABDOMINAL HYSTERECTOMY     TAH   arm surgery     COLONOSCOPY  04/14/2008   FOOT SURGERY     LIPOMA EXCISION     BESIDES BELLY BUTTON   POLYPECTOMY     HPP x 1   TONSILLECTOMY     UPPER GASTROINTESTINAL ENDOSCOPY  2010   [facility]    WRIST SURGERY       Current Outpatient Medications:  albuterol (VENTOLIN HFA) 108 (90 Base) MCG/ACT inhaler, Inhale 2 puffs into the lungs every 6 (six) hours as needed for wheezing or shortness of breath., Disp: 8 g, Rfl: 0   ALPRAZolam (XANAX) 1 MG tablet, TAKE 1 TABLET BY MOUTH 3 TIMES DAILY AS NEEDED FOR ANXIETY., Disp: 90 tablet, Rfl: 5   benzonatate (TESSALON) 200 MG capsule, Take 1 capsule (200 mg total) by mouth 2 (two) times daily as needed for cough., Disp: 20 capsule, Rfl: 0   Blood Glucose Monitoring Suppl (ACCU-CHEK GUIDE ME) w/Device KIT, 1 Act by Does not apply route 3 (three) times daily as needed., Disp: 1 kit, Rfl: 0   buPROPion (WELLBUTRIN XL) 150 MG 24 hr tablet, Take 1 tablet (150 mg total) by mouth daily., Disp: 90 tablet, Rfl: 1    empagliflozin (JARDIANCE) 25 MG TABS tablet, Take 1 tablet (25 mg total) by mouth daily., Disp: 90 tablet, Rfl: 1   ezetimibe (ZETIA) 10 MG tablet, TAKE 1 TABLET BY MOUTH DAILY., Disp: 90 tablet, Rfl: 1   fluconazole (DIFLUCAN) 150 MG tablet, Take 2 tablets (300 mg total) by mouth once a week., Disp: 24 tablet, Rfl: 0   icosapent Ethyl (VASCEPA) 1 g capsule, TAKE 2 CAPSULES BY MOUTH 2 TIMES DAILY., Disp: 360 capsule, Rfl: 1   levothyroxine (SYNTHROID) 137 MCG tablet, Take 137 mcg by mouth daily before breakfast., Disp: , Rfl:    omeprazole (PRILOSEC) 40 MG capsule, TAKE 2 CAPSULES BY MOUTH ONCE A DAY., Disp: 180 capsule, Rfl: 1   pioglitazone (ACTOS) 15 MG tablet, TAKE 1 TABLET BY MOUTH DAILY, Disp: 90 tablet, Rfl: 1   potassium chloride SA (KLOR-CON) 20 MEQ tablet, TAKE 1 TABLET BY MOUTH 2 TIMES DAILY., Disp: 180 tablet, Rfl: 0   pravastatin (PRAVACHOL) 10 MG tablet, TAKE 1 TABLET BY MOUTH ONCE A DAY, Disp: 90 tablet, Rfl: 1   sertraline (ZOLOFT) 100 MG tablet, TAKE 2 TABLETS BY MOUTH DAILY, Disp: 90 tablet, Rfl: 1  EXAM:  VITALS per patient if applicable:  GENERAL: alert, oriented, appears well and in no acute distress  HEENT: atraumatic, conjunttiva clear, no obvious abnormalities on inspection of external nose and ears  NECK: normal movements of the head and neck  LUNGS: on inspection no signs of respiratory distress, breathing rate appears normal, no obvious gross SOB, gasping or wheezing  CV: no obvious cyanosis  MS: moves all visible extremities without noticeable abnormality  PSYCH/NEURO: pleasant and cooperative, no obvious depression or anxiety, speech and thought processing grossly intact  ASSESSMENT AND PLAN:  Discussed the following assessment and plan:  Cough  -we discussed possible serious and likely etiologies, options for evaluation and workup, limitations of telemedicine visit vs in person visit, treatment, treatment risks and precautions. Pt prefers to treat via  telemedicine empirically rather than in person at this moment.  Query viral upper respiratory illness with bronchitis, influenza, versus other.  She reports that a number of family members have been sick with the same symptoms.  She reports they all tested negative for COVID.  She plans to do 1 more COVID test since she did a home test.  She opted for treatment with Tessalon and a refill on albuterol.  Reports she has asthma and has used albuterol in the past when she was sick. Advised to seek prompt in person care if worsening, new symptoms arise, or if is not improving with treatment. Discussed options for inperson care if PCP office not available. Did let this patient know that I  only do telemedicine on Tuesdays and Thursdays for [facility]. Advised to schedule follow up visit with PCP or [facility] if any further questions or concerns to avoid delays in care.   I discussed the assessment and treatment plan with the patient. The patient was provided an opportunity to ask questions and all were answered. The patient agreed with the plan and demonstrated an understanding of the instructions.     Lucretia Kern, DO

## 2020-09-04 ENCOUNTER — Other Ambulatory Visit (HOSPITAL_COMMUNITY): Payer: Self-pay

## 2020-09-06 ENCOUNTER — Other Ambulatory Visit (HOSPITAL_COMMUNITY): Payer: Self-pay

## 2020-09-07 ENCOUNTER — Other Ambulatory Visit (HOSPITAL_COMMUNITY): Payer: Self-pay

## 2020-09-07 MED ORDER — JARDIANCE 25 MG PO TABS: 25.0000 mg | ORAL_TABLET | Freq: Every day | ORAL | 1 refills | Status: DC

## 2020-09-07 MED ORDER — JARDIANCE 25 MG PO TABS
25.0000 mg | ORAL_TABLET | Freq: Every day | ORAL | 1 refills | Status: DC
  Filled 2020-09-07: qty 90, 90d supply, fill #0

## 2020-09-15 ENCOUNTER — Other Ambulatory Visit (HOSPITAL_COMMUNITY): Payer: Self-pay

## 2020-09-15 ENCOUNTER — Other Ambulatory Visit: Payer: Self-pay | Admitting: Internal Medicine

## 2020-09-15 DIAGNOSIS — E876 Hypokalemia: Secondary | ICD-10-CM

## 2020-09-15 MED ORDER — POTASSIUM CHLORIDE CRYS ER 20 MEQ PO TBCR
EXTENDED_RELEASE_TABLET | Freq: Two times a day (BID) | ORAL | 0 refills | Status: DC
  Filled 2020-09-15 – 2020-10-29 (×2): qty 180, 90d supply, fill #0

## 2020-09-15 MED FILL — Icosapent Ethyl Cap 1 GM: ORAL | 30 days supply | Qty: 120 | Fill #1 | Status: AC

## 2020-09-15 MED FILL — Omeprazole Cap Delayed Release 40 MG: ORAL | 90 days supply | Qty: 180 | Fill #1 | Status: CN

## 2020-09-15 MED FILL — Sertraline HCl Tab 100 MG: ORAL | 45 days supply | Qty: 90 | Fill #1 | Status: AC

## 2020-09-17 ENCOUNTER — Other Ambulatory Visit (HOSPITAL_COMMUNITY): Payer: Self-pay

## 2020-09-17 DIAGNOSIS — E1165 Type 2 diabetes mellitus with hyperglycemia: Secondary | ICD-10-CM | POA: Diagnosis not present

## 2020-09-17 DIAGNOSIS — E039 Hypothyroidism, unspecified: Secondary | ICD-10-CM | POA: Diagnosis not present

## 2020-09-17 DIAGNOSIS — E782 Mixed hyperlipidemia: Secondary | ICD-10-CM | POA: Diagnosis not present

## 2020-09-17 MED ORDER — RYBELSUS 7 MG PO TABS
ORAL_TABLET | ORAL | 5 refills | Status: DC
  Filled 2020-09-17 – 2020-10-29 (×2): qty 30, 30d supply, fill #0

## 2020-09-27 ENCOUNTER — Other Ambulatory Visit (HOSPITAL_COMMUNITY): Payer: Self-pay

## 2020-10-07 ENCOUNTER — Other Ambulatory Visit (HOSPITAL_COMMUNITY): Payer: Self-pay

## 2020-10-07 MED FILL — Alprazolam Tab 1 MG: ORAL | 30 days supply | Qty: 90 | Fill #3 | Status: AC

## 2020-10-19 ENCOUNTER — Other Ambulatory Visit: Payer: Self-pay

## 2020-10-19 NOTE — Patient Instructions (Signed)
Goals Addressed               This Visit's Progress     COMPLETED: [facility]-Make and Keep All Appointments   On track     Timeframe:  Long-Range Goal Priority:  High Start Date:  01/14/20                           Expected End Date:  05/06/20                      - arrange a ride through an agency 1 week before appointment - keep a calendar with appointment dates    Why is this important?   Part of staying healthy is seeing the doctor for follow-up care.  If you forget your appointments, there are some things you can do to stay on track.    Notes:       THN-Monitor and Manage My Blood Sugar-Diabetes Type 2 (pt-stated)   On track     Barriers: Health Behaviors Timeframe:  Long-Range Goal Priority:  High Start Date:  01/14/20                           Expected End Date:  08/05/21  Follow up: 12/06/20   - check blood sugar at prescribed times - take the blood sugar log to all doctor visits    Why is this important?   Checking your blood sugar at home helps to keep it from getting very high or very low.  Writing the results in a diary or log helps the doctor know how to care for you.  Your blood sugar log should have the time, date and the results.  Also, write down the amount of insulin or other medicine that you take.  Other information, like what you ate, exercise done and how you were feeling, will also be helpful.     Notes: 10/19/20 Patient reports not checking sugars regularly. A1c report was 7.4 per patient.   Diabetes Management Discussed: Medication adherence Reviewed importance of limiting carbs such as rice, potatoes, breads, and pastas. Also discussed limiting sweets and sugary drinks.  Discussed importance of portion control.  Also discussed importance of annual exams, foot exams, and eye exams.

## 2020-10-19 NOTE — Patient Outreach (Signed)
[facility]) Care Management  [place]  10/19/2020   Tasha [name] 1960-08-07 [id number]  Subjective: Telephone call to patient for follow up. She reports that she hs not been checking sugars regularly but A1c 7.4.  Discussed diabetes management.  Patient reports she now has [employer] insurance.  Will follow up prior to transferring to [facility].     Objective:   Encounter Medications:  Outpatient Encounter Medications as of 10/19/2020  Medication Sig   albuterol (VENTOLIN HFA) 108 (90 Base) MCG/ACT inhaler Inhale 2 puffs into the lungs every 6 (six) hours as needed for wheezing or shortness of breath.   ALPRAZolam (XANAX) 1 MG tablet TAKE 1 TABLET BY MOUTH 3 TIMES DAILY AS NEEDED FOR ANXIETY.   benzonatate (TESSALON) 200 MG capsule Take 1 capsule (200 mg total) by mouth 2 (two) times daily as needed for cough.   Blood Glucose Monitoring Suppl (ACCU-CHEK GUIDE ME) w/Device KIT 1 Act by Does not apply route 3 (three) times daily as needed.   buPROPion (WELLBUTRIN XL) 150 MG 24 hr tablet Take 1 tablet (150 mg total) by mouth daily.   empagliflozin (JARDIANCE) 25 MG TABS tablet Take 1 tablet (25 mg total) by mouth daily.   empagliflozin (JARDIANCE) 25 MG TABS tablet Take 1 tablet (25 mg total) by mouth daily.   empagliflozin (JARDIANCE) 25 MG TABS tablet Take 1 tablet (25 mg total) by mouth daily.   ezetimibe (ZETIA) 10 MG tablet TAKE 1 TABLET BY MOUTH DAILY.   fluconazole (DIFLUCAN) 150 MG tablet Take 2 tablets (300 mg total) by mouth once a week.   icosapent Ethyl (VASCEPA) 1 g capsule TAKE 2 CAPSULES BY MOUTH 2 TIMES DAILY.   levothyroxine (SYNTHROID) 137 MCG tablet Take 137 mcg by mouth daily before breakfast.   omeprazole (PRILOSEC) 40 MG capsule TAKE 2 CAPSULES BY MOUTH ONCE A DAY.   pioglitazone (ACTOS) 15 MG tablet TAKE 1 TABLET BY MOUTH DAILY   potassium chloride SA (KLOR-CON) 20 MEQ tablet TAKE 1 TABLET BY MOUTH 2 TIMES DAILY.    pravastatin (PRAVACHOL) 10 MG tablet TAKE 1 TABLET BY MOUTH ONCE A DAY   Semaglutide (RYBELSUS) 7 MG TABS Take 1 tablet (7 mg total) by mouth daily.   sertraline (ZOLOFT) 100 MG tablet TAKE 2 TABLETS BY MOUTH DAILY   [DISCONTINUED] ARIPiprazole (ABILIFY) 2 MG tablet Take 1 tablet (2 mg total) by mouth daily.   [DISCONTINUED] divalproex (DEPAKOTE) 500 MG DR tablet Take 1 tablet (500 mg total) by mouth 2 (two) times daily.   [DISCONTINUED] omega-3 acid ethyl esters (LOVAZA) 1 g capsule Take 2 capsules (2 g total) by mouth 2 (two) times daily.   No facility-administered encounter medications on file as of 10/19/2020.    Functional Status:  In your present state of health, do you have any difficulty performing the following activities: 10/19/2020 03/25/2020  Hearing? N Y  Comment - having some decreased hearing in left ear  Vision? N N  Difficulty concentrating or making decisions? Y Y  Comment forgetful at times -  Walking or climbing stairs? N N  Dressing or bathing? N N  Doing errands, shopping? N N  Preparing Food and eating ? N N  Using the Toilet? N N  In the past six months, have you accidently leaked urine? N N  Do you have problems with loss of bowel control? N N  Managing your Medications? N N  Managing your Finances? N N  Housekeeping or  managing your Housekeeping? N N  Some recent data might be hidden    Fall/Depression Screening: Fall Risk  10/19/2020 03/25/2020 10/02/2019  Falls in the past year? 0 1 1  Number falls in past yr: - 0 0  Injury with Fall? - 0 1  Risk for fall due to : - No Fall Risks History of fall(s)  Follow up - - Falls prevention discussed   PHQ 2/9 Scores 10/19/2020 03/25/2020 01/14/2020 10/02/2019 07/22/2019 04/11/2019 07/25/2018  PHQ - 2 Score 1 3 1 3 1 2  0  PHQ- 9 Score - 15 - 13 4 4 2     Assessment:   Care Plan Care Plan : Diabetes Type 2 (Adult)  Updates made by Jon Billings, RN since 10/19/2020 12:00 AM     Problem: Disease Progression (Diabetes,  Type 2)      Long-Range Goal: Disease Progression Prevented or Minimized as evidenced by A1c less than 7.0   Start Date: 01/14/2020  Expected End Date: 05/06/2020  This Visit's Progress: On track  Priority: High  Note:   Evidence-based guidance:  Prepare patient for laboratory and diagnostic exams based on risk and presentation.  Encourage lifestyle changes, such as increased intake of plant-based foods, stress reduction, consistent physical activity and smoking cessation to prevent long-term complications and chronic disease.   Individualize activity and exercise recommendations while considering potential limitations, such as neuropathy, retinopathy or the ability to prevent hyperglycemia or hypoglycemia.   Prepare patient for use of pharmacologic therapy that may include antihypertensive, analgesic, prostaglandin E1 with periodic adjustments, based on presenting chronic condition and laboratory results.  Assess signs/symptoms and risk factors for hypertension, sleep-disordered breathing, neuropathy (including changes in gait and balance), retinopathy, nephropathy and sexual dysfunction.  Address pregnancy planning and contraceptive choice, especially when prescribing antihypertensive or statin.  Ensure completion of annual comprehensive foot exam and dilated eye exam.   Implement additional individualized goals and interventions based on identified risk factors.  Prepare patient for consultation or referral for specialist care, such as ophthalmology, neurology, cardiology, podiatry, nephrology or perinatology.   Notes:     Task: Monitor and Manage Follow-up for Comorbidities   Priority: Routine  Responsible User: Jon Billings, RN  Note:   Care Management Activities:    - healthy lifestyle promoted - signs/symptoms of comorbidities identified    Notes: 10/19/20 Patient reports A1c 7.4. Discussed diabetes management.   Diabetes Management Discussed: Medication adherence Reviewed  importance of limiting carbs such as rice, potatoes, breads, and pastas. Also discussed limiting sweets and sugary drinks.  Discussed importance of portion control.  Also discussed importance of annual exams, foot exams, and eye exams.        Goals Addressed               This Visit's Progress     COMPLETED: THN-Make and Keep All Appointments   On track     Timeframe:  Long-Range Goal Priority:  High Start Date:  01/14/20                           Expected End Date:  05/06/20                      - arrange a ride through an agency 1 week before appointment - keep a calendar with appointment dates    Why is this important?   Part of staying healthy is seeing the doctor for follow-up care.  If you forget your appointments, there are some things you can do to stay on track.    Notes:       THN-Monitor and Manage My Blood Sugar-Diabetes Type 2 (pt-stated)   On track     Barriers: Health Behaviors Timeframe:  Long-Range Goal Priority:  High Start Date:  01/14/20                           Expected End Date:  08/05/21  Follow up: 12/06/20   - check blood sugar at prescribed times - take the blood sugar log to all doctor visits    Why is this important?   Checking your blood sugar at home helps to keep it from getting very high or very low.  Writing the results in a diary or log helps the doctor know how to care for you.  Your blood sugar log should have the time, date and the results.  Also, write down the amount of insulin or other medicine that you take.  Other information, like what you ate, exercise done and how you were feeling, will also be helpful.     Notes: 10/19/20 Patient reports not checking sugars regularly. A1c report was 7.4 per patient.   Diabetes Management Discussed: Medication adherence Reviewed importance of limiting carbs such as rice, potatoes, breads, and pastas. Also discussed limiting sweets and sugary drinks.  Discussed importance of portion control.   Also discussed importance of annual exams, foot exams, and eye exams.          Plan:  Follow-up: Patient agrees to Care Plan and Follow-up. Follow-up in 1 month(s)  Jone Baseman, RN, MSN [facility] Care Management Coordinator Direct Line [phone] Cell [phone] Toll Free: [phone]  Fax: [phone]

## 2020-10-26 ENCOUNTER — Ambulatory Visit: Payer: Self-pay

## 2020-10-29 ENCOUNTER — Other Ambulatory Visit (HOSPITAL_COMMUNITY): Payer: Self-pay

## 2020-10-29 ENCOUNTER — Other Ambulatory Visit: Payer: Self-pay | Admitting: Internal Medicine

## 2020-10-29 DIAGNOSIS — F418 Other specified anxiety disorders: Secondary | ICD-10-CM

## 2020-10-29 MED ORDER — SERTRALINE HCL 100 MG PO TABS
ORAL_TABLET | Freq: Every day | ORAL | 0 refills | Status: DC
  Filled 2020-10-29: qty 90, 45d supply, fill #0

## 2020-10-29 MED ORDER — SYNTHROID 137 MCG PO TABS
ORAL_TABLET | ORAL | 1 refills | Status: DC
  Filled 2020-10-29: qty 90, 90d supply, fill #0
  Filled 2021-01-28 – 2021-02-14 (×2): qty 90, 90d supply, fill #1

## 2020-10-29 MED FILL — Omeprazole Cap Delayed Release 40 MG: ORAL | 90 days supply | Qty: 180 | Fill #1 | Status: AC

## 2020-10-29 MED FILL — Pioglitazone HCl Tab 15 MG (Base Equiv): ORAL | 90 days supply | Qty: 90 | Fill #1 | Status: AC

## 2020-10-29 MED FILL — Icosapent Ethyl Cap 1 GM: ORAL | 30 days supply | Qty: 120 | Fill #2 | Status: CN

## 2020-10-30 ENCOUNTER — Other Ambulatory Visit (HOSPITAL_COMMUNITY): Payer: Self-pay

## 2020-11-01 ENCOUNTER — Other Ambulatory Visit: Payer: Self-pay

## 2020-11-02 ENCOUNTER — Ambulatory Visit (INDEPENDENT_AMBULATORY_CARE_PROVIDER_SITE_OTHER): Payer: Medicare Other | Admitting: Internal Medicine

## 2020-11-02 ENCOUNTER — Other Ambulatory Visit (HOSPITAL_COMMUNITY): Payer: Self-pay

## 2020-11-02 ENCOUNTER — Encounter: Payer: Self-pay | Admitting: Internal Medicine

## 2020-11-02 VITALS — BP 124/66 | HR 70 | Temp 98.0°F | Ht 67.0 in | Wt 172.0 lb

## 2020-11-02 DIAGNOSIS — F333 Major depressive disorder, recurrent, severe with psychotic symptoms: Secondary | ICD-10-CM | POA: Diagnosis not present

## 2020-11-02 DIAGNOSIS — E876 Hypokalemia: Secondary | ICD-10-CM

## 2020-11-02 DIAGNOSIS — E039 Hypothyroidism, unspecified: Secondary | ICD-10-CM

## 2020-11-02 DIAGNOSIS — K219 Gastro-esophageal reflux disease without esophagitis: Secondary | ICD-10-CM | POA: Diagnosis not present

## 2020-11-02 DIAGNOSIS — K7581 Nonalcoholic steatohepatitis (NASH): Secondary | ICD-10-CM | POA: Diagnosis not present

## 2020-11-02 DIAGNOSIS — E118 Type 2 diabetes mellitus with unspecified complications: Secondary | ICD-10-CM

## 2020-11-02 DIAGNOSIS — Z23 Encounter for immunization: Secondary | ICD-10-CM

## 2020-11-02 DIAGNOSIS — E785 Hyperlipidemia, unspecified: Secondary | ICD-10-CM | POA: Diagnosis not present

## 2020-11-02 DIAGNOSIS — Z1231 Encounter for screening mammogram for malignant neoplasm of breast: Secondary | ICD-10-CM

## 2020-11-02 DIAGNOSIS — Z720 Tobacco use: Secondary | ICD-10-CM | POA: Diagnosis not present

## 2020-11-02 DIAGNOSIS — D696 Thrombocytopenia, unspecified: Secondary | ICD-10-CM | POA: Diagnosis not present

## 2020-11-02 DIAGNOSIS — R0789 Other chest pain: Secondary | ICD-10-CM

## 2020-11-02 DIAGNOSIS — I10 Essential (primary) hypertension: Secondary | ICD-10-CM | POA: Diagnosis not present

## 2020-11-02 LAB — BASIC METABOLIC PANEL
BUN: 13 mg/dL (ref 6–23)
CO2: 26 mEq/L (ref 19–32)
Calcium: 9.8 mg/dL (ref 8.4–10.5)
Chloride: 104 mEq/L (ref 96–112)
Creatinine, Ser: 0.77 mg/dL (ref 0.40–1.20)
GFR: 83.15 mL/min (ref >60.00)
Glucose, Bld: 125 mg/dL — ABNORMAL HIGH (ref 70–99)
Potassium: 4.4 mEq/L (ref 3.5–5.1)
Sodium: 138 mEq/L (ref 135–145)

## 2020-11-02 LAB — CBC WITH DIFFERENTIAL/PLATELET
Basophils Absolute: 0 10*3/uL (ref 0.0–0.1)
Basophils Relative: 0.7 % (ref 0.0–3.0)
Eosinophils Absolute: 0 10*3/uL (ref 0.0–0.7)
Eosinophils Relative: 0.2 % (ref 0.0–5.0)
HCT: 43.3 % (ref 36.0–46.0)
Hemoglobin: 14.4 g/dL (ref 12.0–15.0)
Lymphocytes Relative: 40.8 % (ref 12.0–46.0)
Lymphs Abs: 2.7 10*3/uL (ref 0.7–4.0)
MCHC: 33.1 g/dL (ref 30.0–36.0)
MCV: 95.2 fl (ref 78.0–100.0)
Monocytes Absolute: 0.3 10*3/uL (ref 0.1–1.0)
Monocytes Relative: 5.3 % (ref 3.0–12.0)
Neutro Abs: 3.5 10*3/uL (ref 1.4–7.7)
Neutrophils Relative %: 53 % (ref 43.0–77.0)
Platelets: 140 10*3/uL — ABNORMAL LOW (ref 150.0–400.0)
RBC: 4.55 Mil/uL (ref 3.87–5.11)
RDW: 13.8 % (ref 11.5–15.5)
WBC: 6.6 10*3/uL (ref 4.0–10.5)

## 2020-11-02 LAB — TROPONIN I (HIGH SENSITIVITY): High Sens Troponin I: 4 ng/L (ref 2–17)

## 2020-11-02 LAB — BRAIN NATRIURETIC PEPTIDE: Pro B Natriuretic peptide (BNP): 32 pg/mL (ref 0.0–100.0)

## 2020-11-02 MED ORDER — EZETIMIBE 10 MG PO TABS
ORAL_TABLET | Freq: Every day | ORAL | 1 refills | Status: DC
  Filled 2020-11-02: qty 90, fill #0
  Filled 2020-12-24: qty 90, 90d supply, fill #0
  Filled 2021-01-28 – 2021-04-01 (×2): qty 90, 90d supply, fill #1

## 2020-11-02 MED ORDER — SHINGRIX 50 MCG/0.5ML IM SUSR
0.5000 mL | Freq: Once | INTRAMUSCULAR | 1 refills | Status: AC
  Filled 2020-11-02: qty 0.5, 1d supply, fill #0

## 2020-11-02 MED ORDER — PIOGLITAZONE HCL 15 MG PO TABS
ORAL_TABLET | Freq: Every day | ORAL | 1 refills | Status: DC
  Filled 2020-11-02: qty 90, fill #0
  Filled 2021-02-14: qty 90, 90d supply, fill #0
  Filled 2021-05-30: qty 90, 90d supply, fill #1

## 2020-11-02 MED ORDER — BUPROPION HCL ER (XL) 150 MG PO TB24
150.0000 mg | ORAL_TABLET | Freq: Every day | ORAL | 1 refills | Status: DC
  Filled 2020-11-02 – 2020-12-24 (×2): qty 90, 90d supply, fill #0
  Filled 2021-01-28 – 2021-04-01 (×2): qty 90, 90d supply, fill #1

## 2020-11-02 MED ORDER — OMEPRAZOLE 40 MG PO CPDR
DELAYED_RELEASE_CAPSULE | Freq: Every day | ORAL | 1 refills | Status: DC
  Filled 2020-11-02: qty 180, fill #0
  Filled 2020-12-24: qty 180, 90d supply, fill #0
  Filled 2021-01-28: qty 60, 30d supply, fill #1

## 2020-11-02 MED ORDER — POTASSIUM CHLORIDE CRYS ER 20 MEQ PO TBCR
EXTENDED_RELEASE_TABLET | Freq: Two times a day (BID) | ORAL | 0 refills | Status: DC
  Filled 2020-11-02: qty 180, fill #0
  Filled 2021-02-14: qty 180, 90d supply, fill #0

## 2020-11-02 MED ORDER — RYBELSUS 14 MG PO TABS
14.0000 mg | ORAL_TABLET | Freq: Every day | ORAL | 0 refills | Status: DC
  Filled 2020-11-02: qty 90, 90d supply, fill #0

## 2020-11-02 NOTE — Patient Instructions (Signed)
Nonspecific Chest Pain, Adult °Chest pain is an uncomfortable, tight, or painful feeling in the chest. The pain can feel like a crushing, aching, or squeezing pressure. A person can feel a burning or tingling sensation. Chest pain can also be felt in your back, neck, jaw, shoulder, or arm. This pain can be worse when you move, sneeze, or take a deep breath. °Chest pain can be caused by a condition that is life-threatening. This must be treated right away. It can also be caused by something that is not life-threatening. If you have chest pain, it can be hard to know the difference, so it is important to get help right away to make sure that you do not have a serious condition. °Some life-threatening causes of chest pain include: °Heart attack. °A tear in the body's main blood vessel (aortic dissection). °Inflammation around your heart (pericarditis). °A problem in the lungs, such as a blood clot (pulmonary embolism) or a collapsed lung (pneumothorax). °Some non life-threatening causes of chest pain include: °Heartburn. °Anxiety or stress. °Damage to the bones, muscles, and cartilage that make up your chest wall. °Pneumonia or bronchitis. °Shingles infection (varicella-zoster virus). °Your chest pain may come and go. It may also be constant. Your health care provider will do tests and other studies to find the cause of your pain. Treatment will depend on the cause of your chest pain. °Follow these instructions at home: °Medicines °Take over-the-counter and prescription medicines only as told by your health care provider. °If you were prescribed an antibiotic medicine, take it as told by your health care provider. Do not stop taking the antibiotic even if you start to feel better. °Activity °Avoid any activities that cause chest pain. °Do not lift anything that is heavier than 10 lb (4.5 kg), or the limit that you are told, until your health care provider says that it is safe. °Rest as directed by your health care  provider. °Return to your normal activities only as told by your health care provider. Ask your health care provider what activities are safe for you. °Lifestyle °  °Do not use any products that contain nicotine or tobacco, such as cigarettes, e-cigarettes, and chewing tobacco. If you need help quitting, ask your health care provider. °Do not drink alcohol. °Make healthy lifestyle changes as recommended. These may include: °Getting regular exercise. Ask your health care provider to suggest some exercises that are safe for you. °Eating a heart-healthy diet. This includes plenty of fresh fruits and vegetables, whole grains, low-fat (lean) protein, and low-fat dairy products. A dietitian can help you find healthy eating options. °Maintaining a healthy weight. °Managing any other health conditions you may have, such as high blood pressure (hypertension) or diabetes. °Reducing stress, such as with yoga or relaxation techniques. °General instructions °Pay attention to any changes in your symptoms. °It is up to you to get the results of any tests that were done. Ask your health care provider, or the department that is doing the tests, when your results will be ready. °Keep all follow-up visits as told by your health care provider. This is important. °You may be asked to go for further testing if your chest pain does not go away. °Contact a health care provider if: °Your chest pain does not go away. °You feel depressed. °You have a fever. °You notice changes in your symptoms or develop new symptoms. °Get help right away if: °Your chest pain gets worse. °You have a cough that gets worse, or you cough up   blood. °You have severe pain in your abdomen. °You faint. °You have sudden, unexplained chest discomfort. °You have sudden, unexplained discomfort in your arms, back, neck, or jaw. °You have shortness of breath at any time. °You suddenly start to sweat, or your skin gets clammy. °You feel nausea or you vomit. °You suddenly  feel lightheaded or dizzy. °You have severe weakness, or unexplained weakness or fatigue. °Your heart begins to beat quickly, or it feels like it is skipping beats. °These symptoms may represent a serious problem that is an emergency. Do not wait to see if the symptoms will go away. Get medical help right away. Call your local emergency services (911 in the [place] Do not drive yourself to the hospital. °Summary °Chest pain can be caused by a condition that is serious and requires urgent treatment. It may also be caused by something that is not life-threatening. °Your health care provider may do lab tests and other studies to find the cause of your pain. °Follow your health care provider's instructions on taking medicines, making lifestyle changes, and getting emergency treatment if symptoms become worse. °Keep all follow-up visits as told by your health care provider. This includes visits for any further testing if your chest pain does not go away. °This information is not intended to replace advice given to you by your health care provider. Make sure you discuss any questions you have with your health care provider. °Document Revised: 04/08/2020 Document Reviewed: 04/08/2020 °Elsevier Patient Education © 2022 [employer] ° °

## 2020-11-02 NOTE — Progress Notes (Signed)
Subjective:  Patient ID: Tasha [name], female    DOB: 09-04-1960  Age: 60 y.o. MRN: [id number]  CC: Diabetes and Hypothyroidism  This visit occurred during the SARS-CoV-2 public health emergency.  Safety protocols were in place, including screening questions prior to the visit, additional usage of staff PPE, and extensive cleaning of exam room while observing appropriate contact time as indicated for disinfecting solutions.    HPI Tasha [name] presents for f/up -  She continues to have a positive review of systems.  She complains of fatigue, poor memory, hot spells, and a chronic sensation of heaviness under her anterior chest.  She says the heaviness is not exacerbated by activity.  She denies cough, wheezing, hemoptysis, diaphoresis, dyspnea on exertion, palpitations, or edema.  Outpatient Medications Prior to Visit  Medication Sig Dispense Refill   albuterol (VENTOLIN HFA) 108 (90 Base) MCG/ACT inhaler Inhale 2 puffs into the lungs every 6 (six) hours as needed for wheezing or shortness of breath. 8.5 g 0   ALPRAZolam (XANAX) 1 MG tablet TAKE 1 TABLET BY MOUTH 3 TIMES DAILY AS NEEDED FOR ANXIETY. 90 tablet 5   empagliflozin (JARDIANCE) 25 MG TABS tablet Take 1 tablet (25 mg total) by mouth daily. 90 tablet 1   empagliflozin (JARDIANCE) 25 MG TABS tablet Take 1 tablet (25 mg total) by mouth daily. 90 tablet 1   fluconazole (DIFLUCAN) 150 MG tablet Take 2 tablets (300 mg total) by mouth once a week. 24 tablet 0   icosapent Ethyl (VASCEPA) 1 g capsule TAKE 2 CAPSULES BY MOUTH 2 TIMES DAILY. 360 capsule 1   levothyroxine (SYNTHROID) 137 MCG tablet Take 137 mcg by mouth daily before breakfast.     pravastatin (PRAVACHOL) 10 MG tablet TAKE 1 TABLET BY MOUTH ONCE A DAY 90 tablet 1   sertraline (ZOLOFT) 100 MG tablet TAKE 2 TABLETS BY MOUTH DAILY 90 tablet 0   SYNTHROID 137 MCG tablet Take 1 tablet by mouth daily. 90 tablet 1   benzonatate (TESSALON) 200 MG capsule Take 1 capsule  (200 mg total) by mouth 2 (two) times daily as needed for cough. 20 capsule 0   buPROPion (WELLBUTRIN XL) 150 MG 24 hr tablet Take 1 tablet (150 mg total) by mouth daily. 90 tablet 1   ezetimibe (ZETIA) 10 MG tablet TAKE 1 TABLET BY MOUTH DAILY. 90 tablet 1   omeprazole (PRILOSEC) 40 MG capsule TAKE 2 CAPSULES BY MOUTH ONCE A DAY. 180 capsule 1   pioglitazone (ACTOS) 15 MG tablet TAKE 1 TABLET BY MOUTH DAILY 90 tablet 1   potassium chloride SA (KLOR-CON) 20 MEQ tablet TAKE 1 TABLET BY MOUTH 2 TIMES DAILY. 180 tablet 0   Semaglutide (RYBELSUS) 7 MG TABS Take 1 tablet (7 mg total) by mouth daily. 30 tablet 5   Blood Glucose Monitoring Suppl (ACCU-CHEK GUIDE ME) w/Device KIT 1 Act by Does not apply route 3 (three) times daily as needed. 1 kit 0   empagliflozin (JARDIANCE) 25 MG TABS tablet Take 1 tablet (25 mg total) by mouth daily. 90 tablet 1   No facility-administered medications prior to visit.    ROS Review of Systems  Constitutional:  Positive for fatigue. Negative for chills, diaphoresis and unexpected weight change.  HENT: Negative.  Negative for sore throat and trouble swallowing.   Eyes: Negative.   Respiratory:  Negative for cough, chest tightness, shortness of breath and wheezing.   Cardiovascular:  Positive for chest pain. Negative for palpitations and leg swelling.  Gastrointestinal: Negative.  Negative for abdominal pain, constipation, diarrhea, nausea and vomiting.  Genitourinary:  Negative for difficulty urinating, dysuria and hematuria.  Musculoskeletal:  Positive for arthralgias. Negative for myalgias.  Skin: Negative.   Neurological: Negative.  Negative for dizziness, weakness and light-headedness.  Hematological:  Negative for adenopathy. Does not bruise/bleed easily.  Psychiatric/Behavioral: Negative.     Objective:  BP 124/66 (BP Location: Left Arm, Patient Position: Sitting, Cuff Size: Large)   Pulse 70   Temp 98 F (36.7 C) (Oral)   Ht _0  (1.702 m)   Wt 172  lb (78 kg)   SpO2 97%   BMI 26.94 kg/m   BP Readings from Last 3 Encounters:  11/02/20 124/66  07/15/20 138/82  04/29/20 122/76    Wt Readings from Last 3 Encounters:  11/02/20 172 lb (78 kg)  07/15/20 167 lb (75.8 kg)  04/29/20 164 lb (74.4 kg)    Physical Exam Vitals reviewed.  Constitutional:      Appearance: Normal appearance.  HENT:     Nose: Nose normal.     Mouth/Throat:     Mouth: Mucous membranes are moist.  Eyes:     General: No scleral icterus.    Conjunctiva/sclera: Conjunctivae normal.  Cardiovascular:     Rate and Rhythm: Normal rate and regular rhythm.     Heart sounds: Normal heart sounds, S1 normal and S2 normal.    No friction rub. No gallop.     Comments: EKG- NSR, 60 bpm No LVH or Q waves Pulmonary:     Breath sounds: No stridor. No wheezing, rhonchi or rales.  Abdominal:     General: Abdomen is flat.     Palpations: There is no mass.     Tenderness: There is no abdominal tenderness. There is no guarding.     Hernia: No hernia is present.  Musculoskeletal:     Right lower leg: No edema.     Left lower leg: No edema.  Skin:    General: Skin is warm and dry.     Coloration: Skin is not jaundiced.     Findings: No rash.  Neurological:     General: No focal deficit present.     Mental Status: She is alert.  Psychiatric:        Mood and Affect: Mood normal.        Behavior: Behavior normal.    Lab Results  Component Value Date   WBC 6.6 11/02/2020   HGB 14.4 11/02/2020   HCT 43.3 11/02/2020   PLT 140.0 (L) 11/02/2020   GLUCOSE 125 (H) 11/02/2020   CHOL 165 12/23/2019   TRIG 113.0 12/23/2019   HDL 35.20 (L) 12/23/2019   LDLDIRECT 131.0 01/27/2019   LDLCALC 107 (H) 12/23/2019   ALT 28 07/15/2020   AST 27 07/15/2020   NA 138 11/02/2020   K 4.4 11/02/2020   CL 104 11/02/2020   CREATININE 0.77 11/02/2020   BUN 13 11/02/2020   CO2 26 11/02/2020   TSH 1.68 07/15/2020   INR 1.0 01/27/2019   HGBA1C 7.1 (H) 07/15/2020   MICROALBUR  <0.7 07/15/2020    DG Chest 2 View  Result Date: 06/07/2020 CLINICAL DATA:  Cough, history of COVID-19, 3 weeks of cough and chest congestion, recent home COVID test positive EXAM: CHEST - 2 VIEW COMPARISON:  05/14/2019 FINDINGS: Normal heart size, mediastinal contours, and pulmonary vascularity. Mild chronic bronchitic changes. Lungs otherwise clear. No pleural effusion or pneumothorax. Minimal biconvex thoracolumbar scoliosis with scattered degenerative  disc disease changes. IMPRESSION: Mild chronic bronchitic changes without infiltrate. Electronically Signed   By: Lavonia Dana M.D.   On: 06/07/2020 09:24    Assessment & Plan:   Tasha [name] was seen today for diabetes and hypothyroidism.  Diagnoses and all orders for this visit:  Hypertension, unspecified type- Her blood pressure is adequately well controlled. -     Basic metabolic panel; Future -     Basic metabolic panel  Severe episode of recurrent major depressive disorder, with psychotic features ([place]) -     buPROPion (WELLBUTRIN XL) 150 MG 24 hr tablet; Take 1 tablet by mouth daily.  Hyperlipidemia with target LDL less than 130 -     ezetimibe (ZETIA) 10 MG tablet; TAKE 1 TABLET BY MOUTH DAILY.  Gastroesophageal reflux disease without esophagitis -     omeprazole (PRILOSEC) 40 MG capsule; TAKE 2 CAPSULES BY MOUTH ONCE A DAY.  Type II diabetes mellitus with manifestations ([place])- Will increase the dose of the GLP-1 agonist. -     pioglitazone (ACTOS) 15 MG tablet; TAKE 1 TABLET BY MOUTH DAILY -     Semaglutide (RYBELSUS) 14 MG TABS; Take 1 tablet (14 mg) by mouth daily.  Nonalcoholic steatohepatitis ([place]) -     pioglitazone (ACTOS) 15 MG tablet; TAKE 1 TABLET BY MOUTH DAILY  Acute hypokalemia -     potassium chloride SA (KLOR-CON) 20 MEQ tablet; TAKE 1 TABLET BY MOUTH 2 TIMES DAILY.  Acquired hypothyroidism  Thrombocytopenia (HCC)-she has had no bleeding or bruising.  We will continue to monitor this. -     CBC with  Differential/Platelet; Future -     CBC with Differential/Platelet  Visit for screening mammogram -     MM DIGITAL SCREENING BILATERAL; Future  Tobacco abuse -     Ambulatory Referral for Lung Cancer Scre  Chest heaviness- Based on her symptoms, EKG, and lab work I do not think she has cardiopulmonary disease.  I have recommended that she undergo a low-dose CT scan to screen for lung cancer. -     CBC with Differential/Platelet; Future -     Brain natriuretic peptide; Future -     Troponin I (High Sensitivity); Future -     D-dimer, quantitative; Future -     EKG 12-Lead -     D-dimer, quantitative -     Troponin I (High Sensitivity) -     Brain natriuretic peptide -     CBC with Differential/Platelet  Need for vaccination -     Pneumococcal conjugate vaccine 20-valent  Need for shingles vaccine -     Zoster Vaccine Adjuvanted [facility]) injection; Inject 0.5 mLs into the muscle once for 1 dose.  Other orders -     Flu Vaccine QUAD 6+ mos PF IM (Fluarix Quad PF)  I have discontinued Tasha L. Camacho "Kim"'s Accu-Chek Guide Me, benzonatate, and Rybelsus. I have also changed her buPROPion. Additionally, I am having her start on Shingrix and Rybelsus. Lastly, I am having her maintain her pravastatin, icosapent Ethyl, ALPRAZolam, fluconazole, levothyroxine, albuterol, empagliflozin, Jardiance, sertraline, Synthroid, ezetimibe, omeprazole, pioglitazone, and potassium chloride SA.  Meds ordered this encounter  Medications   buPROPion (WELLBUTRIN XL) 150 MG 24 hr tablet    Sig: Take 1 tablet by mouth daily.    Dispense:  90 tablet    Refill:  1   ezetimibe (ZETIA) 10 MG tablet    Sig: TAKE 1 TABLET BY MOUTH DAILY.    Dispense:  90 tablet  Refill:  1   omeprazole (PRILOSEC) 40 MG capsule    Sig: TAKE 2 CAPSULES BY MOUTH ONCE A DAY.    Dispense:  180 capsule    Refill:  1   pioglitazone (ACTOS) 15 MG tablet    Sig: TAKE 1 TABLET BY MOUTH DAILY    Dispense:  90 tablet     Refill:  1   potassium chloride SA (KLOR-CON) 20 MEQ tablet    Sig: TAKE 1 TABLET BY MOUTH 2 TIMES DAILY.    Dispense:  180 tablet    Refill:  0   Zoster Vaccine Adjuvanted [facility]) injection    Sig: Inject 0.5 mLs into the muscle once for 1 dose.    Dispense:  0.5 mL    Refill:  1   Semaglutide (RYBELSUS) 14 MG TABS    Sig: Take 1 tablet (14 mg) by mouth daily.    Dispense:  90 tablet    Refill:  0      Follow-up: Return in about 3 weeks (around 11/23/2020).  [name], MD

## 2020-11-03 DIAGNOSIS — K219 Gastro-esophageal reflux disease without esophagitis: Secondary | ICD-10-CM | POA: Diagnosis not present

## 2020-11-03 DIAGNOSIS — E118 Type 2 diabetes mellitus with unspecified complications: Secondary | ICD-10-CM | POA: Diagnosis not present

## 2020-11-03 DIAGNOSIS — R0789 Other chest pain: Secondary | ICD-10-CM | POA: Diagnosis not present

## 2020-11-03 DIAGNOSIS — Z23 Encounter for immunization: Secondary | ICD-10-CM | POA: Diagnosis not present

## 2020-11-03 DIAGNOSIS — Z720 Tobacco use: Secondary | ICD-10-CM | POA: Diagnosis not present

## 2020-11-03 DIAGNOSIS — D696 Thrombocytopenia, unspecified: Secondary | ICD-10-CM | POA: Diagnosis not present

## 2020-11-03 DIAGNOSIS — K7581 Nonalcoholic steatohepatitis (NASH): Secondary | ICD-10-CM | POA: Diagnosis not present

## 2020-11-03 DIAGNOSIS — E876 Hypokalemia: Secondary | ICD-10-CM | POA: Diagnosis not present

## 2020-11-03 LAB — D-DIMER, QUANTITATIVE: D-Dimer, Quant: 0.45 mcg/mL FEU (ref <0.50)

## 2020-11-04 ENCOUNTER — Telehealth: Payer: Self-pay | Admitting: Pharmacist

## 2020-11-04 NOTE — Telephone Encounter (Signed)
Daughter Janett Billow returned call, she reports her mother was taking Rybelsus 3 mg prior to seeing Dr Ronnald Ramp and since A1c was not at goal the plan was to skip 7 mg and go right to 14 mg dose. Notably, pt was on 7 mg dose in 2021 and eventually stopped it due to side effects. I recommend titrating more slowly to give her best chance of tolerating the medication.  Pt will bring [employer] pt assistance paperwork to office 9/30. We will apply for Rybelsus 7 mg.   Will provide pt with Rybelsus 3 mg samples x 1 box so she will have something while we await pt assistance approval.

## 2020-11-04 NOTE — Telephone Encounter (Signed)
Patient's daughter Tasha [name] called to discuss Rybelsus assistance.  She reports patient is in donut hole and Rybelsus copay was over $700 at the pharmacy. It looks like patient recently restarted Rybelsus after 09/17/20 office visit [facility] and Vania Rea was discontinued at that time. The plan was to start with 3 mg and increase to 7 mg after a month. It is not clear if patient started Rybelsus as prescribed, there are no recent fills in Surescripts.  Per recent PCP visit (9/27) the plan was to increase Rybelsus to 14 mg and continue Jardiance 25 mg. Pt's daughter is not sure if she was taking Rybelsus 7 mg at all prior to that visit.  Patient's daughter will find out what dose of Rybelsus (if any) she has been taking and let me know. She has completed paperwork for [employer] pt assistance and will be bringing this by the office shortly. She reports household income is around $62,000 so she should qualify.

## 2020-11-05 NOTE — Telephone Encounter (Signed)
Left Rybelusus 3 mg up front for pick-up. Lot # [id number] Exp 10/2021.Marland KitchenJohny Chess

## 2020-11-06 ENCOUNTER — Other Ambulatory Visit (HOSPITAL_COMMUNITY): Payer: Self-pay

## 2020-11-08 ENCOUNTER — Other Ambulatory Visit: Payer: Self-pay

## 2020-11-08 NOTE — Patient Outreach (Signed)
[facility]) Care Management  11/08/2020  Tasha [name] August 07, 1960 [id number]   Telephone call to patient for follow up. Patient reports feeling some better.  She reports an adjustment in her thyroid medication and she does not feel as hyped.  Advised that CM will now transfer to health coach for continued disease management as she now has [facility].    Plan: RN CM will refer to health coach for continued disease management.  Jone Baseman, RN, MSN [place] Management Care Management Coordinator Direct Line [phone] Cell [phone] Toll Free: [phone]  Fax: [phone]

## 2020-11-10 ENCOUNTER — Other Ambulatory Visit (HOSPITAL_COMMUNITY): Payer: Self-pay

## 2020-11-10 ENCOUNTER — Other Ambulatory Visit: Payer: Self-pay | Admitting: *Deleted

## 2020-11-16 ENCOUNTER — Encounter: Payer: Self-pay | Admitting: Internal Medicine

## 2020-11-16 ENCOUNTER — Other Ambulatory Visit (HOSPITAL_COMMUNITY): Payer: Self-pay

## 2020-11-16 ENCOUNTER — Other Ambulatory Visit: Payer: Self-pay | Admitting: Internal Medicine

## 2020-11-16 DIAGNOSIS — F418 Other specified anxiety disorders: Secondary | ICD-10-CM

## 2020-11-16 MED ORDER — ALPRAZOLAM 1 MG PO TABS
ORAL_TABLET | Freq: Three times a day (TID) | ORAL | 5 refills | Status: DC | PRN
  Filled 2020-11-16: qty 90, 30d supply, fill #0
  Filled 2020-12-24: qty 90, 30d supply, fill #1
  Filled 2021-01-28: qty 90, 30d supply, fill #2
  Filled 2021-02-14 – 2021-02-26 (×2): qty 90, 30d supply, fill #3
  Filled 2021-04-01: qty 90, 30d supply, fill #4
  Filled 2021-04-29: qty 90, 30d supply, fill #5

## 2020-11-16 MED FILL — Icosapent Ethyl Cap 1 GM: ORAL | 30 days supply | Qty: 120 | Fill #2 | Status: CN

## 2020-11-17 ENCOUNTER — Other Ambulatory Visit (HOSPITAL_COMMUNITY): Payer: Self-pay

## 2020-11-22 ENCOUNTER — Telehealth: Payer: Self-pay | Admitting: Pharmacist

## 2020-11-22 MED ORDER — RYBELSUS 7 MG PO TABS: 7.0000 mg | ORAL_TABLET | Freq: Every day | ORAL | 0 refills | Status: AC

## 2020-11-22 NOTE — Telephone Encounter (Signed)
Received [employer] application from patient. Ordered Rybelsus 7 mg x 1 box and Rybelsus 14 mg x 1 box. Faxed application to [employer].

## 2020-11-29 NOTE — Telephone Encounter (Signed)
Contacted [employer] to check status of Rybelsus application. Representative processed application while on the phone. Tasha [name] has been approved 11/29/20 through 02/05/21. They will be shipping Rybelsus 7 mg x 30 ds and Rybelsus 14 mg x 30 ds.   She will need to renew enrollment for 2023 prior to 02/05/21.

## 2020-12-16 NOTE — Telephone Encounter (Signed)
I spoke with patient yesterday about her Rybelsus order. This was approved 10/24 and takes 10-14 business days for shipment, today (11/10) is day 14. Per [employer] they are experiencing nationwide shipping delays so it may take longer. Informed patient of all of this yesterday and she voiced understanding.

## 2020-12-16 NOTE — Telephone Encounter (Signed)
Patient requesting a call back to discuss order for rybelsus  Patient requesting a note in her chart that she has called to check the status of the medication.

## 2020-12-21 ENCOUNTER — Other Ambulatory Visit: Payer: Self-pay

## 2020-12-21 ENCOUNTER — Telehealth: Payer: Self-pay

## 2020-12-21 DIAGNOSIS — F17210 Nicotine dependence, cigarettes, uncomplicated: Secondary | ICD-10-CM

## 2020-12-21 DIAGNOSIS — Z87891 Personal history of nicotine dependence: Secondary | ICD-10-CM

## 2020-12-21 NOTE — Progress Notes (Signed)
    Chronic Care Management Pharmacy Assistant   Name: ELISE KNOBLOCH  MRN: [id number] DOB: November 09, 1960  Patient is currently enrolled in [place] patient assistance program for the medication Rybelsus until date: 02/05/21 Patient would like to re-enroll in patient assistance for the next calendar year.  Renewal forms were completed on behalf of the patient. Patient has requested to sign forms in the office. Forms will be printed and placed at front desk. Patient would like to know if she could have samples until her medication comes if any are available.  [place] Pharmacist Assistant [phone]

## 2020-12-21 NOTE — Telephone Encounter (Signed)
Patient states she has not received her Rybelsus medication.  Informed patient per phone call on 11-10, shipment takes 10-14 business days, Patient denies receiving shipment information, patient states medication should have arrived last week.  Patient is requesting a call back

## 2020-12-24 ENCOUNTER — Other Ambulatory Visit (HOSPITAL_COMMUNITY): Payer: Self-pay

## 2020-12-24 ENCOUNTER — Other Ambulatory Visit: Payer: Self-pay | Admitting: Internal Medicine

## 2020-12-24 DIAGNOSIS — F418 Other specified anxiety disorders: Secondary | ICD-10-CM

## 2020-12-24 MED ORDER — SERTRALINE HCL 100 MG PO TABS
ORAL_TABLET | Freq: Every day | ORAL | 0 refills | Status: DC
  Filled 2020-12-24: qty 90, 45d supply, fill #0

## 2020-12-24 NOTE — Telephone Encounter (Signed)
Patient calling to check status of refill request  Advised patient request can take up to 3bds

## 2020-12-25 ENCOUNTER — Other Ambulatory Visit (HOSPITAL_COMMUNITY): Payer: Self-pay

## 2020-12-28 ENCOUNTER — Other Ambulatory Visit (HOSPITAL_COMMUNITY): Payer: Self-pay

## 2021-01-04 ENCOUNTER — Other Ambulatory Visit (HOSPITAL_COMMUNITY): Payer: Self-pay

## 2021-01-04 NOTE — Telephone Encounter (Signed)
Patient calling to check status of medication, patient states she has not received the medication  *see below*

## 2021-01-10 ENCOUNTER — Other Ambulatory Visit: Payer: Self-pay | Admitting: *Deleted

## 2021-01-10 NOTE — Patient Outreach (Signed)
[facility]) Care Management  01/10/2021  MERDITH ADAN 30-Nov-1960 [id number]   RN Health Coach attempted follow up outreach call to patient.  Patient was unavailable. HIPPA compliance voicemail message left with return callback number.  Plan: RN will call patient again within 30 days.  [place] Care Management [phone]

## 2021-01-20 ENCOUNTER — Telehealth (INDEPENDENT_AMBULATORY_CARE_PROVIDER_SITE_OTHER): Payer: Medicare Other | Admitting: Acute Care

## 2021-01-20 ENCOUNTER — Other Ambulatory Visit: Payer: Self-pay

## 2021-01-20 ENCOUNTER — Encounter: Payer: Self-pay | Admitting: Acute Care

## 2021-01-20 DIAGNOSIS — F17210 Nicotine dependence, cigarettes, uncomplicated: Secondary | ICD-10-CM | POA: Diagnosis not present

## 2021-01-20 DIAGNOSIS — G720 Drug-induced myopathy: Secondary | ICD-10-CM

## 2021-01-20 NOTE — Patient Instructions (Signed)
Thank you for participating in the [place] Lung Cancer Screening Program. °It was our pleasure to meet you today. °We will call you with the results of your scan within the next few days. °Your scan will be assigned a Lung RADS category score by the physicians reading the scans.  °This Lung RADS score determines follow up scanning.  °See below for description of categories, and follow up screening recommendations. °We will be in touch to schedule your follow up screening annually or based on recommendations of our providers. °We will fax a copy of your scan results to your Primary Care Physician, or the physician who referred you to the program, to ensure they have the results. °Please call the office if you have any questions or concerns regarding your scanning experience or results.  °Our office number is [phone]. °Please speak with Denise Phelps, RN. She is our Lung Cancer Screening RN. °If she is unavailable when you call, please have the office staff send her a message. She will return your call at her earliest convenience. °Remember, if your scan is normal, we will scan you annually as long as you continue to meet the criteria for the program. (Age 55-77, Current smoker or smoker who has quit within the last 15 years). °If you are a smoker, remember, quitting is the single most powerful action that you can take to decrease your risk of lung cancer and other pulmonary, breathing related problems. °We know quitting is hard, and we are here to help.  °Please let us know if there is anything we can do to help you meet your goal of quitting. °If you are a former smoker, congratulations. We are proud of you! Remain smoke free! °Remember you can refer friends or family members through the number above.  °We will screen them to make sure they meet criteria for the program. °Thank you for helping us take better care of you by participating in Lung Screening. ° °You can receive free nicotine replacement therapy  ( patches, gum or mints) by calling 1-800-QUIT NOW. Please call so we can get you on the path to becoming  a non-smoker. I know it is hard, but you can do this! ° °Lung RADS Categories: ° °Lung RADS 1: no nodules or definitely non-concerning nodules.  °Recommendation is for a repeat annual scan in 12 months. ° °Lung RADS 2:  nodules that are non-concerning in appearance and behavior with a very low likelihood of becoming an active cancer. °Recommendation is for a repeat annual scan in 12 months. ° °Lung RADS 3: nodules that are probably non-concerning , includes nodules with a low likelihood of becoming an active cancer.  Recommendation is for a 6-month repeat screening scan. Often noted after an upper respiratory illness. We will be in touch to make sure you have no questions, and to schedule your 6-month scan. ° °Lung RADS 4 A: nodules with concerning findings, recommendation is most often for a follow up scan in 3 months or additional testing based on our provider's assessment of the scan. We will be in touch to make sure you have no questions and to schedule the recommended 3 month follow up scan. ° °Lung RADS 4 B:  indicates findings that are concerning. We will be in touch with you to schedule additional diagnostic testing based on our provider's  assessment of the scan. ° °Hypnosis for smoking cessation  °[employer] °[phone] ° °Acupuncture for smoking cessation  °[facility] °[phone]  °

## 2021-01-20 NOTE — Progress Notes (Signed)
Virtual Visit via Telephone Note  I connected with Tasha [name] on 12/21/20 at  2:00 PM EST by telephone and verified that I am speaking with the correct person using two identifiers.  Location: Patient: Home Provider: Working form home   I discussed the limitations, risks, security and privacy concerns of performing an evaluation and management service by telephone and the availability of in person appointments. I also discussed with the patient that there may be a patient responsible charge related to this service. The patient expressed understanding and agreed to proceed.  Shared Decision Making Visit Lung Cancer Screening Program [phone])   Eligibility: Age 60 y.o. Pack Years Smoking History Calculation 54 (# packs/per year x # years smoked) Recent History of coughing up blood  no Unexplained weight loss? no ( >Than 15 pounds within the last 6 months ) Prior History Lung / other cancer no (Diagnosis within the last 5 years already requiring surveillance chest CT Scans). Smoking Status Current Smoker Former Smokers: Years since quit: NA  Quit Date: NA  Visit Components: Discussion included one or more decision making aids. yes Discussion included risk/benefits of screening. yes Discussion included potential follow up diagnostic testing for abnormal scans. yes Discussion included meaning and risk of over diagnosis. yes Discussion included meaning and risk of False Positives. yes Discussion included meaning of total radiation exposure. yes  Counseling Included: Importance of adherence to annual lung cancer LDCT screening. yes Impact of comorbidities on ability to participate in the program. yes Ability and willingness to under diagnostic treatment. yes  Smoking Cessation Counseling: Current Smokers:  Discussed importance of smoking cessation. yes Information about tobacco cessation classes and interventions provided to patient. yes Patient provided with "ticket" for  LDCT Scan. yes Symptomatic Patient. yes  Counseling(Intermediate counseling: > three minutes) [address]406 Diagnosis Code: Tobacco Use Z72.0 Asymptomatic Patient NA  Counseling NA Former Smokers:  Discussed the importance of maintaining cigarette abstinence. yes Diagnosis Code: Personal History of Nicotine Dependence. W29.937 Information about tobacco cessation classes and interventions provided to patient. Yes Patient provided with "ticket" for LDCT Scan. yes Written Order for Lung Cancer Screening with LDCT placed in Epic. Yes (CT Chest Lung Cancer Screening Low Dose W/O CM) [id number] Z12.2-Screening of respiratory organs Z87.891-Personal history of nicotine dependence   I spent 25 minutes of face to face time with she discussing the risks and benefits of lung cancer screening. We viewed a power point together that explained in detail the above noted topics. We took the time to pause the power point at intervals to allow for questions to be asked and answered to ensure understanding. We discussed that she had taken the single most powerful action possible to decrease her risk of developing lung cancer when she quit smoking. I counseled her to remain smoke free, and to contact me if she ever had the desire to smoke again so that I can provide resources and tools to help support the effort to remain smoke free. We discussed the time and location of the scan, and that either  Tasha Glassman RN or I will call with the results within  24-48 hours of receiving them. She has my card and contact information in the event she needs to speak with me, in addition to a copy of the power point we reviewed as a resource. She verbalized understanding of all of the above and had no further questions upon leaving the office.    I explained to the patient that there has been a high  incidence of coronary artery disease noted on these exams. I explained that this is a non-gated exam therefore degree or severity cannot be  determined. This patient is not on statin therapy. I have asked the patient to follow-up with their PCP regarding any incidental finding of coronary artery disease and management with diet or medication as they feel is clinically indicated. The patient verbalized understanding of the above and had no further questions.   Karisa Nesser D. Kenton Kingfisher, NP-C [facility] Personal contact information can be found on Amion  01/20/2021, 11:04 AM

## 2021-01-21 ENCOUNTER — Ambulatory Visit
Admission: RE | Admit: 2021-01-21 | Discharge: 2021-01-21 | Disposition: A | Discharge status: Home / Self Care | Payer: Medicare Other | Source: Ambulatory Visit | Attending: Acute Care | Admitting: Acute Care

## 2021-01-21 DIAGNOSIS — Z87891 Personal history of nicotine dependence: Secondary | ICD-10-CM

## 2021-01-21 DIAGNOSIS — I2510 Atherosclerotic heart disease of native coronary artery without angina pectoris: Secondary | ICD-10-CM | POA: Diagnosis not present

## 2021-01-21 DIAGNOSIS — F17210 Nicotine dependence, cigarettes, uncomplicated: Secondary | ICD-10-CM

## 2021-01-21 DIAGNOSIS — I700 Atherosclerosis of aorta: Secondary | ICD-10-CM | POA: Diagnosis not present

## 2021-01-28 ENCOUNTER — Other Ambulatory Visit: Payer: Self-pay | Admitting: Acute Care

## 2021-01-28 ENCOUNTER — Other Ambulatory Visit (HOSPITAL_COMMUNITY): Payer: Self-pay

## 2021-01-28 DIAGNOSIS — F17210 Nicotine dependence, cigarettes, uncomplicated: Secondary | ICD-10-CM

## 2021-01-28 DIAGNOSIS — Z87891 Personal history of nicotine dependence: Secondary | ICD-10-CM

## 2021-02-08 ENCOUNTER — Other Ambulatory Visit: Payer: Self-pay | Admitting: Podiatry

## 2021-02-08 ENCOUNTER — Ambulatory Visit (INDEPENDENT_AMBULATORY_CARE_PROVIDER_SITE_OTHER): Payer: Self-pay | Admitting: Podiatry

## 2021-02-08 ENCOUNTER — Ambulatory Visit: Payer: Self-pay

## 2021-02-08 ENCOUNTER — Other Ambulatory Visit: Payer: Self-pay

## 2021-02-08 ENCOUNTER — Encounter: Payer: Self-pay | Admitting: Podiatry

## 2021-02-08 ENCOUNTER — Other Ambulatory Visit (HOSPITAL_COMMUNITY): Payer: Self-pay

## 2021-02-08 DIAGNOSIS — S99921A Unspecified injury of right foot, initial encounter: Secondary | ICD-10-CM

## 2021-02-08 MED ORDER — MELOXICAM 15 MG PO TABS
15.0000 mg | ORAL_TABLET | Freq: Every day | ORAL | 0 refills | Status: DC
  Filled 2021-02-08: qty 30, 30d supply, fill #0

## 2021-02-08 NOTE — Addendum Note (Signed)
Addended by: Ruthell Rummage on: 02/08/2021 04:51 PM   Modules accepted: Orders

## 2021-02-08 NOTE — Progress Notes (Signed)
°  Subjective:  Patient ID: Tasha [name], female    DOB: 10/20/1960,   MRN: [id number]  Chief Complaint  Patient presents with   Foot Injury    Right foot injury    61 y.o. female presents for right foot injury. Relates on 01/28/21 she was trying on boots and slipped and fell and had immediate pain in the foot. Relates constant pain and throbbing pain . Denies any other pedal complaints. Denies n/v/f/c.   Past Medical History:  Diagnosis Date   Allergy    Anemia    past hx    Anxiety state, unspecified    Arthralgia of temporomandibular joint    Arthritis    Asthma    as a child , not now per pt    Conversion disorder    Dementia ([place])    Depressive disorder, not elsewhere classified    Diabetes mellitus without complication ([place])    off all medicines as of 04-16-2019 PV    Dysphagia, unspecified(787.20)    Endometriosis    Esophageal reflux    Fibromyalgia    Headache(784.0)    Heart murmur    past hx murmur    Hx of adenomatous colonic polyps    Hypertension 07/18/2020   Irritable bowel syndrome    Mitral valve disorders(424.0)    Myalgia and myositis, unspecified    Overweight(278.02)    Periodic limb movement disorder    Pure hypercholesterolemia    RLS (restless legs syndrome)    Seizures ([place])    last episode 5 yrs ago    Stroke [facility])    TIA per pt many years ago    Unspecified hypothyroidism     Objective:  Physical Exam: Vascular: DP/PT pulses 2/4 bilateral. CFT <3 seconds. Normal hair growth on digits. No edema.  Skin. No lacerations or abrasions bilateral feet.  Musculoskeletal: MMT 5/5 bilateral lower extremities in DF, PF, Inversion and Eversion. Deceased ROM in DF of ankle joint. Tenderness through dorsal midfoot and plantar midfoot area. No pain with movement of 1-5 rays. No pain with PF, DF inversion or eversion.  Neurological: Sensation intact to light touch.   Assessment:   1. Injury of right foot, initial encounter      Plan:  Patient  was evaluated and treated and all questions answered. -Xrays reviewed. No acute fractures or dislocations noted. Degenerative changes noted throughout the midfoot.  Discussed soft tissue contusion and midfoot arthritis with patient and treatment options.   Discussed NSAIDS, topicals, and possible injections.  Prescription for meloxicam provided. Discussed stiff soled shoes and carbon fiber foot plate.  Discussed if pain does not improve can discuss surgical options.  Patient to follow-up as needed.     Lorenda Peck, DPM

## 2021-02-11 ENCOUNTER — Other Ambulatory Visit: Payer: Self-pay | Admitting: *Deleted

## 2021-02-11 NOTE — Patient Outreach (Signed)
[facility]) Care Management  02/11/2021  KATHYANN SPAUGH 02-Dec-1960 [id number]   RN Health Coach attempted follow up outreach call to patient.  Patient was unavailable. HIPPA compliance voicemail message left with return callback number.  Plan: RN will call patient again within 30 days.  [place] Care Management [phone]

## 2021-02-14 ENCOUNTER — Ambulatory Visit (INDEPENDENT_AMBULATORY_CARE_PROVIDER_SITE_OTHER): Payer: No Typology Code available for payment source

## 2021-02-14 ENCOUNTER — Other Ambulatory Visit (HOSPITAL_COMMUNITY): Payer: Self-pay

## 2021-02-14 ENCOUNTER — Encounter: Payer: Self-pay | Admitting: Internal Medicine

## 2021-02-14 ENCOUNTER — Telehealth: Payer: Self-pay

## 2021-02-14 ENCOUNTER — Ambulatory Visit (INDEPENDENT_AMBULATORY_CARE_PROVIDER_SITE_OTHER): Payer: No Typology Code available for payment source | Admitting: Internal Medicine

## 2021-02-14 ENCOUNTER — Other Ambulatory Visit: Payer: Self-pay

## 2021-02-14 VITALS — BP 124/80 | HR 72 | Temp 98.5°F | Ht 67.0 in | Wt 174.0 lb

## 2021-02-14 DIAGNOSIS — R933 Abnormal findings on diagnostic imaging of other parts of digestive tract: Secondary | ICD-10-CM | POA: Insufficient documentation

## 2021-02-14 DIAGNOSIS — K5904 Chronic idiopathic constipation: Secondary | ICD-10-CM

## 2021-02-14 DIAGNOSIS — E559 Vitamin D deficiency, unspecified: Secondary | ICD-10-CM

## 2021-02-14 DIAGNOSIS — R10814 Left lower quadrant abdominal tenderness: Secondary | ICD-10-CM

## 2021-02-14 DIAGNOSIS — K2100 Gastro-esophageal reflux disease with esophagitis, without bleeding: Secondary | ICD-10-CM | POA: Diagnosis not present

## 2021-02-14 DIAGNOSIS — E781 Pure hyperglyceridemia: Secondary | ICD-10-CM | POA: Diagnosis not present

## 2021-02-14 DIAGNOSIS — R140 Abdominal distension (gaseous): Secondary | ICD-10-CM | POA: Diagnosis not present

## 2021-02-14 DIAGNOSIS — E118 Type 2 diabetes mellitus with unspecified complications: Secondary | ICD-10-CM | POA: Diagnosis not present

## 2021-02-14 DIAGNOSIS — Z23 Encounter for immunization: Secondary | ICD-10-CM | POA: Insufficient documentation

## 2021-02-14 DIAGNOSIS — I70219 Atherosclerosis of native arteries of extremities with intermittent claudication, unspecified extremity: Secondary | ICD-10-CM

## 2021-02-14 DIAGNOSIS — I10 Essential (primary) hypertension: Secondary | ICD-10-CM

## 2021-02-14 DIAGNOSIS — R1032 Left lower quadrant pain: Secondary | ICD-10-CM | POA: Diagnosis not present

## 2021-02-14 DIAGNOSIS — E785 Hyperlipidemia, unspecified: Secondary | ICD-10-CM | POA: Diagnosis not present

## 2021-02-14 DIAGNOSIS — Z0001 Encounter for general adult medical examination with abnormal findings: Secondary | ICD-10-CM

## 2021-02-14 DIAGNOSIS — E039 Hypothyroidism, unspecified: Secondary | ICD-10-CM | POA: Diagnosis not present

## 2021-02-14 LAB — BASIC METABOLIC PANEL
BUN: 11 mg/dL (ref 6–23)
CO2: 28 mEq/L (ref 19–32)
Calcium: 9.5 mg/dL (ref 8.4–10.5)
Chloride: 104 mEq/L (ref 96–112)
Creatinine, Ser: 0.79 mg/dL (ref 0.40–1.20)
GFR: 80.47 mL/min (ref >60.00)
Glucose, Bld: 119 mg/dL — ABNORMAL HIGH (ref 70–99)
Potassium: 3.7 mEq/L (ref 3.5–5.1)
Sodium: 139 mEq/L (ref 135–145)

## 2021-02-14 LAB — AMYLASE: Amylase: 23 U/L — ABNORMAL LOW (ref 27–131)

## 2021-02-14 LAB — CBC WITH DIFFERENTIAL/PLATELET
Basophils Absolute: 0 10*3/uL (ref 0.0–0.1)
Basophils Relative: 0.2 % (ref 0.0–3.0)
Eosinophils Absolute: 0 10*3/uL (ref 0.0–0.7)
Eosinophils Relative: 0.2 % (ref 0.0–5.0)
HCT: 42.9 % (ref 36.0–46.0)
Hemoglobin: 14.1 g/dL (ref 12.0–15.0)
Lymphocytes Relative: 41.5 % (ref 12.0–46.0)
Lymphs Abs: 3.4 10*3/uL (ref 0.7–4.0)
MCHC: 32.8 g/dL (ref 30.0–36.0)
MCV: 96.1 fl (ref 78.0–100.0)
Monocytes Absolute: 0.5 10*3/uL (ref 0.1–1.0)
Monocytes Relative: 5.6 % (ref 3.0–12.0)
Neutro Abs: 4.3 10*3/uL (ref 1.4–7.7)
Neutrophils Relative %: 52.5 % (ref 43.0–77.0)
Platelets: 166 10*3/uL (ref 150.0–400.0)
RBC: 4.47 Mil/uL (ref 3.87–5.11)
RDW: 12.9 % (ref 11.5–15.5)
WBC: 8.2 10*3/uL (ref 4.0–10.5)

## 2021-02-14 LAB — LIPID PANEL
Cholesterol: 213 mg/dL — ABNORMAL HIGH (ref 0–200)
HDL: 47.6 mg/dL (ref >39.00)
NonHDL: 165.77
Total CHOL/HDL Ratio: 4
Triglycerides: 219 mg/dL — ABNORMAL HIGH (ref 0.0–149.0)
VLDL: 43.8 mg/dL — ABNORMAL HIGH (ref 0.0–40.0)

## 2021-02-14 LAB — LDL CHOLESTEROL, DIRECT: Direct LDL: 140 mg/dL

## 2021-02-14 LAB — TSH: TSH: 1.07 u[IU]/mL (ref 0.35–5.50)

## 2021-02-14 LAB — MAGNESIUM: Magnesium: 1.9 mg/dL (ref 1.5–2.5)

## 2021-02-14 LAB — LIPASE: Lipase: 35 U/L (ref 11.0–59.0)

## 2021-02-14 LAB — HEMOGLOBIN A1C: Hgb A1c MFr Bld: 6.8 % — ABNORMAL HIGH (ref 4.6–6.5)

## 2021-02-14 MED ORDER — SHINGRIX 50 MCG/0.5ML IM SUSR
0.5000 mL | Freq: Once | INTRAMUSCULAR | 1 refills | Status: AC
  Filled 2021-02-14: qty 0.5, 1d supply, fill #0

## 2021-02-14 NOTE — Telephone Encounter (Signed)
Pt calling in stating that she has a bowel taste in her mouth started over the weekend. Stomach was swollen last week then the bowel taste start. Pt describes the feeling as being bloated really bad. It feeling like Acid reflux with it coming up her esophagus.   Pt sat up an appt to see Dr. Mitchel Honour 02/15/21.  Pt want recommendations from Dr. Ronnald Ramp.

## 2021-02-14 NOTE — Progress Notes (Signed)
Subjective:  Patient ID: Tasha [name], female    DOB: December 30, 1960  Age: 61 y.o. MRN: [id number]  CC: Abdominal Pain (1 week, bloating and belching) and Annual Exam  This visit occurred during the SARS-CoV-2 public health emergency.  Safety protocols were in place, including screening questions prior to the visit, additional usage of staff PPE, and extensive cleaning of exam room while observing appropriate contact time as indicated for disinfecting solutions.    HPI Tasha [name] presents for a CPX and f/up -   She complains of a 1 month history of constipation and abdominal bloating.  Over the last few days she is also had some left lower quadrant abdominal pain.  The constipation is responding well to a laxative.  She is stopped taking Rybelsus and Jardiance.  Her appetite is back to normal and she has gained weight.  She denies nausea, vomiting, diarrhea, melena, hematuria, chest pain, shortness of breath, or polys.  Outpatient Medications Prior to Visit  Medication Sig Dispense Refill   albuterol (VENTOLIN HFA) 108 (90 Base) MCG/ACT inhaler Inhale 2 puffs into the lungs every 6 (six) hours as needed for wheezing or shortness of breath. 8.5 g 0   ALPRAZolam (XANAX) 1 MG tablet TAKE 1 TABLET BY MOUTH 3 TIMES DAILY AS NEEDED FOR ANXIETY. 90 tablet 5   buPROPion (WELLBUTRIN XL) 150 MG 24 hr tablet Take 1 tablet by mouth daily. 90 tablet 1   ezetimibe (ZETIA) 10 MG tablet TAKE 1 TABLET BY MOUTH DAILY. 90 tablet 1   icosapent Ethyl (VASCEPA) 1 g capsule TAKE 2 CAPSULES BY MOUTH 2 TIMES DAILY. 360 capsule 1   meloxicam (MOBIC) 15 MG tablet Take 1 tablet (15 mg total) by mouth daily. 30 tablet 0   omeprazole (PRILOSEC) 40 MG capsule TAKE 2 CAPSULES BY MOUTH ONCE A DAY. 180 capsule 1   pioglitazone (ACTOS) 15 MG tablet TAKE 1 TABLET BY MOUTH DAILY 90 tablet 1   potassium chloride SA (KLOR-CON M) 20 MEQ tablet TAKE 1 TABLET BY MOUTH 2 TIMES DAILY. 180 tablet 0   sertraline (ZOLOFT) 100 MG  tablet TAKE 2 TABLETS BY MOUTH DAILY 90 tablet 0   pravastatin (PRAVACHOL) 10 MG tablet TAKE 1 TABLET BY MOUTH ONCE A DAY 90 tablet 1   Semaglutide (RYBELSUS) 14 MG TABS Take 1 tablet (14 mg) by mouth daily. 90 tablet 0   SYNTHROID 137 MCG tablet Take 1 tablet by mouth daily. 90 tablet 1   empagliflozin (JARDIANCE) 25 MG TABS tablet Take 1 tablet (25 mg total) by mouth daily. 90 tablet 1   empagliflozin (JARDIANCE) 25 MG TABS tablet Take 1 tablet (25 mg total) by mouth daily. 90 tablet 1   fluconazole (DIFLUCAN) 150 MG tablet Take 2 tablets (300 mg total) by mouth once a week. 24 tablet 0   levothyroxine (SYNTHROID) 137 MCG tablet Take 137 mcg by mouth daily before breakfast.     No facility-administered medications prior to visit.    ROS Review of Systems  Constitutional:  Negative for chills, diaphoresis, fatigue, fever and unexpected weight change.  HENT: Negative.    Eyes: Negative.   Respiratory:  Negative for cough, chest tightness, shortness of breath and wheezing.   Cardiovascular:  Negative for chest pain, palpitations and leg swelling.  Gastrointestinal:  Positive for abdominal pain, constipation and nausea. Negative for blood in stool, diarrhea and vomiting.  Endocrine: Negative.   Genitourinary: Negative.  Negative for difficulty urinating, dysuria, frequency and hematuria.  Musculoskeletal:  Positive for arthralgias. Negative for myalgias.       She complains of BLE pain with activity.  Skin: Negative.  Negative for color change and pallor.  Neurological: Negative.  Negative for dizziness.  Hematological:  Negative for adenopathy. Does not bruise/bleed easily.  Psychiatric/Behavioral: Negative.     Objective:  BP 124/80    Pulse 72    Temp 98.5 F (36.9 C) (Oral)    Ht 5\' 7"  (1.702 m)    Wt 174 lb (78.9 kg)    SpO2 94%    BMI 27.25 kg/m   BP Readings from Last 3 Encounters:  02/14/21 124/80  11/02/20 124/66  07/15/20 138/82    Wt Readings from Last 3 Encounters:   02/14/21 174 lb (78.9 kg)  11/02/20 172 lb (78 kg)  07/15/20 167 lb (75.8 kg)    Physical Exam Vitals reviewed.  Constitutional:      General: She is not in acute distress.    Appearance: She is not ill-appearing, toxic-appearing or diaphoretic.  HENT:     Nose: Nose normal.     Mouth/Throat:     Mouth: Mucous membranes are moist.  Eyes:     General: No scleral icterus.    Conjunctiva/sclera: Conjunctivae normal.  Cardiovascular:     Rate and Rhythm: Normal rate and regular rhythm.     Pulses:          Dorsalis pedis pulses are 1+ on the right side and 1+ on the left side.       Posterior tibial pulses are 1+ on the right side and 0 on the left side.     Heart sounds: No murmur heard.   No gallop.  Pulmonary:     Breath sounds: No stridor. No wheezing, rhonchi or rales.  Abdominal:     General: Abdomen is flat. Bowel sounds are normal.     Palpations: There is no hepatomegaly, splenomegaly or mass.     Tenderness: There is abdominal tenderness in the left lower quadrant. There is no guarding or rebound. Negative signs include Murphy's sign.     Hernia: No hernia is present.  Musculoskeletal:        General: Normal range of motion.     Cervical back: Neck supple.     Right lower leg: No edema.     Left lower leg: No edema.  Skin:    General: Skin is warm and dry.  Neurological:     General: No focal deficit present.    Lab Results  Component Value Date   WBC 8.2 02/14/2021   HGB 14.1 02/14/2021   HCT 42.9 02/14/2021   PLT 166.0 02/14/2021   GLUCOSE 119 (H) 02/14/2021   CHOL 213 (H) 02/14/2021   TRIG 219.0 (H) 02/14/2021   HDL 47.60 02/14/2021   LDLDIRECT 140.0 02/14/2021   LDLCALC 107 (H) 12/23/2019   ALT 28 07/15/2020   AST 27 07/15/2020   NA 139 02/14/2021   K 3.7 02/14/2021   CL 104 02/14/2021   CREATININE 0.79 02/14/2021   BUN 11 02/14/2021   CO2 28 02/14/2021   TSH 1.07 02/14/2021   INR 1.0 01/27/2019   HGBA1C 6.8 (H) 02/14/2021   MICROALBUR <0.7  07/15/2020    CT CHEST LUNG CA SCREEN LOW DOSE W/O CM  Result Date: 01/24/2021 CLINICAL DATA:  61 year old female with 54 pack-year history of smoking. Lung cancer screening. EXAM: CT CHEST WITHOUT CONTRAST LOW-DOSE FOR LUNG CANCER SCREENING TECHNIQUE: Multidetector CT imaging of the chest  was performed following the standard protocol without IV contrast. COMPARISON:  Standard CT chest 06/15/2010 FINDINGS: Cardiovascular: The heart size is normal. No substantial pericardial effusion. Coronary artery calcification is evident. Mild atherosclerotic calcification is noted in the wall of the thoracic aorta. Mediastinum/Nodes: No mediastinal lymphadenopathy. No evidence for gross hilar lymphadenopathy although assessment is limited by the lack of intravenous contrast on the current study. The esophagus has normal imaging features. There is no axillary lymphadenopathy. Lungs/Pleura: No suspicious pulmonary nodule or mass. No focal airspace consolidation. No pleural effusion. Upper Abdomen: Unremarkable. Musculoskeletal: No worrisome lytic or sclerotic osseous abnormality. IMPRESSION: 1. Lung-RADS 1, negative. Continue annual screening with low-dose chest CT without contrast in 12 months. 2.  Aortic Atherosclerois (ICD10-170.0) Electronically Signed   By: Misty Stanley M.D.   On: 01/24/2021 06:12  CT Abdomen Pelvis W Contrast  Result Date: 02/15/2021 CLINICAL DATA:  Left lower quadrant abdominal pain. EXAM: CT ABDOMEN AND PELVIS WITH CONTRAST TECHNIQUE: Multidetector CT imaging of the abdomen and pelvis was performed using the standard protocol following bolus administration of intravenous contrast. CONTRAST:  165mL OMNIPAQUE IOHEXOL 300 MG/ML  SOLN COMPARISON:  December 26, 2019.  January 15, 2020. FINDINGS: Lower chest: No acute abnormality. Hepatobiliary: No focal liver abnormality is seen. No gallstones, gallbladder wall thickening, or biliary dilatation. Pancreas: Unremarkable. No pancreatic ductal dilatation  or surrounding inflammatory changes. Spleen: Normal in size without focal abnormality. Adrenals/Urinary Tract: Adrenal glands are unremarkable. Kidneys are normal, without renal calculi, focal lesion, or hydronephrosis. Bladder is unremarkable. Stomach/Bowel: Stomach is within normal limits. Appendix appears normal. No evidence of bowel wall thickening, distention, or inflammatory changes. Sigmoid diverticulosis is noted without inflammation. Vascular/Lymphatic: Aortic atherosclerosis. No enlarged abdominal or pelvic lymph nodes. Reproductive: Status post hysterectomy. No adnexal masses. Other: No abdominal wall hernia or abnormality. No abdominopelvic ascites. Musculoskeletal: No acute or significant osseous findings. IMPRESSION: No acute abnormality seen in the abdomen or pelvis. Aortic Atherosclerosis (ICD10-I70.0). Electronically Signed   By: Marijo Conception M.D.   On: 02/15/2021 13:31   DG ABD ACUTE 2+V W 1V CHEST  Result Date: 02/14/2021 CLINICAL DATA:  Left lower quadrant abdominal pain, bloating EXAM: DG ABDOMEN ACUTE WITH 1 VIEW CHEST COMPARISON:  CT 12/26/2019 FINDINGS: There is no evidence of dilated bowel loops or free intraperitoneal air. Multiple air-fluid levels on upright view. No radiopaque calculi or other significant radiographic abnormality is seen. Heart size and mediastinal contours are within normal limits. Both lungs are clear. IMPRESSION: 1. Multiple air-fluid levels on upright view which may reflect enteritis. No evidence of bowel obstruction. 2. No acute cardiopulmonary findings. Electronically Signed   By: Davina Poke D.O.   On: 02/14/2021 15:23     CT Abdomen Pelvis W Contrast  Result Date: 02/15/2021 CLINICAL DATA:  Left lower quadrant abdominal pain. EXAM: CT ABDOMEN AND PELVIS WITH CONTRAST TECHNIQUE: Multidetector CT imaging of the abdomen and pelvis was performed using the standard protocol following bolus administration of intravenous contrast. CONTRAST:  136mL  OMNIPAQUE IOHEXOL 300 MG/ML  SOLN COMPARISON:  December 26, 2019.  January 15, 2020. FINDINGS: Lower chest: No acute abnormality. Hepatobiliary: No focal liver abnormality is seen. No gallstones, gallbladder wall thickening, or biliary dilatation. Pancreas: Unremarkable. No pancreatic ductal dilatation or surrounding inflammatory changes. Spleen: Normal in size without focal abnormality. Adrenals/Urinary Tract: Adrenal glands are unremarkable. Kidneys are normal, without renal calculi, focal lesion, or hydronephrosis. Bladder is unremarkable. Stomach/Bowel: Stomach is within normal limits. Appendix appears normal. No evidence of bowel wall thickening,  distention, or inflammatory changes. Sigmoid diverticulosis is noted without inflammation. Vascular/Lymphatic: Aortic atherosclerosis. No enlarged abdominal or pelvic lymph nodes. Reproductive: Status post hysterectomy. No adnexal masses. Other: No abdominal wall hernia or abnormality. No abdominopelvic ascites. Musculoskeletal: No acute or significant osseous findings. IMPRESSION: No acute abnormality seen in the abdomen or pelvis. Aortic Atherosclerosis (ICD10-I70.0). Electronically Signed   By: Marijo Conception M.D.   On: 02/15/2021 13:31     Assessment & Plan:   Tasha [name] was seen today for abdominal pain and annual exam.  Diagnoses and all orders for this visit:  Primary hypertension- Her blood pressure is adequately well controlled. -     Basic metabolic panel; Future -     Urinalysis, Routine w reflex microscopic; Future -     CBC with Differential/Platelet; Future -     CBC with Differential/Platelet -     Urinalysis, Routine w reflex microscopic -     Basic metabolic panel  Acquired hypothyroidism- Her TSH is in the normal range.  She will stay on the current T4 dosage. -     TSH; Future -     TSH -     SYNTHROID 137 MCG tablet; Take 1 tablet by mouth daily.  Type II diabetes mellitus with manifestations ([place])- Her blood sugar is adequately  well controlled. -     Basic metabolic panel; Future -     Hemoglobin A1c; Future -     Hemoglobin A1c -     Basic metabolic panel  Hyperglyceridemia, pure- This has improved. -     Lipid panel; Future -     Lipid panel  Hyperlipidemia with target LDL less than 130- She has not achieved her LDL goal. -     Lipid panel; Future -     Lipid panel -     pravastatin (PRAVACHOL) 10 MG tablet; TAKE 1 TABLET BY MOUTH ONCE A DAY  Vitamin D deficiency  Gastroesophageal reflux disease with esophagitis without hemorrhage  Encounter for general adult medical examination with abnormal findings- Exam completed, labs reviewed, vaccines reviewed and updated, cancer screenings are up-to-date, patient education was given.  Need for shingles vaccine -     Zoster Vaccine Adjuvanted [facility]) injection; Inject 0.5 mLs into the muscle once for 1 dose.  Left lower quadrant abdominal tenderness without rebound tenderness- The work-up for this is unremarkable.  This is likely IBS-C.  She has not previously responded to medications like Amitiza. -     Lipase; Future -     Amylase; Future -     Urinalysis, Routine w reflex microscopic; Future -     CBC with Differential/Platelet; Future -     DG ABD ACUTE 2+V W 1V CHEST; Future -     CBC with Differential/Platelet -     Urinalysis, Routine w reflex microscopic -     Amylase -     Lipase -     CT Abdomen Pelvis W Contrast; Future  Chronic idiopathic constipation- Labs to screen for secondary causes are normal. -     Magnesium; Future -     Magnesium  Extremity atherosclerosis with intermittent claudication (HCC) -     VAS [place] ABI WITH/WO TBI; Future  Multiple air fluid levels of small intestine determined by X-ray -     CT Abdomen Pelvis W Contrast; Future  Other orders -     LDL cholesterol, direct   I have discontinued [facility] "Kim"'s fluconazole, empagliflozin, Jardiance, and Rybelsus. I  am also having her start on Shingrix.  Additionally, I am having her maintain her icosapent Ethyl, albuterol, buPROPion, ezetimibe, omeprazole, pioglitazone, potassium chloride SA, ALPRAZolam, sertraline, meloxicam, Synthroid, and pravastatin.  Meds ordered this encounter  Medications   Zoster Vaccine Adjuvanted [facility]) injection    Sig: Inject 0.5 mLs into the muscle once for 1 dose.    Dispense:  0.5 mL    Refill:  1   SYNTHROID 137 MCG tablet    Sig: Take 1 tablet by mouth daily.    Dispense:  90 tablet    Refill:  1   pravastatin (PRAVACHOL) 10 MG tablet    Sig: TAKE 1 TABLET BY MOUTH ONCE A DAY    Dispense:  90 tablet    Refill:  1     Follow-up: Return in about 3 weeks (around 03/07/2021).  [name], MD

## 2021-02-14 NOTE — Patient Instructions (Signed)

## 2021-02-15 ENCOUNTER — Other Ambulatory Visit (HOSPITAL_COMMUNITY): Payer: Self-pay

## 2021-02-15 ENCOUNTER — Ambulatory Visit: Payer: Medicare Other | Admitting: Emergency Medicine

## 2021-02-15 ENCOUNTER — Ambulatory Visit (INDEPENDENT_AMBULATORY_CARE_PROVIDER_SITE_OTHER)
Admission: RE | Admit: 2021-02-15 | Discharge: 2021-02-15 | Disposition: A | Discharge status: Home / Self Care | Payer: No Typology Code available for payment source | Source: Ambulatory Visit | Attending: Internal Medicine | Admitting: Internal Medicine

## 2021-02-15 DIAGNOSIS — R933 Abnormal findings on diagnostic imaging of other parts of digestive tract: Secondary | ICD-10-CM

## 2021-02-15 DIAGNOSIS — R10814 Left lower quadrant abdominal tenderness: Secondary | ICD-10-CM | POA: Diagnosis not present

## 2021-02-15 DIAGNOSIS — R109 Unspecified abdominal pain: Secondary | ICD-10-CM | POA: Diagnosis not present

## 2021-02-15 LAB — URINALYSIS, ROUTINE W REFLEX MICROSCOPIC
Bilirubin Urine: NEGATIVE
Hgb urine dipstick: NEGATIVE
Ketones, ur: NEGATIVE
Leukocytes,Ua: NEGATIVE
Nitrite: NEGATIVE
RBC / HPF: NONE SEEN (ref 0–?)
Specific Gravity, Urine: >=1.03 — AB (ref 1.000–1.030)
Total Protein, Urine: NEGATIVE
Urine Glucose: NEGATIVE
Urobilinogen, UA: 0.2 (ref 0.0–1.0)
pH: 5.5 (ref 5.0–8.0)

## 2021-02-15 MED ORDER — IOHEXOL 300 MG/ML  SOLN
100.0000 mL | Freq: Once | INTRAMUSCULAR | Status: AC | PRN
  Administered 2021-02-15: 100 mL via INTRAVENOUS

## 2021-02-15 MED ORDER — SYNTHROID 137 MCG PO TABS
ORAL_TABLET | ORAL | 1 refills | Status: DC
  Filled 2021-02-23: qty 90, 90d supply, fill #0

## 2021-02-15 MED ORDER — PRAVASTATIN SODIUM 10 MG PO TABS
ORAL_TABLET | Freq: Every day | ORAL | 1 refills | Status: DC
  Filled 2021-02-15: qty 90, 90d supply, fill #0

## 2021-02-23 ENCOUNTER — Other Ambulatory Visit: Payer: Self-pay | Admitting: Internal Medicine

## 2021-02-23 ENCOUNTER — Telehealth: Payer: Self-pay | Admitting: Internal Medicine

## 2021-02-23 ENCOUNTER — Other Ambulatory Visit (HOSPITAL_COMMUNITY): Payer: Self-pay

## 2021-02-23 DIAGNOSIS — E039 Hypothyroidism, unspecified: Secondary | ICD-10-CM

## 2021-02-23 MED ORDER — SYNTHROID 137 MCG PO TABS
ORAL_TABLET | ORAL | 1 refills | Status: DC
  Filled 2021-02-23: qty 90, fill #0

## 2021-02-23 MED ORDER — LEVOTHYROXINE SODIUM 137 MCG PO TABS
137.0000 ug | ORAL_TABLET | Freq: Every day | ORAL | 1 refills | Status: DC
  Filled 2021-02-23: qty 90, 90d supply, fill #0
  Filled 2021-05-30: qty 90, 90d supply, fill #1

## 2021-02-23 NOTE — Telephone Encounter (Signed)
Pharmacy stated they faxed over a request for patient to switch from SYNTHROID 137 MCG tablet  to Levothyroxine 137 MCG  Rep states she just received a rx for synthroid, rep requesting a medication change

## 2021-02-24 ENCOUNTER — Telehealth: Payer: Self-pay | Admitting: Internal Medicine

## 2021-02-24 ENCOUNTER — Ambulatory Visit: Payer: Medicare Other | Admitting: Internal Medicine

## 2021-02-24 ENCOUNTER — Other Ambulatory Visit: Payer: Self-pay | Admitting: Internal Medicine

## 2021-02-24 DIAGNOSIS — G8929 Other chronic pain: Secondary | ICD-10-CM | POA: Insufficient documentation

## 2021-02-24 DIAGNOSIS — M5442 Lumbago with sciatica, left side: Secondary | ICD-10-CM

## 2021-02-24 NOTE — Telephone Encounter (Signed)
Patient calling in  Patient was seen in office 01/09 for some sevre back pain from her lower backer to upper part of buttocks area  Patient says she is still experiencing that pain & it is causing her a lot of pain when walking & wants to know if provider recommends she see a specialist  Please call patient [phone]

## 2021-02-26 ENCOUNTER — Other Ambulatory Visit (HOSPITAL_COMMUNITY): Payer: Self-pay

## 2021-02-28 ENCOUNTER — Ambulatory Visit (INDEPENDENT_AMBULATORY_CARE_PROVIDER_SITE_OTHER): Payer: No Typology Code available for payment source

## 2021-02-28 ENCOUNTER — Other Ambulatory Visit: Payer: Self-pay

## 2021-02-28 ENCOUNTER — Encounter: Payer: Self-pay | Admitting: Physician Assistant

## 2021-02-28 ENCOUNTER — Telehealth: Payer: Self-pay | Admitting: Internal Medicine

## 2021-02-28 ENCOUNTER — Ambulatory Visit (INDEPENDENT_AMBULATORY_CARE_PROVIDER_SITE_OTHER): Payer: No Typology Code available for payment source | Admitting: Physician Assistant

## 2021-02-28 VITALS — Ht 67.0 in | Wt 175.0 lb

## 2021-02-28 DIAGNOSIS — M545 Low back pain, unspecified: Secondary | ICD-10-CM | POA: Diagnosis not present

## 2021-02-28 DIAGNOSIS — G8929 Other chronic pain: Secondary | ICD-10-CM | POA: Diagnosis not present

## 2021-02-28 NOTE — Progress Notes (Signed)
Office Visit Note   Patient: Tasha [name]           Date of Birth: 08/22/1960           MRN: [id number] Visit Date: 02/28/2021              Requested by: Janith Lima, MD [address] [place],  [place] [address] PCP: Janith Lima, MD  Chief Complaint  Patient presents with   Lower Back - Pain      HPI: Patient is a pleasant 61 year old woman who presents today with lower back pain radiating more on the left with bilateral numbing and tingling radiating down into her legs.  She states she has had intermittent back issues in the past however this was exacerbated about a month ago after she was trying on a pair of boots and did not have anywhere to sit.  There was a piece of cardboard in one of the boots and it caused her to be off balance and fall.  She denies any loss of bowel or bladder control.  She is on meloxicam for a foot issue.  She notices sometimes she has to adjust her legs at night to relieve some of the pain.  She is also currently being referred by her primary care doctor for evaluation of what I believed to be a claudication issue arthrosclerosis.  She is wondering if this is what is going on in her back  Assessment & Plan: Visit Diagnoses:  1. Chronic bilateral low back pain, unspecified whether sciatica present     Plan: I explained to the patient I think it is very important that she continue with her vascular workout per her recommendations from her primary care provider.  While this could be claudication given her symptoms and chain of events I do think she may have an element of arthritis and findings of sciatica.  Her strength is actually pretty good.  With her history and sciatica radiating both down her legs and getting worse I recommend an MRI.  We can base a referral [facility] for [facility]  Follow-Up Instructions: No follow-ups on file.   Ortho Exam  Patient is alert, oriented, no adenopathy, well-dressed, normal affect, normal respiratory  effort. Examination patient is alert pleasant to exam normal respiratory effort.  Examination of her spine do not palpate any step-offs or abnormalities.  She has increased pain with flexing forward not as much flexing backwards.  Her dorsiflexion plantarflexion extension flexion of ankles knees and hips strength is 5 out of 5 and equal.  Deep tendon reflexes are equal.  She does have sensation changes that run down from her back bilaterally into her lower legs left slightly worse than right.  She says this goes down to just above her ankle.  Imaging: XR Lumbar Spine 2-3 Views  Result Date: 02/28/2021 2 view radiographs of her lumbar spine were obtained today.  She does have some slight listhesis at L4-5.  Joint spacing overall well-maintained although she does have anterior osteophytes throughout her lumbar spine.  No acute osseous fractures are noted  No images are attached to the encounter.  Labs: Lab Results  Component Value Date   HGBA1C 6.8 (H) 02/14/2021   HGBA1C 7.1 (H) 07/15/2020   HGBA1C 7.1 (H) 04/29/2020   ESRSEDRATE 38 (H) 04/05/2010   ESRSEDRATE 19 04/06/2008   ESRSEDRATE 7 10/17/2007   LABORGA NO GROWTH 01/18/2015     Lab Results  Component Value Date   ALBUMIN 4.4  07/15/2020   ALBUMIN 4.1 12/20/2019   ALBUMIN 4.2 07/22/2019    Lab Results  Component Value Date   MG 1.9 02/14/2021   MG 1.8 04/29/2020   Lab Results  Component Value Date   VD25OH 82.97 07/22/2019   VD25OH 38.88 04/10/2019   VD25OH 35.86 01/27/2019    No results found for: PREALBUMIN CBC EXTENDED Latest Ref Rng & Units 02/14/2021 11/02/2020 07/15/2020  WBC 4.0 - 10.5 K/uL 8.2 6.6 7.7  RBC 3.87 - 5.11 Mil/uL 4.47 4.55 4.55  HGB 12.0 - 15.0 g/dL 14.1 14.4 14.5  HCT 36.0 - 46.0 % 42.9 43.3 43.5  PLT 150.0 - 400.0 K/uL 166.0 140.0(L) 149.0(L)  NEUTROABS 1.4 - 7.7 K/uL 4.3 3.5 3.7  LYMPHSABS 0.7 - 4.0 K/uL 3.4 2.7 3.6     Body mass index is 27.41 kg/m.  Orders:  Orders Placed This Encounter   Procedures   XR Lumbar Spine 2-3 Views   MR Lumbar Spine w/o contrast   No orders of the defined types were placed in this encounter.    Procedures: No procedures performed  Clinical Data: No additional findings.  ROS:  All other systems negative, except as noted in the HPI. Review of Systems  All other systems reviewed and are negative.  Objective: Vital Signs: Ht 5\' 7"  (1.702 m)    Wt 175 lb (79.4 kg)    BMI 27.41 kg/m   Specialty Comments:  No specialty comments available.  PMFS History: Patient Active Problem List   Diagnosis Date Noted   Chronic bilateral low back pain with bilateral sciatica 02/24/2021   Need for shingles vaccine 02/14/2021   Chronic idiopathic constipation 02/14/2021   Hypertension 07/18/2020   Type II diabetes mellitus with manifestations ([place]) 07/15/2020   Pain due to onychomycosis of nail 07/15/2020   Mucopurulent chronic bronchitis ([place]) 07/15/2020   Tobacco abuse 07/15/2020   Encounter for general adult medical examination with abnormal findings 05/01/2020   Common bile duct dilation 01/09/2020   Thrombocytopenia ([place]) 12/23/2019   Severe episode of recurrent major depressive disorder, with psychotic features ([place]) 10/08/2019   Hyperglyceridemia, pure 08/07/2018   Polyp of colon 07/25/2018   Esophageal dysphagia 07/25/2018   HIV antibody positive ([place]) 09/27/2017   Mild intermittent asthma without complication 82/95/6213   Primary insomnia 04/10/2016   Extremity atherosclerosis with intermittent claudication ([place]) 04/10/2016   Routine general medical examination at a health care facility 12/20/2014   Visit for screening mammogram 12/16/2014   Cervical cancer screening 12/16/2014   Primary osteoarthritis of both knees 06/22/2014   Migraine without aura and with status migrainosus, not intractable 02/03/2014   Nonspecific abnormal electrocardiogram (ECG) (EKG) 12/01/2013   Estrogen deficiency 05/20/2013   Vitamin D deficiency  04/23/2013   Hyperlipidemia with target LDL less than 130 09/08/2007   Periodic limb movement disorder 09/08/2007   CIGARETTE SMOKER 09/04/2007   Hypothyroidism 11/22/2006   Depression with anxiety 11/22/2006   GERD 11/22/2006   IBS 08/65/7846   Nonalcoholic steatohepatitis ([facility]) 11/22/2006   Past Medical History:  Diagnosis Date   Allergy    Anemia    past hx    Anxiety state, unspecified    Arthralgia of temporomandibular joint    Arthritis    Asthma    as a child , not now per pt    Conversion disorder    Dementia ([place])    Depressive disorder, not elsewhere classified    Diabetes mellitus without complication ([place])    off all medicines as  of 04-16-2019 PV    Dysphagia, unspecified(787.20)    Endometriosis    Esophageal reflux    Fibromyalgia    Headache(784.0)    Heart murmur    past hx murmur    Hx of adenomatous colonic polyps    Hypertension 07/18/2020   Irritable bowel syndrome    Mitral valve disorders(424.0)    Myalgia and myositis, unspecified    Overweight(278.02)    Periodic limb movement disorder    Pure hypercholesterolemia    RLS (restless legs syndrome)    Seizures (HCC)    last episode 5 yrs ago    Stroke [facility])    TIA per pt many years ago    Unspecified hypothyroidism     Family History  Problem Relation Age of Onset   Heart disease Mother    Heart disease Father    Diabetes Father    Colon cancer Father 30       died age 51 from colon cancer spreading per pt    Breast cancer Maternal Aunt    Colon cancer Paternal Aunt    Stomach cancer Paternal Aunt    Lung cancer Paternal Aunt    Rectal cancer Paternal Aunt    Stomach cancer Paternal Uncle    Colon polyps Neg Hx    Esophageal cancer Neg Hx     Past Surgical History:  Procedure Laterality Date   ABDOMINAL HYSTERECTOMY     TAH   arm surgery     COLONOSCOPY  04/14/2008   FOOT SURGERY     LIPOMA EXCISION     BESIDES BELLY BUTTON   POLYPECTOMY     HPP x 1   TONSILLECTOMY      UPPER GASTROINTESTINAL ENDOSCOPY  2010   [facility]    WRIST SURGERY     Social History   Occupational History   Occupation: Disabled    Employer: [employer]  Tobacco Use   Smoking status: Some Days    Packs/day: 1.50    Years: 43.00    Pack years: 64.50    Types: Cigarettes   Smokeless tobacco: Never   Tobacco comments:    1-2 per day  Vaping Use   Vaping Use: Some days   Substances: Nicotine, Flavoring  Substance and Sexual Activity   Alcohol use: Yes    Comment: rarely   Drug use: No   Sexual activity: Yes

## 2021-02-28 NOTE — Telephone Encounter (Signed)
Patient calling in  Patient says she is constantly burping & spitting up "bowel" & also complains of being super gassy  Patient says she has been experiencing symptoms for about 3 weeks.. had testing done & everything came back ok  Wants to know what provider recommends she take to help stop the burping [phone]

## 2021-03-01 NOTE — Telephone Encounter (Signed)
Pt has been informed that Rx is OTC and expressed understanding.

## 2021-03-01 NOTE — Telephone Encounter (Signed)
Pt returned the call I advised pt that Dr. Ronnald Ramp wants her to start on Gaviscon but we would reach back out to let her know if he will send in a Rx or whether it will be OTC.

## 2021-03-04 ENCOUNTER — Telehealth: Payer: Self-pay | Admitting: Acute Care

## 2021-03-04 NOTE — Telephone Encounter (Signed)
She stated that she received a letter in reference to low dose CT and was advised to contact PCP regarding cardiac findings. She stated that she has attempted to contact PCP in regards to this but has not been successful. She would like for Judson Roch to reach out to PCP.  Sarah, please advise. Thanks

## 2021-03-07 NOTE — Telephone Encounter (Signed)
Lm for patient.  

## 2021-03-08 ENCOUNTER — Other Ambulatory Visit: Payer: Self-pay

## 2021-03-08 ENCOUNTER — Ambulatory Visit (HOSPITAL_COMMUNITY)
Admission: RE | Admit: 2021-03-08 | Discharge: 2021-03-08 | Disposition: A | Discharge status: Home / Self Care | Payer: No Typology Code available for payment source | Source: Ambulatory Visit | Attending: Physician Assistant | Admitting: Physician Assistant

## 2021-03-08 DIAGNOSIS — G8929 Other chronic pain: Secondary | ICD-10-CM | POA: Insufficient documentation

## 2021-03-08 DIAGNOSIS — M545 Low back pain, unspecified: Secondary | ICD-10-CM | POA: Insufficient documentation

## 2021-03-09 NOTE — Telephone Encounter (Signed)
Patient calling in  Would like Dr. Ronnald Ramp to review results from recent CT & MRI & give his recommendations  Please call patient [phone]

## 2021-03-10 NOTE — Telephone Encounter (Signed)
Pt has been informed of results and expressed understanding.

## 2021-03-11 ENCOUNTER — Encounter: Payer: Self-pay | Admitting: Pharmacist

## 2021-03-11 DIAGNOSIS — G720 Drug-induced myopathy: Secondary | ICD-10-CM

## 2021-03-11 DIAGNOSIS — T466X5A Adverse effect of antihyperlipidemic and antiarteriosclerotic drugs, initial encounter: Secondary | ICD-10-CM

## 2021-03-11 NOTE — Progress Notes (Signed)
[facility])                                            [place] Team                                        Statin Quality Measure Assessment    03/11/2021  Tasha [name] May 12, 1960 [id number]  Per review of chart and payor information, this patient has been flagged for non-adherence to the following CMS Quality Measure:   [x]  Statin Use in Persons with Diabetes  []  Statin Use in Persons with Cardiovascular Disease  The 10-year ASCVD risk score (Arnett DK, et al., 2019) is: 14.9%   Values used to calculate the score:     Age: 61 years     Sex: Female     Is Non-Hispanic African American: No     Diabetic: Yes     Tobacco smoker: Yes     Systolic Blood Pressure: 694 mmHg     Is BP treated: No     HDL Cholesterol: 47.6 mg/dL     Total Cholesterol: 213 mg/dL   Currently prescribed statin:  []  Yes [x]  No     Comments: Intolerant  to statins due to muscle weakness/pain    Please consider coding for past statin intolerance or other exclusions (required annually) with one of these examples to remove patient from SUPD measure for 2023 if clinically appropriate  Drug Induced Myopathy G72.0   Myositis, unspecified M60.9     Myopathy, unspecified   G72.9    Thank you for your time, Ralene Bathe, PharmD, [place] [phone]

## 2021-03-15 ENCOUNTER — Encounter: Payer: Self-pay | Admitting: Internal Medicine

## 2021-03-15 ENCOUNTER — Encounter: Payer: Self-pay | Admitting: Physician Assistant

## 2021-03-15 ENCOUNTER — Ambulatory Visit (INDEPENDENT_AMBULATORY_CARE_PROVIDER_SITE_OTHER): Payer: No Typology Code available for payment source | Admitting: Internal Medicine

## 2021-03-15 ENCOUNTER — Other Ambulatory Visit (HOSPITAL_COMMUNITY): Payer: Self-pay

## 2021-03-15 ENCOUNTER — Ambulatory Visit (INDEPENDENT_AMBULATORY_CARE_PROVIDER_SITE_OTHER): Payer: No Typology Code available for payment source | Admitting: Physician Assistant

## 2021-03-15 ENCOUNTER — Other Ambulatory Visit: Payer: Self-pay

## 2021-03-15 VITALS — BP 100/62 | HR 64 | Ht 66.25 in | Wt 174.5 lb

## 2021-03-15 VITALS — BP 116/60 | HR 78 | Temp 98.7°F | Ht 66.0 in | Wt 170.0 lb

## 2021-03-15 DIAGNOSIS — E785 Hyperlipidemia, unspecified: Secondary | ICD-10-CM | POA: Diagnosis not present

## 2021-03-15 DIAGNOSIS — E118 Type 2 diabetes mellitus with unspecified complications: Secondary | ICD-10-CM | POA: Diagnosis not present

## 2021-03-15 DIAGNOSIS — R1013 Epigastric pain: Secondary | ICD-10-CM | POA: Diagnosis not present

## 2021-03-15 DIAGNOSIS — F333 Major depressive disorder, recurrent, severe with psychotic symptoms: Secondary | ICD-10-CM

## 2021-03-15 DIAGNOSIS — K5900 Constipation, unspecified: Secondary | ICD-10-CM

## 2021-03-15 DIAGNOSIS — K2100 Gastro-esophageal reflux disease with esophagitis, without bleeding: Secondary | ICD-10-CM

## 2021-03-15 MED ORDER — TIRZEPATIDE 2.5 MG/0.5ML ~~LOC~~ SOAJ
2.5000 mg | SUBCUTANEOUS | 0 refills | Status: DC
  Filled 2021-03-15 – 2021-05-25 (×4): qty 2, 28d supply, fill #0

## 2021-03-15 MED ORDER — SERTRALINE HCL 50 MG PO TABS
50.0000 mg | ORAL_TABLET | Freq: Every day | ORAL | 0 refills | Status: DC
  Filled 2021-03-15: qty 30, 30d supply, fill #0

## 2021-03-15 MED ORDER — EMPAGLIFLOZIN 10 MG PO TABS
10.0000 mg | ORAL_TABLET | Freq: Every day | ORAL | 0 refills | Status: DC
  Filled 2021-03-15: qty 90, 90d supply, fill #0

## 2021-03-15 MED ORDER — PRAVASTATIN SODIUM 10 MG PO TABS
ORAL_TABLET | Freq: Every day | ORAL | 1 refills | Status: DC
  Filled 2021-03-15: qty 90, 90d supply, fill #0
  Filled 2021-06-28: qty 90, 90d supply, fill #1

## 2021-03-15 MED ORDER — PANTOPRAZOLE SODIUM 40 MG PO TBEC
40.0000 mg | DELAYED_RELEASE_TABLET | Freq: Two times a day (BID) | ORAL | 5 refills | Status: DC
  Filled 2021-03-15: qty 60, 30d supply, fill #0
  Filled 2021-04-29: qty 60, 30d supply, fill #1
  Filled 2021-05-30: qty 60, 30d supply, fill #2

## 2021-03-15 MED ORDER — VORTIOXETINE HBR 5 MG PO TABS
5.0000 mg | ORAL_TABLET | Freq: Every day | ORAL | 0 refills | Status: DC
  Filled 2021-03-15: qty 30, 30d supply, fill #0

## 2021-03-15 NOTE — Patient Instructions (Signed)
Type 2 Diabetes Mellitus, Diagnosis, Adult ?Type 2 diabetes (type 2 diabetes mellitus) is a long-term, or chronic, disease. In type 2 diabetes, one or both of these problems may be present: ?The pancreas does not make enough of a hormone called insulin. ?Cells in the body do not respond properly to the insulin that the body makes (insulin resistance). ?Normally, insulin allows blood sugar (glucose) to enter cells in the body. The cells use glucose for energy. Insulin resistance or lack of insulin causes excess glucose to build up in the blood instead of going into cells. This causes high blood glucose (hyperglycemia).  ?What are the causes? ?The exact cause of type 2 diabetes is not known. ?What increases the risk? ?The following factors may make you more likely to develop this condition: ?Having a family member with type 2 diabetes. ?Being overweight or obese. ?Being inactive (sedentary). ?Having been diagnosed with insulin resistance. ?Having a history of prediabetes, diabetes when you were pregnant (gestational diabetes), or polycystic ovary syndrome (PCOS). ?What are the signs or symptoms? ?In the early stage of this condition, you may not have symptoms. Symptoms develop slowly and may include: ?Increased thirst or hunger. ?Increased urination. ?Unexplained weight loss. ?Tiredness (fatigue) or weakness. ?Vision changes, such as blurry vision. ?Dark patches on the skin. ?How is this diagnosed? ?This condition is diagnosed based on your symptoms, your medical history, a physical exam, and your blood glucose level. Your blood glucose may be checked with one or more of the following blood tests: ?A fasting blood glucose (FBG) test. You will not be allowed to eat (you will fast) for 8 hours or longer before a blood sample is taken. ?A random blood glucose test. This test checks blood glucose at any time of day regardless of when you ate. ?An A1C (hemoglobin A1C) blood test. This test provides information about blood  glucose levels over the previous 2-3 months. ?An oral glucose tolerance test (OGTT). This test measures your blood glucose at two times: ?After fasting. This is your baseline blood glucose level. ?Two hours after drinking a beverage that contains glucose. ?You may be diagnosed with type 2 diabetes if: ?Your fasting blood glucose level is 126 mg/dL (7.0 mmol/L) or higher. ?Your random blood glucose level is 200 mg/dL (11.1 mmol/L) or higher. ?Your A1C level is 6.5% or higher. ?Your oral glucose tolerance test result is higher than 200 mg/dL (11.1 mmol/L). ?These blood tests may be repeated to confirm your diagnosis. ?How is this treated? ?Your treatment may be managed by a specialist called an endocrinologist. Type 2 diabetes may be treated by following instructions from your health care provider about: ?Making dietary and lifestyle changes. These may include: ?Following a personalized nutrition plan that is developed by a registered dietitian. ?Exercising regularly. ?Finding ways to manage stress. ?Checking your blood glucose level as often as told. ?Taking diabetes medicines or insulin daily. This helps to keep your blood glucose levels in the healthy range. ?Taking medicines to help prevent complications from diabetes. Medicines may include: ?Aspirin. ?Medicine to lower cholesterol. ?Medicine to control blood pressure. ?Your health care provider will set treatment goals for you. Your goals will be based on your age, other medical conditions you have, and how you respond to diabetes treatment. Generally, the goal of treatment is to maintain the following blood glucose levels: ?Before meals: 80-130 mg/dL (4.4-7.2 mmol/L). ?After meals: below 180 mg/dL (10 mmol/L). ?A1C level: less than 7%. ?Follow these instructions at home: ?Questions to ask your health care provider ?  Consider asking the following questions: ?Should I meet with a certified diabetes care and education specialist? ?What diabetes medicines do I need,  and when should I take them? ?What equipment will I need to manage my diabetes at home? ?How often do I need to check my blood glucose? ?Where can I find a support group for people with diabetes? ?What number can I call if I have questions? ?When is my next appointment? ?General instructions ?Take over-the-counter and prescription medicines only as told by your health care provider. ?Keep all follow-up visits. This is important. ?Where to find more information ?For help and guidance and for more information about diabetes, please visit: ?American Diabetes Association (ADA): www.diabetes.org ?American Association of Diabetes Care and Education Specialists (ADCES): www.diabeteseducator.org ?International Diabetes Federation (IDF): www.idf.org ?Contact a health care provider if: ?Your blood glucose is at or above 240 mg/dL (13.3 mmol/L) for 2 days in a row. ?You have been sick or have had a fever for 2 days or longer, and you are not getting better. ?You have any of the following problems for more than 6 hours: ?You cannot eat or drink. ?You have nausea and vomiting. ?You have diarrhea. ?Get help right away if: ?You have severe hypoglycemia. This means your blood glucose is lower than 54 mg/dL (3.0 mmol/L). ?You become confused or you have trouble thinking clearly. ?You have difficulty breathing. ?You have moderate or large ketone levels in your urine. ?These symptoms may represent a serious problem that is an emergency. Do not wait to see if the symptoms will go away. Get medical help right away. Call your local emergency services (911 in the [place] Do not drive yourself to the hospital. ?Summary ?Type 2 diabetes mellitus is a long-term, or chronic, disease. In type 2 diabetes, the pancreas does not make enough of a hormone called insulin, or cells in the body do not respond properly to insulin that the body makes. ?This condition is treated by making dietary and lifestyle changes and taking diabetes medicines or  insulin. ?Your health care provider will set treatment goals for you. Your goals will be based on your age, other medical conditions you have, and how you respond to diabetes treatment. ?Keep all follow-up visits. This is important. ?This information is not intended to replace advice given to you by your health care provider. Make sure you discuss any questions you have with your health care provider. ?Document Revised: 04/19/2020 Document Reviewed: 04/19/2020 ?Elsevier Patient Education ? 2022 [employer] ? ?

## 2021-03-15 NOTE — Progress Notes (Signed)
Chief Complaint: Change in bowel habits, vomiting  HPI:    Tasha [name] is a 61 year old female with a past medical history as listed below including anxiety and depression as well as fibromyalgia, known to Dr. Carlean Purl, who was referred to me by Janith Lima, MD for a complaint of change in bowel habits and vomiting.    06/23/2019 colonoscopy and EGD.  Colonoscopy was 7 diminutive polyps, diverticulosis and internal hemorrhoids, pathology showed tubular adenomas and repeat recommended in 2024.  EGD with tortuous esophagus and empiric dilation performed.    02/14/2021 BMP with minimally elevated glucose at 119 otherwise normal.  Normal lipase, amylase and TSH as well as CBC.  X-ray that day showed multiple air-fluid levels on upright view which may reflect enteritis, no evidence of bowel obstruction.    02/15/2021 CT of the abdomen pelvis with contrast with no acute abnormality and aortic atherosclerosis.    Today, the patient tells me that she is having something that tastes and smells like "bowel", come up in her mouth at night while she is sleeping.  Then in the morning when she gets up if she burps she tastes that same thing.  This has been occurring over the past few months regardless of her Omeprazole 40 mg twice daily.  Does tell me that she has been laying flat with only 1 pillow when normally she sleeps with at least 3 because of some back troubles and is elevating her legs at night.  Tells me she stops eating 3 hours before going to bed and her diet has not changed.  She is currently on Rybelsus which she has been on for a year.  Apparently took this a year and a half or so ago and this initially helped her to lose weight but she now has gained around 20 pounds and it is not working anymore.    Also complains of constipation for which she takes stool softeners every few days in order to have a bowel movement but often times it is hard to pass and hard stool.    Denies fever, chills, weight loss,  blood in her stool, recent nausea or vomiting.  Past Medical History:  Diagnosis Date   Allergy    Anemia    past hx    Anxiety state, unspecified    Arthralgia of temporomandibular joint    Arthritis    Asthma    as a child , not now per pt    Conversion disorder    Dementia ([place])    Depressive disorder, not elsewhere classified    Diabetes mellitus without complication ([place])    off all medicines as of 04-16-2019 PV    Dysphagia, unspecified(787.20)    Endometriosis    Esophageal reflux    Fibromyalgia    Headache(784.0)    Heart murmur    past hx murmur    Hx of adenomatous colonic polyps    Hypertension 07/18/2020   Irritable bowel syndrome    Mitral valve disorders(424.0)    Myalgia and myositis, unspecified    Overweight(278.02)    Periodic limb movement disorder    Pure hypercholesterolemia    RLS (restless legs syndrome)    Seizures ([place])    last episode 5 yrs ago    Stroke [facility])    TIA per pt many years ago    Unspecified hypothyroidism     Past Surgical History:  Procedure Laterality Date   ABDOMINAL HYSTERECTOMY     TAH   arm  surgery     COLONOSCOPY  04/14/2008   FOOT SURGERY     LIPOMA EXCISION     BESIDES BELLY BUTTON   POLYPECTOMY     HPP x 1   TONSILLECTOMY     UPPER GASTROINTESTINAL ENDOSCOPY  2010   [facility]    WRIST SURGERY      Current Outpatient Medications  Medication Sig Dispense Refill   albuterol (VENTOLIN HFA) 108 (90 Base) MCG/ACT inhaler Inhale 2 puffs into the lungs every 6 (six) hours as needed for wheezing or shortness of breath. 8.5 g 0   ALPRAZolam (XANAX) 1 MG tablet TAKE 1 TABLET BY MOUTH 3 TIMES DAILY AS NEEDED FOR ANXIETY. 90 tablet 5   buPROPion (WELLBUTRIN XL) 150 MG 24 hr tablet Take 1 tablet by mouth daily. 90 tablet 1   ezetimibe (ZETIA) 10 MG tablet TAKE 1 TABLET BY MOUTH DAILY. 90 tablet 1   levothyroxine (SYNTHROID) 137 MCG tablet Take 1 tablet (137 mcg total) by mouth daily before breakfast. 90 tablet 1    meloxicam (MOBIC) 15 MG tablet Take 1 tablet (15 mg total) by mouth daily. 30 tablet 0   omeprazole (PRILOSEC) 40 MG capsule TAKE 2 CAPSULES BY MOUTH ONCE A DAY. 180 capsule 1   pioglitazone (ACTOS) 15 MG tablet TAKE 1 TABLET BY MOUTH DAILY 90 tablet 1   potassium chloride SA (KLOR-CON M) 20 MEQ tablet TAKE 1 TABLET BY MOUTH 2 TIMES DAILY. 180 tablet 0   pravastatin (PRAVACHOL) 10 MG tablet TAKE 1 TABLET BY MOUTH ONCE A DAY 90 tablet 1   sertraline (ZOLOFT) 100 MG tablet TAKE 2 TABLETS BY MOUTH DAILY 90 tablet 0   No current facility-administered medications for this visit.    Allergies as of 03/15/2021 - Review Complete 03/15/2021  Allergen Reaction Noted   Farxiga [dapagliflozin] Other (See Comments) 03/19/2017   Metformin and related Diarrhea 04/10/2019   Statins Other (See Comments) 03/26/2015   Augmentin [amoxicillin-pot clavulanate] Other (See Comments) 08/05/2019   Crestor [rosuvastatin] Other (See Comments) 04/22/2013   Lipitor [atorvastatin] Other (See Comments) 04/22/2013   Zocor [simvastatin] Other (See Comments) 12/16/2013    Family History  Problem Relation Age of Onset   Heart disease Mother    Heart disease Father    Diabetes Father    Colon cancer Father 65       died age 53 from colon cancer spreading per pt    Breast cancer Maternal Aunt    Colon cancer Paternal Aunt    Stomach cancer Paternal Aunt    Lung cancer Paternal Aunt    Rectal cancer Paternal Aunt    Stomach cancer Paternal Uncle    Colon polyps Neg Hx    Esophageal cancer Neg Hx     Social History   Socioeconomic History   Marital status: Married    Spouse name: Water quality scientist Watkinson   Number of children: 2   Years of education: 12   Highest education level: Not on file  Occupational History   Occupation: Disabled    Employer: [employer]  Tobacco Use   Smoking status: Some Days    Packs/day: 1.50    Years: 43.00    Pack years: 64.50    Types: Cigarettes   Smokeless tobacco: Never    Tobacco comments:    1-2 per day  Vaping Use   Vaping Use: Some days   Substances: Nicotine, Flavoring  Substance and Sexual Activity   Alcohol use: Yes    Comment: rarely  Drug use: No   Sexual activity: Yes  Other Topics Concern   Not on file  Social History Narrative   Lives w/ husband Oneida Arenas Hemric   Caffeine use: 2 cups coffee per day   Tea-3-4 drinks   Right handed    Social Determinants of Health   Financial Resource Strain: Low Risk    Difficulty of Paying Living Expenses: Not hard at all  Food Insecurity: No Food Insecurity   Worried About Charity fundraiser in the Last Year: Never true   Ran Out of Food in the Last Year: Never true  Transportation Needs: No Transportation Needs   Lack of Transportation (Medical): No   Lack of Transportation (Non-Medical): No  Physical Activity: Not on file  Stress: Stress Concern Present   Feeling of Stress : Very much  Social Connections: Moderately Isolated   Frequency of Communication with Friends and Family: More than three times a week   Frequency of Social Gatherings with Friends and Family: More than three times a week   Attends Religious Services: Never   Marine scientist or Organizations: No   Attends Music therapist: Never   Marital Status: Married  Human resources officer Violence: Not on file    Review of Systems:    Constitutional: No weight loss, fever or chills Cardiovascular: No chest pain  Respiratory: No SOB  Gastrointestinal: See HPI and otherwise negative   Physical Exam:  Vital signs: BP 100/62 (BP Location: Left Arm, Patient Position: Sitting, Cuff Size: Normal)    Pulse 64    Ht 5' 6.25" (1.683 m)    Wt 174 lb 8 oz (79.2 kg)    BMI 27.95 kg/m    Constitutional:   Pleasant Caucasian female appears to be in NAD, Well developed, Well nourished, alert and cooperative Respiratory: Respirations even and unlabored. Lungs clear to auscultation bilaterally.   No wheezes, crackles, or  rhonchi.  Cardiovascular: Normal S1, S2. No MRG. Regular rate and rhythm. No peripheral edema, cyanosis or pallor.  Gastrointestinal:  Soft, nondistended, moderate epigastric ttp. No rebound or guarding. Normal bowel sounds. No appreciable masses or hepatomegaly. Rectal:  Not performed.  Psychiatric: Demonstrates good judgement and reason without abnormal affect or behaviors.  RELEVANT LABS AND IMAGING: CBC    Component Value Date/Time   WBC 8.2 02/14/2021 1516   RBC 4.47 02/14/2021 1516   HGB 14.1 02/14/2021 1516   HCT 42.9 02/14/2021 1516   PLT 166.0 02/14/2021 1516   MCV 96.1 02/14/2021 1516   MCH 30.8 12/20/2019 1214   MCHC 32.8 02/14/2021 1516   RDW 12.9 02/14/2021 1516   LYMPHSABS 3.4 02/14/2021 1516   MONOABS 0.5 02/14/2021 1516   EOSABS 0.0 02/14/2021 1516   BASOSABS 0.0 02/14/2021 1516    CMP     Component Value Date/Time   NA 139 02/14/2021 1516   K 3.7 02/14/2021 1516   CL 104 02/14/2021 1516   CO2 28 02/14/2021 1516   GLUCOSE 119 (H) 02/14/2021 1516   BUN 11 02/14/2021 1516   CREATININE 0.79 02/14/2021 1516   CALCIUM 9.5 02/14/2021 1516   PROT 7.3 07/15/2020 1348   ALBUMIN 4.4 07/15/2020 1348   AST 27 07/15/2020 1348   ALT 28 07/15/2020 1348   ALKPHOS 77 07/15/2020 1348   BILITOT 0.4 07/15/2020 1348   GFRNONAA >60 12/20/2019 1214   GFRAA >60 05/14/2019 1457    Assessment: 1.  Epigastric discomfort: With reflux while sleeping, could be related to change  in position due to some back pain as well as Rybelsus and gastritis 2.  GERD: With above 3.  Constipation: Over the past few weeks, consider relation to medications  Plan: 1.  Discussed with patient that all the things that she is having happen could be from her Rybelsus.  Recommend she discuss this further with her PCP as it does not sound like it is working for her anymore.  She has in fact gained 20 pounds which is also contributing to symptoms. 2.  For now told patient to stop her Omeprazole which  she has been on for years and prescribed Pantoprazole 40 mg twice daily, 30-60 minutes before breakfast and dinner.  #60 with 5 refills. 3.  Recommend the patient start taking MiraLAX on a daily basis.  Discussed titration of this. 4.  Patient will call and let us know how she is doing in a couple of weeks and after discussion with PCP.  If symptoms continue would like her to come back to clinic in 4 to 6 weeks.  Ellouise Newer, PA-C [facility] 03/15/2021, 9:16 AM  Cc: Janith Lima, MD

## 2021-03-15 NOTE — Progress Notes (Signed)
Subjective:  Patient ID: Tasha [name], female    DOB: [birth year]  Age: 61 y.o. MRN: [id number]  CC: Diabetes  This visit occurred during the SARS-CoV-2 public health emergency.  Safety protocols were in place, including screening questions prior to the visit, additional usage of staff PPE, and extensive cleaning of exam room while observing appropriate contact time as indicated for disinfecting solutions.    HPI Tasha [name] presents for f/up -  She thinks Rybelsus is causing GI upset and she would like to switch to a different agent for blood sugar control.  She also complains that her current antidepressant is causing weight gain and she would like to switch back to Trintellix.    Outpatient Medications Prior to Visit  Medication Sig Dispense Refill   albuterol (VENTOLIN HFA) 108 (90 Base) MCG/ACT inhaler Inhale 2 puffs into the lungs every 6 (six) hours as needed for wheezing or shortness of breath. 8.5 g 0   ALPRAZolam (XANAX) 1 MG tablet TAKE 1 TABLET BY MOUTH 3 TIMES DAILY AS NEEDED FOR ANXIETY. 90 tablet 5   buPROPion (WELLBUTRIN XL) 150 MG 24 hr tablet Take 1 tablet by mouth daily. 90 tablet 1   ezetimibe (ZETIA) 10 MG tablet TAKE 1 TABLET BY MOUTH DAILY. 90 tablet 1   levothyroxine (SYNTHROID) 137 MCG tablet Take 1 tablet (137 mcg total) by mouth daily before breakfast. 90 tablet 1   omega-3 acid ethyl esters (LOVAZA) 1 g capsule Take 2 capsules by mouth 2 (two) times daily.     pantoprazole (PROTONIX) 40 MG tablet Take 1 tablet by mouth 2 (two) times daily before a meal. 60 tablet 5   pioglitazone (ACTOS) 15 MG tablet TAKE 1 TABLET BY MOUTH DAILY 90 tablet 1   potassium chloride SA (KLOR-CON M) 20 MEQ tablet TAKE 1 TABLET BY MOUTH 2 TIMES DAILY. 180 tablet 0   meloxicam (MOBIC) 15 MG tablet Take 1 tablet (15 mg total) by mouth daily. 30 tablet 0   pravastatin (PRAVACHOL) 10 MG tablet TAKE 1 TABLET BY MOUTH ONCE A DAY 90 tablet 1   Semaglutide (RYBELSUS) 7 MG TABS Take 1  tablet by mouth daily.     sertraline (ZOLOFT) 100 MG tablet TAKE 2 TABLETS BY MOUTH DAILY 90 tablet 0   No facility-administered medications prior to visit.    ROS Review of Systems  Constitutional:  Positive for unexpected weight change. Negative for chills, diaphoresis and fatigue.  HENT: Negative.    Eyes: Negative.   Respiratory:  Negative for cough, shortness of breath and wheezing.   Cardiovascular:  Negative for chest pain and palpitations.  Gastrointestinal:  Positive for constipation and nausea. Negative for abdominal pain, diarrhea and vomiting.  Endocrine: Negative.   Genitourinary: Negative.  Negative for difficulty urinating, dysuria and hematuria.  Musculoskeletal:  Positive for back pain. Negative for arthralgias and myalgias.  Skin: Negative.   Neurological:  Negative for dizziness, weakness and light-headedness.  Hematological:  Negative for adenopathy. Does not bruise/bleed easily.  Psychiatric/Behavioral:  Positive for dysphoric mood and sleep disturbance. Negative for confusion, decreased concentration, self-injury and suicidal ideas. The patient is nervous/anxious.    Objective:  BP 116/60 (BP Location: Left Arm, Patient Position: Sitting, Cuff Size: Large)    Pulse 78    Temp 98.7 F (37.1 C) (Oral)    Ht 5\' 6"  (1.676 m)    Wt 170 lb (77.1 kg)    SpO2 96%    BMI 27.44 kg/m   BP  Readings from Last 3 Encounters:  03/15/21 116/60  03/15/21 100/62  02/14/21 124/80    Wt Readings from Last 3 Encounters:  03/15/21 170 lb (77.1 kg)  03/15/21 174 lb 8 oz (79.2 kg)  02/28/21 175 lb (79.4 kg)    Physical Exam Vitals reviewed.  HENT:     Nose: Nose normal.     Mouth/Throat:     Mouth: Mucous membranes are moist.  Eyes:     General: No scleral icterus.    Conjunctiva/sclera: Conjunctivae normal.  Cardiovascular:     Rate and Rhythm: Normal rate and regular rhythm.     Pulses: Normal pulses.     Heart sounds: No murmur heard. Pulmonary:     Effort:  Pulmonary effort is normal.     Breath sounds: No stridor. No wheezing, rhonchi or rales.  Abdominal:     General: Abdomen is flat.     Palpations: There is no mass.     Tenderness: There is no abdominal tenderness. There is no guarding.     Hernia: No hernia is present.  Musculoskeletal:     Cervical back: Neck supple.     Right lower leg: No edema.     Left lower leg: No edema.  Lymphadenopathy:     Cervical: No cervical adenopathy.  Skin:    General: Skin is warm.     Findings: No lesion.  Neurological:     General: No focal deficit present.     Mental Status: She is alert and oriented to person, place, and time.  Psychiatric:        Mood and Affect: Mood normal.        Behavior: Behavior normal.        Thought Content: Thought content normal.        Judgment: Judgment normal.    Lab Results  Component Value Date   WBC 8.2 02/14/2021   HGB 14.1 02/14/2021   HCT 42.9 02/14/2021   PLT 166.0 02/14/2021   GLUCOSE 119 (H) 02/14/2021   CHOL 213 (H) 02/14/2021   TRIG 219.0 (H) 02/14/2021   HDL 47.60 02/14/2021   LDLDIRECT 140.0 02/14/2021   LDLCALC 107 (H) 12/23/2019   ALT 28 07/15/2020   AST 27 07/15/2020   NA 139 02/14/2021   K 3.7 02/14/2021   CL 104 02/14/2021   CREATININE 0.79 02/14/2021   BUN 11 02/14/2021   CO2 28 02/14/2021   TSH 1.07 02/14/2021   INR 1.0 01/27/2019   HGBA1C 6.8 (H) 02/14/2021   MICROALBUR <0.7 07/15/2020    MR Lumbar Spine w/o contrast  Result Date: 03/09/2021 CLINICAL DATA:  Low back pain following fall, radiating down both legs, left buttock pain worse than right EXAM: MRI LUMBAR SPINE WITHOUT CONTRAST TECHNIQUE: Multiplanar, multisequence MR imaging of the lumbar spine was performed. No intravenous contrast was administered. COMPARISON:  Lumbar spine radiographs 02/28/2021, CT abdomen/pelvis 02/15/2021 FINDINGS: Segmentation: Standard; the lowest formed disc space is designated L5-S1 Alignment:  Normal. Vertebrae: Vertebral body heights  are preserved, without evidence of acute injury. There is no suspicious marrow signal abnormality. There is no marrow edema. Conus medullaris and cauda equina: Conus extends to the L1-L2 level. Conus and cauda equina appear normal. Paraspinal and other soft tissues: Unremarkable. Disc levels: There is mild disc space narrowing at L5-S1. The other disc heights are overall preserved. There is multilevel facet arthropathy in the lower lumbar spine, most advanced at L4-L5 T12-L1: No significant spinal canal or neural foraminal stenosis L1-L2: No  significant spinal canal or neural foraminal stenosis L2-L3: Mild facet arthropathy without significant spinal canal or neural foraminal stenosis L3-L4: Mild facet arthropathy without significant spinal canal or neural foraminal stenosis. L4-L5: There is a mild disc bulge, moderate bilateral facet arthropathy, and ligamentum flavum thickening resulting in mild narrowing of the subarticular zones without evidence of nerve root impingement, and no significant neural foraminal stenosis. L5-S1: There is a left subarticular zone protrusion and mild bilateral facet arthropathy resulting in effacement of the left subarticular zone with impingement of the traversing S1 nerve root. There is no significant neural foraminal stenosis. IMPRESSION: 1. Left subarticular zone protrusion at L5-S1 with impingement of the traversing S1 nerve root. 2. Facet arthropathy most advanced at L4-L5 contributes to narrowing of the subarticular zones without evidence of nerve root impingement. 3. Mild degenerative changes at the remaining levels without other significant spinal canal or neural foraminal stenosis. Electronically Signed   By: Valetta Mole M.D.   On: 03/09/2021 08:24    Assessment & Plan:   Tasha [name] was seen today for diabetes.  Diagnoses and all orders for this visit:  Type II diabetes mellitus with manifestations ([place])- Will see if she can tolerate an injectable GLP/GIP agonist. -      Discontinue: empagliflozin (JARDIANCE) 10 MG TABS tablet; Take 1 tablet (10 mg total) by mouth daily before breakfast. -     tirzepatide (MOUNJARO) 2.5 MG/0.5ML Pen; Inject 2.5 mg into the skin once a week.  Hyperlipidemia with target LDL less than 130- She has not achieved her LDL goal.  I have asked her to be more compliant with the statin. -     pravastatin (PRAVACHOL) 10 MG tablet; TAKE 1 TABLET BY MOUTH ONCE A DAY  Severe episode of recurrent major depressive disorder, with psychotic features ([place])- Will taper down her sertraline dose and start Trintellix. -     vortioxetine HBr (TRINTELLIX) 5 MG TABS tablet; Take 1 tablet by mouth daily. -     sertraline (ZOLOFT) 50 MG tablet; Take 1 tablet by mouth daily.   I have discontinued Tasha [name] "Kim"'s sertraline, meloxicam, Rybelsus, and empagliflozin. I am also having her start on vortioxetine HBr, sertraline, and tirzepatide. Additionally, I am having her maintain her albuterol, buPROPion, ezetimibe, pioglitazone, potassium chloride SA, ALPRAZolam, levothyroxine, omega-3 acid ethyl esters, pantoprazole, and pravastatin.  Meds ordered this encounter  Medications   pravastatin (PRAVACHOL) 10 MG tablet    Sig: TAKE 1 TABLET BY MOUTH ONCE A DAY    Dispense:  90 tablet    Refill:  1   DISCONTD: empagliflozin (JARDIANCE) 10 MG TABS tablet    Sig: Take 1 tablet (10 mg total) by mouth daily before breakfast.    Dispense:  90 tablet    Refill:  0   vortioxetine HBr (TRINTELLIX) 5 MG TABS tablet    Sig: Take 1 tablet by mouth daily.    Dispense:  30 tablet    Refill:  0   sertraline (ZOLOFT) 50 MG tablet    Sig: Take 1 tablet by mouth daily.    Dispense:  30 tablet    Refill:  0   tirzepatide (MOUNJARO) 2.5 MG/0.5ML Pen    Sig: Inject 2.5 mg into the skin once a week.    Dispense:  2 mL    Refill:  0     Follow-up: Return in about 3 months (around 06/12/2021).  [name], MD

## 2021-03-15 NOTE — Patient Instructions (Signed)
RECOMMENDATIONS:  Your next colonoscopy is not due until 06/23/2022. Talk to your doctor about the Rybelsus. Start Miralax- Dissolve one capful in 8 ounces of water and drink before bed. STOP omeprazole. Start pantoprazole 40 MG, take 1 twice a day. We have sent this to your pharmacy.  BMI:  If you are age 62 or older, your body mass index should be between 23-30. Your Body mass index is 27.95 kg/m. If this is out of the aforementioned range listed, please consider follow up with your Primary Care Provider.  If you are age 23 or younger, your body mass index should be between 19-25. Your Body mass index is 27.95 kg/m. If this is out of the aformentioned range listed, please consider follow up with your Primary Care Provider.   MY CHART:  The [facility] providers would like to encourage you to use [facility] to communicate with providers for non-urgent requests or questions.  Due to long hold times on the telephone, sending your provider a message by [facility] may be a faster and more efficient way to get a response.  Please allow 48 business hours for a response.  Please remember that this is for non-urgent requests.   Thank you for trusting me with your gastrointestinal care!    Ellouise Newer, [place]

## 2021-03-17 IMAGING — MG DIGITAL SCREENING BILAT W/ CAD
4 series · 4 of 4 positions shown · non-contrast
Comparison: Previous exam(s).

CLINICAL DATA: Screening.

EXAM:
DIGITAL SCREENING BILATERAL MAMMOGRAM WITH CAD

[R CC]
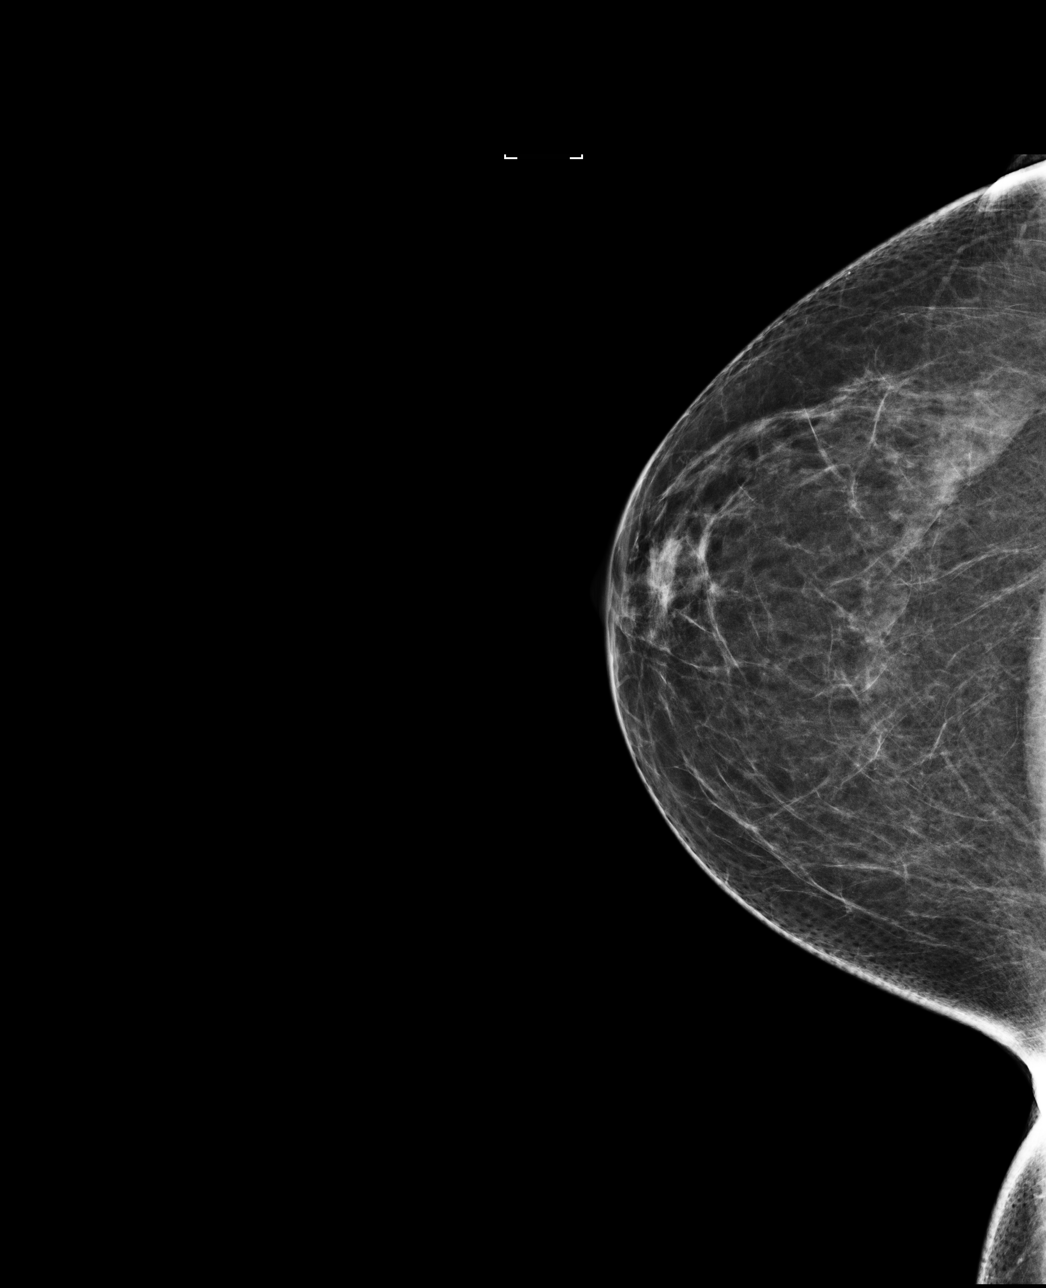

[L MLO]
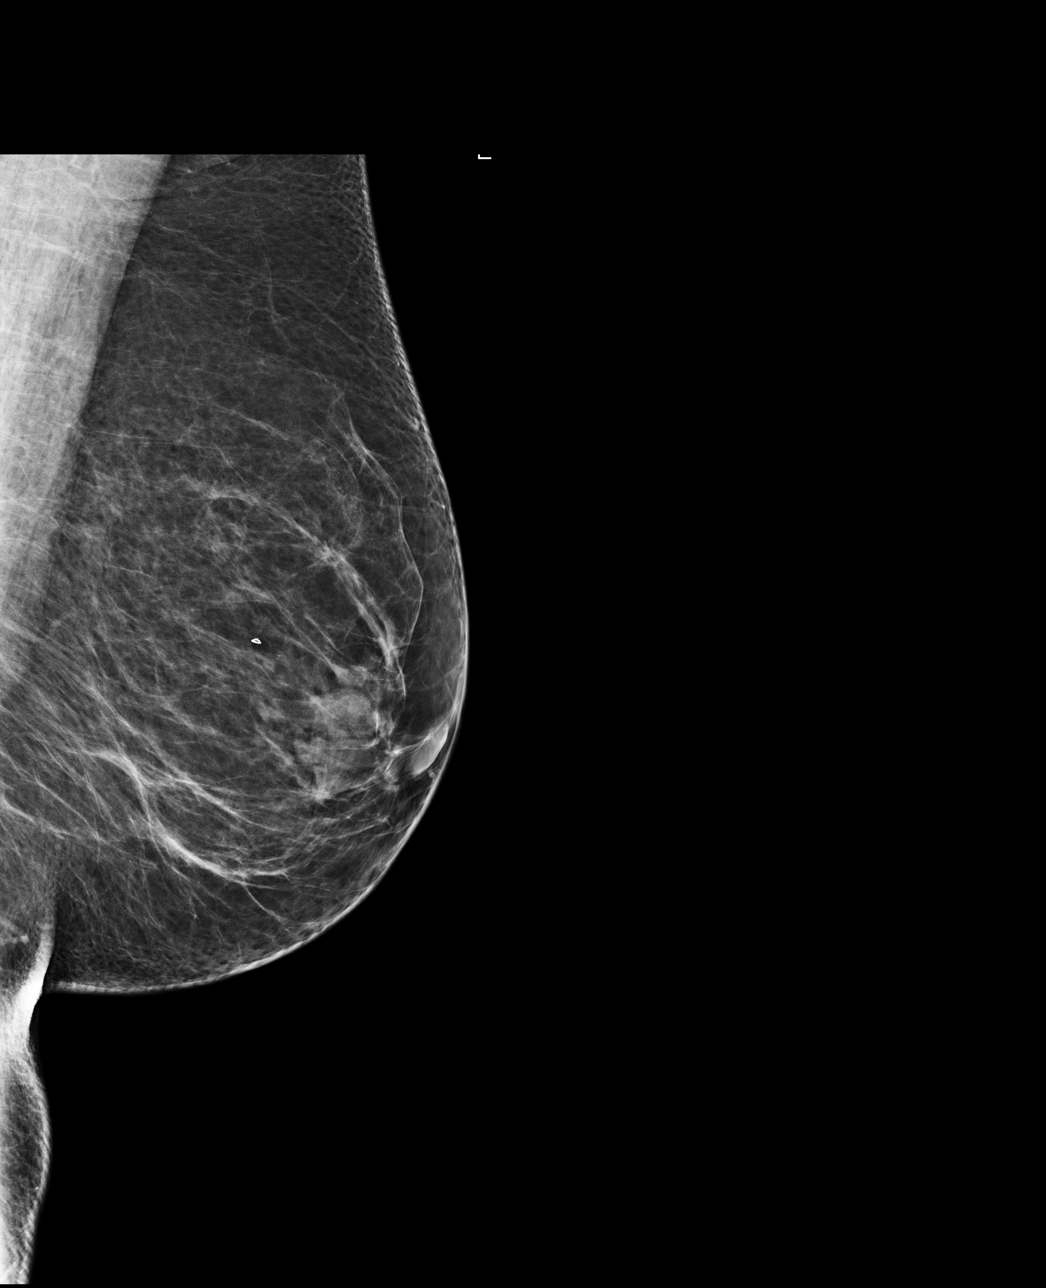

[L CC]
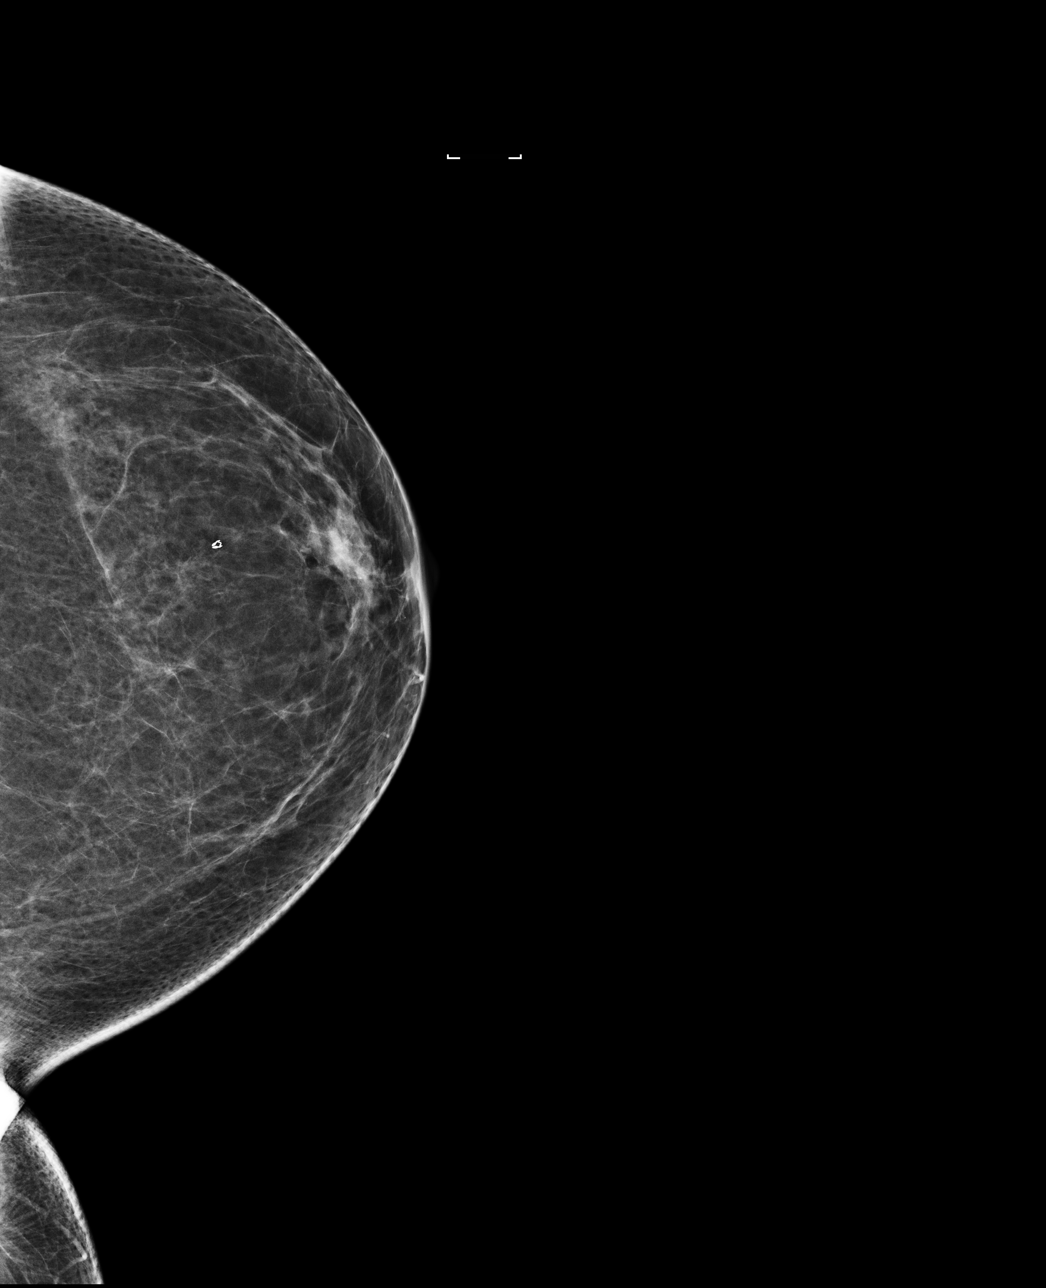

[R MLO]
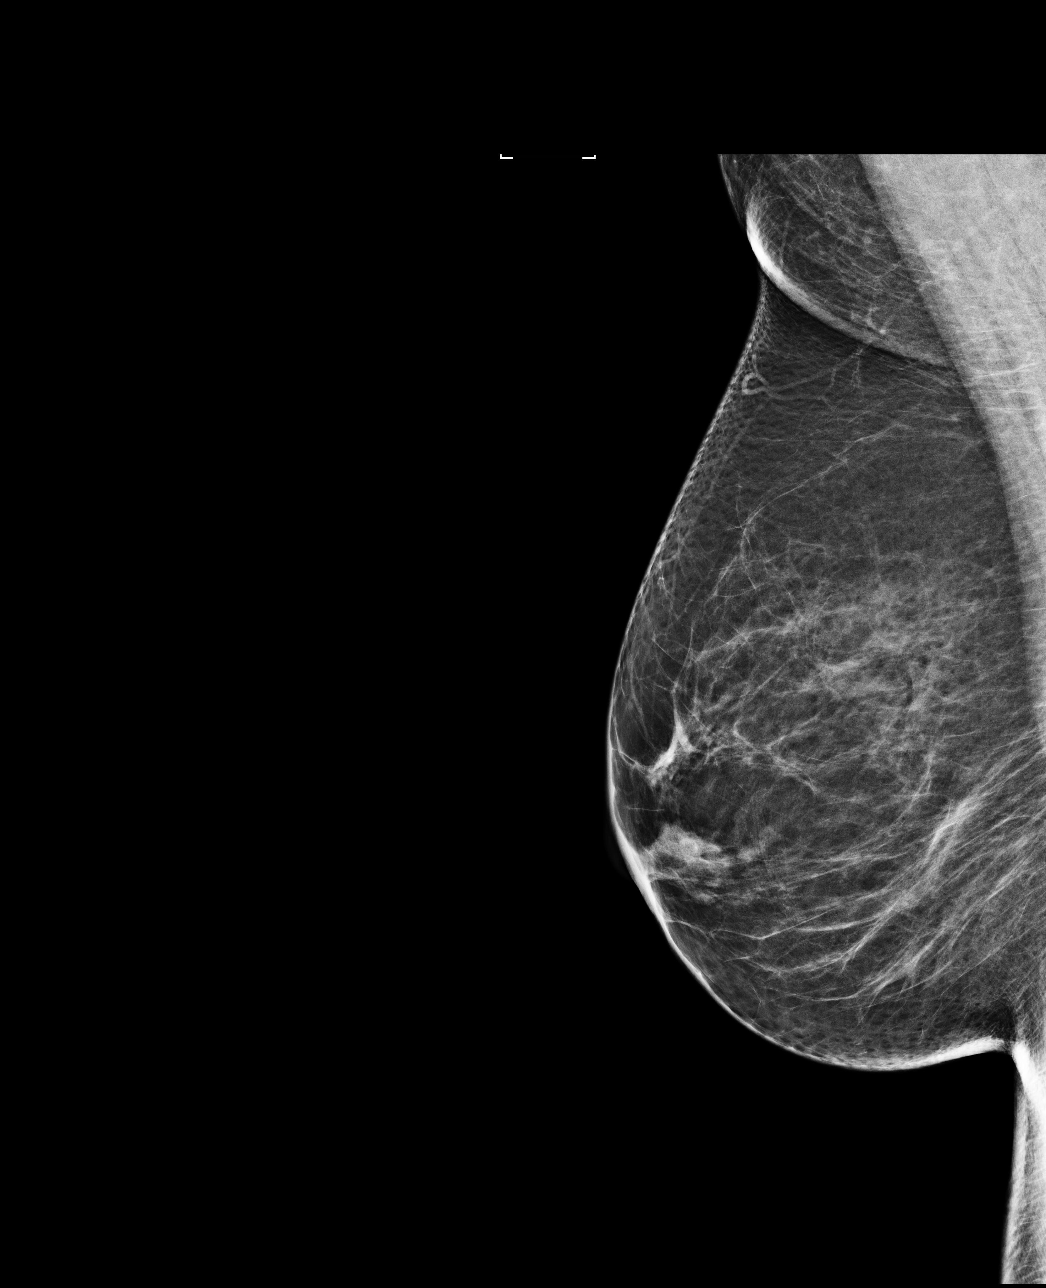

[4 of 4 positions shown; findings below may reference images not displayed]

ACR Breast Density Category b: There are scattered areas of
fibroglandular density.
FINDINGS: There are no findings suspicious for malignancy. Images were
processed with CAD.
IMPRESSION: No mammographic evidence of malignancy. A result letter of this
screening mammogram will be mailed directly to the patient.

RECOMMENDATION:
Screening mammogram in one year. (Code:AS-G-LCT)

BI-RADS CATEGORY  1: Negative.

## 2021-03-21 ENCOUNTER — Encounter: Payer: Self-pay | Admitting: Emergency Medicine

## 2021-03-21 ENCOUNTER — Other Ambulatory Visit: Payer: Self-pay

## 2021-03-21 ENCOUNTER — Ambulatory Visit
Admission: EM | Admit: 2021-03-21 | Discharge: 2021-03-21 | Disposition: A | Discharge status: Home / Self Care | Payer: No Typology Code available for payment source | Attending: Physician Assistant | Admitting: Physician Assistant

## 2021-03-21 ENCOUNTER — Telehealth: Payer: Self-pay | Admitting: Emergency Medicine

## 2021-03-21 DIAGNOSIS — H1033 Unspecified acute conjunctivitis, bilateral: Secondary | ICD-10-CM | POA: Diagnosis not present

## 2021-03-21 MED ORDER — POLYMYXIN B-TRIMETHOPRIM 10000-0.1 UNIT/ML-% OP SOLN: 1.0000 [drp] | OPHTHALMIC | 0 refills | Status: AC

## 2021-03-21 MED ORDER — POLYMYXIN B-TRIMETHOPRIM 10000-0.1 UNIT/ML-% OP SOLN: 1.0000 [drp] | OPHTHALMIC | 0 refills | Status: DC

## 2021-03-21 NOTE — ED Provider Notes (Signed)
[name]    CSN: [id number] Arrival date & time: 03/21/21  1634      History   Chief Complaint Chief Complaint  Patient presents with   Conjunctivitis    HPI Tasha [name] is a 61 y.o. female.   Patient here today for evaluation of pink eye she has had the last 2 days. Symptoms started to left eye and have seemed to cross into right. She has had some itching and drainage. Grandchild has similar symptoms. She denies any other symptoms.  The history is provided by the patient.  Conjunctivitis   Past Medical History:  Diagnosis Date   Allergy    Anemia    past hx    Anxiety state, unspecified    Arthralgia of temporomandibular joint    Arthritis    Asthma    as a child , not now per pt    Conversion disorder    Dementia ([place])    Depressive disorder, not elsewhere classified    Diabetes mellitus without complication ([place])    off all medicines as of 04-16-2019 PV    Dysphagia, unspecified(787.20)    Endometriosis    Esophageal reflux    Fibromyalgia    Headache(784.0)    Heart murmur    past hx murmur    Hx of adenomatous colonic polyps    Hypertension 07/18/2020   Irritable bowel syndrome    Mitral valve disorders(424.0)    Myalgia and myositis, unspecified    Overweight(278.02)    Periodic limb movement disorder    Pure hypercholesterolemia    RLS (restless legs syndrome)    Seizures ([place])    last episode 5 yrs ago    Stroke [facility])    TIA per pt many years ago    Unspecified hypothyroidism     Patient Active Problem List   Diagnosis Date Noted   Chronic bilateral low back pain with bilateral sciatica 02/24/2021   Need for shingles vaccine 02/14/2021   Chronic idiopathic constipation 02/14/2021   Hypertension 07/18/2020   Type II diabetes mellitus with manifestations ([place]) 07/15/2020   Pain due to onychomycosis of nail 07/15/2020   Mucopurulent chronic bronchitis ([place]) 07/15/2020   Tobacco abuse 07/15/2020   Encounter for general  adult medical examination with abnormal findings 05/01/2020   Common bile duct dilation 01/09/2020   Thrombocytopenia ([place]) 12/23/2019   Severe episode of recurrent major depressive disorder, with psychotic features ([place]) 10/08/2019   Hyperglyceridemia, pure 08/07/2018   Polyp of colon 07/25/2018   Esophageal dysphagia 07/25/2018   HIV antibody positive ([place]) 09/27/2017   Mild intermittent asthma without complication 40/98/1191   Primary insomnia 04/10/2016   Extremity atherosclerosis with intermittent claudication ([place]) 04/10/2016   Routine general medical examination at a health care facility 12/20/2014   Visit for screening mammogram 12/16/2014   Cervical cancer screening 12/16/2014   Primary osteoarthritis of both knees 06/22/2014   Migraine without aura and with status migrainosus, not intractable 02/03/2014   Nonspecific abnormal electrocardiogram (ECG) (EKG) 12/01/2013   Estrogen deficiency 05/20/2013   Vitamin D deficiency 04/23/2013   Hyperlipidemia with target LDL less than 130 09/08/2007   Periodic limb movement disorder 09/08/2007   CIGARETTE SMOKER 09/04/2007   Hypothyroidism 11/22/2006   Depression with anxiety 11/22/2006   GERD 11/22/2006   IBS 47/82/9562   Nonalcoholic steatohepatitis ([place]) 11/22/2006    Past Surgical History:  Procedure Laterality Date   ABDOMINAL HYSTERECTOMY     TAH   arm surgery  COLONOSCOPY  04/14/2008   FOOT SURGERY     LIPOMA EXCISION     BESIDES BELLY BUTTON   POLYPECTOMY     HPP x 1   TONSILLECTOMY     UPPER GASTROINTESTINAL ENDOSCOPY  2010   [facility]    WRIST SURGERY      OB History     Gravida  5   Para  2   Term      Preterm      AB  3   Living  2      SAB  2   IAB      Ectopic  1   Multiple      Live Births               Home Medications    Prior to Admission medications   Medication Sig Start Date End Date Taking? Authorizing Provider  trimethoprim-polymyxin b (POLYTRIM) ophthalmic  solution Place 1 drop into both eyes every 4 (four) hours for 7 days. 03/21/21 03/28/21 Yes Francene Finders, PA-C  albuterol (VENTOLIN HFA) 108 (90 Base) MCG/ACT inhaler Inhale 2 puffs into the lungs every 6 (six) hours as needed for wheezing or shortness of breath. 09/02/20   Lucretia Kern, DO  ALPRAZolam (XANAX) 1 MG tablet TAKE 1 TABLET BY MOUTH 3 TIMES DAILY AS NEEDED FOR ANXIETY. 11/16/20 05/15/21  Janith Lima, MD  buPROPion (WELLBUTRIN XL) 150 MG 24 hr tablet Take 1 tablet by mouth daily. 11/02/20   Janith Lima, MD  ezetimibe (ZETIA) 10 MG tablet TAKE 1 TABLET BY MOUTH DAILY. 11/02/20 11/02/21  Janith Lima, MD  levothyroxine (SYNTHROID) 137 MCG tablet Take 1 tablet (137 mcg total) by mouth daily before breakfast. 02/23/21   Janith Lima, MD  omega-3 acid ethyl esters (LOVAZA) 1 g capsule Take 2 capsules by mouth 2 (two) times daily.    [provider]  pantoprazole (PROTONIX) 40 MG tablet Take 1 tablet by mouth 2 (two) times daily before a meal. 03/15/21   Levin Erp, PA  pioglitazone (ACTOS) 15 MG tablet TAKE 1 TABLET BY MOUTH DAILY 11/02/20 11/02/21  Janith Lima, MD  potassium chloride SA (KLOR-CON M) 20 MEQ tablet TAKE 1 TABLET BY MOUTH 2 TIMES DAILY. 11/02/20 11/02/21  Janith Lima, MD  pravastatin (PRAVACHOL) 10 MG tablet TAKE 1 TABLET BY MOUTH ONCE A DAY 03/15/21 03/15/22  Janith Lima, MD  sertraline (ZOLOFT) 50 MG tablet Take 1 tablet by mouth daily. 03/15/21   Janith Lima, MD  tirzepatide [facility]) 2.5 MG/0.5ML Pen Inject 2.5 mg into the skin once a week. 03/15/21   Janith Lima, MD  vortioxetine HBr (TRINTELLIX) 5 MG TABS tablet Take 1 tablet by mouth daily. 03/15/21   Janith Lima, MD  ARIPiprazole (ABILIFY) 2 MG tablet Take 1 tablet (2 mg total) by mouth daily. 10/10/19 02/27/20  Janith Lima, MD  divalproex (DEPAKOTE) 500 MG DR tablet Take 1 tablet (500 mg total) by mouth 2 (two) times daily. 10/10/19 02/27/20  Janith Lima, MD    Family  History Family History  Problem Relation Age of Onset   Heart disease Mother    Heart disease Father    Diabetes Father    Colon cancer Father 64       died age 45 from colon cancer spreading per pt    Breast cancer Maternal Aunt    Colon cancer Paternal Aunt    Stomach cancer Paternal  Aunt    Lung cancer Paternal Aunt    Rectal cancer Paternal Aunt    Stomach cancer Paternal Uncle    Colon polyps Neg Hx    Esophageal cancer Neg Hx     Social History Social History   Tobacco Use   Smoking status: Some Days    Packs/day: 1.50    Years: 43.00    Pack years: 64.50    Types: Cigarettes   Smokeless tobacco: Never   Tobacco comments:    1-2 per day  Vaping Use   Vaping Use: Some days   Substances: Nicotine, Flavoring  Substance Use Topics   Alcohol use: Yes    Comment: rarely   Drug use: No     Allergies   Farxiga [dapagliflozin], Metformin and related, Semaglutide(0.25 or 0.5mg -dos), Statins, Augmentin [amoxicillin-pot clavulanate], Crestor [rosuvastatin], Lipitor [atorvastatin], and Zocor [simvastatin]   Review of Systems Review of Systems  Constitutional:  Negative for chills and fever.  HENT:  Negative for congestion and rhinorrhea.   Eyes:  Positive for discharge and redness.  Respiratory:  Negative for cough.   Gastrointestinal:  Negative for nausea and vomiting.    Physical Exam Triage Vital Signs ED Triage Vitals  Enc Vitals Group     BP 03/21/21 1648 131/72     Pulse Rate 03/21/21 1648 60     Resp 03/21/21 1648 16     Temp 03/21/21 1648 97.7 F (36.5 C)     Temp Source 03/21/21 1648 Oral     SpO2 03/21/21 1648 96 %     Weight --      Height --      Head Circumference --      Peak Flow --      Pain Score 03/21/21 1646 4     Pain Loc --      Pain Edu? --      Excl. in [place]? --    No data found.  Updated Vital Signs BP 131/72 (BP Location: Left Arm)    Pulse 60    Temp 97.7 F (36.5 C) (Oral)    Resp 16    SpO2 96%      Physical  Exam Vitals and nursing note reviewed.  Constitutional:      General: She is not in acute distress.    Appearance: Normal appearance. She is not ill-appearing.  HENT:     Head: Normocephalic and atraumatic.  Eyes:     Comments: Bilateral conjunctiva injected- left worse than right  Cardiovascular:     Rate and Rhythm: Normal rate.  Pulmonary:     Effort: Pulmonary effort is normal. No respiratory distress.  Neurological:     Mental Status: She is alert.  Psychiatric:        Mood and Affect: Mood normal.        Behavior: Behavior normal.        Thought Content: Thought content normal.     UC Treatments / Results  Labs (all labs ordered are listed, but only abnormal results are displayed) Labs Reviewed - No data to display  EKG   Radiology No results found.  Procedures Procedures (including critical care time)  Medications Ordered in UC Medications - No data to display  Initial Impression / Assessment and Plan / UC Course  I have reviewed the triage vital signs and the nursing notes.  Pertinent labs & imaging results that were available during my care of the patient were reviewed by me and considered in  my medical decision making (see chart for details).    Antibiotic drop prescribed to cover conjunctivitis. Encouraged follow up with any further concerns.   Final Clinical Impressions(s) / UC Diagnoses   Final diagnoses:  Acute conjunctivitis of both eyes, unspecified acute conjunctivitis type   Discharge Instructions   None    ED Prescriptions     Medication Sig Dispense Auth. Provider   trimethoprim-polymyxin b (POLYTRIM) ophthalmic solution Place 1 drop into both eyes every 4 (four) hours for 7 days. 10 mL Francene Finders, PA-C      PDMP not reviewed this encounter.   Francene Finders, PA-C 03/21/21 1704

## 2021-03-21 NOTE — ED Triage Notes (Signed)
Bilateral pink eye x 2 days, pain and itching with drainage

## 2021-03-22 ENCOUNTER — Other Ambulatory Visit (HOSPITAL_COMMUNITY): Payer: Self-pay

## 2021-03-24 ENCOUNTER — Telehealth: Payer: Self-pay | Admitting: Physician Assistant

## 2021-03-24 ENCOUNTER — Other Ambulatory Visit (HOSPITAL_COMMUNITY): Payer: Self-pay

## 2021-03-24 MED ORDER — FAMOTIDINE 40 MG PO TABS
40.0000 mg | ORAL_TABLET | Freq: Two times a day (BID) | ORAL | 3 refills | Status: DC
  Filled 2021-03-24: qty 60, 30d supply, fill #0

## 2021-03-24 NOTE — Telephone Encounter (Signed)
Inbound call from patient stated that Protonix is not working for her and she is feeling worse. Seeking advice if there is something else that she can get. Please advise.

## 2021-03-28 ENCOUNTER — Ambulatory Visit: Payer: Self-pay

## 2021-03-28 ENCOUNTER — Ambulatory Visit: Payer: No Typology Code available for payment source | Admitting: *Deleted

## 2021-03-30 ENCOUNTER — Other Ambulatory Visit: Payer: Self-pay | Admitting: *Deleted

## 2021-03-30 NOTE — Patient Outreach (Signed)
[facility]) Care Management  03/30/2021 1500 PM  Tasha [name] 02-May-1960 [id number]   RN Health Coach telephone call to patient.  [employer] compliance verified. Patient has changed insurances to [facility] from [facility]. [employer] Benefits are not available for this plan.    [place] Management [phone]  PLAN: RN will verify if this plan has been added.  [place] Care Management [phone]

## 2021-03-30 NOTE — Patient Outreach (Addendum)
[facility]) Care Management  03/30/2021  JENIA KLEPPER [birth year] [id number]   RN Health Coach telephone call to patient.  [employer] compliance verified. Patient stated that her grand children were there and requested a call her back Friday in the am. RN has verified that [facility] now has [facility] benefits.  [place] Management [phone]  Plan: RN will follow up with outreach telephone assessment on Friday 02/24 at 9:30 am.

## 2021-04-01 ENCOUNTER — Other Ambulatory Visit: Payer: Self-pay | Admitting: *Deleted

## 2021-04-01 ENCOUNTER — Other Ambulatory Visit (HOSPITAL_COMMUNITY): Payer: Self-pay

## 2021-04-01 NOTE — Patient Outreach (Signed)
[facility]) Care Management [place] Note   04/01/2021 Name:  Tasha [name] MRN:  [id number] DOB:  25-May-1960  Summary: Patient A1C is 6.8. Her Goal A1C is 6.9. She does not monitor her blood sugar. Patient stated that she has some episodes of blurring of vision intermittently.  Per patient her appetite is good. She  has pain in her back radiating  down legs.  Recommendations/Changes made from today's visit: Patient will follow up with Dr regarding a blood glucose machine Provided educational material on hypo and hyperglycemia Provided educational material on appropriate amount for portion control  Subjective: Tasha [name] is an 61 y.o. year old female who is a primary patient of Janith Lima, MD. The care management team was consulted for assistance with care management and/or care coordination needs.    RN Health Coach completed Telephone Visit today.   Objective:  Medications Reviewed Today     Reviewed by Verlin Grills, RN (Case Manager) on 04/01/21 at 1038  Med List Status: <None>   Medication Order Taking? Sig Documenting Provider Last Dose Status Informant  albuterol (VENTOLIN HFA) 108 (90 Base) MCG/ACT inhaler [id number] Yes Inhale 2 puffs into the lungs every 6 (six) hours as needed for wheezing or shortness of breath. Lucretia Kern, DO Taking Active   ALPRAZolam [facility]anne [facility]on) 1 MG tablet [id number] Yes TAKE 1 TABLET BY MOUTH 3 TIMES DAILY AS NEEDED FOR ANXIETY. Janith Lima, MD Taking Active     Discontinued 02/27/20 1957   buPROPion (WELLBUTRIN XL) 150 MG 24 hr tablet [id number] Yes Take 1 tablet by mouth daily. Janith Lima, MD Taking Active     Discontinued 02/27/20 1957   ezetimibe (ZETIA) 10 MG tablet [id number] Yes TAKE 1 TABLET BY MOUTH DAILY. Janith Lima, MD Taking Active   famotidine (PEPCID) 40 MG tablet [id number] Yes Take 1 tablet (40 mg total) by mouth 2 (two) times daily. Levin Erp, [place] Taking Active    levothyroxine (SYNTHROID) 137 MCG tablet [id number] Yes Take 1 tablet (137 mcg total) by mouth daily before breakfast. Janith Lima, MD Taking Active   omega-3 acid ethyl esters (LOVAZA) 1 g capsule [id number]  Take 2 capsules by mouth 2 (two) times daily. [provider]  Active   pantoprazole (PROTONIX) 40 MG tablet [id number] Yes Take 1 tablet by mouth 2 (two) times daily before a meal. Levin Erp, PA Taking Active   pioglitazone (ACTOS) 15 MG tablet [id number] Yes TAKE 1 TABLET BY MOUTH DAILY Janith Lima, MD Taking Active   potassium chloride SA (KLOR-CON M) 20 MEQ tablet [id number] Yes TAKE 1 TABLET BY MOUTH 2 TIMES DAILY. Janith Lima, MD Taking Active   pravastatin (PRAVACHOL) 10 MG tablet [id number] Yes TAKE 1 TABLET BY MOUTH ONCE A DAY Janith Lima, MD Taking Active   sertraline (ZOLOFT) 50 MG tablet [id number] Yes Take 1 tablet by mouth daily. Janith Lima, MD Taking Active   tirzepatide [facility]) 2.5 MG/0.5ML Pen [id number] Yes Inject 2.5 mg into the skin once a week. Janith Lima, MD Taking Active   vortioxetine HBr (TRINTELLIX) 5 MG TABS tablet [id number] Yes Take 1 tablet by mouth daily. Janith Lima, MD Taking Active              SDOH:  (Social Determinants of Health) assessments and interventions performed:    Care Plan  Review of patient past medical history, allergies, medications, health status, including review of  consultants reports, laboratory and other test data, was performed as part of comprehensive evaluation for care management services.   Care Plan : Diabetes Type 2 (Adult)  Updates made by Verlin Grills, RN since 04/01/2021 12:00 AM     Problem: Disease Progression (Diabetes, Type 2) Resolved 04/01/2021  Note:   [id number] Resolving due to duplicate goal     Long-Range Goal: Disease Progression Prevented or Minimized as evidenced by A1c less than 7.0 Completed 04/01/2021  Start Date: 01/14/2020  Expected End Date:  05/06/2020  Recent Progress: On track  Priority: High  Note:   Evidence-based guidance:  Prepare patient for laboratory and diagnostic exams based on risk and presentation.  Encourage lifestyle changes, such as increased intake of plant-based foods, stress reduction, consistent physical activity and smoking cessation to prevent long-term complications and chronic disease.   Individualize activity and exercise recommendations while considering potential limitations, such as neuropathy, retinopathy or the ability to prevent hyperglycemia or hypoglycemia.   Prepare patient for use of pharmacologic therapy that may include antihypertensive, analgesic, prostaglandin E1 with periodic adjustments, based on presenting chronic condition and laboratory results.  Assess signs/symptoms and risk factors for hypertension, sleep-disordered breathing, neuropathy (including changes in gait and balance), retinopathy, nephropathy and sexual dysfunction.  Address pregnancy planning and contraceptive choice, especially when prescribing antihypertensive or statin.  Ensure completion of annual comprehensive foot exam and dilated eye exam.   Implement additional individualized goals and interventions based on identified risk factors.  Prepare patient for consultation or referral for specialist care, such as ophthalmology, neurology, cardiology, podiatry, nephrology or perinatology.   Notes:     Task: Monitor and Manage Follow-up for Comorbidities Completed 04/01/2021  Priority: Routine  Responsible User: Jon Billings, RN  Note:   Care Management Activities:    - healthy lifestyle promoted - signs/symptoms of comorbidities identified    Notes: 10/19/20 Patient reports A1c 7.4. Discussed diabetes management.   Diabetes Management Discussed: Medication adherence Reviewed importance of limiting carbs such as rice, potatoes, breads, and pastas. Also discussed limiting sweets and sugary drinks.  Discussed importance of  portion control.  Also discussed importance of annual exams, foot exams, and eye exams.      Care Plan : RN Care Manager Plan of Care  Updates made by Blaike Vickers, Eppie [place], RN since 04/01/2021 12:00 AM     Problem: Knowledge Deficit Related to Diabetes   Priority: High     Long-Range Goal: Development Plan of Care for Management of Diabetes   Start Date: 04/01/2021  Expected End Date: 02/04/2022  Priority: High  Note:   Current Barriers:  Knowledge Deficits related to plan of care for management of DMII   RNCM Clinical Goal(s):  Patient will verbalize understanding of plan for management of DMII as evidenced by continuation of monitoring blood sugars and adhering to diabetic diet  through collaboration with RN Care manager, provider, and care team.   Interventions: Inter-disciplinary care team collaboration (see longitudinal plan of care) Evaluation of current treatment plan related to  self management and patient's adherence to plan as established by provider   Diabetes Interventions:  (Status:  Goal on track:  Yes.) Long Term Goal Assessed patient's understanding of A1c goal: <7% Provided education to patient about basic DM disease process Reviewed medications with patient and discussed importance of medication adherence Discussed plans with patient for ongoing care management follow up and provided patient with direct contact information for care management team Provided patient with written educational materials related to  hypo and hyperglycemia and importance of correct treatment Lab Results  Component Value Date   HGBA1C 6.8 (H) 02/14/2021   Patient Goals/Self-Care Activities: Take all medications as prescribed Attend all scheduled provider appointments Call pharmacy for medication refills 3-7 days in advance of running out of medications Attend church or other social activities Perform all self care activities independently  Perform IADL's (shopping, preparing meals,  housekeeping, managing finances) independently Call provider office for new concerns or questions  call the Suicide and Crisis Lifeline: 988 if experiencing a Mental Health or [place]  check feet daily for cuts, sores or redness trim toenails straight across drink 6 to 8 glasses of water each day wash and dry feet carefully every day wear comfortable, cotton socks wear comfortable, well-fitting shoes Patient will discuss with Dr on getting a glucose monitor  Follow Up Plan:  Telephone follow up appointment with care management team member scheduled for:  May 2023 The patient has been provided with contact information for the care management team and has been advised to call with any health related questions or concerns.               Plan: Telephone follow up appointment with care management team member scheduled for:  May 2023 The patient has been provided with contact information for the care management team and has been advised to call with any health related questions or concerns.   [place] Care Management [phone]

## 2021-04-01 NOTE — Patient Instructions (Signed)
°   Visit Information  Thank you for taking time to visit with me today. Please don't hesitate to contact me if I can be of assistance to you before our next scheduled telephone appointment.  Following are the goals we discussed today:  Current Barriers:  Knowledge Deficits related to plan of care for management of DMII   RNCM Clinical Goal(s):  Patient will verbalize understanding of plan for management of DMII as evidenced by continuation of monitoring blood sugars and adhering to diabetic diet through collaboration with RN Care manager, provider, and care team.   Interventions: Inter-disciplinary care team collaboration (see longitudinal plan of care) Evaluation of current treatment plan related to  self management and patient's adherence to plan as established by provider   Diabetes Interventions:  (Status:  Goal on track:  Yes.) Long Term Goal Assessed patient's understanding of A1c goal: <7% Provided education to patient about basic DM disease process Reviewed medications with patient and discussed importance of medication adherence Discussed plans with patient for ongoing care management follow up and provided patient with direct contact information for care management team Provided patient with written educational materials related to hypo and hyperglycemia and importance of correct treatment Lab Results  Component Value Date   HGBA1C 6.8 (H) 02/14/2021    Patient Goals/Self-Care Activities: Take all medications as prescribed Attend all scheduled provider appointments Call pharmacy for medication refills 3-7 days in advance of running out of medications Attend church or other social activities Perform all self care activities independently  Perform IADL's (shopping, preparing meals, housekeeping, managing finances) independently Call provider office for new concerns or questions  call the Suicide and Crisis Lifeline: 988 if experiencing a Mental Health or [place]  check feet daily for cuts, sores or redness trim toenails straight across drink 6 to 8 glasses of water each day wash and dry feet carefully every day wear comfortable, cotton socks wear comfortable, well-fitting shoes Patient will discuss with Dr on getting a glucose monitor  Follow Up Plan:  Telephone follow up appointment with care management team member scheduled for:  May 2023 The patient has been provided with contact information for the care management team and has been advised to call with any health related questions or concerns.      Our next appointment is by telephone on May 2023  Please call Johny Shock at [phone]  if you need to cancel or reschedule your appointment.   Please call the Suicide and Crisis Lifeline: 988 if you are experiencing a Mental Health or [place] or need someone to talk to.  The patient verbalized understanding of instructions, educational materials, and care plan provided today and agreed to receive a mailed copy of patient instructions, educational materials, and care plan.   Telephone follow up appointment with care management team member scheduled for: The patient has been provided with contact information for the care management team and has been advised to call with any health related questions or concerns.   [place] Care Management [phone]

## 2021-04-08 ENCOUNTER — Emergency Department (HOSPITAL_BASED_OUTPATIENT_CLINIC_OR_DEPARTMENT_OTHER): Payer: No Typology Code available for payment source

## 2021-04-08 ENCOUNTER — Other Ambulatory Visit: Payer: Self-pay

## 2021-04-08 ENCOUNTER — Other Ambulatory Visit (HOSPITAL_COMMUNITY): Payer: Self-pay

## 2021-04-08 ENCOUNTER — Emergency Department (HOSPITAL_BASED_OUTPATIENT_CLINIC_OR_DEPARTMENT_OTHER)
Admission: EM | Admit: 2021-04-08 | Discharge: 2021-04-09 | Disposition: A | Discharge status: Home / Self Care | Payer: No Typology Code available for payment source | Attending: Emergency Medicine | Admitting: Emergency Medicine

## 2021-04-08 ENCOUNTER — Encounter (HOSPITAL_BASED_OUTPATIENT_CLINIC_OR_DEPARTMENT_OTHER): Payer: Self-pay | Admitting: *Deleted

## 2021-04-08 DIAGNOSIS — Z20822 Contact with and (suspected) exposure to covid-19: Secondary | ICD-10-CM | POA: Diagnosis not present

## 2021-04-08 DIAGNOSIS — Z5321 Procedure and treatment not carried out due to patient leaving prior to being seen by health care provider: Secondary | ICD-10-CM | POA: Insufficient documentation

## 2021-04-08 DIAGNOSIS — R079 Chest pain, unspecified: Secondary | ICD-10-CM | POA: Diagnosis not present

## 2021-04-08 DIAGNOSIS — R0781 Pleurodynia: Secondary | ICD-10-CM | POA: Diagnosis present

## 2021-04-08 DIAGNOSIS — F17200 Nicotine dependence, unspecified, uncomplicated: Secondary | ICD-10-CM | POA: Insufficient documentation

## 2021-04-08 DIAGNOSIS — R1011 Right upper quadrant pain: Secondary | ICD-10-CM | POA: Diagnosis present

## 2021-04-08 DIAGNOSIS — R7309 Other abnormal glucose: Secondary | ICD-10-CM | POA: Diagnosis not present

## 2021-04-08 DIAGNOSIS — R9431 Abnormal electrocardiogram [ECG] [EKG]: Secondary | ICD-10-CM | POA: Diagnosis not present

## 2021-04-08 DIAGNOSIS — Z5329 Procedure and treatment not carried out because of patient's decision for other reasons: Secondary | ICD-10-CM

## 2021-04-08 DIAGNOSIS — R0789 Other chest pain: Secondary | ICD-10-CM | POA: Diagnosis not present

## 2021-04-08 LAB — BASIC METABOLIC PANEL
Anion gap: 9 (ref 5–15)
BUN: 11 mg/dL (ref 8–23)
CO2: 26 mmol/L (ref 22–32)
Calcium: 9.2 mg/dL (ref 8.9–10.3)
Chloride: 102 mmol/L (ref 98–111)
Creatinine, Ser: 0.91 mg/dL (ref 0.44–1.00)
GFR, Estimated: >60 mL/min (ref >60)
Glucose, Bld: 130 mg/dL — ABNORMAL HIGH (ref 70–99)
Potassium: 3.7 mmol/L (ref 3.5–5.1)
Sodium: 137 mmol/L (ref 135–145)

## 2021-04-08 LAB — TROPONIN I (HIGH SENSITIVITY)
Troponin I (High Sensitivity): 2 ng/L (ref <18)
Troponin I (High Sensitivity): 2 ng/L (ref <18)

## 2021-04-08 LAB — HEPATIC FUNCTION PANEL
ALT: 35 U/L (ref 0–44)
AST: 30 U/L (ref 15–41)
Albumin: 4.3 g/dL (ref 3.5–5.0)
Alkaline Phosphatase: 75 U/L (ref 38–126)
Bilirubin, Direct: 0.1 mg/dL (ref 0.0–0.2)
Indirect Bilirubin: 0.5 mg/dL (ref 0.3–0.9)
Total Bilirubin: 0.6 mg/dL (ref 0.3–1.2)
Total Protein: 7.4 g/dL (ref 6.5–8.1)

## 2021-04-08 LAB — CBC
HCT: 42.8 % (ref 36.0–46.0)
Hemoglobin: 14.3 g/dL (ref 12.0–15.0)
MCH: 32.1 pg (ref 26.0–34.0)
MCHC: 33.4 g/dL (ref 30.0–36.0)
MCV: 96 fL (ref 80.0–100.0)
Platelets: 163 10*3/uL (ref 150–400)
RBC: 4.46 MIL/uL (ref 3.87–5.11)
RDW: 13.5 % (ref 11.5–15.5)
WBC: 7.9 10*3/uL (ref 4.0–10.5)
nRBC: 0 % (ref 0.0–0.2)

## 2021-04-08 LAB — RESP PANEL BY RT-PCR (FLU A&B, COVID) ARPGX2
Influenza A by PCR: NEGATIVE
Influenza B by PCR: NEGATIVE
SARS Coronavirus 2 by RT PCR: NEGATIVE

## 2021-04-08 LAB — URINALYSIS, ROUTINE W REFLEX MICROSCOPIC
Bilirubin Urine: NEGATIVE
Glucose, UA: NEGATIVE mg/dL
Hgb urine dipstick: NEGATIVE
Ketones, ur: NEGATIVE mg/dL
Leukocytes,Ua: NEGATIVE
Nitrite: NEGATIVE
Protein, ur: NEGATIVE mg/dL
Specific Gravity, Urine: 1.015 (ref 1.005–1.030)
pH: 7.5 (ref 5.0–8.0)

## 2021-04-08 LAB — LIPASE, BLOOD: Lipase: 34 U/L (ref 11–51)

## 2021-04-08 MED ORDER — LACTATED RINGERS IV BOLUS
1000.0000 mL | Freq: Once | INTRAVENOUS | Status: AC
  Administered 2021-04-08: 1000 mL via INTRAVENOUS

## 2021-04-08 NOTE — ED Notes (Signed)
Explained to Pt. That the [facility] was taking care of Pt.s in other rooms and she would be around to speak with her family member asap.  Explained that the [facility] was very busy with other Patients and her condition was stable and we were doing all we could for her here.  Pt. Is no having any pain while ER RN in room with her. ?

## 2021-04-08 NOTE — ED Notes (Signed)
Pt. Complains of every body part from her eyes to her legs.  She reports she has anxiety attacks  ?

## 2021-04-08 NOTE — ED Triage Notes (Signed)
Chest pain RUQ x 3 days. States she has had burping for months. She feels the pain is related to her gallbladder. EMS was called when the pain started but pt did not require transport.  ?

## 2021-04-08 NOTE — ED Notes (Signed)
Pt. Reports multiple complaints with multiple body parts.  Pt. Here today with reports of possible gallbladder issues per the Pt.  Pt. Reports upper R side pain. ? ?Pt. States she has had GERD issues and she is spitting up in her mouth. ?

## 2021-04-09 NOTE — ED Notes (Signed)
This RN just assumed care of pt, and plan was for transfer to [facility] for admission, pt agreed to plan and transfer. Within a few minutes, pt came to desk and said for staff to come take her IV out, that she wanted to leave. ?

## 2021-04-09 NOTE — ED Notes (Signed)
Pt's IV removed, pt verbalizes risks of leaving against medical advice. Seen walking out to lobby with husband ?

## 2021-04-09 NOTE — ED Provider Notes (Addendum)
[facility] Provider Note   CSN: [id number] Arrival date & time: 04/08/21  1456     History Chief Complaint  Patient presents with   Chest Pain    Tasha [name] is a 61 y.o. female with h/o esophageal strictures, GERD, IBS presents to the ED for evaluation of right sided chest pain/lower rib pain that radiates to her back for the past few months but has worsened over the past few days.  She reports that the pain is worse after eating fatty foods.  She repeatedly reports that it is her gallbladder causing problems. She reports she has had it tested before but does not remember the test or when the test was. She has tried her OTC omeprazole and Mylanta without much relief. She has been evaluated by her PCP and GI multiple times for this issue. She reports that the pain worsened after she swallowed a mini Oreo whole. Additionally,she reports she has been "spitting up clear/yellow foam for months". She mentions multiple other problems such as occasional shortness of breath "when she talks too much", constipation that she has been suffering from "all my life". She denies and fevers, URI symptoms, nausea, vomiting, diarrhea,. Recently treated for preseptal cellulitis from her eye doctor with Acyclovir, polydex, Keflex, and prednisone. She reports completion of these medications. She is a daily smoker. Denies any EtOH or illicit drug use.    Chest Pain Associated symptoms: abdominal pain       Home Medications Prior to Admission medications   Medication Sig Start Date End Date Taking? Authorizing Provider  albuterol (VENTOLIN HFA) 108 (90 Base) MCG/ACT inhaler Inhale 2 puffs into the lungs every 6 (six) hours as needed for wheezing or shortness of breath. 09/02/20   Lucretia Kern, DO  ALPRAZolam (XANAX) 1 MG tablet TAKE 1 TABLET BY MOUTH 3 TIMES DAILY AS NEEDED FOR ANXIETY. 11/16/20 05/15/21  Janith Lima, MD  buPROPion (WELLBUTRIN XL) 150 MG 24 hr tablet Take 1  tablet by mouth daily. 11/02/20   Janith Lima, MD  ezetimibe (ZETIA) 10 MG tablet TAKE 1 TABLET BY MOUTH DAILY. 11/02/20 11/02/21  Janith Lima, MD  famotidine (PEPCID) 40 MG tablet Take 1 tablet (40 mg total) by mouth 2 (two) times daily. 03/24/21   Levin Erp, PA  levothyroxine (SYNTHROID) 137 MCG tablet Take 1 tablet (137 mcg total) by mouth daily before breakfast. 02/23/21   Janith Lima, MD  omega-3 acid ethyl esters (LOVAZA) 1 g capsule Take 2 capsules by mouth 2 (two) times daily.    [provider]  pantoprazole (PROTONIX) 40 MG tablet Take 1 tablet by mouth 2 (two) times daily before a meal. 03/15/21   Levin Erp, PA  pioglitazone (ACTOS) 15 MG tablet TAKE 1 TABLET BY MOUTH DAILY 11/02/20 11/02/21  Janith Lima, MD  potassium chloride SA (KLOR-CON M) 20 MEQ tablet TAKE 1 TABLET BY MOUTH 2 TIMES DAILY. 11/02/20 11/02/21  Janith Lima, MD  pravastatin (PRAVACHOL) 10 MG tablet TAKE 1 TABLET BY MOUTH ONCE A DAY 03/15/21 03/15/22  Janith Lima, MD  sertraline (ZOLOFT) 50 MG tablet Take 1 tablet by mouth daily. 03/15/21   Janith Lima, MD  tirzepatide [facility]) 2.5 MG/0.5ML Pen Inject 2.5 mg into the skin once a week. 03/15/21   Janith Lima, MD  vortioxetine HBr (TRINTELLIX) 5 MG TABS tablet Take 1 tablet by mouth daily. 03/15/21   Janith Lima, MD  ARIPiprazole (ABILIFY)  2 MG tablet Take 1 tablet (2 mg total) by mouth daily. 10/10/19 02/27/20  Janith Lima, MD  divalproex (DEPAKOTE) 500 MG DR tablet Take 1 tablet (500 mg total) by mouth 2 (two) times daily. 10/10/19 02/27/20  Janith Lima, MD      Allergies    Wilder Glade [dapagliflozin], Metformin and related, Semaglutide(0.25 or 0.46m-dos), Statins, Augmentin [amoxicillin-pot clavulanate], Crestor [rosuvastatin], Lipitor [atorvastatin], and Zocor [simvastatin]    Review of Systems   Review of Systems  Cardiovascular:  Positive for chest pain.  Gastrointestinal:  Positive for abdominal pain and  constipation.   SEE HPI.  Physical Exam Updated Vital Signs BP 112/62    Pulse (!) 53    Temp 98.6 F (37 C) (Oral)    Resp 17    Ht 5' 6"  (1.676 m)    Wt 77.1 kg    SpO2 95%    BMI 27.43 kg/m  Physical Exam Vitals and nursing note reviewed.  Constitutional:      General: She is not in acute distress.    Appearance: Normal appearance. She is not ill-appearing or toxic-appearing.  HENT:     Head: Normocephalic and atraumatic.  Eyes:     General: No scleral icterus. Cardiovascular:     Rate and Rhythm: Normal rate and regular rhythm.  Pulmonary:     Effort: Pulmonary effort is normal. No respiratory distress.     Breath sounds: Normal breath sounds. No decreased breath sounds or wheezing.     Comments: Clear to auscultation bilaterally.  No respiratory distress, accessory muscle use, tripoding, nasal flaring, or cyanosis present.  Patient speaking in full sentences with ease and is satting well on room air without any increased work of breathing. Abdominal:     General: Bowel sounds are normal.     Palpations: Abdomen is soft.     Tenderness: There is abdominal tenderness. There is no guarding or rebound.     Comments: Tenderness to the right upper quadrant and overlying the right rib cage.  No overlying skin changes noted.  Normal active bowel sounds.  No guarding or rebound.  Positive Murphy's.  Musculoskeletal:        General: No deformity.     Cervical back: Normal range of motion.  Skin:    General: Skin is warm and dry.  Neurological:     General: No focal deficit present.     Mental Status: She is alert. Mental status is at baseline.    ED Results / Procedures / Treatments   Labs (all labs ordered are listed, but only abnormal results are displayed) Labs Reviewed  BASIC METABOLIC PANEL - Abnormal; Notable for the following components:      Result Value   Glucose, Bld 130 (*)    All other components within normal limits  RESP PANEL BY RT-PCR (FLU A&B, COVID) ARPGX2   CBC  LIPASE, BLOOD  URINALYSIS, ROUTINE W REFLEX MICROSCOPIC  HEPATIC FUNCTION PANEL  POC OCCULT BLOOD, ED  TROPONIN I (HIGH SENSITIVITY)  TROPONIN I (HIGH SENSITIVITY)    EKG EKG Interpretation  Date/Time:  Friday April 08 2021 15:10:01 EST Ventricular Rate:  61 PR Interval:  146 QRS Duration: 70 QT Interval:  382 QTC Calculation: 384 R Axis:   45 Text Interpretation: Normal sinus rhythm Nonspecific ST and T wave abnormality Abnormal ECG When compared with ECG of 20-Dec-2019 11:33, PREVIOUS ECG IS PRESENT when compared to prior, similar appearance. No STEMI Confirmed by TAntony Blackbird([phone] on 04/08/2021 4:51:57  PM  Radiology DG Chest 2 View  Result Date: 04/08/2021 CLINICAL DATA:  Chest pain RUQ x 3 days. States she has had burping for months. She feels the pain is related to her gallbladder. EXAM: CHEST - 2 VIEW COMPARISON:  06/04/2020 FINDINGS: Cardiac silhouette is normal in size. Normal mediastinal and hilar contours. Clear lungs.  No pleural effusion or pneumothorax. Skeletal structures are intact. IMPRESSION: No active cardiopulmonary disease. Electronically Signed   By: Lajean Manes M.D.   On: 04/08/2021 15:21   US Abdomen Limited RUQ (LIVER/GB)  Result Date: 04/08/2021 CLINICAL DATA:  Right upper quadrant pain for 4 days. EXAM: ULTRASOUND ABDOMEN LIMITED RIGHT UPPER QUADRANT COMPARISON:  None. FINDINGS: Gallbladder: Minimally distended.  No stones or wall thickening Common bile duct: Diameter: 8 mm.  No visualized duct stone. Liver: Normal size. Increased parenchymal echogenicity. Mild intrahepatic bile duct dilation. No mass. Portal vein is patent on color Doppler imaging with normal direction of blood flow towards the liver. Other: None. IMPRESSION: 1. Common bile duct dilated to 8 mm with mild intrahepatic bile duct dilation. This may be a chronic finding in this patient, but if there are symptoms of biliary duct obstruction, follow-up MRCP or ERCP would be recommended. 2.  Mildly distended gallbladder with no evidence of stones. 3. Increased liver parenchymal echogenicity consistent with hepatic steatosis. Electronically Signed   By: Lajean Manes M.D.   On: 04/08/2021 16:43    Procedures Procedures   Medications Ordered in ED Medications  lactated ringers bolus 1,000 mL ( Intravenous Stopped 04/08/21 1937)    ED Course/ Medical Decision Making/ A&P                           Medical Decision Making Amount and/or Complexity of Data Reviewed Labs: ordered. Radiology: ordered.  Risk Decision regarding hospitalization.   61 year old female presents emergency department for evaluation of right upper quadrant pain ongoing for months but gradually worsening the past 3 days.  Differential diagnosis includes but is not limited to costochondritis, MSK, cholecystitis, cholangitis, choledocholithiasis, cholelithiasis, ACS.  Vital signs show occasional bradycardia, otherwise unremarkable.  Physical exam is pertinent for some right upper quadrant tenderness with positive Murphy sign.  No guarding or rebound.  Normal active bowel sounds.  No overlying skin changes noted.  Lungs are clear to auscultation bilaterally.  She does have tenderness on the right rib cage as well.  Labs and ultrasound ordered.  She declines pain medication at this time.   I independently reviewed and interpreted the patient's labs and imaging and agree with radiologist findings.  Troponin 2 with repeat of 2, delta 0.  X-ray panel is negative for COVID and flu.  Lipase is normal.  BMP shows slightly elevated glucose at 130 otherwise no electrolyte abnormalities.  CBC shows no signs of anemia or leukocytosis.  Hepatic panel is also normal.  Normal LFTs.  Normal alk phos.  Normal total bili.  Urinalysis unremarkable.  EKG shows normal sinus rhythm nonspecific ST and T wave abnormality which is similar in appearance to previous EKG from November 2021.  Chest x-ray shows no active cardiopulmonary  disease.  Ultrasound shows minimally distended gallbladder with no stones or wall thickening.  The common bile duct is dilated to 8 mm with no visualized duct stone.  There is increased liver parenchymal echogenicity consistent with hepatic steatosis.  I doubt any ACS at this time given unchanged EKG and flat troponins.   Additional history obtained  from prior chart review.  The patient had an MRCP on 01-15-2020 which showed consistent findings of the 8 mm dilated CBD.  He did not show any distention of the gallbladder however.  I discussed the findings with GI Dr. Benson Norway who recommended admission for repeat MRCP and admission to medicine. Dr. Myna Hidalgo accepted admission.  The patient was anxious about admission and wanted an exact timeline of the admission plan. I spent over 35 minutes in the room answering questions and explaining admission and the process to assess her while in patient ad take her for a MRCP. I discussed with here that I do not know when she will go for the MRCP however. She wanted to be out by Monday for her granddaughter's birthday party. The patient was changing her mind between admission and leaving AMA. I returned to her room multiple times to answer her and family's admission questions to the best of my abilities.   The patient told nursing she wanted to leave immediately and her husband took her IV out and they left before signing [place] papers.   Final Clinical Impression(s) / ED Diagnoses Final diagnoses:  RUQ pain  Left against medical advice    Rx / DC Orders ED Discharge Orders     None         Sherrell Puller, PA-C 04/12/21 2243    Sherrell Puller, PA-C 04/12/21 2249    Tegeler, Gwenyth Allegra, MD 04/13/21 1326

## 2021-04-09 NOTE — ED Notes (Signed)
PA C was in speaking with the Pt. For at least 15 mins on Pt. Concerns.  Pt. In no distress but wanting many questions answered ?

## 2021-04-13 ENCOUNTER — Other Ambulatory Visit (HOSPITAL_COMMUNITY): Payer: Self-pay

## 2021-04-14 ENCOUNTER — Telehealth: Payer: Self-pay | Admitting: Physician Assistant

## 2021-04-14 ENCOUNTER — Other Ambulatory Visit (HOSPITAL_COMMUNITY): Payer: Self-pay

## 2021-04-14 NOTE — Telephone Encounter (Signed)
Pt called and would like to know if she can get something prescribed for her pain.  ?

## 2021-04-15 ENCOUNTER — Other Ambulatory Visit: Payer: Self-pay

## 2021-04-15 DIAGNOSIS — G8929 Other chronic pain: Secondary | ICD-10-CM

## 2021-04-15 NOTE — Telephone Encounter (Signed)
Order placed

## 2021-04-19 ENCOUNTER — Telehealth: Payer: Self-pay | Admitting: Internal Medicine

## 2021-04-19 ENCOUNTER — Ambulatory Visit (INDEPENDENT_AMBULATORY_CARE_PROVIDER_SITE_OTHER): Payer: No Typology Code available for payment source | Admitting: Physician Assistant

## 2021-04-19 ENCOUNTER — Other Ambulatory Visit (HOSPITAL_COMMUNITY): Payer: Self-pay

## 2021-04-19 ENCOUNTER — Encounter: Payer: Self-pay | Admitting: Physician Assistant

## 2021-04-19 ENCOUNTER — Other Ambulatory Visit: Payer: Self-pay | Admitting: Internal Medicine

## 2021-04-19 VITALS — BP 120/66 | HR 64 | Ht 66.0 in | Wt 176.0 lb

## 2021-04-19 DIAGNOSIS — R1013 Epigastric pain: Secondary | ICD-10-CM

## 2021-04-19 DIAGNOSIS — R9389 Abnormal findings on diagnostic imaging of other specified body structures: Secondary | ICD-10-CM

## 2021-04-19 DIAGNOSIS — R1011 Right upper quadrant pain: Secondary | ICD-10-CM

## 2021-04-19 DIAGNOSIS — F418 Other specified anxiety disorders: Secondary | ICD-10-CM

## 2021-04-19 DIAGNOSIS — K2100 Gastro-esophageal reflux disease with esophagitis, without bleeding: Secondary | ICD-10-CM | POA: Diagnosis not present

## 2021-04-19 DIAGNOSIS — F333 Major depressive disorder, recurrent, severe with psychotic symptoms: Secondary | ICD-10-CM

## 2021-04-19 MED ORDER — VILAZODONE HCL 10 MG PO TABS
10.0000 mg | ORAL_TABLET | Freq: Every day | ORAL | 0 refills | Status: DC
  Filled 2021-04-19 – 2021-04-29 (×2): qty 7, 7d supply, fill #0

## 2021-04-19 NOTE — Progress Notes (Signed)
? ?Chief Complaint: Right upper quadrant pain ? ?HPI: ?   Tasha [name] is a 61 year old female with a past medical history as listed below including anxiety and depression as well as fibromyalgia, known to Dr. Carlean Purl, who returns to clinic today for right upper quadrant pain. ?    06/23/2019 colonoscopy and EGD.  Colonoscopy was 7 diminutive polyps, diverticulosis and internal hemorrhoids, pathology showed tubular adenomas and repeat recommended in 2024.  EGD with tortuous esophagus and empiric dilation performed. ?   02/14/2021 BMP with minimally elevated glucose at 119 otherwise normal.  Normal lipase, amylase and TSH as well as CBC.  X-ray that day showed multiple air-fluid levels on upright view which may reflect enteritis, no evidence of bowel obstruction. ?   02/15/2021 CT of the abdomen pelvis with contrast with no acute abnormality and aortic atherosclerosis. ?   03/15/2021 patient described that she was having something that tastes and smells like bowel, up in her mouth while she was sleeping.  Described being on Rybelsus.  Also complained of constipation.  That time discussed the patient that everything could be related to her Rybelsus and recommend she discuss this further with her PCP.  At that time her Omeprazole was stopped and she was prescribed Pantoprazole 40 twice a day.  Also recommend she start taking MiraLAX on a daily basis. ?   03/24/2021 patient called and said the Pantoprazole was not helping.  We added Famotidine 40 mg twice daily. ?   04/08/2021 ER visit for chest/right upper quadrant pain.  Right upper quadrant ultrasound with common bile duct dilated to 8 mm with mild intrahepatic bile duct dilation, discussed this may be chronic but if there were symptoms of biliary duct obstruction then would recommend MRCP or ERCP.  Mildly distended gallbladder with no evidence of stones, increased liver appearing Chymol echogenicity consistent with hepatic steatosis.  Hepatic function panel that day was  normal.  After that visit it was recommended patient be admitted for repeat MRCP, but patient declined.  She ended up leaving AMA. ?   Today, the patient tells me that her reflux is still bothering her and very uncontrolled.  She stopped the Pantoprazole because it seemed to make things worse and restarted her Omeprazole 40 mg twice a day.  Tells me things have gotten maybe 5 to 10% better since she switched back to the Omeprazole.  She never picked up the Famotidine.  Tells me she believes a lot of her symptoms are related to stress that comes from her husband and her home life.  Tells me that the reason she left the ER AMA because her husband was stressing her out about staying. ?   Patient's Rybelsus was stopped in February, patient tells me that she feels like this is helped all of her GI symptoms but she feels overall "weak and tired", they have not started her back on anything else for diabetes. ?   Describes that the right upper quadrant pain she was experiencing when she went to the ER has occurred a couple other times but is not as severe and only last for a few minutes.  She is worried about ultrasound showing dilated bile ducts and what this means for her. ?   Denies fever, chills, weight loss, blood in her stool or change in bowel habits. ?    ?Past Medical History:  ?Diagnosis Date  ? Allergy   ? Anemia   ? past hx   ? Anxiety state, unspecified   ?  Arthralgia of temporomandibular joint   ? Arthritis   ? Asthma   ? as a child , not now per pt   ? Conversion disorder   ? Dementia ([place])   ? Depressive disorder, not elsewhere classified   ? Diabetes mellitus without complication ([place])   ? off all medicines as of 04-16-2019 PV   ? Dysphagia, unspecified(787.20)   ? Endometriosis   ? Esophageal reflux   ? Fibromyalgia   ? Headache(784.0)   ? Heart murmur   ? past hx murmur   ? Hx of adenomatous colonic polyps   ? Hypertension 07/18/2020  ? Irritable bowel syndrome   ? Mitral valve disorders(424.0)   ? Myalgia  and myositis, unspecified   ? Overweight(278.02)   ? Periodic limb movement disorder   ? Pure hypercholesterolemia   ? RLS (restless legs syndrome)   ? Seizures ([place])   ? last episode 5 yrs ago   ? Stroke [facility])   ? TIA per pt many years ago   ? Unspecified hypothyroidism   ? ? ?Past Surgical History:  ?Procedure Laterality Date  ? ABDOMINAL HYSTERECTOMY    ? TAH  ? arm surgery    ? COLONOSCOPY  04/14/2008  ? FOOT SURGERY    ? LIPOMA EXCISION    ? BESIDES BELLY BUTTON  ? POLYPECTOMY    ? HPP x 1  ? TONSILLECTOMY    ? UPPER GASTROINTESTINAL ENDOSCOPY  2010  ? g[facility]ner   ? WRIST SURGERY    ? ? ?Current Outpatient Medications  ?Medication Sig Dispense Refill  ? albuterol (VENTOLIN HFA) 108 (90 Base) MCG/ACT inhaler Inhale 2 puffs into the lungs every 6 (six) hours as needed for wheezing or shortness of breath. 8.5 g 0  ? ALPRAZolam (XANAX) 1 MG tablet TAKE 1 TABLET BY MOUTH 3 TIMES DAILY AS NEEDED FOR ANXIETY. 90 tablet 5  ? buPROPion (WELLBUTRIN XL) 150 MG 24 hr tablet Take 1 tablet by mouth daily. 90 tablet 1  ? ezetimibe (ZETIA) 10 MG tablet TAKE 1 TABLET BY MOUTH DAILY. 90 tablet 1  ? levothyroxine (SYNTHROID) 137 MCG tablet Take 1 tablet (137 mcg total) by mouth daily before breakfast. 90 tablet 1  ? omega-3 acid ethyl esters (LOVAZA) 1 g capsule Take 2 capsules by mouth 2 (two) times daily.    ? pioglitazone (ACTOS) 15 MG tablet TAKE 1 TABLET BY MOUTH DAILY 90 tablet 1  ? potassium chloride SA (KLOR-CON M) 20 MEQ tablet TAKE 1 TABLET BY MOUTH 2 TIMES DAILY. 180 tablet 0  ? pravastatin (PRAVACHOL) 10 MG tablet TAKE 1 TABLET BY MOUTH ONCE A DAY 90 tablet 1  ? sertraline (ZOLOFT) 50 MG tablet Take 1 tablet by mouth daily. 30 tablet 0  ? tirzepatide (MOUNJARO) 2.5 MG/0.5ML Pen Inject 2.5 mg into the skin once a week. 2 mL 0  ? vortioxetine HBr (TRINTELLIX) 5 MG TABS tablet Take 1 tablet by mouth daily. 30 tablet 0  ? famotidine (PEPCID) 40 MG tablet Take 1 tablet (40 mg total) by mouth 2 (two) times daily. (Patient not  taking: Reported on 04/19/2021) 60 tablet 3  ? pantoprazole (PROTONIX) 40 MG tablet Take 1 tablet by mouth 2 (two) times daily before a meal. (Patient not taking: Reported on 04/19/2021) 60 tablet 5  ? ?No current facility-administered medications for this visit.  ? ? ?Allergies as of 04/19/2021 - Review Complete 04/08/2021  ?Allergen Reaction Noted  ? Wilder Glade [dapagliflozin] Other (See Comments) 03/19/2017  ? Metformin  and related Diarrhea 04/10/2019  ? Semaglutide(0.25 or 0.71m-dos) Nausea And Vomiting 04/16/2019  ? Statins Other (See Comments) 03/26/2015  ? Augmentin [amoxicillin-pot clavulanate] Other (See Comments) 08/05/2019  ? Crestor [rosuvastatin] Other (See Comments) 04/22/2013  ? Lipitor [atorvastatin] Other (See Comments) 04/22/2013  ? Zocor [simvastatin] Other (See Comments) 12/16/2013  ? ? ?Family History  ?Problem Relation Age of Onset  ? Heart disease Mother   ? Heart disease Father   ? Diabetes Father   ? Colon cancer Father 725 ?     died age 9489from colon cancer spreading per pt   ? Breast cancer Maternal Aunt   ? Colon cancer Paternal Aunt   ? Stomach cancer Paternal Aunt   ? Lung cancer Paternal Aunt   ? Rectal cancer Paternal Aunt   ? Stomach cancer Paternal Uncle   ? Colon polyps Neg Hx   ? Esophageal cancer Neg Hx   ? ? ?Social History  ? ?Socioeconomic History  ? Marital status: Married  ?  Spouse name: [name]  ? Number of children: 2  ? Years of education: 155 ? Highest education level: Not on file  ?Occupational History  ? Occupation: Disabled  ?  Employer: [employer]  ?Tobacco Use  ? Smoking status: Some Days  ?  Packs/day: 1.50  ?  Years: 43.00  ?  Pack years: 64.50  ?  Types: Cigarettes  ? Smokeless tobacco: Never  ? Tobacco comments:  ?  1-2 per day  ?Vaping Use  ? Vaping Use: Former  ? Substances: Nicotine, Flavoring  ?Substance and Sexual Activity  ? Alcohol use: Yes  ?  Comment: rarely  ? Drug use: No  ? Sexual activity: Yes  ?Other Topics Concern  ? Not on file  ?Social  History Narrative  ? [facility]/ [facility] ? Caffeine use: 2 cups coffee per day  ? Tea-3-4 drinks  ? Right handed   ? ?Social Determinants of Health  ? ?Financial Resource Strain: Not

## 2021-04-19 NOTE — Patient Instructions (Signed)
You have been scheduled for an MRI/MRCP  at [facility] on 04/29/2021. Your appointment time is 8am. Please arrive to admitting (at main entrance of the hospital) 30 minutes prior to your appointment time for registration purposes. Please make certain not to have anything to eat or drink 6 hours prior to your test. In addition, if you have any metal in your body, have a pacemaker or defibrillator, please be sure to let your ordering physician know. This test typically takes 45 minutes to 1 hour to complete. Should you need to reschedule, please call [phone] to do so.  ? ?Start your Famotidine 40 mg every morning and at bedtime  ? ?Due to recent changes in healthcare laws, you may see the results of your imaging and laboratory studies on MyChart before your provider has had a chance to review them.  We understand that in some cases there may be results that are confusing or concerning to you. Not all laboratory results come back in the same time frame and the provider may be waiting for multiple results in order to interpret others.  Please give [place] 48 hours in order for your provider to thoroughly review all the results before contacting the office for clarification of your results.   ? ?Follow up per recommendations after your test results  ? ?If you are age 54 or older, your body mass index should be between 23-30. Your Body mass index is 28.41 kg/m?Marland Kitchen If this is out of the aforementioned range listed, please consider follow up with your Primary Care Provider. ? ?If you are age 65 or younger, your body mass index should be between 19-25. Your Body mass index is 28.41 kg/m?Marland Kitchen If this is out of the aformentioned range listed, please consider follow up with your Primary Care Provider.  ? ?________________________________________________________ ? ?The [facility] providers would like to encourage you to use [facility] to communicate with providers for non-urgent requests or questions.  Due to long hold times  on the telephone, sending your provider a message by [facility] may be a faster and more efficient way to get a response.  Please allow 48 business hours for a response.  Please remember that this is for non-urgent requests.  ?_______________________________________________________  ? ?I appreciate the  opportunity to care for you ? ?Thank You  ? ?Lovett Calender ?

## 2021-04-19 NOTE — Telephone Encounter (Signed)
PT calls today in regards to getting a change in medication! PT's insurance provider notified her that she could get her Trentellix changed out for Trazodone! They had informed her that it would not require a co-pay but that she would need the approval and new prescription put in by Dr.Jones!  ? ?CB: [phone] ?

## 2021-04-20 ENCOUNTER — Other Ambulatory Visit (HOSPITAL_COMMUNITY): Payer: Self-pay

## 2021-04-22 ENCOUNTER — Telehealth: Payer: Self-pay | Admitting: Physical Medicine and Rehabilitation

## 2021-04-22 ENCOUNTER — Other Ambulatory Visit (HOSPITAL_COMMUNITY): Payer: Self-pay

## 2021-04-22 NOTE — Telephone Encounter (Signed)
Pt asking for a call back to change appt to see Dr. Ernestina Patches. Please call pt at [phone]. ?

## 2021-04-27 ENCOUNTER — Ambulatory Visit: Payer: No Typology Code available for payment source | Admitting: Physical Medicine and Rehabilitation

## 2021-04-29 ENCOUNTER — Other Ambulatory Visit (HOSPITAL_COMMUNITY): Payer: Self-pay

## 2021-04-29 ENCOUNTER — Other Ambulatory Visit (HOSPITAL_COMMUNITY): Payer: No Typology Code available for payment source

## 2021-04-30 ENCOUNTER — Other Ambulatory Visit (HOSPITAL_COMMUNITY): Payer: Self-pay

## 2021-05-01 ENCOUNTER — Ambulatory Visit
Admission: RE | Admit: 2021-05-01 | Discharge: 2021-05-01 | Disposition: A | Discharge status: Home / Self Care | Payer: No Typology Code available for payment source | Source: Ambulatory Visit | Attending: Physician Assistant | Admitting: Physician Assistant

## 2021-05-01 ENCOUNTER — Other Ambulatory Visit: Payer: Self-pay

## 2021-05-01 DIAGNOSIS — R1011 Right upper quadrant pain: Secondary | ICD-10-CM

## 2021-05-01 DIAGNOSIS — K5710 Diverticulosis of small intestine without perforation or abscess without bleeding: Secondary | ICD-10-CM | POA: Diagnosis not present

## 2021-05-01 DIAGNOSIS — R9389 Abnormal findings on diagnostic imaging of other specified body structures: Secondary | ICD-10-CM

## 2021-05-01 MED ORDER — GADOBENATE DIMEGLUMINE 529 MG/ML IV SOLN
19.0000 mL | Freq: Once | INTRAVENOUS | Status: AC | PRN
  Administered 2021-05-01: 19 mL via INTRAVENOUS

## 2021-05-16 ENCOUNTER — Other Ambulatory Visit: Payer: Self-pay | Admitting: Internal Medicine

## 2021-05-16 ENCOUNTER — Other Ambulatory Visit (HOSPITAL_COMMUNITY): Payer: Self-pay

## 2021-05-16 DIAGNOSIS — K219 Gastro-esophageal reflux disease without esophagitis: Secondary | ICD-10-CM

## 2021-05-17 ENCOUNTER — Other Ambulatory Visit (HOSPITAL_COMMUNITY): Payer: Self-pay

## 2021-05-18 ENCOUNTER — Ambulatory Visit: Payer: No Typology Code available for payment source | Admitting: Physical Medicine and Rehabilitation

## 2021-05-18 ENCOUNTER — Telehealth: Payer: Self-pay | Admitting: Physical Medicine and Rehabilitation

## 2021-05-18 NOTE — Telephone Encounter (Signed)
Pt returned call to [facility]. Please call pt at [phone]. ?

## 2021-05-19 ENCOUNTER — Telehealth: Payer: Self-pay | Admitting: Internal Medicine

## 2021-05-19 NOTE — Telephone Encounter (Signed)
Pt states she has vomited "gallons of fluid" and has severe diarrhea since 05-15-2021 ? ?Pt states she needs fluids, advised pt to go to er, pt stated she will not go to the er when she "has a doctor" ? ?Offered to transfer call to team health, pt verbalized understanding, call transferred  ?

## 2021-05-25 ENCOUNTER — Other Ambulatory Visit (HOSPITAL_COMMUNITY): Payer: Self-pay

## 2021-05-30 ENCOUNTER — Other Ambulatory Visit (HOSPITAL_COMMUNITY): Payer: Self-pay

## 2021-05-30 ENCOUNTER — Telehealth: Payer: Self-pay | Admitting: Internal Medicine

## 2021-05-30 ENCOUNTER — Other Ambulatory Visit: Payer: Self-pay | Admitting: Internal Medicine

## 2021-05-30 DIAGNOSIS — E876 Hypokalemia: Secondary | ICD-10-CM

## 2021-05-30 DIAGNOSIS — F418 Other specified anxiety disorders: Secondary | ICD-10-CM

## 2021-05-30 NOTE — Telephone Encounter (Signed)
Pts daughter called in w/ pt, inquiring about an update on mounjaro ? ?Pt states she has a raised painful spot on the shin area since 05-30-2021, there is no discoloration  ? ?Offered pt an appt, pt and daughter declined and requesting a cb w/ provider's recommendations  ? ? ?

## 2021-05-31 ENCOUNTER — Other Ambulatory Visit (HOSPITAL_COMMUNITY): Payer: Self-pay

## 2021-05-31 NOTE — Telephone Encounter (Signed)
Pt checking status  of return call ? ?Pt inquiring if a short supply of ALPRAZolam (XANAX) 1 MG tablet can be prescribed until next ov on 5-2 ?

## 2021-06-01 ENCOUNTER — Other Ambulatory Visit: Payer: Self-pay | Admitting: Internal Medicine

## 2021-06-01 ENCOUNTER — Other Ambulatory Visit (HOSPITAL_COMMUNITY): Payer: Self-pay

## 2021-06-01 MED ORDER — ALPRAZOLAM 1 MG PO TABS
ORAL_TABLET | Freq: Three times a day (TID) | ORAL | 2 refills | Status: DC | PRN
  Filled 2021-06-01: qty 90, 30d supply, fill #0
  Filled 2021-06-29: qty 90, 30d supply, fill #1
  Filled 2021-07-28: qty 90, 30d supply, fill #2

## 2021-06-01 NOTE — Addendum Note (Signed)
Addended by: Janith Lima on: 06/01/2021 03:56 PM ? ? Modules accepted: Orders ? ?

## 2021-06-07 ENCOUNTER — Other Ambulatory Visit (HOSPITAL_COMMUNITY): Payer: Self-pay

## 2021-06-07 ENCOUNTER — Ambulatory Visit (INDEPENDENT_AMBULATORY_CARE_PROVIDER_SITE_OTHER): Payer: No Typology Code available for payment source | Admitting: Internal Medicine

## 2021-06-07 ENCOUNTER — Encounter: Payer: Self-pay | Admitting: Internal Medicine

## 2021-06-07 VITALS — BP 128/62 | HR 67 | Temp 97.8°F | Ht 66.0 in | Wt 175.0 lb

## 2021-06-07 DIAGNOSIS — I10 Essential (primary) hypertension: Secondary | ICD-10-CM

## 2021-06-07 DIAGNOSIS — E039 Hypothyroidism, unspecified: Secondary | ICD-10-CM | POA: Diagnosis not present

## 2021-06-07 DIAGNOSIS — E785 Hyperlipidemia, unspecified: Secondary | ICD-10-CM

## 2021-06-07 DIAGNOSIS — E118 Type 2 diabetes mellitus with unspecified complications: Secondary | ICD-10-CM

## 2021-06-07 DIAGNOSIS — F333 Major depressive disorder, recurrent, severe with psychotic symptoms: Secondary | ICD-10-CM | POA: Diagnosis not present

## 2021-06-07 DIAGNOSIS — F418 Other specified anxiety disorders: Secondary | ICD-10-CM | POA: Diagnosis not present

## 2021-06-07 DIAGNOSIS — J069 Acute upper respiratory infection, unspecified: Secondary | ICD-10-CM | POA: Diagnosis not present

## 2021-06-07 LAB — HEMOGLOBIN A1C: Hgb A1c MFr Bld: 6.8 % — ABNORMAL HIGH (ref 4.6–6.5)

## 2021-06-07 LAB — POC COVID19 BINAXNOW: SARS Coronavirus 2 Ag: NEGATIVE

## 2021-06-07 MED ORDER — TIRZEPATIDE 2.5 MG/0.5ML ~~LOC~~ SOAJ
2.5000 mg | SUBCUTANEOUS | 0 refills | Status: DC
  Filled 2021-06-07: qty 2, 28d supply, fill #0

## 2021-06-07 MED ORDER — LEVOTHYROXINE SODIUM 137 MCG PO TABS
137.0000 ug | ORAL_TABLET | Freq: Every day | ORAL | 1 refills | Status: DC
  Filled 2021-06-07 – 2021-08-26 (×2): qty 90, 90d supply, fill #0
  Filled 2021-08-31: qty 48, 48d supply, fill #0
  Filled 2021-08-31: qty 90, 90d supply, fill #0
  Filled 2021-08-31: qty 42, 42d supply, fill #0

## 2021-06-07 MED ORDER — SERTRALINE HCL 50 MG PO TABS
50.0000 mg | ORAL_TABLET | Freq: Every day | ORAL | 0 refills | Status: DC
  Filled 2021-06-07 – 2021-06-28 (×2): qty 90, 90d supply, fill #0

## 2021-06-07 MED ORDER — EZETIMIBE 10 MG PO TABS
ORAL_TABLET | Freq: Every day | ORAL | 1 refills | Status: DC
  Filled 2021-06-07 – 2021-06-28 (×2): qty 90, 90d supply, fill #0

## 2021-06-07 MED ORDER — BUPROPION HCL ER (XL) 150 MG PO TB24
150.0000 mg | ORAL_TABLET | Freq: Every day | ORAL | 1 refills | Status: DC
  Filled 2021-06-07 – 2021-06-28 (×2): qty 90, 90d supply, fill #0
  Filled 2021-10-01: qty 90, 90d supply, fill #1

## 2021-06-07 NOTE — Progress Notes (Signed)
? ?Subjective:  ?Patient ID: Tasha [name], female    DOB: [birth year]  Age: 61 y.o. MRN: [id number] ? ?CC: Hyperlipidemia, Diabetes, Hypothyroidism, and URI ? ? ?HPI ?Tasha [name] presents for f/up- ? ?She complains of a 3 day hx of runny nose, PND, and NP cough.  She complains that Trintellix and Mounjaro are too expensive and she wants to restart sertraline. ? ?Outpatient Medications Prior to Visit  ?Medication Sig Dispense Refill  ? albuterol (VENTOLIN HFA) 108 (90 Base) MCG/ACT inhaler Inhale 2 puffs into the lungs every 6 (six) hours as needed for wheezing or shortness of breath. 8.5 g 0  ? ALPRAZolam (XANAX) 1 MG tablet TAKE 1 TABLET BY MOUTH 3 TIMES DAILY AS NEEDED FOR ANXIETY. 90 tablet 2  ? omega-3 acid ethyl esters (LOVAZA) 1 g capsule Take 2 capsules by mouth 2 (two) times daily.    ? pioglitazone (ACTOS) 15 MG tablet TAKE 1 TABLET BY MOUTH DAILY 90 tablet 1  ? potassium chloride SA (KLOR-CON M) 20 MEQ tablet TAKE 1 TABLET BY MOUTH 2 TIMES DAILY. 180 tablet 0  ? pravastatin (PRAVACHOL) 10 MG tablet TAKE 1 TABLET BY MOUTH ONCE A DAY 90 tablet 1  ? buPROPion (WELLBUTRIN XL) 150 MG 24 hr tablet Take 1 tablet by mouth daily. 90 tablet 1  ? ezetimibe (ZETIA) 10 MG tablet TAKE 1 TABLET BY MOUTH DAILY. 90 tablet 1  ? famotidine (PEPCID) 40 MG tablet Take 1 tablet (40 mg total) by mouth 2 (two) times daily. 60 tablet 3  ? levothyroxine (SYNTHROID) 137 MCG tablet Take 1 tablet (137 mcg total) by mouth daily before breakfast. 90 tablet 1  ? pantoprazole (PROTONIX) 40 MG tablet Take 1 tablet by mouth 2 (two) times daily before a meal. 60 tablet 5  ? tirzepatide (MOUNJARO) 2.5 MG/0.5ML Pen Inject 2.5 mg into the skin once a week. 2 mL 0  ? Vilazodone HCl (VIIBRYD) 10 MG TABS Take 1 tablet by mouth daily for 7 days. 7 tablet 0  ? ?No facility-administered medications prior to visit.  ? ? ?ROS ?Review of Systems  ?Constitutional:  Negative for chills, fatigue, fever and unexpected weight change.  ?HENT:   Positive for congestion, postnasal drip and rhinorrhea. Negative for facial swelling, nosebleeds, sinus pressure, sinus pain, sore throat and trouble swallowing.   ?Eyes: Negative.   ?Respiratory:  Positive for cough. Negative for chest tightness, shortness of breath and wheezing.   ?Cardiovascular:  Negative for chest pain, palpitations and leg swelling.  ?Gastrointestinal:  Negative for abdominal pain, diarrhea, nausea and vomiting.  ?Endocrine: Negative.   ?Genitourinary: Negative.   ?Musculoskeletal:  Negative for arthralgias and myalgias.  ?Skin: Negative.  Negative for color change and rash.  ?Neurological: Negative.  Negative for dizziness, weakness and light-headedness.  ?Hematological:  Negative for adenopathy. Does not bruise/bleed easily.  ?Psychiatric/Behavioral:  Positive for dysphoric mood. Negative for behavioral problems, confusion, decreased concentration, self-injury, sleep disturbance and suicidal ideas. The patient is nervous/anxious. The patient is not hyperactive.   ? ?Objective:  ?BP 128/62 (BP Location: Right Arm, Patient Position: Sitting, Cuff Size: Large)   Pulse 67   Temp 97.8 ?F (36.6 ?C) (Oral)   Ht 5' 6"  (1.676 m)   Wt 175 lb (79.4 kg)   SpO2 98%   BMI 28.25 kg/m?  ? ?BP Readings from Last 3 Encounters:  ?06/07/21 128/62  ?04/19/21 120/66  ?04/08/21 112/62  ? ? ?Wt Readings from Last 3 Encounters:  ?06/07/21 175 lb (79.4 kg)  ?  04/19/21 176 lb (79.8 kg)  ?04/08/21 169 lb 15.6 oz (77.1 kg)  ? ? ?Physical Exam ?Vitals reviewed.  ?Constitutional:   ?   Appearance: Normal appearance. She is not ill-appearing.  ?HENT:  ?   Nose: Nose normal.  ?   Mouth/Throat:  ?   Mouth: Mucous membranes are moist.  ?Eyes:  ?   General: No scleral icterus. ?   Conjunctiva/sclera: Conjunctivae normal.  ?Cardiovascular:  ?   Rate and Rhythm: Normal rate and regular rhythm.  ?   Heart sounds: No murmur heard. ?Pulmonary:  ?   Effort: Pulmonary effort is normal.  ?   Breath sounds: No stridor. No  wheezing, rhonchi or rales.  ?Abdominal:  ?   General: Abdomen is flat.  ?   Palpations: There is no mass.  ?   Tenderness: There is no abdominal tenderness. There is no guarding.  ?   Hernia: No hernia is present.  ?Musculoskeletal:     ?   General: Normal range of motion.  ?   Cervical back: Neck supple.  ?   Right lower leg: No edema.  ?   Left lower leg: No edema.  ?Lymphadenopathy:  ?   Cervical: No cervical adenopathy.  ?Skin: ?   General: Skin is dry.  ?Neurological:  ?   General: No focal deficit present.  ?   Mental Status: She is alert.  ?Psychiatric:     ?   Mood and Affect: Mood normal.     ?   Behavior: Behavior normal.  ? ? ?Lab Results  ?Component Value Date  ? WBC 7.9 04/08/2021  ? HGB 14.3 04/08/2021  ? HCT 42.8 04/08/2021  ? PLT 163 04/08/2021  ? GLUCOSE 130 (H) 04/08/2021  ? CHOL 213 (H) 02/14/2021  ? TRIG 219.0 (H) 02/14/2021  ? HDL 47.60 02/14/2021  ? LDLDIRECT 140.0 02/14/2021  ? [place] 107 (H) 12/23/2019  ? ALT 35 04/08/2021  ? AST 30 04/08/2021  ? NA 137 04/08/2021  ? K 3.7 04/08/2021  ? CL 102 04/08/2021  ? CREATININE 0.91 04/08/2021  ? BUN 11 04/08/2021  ? CO2 26 04/08/2021  ? TSH 1.07 02/14/2021  ? INR 1.0 01/27/2019  ? HGBA1C 6.8 (H) 06/07/2021  ? MICROALBUR <0.7 07/15/2020  ? ? ?MR ABDOMEN MRCP W WO CONTAST ? ?Result Date: 05/02/2021 ?CLINICAL DATA:  Right upper quadrant pain, abnormal ultrasound EXAM: MRI ABDOMEN WITHOUT AND WITH CONTRAST (INCLUDING MRCP) TECHNIQUE: Multiplanar multisequence MR imaging of the abdomen was performed both before and after the administration of intravenous contrast. Heavily T2-weighted images of the biliary and pancreatic ducts were obtained, and three-dimensional MRCP images were rendered by post processing. CONTRAST:  31m MULTIHANCE GADOBENATE DIMEGLUMINE 529 MG/ML IV SOLN COMPARISON:  Abdominal ultrasound 04/08/2021 FINDINGS: Lower chest: No acute findings. Hepatobiliary: Liver is normal in size and contour. There is evidence of mild hepatic steatosis.  No suspicious hepatic mass visualized. Gallbladder appears within normal limits. No cholelithiasis. No intra hepatic biliary ductal dilatation. The common bile duct is slightly dilated measuring up to 7 mm in diameter with smooth tapering distally. No choledocholithiasis visualized. Pancreas: No mass, inflammatory changes, or other parenchymal abnormality identified. Spleen:  Within normal limits in size and appearance. Adrenals/Urinary Tract: Adrenal glands appear normal. No suspicious renal mass or hydronephrosis identified bilaterally. Stomach/Bowel: 2 cm proximal duodenal diverticulum which is just anterior to the common bile duct. No evidence of bowel obstruction. Vascular/Lymphatic: No pathologically enlarged lymph nodes identified. No abdominal aortic aneurysm demonstrated.  Other:  No ascites. Musculoskeletal: No suspicious bone lesions identified. IMPRESSION: 1. No cholelithiasis or choledocholithiasis visualized. The common bile duct is minimally dilated measuring 7 mm in diameter with smooth tapering distally. 2. Duodenal diverticulum noted. Electronically Signed   By: Ofilia Neas M.D.   On: 05/02/2021 20:20  ? ? ?Assessment & Plan:  ? ?Shahad was seen today for hyperlipidemia, diabetes, hypothyroidism and uri. ? ?Diagnoses and all orders for this visit: ? ?Viral upper respiratory tract infection- She is COVID negative. Will treat the symptoms. ?-     POC COVID-19 ? ?Acquired hypothyroidism ?-     levothyroxine (SYNTHROID) 137 MCG tablet; Take 1 tablet by mouth daily before breakfast. ? ?Severe episode of recurrent major depressive disorder, with psychotic features ([place]) ?-     buPROPion (WELLBUTRIN XL) 150 MG 24 hr tablet; Take 1 tablet by mouth daily. ? ?Hyperlipidemia with target LDL less than 130 ?-     ezetimibe (ZETIA) 10 MG tablet; TAKE 1 TABLET BY MOUTH DAILY. ? ?Type II diabetes mellitus with manifestations ([place])- Her blood sugar is adequately well controlled. ?-     Discontinue:  tirzepatide (MOUNJARO) 2.5 MG/0.5ML Pen; Inject 2.5 mg into the skin once a week. ?-     Hemoglobin A1c; Future ?-     Hemoglobin A1c ? ?Primary hypertension- Her BP is adequately well controlled. ? ?Depression with anxiety ?-

## 2021-06-07 NOTE — Patient Instructions (Signed)

## 2021-06-08 ENCOUNTER — Encounter: Payer: Self-pay | Admitting: *Deleted

## 2021-06-08 DIAGNOSIS — Z006 Encounter for examination for normal comparison and control in clinical research program: Secondary | ICD-10-CM

## 2021-06-08 NOTE — Research (Signed)
Spoke with Tasha [name] about Designer, multimedia. A copy of the consent was emailed to her to review.  ?

## 2021-06-13 ENCOUNTER — Encounter: Payer: Self-pay | Admitting: *Deleted

## 2021-06-13 DIAGNOSIS — Z006 Encounter for examination for normal comparison and control in clinical research program: Secondary | ICD-10-CM

## 2021-06-13 NOTE — Research (Signed)
Spoke with Tasha [name] about Designer, multimedia.She states she ha not looked over the information I have sent her. Encouraged her to look over the info and I will call her at a later time.  ?

## 2021-06-14 ENCOUNTER — Other Ambulatory Visit (HOSPITAL_COMMUNITY): Payer: Self-pay

## 2021-06-15 ENCOUNTER — Other Ambulatory Visit (HOSPITAL_COMMUNITY): Payer: Self-pay

## 2021-06-27 ENCOUNTER — Other Ambulatory Visit: Payer: Self-pay | Admitting: *Deleted

## 2021-06-27 NOTE — Patient Instructions (Signed)
Visit Information  Thank you for taking time to visit with me today. Please don't hesitate to contact me if I can be of assistance to you before our next scheduled telephone appointment.  Following are the goals we discussed today:  Current Barriers:  Knowledge Deficits related to plan of care for management of DMII   RNCM Clinical Goal(s):  Patient will verbalize understanding of plan for management of DMII as evidenced by continuation of monitoring blood sugars and adhering to diabetic diet through collaboration with RN Care manager, provider, and care team.   Interventions: Inter-disciplinary care team collaboration (see longitudinal plan of care) Evaluation of current treatment plan related to  self management and patient's adherence to plan as established by provider   Diabetes Interventions:  (Status:  Goal on track:  Yes.) Long Term Goal Assessed patient's understanding of A1c goal: <7% Provided education to patient about basic DM disease process Reviewed medications with patient and discussed importance of medication adherence Discussed plans with patient for ongoing care management follow up and provided patient with direct contact information for care management team Provided patient with written educational materials related to hypo and hyperglycemia and importance of correct treatment Lab Results  Component Value Date   HGBA1C 6.8 (H) 02/14/2021    Patient Goals/Self-Care Activities: Take all medications as prescribed Attend all scheduled provider appointments Call pharmacy for medication refills 3-7 days in advance of running out of medications Attend church or other social activities Perform all self care activities independently  Perform IADL's (shopping, preparing meals, housekeeping, managing finances) independently Call provider office for new concerns or questions  call the Suicide and Crisis Lifeline: 988 if experiencing a Mental Health or [place]  check feet daily for cuts, sores or redness trim toenails straight across drink 6 to 8 glasses of water each day wash and dry feet carefully every day wear comfortable, cotton socks wear comfortable, well-fitting shoes Patient will discuss with Dr on getting a glucose monitor  Follow Up Plan:  Telephone follow up appointment with care management team member scheduled for:  August 2023 The patient has been provided with contact information for the care management team and has been advised to call with any health related questions or concerns.     [phone]  Per patient she does not monitor her blood sugar. Her A1C is 6.8. Patient will discuss with Research program regarding taking Olezarsen.     Our next appointment is by telephone on September 20, 2021  Please call Johny Shock RN [phone] if you need to cancel or reschedule your appointment.   Please call the Suicide and Crisis Lifeline: 988 if you are experiencing a Mental Health or [place] or need someone to talk to.  The patient verbalized understanding of instructions, educational materials, and care plan provided today and agreed to receive a mailed copy of patient instructions, educational materials, and care plan.   Telephone follow up appointment with care management team member scheduled for: The patient has been provided with contact information for the care management team and has been advised to call with any health related questions or concerns.   [place] [facility] [phone]

## 2021-06-27 NOTE — Patient Outreach (Signed)
[facility]) Care Management [place] Note   06/27/2021 Name:  Tasha [name] MRN:  [id number] DOB:  1960-03-02  Summary: A1C is  6.8. Patient is trying to eat healthy. No recent falls.  Recommendations/Changes made from today's visit: Medication adherence Dietary adherence     Subjective: LESLE [name] is an 61 y.o. year old female who is a primary patient of Janith Lima, MD. The care management team was consulted for assistance with care management and/or care coordination needs.    RN Health Coach completed Telephone Visit today.   Objective:  Medications Reviewed Today     Reviewed by Janith Lima, MD (Physician) on 06/07/21 at 1156  Med List Status: <None>   Medication Order Taking? Sig Documenting Provider Last Dose Status Informant  albuterol (VENTOLIN HFA) 108 (90 Base) MCG/ACT inhaler [id number] Yes Inhale 2 puffs into the lungs every 6 (six) hours as needed for wheezing or shortness of breath. Lucretia Kern, DO Taking Active   ALPRAZolam Duanne Moron) 1 MG tablet [id number] Yes TAKE 1 TABLET BY MOUTH 3 TIMES DAILY AS NEEDED FOR ANXIETY. Janith Lima, MD Taking Active     Discontinued 02/27/20 1957   buPROPion (WELLBUTRIN XL) 150 MG 24 hr tablet [id number]  Take 1 tablet by mouth daily. Janith Lima, MD  Active     Discontinued 02/27/20 1957   ezetimibe (ZETIA) 10 MG tablet [id number]  TAKE 1 TABLET BY MOUTH DAILY. Janith Lima, MD  Active   levothyroxine (SYNTHROID) 137 MCG tablet [id number]  Take 1 tablet (137 mcg total) by mouth daily before breakfast. Janith Lima, MD  Active   omega-3 acid ethyl esters (LOVAZA) 1 g capsule [id number] Yes Take 2 capsules by mouth 2 (two) times daily. [provider] Taking Active   pioglitazone (ACTOS) 15 MG tablet [id number] Yes TAKE 1 TABLET BY MOUTH DAILY Janith Lima, MD Taking Active   potassium chloride SA (KLOR-CON M) 20 MEQ tablet [id number] Yes TAKE 1 TABLET BY MOUTH 2 TIMES  DAILY. Janith Lima, MD Taking Active   pravastatin (PRAVACHOL) 10 MG tablet [id number] Yes TAKE 1 TABLET BY MOUTH ONCE A DAY Janith Lima, MD Taking Active   sertraline (ZOLOFT) 50 MG tablet [id number] Yes Take 1 tablet (50 mg total) by mouth daily. Janith Lima, MD  Active              SDOH:  (Social Determinants of Health) assessments and interventions performed:  SDOH Interventions    Flowsheet Row Most Recent Value  SDOH Interventions   Food Insecurity Interventions Intervention Not Indicated  Housing Interventions Intervention Not Indicated  Transportation Interventions Intervention Not Indicated       Care Plan  Review of patient past medical history, allergies, medications, health status, including review of consultants reports, laboratory and other test data, was performed as part of comprehensive evaluation for care management services.   Care Plan : RN Care Manager Plan of Care  Updates made by Sathvik Tiedt, Eppie Gibson, RN since 06/27/2021 12:00 AM     Problem: Knowledge Deficit Related to Diabetes   Priority: High     Long-Range Goal: Development Plan of Care for Management of Diabetes   Start Date: 04/01/2021  Expected End Date: 02/04/2022  Priority: High  Note:   Current Barriers:  Knowledge Deficits related to plan of care for management of DMII   RNCM Clinical Goal(s):  Patient will verbalize understanding of plan for management of  DMII as evidenced by continuation of monitoring blood sugars and adhering to diabetic diet  through collaboration with RN Care manager, provider, and care team.   Interventions: Inter-disciplinary care team collaboration (see longitudinal plan of care) Evaluation of current treatment plan related to  self management and patient's adherence to plan as established by provider   Diabetes Interventions:  (Status:  Goal on track:  Yes.) Long Term Goal Assessed patient's understanding of A1c goal: <7% Provided education to  patient about basic DM disease process Reviewed medications with patient and discussed importance of medication adherence Discussed plans with patient for ongoing care management follow up and provided patient with direct contact information for care management team Provided patient with written educational materials related to hypo and hyperglycemia and importance of correct treatment Lab Results  Component Value Date   HGBA1C 6.8 (H) 02/14/2021   Patient Goals/Self-Care Activities: Take all medications as prescribed Attend all scheduled provider appointments Call pharmacy for medication refills 3-7 days in advance of running out of medications Attend church or other social activities Perform all self care activities independently  Perform IADL's (shopping, preparing meals, housekeeping, managing finances) independently Call provider office for new concerns or questions  call the Suicide and Crisis Lifeline: 988 if experiencing a Mental Health or [place]  check feet daily for cuts, sores or redness trim toenails straight across drink 6 to 8 glasses of water each day wash and dry feet carefully every day wear comfortable, cotton socks wear comfortable, well-fitting shoes Patient will discuss with Dr on getting a glucose monitor  Follow Up Plan:  Telephone follow up appointment with care management team member scheduled for:  August 2023 The patient has been provided with contact information for the care management team and has been advised to call with any health related questions or concerns.     [id number]  Per patient she does not monitor her blood sugar. Her A1C is 6.8. Patient will discuss with Research program regarding taking Olezarsen.            Plan: Telephone follow up appointment with care management team member scheduled for:  September 20, 2021 The patient has been provided with contact information for the care management team and has been advised to call  with any health related questions or concerns.   [place] [facility] Management [phone]

## 2021-06-28 ENCOUNTER — Other Ambulatory Visit: Payer: Self-pay | Admitting: Internal Medicine

## 2021-06-28 ENCOUNTER — Other Ambulatory Visit (HOSPITAL_COMMUNITY): Payer: Self-pay

## 2021-06-28 DIAGNOSIS — K219 Gastro-esophageal reflux disease without esophagitis: Secondary | ICD-10-CM

## 2021-06-29 ENCOUNTER — Other Ambulatory Visit (HOSPITAL_COMMUNITY): Payer: Self-pay

## 2021-07-22 ENCOUNTER — Ambulatory Visit (INDEPENDENT_AMBULATORY_CARE_PROVIDER_SITE_OTHER): Payer: No Typology Code available for payment source | Admitting: Internal Medicine

## 2021-07-22 ENCOUNTER — Encounter: Payer: Self-pay | Admitting: Internal Medicine

## 2021-07-22 ENCOUNTER — Other Ambulatory Visit (HOSPITAL_COMMUNITY): Payer: Self-pay

## 2021-07-22 VITALS — BP 102/60 | HR 73 | Temp 98.4°F | Ht 66.0 in | Wt 170.4 lb

## 2021-07-22 DIAGNOSIS — M25561 Pain in right knee: Secondary | ICD-10-CM

## 2021-07-22 DIAGNOSIS — Z720 Tobacco use: Secondary | ICD-10-CM | POA: Diagnosis not present

## 2021-07-22 DIAGNOSIS — M25521 Pain in right elbow: Secondary | ICD-10-CM

## 2021-07-22 DIAGNOSIS — I10 Essential (primary) hypertension: Secondary | ICD-10-CM | POA: Diagnosis not present

## 2021-07-22 MED ORDER — MELOXICAM 15 MG PO TABS
15.0000 mg | ORAL_TABLET | Freq: Every day | ORAL | 1 refills | Status: DC
  Filled 2021-07-22: qty 90, 90d supply, fill #0

## 2021-07-22 MED ORDER — CYCLOBENZAPRINE HCL 5 MG PO TABS
5.0000 mg | ORAL_TABLET | Freq: Three times a day (TID) | ORAL | 2 refills | Status: DC | PRN
  Filled 2021-07-22: qty 40, 14d supply, fill #0

## 2021-07-22 NOTE — Patient Instructions (Signed)
Please take all new medication as prescribed - the anti-inflammatory, and muscle relaxer as needed  Please continue all other medications as before, and refills have been done if requested.  Please have the pharmacy call with any other refills you may need.  Please keep your appointments with your specialists as you may have planned  You will be contacted regarding the referral for: sport medicine primarily for the right knee and possible baker cyst

## 2021-07-22 NOTE — Progress Notes (Unsigned)
Patient ID: Tasha [name], female   DOB: 1960-09-05, 61 y.o.   MRN: [id number]        Chief Complaint: follow up semiglutide side effects, left sciatica, right knee djd with swelling to post knee, and right elbow tendonitis, left knee djd       HPI:  [name] is a 61 y.o. female here with multiple complaints, had to stop the semiglutide due to GI side effects, left sciatica improved and essentially currently resolved.  Has mild left knee arthritic pain without swelling or giveaways or falls.  Does have mod to severe right knee pain and swelling at times, now with a goose egg type swelling firm and mild tender to post right knee; has hx of burst bakers cyst and fears another now forming .  Also has mild tenderness and soreness to the tricep tendon at the elbow insertion for unclear reason, without swelling or rash.  Pt denies chest pain, increased sob or doe, wheezing, orthopnea, PND, increased LE swelling, palpitations, dizziness or syncope.   Pt denies polydipsia, polyuria, or new focal neuro s/s.  Not taken depakote in 3 yrs, asks for med list removal. Pt still smoking, not ready to quit. Wt Readings from Last 3 Encounters:  07/22/21 170 lb 6.4 oz (77.3 kg)  06/07/21 175 lb (79.4 kg)  04/19/21 176 lb (79.8 kg)   BP Readings from Last 3 Encounters:  07/22/21 102/60  06/07/21 128/62  04/19/21 120/66         Past Medical History:  Diagnosis Date   Allergy    Anemia    past hx    Anxiety state, unspecified    Arthralgia of temporomandibular joint    Arthritis    Asthma    as a child , not now per pt    Conversion disorder    Dementia ([place])    Depressive disorder, not elsewhere classified    Diabetes mellitus without complication ([place])    off all medicines as of 04-16-2019 PV    Dysphagia, unspecified(787.20)    Endometriosis    Esophageal reflux    Fibromyalgia    Headache(784.0)    Heart murmur    past hx murmur    Hx of adenomatous colonic polyps    Hypertension  07/18/2020   Irritable bowel syndrome    Mitral valve disorders(424.0)    Myalgia and myositis, unspecified    Overweight(278.02)    Periodic limb movement disorder    Pure hypercholesterolemia    RLS (restless legs syndrome)    Seizures ([place])    last episode 5 yrs ago    Stroke [facility])    TIA per pt many years ago    Unspecified hypothyroidism    Past Surgical History:  Procedure Laterality Date   ABDOMINAL HYSTERECTOMY     TAH   arm surgery     COLONOSCOPY  04/14/2008   FOOT SURGERY     LIPOMA EXCISION     BESIDES BELLY BUTTON   POLYPECTOMY     HPP x 1   TONSILLECTOMY     UPPER GASTROINTESTINAL ENDOSCOPY  2010   [facility]ner    WRIST SURGERY      reports that she has been smoking cigarettes. She has a 64.50 pack-year smoking history. She has never used smokeless tobacco. She reports current alcohol use. She reports that she does not use drugs. family history includes Breast cancer in her maternal aunt; Colon cancer in her paternal aunt; Colon cancer (age of onset:  47) in her father; Diabetes in her father; Heart disease in her father and mother; Lung cancer in her paternal aunt; Rectal cancer in her paternal aunt; Stomach cancer in her paternal aunt and paternal uncle. Allergies  Allergen Reactions   Farxiga [Dapagliflozin] Other (See Comments)    Vag yeast inf   Metformin And Related Diarrhea   Semaglutide(0.25 Or 0.77m-Dos) Nausea And Vomiting   Statins Other (See Comments)    Muscle weakness/pains   Augmentin [Amoxicillin-Pot Clavulanate] Other (See Comments)    Patient states she recently reported to dentist and was advised to report to providers as allergy.  States she got blisters under her eyes after taking Augmentin.   Crestor [Rosuvastatin] Other (See Comments)    Muscle aches   Lipitor [Atorvastatin] Other (See Comments)    Muscle aches   Zocor [Simvastatin] Other (See Comments)    Leg pain per patient/PE   Current Outpatient Medications on File Prior to Visit   Medication Sig Dispense Refill   albuterol (VENTOLIN HFA) 108 (90 Base) MCG/ACT inhaler Inhale 2 puffs into the lungs every 6 (six) hours as needed for wheezing or shortness of breath. 8.5 g 0   ALPRAZolam (XANAX) 1 MG tablet TAKE 1 TABLET BY MOUTH 3 TIMES DAILY AS NEEDED FOR ANXIETY. 90 tablet 2   buPROPion (WELLBUTRIN XL) 150 MG 24 hr tablet Take 1 tablet by mouth daily. 90 tablet 1   ezetimibe (ZETIA) 10 MG tablet TAKE 1 TABLET BY MOUTH DAILY. 90 tablet 1   levothyroxine (SYNTHROID) 137 MCG tablet Take 1 tablet by mouth daily before breakfast. 90 tablet 1   omega-3 acid ethyl esters (LOVAZA) 1 g capsule Take 2 capsules by mouth 2 (two) times daily.     pioglitazone (ACTOS) 15 MG tablet TAKE 1 TABLET BY MOUTH DAILY 90 tablet 1   potassium chloride SA (KLOR-CON M) 20 MEQ tablet TAKE 1 TABLET BY MOUTH 2 TIMES DAILY. 180 tablet 0   pravastatin (PRAVACHOL) 10 MG tablet TAKE 1 TABLET BY MOUTH ONCE A DAY 90 tablet 1   sertraline (ZOLOFT) 50 MG tablet Take 1 tablet by mouth daily. 90 tablet 0   [DISCONTINUED] ARIPiprazole (ABILIFY) 2 MG tablet Take 1 tablet (2 mg total) by mouth daily. 30 tablet 0   [DISCONTINUED] divalproex (DEPAKOTE) 500 MG DR tablet Take 1 tablet (500 mg total) by mouth 2 (two) times daily. 60 tablet 4   No current facility-administered medications on file prior to visit.        ROS:  All others reviewed and negative.  Objective        PE:  BP 102/60 (BP Location: Left Arm, Patient Position: Sitting, Cuff Size: Large)   Pulse 73   Temp 98.4 F (36.9 C) (Oral)   Ht 5' 6"  (1.676 m)   Wt 170 lb 6.4 oz (77.3 kg)   SpO2 97%   BMI 27.50 kg/m                 Constitutional: Pt appears in NAD               HENT: Head: NCAT.                Right Ear: External ear normal.                 Left Ear: External ear normal.                Eyes: . Pupils are equal, round, and reactive to  light. Conjunctivae and EOM are normal               Nose: without d/c or deformity                Neck: Neck supple. Gross normal ROM               Cardiovascular: Normal rate and regular rhythm.                 Pulmonary/Chest: Effort normal and breath sounds without rales or wheezing.                               Neurological: Pt is alert. At baseline orientation, motor grossly intact               Right knee with deegenerative changes, also post egg sized firm mass swelling mild tender; mild tender right tricep tendon insertion at the elbow, left knee degenerative change without effusion noted               Skin: Skin is warm. No rashes, no other new lesions, LE edema - none               Psychiatric: Pt behavior is normal without agitation   Micro: none  Cardiac tracings I have personally interpreted today:  none  Pertinent Radiological findings (summarize): none   Lab Results  Component Value Date   WBC 7.9 04/08/2021   HGB 14.3 04/08/2021   HCT 42.8 04/08/2021   PLT 163 04/08/2021   GLUCOSE 130 (H) 04/08/2021   CHOL 213 (H) 02/14/2021   TRIG 219.0 (H) 02/14/2021   HDL 47.60 02/14/2021   LDLDIRECT 140.0 02/14/2021   LDLCALC 107 (H) 12/23/2019   ALT 35 04/08/2021   AST 30 04/08/2021   NA 137 04/08/2021   K 3.7 04/08/2021   CL 102 04/08/2021   CREATININE 0.91 04/08/2021   BUN 11 04/08/2021   CO2 26 04/08/2021   TSH 1.07 02/14/2021   INR 1.0 01/27/2019   HGBA1C 6.8 (H) 06/07/2021   MICROALBUR <0.7 07/15/2020   Assessment/Plan:  EVANNY ELLERBE is a 61 y.o. White or Caucasian [1] female with  has a past medical history of Allergy, Anemia, Anxiety state, unspecified, Arthralgia of temporomandibular joint, Arthritis, Asthma, Conversion disorder, Dementia ([place]), Depressive disorder, not elsewhere classified, Diabetes mellitus without complication ([place]), Dysphagia, unspecified(787.20), Endometriosis, Esophageal reflux, Fibromyalgia, Headache(784.0), Heart murmur, adenomatous colonic polyps, Hypertension (07/18/2020), Irritable bowel syndrome, Mitral valve  disorders(424.0), Myalgia and myositis, unspecified, Overweight(278.02), Periodic limb movement disorder, Pure hypercholesterolemia, RLS (restless legs syndrome), Seizures ([place]), Stroke ([place]), and Unspecified hypothyroidism.  Right knee pain Worsening, with probable bakers cyst, for referral sport med, and mobic 15 qd prn,  to f/u any worsening symptoms or concerns    Right elbow pain C/w tendonitis, also for nsaid rn as above  Tobacco abuse Pt counseled to quit, pt not ready  Hypertension BP Readings from Last 3 Encounters:  07/22/21 102/60  06/07/21 128/62  04/19/21 120/66   Stable, pt to continue medical treatment  - diet, wt control  Followup: Return if symptoms worsen or fail to improve.  Cathlean Cower, MD 07/24/2021 8:30 PM [facility]

## 2021-07-24 ENCOUNTER — Encounter: Payer: Self-pay | Admitting: Internal Medicine

## 2021-07-24 DIAGNOSIS — M25521 Pain in right elbow: Secondary | ICD-10-CM | POA: Insufficient documentation

## 2021-07-24 DIAGNOSIS — M25561 Pain in right knee: Secondary | ICD-10-CM | POA: Insufficient documentation

## 2021-07-24 NOTE — Assessment & Plan Note (Signed)
Pt counseled to quit, pt not ready

## 2021-07-24 NOTE — Assessment & Plan Note (Signed)
BP Readings from Last 3 Encounters:  07/22/21 102/60  06/07/21 128/62  04/19/21 120/66   Stable, pt to continue medical treatment  - diet, wt control

## 2021-07-24 NOTE — Assessment & Plan Note (Signed)
C/w tendonitis, also for nsaid rn as above

## 2021-07-24 NOTE — Assessment & Plan Note (Signed)
Worsening, with probable bakers cyst, for referral sport med, and mobic 15 qd prn,  to f/u any worsening symptoms or concerns

## 2021-07-28 ENCOUNTER — Other Ambulatory Visit: Payer: Self-pay | Admitting: Internal Medicine

## 2021-07-28 ENCOUNTER — Other Ambulatory Visit (HOSPITAL_COMMUNITY): Payer: Self-pay

## 2021-07-28 DIAGNOSIS — E876 Hypokalemia: Secondary | ICD-10-CM

## 2021-07-28 DIAGNOSIS — K219 Gastro-esophageal reflux disease without esophagitis: Secondary | ICD-10-CM

## 2021-07-31 ENCOUNTER — Other Ambulatory Visit: Payer: Self-pay | Admitting: Internal Medicine

## 2021-07-31 DIAGNOSIS — E876 Hypokalemia: Secondary | ICD-10-CM

## 2021-07-31 DIAGNOSIS — I10 Essential (primary) hypertension: Secondary | ICD-10-CM

## 2021-07-31 MED ORDER — POTASSIUM CHLORIDE CRYS ER 20 MEQ PO TBCR
EXTENDED_RELEASE_TABLET | Freq: Two times a day (BID) | ORAL | 0 refills | Status: DC
  Filled 2021-07-31 – 2021-08-26 (×2): qty 180, 90d supply, fill #0

## 2021-08-01 ENCOUNTER — Other Ambulatory Visit (HOSPITAL_COMMUNITY): Payer: Self-pay

## 2021-08-03 ENCOUNTER — Other Ambulatory Visit: Payer: Self-pay | Admitting: Internal Medicine

## 2021-08-18 ENCOUNTER — Other Ambulatory Visit (HOSPITAL_COMMUNITY): Payer: Self-pay

## 2021-08-18 DIAGNOSIS — E782 Mixed hyperlipidemia: Secondary | ICD-10-CM | POA: Diagnosis not present

## 2021-08-18 DIAGNOSIS — E039 Hypothyroidism, unspecified: Secondary | ICD-10-CM | POA: Diagnosis not present

## 2021-08-18 DIAGNOSIS — Z79890 Hormone replacement therapy (postmenopausal): Secondary | ICD-10-CM | POA: Diagnosis not present

## 2021-08-18 DIAGNOSIS — E1165 Type 2 diabetes mellitus with hyperglycemia: Secondary | ICD-10-CM | POA: Diagnosis not present

## 2021-08-18 MED ORDER — PRAVASTATIN SODIUM 10 MG PO TABS
10.0000 mg | ORAL_TABLET | Freq: Every day | ORAL | 1 refills | Status: DC
  Filled 2021-08-18 – 2021-08-26 (×2): qty 90, 90d supply, fill #0

## 2021-08-18 MED ORDER — EZETIMIBE 10 MG PO TABS
10.0000 mg | ORAL_TABLET | Freq: Every day | ORAL | 1 refills | Status: DC
  Filled 2021-08-18 – 2021-08-26 (×2): qty 90, 90d supply, fill #0

## 2021-08-20 ENCOUNTER — Other Ambulatory Visit (HOSPITAL_COMMUNITY): Payer: Self-pay

## 2021-08-22 ENCOUNTER — Ambulatory Visit (INDEPENDENT_AMBULATORY_CARE_PROVIDER_SITE_OTHER): Payer: No Typology Code available for payment source

## 2021-08-22 DIAGNOSIS — Z0000 Encounter for general adult medical examination without abnormal findings: Secondary | ICD-10-CM | POA: Diagnosis not present

## 2021-08-22 DIAGNOSIS — Z1231 Encounter for screening mammogram for malignant neoplasm of breast: Secondary | ICD-10-CM | POA: Diagnosis not present

## 2021-08-22 NOTE — Progress Notes (Signed)
I connected with Tasha [name] today by telephone and verified that I am speaking with the correct person using two identifiers. Location patient: home Location provider: work Persons participating in the virtual visit: patient, provider.   I discussed the limitations, risks, security and privacy concerns of performing an evaluation and management service by telephone and the availability of in person appointments. I also discussed with the patient that there may be a patient responsible charge related to this service. The patient expressed understanding and verbally consented to this telephonic visit.    Interactive audio and video telecommunications were attempted between this provider and patient, however failed, due to patient having technical difficulties OR patient did not have access to video capability.  We continued and completed visit with audio only.  Some vital signs may be absent or patient reported.   Time Spent with patient on telephone encounter: 30 minutes  Subjective:   Tasha [name] is a 61 y.o. female who presents for Medicare Annual (Subsequent) preventive examination.  Review of Systems     Cardiac Risk Factors include: diabetes mellitus;dyslipidemia;hypertension     Objective:    Today's Vitals   08/22/21 1519  PainSc: 8    There is no height or weight on file to calculate BMI.     08/22/2021    3:24 PM 04/08/2021    3:05 PM 10/19/2020   10:12 AM 03/25/2020   12:18 PM 10/02/2019   10:01 AM 10/13/2015    4:34 AM 03/26/2015    6:32 PM  Advanced Directives  Does Patient Have a Medical Advance Directive? No No No No No No No  Would patient like information on creating a medical advance directive? No - Patient declined No - Patient declined No - Patient declined No - Patient declined No - Patient declined  No - patient declined information    Current Medications (verified) Outpatient Encounter Medications as of 08/22/2021  Medication Sig   albuterol  (VENTOLIN HFA) 108 (90 Base) MCG/ACT inhaler Inhale 2 puffs into the lungs every 6 (six) hours as needed for wheezing or shortness of breath.   ALPRAZolam (XANAX) 1 MG tablet TAKE 1 TABLET BY MOUTH 3 TIMES DAILY AS NEEDED FOR ANXIETY.   buPROPion (WELLBUTRIN XL) 150 MG 24 hr tablet Take 1 tablet by mouth daily.   cyclobenzaprine (FLEXERIL) 5 MG tablet Take 1 tablet (5 mg total) by mouth 3 (three) times daily as needed for muscle spasms.   ezetimibe (ZETIA) 10 MG tablet TAKE 1 TABLET BY MOUTH DAILY.   ezetimibe (ZETIA) 10 MG tablet Take 1 tablet (10 mg total) by mouth daily.   levothyroxine (SYNTHROID) 137 MCG tablet Take 1 tablet by mouth daily before breakfast.   meloxicam (MOBIC) 15 MG tablet Take 1 tablet (15 mg total) by mouth daily.   omega-3 acid ethyl esters (LOVAZA) 1 g capsule Take 2 capsules by mouth 2 (two) times daily.   pioglitazone (ACTOS) 15 MG tablet TAKE 1 TABLET BY MOUTH DAILY   potassium chloride SA (KLOR-CON M) 20 MEQ tablet TAKE 1 TABLET BY MOUTH 2 TIMES DAILY.   pravastatin (PRAVACHOL) 10 MG tablet Take 1 tablet (10 mg total) by mouth daily.   sertraline (ZOLOFT) 50 MG tablet Take 1 tablet by mouth daily.   [DISCONTINUED] ARIPiprazole (ABILIFY) 2 MG tablet Take 1 tablet (2 mg total) by mouth daily.   [DISCONTINUED] divalproex (DEPAKOTE) 500 MG DR tablet Take 1 tablet (500 mg total) by mouth 2 (two) times daily.   No facility-administered  encounter medications on file as of 08/22/2021.    Allergies (verified) Farxiga [dapagliflozin], Metformin and related, Semaglutide(0.25 or 0.13m-dos), Statins, Augmentin [amoxicillin-pot clavulanate], Crestor [rosuvastatin], Lipitor [atorvastatin], and Zocor [simvastatin]   History: Past Medical History:  Diagnosis Date   Allergy    Anemia    past hx    Anxiety state, unspecified    Arthralgia of temporomandibular joint    Arthritis    Asthma    as a child , not now per pt    Conversion disorder    Dementia ([facility]    Depressive  disorder, not elsewhere classified    Diabetes mellitus without complication ([facility]    off all medicines as of 04-16-2019 PV    Dysphagia, unspecified(787.20)    Endometriosis    Esophageal reflux    Fibromyalgia    Headache(784.0)    Heart murmur    past hx murmur    Hx of adenomatous colonic polyps    Hypertension 07/18/2020   Irritable bowel syndrome    Mitral valve disorders(424.0)    Myalgia and myositis, unspecified    Overweight(278.02)    Periodic limb movement disorder    Pure hypercholesterolemia    RLS (restless legs syndrome)    Seizures (HCC)    last episode 5 yrs ago    Stroke ([facility]    TIA per pt many years ago    Unspecified hypothyroidism    Past Surgical History:  Procedure Laterality Date   ABDOMINAL HYSTERECTOMY     TAH   arm surgery     COLONOSCOPY  04/14/2008   FOOT SURGERY     LIPOMA EXCISION     BESIDES BELLY BUTTON   POLYPECTOMY     HPP x 1   TONSILLECTOMY     UPPER GASTROINTESTINAL ENDOSCOPY  2010   [facility]    WRIST SURGERY     Family History  Problem Relation Age of Onset   Heart disease Mother    Heart disease Father    Diabetes Father    Colon cancer Father 772      died age 5134from colon cancer spreading per pt    Breast cancer Maternal Aunt    Colon cancer Paternal Aunt    Stomach cancer Paternal A10   Lung cancer Paternal Aunt    Rectal cancer Paternal Aunt    Stomach cancer Paternal Uncle    Colon polyps Neg Hx    Esophageal cancer Neg Hx    Social History   Socioeconomic History   Marital status: Married    Spouse name: [name]   Number of children: 2   Years of education: 12   Highest education level: Not on file  Occupational History   Occupation: Disabled    Employer: [employer]  Tobacco Use   Smoking status: Some Days    Packs/day: 1.50    Years: 43.00    Total pack years: 64.50    Types: Cigarettes   Smokeless tobacco: Never   Tobacco comments:    1-2 per day  Vaping Use   Vaping Use:  Former   Substances: Nicotine, Flavoring  Substance and Sexual Activity   Alcohol use: Yes    Comment: rarely   Drug use: No   Sexual activity: Yes  Other Topics Concern   Not on file  Social History Narrative   Lives w/ husband [name]   Caffeine use: 2 cups coffee per day   Tea-3-4 drinks   Right handed  Social Determinants of Health   Financial Resource Strain: Low Risk  (08/22/2021)   Overall Financial Resource Strain (CARDIA)    Difficulty of Paying Living Expenses: Not hard at all  Food Insecurity: No Food Insecurity (08/22/2021)   Hunger Vital Sign    Worried About Running Out of Food in the Last Year: Never true    Ran Out of Food in the Last Year: Never true  Transportation Needs: No Transportation Needs (08/22/2021)   PRAPARE - Hydrologist (Medical): No    Lack of Transportation (Non-Medical): No  Physical Activity: Not on file  Stress: Stress Concern Present (08/22/2021)   [place]    Feeling of Stress : Very much  Social Connections: Moderately Isolated (08/22/2021)   Social Connection and Isolation Panel [NHANES]    Frequency of Communication with Friends and Family: More than three times a week    Frequency of Social Gatherings with Friends and Family: More than three times a week    Attends Religious Services: Never    Marine scientist or Organizations: No    Attends Music therapist: Never    Marital Status: Married    Tobacco Counseling Ready to quit: Not Answered Counseling given: Not Answered Tobacco comments: 1-2 per day   Clinical Intake:  Pre-visit preparation completed: Yes  Pain : 0-10 Pain Score: 8  Pain Type: Chronic pain Pain Location: Knee Pain Orientation: Left, Right Pain Descriptors / Indicators: Aching, Discomfort, Dull Pain Onset: More than a month ago Pain Frequency: Constant Pain Relieving Factors:  Tylenol Effect of Pain on Daily Activities: Pain can diminish job performance, lower motivation to exercise, and prevent you from completing daily tasks. Pain produces disability and affects the quality of life.  Pain Relieving Factors: Tylenol  BMI - recorded: 27.52 Nutritional Status: BMI > 30  Obese Nutritional Risks: None Diabetes: Yes CBG done?: No Did pt. bring in CBG monitor from home?: No  How often do you need to have someone help you when you read instructions, pamphlets, or other written materials from your doctor or pharmacy?: 1 - Never What is the last grade level you completed in school?: HSG; Beautician courses  Diabetic?   Interpreter Needed?: No  Information entered by :: Lisette Abu, LPN.   Activities of Daily Living    08/22/2021    3:36 PM 10/19/2020   10:12 AM  In your present state of health, do you have any difficulty performing the following activities:  Hearing? 0 0  Vision? 0 0  Difficulty concentrating or making decisions? 1 1  Comment  forgetful at times  Walking or climbing stairs? 0 0  Dressing or bathing? 0 0  Doing errands, shopping? 0 0  Preparing Food and eating ? N N  Using the Toilet? N N  In the past six months, have you accidently leaked urine? Y N  Do you have problems with loss of bowel control? N N  Managing your Medications? N N  Managing your Finances? N N  Housekeeping or managing your Housekeeping? N N    Patient Care Team: Janith Lima, MD as PCP - General (Internal Medicine) [place], Eppie Gibson, RN as [place] any recent [place] you may have received from other than [facility] providers in the past year (date may be approximate).     Assessment:   This is a routine wellness examination for  Tasha [name].  Hearing/Vision screen Hearing Screening - Comments:: Patient denied any hearing difficulty.   No hearing aids.  Vision Screening - Comments:: Patient does wear  corrective lenses/contacts.  Eye exam done by: [place]   Dietary issues and exercise activities discussed: Current Exercise Habits: The patient does not participate in regular exercise at present, Exercise limited by: orthopedic condition(s)   Goals Addressed   None   Depression Screen    08/22/2021    3:36 PM 07/22/2021    9:58 AM 03/15/2021   10:13 AM 02/14/2021    2:34 PM 10/19/2020   10:11 AM 03/25/2020   12:13 PM 01/14/2020   10:14 AM  PHQ 2/9 Scores  PHQ - 2 Score 2 3 0 0 1 3 1   PHQ- 9 Score 15 16 0   15     Fall Risk    08/22/2021    3:25 PM 07/22/2021    9:57 AM 02/14/2021    2:34 PM 10/19/2020   10:11 AM 03/25/2020   12:18 PM  Fall Risk   Falls in the past year? 1 1 1  0 1  Number falls in past yr: 1 1 0  0  Injury with Fall? 1 1 1   0  Risk for fall due to : Impaired balance/gait;Orthopedic patient    No Fall Risks  Follow up Falls prevention discussed        FALL RISK PREVENTION PERTAINING TO THE HOME:  Any stairs in or around the home? Yes  If so, are there any without handrails? No  Home free of loose throw rugs in walkways, pet beds, electrical cords, etc? Yes  Adequate lighting in your home to reduce risk of falls? Yes   ASSISTIVE DEVICES UTILIZED TO PREVENT FALLS:  Life alert? No  Use of a cane, walker or w/c? No  Grab bars in the bathroom? Yes  Shower chair or bench in shower? No  Elevated toilet seat or a handicapped toilet? Yes   TIMED UP AND GO:  Was the test performed? No .  Length of time to ambulate 10 feet: n/a sec.   Appearance of gait: Gait not evaluated during this visit.  Cognitive Function:    09/07/2017    8:58 AM  MMSE - Mini Mental State Exam  Orientation to time 5  Orientation to Place 5  Registration 3  Attention/ Calculation 5  Recall 3  Language- name 2 objects 2  Language- repeat 1  Language- follow 3 step command 3  Language- read & follow direction 1  Write a sentence 1  Copy design 1  Total score 30         08/22/2021    4:20 PM  6CIT Screen  What Year? 0 points  What month? 0 points  What time? 0 points  Count back from 20 0 points  Months in reverse 0 points  Repeat phrase 0 points  Total Score 0 points    Immunizations Immunization History  Administered Date(s) Administered   Hep A / Hep B 10/24/2016, 11/23/2016, 04/23/2017   Influenza Split 12/06/2010, 02/27/2012   Influenza Whole 11/11/2007, 11/17/2009, 12/14/2009   Influenza,inj,Quad PF,6+ Mos 12/19/2012, 10/24/2016, 01/10/2018, 01/27/2019, 10/08/2019, 11/02/2020   Influenza-Unspecified 10/18/2014, 03/25/2016   Moderna Sars-Covid-2 Vaccination 10/10/2019, 11/21/2019   PNEUMOCOCCAL CONJUGATE-20 11/03/2020   Pneumococcal Polysaccharide-23 02/07/2008, 03/19/2017   Tdap 08/17/2011   Zoster Recombinat (Shingrix) 11/02/2020, 02/14/2021    TDAP status: Up to date  Flu Vaccine status: Up to date  Pneumococcal  vaccine status: Up to date  Covid-19 vaccine status: Completed vaccines  Qualifies for Shingles Vaccine? Yes   Zostavax completed No   Shingrix Completed?: Yes  Screening Tests Health Maintenance  Topic Date Due   MAMMOGRAM  01/16/2021   OPHTHALMOLOGY EXAM  01/20/2021   FOOT EXAM  05/01/2021   Diabetic kidney evaluation - Urine ACR  07/15/2021   TETANUS/TDAP  08/16/2021   INFLUENZA VACCINE  09/06/2021   HEMOGLOBIN A1C  12/08/2021   Diabetic kidney evaluation - GFR measurement  04/09/2022   COLONOSCOPY (Pts 45-1yr Insurance coverage will need to be confirmed)  06/23/2022   Hepatitis C Screening  Completed   HIV Screening  Completed   Zoster Vaccines- Shingrix  Completed   HPV VACCINES  Aged Out   COVID-19 Vaccine  Discontinued    Health Maintenance  Health Maintenance Due  Topic Date Due   MAMMOGRAM  01/16/2021   OPHTHALMOLOGY EXAM  01/20/2021   FOOT EXAM  05/01/2021   Diabetic kidney evaluation - Urine ACR  07/15/2021   TETANUS/TDAP  08/16/2021    Colorectal cancer screening: Type of  screening: Colonoscopy. Completed 06/23/2019. Repeat every 3 years  Mammogram status: Ordered 08/22/2021. Pt provided with contact info and advised to call to schedule appt.   Bone Density status: Completed 01/17/2019. Results reflect: Bone density results: OSTEOPENIA. Repeat every 2-3 years.  Lung Cancer Screening: (Low Dose CT Chest recommended if Age 833-80years, 30 pack-year currently smoking OR have quit w/in 15years.) does not qualify.   Lung Cancer Screening Referral: no  Additional Screening:  Hepatitis C Screening: does qualify; Completed 04/10/2016  Vision Screening: Recommended annual ophthalmology exams for early detection of glaucoma and other disorders of the eye. Is the patient up to date with their annual eye exam?  Yes  Who is the provider or what is the name of the office in which the patient attends annual eye exams? [facility] If pt is not established with a provider, would they like to be referred to a provider to establish care? No .   Dental Screening: Recommended annual dental exams for proper oral hygiene  Community Resource Referral / Chronic Care Management: CRR required this visit?  No   CCM required this visit?  No      Plan:     I have personally reviewed and noted the following in the patient's chart:   Medical and social history Use of alcohol, tobacco or illicit drugs  Current medications and supplements including opioid prescriptions.  Functional ability and status Nutritional status Physical activity Advanced directives List of other physicians Hospitalizations, surgeries, and ER visits in previous 12 months Vitals Screenings to include cognitive, depression, and falls Referrals and appointments  In addition, I have reviewed and discussed with patient certain preventive protocols, quality metrics, and best practice recommendations. A written personalized care plan for preventive services as well as general  preventive health recommendations were provided to patient.     SSheral Flow LPN   [id number]  Nurse Notes:  There were no vitals filed for this visit. There is no height or weight on file to calculate BMI.

## 2021-08-22 NOTE — Patient Instructions (Addendum)
[name] , Thank you for taking time to come for your Medicare Wellness Visit. I appreciate your ongoing commitment to your health goals. Please review the following plan we discussed and let me know if I can assist you in the future.   Screening recommendations/referrals: Colonoscopy: 06/23/2019; due every 3 years (due 62/17/2024) Mammogram: 01/17/2019; due every year (order placed 08/22/2021) Bone Density: 01/17/2019; due every 2-3 years (due 01/16/2022) Recommended yearly ophthalmology/optometry visit for glaucoma screening and checkup Recommended yearly dental visit for hygiene and checkup  Vaccinations: Influenza vaccine: 11/02/2020 Pneumococcal vaccine: 03/19/2017, 11/03/2020 Tdap vaccine: 08/17/2011; due every 10 years (due 08/16/2021) Shingles vaccine: 11/02/2020, 02/14/2021   Covid-19: 10/10/2019, 11/21/2019  Advanced directives: No  Conditions/risks identified: Yes  Next appointment: Please schedule your next Medicare Wellness Visit with your Nurse Health Advisor in 1 year by calling [phone].   Preventive Care 62 Years and Older, Female Preventive care refers to lifestyle choices and visits with your health care provider that can promote health and wellness. What does preventive care include? A yearly physical exam. This is also called an annual well check. Dental exams once or twice a year. Routine eye exams. Ask your health care provider how often you should have your eyes checked. Personal lifestyle choices, including: Daily care of your teeth and gums. Regular physical activity. Eating a healthy diet. Avoiding tobacco and drug use. Limiting alcohol use. Practicing safe sex. Taking low-dose aspirin every day. Taking vitamin and mineral supplements as recommended by your health care provider. What happens during an annual well check? The services and screenings done by your health care provider during your annual well check will depend on your age, overall health,  lifestyle risk factors, and family history of disease. Counseling  Your health care provider may ask you questions about your: Alcohol use. Tobacco use. Drug use. Emotional well-being. Home and relationship well-being. Sexual activity. Eating habits. History of falls. Memory and ability to understand (cognition). Work and work Statistician. Reproductive health. Screening  You may have the following tests or measurements: Height, weight, and BMI. Blood pressure. Lipid and cholesterol levels. These may be checked every 5 years, or more frequently if you are over 78 years old. Skin check. Lung cancer screening. You may have this screening every year starting at age 62 if you have a 30-pack-year history of smoking and currently smoke or have quit within the past 15 years. Fecal occult blood test (FOBT) of the stool. You may have this test every year starting at age 62. Flexible sigmoidoscopy or colonoscopy. You may have a sigmoidoscopy every 5 years or a colonoscopy every 10 years starting at age 62. Hepatitis C blood test. Hepatitis B blood test. Sexually transmitted disease (STD) testing. Diabetes screening. This is done by checking your blood sugar (glucose) after you have not eaten for a while (fasting). You may have this done every 1-3 years. Bone density scan. This is done to screen for osteoporosis. You may have this done starting at age 62. Mammogram. This may be done every 1-2 years. Talk to your health care provider about how often you should have regular mammograms. Talk with your health care provider about your test results, treatment options, and if necessary, the need for more tests. Vaccines  Your health care provider may recommend certain vaccines, such as: Influenza vaccine. This is recommended every year. Tetanus, diphtheria, and acellular pertussis (Tdap, Td) vaccine. You may need a Td booster every 10 years. Zoster vaccine. You may need this after age  62. Pneumococcal 13-valent conjugate (PCV13) vaccine. One dose is recommended after age 62. Pneumococcal polysaccharide (PPSV23) vaccine. One dose is recommended after age 62. Talk to your health care provider about which screenings and vaccines you need and how often you need them. This information is not intended to replace advice given to you by your health care provider. Make sure you discuss any questions you have with your health care provider. Document Released: 02/19/2015 Document Revised: 10/13/2015 Document Reviewed: 11/24/2014 Elsevier Interactive Patient Education  2017 [place] Prevention in the Home Falls can cause injuries. They can happen to people of all ages. There are many things you can do to make your home safe and to help prevent falls. What can I do on the outside of my home? Regularly fix the edges of walkways and driveways and fix any cracks. Remove anything that might make you trip as you walk through a door, such as a raised step or threshold. Trim any bushes or trees on the path to your home. Use bright outdoor lighting. Clear any walking paths of anything that might make someone trip, such as rocks or tools. Regularly check to see if handrails are loose or broken. Make sure that both sides of any steps have handrails. Any raised decks and porches should have guardrails on the edges. Have any leaves, snow, or ice cleared regularly. Use sand or salt on walking paths during winter. Clean up any spills in your garage right away. This includes oil or grease spills. What can I do in the bathroom? Use night lights. Install grab bars by the toilet and in the tub and shower. Do not use towel bars as grab bars. Use non-skid mats or decals in the tub or shower. If you need to sit down in the shower, use a plastic, non-slip stool. Keep the floor dry. Clean up any water that spills on the floor as soon as it happens. Remove soap buildup in the tub or shower  regularly. Attach bath mats securely with double-sided non-slip rug tape. Do not have throw rugs and other things on the floor that can make you trip. What can I do in the bedroom? Use night lights. Make sure that you have a light by your bed that is easy to reach. Do not use any sheets or blankets that are too big for your bed. They should not hang down onto the floor. Have a firm chair that has side arms. You can use this for support while you get dressed. Do not have throw rugs and other things on the floor that can make you trip. What can I do in the kitchen? Clean up any spills right away. Avoid walking on wet floors. Keep items that you use a lot in easy-to-reach places. If you need to reach something above you, use a strong step stool that has a grab bar. Keep electrical cords out of the way. Do not use floor polish or wax that makes floors slippery. If you must use wax, use non-skid floor wax. Do not have throw rugs and other things on the floor that can make you trip. What can I do with my stairs? Do not leave any items on the stairs. Make sure that there are handrails on both sides of the stairs and use them. Fix handrails that are broken or loose. Make sure that handrails are as long as the stairways. Check any carpeting to make sure that it is firmly attached to the stairs. Fix any  carpet that is loose or worn. Avoid having throw rugs at the top or bottom of the stairs. If you do have throw rugs, attach them to the floor with carpet tape. Make sure that you have a light switch at the top of the stairs and the bottom of the stairs. If you do not have them, ask someone to add them for you. What else can I do to help prevent falls? Wear shoes that: Do not have high heels. Have rubber bottoms. Are comfortable and fit you well. Are closed at the toe. Do not wear sandals. If you use a stepladder: Make sure that it is fully opened. Do not climb a closed stepladder. Make sure that  both sides of the stepladder are locked into place. Ask someone to hold it for you, if possible. Clearly mark and make sure that you can see: Any grab bars or handrails. First and last steps. Where the edge of each step is. Use tools that help you move around (mobility aids) if they are needed. These include: Canes. Walkers. Scooters. Crutches. Turn on the lights when you go into a dark area. Replace any light bulbs as soon as they burn out. Set up your furniture so you have a clear path. Avoid moving your furniture around. If any of your floors are uneven, fix them. If there are any pets around you, be aware of where they are. Review your medicines with your doctor. Some medicines can make you feel dizzy. This can increase your chance of falling. Ask your doctor what other things that you can do to help prevent falls. This information is not intended to replace advice given to you by your health care provider. Make sure you discuss any questions you have with your health care provider. Document Released: 11/19/2008 Document Revised: 07/01/2015 Document Reviewed: 02/27/2014 Elsevier Interactive Patient Education  2017 [employer].

## 2021-08-24 DIAGNOSIS — E1165 Type 2 diabetes mellitus with hyperglycemia: Secondary | ICD-10-CM | POA: Diagnosis not present

## 2021-08-24 DIAGNOSIS — E782 Mixed hyperlipidemia: Secondary | ICD-10-CM | POA: Diagnosis not present

## 2021-08-24 DIAGNOSIS — E039 Hypothyroidism, unspecified: Secondary | ICD-10-CM | POA: Diagnosis not present

## 2021-08-26 ENCOUNTER — Other Ambulatory Visit: Payer: Self-pay | Admitting: Internal Medicine

## 2021-08-26 ENCOUNTER — Other Ambulatory Visit (HOSPITAL_COMMUNITY): Payer: Self-pay

## 2021-08-26 DIAGNOSIS — E118 Type 2 diabetes mellitus with unspecified complications: Secondary | ICD-10-CM

## 2021-08-26 DIAGNOSIS — K7581 Nonalcoholic steatohepatitis (NASH): Secondary | ICD-10-CM

## 2021-08-26 MED ORDER — PIOGLITAZONE HCL 15 MG PO TABS
ORAL_TABLET | Freq: Every day | ORAL | 1 refills | Status: DC
  Filled 2021-08-26: qty 90, 90d supply, fill #0

## 2021-08-26 MED ORDER — SYNTHROID 137 MCG PO TABS
ORAL_TABLET | ORAL | 3 refills | Status: DC
  Filled 2021-08-26: qty 90, 90d supply, fill #0

## 2021-08-29 ENCOUNTER — Ambulatory Visit: Payer: Self-pay | Admitting: *Deleted

## 2021-08-29 ENCOUNTER — Other Ambulatory Visit (HOSPITAL_COMMUNITY): Payer: Self-pay

## 2021-08-29 NOTE — Patient Outreach (Signed)
[facility]) Care Management  08/29/2021  Tasha [name] [birth year] [id number]   RN Health Coach telephone call to patient.  [employer] compliance verified. Patient does not have a meter and does not monitor blood sugars. Patient is going to call devoted insurance and see which meters are covered. Patient A1C is increasing from 6.8 to 7.4. Per patient she is being started back on rybelsus . She stated she has had pain in the past when taking. Patient is going to try one more time. RN Health Coach case closure. Patient will be referred to Care Coordinator for continue follow up care.    Plan: Referred to Care Coordinator for continue follow up care.   RN Health Coach case closure  [facility] Care Management [phone]

## 2021-08-31 ENCOUNTER — Other Ambulatory Visit (HOSPITAL_COMMUNITY): Payer: Self-pay

## 2021-08-31 DIAGNOSIS — M25571 Pain in right ankle and joints of right foot: Secondary | ICD-10-CM | POA: Diagnosis not present

## 2021-08-31 MED ORDER — CELECOXIB 100 MG PO CAPS
ORAL_CAPSULE | ORAL | 0 refills | Status: DC
Start: 2021-08-31 — End: 2021-09-06
  Filled 2021-08-31: qty 28, 14d supply, fill #0

## 2021-09-05 ENCOUNTER — Other Ambulatory Visit (HOSPITAL_COMMUNITY): Payer: Self-pay

## 2021-09-06 ENCOUNTER — Other Ambulatory Visit: Payer: Self-pay | Admitting: Internal Medicine

## 2021-09-06 ENCOUNTER — Encounter: Payer: Self-pay | Admitting: Plastic Surgery

## 2021-09-06 ENCOUNTER — Other Ambulatory Visit (HOSPITAL_COMMUNITY): Payer: Self-pay

## 2021-09-06 ENCOUNTER — Ambulatory Visit (INDEPENDENT_AMBULATORY_CARE_PROVIDER_SITE_OTHER): Payer: No Typology Code available for payment source | Admitting: Plastic Surgery

## 2021-09-06 DIAGNOSIS — E039 Hypothyroidism, unspecified: Secondary | ICD-10-CM

## 2021-09-06 DIAGNOSIS — S01312A Laceration without foreign body of left ear, initial encounter: Secondary | ICD-10-CM | POA: Diagnosis not present

## 2021-09-06 DIAGNOSIS — S01319A Laceration without foreign body of unspecified ear, initial encounter: Secondary | ICD-10-CM | POA: Insufficient documentation

## 2021-09-06 NOTE — Progress Notes (Signed)
Patient ID: Tasha [name], female    DOB: 1960-12-18, 61 y.o.   MRN: [id number]   Chief Complaint  Patient presents with   Consult         The patient is a 61 year old female here for evaluation of her earlobes.  The patient states that she has torn both earlobes in the past.  She had them repaired.  Recently her grandson pulled on her ear rings and she was concerned that they had both torn.  The right does not appear to be torn but has a crease that makes it look like it is.  She would do well for some filler there to help decrease the wrinkling.  The same for the other side but the other side does have a tear although not complete.  She is also interested in Botox filler and laser.  She also mentioned abdominal work as well.    Review of Systems  Constitutional: Negative.   Eyes: Negative.   Respiratory: Negative.    Cardiovascular: Negative.   Gastrointestinal: Negative.   Endocrine: Negative.   Genitourinary: Negative.   Musculoskeletal: Negative.   Skin: Negative.   Hematological: Negative.     Past Medical History:  Diagnosis Date   Allergy    Anemia    past hx    Anxiety state, unspecified    Arthralgia of temporomandibular joint    Arthritis    Asthma    as a child , not now per pt    Conversion disorder    Dementia ([place])    Depressive disorder, not elsewhere classified    Diabetes mellitus without complication ([place])    off all medicines as of 04-16-2019 PV    Dysphagia, unspecified(787.20)    Endometriosis    Esophageal reflux    Fibromyalgia    Headache(784.0)    Heart murmur    past hx murmur    Hx of adenomatous colonic polyps    Hypertension 07/18/2020   Irritable bowel syndrome    Mitral valve disorders(424.0)    Myalgia and myositis, unspecified    Overweight(278.02)    Periodic limb movement disorder    Pure hypercholesterolemia    RLS (restless legs syndrome)    Seizures ([place])    last episode 5 yrs ago    Stroke [facility])    TIA per pt  many years ago    Unspecified hypothyroidism     Past Surgical History:  Procedure Laterality Date   ABDOMINAL HYSTERECTOMY     TAH   arm surgery     COLONOSCOPY  04/14/2008   FOOT SURGERY     LIPOMA EXCISION     BESIDES BELLY BUTTON   POLYPECTOMY     HPP x 1   TONSILLECTOMY     UPPER GASTROINTESTINAL ENDOSCOPY  2010   [facility]    WRIST SURGERY        Current Outpatient Medications:    albuterol (VENTOLIN HFA) 108 (90 Base) MCG/ACT inhaler, Inhale 2 puffs into the lungs every 6 (six) hours as needed for wheezing or shortness of breath., Disp: 8.5 g, Rfl: 0   ALPRAZolam (XANAX) 1 MG tablet, TAKE 1 TABLET BY MOUTH 3 TIMES DAILY AS NEEDED FOR ANXIETY., Disp: 90 tablet, Rfl: 2   buPROPion (WELLBUTRIN XL) 150 MG 24 hr tablet, Take 1 tablet by mouth daily., Disp: 90 tablet, Rfl: 1   ezetimibe (ZETIA) 10 MG tablet, Take 1 tablet by mouth daily., Disp: 90 tablet, Rfl: 1  pioglitazone (ACTOS) 15 MG tablet, TAKE 1 TABLET BY MOUTH DAILY, Disp: 90 tablet, Rfl: 1   potassium chloride SA (KLOR-CON M) 20 MEQ tablet, TAKE 1 TABLET BY MOUTH 2 TIMES DAILY., Disp: 180 tablet, Rfl: 0   pravastatin (PRAVACHOL) 10 MG tablet, Take 1 tablet (10 mg total) by mouth daily., Disp: 90 tablet, Rfl: 1   sertraline (ZOLOFT) 50 MG tablet, Take 1 tablet by mouth daily., Disp: 90 tablet, Rfl: 0   SYNTHROID 137 MCG tablet, Take 1 tablet (137 mcg total) by mouth daily., Disp: 90 tablet, Rfl: 3   omega-3 acid ethyl esters (LOVAZA) 1 g capsule, Take 2 capsules by mouth 2 (two) times daily. (Patient not taking: Reported on 09/06/2021), Disp: , Rfl:    Objective:   Vitals:   09/06/21 1508  BP: 121/62  Pulse: 69  SpO2: 96%    Physical Exam Vitals reviewed.  Constitutional:      Appearance: Normal appearance.  HENT:     Head: Normocephalic.  Cardiovascular:     Rate and Rhythm: Normal rate.     Pulses: Normal pulses.  Pulmonary:     Effort: Pulmonary effort is normal.  Skin:    Capillary Refill: Capillary  refill takes less than 2 seconds.  Neurological:     Mental Status: She is alert and oriented to person, place, and time.  Psychiatric:        Mood and Affect: Mood normal.        Behavior: Behavior normal.        Thought Content: Thought content normal.     Assessment & Plan:  Torn ear lobe, initial encounter  Recommend repair of left torn earlobe and we will provide her with a quote for filler, Botox, laser and the earlobe repair as well.  Pictures were obtained of the patient and placed in the chart with the patient's or guardian's permission.   [place], DO

## 2021-09-07 ENCOUNTER — Other Ambulatory Visit (HOSPITAL_COMMUNITY): Payer: Self-pay

## 2021-09-08 ENCOUNTER — Other Ambulatory Visit (HOSPITAL_COMMUNITY): Payer: Self-pay

## 2021-09-09 ENCOUNTER — Other Ambulatory Visit (HOSPITAL_COMMUNITY): Payer: Self-pay

## 2021-09-14 ENCOUNTER — Other Ambulatory Visit: Payer: Self-pay | Admitting: Internal Medicine

## 2021-09-14 ENCOUNTER — Other Ambulatory Visit (HOSPITAL_COMMUNITY): Payer: Self-pay

## 2021-09-14 ENCOUNTER — Telehealth: Payer: Self-pay | Admitting: Internal Medicine

## 2021-09-14 DIAGNOSIS — F418 Other specified anxiety disorders: Secondary | ICD-10-CM

## 2021-09-14 MED ORDER — ALPRAZOLAM 1 MG PO TABS
ORAL_TABLET | Freq: Three times a day (TID) | ORAL | 2 refills | Status: DC | PRN
  Filled 2021-09-14: qty 90, 30d supply, fill #0
  Filled 2021-10-15: qty 90, 30d supply, fill #1
  Filled 2021-11-16: qty 90, 30d supply, fill #2

## 2021-09-14 MED ORDER — LEVOTHYROXINE SODIUM 137 MCG PO TABS
ORAL_TABLET | ORAL | 3 refills | Status: DC
  Filled 2021-09-14: qty 90, 90d supply, fill #0

## 2021-09-14 NOTE — Telephone Encounter (Signed)
Patient needs her xanax refilled - Please send to [facility] - Patient states that she is leaving for the beach in an hour.

## 2021-09-20 ENCOUNTER — Encounter: Payer: Self-pay | Admitting: *Deleted

## 2021-09-20 ENCOUNTER — Encounter: Payer: No Typology Code available for payment source | Admitting: *Deleted

## 2021-09-21 NOTE — Telephone Encounter (Signed)
This encounter was created in error - please disregard.

## 2021-09-28 NOTE — Progress Notes (Unsigned)
Patient: Tasha [name] Date of Birth: 07/12/1960  Reason for Visit: Follow up History from: Patient Primary Neurologist: Willis/Chima  ASSESSMENT AND PLAN 61 y.o. year old female   55.  Chronic migraine headache 2.  Anxiety and depression -Worsening migraine headache, daily headache -Start Ajovy for migraine preventative, did not offer Aimovig due to constipation -Try Maxalt for acute headache treatment -Tried and failed: Imitrex, Depakote, propanolol, Zoloft, Wellbutrin -I think she could benefit from seeing a psychiatrist, she will think about it -Encouraged to reach out via [place], I will see her back in 4 to 6 months for revisit  HISTORY OF PRESENT ILLNESS: Today 09/29/21 Tasha [name] is here today for follow-up.  Has not been seen since October 2020.  At that time she was on Depakote for migraines, she stopped due to weight gain.  MRI of the brain was normal in 2019.  Has significant anxiety and depression. Reports migraines have increased last 5 months, felt related to change in anxiety medications. Her dose of Zoloft and Wellbutrin were cut in half. Migraines are to the crown feels like pressure, sensitivity to light and sound, some nausea. Triggered by bright sunlight. Takes Tylenol and Xanax, it doesn't help. Feels are daily. She feels dizzy, feels eye sight is blurry. Sometimes if she touches her head, it feels sore. Takes Xanax 3 times daily. Her anxiety is not good.   Tried and failed: Propanolol, Depakote  HISTORY  11/07/2018 SS: Ms. Bacote is a 61 year old female with history of memory disturbance, anxiety, depression, headache, and visual disturbance.  She remains on Depakote for migraines.  She is also taking Xanax for anxiety 1 mg three times a day as needed. She reports her headaches have improved while taking Depakote, but is still having 3-4 a week.  Her headaches are triggered by anxiety and nervousness.  She has a particularly stressful family situation.  When she gets  nervous, she will develop a visual disturbance, feels like she gets tunnel vision, like a film covers both eyes.  This only occurs as result of anxiety.  Following the event, she will develop a headache.  She will have to take a Xanax and the headache will improve. She is concerned about her weight overtime while on Depakote. She does report issues with her balance. Last week she stumbling while walking.  She presents today for follow-up unaccompanied.  REVIEW OF SYSTEMS: Out of a complete 14 system review of symptoms, the patient complains only of the following symptoms, and all other reviewed systems are negative.  See HPI  ALLERGIES: Allergies  Allergen Reactions   Farxiga [Dapagliflozin] Other (See Comments)    Vag yeast inf   Metformin And Related Diarrhea   Semaglutide(0.25 Or 0.18m-Dos) Nausea And Vomiting   Statins Other (See Comments)    Muscle weakness/pains   Augmentin [Amoxicillin-Pot Clavulanate] Other (See Comments)    Patient states she recently reported to dentist and was advised to report to providers as allergy.  States she got blisters under her eyes after taking Augmentin.   Crestor [Rosuvastatin] Other (See Comments)    Muscle aches   Lipitor [Atorvastatin] Other (See Comments)    Muscle aches   Zocor [Simvastatin] Other (See Comments)    Leg pain per patient/PE    HOME MEDICATIONS: Outpatient Medications Prior to Visit  Medication Sig Dispense Refill   albuterol (VENTOLIN HFA) 108 (90 Base) MCG/ACT inhaler Inhale 2 puffs into the lungs every 6 (six) hours as needed for wheezing or shortness of breath.  8.5 g 0   ALPRAZolam (XANAX) 1 MG tablet TAKE 1 TABLET BY MOUTH 3 TIMES DAILY AS NEEDED FOR ANXIETY. 90 tablet 2   buPROPion (WELLBUTRIN XL) 150 MG 24 hr tablet Take 1 tablet by mouth daily. 90 tablet 1   ezetimibe (ZETIA) 10 MG tablet Take 1 tablet by mouth daily. 90 tablet 1   levothyroxine (SYNTHROID) 137 MCG tablet Take 1 tablet (137 mcg total) by mouth daily.  90 tablet 3   omega-3 acid ethyl esters (LOVAZA) 1 g capsule Take 2 capsules by mouth 2 (two) times daily.     pioglitazone (ACTOS) 15 MG tablet TAKE 1 TABLET BY MOUTH DAILY 90 tablet 1   potassium chloride SA (KLOR-CON M) 20 MEQ tablet TAKE 1 TABLET BY MOUTH 2 TIMES DAILY. 180 tablet 0   pravastatin (PRAVACHOL) 10 MG tablet Take 1 tablet (10 mg total) by mouth daily. 90 tablet 1   Semaglutide 7 MG TABS Take by mouth.     sertraline (ZOLOFT) 50 MG tablet Take 1 tablet by mouth daily. 90 tablet 0   SYNTHROID 137 MCG tablet Take 1 tablet (137 mcg total) by mouth daily. 90 tablet 3   No facility-administered medications prior to visit.    PAST MEDICAL HISTORY: Past Medical History:  Diagnosis Date   Allergy    Anemia    past hx    Anxiety state, unspecified    Arthralgia of temporomandibular joint    Arthritis    Asthma    as a child , not now per pt    Conversion disorder    Dementia ([place])    Depressive disorder, not elsewhere classified    Diabetes mellitus without complication ([place])    off all medicines as of 04-16-2019 PV    Dysphagia, unspecified(787.20)    Endometriosis    Esophageal reflux    Fibromyalgia    Headache(784.0)    Heart murmur    past hx murmur    Hx of adenomatous colonic polyps    Hypertension 07/18/2020   Irritable bowel syndrome    Mitral valve disorders(424.0)    Myalgia and myositis, unspecified    Overweight(278.02)    Periodic limb movement disorder    Pure hypercholesterolemia    RLS (restless legs syndrome)    Seizures ([place])    last episode 5 yrs ago    Stroke [facility])    TIA per pt many years ago    Unspecified hypothyroidism     PAST SURGICAL HISTORY: Past Surgical History:  Procedure Laterality Date   ABDOMINAL HYSTERECTOMY     TAH   arm surgery     COLONOSCOPY  04/14/2008   FOOT SURGERY     LIPOMA EXCISION     BESIDES BELLY BUTTON   POLYPECTOMY     HPP x 1   TONSILLECTOMY     UPPER GASTROINTESTINAL ENDOSCOPY  2010   [facility]     WRIST SURGERY      FAMILY HISTORY: Family History  Problem Relation Age of Onset   Heart disease Mother    Heart disease Father    Diabetes Father    Colon cancer Father 47       died age 39 from colon cancer spreading per pt    Breast cancer Maternal Aunt    Colon cancer Paternal Aunt    Stomach cancer Paternal Aunt    Lung cancer Paternal Aunt    Rectal cancer Paternal Aunt    Stomach cancer Paternal Uncle  Colon polyps Neg Hx    Esophageal cancer Neg Hx     SOCIAL HISTORY: Social History   Socioeconomic History   Marital status: Married    Spouse name: Water quality scientist Jacuinde   Number of children: 2   Years of education: 12   Highest education level: Not on file  Occupational History   Occupation: Disabled    Employer: [employer]  Tobacco Use   Smoking status: Some Days    Packs/day: 1.50    Years: 43.00    Total pack years: 64.50    Types: Cigarettes   Smokeless tobacco: Never   Tobacco comments:    1-2 per day  Vaping Use   Vaping Use: Former   Substances: Nicotine, Flavoring  Substance and Sexual Activity   Alcohol use: Yes    Comment: rarely   Drug use: No   Sexual activity: Yes  Other Topics Concern   Not on file  Social History Narrative   Lives w/ husband Oneida Arenas [place]   Caffeine use: 2 cups coffee per day   Tea-3-4 drinks   Right handed    Social Determinants of Health   Financial Resource Strain: Low Risk  (08/22/2021)   Overall Financial Resource Strain (CARDIA)    Difficulty of Paying Living Expenses: Not hard at all  Food Insecurity: No Food Insecurity (08/22/2021)   Hunger Vital Sign    Worried About Running Out of Food in the Last Year: Never true    Ran Out of Food in the Last Year: Never true  Transportation Needs: No Transportation Needs (08/22/2021)   PRAPARE - Hydrologist (Medical): No    Lack of Transportation (Non-Medical): No  Physical Activity: Not on file  Stress: Stress Concern  Present (08/22/2021)   [place]    Feeling of Stress : Very much  Social Connections: Moderately Isolated (08/22/2021)   Social Connection and Isolation Panel [NHANES]    Frequency of Communication with Friends and Family: More than three times a week    Frequency of Social Gatherings with Friends and Family: More than three times a week    Attends Religious Services: Never    Marine scientist or Organizations: No    Attends Archivist Meetings: Never    Marital Status: Married  Human resources officer Violence: Not At Risk (08/22/2021)   Humiliation, Afraid, Rape, and Kick questionnaire    Fear of Current or Ex-Partner: No    Emotionally Abused: No    Physically Abused: No    Sexually Abused: No    PHYSICAL EXAM  Vitals:   09/29/21 1306  BP: 136/75  Pulse: 64  Weight: 174 lb (78.9 kg)  Height: 5' 7"  (1.702 m)   Body mass index is 27.25 kg/m.  Generalized: Well developed, in no acute distress  Neurological examination  Mentation: Alert oriented to time, place, history taking. Follows all commands speech and language fluent Cranial nerve II-XII: Pupils were equal round reactive to light. Extraocular movements were full, visual field were full on confrontational test. Facial sensation and strength were normal. Head turning and shoulder shrug  were normal and symmetric. Motor: The motor testing reveals 5 over 5 strength of all 4 extremities. Good symmetric motor tone is noted throughout.  Sensory: Sensory testing is intact to soft touch on all 4 extremities. No evidence of extinction is noted.  Coordination: Cerebellar testing reveals good finger-nose-finger and heel-to-shin bilaterally.  Gait  and station: Gait is normal. Tandem gait is normal. Romberg is negative. No drift is seen.  Reflexes: Deep tendon reflexes are symmetric and normal bilaterally.   DIAGNOSTIC DATA (LABS, IMAGING, TESTING) - I  reviewed patient records, labs, notes, testing and imaging myself where available.  Lab Results  Component Value Date   WBC 7.9 04/08/2021   HGB 14.3 04/08/2021   HCT 42.8 04/08/2021   MCV 96.0 04/08/2021   PLT 163 04/08/2021      Component Value Date/Time   NA 137 04/08/2021 1527   K 3.7 04/08/2021 1527   CL 102 04/08/2021 1527   CO2 26 04/08/2021 1527   GLUCOSE 130 (H) 04/08/2021 1527   BUN 11 04/08/2021 1527   CREATININE 0.91 04/08/2021 1527   CALCIUM 9.2 04/08/2021 1527   PROT 7.4 04/08/2021 1527   ALBUMIN 4.3 04/08/2021 1527   AST 30 04/08/2021 1527   ALT 35 04/08/2021 1527   ALKPHOS 75 04/08/2021 1527   BILITOT 0.6 04/08/2021 1527   GFRNONAA >60 04/08/2021 1527   GFRAA >60 05/14/2019 1457   Lab Results  Component Value Date   CHOL 213 (H) 02/14/2021   HDL 47.60 02/14/2021   LDLCALC 107 (H) 12/23/2019   LDLDIRECT 140.0 02/14/2021   TRIG 219.0 (H) 02/14/2021   CHOLHDL 4 02/14/2021   Lab Results  Component Value Date   HGBA1C 6.8 (H) 06/07/2021   Lab Results  Component Value Date   MOLMBEML54 492 04/29/2020   Lab Results  Component Value Date   TSH 1.07 02/14/2021    Butler Denmark, AGNP-C, DNP 09/29/2021, 1:33 PM [facility] [address] [place], [place] [address] [phone]

## 2021-09-29 ENCOUNTER — Other Ambulatory Visit (HOSPITAL_COMMUNITY): Payer: Self-pay

## 2021-09-29 ENCOUNTER — Other Ambulatory Visit: Payer: Self-pay | Admitting: Neurology

## 2021-09-29 ENCOUNTER — Ambulatory Visit (INDEPENDENT_AMBULATORY_CARE_PROVIDER_SITE_OTHER): Payer: No Typology Code available for payment source | Admitting: Neurology

## 2021-09-29 ENCOUNTER — Encounter: Payer: Self-pay | Admitting: Neurology

## 2021-09-29 VITALS — BP 136/75 | HR 64 | Ht 67.0 in | Wt 174.0 lb

## 2021-09-29 DIAGNOSIS — F418 Other specified anxiety disorders: Secondary | ICD-10-CM | POA: Diagnosis not present

## 2021-09-29 DIAGNOSIS — G43001 Migraine without aura, not intractable, with status migrainosus: Secondary | ICD-10-CM

## 2021-09-29 MED ORDER — RIZATRIPTAN BENZOATE 10 MG PO TABS
10.0000 mg | ORAL_TABLET | ORAL | 5 refills | Status: DC | PRN
  Filled 2021-09-29: qty 10, 30d supply, fill #0

## 2021-09-29 MED ORDER — AJOVY 225 MG/1.5ML ~~LOC~~ SOAJ
225.0000 mg | SUBCUTANEOUS | 11 refills | Status: DC
  Filled 2021-09-29: qty 1.5, 30d supply, fill #0

## 2021-09-29 NOTE — Patient Instructions (Signed)
Start Ajovy for migraine prevention  Try Maxalt at onset of headache for acute relief Think about seeing psychiatrist for mood management See you back in 4-6 months  My chart message if you need me

## 2021-09-30 ENCOUNTER — Other Ambulatory Visit (HOSPITAL_COMMUNITY): Payer: Self-pay

## 2021-10-01 ENCOUNTER — Other Ambulatory Visit: Payer: Self-pay | Admitting: Internal Medicine

## 2021-10-01 ENCOUNTER — Other Ambulatory Visit (HOSPITAL_COMMUNITY): Payer: Self-pay

## 2021-10-01 DIAGNOSIS — K219 Gastro-esophageal reflux disease without esophagitis: Secondary | ICD-10-CM

## 2021-10-01 DIAGNOSIS — F418 Other specified anxiety disorders: Secondary | ICD-10-CM

## 2021-10-02 MED ORDER — SERTRALINE HCL 50 MG PO TABS
50.0000 mg | ORAL_TABLET | Freq: Every day | ORAL | 0 refills | Status: DC
  Filled 2021-10-02: qty 90, 90d supply, fill #0

## 2021-10-03 ENCOUNTER — Other Ambulatory Visit (HOSPITAL_COMMUNITY): Payer: Self-pay

## 2021-10-04 ENCOUNTER — Other Ambulatory Visit (HOSPITAL_COMMUNITY): Payer: Self-pay

## 2021-10-05 ENCOUNTER — Telehealth: Payer: Self-pay | Admitting: Neurology

## 2021-10-05 ENCOUNTER — Other Ambulatory Visit (HOSPITAL_COMMUNITY): Payer: Self-pay

## 2021-10-05 ENCOUNTER — Other Ambulatory Visit: Payer: Self-pay | Admitting: Neurology

## 2021-10-05 NOTE — Telephone Encounter (Signed)
Pt reports that someone at my chart help desk told her that a RN could add Judson Roch, NP to her my chart.  Pt is asking if this can be done by office staff.  Phone rep informed pt she would send message because that is not something phone staff is able to do.

## 2021-10-05 NOTE — Telephone Encounter (Signed)
Pt said Fremanezumab-vfrm (AJOVY) 225 MG/1.5ML SOAJ is not covered by insurance.  rizatriptan (MAXALT) 10 MG tablet did not stop my headaches, took tylenol; that helped a little bit. Would like a call from the nurse.

## 2021-10-06 NOTE — Telephone Encounter (Signed)
I attempted to complete the PA on CMM. Reiceved notice that epa could not be completed and to call # [phone], will try later today to get this approved.

## 2021-10-11 ENCOUNTER — Telehealth: Payer: Self-pay | Admitting: Internal Medicine

## 2021-10-11 ENCOUNTER — Other Ambulatory Visit (HOSPITAL_COMMUNITY): Payer: Self-pay

## 2021-10-11 ENCOUNTER — Encounter: Payer: Self-pay | Admitting: Internal Medicine

## 2021-10-11 ENCOUNTER — Ambulatory Visit (INDEPENDENT_AMBULATORY_CARE_PROVIDER_SITE_OTHER): Payer: No Typology Code available for payment source | Admitting: Internal Medicine

## 2021-10-11 VITALS — BP 128/68 | HR 62 | Temp 98.7°F | Wt 175.0 lb

## 2021-10-11 DIAGNOSIS — E781 Pure hyperglyceridemia: Secondary | ICD-10-CM | POA: Diagnosis not present

## 2021-10-11 DIAGNOSIS — E039 Hypothyroidism, unspecified: Secondary | ICD-10-CM | POA: Diagnosis not present

## 2021-10-11 DIAGNOSIS — E118 Type 2 diabetes mellitus with unspecified complications: Secondary | ICD-10-CM | POA: Diagnosis not present

## 2021-10-11 DIAGNOSIS — I10 Essential (primary) hypertension: Secondary | ICD-10-CM

## 2021-10-11 DIAGNOSIS — F333 Major depressive disorder, recurrent, severe with psychotic symptoms: Secondary | ICD-10-CM | POA: Diagnosis not present

## 2021-10-11 DIAGNOSIS — N182 Chronic kidney disease, stage 2 (mild): Secondary | ICD-10-CM

## 2021-10-11 DIAGNOSIS — Z1231 Encounter for screening mammogram for malignant neoplasm of breast: Secondary | ICD-10-CM

## 2021-10-11 LAB — URINALYSIS, ROUTINE W REFLEX MICROSCOPIC
Bilirubin Urine: NEGATIVE
Hgb urine dipstick: NEGATIVE
Ketones, ur: NEGATIVE
Leukocytes,Ua: NEGATIVE
Nitrite: NEGATIVE
Specific Gravity, Urine: 1.025 (ref 1.000–1.030)
Total Protein, Urine: NEGATIVE
Urine Glucose: NEGATIVE
Urobilinogen, UA: 0.2 (ref 0.0–1.0)
pH: 6 (ref 5.0–8.0)

## 2021-10-11 LAB — BASIC METABOLIC PANEL
BUN: 9 mg/dL (ref 6–23)
CO2: 30 mEq/L (ref 19–32)
Calcium: 9.6 mg/dL (ref 8.4–10.5)
Chloride: 102 mEq/L (ref 96–112)
Creatinine, Ser: 1.04 mg/dL (ref 0.40–1.20)
GFR: 57.59 mL/min — ABNORMAL LOW (ref >60.00)
Glucose, Bld: 110 mg/dL — ABNORMAL HIGH (ref 70–99)
Potassium: 4.4 mEq/L (ref 3.5–5.1)
Sodium: 138 mEq/L (ref 135–145)

## 2021-10-11 LAB — LIPID PANEL
Cholesterol: 220 mg/dL — ABNORMAL HIGH (ref 0–200)
HDL: 51.5 mg/dL (ref >39.00)
LDL Cholesterol: 138 mg/dL — ABNORMAL HIGH (ref 0–99)
NonHDL: 168.08
Total CHOL/HDL Ratio: 4
Triglycerides: 149 mg/dL (ref 0.0–149.0)
VLDL: 29.8 mg/dL (ref 0.0–40.0)

## 2021-10-11 LAB — CBC WITH DIFFERENTIAL/PLATELET
Basophils Absolute: 0 10*3/uL (ref 0.0–0.1)
Basophils Relative: 0.3 % (ref 0.0–3.0)
Eosinophils Absolute: 0 10*3/uL (ref 0.0–0.7)
Eosinophils Relative: 0 % (ref 0.0–5.0)
HCT: 45.1 % (ref 36.0–46.0)
Hemoglobin: 15.1 g/dL — ABNORMAL HIGH (ref 12.0–15.0)
Lymphocytes Relative: 42.2 % (ref 12.0–46.0)
Lymphs Abs: 2.7 10*3/uL (ref 0.7–4.0)
MCHC: 33.6 g/dL (ref 30.0–36.0)
MCV: 95.6 fl (ref 78.0–100.0)
Monocytes Absolute: 0.4 10*3/uL (ref 0.1–1.0)
Monocytes Relative: 5.6 % (ref 3.0–12.0)
Neutro Abs: 3.3 10*3/uL (ref 1.4–7.7)
Neutrophils Relative %: 51.9 % (ref 43.0–77.0)
Platelets: 150 10*3/uL (ref 150.0–400.0)
RBC: 4.71 Mil/uL (ref 3.87–5.11)
RDW: 13.8 % (ref 11.5–15.5)
WBC: 6.3 10*3/uL (ref 4.0–10.5)

## 2021-10-11 LAB — MICROALBUMIN / CREATININE URINE RATIO
Creatinine,U: 175.5 mg/dL
Microalb Creat Ratio: 0.5 mg/g (ref 0.0–30.0)
Microalb, Ur: 0.9 mg/dL (ref 0.0–1.9)

## 2021-10-11 LAB — TSH: TSH: 42.11 u[IU]/mL — ABNORMAL HIGH (ref 0.35–5.50)

## 2021-10-11 LAB — HEMOGLOBIN A1C: Hgb A1c MFr Bld: 7.5 % — ABNORMAL HIGH (ref 4.6–6.5)

## 2021-10-11 MED ORDER — VORTIOXETINE HBR 5 MG PO TABS
5.0000 mg | ORAL_TABLET | Freq: Every day | ORAL | 0 refills | Status: DC
  Filled 2021-10-11: qty 30, 30d supply, fill #0

## 2021-10-11 MED ORDER — VORTIOXETINE HBR 5 MG PO TABS: 5.0000 mg | ORAL_TABLET | Freq: Every day | ORAL | 0 refills | Status: DC

## 2021-10-11 MED ORDER — BOOSTRIX 5-2.5-18.5 LF-MCG/0.5 IM SUSP: 0.5000 mL | Freq: Once | INTRAMUSCULAR | 0 refills | Status: AC

## 2021-10-11 MED ORDER — LEVOTHYROXINE SODIUM 175 MCG PO TABS: 175.0000 ug | ORAL_TABLET | Freq: Every day | ORAL | 0 refills | Status: DC

## 2021-10-11 NOTE — Telephone Encounter (Signed)
Patient stopped up front on the way out and said she had updated her pharmacy to [facility] today and that she wanted to make sure that Dr Ronnald Ramp is able to send all of her medicaitions to her new pharmacy. Its [facility] on [address].

## 2021-10-11 NOTE — Progress Notes (Signed)
Subjective:  Patient ID: Tasha [name], female    DOB: October 23, 1960  Age: 61 y.o. MRN: [id number]  CC: Hypertension, Diabetes, and Hypothyroidism   HPI Tasha [name] presents for f/up -  She complains of a 50-monthhistory of intermittent headaches.  She is seeing neurology and is being treated.  She was prescribed Trintellix for anxiety and depression and she says it helped with her headaches but then she stopped taking it because it was too expensive.  She tells me she is currently taking Wellbutrin and sertraline.  She continues to feel poorly with anxiety and depression.  She recently saw an endocrinologist and he said she should take Rybelsus.  She has taken GLP-1 agonists before and they cause intestinal side effects.  She has completed the 3 mg once a day and recently started taking 7 mg once a day but is complaining that the GI side effects are occurring again.  Outpatient Medications Prior to Visit  Medication Sig Dispense Refill   albuterol (VENTOLIN HFA) 108 (90 Base) MCG/ACT inhaler Inhale 2 puffs into the lungs every 6 (six) hours as needed for wheezing or shortness of breath. 8.5 g 0   ALPRAZolam (XANAX) 1 MG tablet TAKE 1 TABLET BY MOUTH 3 TIMES DAILY AS NEEDED FOR ANXIETY. 90 tablet 2   buPROPion (WELLBUTRIN XL) 150 MG 24 hr tablet Take 1 tablet by mouth daily. 90 tablet 1   ezetimibe (ZETIA) 10 MG tablet Take 1 tablet by mouth daily. 90 tablet 1   Fremanezumab-vfrm (AJOVY) 225 MG/1.5ML SOAJ Inject 225 mg into the skin every 30 (thirty) days. 1.68 mL 11   omega-3 acid ethyl esters (LOVAZA) 1 g capsule Take 2 capsules by mouth 2 (two) times daily.     pioglitazone (ACTOS) 15 MG tablet TAKE 1 TABLET BY MOUTH DAILY 90 tablet 1   potassium chloride SA (KLOR-CON M) 20 MEQ tablet TAKE 1 TABLET BY MOUTH 2 TIMES DAILY. 180 tablet 0   pravastatin (PRAVACHOL) 10 MG tablet Take 1 tablet (10 mg total) by mouth daily. 90 tablet 1   rizatriptan (MAXALT) 10 MG tablet Take 1 tablet (10  mg total) by mouth as needed for migraine. May repeat in 2 hours if needed 10 tablet 5   sertraline (ZOLOFT) 50 MG tablet Take 1 tablet by mouth daily. 90 tablet 0   levothyroxine (SYNTHROID) 137 MCG tablet Take 1 tablet (137 mcg total) by mouth daily. 90 tablet 3   Semaglutide 7 MG TABS Take by mouth. (Patient not taking: Reported on 10/11/2021)     No facility-administered medications prior to visit.    ROS Review of Systems  Constitutional:  Positive for unexpected weight change (wt gain). Negative for chills, diaphoresis, fatigue and fever.  HENT: Negative.  Negative for trouble swallowing.   Eyes: Negative.   Respiratory: Negative.    Cardiovascular:  Negative for chest pain, palpitations and leg swelling.  Gastrointestinal:  Positive for constipation, diarrhea and nausea. Negative for abdominal pain and vomiting.  Endocrine: Negative.   Genitourinary: Negative.  Negative for difficulty urinating and dysuria.  Musculoskeletal:  Positive for arthralgias. Negative for joint swelling and myalgias.  Neurological: Negative.  Negative for dizziness, weakness, light-headedness and headaches.  Hematological:  Negative for adenopathy. Does not bruise/bleed easily.  Psychiatric/Behavioral:  Positive for dysphoric mood. Negative for decreased concentration, sleep disturbance and suicidal ideas. The patient is nervous/anxious.     Objective:  BP 128/68 (BP Location: Left Arm, Patient Position: Sitting, Cuff Size: Normal)  Pulse 62   Temp 98.7 F (37.1 C) (Oral)   Wt 175 lb (79.4 kg)   SpO2 (!) 89%   BMI 27.41 kg/m   BP Readings from Last 3 Encounters:  10/11/21 128/68  09/29/21 136/75  09/06/21 121/62    Wt Readings from Last 3 Encounters:  10/11/21 175 lb (79.4 kg)  09/29/21 174 lb (78.9 kg)  09/06/21 170 lb (77.1 kg)    Physical Exam Vitals reviewed.  Constitutional:      Appearance: She is not ill-appearing.  HENT:     Mouth/Throat:     Mouth: Mucous membranes are  moist.  Eyes:     General: No scleral icterus.    Conjunctiva/sclera: Conjunctivae normal.  Cardiovascular:     Rate and Rhythm: Normal rate and regular rhythm.     Heart sounds: No murmur heard. Pulmonary:     Effort: Pulmonary effort is normal.     Breath sounds: No stridor. No wheezing, rhonchi or rales.  Abdominal:     General: Abdomen is flat.     Palpations: There is no mass.     Tenderness: There is no abdominal tenderness. There is no guarding.     Hernia: No hernia is present.  Musculoskeletal:        General: Normal range of motion.     Cervical back: Neck supple.     Right lower leg: No edema.     Left lower leg: No edema.  Lymphadenopathy:     Cervical: No cervical adenopathy.  Skin:    General: Skin is warm and dry.     Coloration: Skin is not pale.  Neurological:     General: No focal deficit present.     Mental Status: She is alert. Mental status is at baseline.  Psychiatric:        Mood and Affect: Mood normal.        Behavior: Behavior normal.     Lab Results  Component Value Date   WBC 6.3 10/11/2021   HGB 15.1 (H) 10/11/2021   HCT 45.1 10/11/2021   PLT 150.0 10/11/2021   GLUCOSE 110 (H) 10/11/2021   CHOL 220 (H) 10/11/2021   TRIG 149.0 10/11/2021   HDL 51.50 10/11/2021   LDLDIRECT 140.0 02/14/2021   LDLCALC 138 (H) 10/11/2021   ALT 35 04/08/2021   AST 30 04/08/2021   NA 138 10/11/2021   K 4.4 10/11/2021   CL 102 10/11/2021   CREATININE 1.04 10/11/2021   BUN 9 10/11/2021   CO2 30 10/11/2021   TSH 42.11 (H) 10/11/2021   INR 1.0 01/27/2019   HGBA1C 7.5 (H) 10/11/2021   MICROALBUR 0.9 10/11/2021    MR ABDOMEN MRCP W WO CONTAST  Result Date: 05/02/2021 CLINICAL DATA:  Right upper quadrant pain, abnormal ultrasound EXAM: MRI ABDOMEN WITHOUT AND WITH CONTRAST (INCLUDING MRCP) TECHNIQUE: Multiplanar multisequence MR imaging of the abdomen was performed both before and after the administration of intravenous contrast. Heavily T2-weighted images  of the biliary and pancreatic ducts were obtained, and three-dimensional MRCP images were rendered by post processing. CONTRAST:  80m MULTIHANCE GADOBENATE DIMEGLUMINE 529 MG/ML IV SOLN COMPARISON:  Abdominal ultrasound 04/08/2021 FINDINGS: Lower chest: No acute findings. Hepatobiliary: Liver is normal in size and contour. There is evidence of mild hepatic steatosis. No suspicious hepatic mass visualized. Gallbladder appears within normal limits. No cholelithiasis. No intra hepatic biliary ductal dilatation. The common bile duct is slightly dilated measuring up to 7 mm in diameter with smooth tapering distally. No  choledocholithiasis visualized. Pancreas: No mass, inflammatory changes, or other parenchymal abnormality identified. Spleen:  Within normal limits in size and appearance. Adrenals/Urinary Tract: Adrenal glands appear normal. No suspicious renal mass or hydronephrosis identified bilaterally. Stomach/Bowel: 2 cm proximal duodenal diverticulum which is just anterior to the common bile duct. No evidence of bowel obstruction. Vascular/Lymphatic: No pathologically enlarged lymph nodes identified. No abdominal aortic aneurysm demonstrated. Other:  No ascites. Musculoskeletal: No suspicious bone lesions identified. IMPRESSION: 1. No cholelithiasis or choledocholithiasis visualized. The common bile duct is minimally dilated measuring 7 mm in diameter with smooth tapering distally. 2. Duodenal diverticulum noted. Electronically Signed   By: Ofilia Neas M.D.   On: 05/02/2021 20:20    Assessment & Plan:   Tasha [name] was seen today for hypertension, diabetes and hypothyroidism.  Diagnoses and all orders for this visit:  Primary hypertension- Her blood pressure is adequately well controlled. -     Basic metabolic panel; Future -     CBC with Differential/Platelet; Future -     TSH; Future -     Urinalysis, Routine w reflex microscopic; Future -     Urinalysis, Routine w reflex microscopic -      TSH -     CBC with Differential/Platelet -     Basic metabolic panel  Acquired hypothyroidism- Her TSH is up to 42 and she complains of weight gain.  Will increase her T4 dosage. -     TSH; Future -     TSH -     levothyroxine (SYNTHROID) 175 MCG tablet; Take 1 tablet (175 mcg total) by mouth daily.  Type II diabetes mellitus with manifestations ([place])- Her blood sugar is adequately well controlled. -     Basic metabolic panel; Future -     Hemoglobin A1c; Future -     Microalbumin / creatinine urine ratio; Future -     HM Diabetes Foot Exam -     Ambulatory referral to Ophthalmology -     Microalbumin / creatinine urine ratio -     Hemoglobin A1c -     Basic metabolic panel  Screening mammogram for breast cancer -     MM DIGITAL SCREENING BILATERAL; Future  Hyperglyceridemia, pure -     Lipid panel; Future -     Lipid panel  Severe episode of recurrent major depressive disorder, with psychotic features ([place])- Will restart Trintellix and slowly increase the dose.  Will taper off of sertraline. -     Discontinue: vortioxetine HBr (TRINTELLIX) 5 MG TABS tablet; Take 1 tablet (5 mg total) by mouth daily. -     vortioxetine HBr (TRINTELLIX) 5 MG TABS tablet; Take 1 tablet (5 mg total) by mouth daily.  Chronic renal disease, stage 2, mildly decreased glomerular filtration rate (GFR) between 60-89 mL/min/1.73 square meter- Her renal function is stable.  Other orders -     Tdap ([place]) 5-2.5-18.5 LF-MCG/0.5 injection; Inject 0.5 mLs into the muscle once for 1 dose.   I have discontinued Tasha L. Yoshino "Kim"'s levothyroxine and Semaglutide. I am also having her start on Boostrix and levothyroxine. Additionally, I am having her maintain her albuterol, omega-3 acid ethyl esters, buPROPion, potassium chloride SA, ezetimibe, pravastatin, pioglitazone, ALPRAZolam, Ajovy, rizatriptan, sertraline, and vortioxetine HBr.  Meds ordered this encounter  Medications   DISCONTD: vortioxetine  HBr (TRINTELLIX) 5 MG TABS tablet    Sig: Take 1 tablet (5 mg total) by mouth daily.    Dispense:  30 tablet    Refill:  0   Tdap (BOOSTRIX) 5-2.5-18.5 LF-MCG/0.5 injection    Sig: Inject 0.5 mLs into the muscle once for 1 dose.    Dispense:  0.5 mL    Refill:  0   levothyroxine (SYNTHROID) 175 MCG tablet    Sig: Take 1 tablet (175 mcg total) by mouth daily.    Dispense:  90 tablet    Refill:  0   vortioxetine HBr (TRINTELLIX) 5 MG TABS tablet    Sig: Take 1 tablet (5 mg total) by mouth daily.    Dispense:  30 tablet    Refill:  0     Follow-up: Return in about 3 months (around 01/10/2022).  [name], MD

## 2021-10-11 NOTE — Patient Instructions (Signed)
                                                                    [email or web address]

## 2021-10-12 ENCOUNTER — Telehealth: Payer: Self-pay | Admitting: Neurology

## 2021-10-12 DIAGNOSIS — F322 Major depressive disorder, single episode, severe without psychotic features: Secondary | ICD-10-CM | POA: Diagnosis not present

## 2021-10-12 DIAGNOSIS — Z6827 Body mass index (BMI) 27.0-27.9, adult: Secondary | ICD-10-CM | POA: Diagnosis not present

## 2021-10-12 DIAGNOSIS — Z008 Encounter for other general examination: Secondary | ICD-10-CM | POA: Diagnosis not present

## 2021-10-12 DIAGNOSIS — E1151 Type 2 diabetes mellitus with diabetic peripheral angiopathy without gangrene: Secondary | ICD-10-CM | POA: Diagnosis not present

## 2021-10-12 DIAGNOSIS — F17210 Nicotine dependence, cigarettes, uncomplicated: Secondary | ICD-10-CM | POA: Diagnosis not present

## 2021-10-12 DIAGNOSIS — J42 Unspecified chronic bronchitis: Secondary | ICD-10-CM | POA: Diagnosis not present

## 2021-10-12 DIAGNOSIS — E785 Hyperlipidemia, unspecified: Secondary | ICD-10-CM | POA: Diagnosis not present

## 2021-10-12 DIAGNOSIS — R2681 Unsteadiness on feet: Secondary | ICD-10-CM | POA: Diagnosis not present

## 2021-10-12 DIAGNOSIS — I700 Atherosclerosis of aorta: Secondary | ICD-10-CM | POA: Diagnosis not present

## 2021-10-12 DIAGNOSIS — E039 Hypothyroidism, unspecified: Secondary | ICD-10-CM | POA: Diagnosis not present

## 2021-10-12 DIAGNOSIS — E663 Overweight: Secondary | ICD-10-CM | POA: Diagnosis not present

## 2021-10-12 DIAGNOSIS — E1169 Type 2 diabetes mellitus with other specified complication: Secondary | ICD-10-CM | POA: Diagnosis not present

## 2021-10-12 DIAGNOSIS — F419 Anxiety disorder, unspecified: Secondary | ICD-10-CM | POA: Diagnosis not present

## 2021-10-12 NOTE — Telephone Encounter (Signed)
I returned the pt's call.  She states since being prescribed the Rizatriptan she has not had any relief from her headaches and dizziness. I advised we are working on getting her Ajovy approved through [employer].  She expressed frustration that she called several times at the beginning of this week w/o call back and I advised the recent message we received was from 10/05/2021 about her Ajovy not being approved and the Rizatriptan not helping. She proceeded to say this was incorrect because she has called multiple times with multiple voicemail being left. I apologized on this. She then expressed frustration that her my chart did not show our clinic has one of her providers and I advised I could send her a test message but otherwise she would need to call the help desk for this.  I advised I would be happy to have Judson Roch review the message and give feed back on how to proceed going forward while we are working on the [employer] and she states she cannot wait till tomorrow because she could be dead. I advised if she needed immediate attention that  the best place to seek care would be the ER and she proceeded to say she would be going to [facility] and ended the call.

## 2021-10-12 NOTE — Telephone Encounter (Signed)
Pt called stating that she has been trying to speak to the provider regarding her headaches and dizziness she is having continuously. She states she called last week and no one has been in touch with her and she is in a lot of pain and the medications are not working for her. Please advise.

## 2021-10-13 ENCOUNTER — Other Ambulatory Visit (HOSPITAL_COMMUNITY): Payer: Self-pay

## 2021-10-13 NOTE — Telephone Encounter (Signed)
Can we offer her Ajovy samples if they are available while we await the PA approval? If the Maxalt helps the headache, she can continue  that, however if it offers zero benefit, we can try Imitrex 50 mg.   Needs to be careful that not causing rebound headache with medication overuse. Ideally not taking acute headache treatment more than 2-3 days a week.   If she has tried 2 triptans in the past, we may be able to get Nurtec or [place] approved. I suspect the insurance will require 2 triptans tried before approving newer CGRP for acute treatment.   Another option, If she does get the Ajovy, we could also offer her a sample of Ubrelvy in the interim to hopefully break the cycle of migraine.

## 2021-10-13 NOTE — Telephone Encounter (Signed)
I have reached out the pt via mychart on this follow up message.

## 2021-10-15 ENCOUNTER — Other Ambulatory Visit (HOSPITAL_COMMUNITY): Payer: Self-pay

## 2021-10-15 ENCOUNTER — Emergency Department (HOSPITAL_COMMUNITY)
Admission: EM | Admit: 2021-10-15 | Discharge: 2021-10-15 | Discharge status: Against Medical Advice | Payer: No Typology Code available for payment source | Attending: Emergency Medicine | Admitting: Emergency Medicine

## 2021-10-15 ENCOUNTER — Other Ambulatory Visit: Payer: Self-pay

## 2021-10-15 ENCOUNTER — Encounter (HOSPITAL_COMMUNITY): Payer: Self-pay

## 2021-10-15 DIAGNOSIS — Z0000 Encounter for general adult medical examination without abnormal findings: Secondary | ICD-10-CM | POA: Insufficient documentation

## 2021-10-15 DIAGNOSIS — Z5321 Procedure and treatment not carried out due to patient leaving prior to being seen by health care provider: Secondary | ICD-10-CM | POA: Diagnosis not present

## 2021-10-15 DIAGNOSIS — Z046 Encounter for general psychiatric examination, requested by authority: Secondary | ICD-10-CM | POA: Diagnosis present

## 2021-10-15 LAB — COMPREHENSIVE METABOLIC PANEL
ALT: 28 U/L (ref 0–44)
AST: 38 U/L (ref 15–41)
Albumin: 4.5 g/dL (ref 3.5–5.0)
Alkaline Phosphatase: 63 U/L (ref 38–126)
Anion gap: 8 (ref 5–15)
BUN: 11 mg/dL (ref 8–23)
CO2: 25 mmol/L (ref 22–32)
Calcium: 9.3 mg/dL (ref 8.9–10.3)
Chloride: 106 mmol/L (ref 98–111)
Creatinine, Ser: 1.04 mg/dL — ABNORMAL HIGH (ref 0.44–1.00)
GFR, Estimated: >60 mL/min (ref >60)
Glucose, Bld: 145 mg/dL — ABNORMAL HIGH (ref 70–99)
Potassium: 3.8 mmol/L (ref 3.5–5.1)
Sodium: 139 mmol/L (ref 135–145)
Total Bilirubin: 0.6 mg/dL (ref 0.3–1.2)
Total Protein: 7.6 g/dL (ref 6.5–8.1)

## 2021-10-15 LAB — URINALYSIS, ROUTINE W REFLEX MICROSCOPIC
Bilirubin Urine: NEGATIVE
Glucose, UA: NEGATIVE mg/dL
Hgb urine dipstick: NEGATIVE
Ketones, ur: NEGATIVE mg/dL
Leukocytes,Ua: NEGATIVE
Nitrite: NEGATIVE
Protein, ur: NEGATIVE mg/dL
Specific Gravity, Urine: 1.013 (ref 1.005–1.030)
pH: 5 (ref 5.0–8.0)

## 2021-10-15 LAB — CBC WITH DIFFERENTIAL/PLATELET
Abs Immature Granulocytes: 0.02 10*3/uL (ref 0.00–0.07)
Basophils Absolute: 0 10*3/uL (ref 0.0–0.1)
Basophils Relative: 1 %
Eosinophils Absolute: 0 10*3/uL (ref 0.0–0.5)
Eosinophils Relative: 0 %
HCT: 45.9 % (ref 36.0–46.0)
Hemoglobin: 15 g/dL (ref 12.0–15.0)
Immature Granulocytes: 0 %
Lymphocytes Relative: 40 %
Lymphs Abs: 2.5 10*3/uL (ref 0.7–4.0)
MCH: 31.8 pg (ref 26.0–34.0)
MCHC: 32.7 g/dL (ref 30.0–36.0)
MCV: 97.2 fL (ref 80.0–100.0)
Monocytes Absolute: 0.3 10*3/uL (ref 0.1–1.0)
Monocytes Relative: 5 %
Neutro Abs: 3.4 10*3/uL (ref 1.7–7.7)
Neutrophils Relative %: 54 %
Platelets: 165 10*3/uL (ref 150–400)
RBC: 4.72 MIL/uL (ref 3.87–5.11)
RDW: 13.1 % (ref 11.5–15.5)
WBC: 6.2 10*3/uL (ref 4.0–10.5)
nRBC: 0 % (ref 0.0–0.2)

## 2021-10-15 LAB — ETHANOL: Alcohol, Ethyl (B): <10 mg/dL (ref <10)

## 2021-10-15 LAB — RAPID URINE DRUG SCREEN, HOSP PERFORMED
Amphetamines: NOT DETECTED
Barbiturates: NOT DETECTED
Benzodiazepines: POSITIVE — AB
Cocaine: NOT DETECTED
Opiates: NOT DETECTED
Tetrahydrocannabinol: NOT DETECTED

## 2021-10-15 NOTE — ED Triage Notes (Signed)
"  I have been feeling wild, I have tried to call my neurologist, I think it may be my medicine, I have had nurses on the phone all week. I feel like maybe someone is trying to kill me, I can tell I am spinning and not thinking clearly" per pt Denies S.I or H.I

## 2021-10-15 NOTE — ED Notes (Addendum)
Pt stated was feeling a lot better after using bathroom twice. Encourge pt to wait to be seen, but pt refused and left.

## 2021-10-17 MED ORDER — SUMATRIPTAN SUCCINATE 50 MG PO TABS: 50.0000 mg | ORAL_TABLET | ORAL | 0 refills | Status: DC | PRN

## 2021-10-17 NOTE — Addendum Note (Signed)
Addended by: Verlin Grills on: 10/17/2021 04:45 PM   Modules accepted: Orders

## 2021-11-02 ENCOUNTER — Ambulatory Visit: Payer: No Typology Code available for payment source | Admitting: Neurology

## 2021-11-07 ENCOUNTER — Telehealth: Payer: Self-pay | Admitting: Internal Medicine

## 2021-11-07 NOTE — Telephone Encounter (Signed)
Patient needs a mammogram, a bone density and a referral to [facility]   - Patient has been getting sore in her nose and ears  Patients grand daughter has been diagnosed with a skin disorder called - Molluscum  -

## 2021-11-08 ENCOUNTER — Other Ambulatory Visit: Payer: Self-pay | Admitting: Internal Medicine

## 2021-11-08 DIAGNOSIS — Z124 Encounter for screening for malignant neoplasm of cervix: Secondary | ICD-10-CM

## 2021-11-08 DIAGNOSIS — E2839 Other primary ovarian failure: Secondary | ICD-10-CM

## 2021-11-08 DIAGNOSIS — Z1231 Encounter for screening mammogram for malignant neoplasm of breast: Secondary | ICD-10-CM

## 2021-11-08 NOTE — Progress Notes (Signed)
Tasha [name] prented to the ED with concern that she was feeling wild, paranoid, "spinning" and not thinking clearly. In triage, labs were ordered to target etiology of these symptoms including possible intoxication with drugs or etoh as well as a screening measure in case of need for inpatient psychiatric care.  She denied SI/HI and left without being seen.

## 2021-11-15 DIAGNOSIS — E039 Hypothyroidism, unspecified: Secondary | ICD-10-CM | POA: Diagnosis not present

## 2021-11-15 DIAGNOSIS — E782 Mixed hyperlipidemia: Secondary | ICD-10-CM | POA: Diagnosis not present

## 2021-11-15 DIAGNOSIS — E1165 Type 2 diabetes mellitus with hyperglycemia: Secondary | ICD-10-CM | POA: Diagnosis not present

## 2021-11-16 ENCOUNTER — Encounter: Payer: No Typology Code available for payment source | Admitting: Radiology

## 2021-11-16 ENCOUNTER — Other Ambulatory Visit: Payer: Self-pay | Admitting: Internal Medicine

## 2021-11-16 ENCOUNTER — Other Ambulatory Visit (HOSPITAL_COMMUNITY): Payer: Self-pay

## 2021-11-16 DIAGNOSIS — F333 Major depressive disorder, recurrent, severe with psychotic symptoms: Secondary | ICD-10-CM

## 2021-11-17 ENCOUNTER — Encounter: Payer: No Typology Code available for payment source | Admitting: Radiology

## 2021-11-25 ENCOUNTER — Other Ambulatory Visit (HOSPITAL_COMMUNITY): Payer: Self-pay

## 2021-11-25 MED ORDER — PIOGLITAZONE HCL 30 MG PO TABS
30.0000 mg | ORAL_TABLET | Freq: Every day | ORAL | 1 refills | Status: DC
  Filled 2021-11-25: qty 90, 90d supply, fill #0

## 2021-11-28 ENCOUNTER — Other Ambulatory Visit: Payer: Self-pay | Admitting: Neurology

## 2021-12-01 DIAGNOSIS — H524 Presbyopia: Secondary | ICD-10-CM | POA: Diagnosis not present

## 2021-12-01 DIAGNOSIS — E119 Type 2 diabetes mellitus without complications: Secondary | ICD-10-CM | POA: Diagnosis not present

## 2021-12-01 DIAGNOSIS — H04123 Dry eye syndrome of bilateral lacrimal glands: Secondary | ICD-10-CM | POA: Diagnosis not present

## 2021-12-01 DIAGNOSIS — H52223 Regular astigmatism, bilateral: Secondary | ICD-10-CM | POA: Diagnosis not present

## 2021-12-01 DIAGNOSIS — H35033 Hypertensive retinopathy, bilateral: Secondary | ICD-10-CM | POA: Diagnosis not present

## 2021-12-01 DIAGNOSIS — H2513 Age-related nuclear cataract, bilateral: Secondary | ICD-10-CM | POA: Diagnosis not present

## 2021-12-01 DIAGNOSIS — H5213 Myopia, bilateral: Secondary | ICD-10-CM | POA: Diagnosis not present

## 2021-12-05 ENCOUNTER — Other Ambulatory Visit (HOSPITAL_COMMUNITY): Payer: Self-pay

## 2021-12-10 ENCOUNTER — Encounter (HOSPITAL_COMMUNITY): Payer: Self-pay

## 2021-12-10 ENCOUNTER — Ambulatory Visit (HOSPITAL_COMMUNITY)
Admission: EM | Admit: 2021-12-10 | Discharge: 2021-12-10 | Disposition: A | Discharge status: Home / Self Care | Payer: No Typology Code available for payment source

## 2021-12-10 ENCOUNTER — Other Ambulatory Visit (HOSPITAL_COMMUNITY): Payer: Self-pay

## 2021-12-10 DIAGNOSIS — L0201 Cutaneous abscess of face: Secondary | ICD-10-CM | POA: Diagnosis not present

## 2021-12-10 MED ORDER — CLINDAMYCIN HCL 300 MG PO CAPS
300.0000 mg | ORAL_CAPSULE | Freq: Three times a day (TID) | ORAL | 0 refills | Status: AC
  Filled 2021-12-10 (×2): qty 15, 5d supply, fill #0

## 2021-12-10 MED ORDER — CLINDAMYCIN HCL 300 MG PO CAPS: 300.0000 mg | ORAL_CAPSULE | Freq: Three times a day (TID) | ORAL | 0 refills | Status: DC

## 2021-12-10 NOTE — ED Provider Notes (Signed)
[facility]    CSN: [id number] Arrival date & time: 12/10/21  1122     History   Chief Complaint Chief Complaint  Patient presents with   Mouth Injury    HPI Tasha [name] is a 61 y.o. female.  Presents with mouth pain that began this morning Feels a tender spot in her mouth under top lip Radiates into her cheek and somewhat to the neck  Tender to touch Denies trouble swallowing or breathing No fevers No dental pain  Past Medical History:  Diagnosis Date   Allergy    Anemia    past hx    Anxiety state, unspecified    Arthralgia of temporomandibular joint    Arthritis    Asthma    as a child , not now per pt    Conversion disorder    Dementia ([place])    Depressive disorder, not elsewhere classified    Diabetes mellitus without complication ([place])    off all medicines as of 04-16-2019 PV    Dysphagia, unspecified(787.20)    Endometriosis    Esophageal reflux    Fibromyalgia    Headache(784.0)    Heart murmur    past hx murmur    Hx of adenomatous colonic polyps    Hypertension 07/18/2020   Irritable bowel syndrome    Mitral valve disorders(424.0)    Myalgia and myositis, unspecified    Overweight(278.02)    Periodic limb movement disorder    Pure hypercholesterolemia    RLS (restless legs syndrome)    Seizures ([place])    last episode 5 yrs ago    Stroke [facility])    TIA per pt many years ago    Unspecified hypothyroidism     Patient Active Problem List   Diagnosis Date Noted   Chronic bilateral low back pain with bilateral sciatica 02/24/2021   Need for shingles vaccine 02/14/2021   Chronic idiopathic constipation 02/14/2021   Hypertension 07/18/2020   Pain due to onychomycosis of nail 07/15/2020   Tobacco abuse 07/15/2020   Encounter for general adult medical examination with abnormal findings 05/01/2020   Common bile duct dilation 01/09/2020   Severe episode of recurrent major depressive disorder, with psychotic features ([place]) 10/08/2019    Hyperglyceridemia, pure 08/07/2018   Polyp of colon 07/25/2018   Esophageal dysphagia 07/25/2018   HIV antibody positive ([place]) 09/27/2017   Primary insomnia 04/10/2016   Routine general medical examination at a health care facility 12/20/2014   Visit for screening mammogram 12/16/2014   Cervical cancer screening 12/16/2014   Vitamin D deficiency 04/23/2013   Hyperlipidemia with target LDL less than 130 09/08/2007   CIGARETTE SMOKER 09/04/2007   Hypothyroidism 11/22/2006   GERD 11/22/2006   IBS 11/22/2006    Past Surgical History:  Procedure Laterality Date   ABDOMINAL HYSTERECTOMY     TAH   arm surgery     COLONOSCOPY  04/14/2008   FOOT SURGERY     LIPOMA EXCISION     BESIDES BELLY BUTTON   POLYPECTOMY     HPP x 1   TONSILLECTOMY     UPPER GASTROINTESTINAL ENDOSCOPY  2010   [facility]ner    WRIST SURGERY      OB History     Gravida  5   Para  2   Term      Preterm      AB  3   Living  2      SAB  2   IAB  Ectopic  1   Multiple      Live Births               Home Medications    Prior to Admission medications   Medication Sig Start Date End Date Taking? Authorizing Provider  pioglitazone (ACTOS) 30 MG tablet Take 1 tablet by mouth daily. 11/25/21  Yes [provider]  albuterol (VENTOLIN HFA) 108 (90 Base) MCG/ACT inhaler Inhale 2 puffs into the lungs every 6 (six) hours as needed for wheezing or shortness of breath. 09/02/20   Lucretia Kern, DO  ALPRAZolam Duanne Moron) 1 MG tablet TAKE 1 TABLET BY MOUTH 3 TIMES DAILY AS NEEDED FOR ANXIETY. 09/14/21 03/13/22  Janith Lima, MD  ALPRAZolam Duanne Moron) 1 MG tablet Take by mouth. 11/07/19   [provider]  aspirin EC 81 MG tablet Take 1 tablet by mouth daily.    [provider]  buPROPion (WELLBUTRIN XL) 150 MG 24 hr tablet Take 1 tablet by mouth daily. 06/07/21   Janith Lima, MD  buPROPion (WELLBUTRIN XL) 150 MG 24 hr tablet Take 1 tablet by mouth daily.    [provider]  celecoxib (CELEBREX) 100 MG capsule     [provider]  clindamycin (CLEOCIN) 300 MG capsule Take 1 capsule (300 mg total) by mouth 3 (three) times daily for 5 days. 12/10/21 12/15/21  Markeese Boyajian, Wells Guiles, PA-C  ezetimibe (ZETIA) 10 MG tablet Take 1 tablet by mouth daily. 08/18/21     Fremanezumab-vfrm (AJOVY) 225 MG/1.5ML SOAJ Inject 225 mg into the skin every 30 (thirty) days. 09/29/21   Suzzanne Cloud, NP  glipiZIDE (GLUCOTROL XL) 10 MG 24 hr tablet Take 10 mg by mouth daily. 10/24/21   [provider]  levothyroxine (SYNTHROID) 175 MCG tablet Take 1 tablet (175 mcg total) by mouth daily. 10/11/21   Janith Lima, MD  methocarbamol (ROBAXIN) 500 MG tablet methocarbamol 500 mg tablet  TAKE 1 TABLET BY MOUTH 4 TIMES A DAY    [provider]  omega-3 acid ethyl esters (LOVAZA) 1 g capsule Take 2 capsules by mouth 2 (two) times daily.    [provider]  omega-3 acid ethyl esters (LOVAZA) 1 g capsule     [provider]  pioglitazone (ACTOS) 30 MG tablet Take 1 tablet (30 mg total) by mouth daily. 11/25/21     potassium chloride SA (KLOR-CON M) 20 MEQ tablet TAKE 1 TABLET BY MOUTH 2 TIMES DAILY. 07/31/21 07/31/22  Janith Lima, MD  pravastatin (PRAVACHOL) 10 MG tablet Take 1 tablet (10 mg total) by mouth daily. 08/18/21     rizatriptan (MAXALT) 10 MG tablet Take 1 tablet (10 mg total) by mouth as needed for migraine. May repeat in 2 hours if needed 09/29/21   Suzzanne Cloud, NP  sertraline (ZOLOFT) 50 MG tablet Take 1 tablet by mouth daily. 10/02/21   Janith Lima, MD  SUMAtriptan (IMITREX) 50 MG tablet TAKE 1 TABLET BY MOUTH EVERY 2 (TWO) HOURS AS NEEDED FOR MIGRAINE. MAY REPEAT IN 2 HOURS IF HEADACHE PERSISTS OR RECURS. 11/29/21   Suzzanne Cloud, NP  vortioxetine HBr (TRINTELLIX) 5 MG TABS tablet Take 1 tablet (5 mg total) by mouth daily. 10/11/21   Janith Lima, MD  vortioxetine HBr (TRINTELLIX) 5 MG TABS tablet Take 1 tablet by mouth daily. 11/15/21    [provider]  ARIPiprazole (ABILIFY) 2 MG tablet Take 1 tablet (2 mg total) by mouth daily. 10/10/19 02/27/20  Janith Lima, MD  divalproex (DEPAKOTE) 500 MG DR tablet Take 1 tablet (500 mg total) by mouth 2 (two) times daily. 10/10/19 02/27/20  Janith Lima, MD    Family History Family History  Problem Relation Age of Onset   Heart disease Mother    Heart disease Father    Diabetes Father    Colon cancer Father 4       died age 2 from colon cancer spreading per pt    Breast cancer Maternal Aunt    Colon cancer Paternal Aunt    Stomach cancer Paternal Aunt    Lung cancer Paternal Aunt    Rectal cancer Paternal Aunt    Stomach cancer Paternal Uncle    Colon polyps Neg Hx    Esophageal cancer Neg Hx     Social History Social History   Tobacco Use   Smoking status: Some Days    Packs/day: 1.50    Years: 43.00    Total pack years: 64.50    Types: Cigarettes   Smokeless tobacco: Never   Tobacco comments:    1-2 per day  Vaping Use   Vaping Use: Former   Substances: Nicotine, Flavoring  Substance Use Topics   Alcohol use: Yes    Comment: rarely   Drug use: No     Allergies   Farxiga [dapagliflozin], Metformin and related, Semaglutide, Semaglutide(0.25 or 0.84m-dos), Statins, Augmentin [amoxicillin-pot clavulanate], Crestor [rosuvastatin], Lipitor [atorvastatin], Zocor [simvastatin], and Other   Review of Systems Review of Systems Per HPI  Physical Exam Triage Vital Signs ED Triage Vitals  Enc Vitals Group     BP 12/10/21 1154 (!) 144/82     Pulse Rate 12/10/21 1154 83     Resp 12/10/21 1154 12     Temp 12/10/21 1154 98.3 F (36.8 C)     Temp Source 12/10/21 1154 Oral     SpO2 12/10/21 1154 97 %     Weight --      Height --      Head Circumference --      Peak Flow --      Pain Score 12/10/21 1151 6     Pain Loc --      Pain Edu? --      Excl. in [place] --    No data found.  Updated Vital Signs BP (!) 144/82 (BP Location: Left Arm)    Pulse 83   Temp 98.3 F (36.8 C) (Oral)   Resp 12   SpO2 97%    Physical Exam Constitutional:      General: She is not in acute distress.    Comments: No acute distress, no obvious facial swelling, tolerates secretions and speaking normally  HENT:     Mouth/Throat:     Mouth: Mucous membranes are moist.     Dentition: No dental tenderness, gingival swelling or dental abscesses.     Pharynx: Oropharynx is clear. Uvula midline. No pharyngeal swelling, posterior oropharyngeal erythema or uvula swelling.     Tonsils: No tonsillar exudate or tonsillar abscesses. 0 on the right. 0 on the left.      Comments: Firm, tender area felt in noted area. Lifting the lip causes pain. Some erythema inside. No drainage or fluctuance. Cardiovascular:     Rate and Rhythm: Normal rate and regular rhythm.     Pulses: Normal pulses.     Heart sounds: Normal heart sounds.  Pulmonary:     Effort: Pulmonary effort is normal.  Breath sounds: Normal breath sounds.  Musculoskeletal:     Cervical back: Normal range of motion. No rigidity.  Lymphadenopathy:     Cervical: No cervical adenopathy.  Neurological:     Mental Status: She is alert.      UC Treatments / Results  Labs (all labs ordered are listed, but only abnormal results are displayed) Labs Reviewed - No data to display  EKG  Radiology No results found.  Procedures Procedures (including critical care time)  Medications Ordered in UC Medications - No data to display  Initial Impression / Assessment and Plan / UC Course  I have reviewed the triage vital signs and the nursing notes.  Pertinent labs & imaging results that were available during my care of the patient were reviewed by me and considered in my medical decision making (see chart for details).  Mouth abscess Reports Augmentin caused blisters under her eyes, unsure when. Considered cefdinir vs doxy given mouth/facial area vs skin Will cover for skin and mouth flora  with clinda 300 mg TID x5 days Discussed side effects Recommend return if no improvement with medicine, ED precautions discussed thoroughly for worsening symptoms  Patient agrees to plan  Final Clinical Impressions(s) / UC Diagnoses   Final diagnoses:  Facial abscess     Discharge Instructions      Please take medication as prescribed. 3 times daily, for a total of 5 days. You can probably fit two doses in today since it's already afternoon.  If no improvement after 3 days please return or go to the emergency department if symptoms worsen.     ED Prescriptions     Medication Sig Dispense Auth. Provider   clindamycin (CLEOCIN) 300 MG capsule  (Status: Discontinued) Take 1 capsule (300 mg total) by mouth 3 (three) times daily for 5 days. 15 capsule Johntavius Shepard, PA-C   clindamycin (CLEOCIN) 300 MG capsule Take 1 capsule (300 mg total) by mouth 3 (three) times daily for 5 days. 15 capsule Cambrie Sonnenfeld, Wells Guiles, PA-C      PDMP not reviewed this encounter.   Mazikeen Hehn, Vernice Jefferson 12/10/21 1237

## 2021-12-10 NOTE — Discharge Instructions (Signed)
Please take medication as prescribed. 3 times daily, for a total of 5 days. You can probably fit two doses in today since it's already afternoon.  If no improvement after 3 days please return or go to the emergency department if symptoms worsen.

## 2021-12-10 NOTE — ED Triage Notes (Signed)
Pt is here for a possible abscess in the mouth and down to the throat right side of the face swelling  x5 days

## 2021-12-20 ENCOUNTER — Other Ambulatory Visit (HOSPITAL_COMMUNITY): Payer: Self-pay

## 2021-12-20 DIAGNOSIS — E039 Hypothyroidism, unspecified: Secondary | ICD-10-CM | POA: Diagnosis not present

## 2021-12-20 DIAGNOSIS — Z7984 Long term (current) use of oral hypoglycemic drugs: Secondary | ICD-10-CM | POA: Diagnosis not present

## 2021-12-20 DIAGNOSIS — E782 Mixed hyperlipidemia: Secondary | ICD-10-CM | POA: Diagnosis not present

## 2021-12-20 DIAGNOSIS — E1165 Type 2 diabetes mellitus with hyperglycemia: Secondary | ICD-10-CM | POA: Diagnosis not present

## 2021-12-20 MED ORDER — PRAVASTATIN SODIUM 10 MG PO TABS
10.0000 mg | ORAL_TABLET | Freq: Every day | ORAL | 1 refills | Status: DC
  Filled 2021-12-20: qty 90, 90d supply, fill #0

## 2021-12-20 MED ORDER — EZETIMIBE 10 MG PO TABS
10.0000 mg | ORAL_TABLET | Freq: Every day | ORAL | 1 refills | Status: DC
  Filled 2021-12-20: qty 90, 90d supply, fill #0

## 2021-12-20 MED ORDER — RYBELSUS 7 MG PO TABS
7.0000 mg | ORAL_TABLET | Freq: Every day | ORAL | 5 refills | Status: DC
  Filled 2021-12-20: qty 30, 30d supply, fill #0
  Filled 2022-01-27: qty 30, 30d supply, fill #1

## 2021-12-20 MED ORDER — PIOGLITAZONE HCL 30 MG PO TABS
15.0000 mg | ORAL_TABLET | Freq: Every day | ORAL | 1 refills | Status: DC
  Filled 2021-12-20: qty 45, 45d supply, fill #0

## 2021-12-21 ENCOUNTER — Other Ambulatory Visit (HOSPITAL_COMMUNITY): Payer: Self-pay

## 2021-12-23 ENCOUNTER — Other Ambulatory Visit (HOSPITAL_COMMUNITY): Payer: Self-pay

## 2021-12-23 ENCOUNTER — Other Ambulatory Visit: Payer: Self-pay | Admitting: Internal Medicine

## 2021-12-23 ENCOUNTER — Telehealth: Payer: Self-pay

## 2021-12-23 ENCOUNTER — Telehealth: Payer: Self-pay | Admitting: Internal Medicine

## 2021-12-23 DIAGNOSIS — E039 Hypothyroidism, unspecified: Secondary | ICD-10-CM

## 2021-12-23 DIAGNOSIS — F418 Other specified anxiety disorders: Secondary | ICD-10-CM

## 2021-12-23 NOTE — Telephone Encounter (Signed)
Patient came into the office to request refill for  ALPRAZolam (XANAX) 1 MG tablet to be sent to  [id number] [place] . Is aware that Dr. JRonnald Rampis out of office but would like for u[place]ato check with another provider to see if they can refill medication. Patient spoke with Practice Administrator who assured her we would check with another provider to see if this could be refilled. Last OV with Dr. JRonnald Rampwas 10-11-21, no follow up is scheduled.

## 2021-12-23 NOTE — Telephone Encounter (Signed)
Patient called to follow up on this. She said she really needs today. She also asked for a call back from the nurse. Please advise.

## 2021-12-23 NOTE — Telephone Encounter (Signed)
Pt stopped by requesting refill on Xanax.   Pt established with another PCP on 11/15/21. See office note via Epic. Please advise.

## 2021-12-24 ENCOUNTER — Other Ambulatory Visit (HOSPITAL_COMMUNITY): Payer: Self-pay

## 2021-12-26 ENCOUNTER — Other Ambulatory Visit (HOSPITAL_COMMUNITY): Payer: Self-pay

## 2021-12-26 ENCOUNTER — Other Ambulatory Visit: Payer: Self-pay | Admitting: Internal Medicine

## 2021-12-26 DIAGNOSIS — F411 Generalized anxiety disorder: Secondary | ICD-10-CM

## 2021-12-26 DIAGNOSIS — E039 Hypothyroidism, unspecified: Secondary | ICD-10-CM

## 2021-12-26 DIAGNOSIS — F418 Other specified anxiety disorders: Secondary | ICD-10-CM

## 2021-12-26 MED ORDER — EZETIMIBE 10 MG PO TABS
10.0000 mg | ORAL_TABLET | Freq: Every day | ORAL | 1 refills | Status: DC
  Filled 2021-12-26 – 2022-04-27 (×3): qty 90, 90d supply, fill #0

## 2021-12-26 MED ORDER — ALPRAZOLAM 1 MG PO TABS
1.0000 mg | ORAL_TABLET | Freq: Three times a day (TID) | ORAL | 2 refills | Status: DC | PRN
  Filled 2021-12-26: qty 90, 30d supply, fill #0
  Filled 2022-01-27: qty 90, 30d supply, fill #1
  Filled 2022-02-28: qty 90, 30d supply, fill #2

## 2021-12-26 MED ORDER — LEVOTHYROXINE SODIUM 175 MCG PO TABS
175.0000 ug | ORAL_TABLET | Freq: Every day | ORAL | 0 refills | Status: DC
  Filled 2021-12-26: qty 90, 90d supply, fill #0

## 2021-12-26 NOTE — Telephone Encounter (Signed)
Called pt, LVM.   

## 2021-12-26 NOTE — Telephone Encounter (Signed)
Pt called with Davy Pique at [facility] on the phone. Pt says she was not able to establish care with [place], Doreatha Lew.  Pt says Elon Jester denied taking her on as his patient due to her medication list. Elon Jester advised her to continue care with Dr. Alona Bene as her PCP to continue to manage her medications. Per pt, Elon Jester said she would need to "come off" some of her medications before he could take her on.   REFILL Alprazolam 1 MG, take 1 tablet 3 times daily as needed for anxiety  [place]  Please call pt [phone]

## 2021-12-26 NOTE — Telephone Encounter (Signed)
Patient states that she does not have another PCP and that she would like her medication refill sent in.  Patient is very upset.

## 2021-12-28 LAB — HM DIABETES EYE EXAM

## 2021-12-30 ENCOUNTER — Other Ambulatory Visit (HOSPITAL_COMMUNITY): Payer: Self-pay

## 2022-01-01 ENCOUNTER — Other Ambulatory Visit: Payer: Self-pay | Admitting: Neurology

## 2022-01-11 ENCOUNTER — Telehealth: Payer: Self-pay | Admitting: Internal Medicine

## 2022-01-11 NOTE — Telephone Encounter (Signed)
Pt woke up with black eyes on Monday, sensitive to touch, but no visual impairment, no rashes or bruising. Pt states the did not fall, or do anything out of the ordinary to cause the bruising. States she occasionally gets hives, but doesn't know what causes them.   Pt is requesting a referral to see a dermatologist.

## 2022-01-13 ENCOUNTER — Other Ambulatory Visit (HOSPITAL_COMMUNITY): Payer: Self-pay

## 2022-01-13 ENCOUNTER — Ambulatory Visit (INDEPENDENT_AMBULATORY_CARE_PROVIDER_SITE_OTHER): Payer: No Typology Code available for payment source | Admitting: Family Medicine

## 2022-01-13 ENCOUNTER — Encounter: Payer: Self-pay | Admitting: Family Medicine

## 2022-01-13 VITALS — BP 118/70 | HR 65 | Temp 96.9°F | Ht 67.0 in | Wt 174.2 lb

## 2022-01-13 DIAGNOSIS — L309 Dermatitis, unspecified: Secondary | ICD-10-CM | POA: Diagnosis not present

## 2022-01-13 MED ORDER — MOMETASONE FUROATE 0.1 % EX CREA
1.0000 | TOPICAL_CREAM | Freq: Every day | CUTANEOUS | 0 refills | Status: DC
  Filled 2022-01-13 (×2): qty 15, 15d supply, fill #0

## 2022-01-13 NOTE — Progress Notes (Signed)
[facility] [address] [place] [address] Dept: [phone] Dept Fax: [phone]  Office Visit  Subjective:    Patient ID: Tasha [name], female    DOB: 28-Nov-1960, 61 y.o..   MRN: [id number]  Chief Complaint  Patient presents with   Acute Visit    C/o having black areas under both eyes x 4 days. Has been using a cram with some relief.     History of Present Illness:  Patient is in today complaining of  dark areas that developed under her eyes 4 days ago. She notes she woke up on Monday with dark circles under the eyes. She describes these as looking black, but also notes redness. She has been using an OTC medicated cream on this, but cannot recall the name of the cream. She does recall applying lavender oil to her neck before bedtime the night before, but did not put his on her face.  Past Medical History: Patient Active Problem List   Diagnosis Date Noted   Chronic bilateral low back pain with bilateral sciatica 02/24/2021   Need for shingles vaccine 02/14/2021   Chronic idiopathic constipation 02/14/2021   Hypertension 07/18/2020   Pain due to onychomycosis of nail 07/15/2020   Tobacco abuse 07/15/2020   Encounter for general adult medical examination with abnormal findings 05/01/2020   Common bile duct dilation 01/09/2020   Severe episode of recurrent major depressive disorder, with psychotic features ([place]) 10/08/2019   Hyperglyceridemia, pure 08/07/2018   Polyp of colon 07/25/2018   Esophageal dysphagia 07/25/2018   HIV antibody positive ([place]) 09/27/2017   Primary insomnia 04/10/2016   Routine general medical examination at a health care facility 12/20/2014   Visit for screening mammogram 12/16/2014   Cervical cancer screening 12/16/2014   Vitamin D deficiency 04/23/2013   Hyperlipidemia with target LDL less than 130 09/08/2007   CIGARETTE SMOKER 09/04/2007   Hypothyroidism 11/22/2006   GERD  11/22/2006   IBS 11/22/2006   Past Surgical History:  Procedure Laterality Date   ABDOMINAL HYSTERECTOMY     TAH   arm surgery     COLONOSCOPY  04/14/2008   FOOT SURGERY     LIPOMA EXCISION     BESIDES BELLY BUTTON   POLYPECTOMY     HPP x 1   TONSILLECTOMY     UPPER GASTROINTESTINAL ENDOSCOPY  2010   [facility]    WRIST SURGERY     Family History  Problem Relation Age of Onset   Heart disease Mother    Heart disease Father    Diabetes Father    Colon cancer Father 11       died age 62 from colon cancer spreading per pt    Breast cancer Maternal Aunt    Colon cancer Paternal Aunt    Stomach cancer Paternal Aunt    Lung cancer Paternal Aunt    Rectal cancer Paternal Aunt    Stomach cancer Paternal Uncle    Colon polyps Neg Hx    Esophageal cancer Neg Hx    Outpatient Medications Prior to Visit  Medication Sig Dispense Refill   ALPRAZolam (XANAX) 1 MG tablet Take 1 tablet (1 mg total) by mouth 3 (three) times daily as needed for anxiety. 90 tablet 2   buPROPion (WELLBUTRIN XL) 150 MG 24 hr tablet Take 1 tablet by mouth daily. 90 tablet 1   celecoxib (CELEBREX) 100 MG capsule      ezetimibe (ZETIA) 10 MG tablet Take 1 tablet (  10 mg total) by mouth daily. 90 tablet 1   Fremanezumab-vfrm (AJOVY) 225 MG/1.5ML SOAJ Inject 225 mg into the skin every 30 (thirty) days. 1.68 mL 11   levothyroxine (SYNTHROID) 175 MCG tablet Take 1 tablet (175 mcg total) by mouth daily. 90 tablet 0   methocarbamol (ROBAXIN) 500 MG tablet methocarbamol 500 mg tablet  TAKE 1 TABLET BY MOUTH 4 TIMES A DAY     omega-3 acid ethyl esters (LOVAZA) 1 g capsule Take 2 capsules by mouth 2 (two) times daily.     pioglitazone (ACTOS) 15 MG tablet Take 15 mg by mouth daily.     potassium chloride SA (KLOR-CON M) 20 MEQ tablet TAKE 1 TABLET BY MOUTH 2 TIMES DAILY. 180 tablet 0   pravastatin (PRAVACHOL) 10 MG tablet Take 1 tablet (10 mg total) by mouth daily. 90 tablet 1   Semaglutide (RYBELSUS) 7 MG TABS Take 1  tablet (7 mg total) by mouth daily. 30 tablet 5   vortioxetine HBr (TRINTELLIX) 5 MG TABS tablet Take 1 tablet (5 mg total) by mouth daily. 30 tablet 0   aspirin EC 81 MG tablet Take 1 tablet by mouth daily.     buPROPion (WELLBUTRIN XL) 150 MG 24 hr tablet Take 1 tablet by mouth daily.     glipiZIDE (GLUCOTROL XL) 10 MG 24 hr tablet Take 10 mg by mouth daily.     omega-3 acid ethyl esters (LOVAZA) 1 g capsule      pravastatin (PRAVACHOL) 10 MG tablet Take 1 tablet (10 mg total) by mouth daily. 90 tablet 1   rizatriptan (MAXALT) 10 MG tablet Take 1 tablet (10 mg total) by mouth as needed for migraine. May repeat in 2 hours if needed 10 tablet 5   sertraline (ZOLOFT) 50 MG tablet Take 1 tablet by mouth daily. 90 tablet 0   SUMAtriptan (IMITREX) 50 MG tablet TAKE 1 TABLET BY MOUTH EVERY 2 (TWO) HOURS AS NEEDED FOR MIGRAINE. MAY REPEAT IN 2 HOURS IF HEADACHE PERSISTS OR RECURS. 9 tablet 1   vortioxetine HBr (TRINTELLIX) 5 MG TABS tablet Take 1 tablet by mouth daily.     albuterol (VENTOLIN HFA) 108 (90 Base) MCG/ACT inhaler Inhale 2 puffs into the lungs every 6 (six) hours as needed for wheezing or shortness of breath. (Patient not taking: Reported on 01/13/2022) 8.5 g 0   No facility-administered medications prior to visit.   Allergies  Allergen Reactions   Farxiga [Dapagliflozin] Other (See Comments)    Vag yeast inf   Metformin And Related Diarrhea   Semaglutide Nausea And Vomiting and Other (See Comments)   Semaglutide(0.25 Or 0.39m-Dos) Nausea And Vomiting   Statins Other (See Comments)    Muscle weakness/pains   Augmentin [Amoxicillin-Pot Clavulanate] Other (See Comments)    Patient states she recently reported to dentist and was advised to report to providers as allergy.  States she got blisters under her eyes after taking Augmentin.   Crestor [Rosuvastatin] Other (See Comments)    Muscle aches   Lipitor [Atorvastatin] Other (See Comments)    Muscle aches   Zocor [Simvastatin] Other  (See Comments)    Leg pain per patient/PE   Other Other (See Comments)     Objective:   Today's Vitals   01/13/22 0851  BP: 118/70  Pulse: 65  Temp: (!) 96.9 F (36.1 C)  TempSrc: Temporal  SpO2: 98%  Weight: 174 lb 3.2 oz (79 kg)  Height: 5' 7"  (1.702 m)   Body mass index is  27.28 kg/m.   General: Well developed, well nourished. No acute distress. Skin: Warm and dry. There are two arcs of red, maculopapular rash overlying the zygomatic   arch bilat.  Psych: Alert and oriented. Normal mood and affect.  Health Maintenance Due  Topic Date Due   MAMMOGRAM  01/16/2021   OPHTHALMOLOGY EXAM  01/20/2021   DTaP/Tdap/Td (2 - Td or Tdap) 08/16/2021   Lung Cancer Screening  01/21/2022     Assessment & Plan:   1. Dermatitis of face The rash looks consistent with a contact dermatitis to these areas. This may be secondary to inadvertent transfer oft he lavender oil. I recommend we have her use a steroid cream for a few days until resolved.  - mometasone (ELOCON) 0.1 % cream; Apply topically daily.  Dispense: 15 g; Refill: 0  Return if symptoms worsen or fail to improve.   Haydee Salter, MD

## 2022-01-21 ENCOUNTER — Other Ambulatory Visit: Payer: Self-pay | Admitting: Internal Medicine

## 2022-01-21 DIAGNOSIS — E039 Hypothyroidism, unspecified: Secondary | ICD-10-CM

## 2022-01-23 ENCOUNTER — Other Ambulatory Visit: Payer: Self-pay | Admitting: Internal Medicine

## 2022-01-23 ENCOUNTER — Ambulatory Visit
Admission: RE | Admit: 2022-01-23 | Discharge: 2022-01-23 | Disposition: A | Discharge status: Home / Self Care | Payer: No Typology Code available for payment source | Source: Ambulatory Visit | Attending: Acute Care | Admitting: Acute Care

## 2022-01-23 DIAGNOSIS — F17210 Nicotine dependence, cigarettes, uncomplicated: Secondary | ICD-10-CM

## 2022-01-23 DIAGNOSIS — Z87891 Personal history of nicotine dependence: Secondary | ICD-10-CM

## 2022-01-23 DIAGNOSIS — E2839 Other primary ovarian failure: Secondary | ICD-10-CM

## 2022-01-26 ENCOUNTER — Other Ambulatory Visit: Payer: Self-pay | Admitting: Acute Care

## 2022-01-26 DIAGNOSIS — Z122 Encounter for screening for malignant neoplasm of respiratory organs: Secondary | ICD-10-CM

## 2022-01-26 DIAGNOSIS — F17210 Nicotine dependence, cigarettes, uncomplicated: Secondary | ICD-10-CM

## 2022-01-26 DIAGNOSIS — Z87891 Personal history of nicotine dependence: Secondary | ICD-10-CM

## 2022-01-27 ENCOUNTER — Other Ambulatory Visit: Payer: Self-pay | Admitting: Internal Medicine

## 2022-01-27 ENCOUNTER — Other Ambulatory Visit (HOSPITAL_COMMUNITY): Payer: Self-pay

## 2022-01-27 DIAGNOSIS — I10 Essential (primary) hypertension: Secondary | ICD-10-CM

## 2022-01-27 DIAGNOSIS — F333 Major depressive disorder, recurrent, severe with psychotic symptoms: Secondary | ICD-10-CM

## 2022-01-27 DIAGNOSIS — E876 Hypokalemia: Secondary | ICD-10-CM

## 2022-01-27 MED ORDER — BUPROPION HCL ER (XL) 150 MG PO TB24
150.0000 mg | ORAL_TABLET | Freq: Every day | ORAL | 1 refills | Status: DC
  Filled 2022-01-27: qty 90, 90d supply, fill #0

## 2022-01-27 MED ORDER — POTASSIUM CHLORIDE CRYS ER 20 MEQ PO TBCR
EXTENDED_RELEASE_TABLET | Freq: Two times a day (BID) | ORAL | 0 refills | Status: DC
  Filled 2022-01-27: qty 180, 90d supply, fill #0

## 2022-01-31 ENCOUNTER — Other Ambulatory Visit (HOSPITAL_COMMUNITY): Payer: Self-pay

## 2022-02-01 ENCOUNTER — Telehealth: Payer: No Typology Code available for payment source | Admitting: Neurology

## 2022-02-03 ENCOUNTER — Other Ambulatory Visit: Payer: Self-pay | Admitting: Internal Medicine

## 2022-02-03 ENCOUNTER — Other Ambulatory Visit (HOSPITAL_COMMUNITY): Payer: Self-pay

## 2022-02-03 DIAGNOSIS — F333 Major depressive disorder, recurrent, severe with psychotic symptoms: Secondary | ICD-10-CM

## 2022-02-03 MED ORDER — VORTIOXETINE HBR 5 MG PO TABS
5.0000 mg | ORAL_TABLET | Freq: Every day | ORAL | 0 refills | Status: DC
  Filled 2022-02-03: qty 90, 90d supply, fill #0
  Filled 2022-02-28: qty 1, 1d supply, fill #0
  Filled 2022-02-28: qty 89, 89d supply, fill #0

## 2022-02-03 MED ORDER — ALBUTEROL SULFATE HFA 108 (90 BASE) MCG/ACT IN AERS
2.0000 | INHALATION_SPRAY | Freq: Four times a day (QID) | RESPIRATORY_TRACT | 2 refills | Status: DC | PRN
  Filled 2022-02-03: qty 6.7, 25d supply, fill #0

## 2022-02-03 MED ORDER — ALBUTEROL SULFATE HFA 108 (90 BASE) MCG/ACT IN AERS
INHALATION_SPRAY | RESPIRATORY_TRACT | 0 refills | Status: DC
  Filled 2022-02-03: qty 6.7, 25d supply, fill #0

## 2022-02-03 MED ORDER — ONDANSETRON 4 MG PO TBDP
ORAL_TABLET | ORAL | 0 refills | Status: DC
  Filled 2022-02-03: qty 20, 7d supply, fill #0

## 2022-02-04 DIAGNOSIS — H52223 Regular astigmatism, bilateral: Secondary | ICD-10-CM | POA: Diagnosis not present

## 2022-02-04 DIAGNOSIS — H524 Presbyopia: Secondary | ICD-10-CM | POA: Diagnosis not present

## 2022-02-07 NOTE — Progress Notes (Deleted)
   Virtual Visit via Video Note  I connected with Tasha [name] on 02/07/22 at 11:15 AM EST by a video enabled telemedicine application and verified that I am speaking with the correct person using two identifiers.  Location: Patient: *** Provider: ***   I discussed the limitations of evaluation and management by telemedicine and the availability of in person appointments. The patient expressed understanding and agreed to proceed.  History of Present Illness: Today February 08, 2022 SS:   Update 09/29/21 SS: Tasha [name] is here today for follow-up.  Has not been seen since October 2020.  At that time she was on Depakote for migraines, she stopped due to weight gain.  MRI of the brain was normal in 2019.  Has significant anxiety and depression. Reports migraines have increased last 5 months, felt related to change in anxiety medications. Her dose of Zoloft and Wellbutrin were cut in half. Migraines are to the crown feels like pressure, sensitivity to light and sound, some nausea. Triggered by bright sunlight. Takes Tylenol and Xanax, it doesn't help. Feels are daily. She feels dizzy, feels eye sight is blurry. Sometimes if she touches her head, it feels sore. Takes Xanax 3 times daily. Her anxiety is not good.    Tried and failed: Propanolol, Depakote   Observations/Objective:   Assessment and Plan:   Follow Up Instructions:    I discussed the assessment and treatment plan with the patient. The patient was provided an opportunity to ask questions and all were answered. The patient agreed with the plan and demonstrated an understanding of the instructions.   The patient was advised to call back or seek an in-person evaluation if the symptoms worsen or if the condition fails to improve as anticipated.  I provided *** minutes of non-face-to-face time during this encounter.   Suzzanne Cloud, NP

## 2022-02-08 ENCOUNTER — Telehealth: Payer: Self-pay | Admitting: Neurology

## 2022-02-08 ENCOUNTER — Telehealth: Payer: No Typology Code available for payment source | Admitting: Neurology

## 2022-02-08 NOTE — Telephone Encounter (Signed)
Attempted to contact pt to remind of MyChart Video Visit at 11:15 am. Pt's phone number is not working.

## 2022-02-15 ENCOUNTER — Telehealth: Payer: No Typology Code available for payment source | Admitting: Internal Medicine

## 2022-02-16 ENCOUNTER — Telehealth: Payer: Self-pay

## 2022-02-16 NOTE — Telephone Encounter (Signed)
Pt states she has a cough and mucus that has been lingering since Christmas and wants to know if there is anything she can take OTC. She says she has taken Mucinex and has not gotten any relief.

## 2022-02-17 NOTE — Telephone Encounter (Signed)
Tried to call pt to get her in to see a provider at the advice of Dr. Ronnald Ramp "She needs to be seen with a chest xray." Please sched pt if she calls back.

## 2022-02-24 ENCOUNTER — Other Ambulatory Visit (HOSPITAL_COMMUNITY): Payer: Self-pay

## 2022-02-24 MED ORDER — PIOGLITAZONE HCL 30 MG PO TABS
30.0000 mg | ORAL_TABLET | Freq: Every day | ORAL | 1 refills | Status: DC
  Filled 2022-02-24: qty 90, 90d supply, fill #0

## 2022-02-28 ENCOUNTER — Other Ambulatory Visit (HOSPITAL_COMMUNITY): Payer: Self-pay

## 2022-03-01 ENCOUNTER — Other Ambulatory Visit (HOSPITAL_COMMUNITY): Payer: Self-pay

## 2022-03-02 ENCOUNTER — Other Ambulatory Visit (HOSPITAL_COMMUNITY): Payer: Self-pay

## 2022-03-16 ENCOUNTER — Other Ambulatory Visit (HOSPITAL_COMMUNITY): Payer: Self-pay

## 2022-03-16 ENCOUNTER — Encounter: Payer: Self-pay | Admitting: Internal Medicine

## 2022-03-16 ENCOUNTER — Ambulatory Visit: Payer: No Typology Code available for payment source | Admitting: Internal Medicine

## 2022-03-16 VITALS — BP 144/88 | HR 75 | Temp 98.3°F | Resp 16 | Ht 67.0 in | Wt 175.0 lb

## 2022-03-16 DIAGNOSIS — E118 Type 2 diabetes mellitus with unspecified complications: Secondary | ICD-10-CM | POA: Diagnosis not present

## 2022-03-16 DIAGNOSIS — E039 Hypothyroidism, unspecified: Secondary | ICD-10-CM | POA: Diagnosis not present

## 2022-03-16 DIAGNOSIS — E1159 Type 2 diabetes mellitus with other circulatory complications: Secondary | ICD-10-CM | POA: Diagnosis not present

## 2022-03-16 DIAGNOSIS — J441 Chronic obstructive pulmonary disease with (acute) exacerbation: Secondary | ICD-10-CM | POA: Insufficient documentation

## 2022-03-16 DIAGNOSIS — I10 Essential (primary) hypertension: Secondary | ICD-10-CM

## 2022-03-16 DIAGNOSIS — Z0001 Encounter for general adult medical examination with abnormal findings: Secondary | ICD-10-CM | POA: Diagnosis not present

## 2022-03-16 DIAGNOSIS — I152 Hypertension secondary to endocrine disorders: Secondary | ICD-10-CM | POA: Diagnosis not present

## 2022-03-16 DIAGNOSIS — I2510 Atherosclerotic heart disease of native coronary artery without angina pectoris: Secondary | ICD-10-CM | POA: Diagnosis not present

## 2022-03-16 DIAGNOSIS — J418 Mixed simple and mucopurulent chronic bronchitis: Secondary | ICD-10-CM | POA: Insufficient documentation

## 2022-03-16 DIAGNOSIS — Z1231 Encounter for screening mammogram for malignant neoplasm of breast: Secondary | ICD-10-CM

## 2022-03-16 HISTORY — DX: Type 2 diabetes mellitus with unspecified complications: E11.8

## 2022-03-16 LAB — BASIC METABOLIC PANEL
BUN: 9 mg/dL (ref 6–23)
CO2: 28 mEq/L (ref 19–32)
Calcium: 10 mg/dL (ref 8.4–10.5)
Chloride: 103 mEq/L (ref 96–112)
Creatinine, Ser: 0.78 mg/dL (ref 0.40–1.20)
GFR: 81.09 mL/min (ref >60.00)
Glucose, Bld: 227 mg/dL — ABNORMAL HIGH (ref 70–99)
Potassium: 4.2 mEq/L (ref 3.5–5.1)
Sodium: 141 mEq/L (ref 135–145)

## 2022-03-16 LAB — CBC WITH DIFFERENTIAL/PLATELET
Basophils Absolute: 0 10*3/uL (ref 0.0–0.1)
Basophils Relative: 0.3 % (ref 0.0–3.0)
Eosinophils Absolute: 0 10*3/uL (ref 0.0–0.7)
Eosinophils Relative: 0.1 % (ref 0.0–5.0)
HCT: 44.1 % (ref 36.0–46.0)
Hemoglobin: 14.9 g/dL (ref 12.0–15.0)
Lymphocytes Relative: 39.4 % (ref 12.0–46.0)
Lymphs Abs: 2.5 10*3/uL (ref 0.7–4.0)
MCHC: 33.9 g/dL (ref 30.0–36.0)
MCV: 93.7 fl (ref 78.0–100.0)
Monocytes Absolute: 0.4 10*3/uL (ref 0.1–1.0)
Monocytes Relative: 6 % (ref 3.0–12.0)
Neutro Abs: 3.4 10*3/uL (ref 1.4–7.7)
Neutrophils Relative %: 54.2 % (ref 43.0–77.0)
Platelets: 157 10*3/uL (ref 150.0–400.0)
RBC: 4.71 Mil/uL (ref 3.87–5.11)
RDW: 12.9 % (ref 11.5–15.5)
WBC: 6.3 10*3/uL (ref 4.0–10.5)

## 2022-03-16 LAB — TSH: TSH: 0.05 u[IU]/mL — ABNORMAL LOW (ref 0.35–5.50)

## 2022-03-16 LAB — HEMOGLOBIN A1C: Hgb A1c MFr Bld: 8.1 % — ABNORMAL HIGH (ref 4.6–6.5)

## 2022-03-16 MED ORDER — LEVOTHYROXINE SODIUM 150 MCG PO TABS
150.0000 ug | ORAL_TABLET | Freq: Every day | ORAL | 0 refills | Status: DC
  Filled 2022-03-16: qty 90, 90d supply, fill #0

## 2022-03-16 MED ORDER — OLMESARTAN MEDOXOMIL 20 MG PO TABS
20.0000 mg | ORAL_TABLET | Freq: Every day | ORAL | 0 refills | Status: DC
  Filled 2022-03-16: qty 90, 90d supply, fill #0

## 2022-03-16 MED ORDER — TIRZEPATIDE 2.5 MG/0.5ML ~~LOC~~ SOAJ
2.5000 mg | SUBCUTANEOUS | 0 refills | Status: DC
  Filled 2022-03-16: qty 2, 28d supply, fill #0

## 2022-03-16 MED ORDER — METHYLPREDNISOLONE ACETATE 80 MG/ML IJ SUSP
120.0000 mg | Freq: Once | INTRAMUSCULAR | Status: AC
  Administered 2022-03-16: 120 mg via INTRAMUSCULAR

## 2022-03-16 MED ORDER — TRELEGY ELLIPTA 100-62.5-25 MCG/ACT IN AEPB
1.0000 | INHALATION_SPRAY | Freq: Every day | RESPIRATORY_TRACT | 1 refills | Status: DC
  Filled 2022-03-16: qty 60, 30d supply, fill #0

## 2022-03-16 NOTE — Progress Notes (Signed)
Subjective:  Patient ID: Tasha [name], female    DOB: Feb 01, 1961  Age: 62 y.o. MRN: [id number]  CC: COPD, Hypertension, Diabetes, Hypothyroidism, Hyperlipidemia, and Annual Exam   HPI ZERENITY DURY presents for a CPX and f/up -  She complains of a 55-monthhistory of cough productive of clear to white phlegm with wheezing and frequent use of an albuterol inhaler.  She denies chest pain, diaphoresis, or hemoptysis.  She has intermittent episodes of feeling weak and tired.  She complains of chronic intermittent constipation.  She tells me that every time she coughs she pees.  Outpatient Medications Prior to Visit  Medication Sig Dispense Refill   albuterol (VENTOLIN HFA) 108 (90 Base) MCG/ACT inhaler Inhale 2 puffs into the lungs every 6 (six) hours as needed for wheezing or shortness of breath. 6.7 g 2   albuterol (VENTOLIN HFA) 108 (90 Base) MCG/ACT inhaler Inhale 2 puffs into the lungs every 6 (six) hours as needed for Wheezing. 6.7 g 0   ALPRAZolam (XANAX) 1 MG tablet Take 1 tablet (1 mg total) by mouth 3 (three) times daily as needed for anxiety. 90 tablet 2   buPROPion (WELLBUTRIN XL) 150 MG 24 hr tablet Take 1 tablet by mouth daily. 90 tablet 1   celecoxib (CELEBREX) 100 MG capsule      ezetimibe (ZETIA) 10 MG tablet Take 1 tablet (10 mg total) by mouth daily. 90 tablet 1   Fremanezumab-vfrm (AJOVY) 225 MG/1.5ML SOAJ Inject 225 mg into the skin every 30 (thirty) days. 1.68 mL 11   methocarbamol (ROBAXIN) 500 MG tablet methocarbamol 500 mg tablet  TAKE 1 TABLET BY MOUTH 4 TIMES A DAY     mometasone (ELOCON) 0.1 % cream Apply 1 Application topically daily. 15 g 0   omega-3 acid ethyl esters (LOVAZA) 1 g capsule Take 2 capsules by mouth 2 (two) times daily.     potassium chloride SA (KLOR-CON M) 20 MEQ tablet TAKE 1 TABLET BY MOUTH 2 TIMES DAILY. 180 tablet 0   pravastatin (PRAVACHOL) 10 MG tablet Take 1 tablet (10 mg total) by mouth daily. 90 tablet 1   vortioxetine HBr  (TRINTELLIX) 5 MG TABS tablet Take 1 tablet (5 mg total) by mouth daily. 90 tablet 0   levothyroxine (SYNTHROID) 175 MCG tablet TAKE 1 TABLET BY MOUTH EVERY DAY 90 tablet 0   ondansetron (ZOFRAN-ODT) 4 MG disintegrating tablet Dissolve 1 tablet (4 mg total) by mouth every 8 (eight) hours as needed for Nausea. 20 tablet 0   pioglitazone (ACTOS) 30 MG tablet Take 1 tablet (30 mg total) by mouth daily. 90 tablet 1   Semaglutide (RYBELSUS) 7 MG TABS Take 1 tablet (7 mg total) by mouth daily. 30 tablet 5   pioglitazone (ACTOS) 15 MG tablet Take 15 mg by mouth daily.     No facility-administered medications prior to visit.    ROS Review of Systems  Constitutional:  Positive for fatigue. Negative for appetite change, chills, diaphoresis, fever and unexpected weight change.  HENT: Negative.    Eyes: Negative.   Respiratory:  Positive for cough, shortness of breath and wheezing. Negative for chest tightness and stridor.   Cardiovascular:  Negative for chest pain, palpitations and leg swelling.  Gastrointestinal:  Positive for constipation. Negative for abdominal pain, blood in stool, nausea and vomiting.  Genitourinary: Negative.  Negative for difficulty urinating.  Musculoskeletal:  Positive for arthralgias. Negative for joint swelling and myalgias.  Skin: Negative.   Neurological: Negative.  Negative for  dizziness, weakness, light-headedness and headaches.  Hematological:  Negative for adenopathy. Does not bruise/bleed easily.  Psychiatric/Behavioral: Negative.      Objective:  BP (!) 144/88 (BP Location: Left Arm, Patient Position: Sitting, Cuff Size: Large)   Pulse 75   Temp 98.3 F (36.8 C) (Oral)   Resp 16   Ht 5' 7"$  (1.702 m)   Wt 175 lb (79.4 kg)   SpO2 94%   BMI 27.41 kg/m   BP Readings from Last 3 Encounters:  03/16/22 (!) 144/88  01/13/22 118/70  12/10/21 (!) 144/82    Wt Readings from Last 3 Encounters:  03/16/22 175 lb (79.4 kg)  01/13/22 174 lb 3.2 oz (79 kg)   10/15/21 175 lb (79.4 kg)    Physical Exam Vitals reviewed.  Constitutional:      General: She is not in acute distress.    Appearance: She is not ill-appearing, toxic-appearing or diaphoretic.  HENT:     Mouth/Throat:     Mouth: Mucous membranes are moist.  Eyes:     General: No scleral icterus.    Conjunctiva/sclera: Conjunctivae normal.  Cardiovascular:     Rate and Rhythm: Normal rate and regular rhythm.     Heart sounds: Murmur heard.     Systolic murmur is present with a grade of 1/6.     No diastolic murmur is present.     No friction rub. No gallop.  Pulmonary:     Effort: Pulmonary effort is normal. No tachypnea, accessory muscle usage or respiratory distress.     Breath sounds: No stridor. Examination of the right-upper field reveals wheezing and rhonchi. Examination of the left-upper field reveals wheezing and rhonchi. Examination of the right-middle field reveals wheezing and rhonchi. Examination of the left-middle field reveals wheezing and rhonchi. Examination of the right-lower field reveals wheezing and rhonchi. Examination of the left-lower field reveals wheezing and rhonchi. Wheezing and rhonchi present. No rales.     Comments: There is diffuse expiratory wheezing and rhonchi but good air movement. Abdominal:     Palpations: There is no mass.     Tenderness: There is no abdominal tenderness. There is no guarding or rebound.     Hernia: No hernia is present.  Musculoskeletal:        General: Normal range of motion.     Cervical back: Neck supple.     Right lower leg: No edema.     Left lower leg: No edema.  Lymphadenopathy:     Cervical: No cervical adenopathy.  Skin:    General: Skin is warm and dry.     Coloration: Skin is not pale.     Findings: No rash.  Neurological:     General: No focal deficit present.     Mental Status: She is alert. Mental status is at baseline.  Psychiatric:        Mood and Affect: Mood normal.        Behavior: Behavior  normal.     Lab Results  Component Value Date   WBC 6.3 03/16/2022   HGB 14.9 03/16/2022   HCT 44.1 03/16/2022   PLT 157.0 03/16/2022   GLUCOSE 227 (H) 03/16/2022   CHOL 220 (H) 10/11/2021   TRIG 149.0 10/11/2021   HDL 51.50 10/11/2021   LDLDIRECT 140.0 02/14/2021   LDLCALC 138 (H) 10/11/2021   ALT 28 10/15/2021   AST 38 10/15/2021   NA 141 03/16/2022   K 4.2 03/16/2022   CL 103 03/16/2022   CREATININE 0.78  03/16/2022   BUN 9 03/16/2022   CO2 28 03/16/2022   TSH 0.05 (L) 03/16/2022   INR 1.0 01/27/2019   HGBA1C 8.1 (H) 03/16/2022   MICROALBUR 0.9 10/11/2021    CT CHEST LUNG CA SCREEN LOW DOSE W/O CM  Result Date: 01/26/2022 CLINICAL DATA:  62 year old Caucasian female current smoker with 55 pack-year history of smoking. Lung cancer screening examination. EXAM: CT CHEST WITHOUT CONTRAST LOW-DOSE FOR LUNG CANCER SCREENING TECHNIQUE: Multidetector CT imaging of the chest was performed following the standard protocol without IV contrast. RADIATION DOSE REDUCTION: This exam was performed according to the departmental dose-optimization program which includes automated exposure control, adjustment of the mA and/or kV according to patient size and/or use of iterative reconstruction technique. COMPARISON:  No priors. FINDINGS: Cardiovascular: Heart size is normal. There is no significant pericardial fluid, thickening or pericardial calcification. There is aortic atherosclerosis, as well as atherosclerosis of the great vessels of the mediastinum and the coronary arteries, including calcified atherosclerotic plaque in the left main, left circumflex and right coronary arteries. Mediastinum/Nodes: No pathologically enlarged mediastinal or hilar lymph nodes. Please note that accurate exclusion of hilar adenopathy is limited on noncontrast CT scans. Esophagus is unremarkable in appearance. No axillary lymphadenopathy. Lungs/Pleura: No suspicious appearing pulmonary nodules or masses are noted. No  acute consolidative airspace disease. No pleural effusions. Mild diffuse bronchial wall thickening with very mild centrilobular and paraseptal emphysema. Upper Abdomen: Aortic atherosclerosis. Liver has a shrunken appearance and nodular contour, suggesting developing cirrhosis. Musculoskeletal: There are no aggressive appearing lytic or blastic lesions noted in the visualized portions of the skeleton. IMPRESSION: 1. Lung-RADS 1S, negative. Continue annual screening with low-dose chest CT without contrast in 12 months. 2. The "S" modifier above refers to potentially clinically significant non lung cancer related findings. Specifically, there is aortic atherosclerosis, in addition to left main and 2 vessel coronary artery disease. Please note that although the presence of coronary artery calcium documents the presence of coronary artery disease, the severity of this disease and any potential stenosis cannot be assessed on this non-gated CT examination. Assessment for potential risk factor modification, dietary therapy or pharmacologic therapy may be warranted, if clinically indicated. 3. Mild diffuse bronchial wall thickening with mild centrilobular and paraseptal emphysema; imaging findings suggestive of underlying COPD. 4. Cirrhosis. Aortic Atherosclerosis (ICD10-I70.0) and Emphysema (ICD10-J43.9). Electronically Signed   By: Vinnie Langton M.D.   On: 01/26/2022 07:32   Assessment & Plan:   Fairen was seen today for copd, hypertension, diabetes, hypothyroidism, hyperlipidemia and annual exam.  Diagnoses and all orders for this visit:  Chronic obstructive pulmonary disease with acute exacerbation ([place])- Will treat the acute exacerbation. -     methylPREDNISolone acetate (DEPO-MEDROL) injection 120 mg  Mixed simple and mucopurulent chronic bronchitis ([place])- I recommended that she start using a LAMA/LABA/ICS inhaler. -     Fluticasone-Umeclidin-Vilant (TRELEGY ELLIPTA) 100-62.5-25 MCG/ACT AEPB; Inhale  1 puff into the lungs daily.  Primary hypertension- She has not achieved her blood pressure goal.  Will start an ARB. -     Basic metabolic panel; Future -     CBC with Differential/Platelet; Future -     CBC with Differential/Platelet -     Basic metabolic panel  Encounter for general adult medical examination with abnormal findings- Exam completed, labs reviewed, vaccines reviewed, cancer screenings addressed, patient education was given.  Acquired hypothyroidism- Her TSH is suppressed.  Will lower her T4 dosage. -     TSH; Future -  TSH -     levothyroxine (SYNTHROID) 150 MCG tablet; Take 1 tablet (150 mcg total) by mouth daily.  Type II diabetes mellitus with manifestations ([place])- She has not achieved her blood sugar goal.  Will change the GLP-1 agonist to the combination of the GLP/GIP agonist. -     Hemoglobin A1c; Future -     Hemoglobin A1c -     tirzepatide (MOUNJARO) 2.5 MG/0.5ML Pen; Inject 2.5 mg into the skin once a week.  Visit for screening mammogram  Hypertension associated with diabetes ([place]) -     olmesartan (BENICAR) 20 MG tablet; Take 1 tablet (20 mg total) by mouth daily.  Coronary artery disease involving native coronary artery of native heart without angina pectoris -     CT CORONARY MORPH W/CTA COR W/SCORE W/CA W/CM &/OR WO/CM; Future   I have discontinued Shanterica L. Gloss "Kim"'s Rybelsus, pioglitazone, levothyroxine, ondansetron, and pioglitazone. I am also having her start on olmesartan, Trelegy Ellipta, levothyroxine, and tirzepatide. Additionally, I am having her maintain her omega-3 acid ethyl esters, Ajovy, celecoxib, methocarbamol, pravastatin, ezetimibe, ALPRAZolam, mometasone, buPROPion, potassium chloride SA, albuterol, vortioxetine HBr, and albuterol. We administered methylPREDNISolone acetate.  Meds ordered this encounter  Medications   olmesartan (BENICAR) 20 MG tablet    Sig: Take 1 tablet (20 mg total) by mouth daily.    Dispense:  90  tablet    Refill:  0   Fluticasone-Umeclidin-Vilant (TRELEGY ELLIPTA) 100-62.5-25 MCG/ACT AEPB    Sig: Inhale 1 puff into the lungs daily.    Dispense:  120 each    Refill:  1   methylPREDNISolone acetate (DEPO-MEDROL) injection 120 mg   levothyroxine (SYNTHROID) 150 MCG tablet    Sig: Take 1 tablet (150 mcg total) by mouth daily.    Dispense:  90 tablet    Refill:  0   tirzepatide (MOUNJARO) 2.5 MG/0.5ML Pen    Sig: Inject 2.5 mg into the skin once a week.    Dispense:  2 mL    Refill:  0     Follow-up: Return in about 3 months (around 06/14/2022).  [name], MD

## 2022-03-17 ENCOUNTER — Other Ambulatory Visit (HOSPITAL_COMMUNITY): Payer: Self-pay

## 2022-03-22 ENCOUNTER — Ambulatory Visit
Admission: RE | Admit: 2022-03-22 | Discharge: 2022-03-22 | Disposition: A | Discharge status: Home / Self Care | Payer: No Typology Code available for payment source | Source: Ambulatory Visit | Attending: Internal Medicine | Admitting: Internal Medicine

## 2022-03-22 DIAGNOSIS — Z1231 Encounter for screening mammogram for malignant neoplasm of breast: Secondary | ICD-10-CM

## 2022-03-23 ENCOUNTER — Other Ambulatory Visit (HOSPITAL_COMMUNITY): Payer: No Typology Code available for payment source

## 2022-03-30 ENCOUNTER — Other Ambulatory Visit: Payer: Self-pay | Admitting: Internal Medicine

## 2022-03-30 ENCOUNTER — Telehealth: Payer: Self-pay | Admitting: Internal Medicine

## 2022-03-30 ENCOUNTER — Other Ambulatory Visit (HOSPITAL_COMMUNITY): Payer: Self-pay

## 2022-03-30 DIAGNOSIS — F411 Generalized anxiety disorder: Secondary | ICD-10-CM

## 2022-03-30 MED ORDER — ALPRAZOLAM 1 MG PO TABS
1.0000 mg | ORAL_TABLET | Freq: Three times a day (TID) | ORAL | 0 refills | Status: DC | PRN
  Filled 2022-03-30: qty 90, 30d supply, fill #0

## 2022-03-30 NOTE — Telephone Encounter (Signed)
MEDICATION: ALPRAZolam (XANAX) 1 MG tablet   PHARMACY:[facility]   Comments: Patient said she is out of refills at the pharmacy. She had an appointment 03/16/22 to get medications refilled. Best callback number is [phone].  **Let patient know to contact pharmacy at the end of the day to make sure medication is ready. **  ** Please notify patient to allow 48-72 hours to process**  **Encourage patient to contact the pharmacy for refills or they can request refills through [facility]**

## 2022-03-30 NOTE — Telephone Encounter (Signed)
Done erx 

## 2022-04-03 ENCOUNTER — Ambulatory Visit: Payer: No Typology Code available for payment source | Admitting: Family Medicine

## 2022-04-14 ENCOUNTER — Other Ambulatory Visit (HOSPITAL_COMMUNITY): Payer: Self-pay

## 2022-04-14 ENCOUNTER — Other Ambulatory Visit: Payer: Self-pay | Admitting: Internal Medicine

## 2022-04-14 ENCOUNTER — Telehealth: Payer: Self-pay | Admitting: Internal Medicine

## 2022-04-14 DIAGNOSIS — F333 Major depressive disorder, recurrent, severe with psychotic symptoms: Secondary | ICD-10-CM

## 2022-04-14 DIAGNOSIS — E118 Type 2 diabetes mellitus with unspecified complications: Secondary | ICD-10-CM

## 2022-04-14 MED ORDER — BUPROPION HCL ER (XL) 150 MG PO TB24
150.0000 mg | ORAL_TABLET | Freq: Every day | ORAL | 1 refills | Status: DC
  Filled 2022-04-14: qty 90, 90d supply, fill #0

## 2022-04-14 MED ORDER — TIRZEPATIDE 5 MG/0.5ML ~~LOC~~ SOAJ
5.0000 mg | SUBCUTANEOUS | 0 refills | Status: DC
  Filled 2022-04-14: qty 2, 28d supply, fill #0
  Filled 2022-05-13: qty 2, 28d supply, fill #1
  Filled 2022-06-05: qty 2, 28d supply, fill #2

## 2022-04-14 NOTE — Telephone Encounter (Signed)
Prescription Request  04/14/2022  LOV: 03/16/2022  What is the name of the medication or equipment?   tirzepatide (MOUNJARO) 5 MG/0.5ML Pen   buPROPion (WELLBUTRIN XL) 150 MG 24 hr tablet   Have you contacted your pharmacy to request a refill? Yes   Which pharmacy would you like this sent to?  [place] [address] Phone: [phone] Fax: [phone]    Patient notified that their request is being sent to the clinical staff for review and that they should receive a response within 2 business days.   Please advise at Mobile [phone] (mobile)   PT would like to be notified when RX's have been sent to pharmacy. States she needs [facility] for her injection on Sunday.

## 2022-04-15 ENCOUNTER — Other Ambulatory Visit (HOSPITAL_COMMUNITY): Payer: Self-pay

## 2022-04-27 ENCOUNTER — Other Ambulatory Visit: Payer: Self-pay | Admitting: Internal Medicine

## 2022-04-27 ENCOUNTER — Telehealth: Payer: Self-pay | Admitting: Internal Medicine

## 2022-04-27 ENCOUNTER — Other Ambulatory Visit (HOSPITAL_COMMUNITY): Payer: Self-pay

## 2022-04-27 DIAGNOSIS — F411 Generalized anxiety disorder: Secondary | ICD-10-CM

## 2022-04-27 MED ORDER — ALPRAZOLAM 1 MG PO TABS
1.0000 mg | ORAL_TABLET | Freq: Three times a day (TID) | ORAL | 2 refills | Status: DC | PRN
  Filled 2022-04-27: qty 90, 30d supply, fill #0

## 2022-04-27 NOTE — Telephone Encounter (Signed)
Prescription Request  04/27/2022  LOV: 03/16/2022  What is the name of the medication or equipment?  ALPRAZolam Duanne Moron) 1 MG tablet   Have you contacted your pharmacy to request a refill? Yes   Which pharmacy would you like this sent to?  [place] [address] Phone: [phone] Fax: [phone]    Patient notified that their request is being sent to the clinical staff for review and that they should receive a response within 2 business days.   Please advise at Mobile [phone] (mobile)

## 2022-04-28 ENCOUNTER — Other Ambulatory Visit (HOSPITAL_COMMUNITY): Payer: Self-pay

## 2022-04-28 ENCOUNTER — Other Ambulatory Visit: Payer: Self-pay | Admitting: Internal Medicine

## 2022-04-28 DIAGNOSIS — F411 Generalized anxiety disorder: Secondary | ICD-10-CM

## 2022-04-28 MED ORDER — ALPRAZOLAM 1 MG PO TABS
1.0000 mg | ORAL_TABLET | Freq: Three times a day (TID) | ORAL | 2 refills | Status: DC | PRN
  Filled 2022-06-03 (×2): qty 90, 30d supply, fill #0

## 2022-05-13 ENCOUNTER — Other Ambulatory Visit (HOSPITAL_COMMUNITY): Payer: Self-pay

## 2022-05-27 ENCOUNTER — Other Ambulatory Visit (HOSPITAL_COMMUNITY): Payer: Self-pay

## 2022-06-03 ENCOUNTER — Other Ambulatory Visit: Payer: Self-pay | Admitting: Internal Medicine

## 2022-06-03 ENCOUNTER — Other Ambulatory Visit (HOSPITAL_COMMUNITY): Payer: Self-pay

## 2022-06-03 DIAGNOSIS — E876 Hypokalemia: Secondary | ICD-10-CM

## 2022-06-03 DIAGNOSIS — I10 Essential (primary) hypertension: Secondary | ICD-10-CM

## 2022-06-03 MED ORDER — POTASSIUM CHLORIDE CRYS ER 20 MEQ PO TBCR
20.0000 meq | EXTENDED_RELEASE_TABLET | Freq: Two times a day (BID) | ORAL | 0 refills | Status: DC
  Filled 2022-06-03: qty 180, 90d supply, fill #0

## 2022-06-05 ENCOUNTER — Other Ambulatory Visit (HOSPITAL_COMMUNITY): Payer: Self-pay

## 2022-06-05 ENCOUNTER — Other Ambulatory Visit: Payer: Self-pay | Admitting: Internal Medicine

## 2022-06-05 DIAGNOSIS — E118 Type 2 diabetes mellitus with unspecified complications: Secondary | ICD-10-CM

## 2022-06-06 ENCOUNTER — Other Ambulatory Visit (HOSPITAL_COMMUNITY): Payer: Self-pay

## 2022-06-06 ENCOUNTER — Other Ambulatory Visit: Payer: Self-pay | Admitting: Internal Medicine

## 2022-06-06 DIAGNOSIS — E118 Type 2 diabetes mellitus with unspecified complications: Secondary | ICD-10-CM

## 2022-06-06 MED ORDER — TIRZEPATIDE 7.5 MG/0.5ML ~~LOC~~ SOAJ
7.5000 mg | SUBCUTANEOUS | 0 refills | Status: DC
  Filled 2022-06-06 – 2022-06-12 (×2): qty 2, 28d supply, fill #0
  Filled 2022-07-10: qty 2, 28d supply, fill #1
  Filled 2022-07-27: qty 2, 28d supply, fill #2

## 2022-06-06 NOTE — Telephone Encounter (Signed)
Re'd msg  Patient wants to know if she should go up to 7.5 mg-please advise

## 2022-06-07 ENCOUNTER — Other Ambulatory Visit (HOSPITAL_COMMUNITY): Payer: Self-pay

## 2022-06-12 ENCOUNTER — Other Ambulatory Visit (HOSPITAL_COMMUNITY): Payer: Self-pay

## 2022-06-15 ENCOUNTER — Telehealth: Payer: Self-pay | Admitting: Internal Medicine

## 2022-06-15 NOTE — Telephone Encounter (Signed)
A representative from [facility] called and said the patient's medications are too expensive for her. He suggested that Dr. Yetta Barre request a tier level exception for medications. Best callback for them is [phone].  Patient said she is out of several medications and cannot pick them up due to price, so she would like for this to be done ASAP. Best callback for her is [phone].

## 2022-06-16 ENCOUNTER — Other Ambulatory Visit (HOSPITAL_COMMUNITY): Payer: Self-pay

## 2022-06-19 ENCOUNTER — Ambulatory Visit: Payer: Medicare HMO | Admitting: Internal Medicine

## 2022-06-19 ENCOUNTER — Other Ambulatory Visit (HOSPITAL_COMMUNITY): Payer: Self-pay

## 2022-06-19 ENCOUNTER — Encounter: Payer: Self-pay | Admitting: Internal Medicine

## 2022-06-19 ENCOUNTER — Telehealth: Payer: Self-pay | Admitting: Internal Medicine

## 2022-06-19 VITALS — BP 122/76 | HR 68 | Temp 98.1°F | Resp 16 | Ht 67.0 in | Wt 148.0 lb

## 2022-06-19 DIAGNOSIS — E1159 Type 2 diabetes mellitus with other circulatory complications: Secondary | ICD-10-CM | POA: Diagnosis not present

## 2022-06-19 DIAGNOSIS — J418 Mixed simple and mucopurulent chronic bronchitis: Secondary | ICD-10-CM

## 2022-06-19 DIAGNOSIS — I10 Essential (primary) hypertension: Secondary | ICD-10-CM

## 2022-06-19 DIAGNOSIS — E039 Hypothyroidism, unspecified: Secondary | ICD-10-CM | POA: Diagnosis not present

## 2022-06-19 DIAGNOSIS — Z7985 Long-term (current) use of injectable non-insulin antidiabetic drugs: Secondary | ICD-10-CM

## 2022-06-19 DIAGNOSIS — I152 Hypertension secondary to endocrine disorders: Secondary | ICD-10-CM | POA: Diagnosis not present

## 2022-06-19 DIAGNOSIS — F333 Major depressive disorder, recurrent, severe with psychotic symptoms: Secondary | ICD-10-CM | POA: Diagnosis not present

## 2022-06-19 DIAGNOSIS — E785 Hyperlipidemia, unspecified: Secondary | ICD-10-CM | POA: Diagnosis not present

## 2022-06-19 DIAGNOSIS — E118 Type 2 diabetes mellitus with unspecified complications: Secondary | ICD-10-CM | POA: Diagnosis not present

## 2022-06-19 DIAGNOSIS — F411 Generalized anxiety disorder: Secondary | ICD-10-CM | POA: Diagnosis not present

## 2022-06-19 DIAGNOSIS — Z23 Encounter for immunization: Secondary | ICD-10-CM

## 2022-06-19 LAB — HEMOGLOBIN A1C: Hgb A1c MFr Bld: 6.1 % (ref 4.6–6.5)

## 2022-06-19 LAB — TSH: TSH: 0.01 u[IU]/mL — ABNORMAL LOW (ref 0.35–5.50)

## 2022-06-19 LAB — BASIC METABOLIC PANEL
BUN: 11 mg/dL (ref 6–23)
CO2: 26 mEq/L (ref 19–32)
Calcium: 9.4 mg/dL (ref 8.4–10.5)
Chloride: 106 mEq/L (ref 96–112)
Creatinine, Ser: 0.82 mg/dL (ref 0.40–1.20)
GFR: 76.23 mL/min (ref >60.00)
Glucose, Bld: 105 mg/dL — ABNORMAL HIGH (ref 70–99)
Potassium: 4.5 mEq/L (ref 3.5–5.1)
Sodium: 141 mEq/L (ref 135–145)

## 2022-06-19 LAB — LIPID PANEL
Cholesterol: 166 mg/dL (ref 0–200)
HDL: 37.7 mg/dL — ABNORMAL LOW (ref >39.00)
LDL Cholesterol: 109 mg/dL — ABNORMAL HIGH (ref 0–99)
NonHDL: 127.99
Total CHOL/HDL Ratio: 4
Triglycerides: 97 mg/dL (ref 0.0–149.0)
VLDL: 19.4 mg/dL (ref 0.0–40.0)

## 2022-06-19 MED ORDER — UNITHROID 112 MCG PO TABS
112.0000 ug | ORAL_TABLET | Freq: Every day | ORAL | 0 refills | Status: DC
Start: 2022-06-19 — End: 2022-06-23
  Filled 2022-06-19: qty 90, 90d supply, fill #0

## 2022-06-19 MED ORDER — TRELEGY ELLIPTA 100-62.5-25 MCG/ACT IN AEPB
1.0000 | INHALATION_SPRAY | Freq: Every day | RESPIRATORY_TRACT | 1 refills | Status: DC
Start: 2022-06-19 — End: 2022-09-15
  Filled 2022-06-19: qty 120, 60d supply, fill #0

## 2022-06-19 NOTE — Patient Instructions (Signed)

## 2022-06-19 NOTE — Telephone Encounter (Signed)
Patient wants to know if she has been referred for a colonoscopy yet?  Please advise.  Phone:  [phone]

## 2022-06-19 NOTE — Progress Notes (Unsigned)
Subjective:  Patient ID: Tasha [name], female    DOB: July 21, 1960  Age: 62 y.o. MRN: [id number]  CC: Hypertension, Diabetes, Hyperlipidemia, and Hypothyroidism   HPI [name] presents for f/up ---    She has intentionally lost weight.  She is active and denies chest pain, diaphoresis, or edema.  Outpatient Medications Prior to Visit  Medication Sig Dispense Refill   albuterol (VENTOLIN HFA) 108 (90 Base) MCG/ACT inhaler Inhale 2 puffs into the lungs every 6 (six) hours as needed for wheezing or shortness of breath. 6.7 g 2   ALPRAZolam (XANAX) 1 MG tablet Take 1 tablet (1 mg total) by mouth 3 (three) times daily as needed for anxiety. 90 tablet 2   buPROPion (WELLBUTRIN XL) 150 MG 24 hr tablet Take 1 tablet (150 mg total) by mouth daily. 90 tablet 1   celecoxib (CELEBREX) 100 MG capsule      ezetimibe (ZETIA) 10 MG tablet Take 1 tablet (10 mg total) by mouth daily. 90 tablet 1   olmesartan (BENICAR) 20 MG tablet Take 1 tablet (20 mg total) by mouth daily. (Patient not taking: Reported on 06/21/2022) 90 tablet 0   omega-3 acid ethyl esters (LOVAZA) 1 g capsule Take 2 capsules by mouth 2 (two) times daily. (Patient not taking: Reported on 06/21/2022)     potassium chloride SA (KLOR-CON M) 20 MEQ tablet Take 1 tablet (20 mEq) by mouth 2 times daily. 180 tablet 0   tirzepatide (MOUNJARO) 7.5 MG/0.5ML Pen Inject 7.5 mg into the skin once a week. 6 mL 0   albuterol (VENTOLIN HFA) 108 (90 Base) MCG/ACT inhaler Inhale 2 puffs into the lungs every 6 (six) hours as needed for Wheezing. 6.7 g 0   Fluticasone-Umeclidin-Vilant (TRELEGY ELLIPTA) 100-62.5-25 MCG/ACT AEPB Inhale 1 puff into the lungs daily. 120 each 1   Fremanezumab-vfrm (AJOVY) 225 MG/1.5ML SOAJ Inject 225 mg into the skin every 30 (thirty) days. (Patient not taking: Reported on 06/21/2022) 1.68 mL 11   levothyroxine (SYNTHROID) 150 MCG tablet Take 1 tablet (150 mcg total) by mouth daily. 90 tablet 0   methocarbamol (ROBAXIN)  500 MG tablet methocarbamol 500 mg tablet  TAKE 1 TABLET BY MOUTH 4 TIMES A DAY (Patient not taking: Reported on 06/21/2022)     mometasone (ELOCON) 0.1 % cream Apply 1 Application topically daily. 15 g 0   vortioxetine HBr (TRINTELLIX) 5 MG TABS tablet Take 1 tablet (5 mg total) by mouth daily. 90 tablet 0   No facility-administered medications prior to visit.    ROS Review of Systems  Constitutional:  Negative for appetite change, diaphoresis, fatigue and unexpected weight change.  HENT: Negative.    Eyes:  Negative for visual disturbance.  Respiratory:  Positive for shortness of breath. Negative for cough, chest tightness and wheezing.   Cardiovascular:  Negative for chest pain, palpitations and leg swelling.  Gastrointestinal:  Negative for abdominal pain, constipation, diarrhea, nausea and vomiting.  Endocrine: Positive for cold intolerance. Negative for heat intolerance.  Genitourinary: Negative.  Negative for difficulty urinating.  Musculoskeletal: Negative.  Negative for arthralgias.  Skin: Negative.   Neurological:  Positive for dizziness. Negative for weakness and light-headedness.  Hematological:  Negative for adenopathy. Does not bruise/bleed easily.  Psychiatric/Behavioral:  Positive for dysphoric mood. Negative for decreased concentration, sleep disturbance and suicidal ideas. The patient is nervous/anxious.     Objective:  BP 122/76 (BP Location: Left Arm, Patient Position: Sitting, Cuff Size: Large)   Pulse 68   Temp 98.1  F (36.7 C) (Oral)   Resp 16   Ht 5\' 7"  (1.702 m)   Wt 148 lb (67.1 kg)   SpO2 95%   BMI 23.18 kg/m   BP Readings from Last 3 Encounters:  06/19/22 122/76  03/16/22 (!) 144/88  01/13/22 118/70    Wt Readings from Last 3 Encounters:  06/19/22 148 lb (67.1 kg)  03/16/22 175 lb (79.4 kg)  01/13/22 174 lb 3.2 oz (79 kg)    Physical Exam Vitals reviewed.  Constitutional:      Appearance: She is not ill-appearing.  HENT:     Nose: Nose  normal.     Mouth/Throat:     Mouth: Mucous membranes are moist.  Eyes:     General: No scleral icterus.    Conjunctiva/sclera: Conjunctivae normal.  Cardiovascular:     Rate and Rhythm: Normal rate and regular rhythm.     Heart sounds: No murmur heard. Pulmonary:     Effort: Pulmonary effort is normal.     Breath sounds: No stridor. No wheezing, rhonchi or rales.  Abdominal:     General: Abdomen is flat.     Palpations: There is no mass.     Tenderness: There is no abdominal tenderness. There is no guarding.     Hernia: No hernia is present.  Musculoskeletal:        General: Normal range of motion.     Cervical back: Neck supple.     Right lower leg: No edema.     Left lower leg: No edema.  Lymphadenopathy:     Cervical: No cervical adenopathy.  Skin:    General: Skin is warm and dry.  Neurological:     General: No focal deficit present.     Mental Status: She is alert. Mental status is at baseline.  Psychiatric:        Mood and Affect: Mood normal.        Behavior: Behavior normal.     Lab Results  Component Value Date   WBC 6.3 03/16/2022   HGB 14.9 03/16/2022   HCT 44.1 03/16/2022   PLT 157.0 03/16/2022   GLUCOSE 105 (H) 06/19/2022   CHOL 166 06/19/2022   TRIG 97.0 06/19/2022   HDL 37.70 (L) 06/19/2022   LDLDIRECT 140.0 02/14/2021   LDLCALC 109 (H) 06/19/2022   ALT 28 10/15/2021   AST 38 10/15/2021   NA 141 06/19/2022   K 4.5 06/19/2022   CL 106 06/19/2022   CREATININE 0.82 06/19/2022   BUN 11 06/19/2022   CO2 26 06/19/2022   TSH 0.01 Repeated and verified X2. (L) 06/19/2022   INR 1.0 01/27/2019   HGBA1C 6.1 06/19/2022   MICROALBUR 0.9 10/11/2021    MM 3D SCREEN BREAST BILATERAL  Result Date: 03/23/2022 CLINICAL DATA:  Screening. EXAM: DIGITAL SCREENING BILATERAL MAMMOGRAM WITH TOMOSYNTHESIS AND CAD TECHNIQUE: Bilateral screening digital craniocaudal and mediolateral oblique mammograms were obtained. Bilateral screening digital breast tomosynthesis  was performed. The images were evaluated with computer-aided detection. COMPARISON:  Previous exam(s). ACR Breast Density Category b: There are scattered areas of fibroglandular density. FINDINGS: There are no findings suspicious for malignancy. IMPRESSION: No mammographic evidence of malignancy. A result letter of this screening mammogram will be mailed directly to the patient. RECOMMENDATION: Screening mammogram in one year. (Code:SM-B-01Y) BI-RADS CATEGORY  1: Negative. Electronically Signed   By: Sherron Ales M.D.   On: 03/23/2022 11:58    Assessment & Plan:   Hyperlipidemia with target LDL less than 130- She  is not willing to take a statin. -     Lipid panel; Future -     TSH; Future  Primary hypertension- Her blood pressure is well-controlled. -     Basic metabolic panel; Future  Hypertension associated with diabetes (HCC) -     Basic metabolic panel; Future  Acquired hypothyroidism- Her TSH is suppressed.  Will lower her T4 dosage. -     TSH; Future -     Unithroid; Take 1 tablet (112 mcg total) by mouth daily before breakfast. (Patient not taking: Reported on 06/21/2022)  Dispense: 90 tablet; Refill: 0  Type II diabetes mellitus with manifestations (HCC)- Her blood sugar is very well-controlled. -     Basic metabolic panel; Future -     Hemoglobin A1c; Future  Mixed simple and mucopurulent chronic bronchitis (HCC) -     Trelegy Ellipta; Inhale 1 puff into the lungs daily. (Patient not taking: Reported on 06/21/2022)  Dispense: 120 each; Refill: 1  Need for prophylactic vaccination with combined diphtheria-tetanus-pertussis (DTP) vaccine -     Boostrix; Inject 0.5 mLs into the muscle once for 1 dose.  Dispense: 0.5 mL; Refill: 0  GAD (generalized anxiety disorder) -     Sertraline HCl; Take 1 tablet (100 mg total) by mouth daily.  Dispense: 90 tablet; Refill: 1  Severe episode of recurrent major depressive disorder, with psychotic features (HCC) -     Sertraline HCl; Take 1  tablet (100 mg total) by mouth daily.  Dispense: 90 tablet; Refill: 1     Follow-up: Return in about 6 months (around 12/20/2022).  Sanda Linger, MD

## 2022-06-20 ENCOUNTER — Telehealth: Payer: Self-pay

## 2022-06-20 ENCOUNTER — Other Ambulatory Visit (HOSPITAL_COMMUNITY): Payer: Self-pay

## 2022-06-20 MED ORDER — BOOSTRIX 5-2.5-18.5 LF-MCG/0.5 IM SUSP
0.5000 mL | Freq: Once | INTRAMUSCULAR | 0 refills | Status: AC
Start: 2022-06-20 — End: 2022-06-21
  Filled 2022-06-20: qty 0.5, 1d supply, fill #0

## 2022-06-20 NOTE — Progress Notes (Signed)
   06/20/2022  Patient ID: Tasha [name], female   DOB: [birth year], 62 y.o.   MRN: [id number]  Patient outreach to schedule telephone visit after request was received from PCP, Dr. Sanda Linger, to assist with affordability of medications.  Scheduled for Wednesday 5/15 at 9am.  Lenna Gilford, PharmD, DPLA

## 2022-06-20 NOTE — Telephone Encounter (Signed)
She is due this month- I have not yet reviewed May list but we will arrange a colonoscopy  Tasha [name] - please set up previsit and colonoscopy   Dx hx colon polyps

## 2022-06-21 ENCOUNTER — Other Ambulatory Visit (HOSPITAL_COMMUNITY): Payer: Self-pay

## 2022-06-21 ENCOUNTER — Telehealth: Payer: Self-pay | Admitting: Internal Medicine

## 2022-06-21 ENCOUNTER — Telehealth: Payer: Self-pay

## 2022-06-21 ENCOUNTER — Other Ambulatory Visit: Payer: Medicare HMO

## 2022-06-21 DIAGNOSIS — I2510 Atherosclerotic heart disease of native coronary artery without angina pectoris: Secondary | ICD-10-CM

## 2022-06-21 DIAGNOSIS — E785 Hyperlipidemia, unspecified: Secondary | ICD-10-CM

## 2022-06-21 MED ORDER — SERTRALINE HCL 100 MG PO TABS
100.0000 mg | ORAL_TABLET | Freq: Every day | ORAL | 1 refills | Status: DC
Start: 2022-06-21 — End: 2022-12-21
  Filled 2022-06-21: qty 90, 90d supply, fill #0
  Filled 2022-09-21: qty 90, 90d supply, fill #1

## 2022-06-21 NOTE — Telephone Encounter (Signed)
Received fax from [facility] that it was not on formulary. The preferred drugs are: Emgality & qulipta.  I do not see qulipta tried or emgality have been tried.   Routing to provider to see if either of those can be option

## 2022-06-21 NOTE — Progress Notes (Signed)
06/21/2022 Name: Tasha [name] MRN: [id number] DOB: 01-27-1961  Chief Complaint  Patient presents with   Medication Adherence   Tasha [name] is a 62 y.o. year old female who presented for a telephone visit.   They were referred to the pharmacist by their PCP for assistance in managing medication access.   Patient is participating in a Managed Medicaid Plan:  No  Subjective: Telephone visit conducted after PCP, Dr. Yetta Barre, requested assistance with affordability of medications, some that patient is currently out of due to cost. Care Team: Primary Care Provider: Etta Grandchild, MD ; Next Scheduled Visit: 09/18/22  Medication Access/Adherence Current Pharmacy:  [facility] [address] [place] [address] Phone: [phone] Fax: [phone]  Patient reports affordability concerns with their medications: Yes  Patient reports access/transportation concerns to their pharmacy: No  Patient reports adherence concerns with their medications:  Yes  Currently out of Trelegy  Medication Management: -Patient reports Fair adherence to medications -Patient reports the following barriers to adherence: Cost -Reviewed medication list with patient and identified the following: 1.  Sertaline not listed, but patient is taking 100mg  daily in addition to bupropion 150mg  daily since Trintellex was not affordable.  Endorses tolerating this regimen well. 2.  Has not been taking pravastatin 10mg  daily but wishes to resume for ASCVD risk reduction 3.  States she took 1 tablet of olmesartan 20mg  daily but stopped b/c her blood pressure was too low.  Unable to provide reading. 4.  Has 2 pens of Mounjaro 7.5mg  remaining with next dose due Sunday; patient will not be able to afford refills moving forward 5.  Has not been taking Trelegy due to cost 6.  Has albuterol rescue inhaler at home but endorses refill for it was also costly 7.  Dr. Yetta Barre recently decrease  thryoid medication dose, and script was sent in and processed as brand Unithroid which she cannot afford.  Objective:  Lab Results  Component Value Date   HGBA1C 6.1 06/19/2022    Lab Results  Component Value Date   CREATININE 0.82 06/19/2022   BUN 11 06/19/2022   NA 141 06/19/2022   K 4.5 06/19/2022   CL 106 06/19/2022   CO2 26 06/19/2022    Lab Results  Component Value Date   CHOL 166 06/19/2022   HDL 37.70 (L) 06/19/2022   LDLCALC 109 (H) 06/19/2022   LDLDIRECT 140.0 02/14/2021   TRIG 97.0 06/19/2022   CHOLHDL 4 06/19/2022    Medications Reviewed Today     Reviewed by Lenna Gilford, RPH (Pharmacist) on 06/21/22 at 0931  Med List Status: <None>   Medication Order Taking? Sig Documenting Provider Last Dose Status Informant  albuterol (VENTOLIN HFA) 108 (90 Base) MCG/ACT inhaler [id number] Yes Inhale 2 puffs into the lungs every 6 (six) hours as needed for wheezing or shortness of breath. Etta Grandchild, MD Taking Active   ALPRAZolam Prudy Feeler) 1 MG tablet [id number] Yes Take 1 tablet (1 mg total) by mouth 3 (three) times daily as needed for anxiety. Etta Grandchild, MD Taking Active     Discontinued 02/27/20 1957   buPROPion (WELLBUTRIN XL) 150 MG 24 hr tablet [id number] Yes Take 1 tablet (150 mg total) by mouth daily. Etta Grandchild, MD Taking Active   celecoxib (CELEBREX) 100 MG capsule [id number] Yes  [provider] Taking Active            Med Note (Samrat Hayward A   Wed  Jun 21, 2022  9:05 AM) As needed for leg pain  Discontinued 02/27/20 1957   ezetimibe (ZETIA) 10 MG tablet [id number] Yes Take 1 tablet (10 mg total) by mouth daily. Etta Grandchild, MD Taking Active   Fluticasone-Umeclidin-Vilant [facility] ELLIPTA) 100-62.5-25 MCG/ACT AEPB [id number] No Inhale 1 puff into the lungs daily.  Patient not taking: Reported on 06/21/2022   Etta Grandchild, MD Not Taking Active            Med Note Littie Deeds, [place] A   Wed Jun 21, 2022  9:07 AM) Cost  olmesartan  (BENICAR) 20 MG tablet [id number] No Take 1 tablet (20 mg total) by mouth daily.  Patient not taking: Reported on 06/21/2022   Etta Grandchild, MD Not Taking Active   omega-3 acid ethyl esters (LOVAZA) 1 g capsule [id number] No Take 2 capsules by mouth 2 (two) times daily.  Patient not taking: Reported on 06/21/2022   [provider] Not Taking Active            Med Note Littie Deeds, Mina Babula A   Wed Jun 21, 2022  9:12 AM) Cost  potassium chloride SA (KLOR-CON M) 20 MEQ tablet [id number] Yes Take 1 tablet (20 mEq) by mouth 2 times daily. Etta Grandchild, MD Taking Active      Discontinued 03/20/22 [phone] (External Source Cancellation)   sertraline (ZOLOFT) 100 MG tablet [id number] Yes Take 100 mg by mouth daily. [provider] Taking Active   Tdap (BOOSTRIX) 5-2.5-18.5 LF-MCG/0.5 injection [id number]  Inject 0.5 mLs into the muscle once for 1 dose. Etta Grandchild, MD  Active   tirzepatide [facility]) 7.5 MG/0.5ML Pen [id number] Yes Inject 7.5 mg into the skin once a week. Etta Grandchild, MD Taking Active   UNITHROID 112 MCG tablet [id number] No Take 1 tablet (112 mcg total) by mouth daily before breakfast.  Patient not taking: Reported on 06/21/2022   Etta Grandchild, MD Not Taking Active            Assessment/Plan:   Medication Management: - Currently strategy insufficient to maintain appropriate adherence to prescribed medication regimen - Prescription for sertraline 100mg  refill sent in by Dr. Yetta Barre yesterday - Pending pravastatin 10mg  daily for Dr. Yetta Barre to sign if in agreement - Discussed role of ARB therapy in HTN as well as DM for renal protection, and stated we could always decrease dose by half.  Recommend taking one-half tablet of olmesartan 20mg  and monitoring/recording BP daily. - Discussed [employer] for assistance with Mounjaro since there is no PAP program for this.  Applied on her behalf, and she was approved $1,000 grant to assist with costs related to the  medication. - Based on household income, patient will qualify for PAP through [employer] for Trelegy.  Will work with medication assistance team to initiate application.  Will see if Dr. Barnett Applebaum office has a sample she can get in the mean time. - Consulting Dr. Yetta Barre regarding Unithroid; likely this was not intended to be filled as brand.  Will request that he send prescription for levothyroxine if not. - Contacting pharmacy to see if albuterol inhaler can be substituted for different generic to reduce cost.  Follow Up Plan: Will contact patient to follow-up and inform.  Will also schedule follow-up for home BP monitoring.  Lenna Gilford, PharmD, DPLA

## 2022-06-21 NOTE — Telephone Encounter (Signed)
She needs a revisit. Last seen in August,planned to follow up in 4-6 months. I will switch to [employer] Stop Ajovy, I don't see Ajovy on her list. Can you confirm she was taking? If not will do loading dose of Emgality. Thanks

## 2022-06-21 NOTE — Telephone Encounter (Signed)
Tried calling pt. LVM to call back.

## 2022-06-21 NOTE — Telephone Encounter (Signed)
Patient would like to speak with Dr. Yetta Barre nurse concerning her blood pressure.  Phone:  [phone]

## 2022-06-21 NOTE — Telephone Encounter (Signed)
Pharmacy Patient Advocate Encounter   Received notification from [facility] that prior authorization for Ajovy is required/requested.   PA submitted on 06/21/2022 to (ins) [employer] via Fax [phone] [facility] or [facility]) confirmation # EOC ID# [id number] Status is pending

## 2022-06-22 ENCOUNTER — Other Ambulatory Visit (HOSPITAL_COMMUNITY): Payer: Self-pay

## 2022-06-22 ENCOUNTER — Telehealth: Payer: Self-pay

## 2022-06-22 ENCOUNTER — Encounter: Payer: Self-pay | Admitting: Internal Medicine

## 2022-06-22 NOTE — Telephone Encounter (Signed)
Tasha [name] has been set up pre-visit and colonoscopy appointments. Paperwork will be mailed out to her.

## 2022-06-22 NOTE — Telephone Encounter (Signed)
Pharmacy Patient Advocate Encounter   Received notification from [facility] that tier exception for Mounjaro 7.5mg /0.41ml is required/requested.   Tier exception submitted on 06/22/22 to (ins) [employer] via fax at [phone]  Status is pending  Per test claim, there was a reject for a refill too soon for the [facility]. Tried a test claim for Trulicity and the copay came back as $229.77 due to the patient being in the coverage gap.

## 2022-06-23 ENCOUNTER — Other Ambulatory Visit: Payer: Self-pay | Admitting: Internal Medicine

## 2022-06-23 ENCOUNTER — Telehealth: Payer: Self-pay

## 2022-06-23 ENCOUNTER — Other Ambulatory Visit (HOSPITAL_COMMUNITY): Payer: Self-pay

## 2022-06-23 DIAGNOSIS — I2510 Atherosclerotic heart disease of native coronary artery without angina pectoris: Secondary | ICD-10-CM

## 2022-06-23 DIAGNOSIS — E039 Hypothyroidism, unspecified: Secondary | ICD-10-CM

## 2022-06-23 DIAGNOSIS — E118 Type 2 diabetes mellitus with unspecified complications: Secondary | ICD-10-CM

## 2022-06-23 DIAGNOSIS — I10 Essential (primary) hypertension: Secondary | ICD-10-CM

## 2022-06-23 MED ORDER — PRAVASTATIN SODIUM 20 MG PO TABS
20.0000 mg | ORAL_TABLET | Freq: Every day | ORAL | 0 refills | Status: DC
Start: 2022-06-23 — End: 2022-09-21
  Filled 2022-06-23: qty 90, 90d supply, fill #0

## 2022-06-23 MED ORDER — LEVOTHYROXINE SODIUM 112 MCG PO TABS
112.0000 ug | ORAL_TABLET | Freq: Every day | ORAL | 0 refills | Status: DC
Start: 2022-06-23 — End: 2022-09-21
  Filled 2022-06-23 (×2): qty 90, 90d supply, fill #0

## 2022-06-23 NOTE — Telephone Encounter (Addendum)
Pharmacy Patient Advocate Encounter  Received notification from [facility] that the request for tier exception for Mounjaro 7.5mg /0.38ml has been denied due to tiering exceptions are allowed if your provider tells [place] that all the lower cost level drugs (lower tier drugs) available to treat your condition would not work as well or would cause bad side effects. The lower tier drugs are: Invokana, Januvia, Tradjenta.     Per test claims for [facility]:  Invokana 100mg  30 tabs - $140.74 Januvi100mg  30 tabs - $134.74 Tradjent5mg  30 tabs - $123.47  Patient is also in the donut hole (coverage gap) which will make her copays more expensive.   Denial letter attached to chart

## 2022-06-23 NOTE — Progress Notes (Unsigned)
   06/23/2022  Patient ID: Tasha [name], female   DOB: 1960-12-18, 62 y.o.   MRN: [id number]  Subjective/Objective: Telephone visit to follow up on needs identified during previous visit this week  Medication Managememt -Informed patient that the following prescriptions have been sent to [facility]:  sertraline 100mg  daily, levothyroxine daily, and pravastatin 20mg  daily -Endorses taking levothyroxine she has at home and questions necessity to get dose at this time, as she is "feeling well" -Patient endorses taking full tablet of olmesartan 20mg  one day this week and had a BP reading of approximately 80/40 with dizziness and lightheadedness- has not taken another dose -Informed her of [employer] grant approval and that information has been provided to pharmacy to apply to copay of Mounjaro 7.5mg .  This will take her copay down to $0 until grant total of $1000 has been depleted.  We can then reapply if fund is open. -Let her know we are working on Trelegy PAP application; she found Trelegy sample at home with 20 days remaining -Patient states she only checks her BG at home if she does not feel well, like she is hypoglycemic- states this is infrequent  Assessment/Plan:  Medication Management -Encouraged patient to pick up levothyroxine based on recent TSH level- she plans to pick up along with other medications -Recommend discontinuing olmesartan 20mg , but discussed addition of less potent medication that would provide renal protection.  Patient in agreement with plan.  Pending prescription for lisinopril 5mg  daily for Dr. Yetta Barre to sign if in agreement -Recommend patient start checking BP daily and record -Also plans to begin checking BG regularly and recording  Follow-up:  Telephone visit to check on home BP and BG in two weeks  Lenna Gilford, PharmD, DPLA

## 2022-06-24 ENCOUNTER — Other Ambulatory Visit: Payer: Self-pay | Admitting: Internal Medicine

## 2022-06-24 ENCOUNTER — Other Ambulatory Visit (HOSPITAL_COMMUNITY): Payer: Self-pay

## 2022-06-24 MED ORDER — LISINOPRIL 5 MG PO TABS
5.0000 mg | ORAL_TABLET | Freq: Every day | ORAL | 0 refills | Status: DC
  Filled 2022-06-24 – 2022-08-07 (×3): qty 90, 90d supply, fill #0

## 2022-06-28 ENCOUNTER — Other Ambulatory Visit (HOSPITAL_COMMUNITY): Payer: Self-pay

## 2022-06-28 ENCOUNTER — Telehealth: Payer: Self-pay

## 2022-06-28 NOTE — Telephone Encounter (Signed)
Mailed [employer] application to patient home for assistance for Trelegy.

## 2022-06-28 NOTE — Telephone Encounter (Signed)
-----   Message from Lenna Gilford, [facility] sent at 06/26/2022 11:02 AM EDT ----- Thank you so much! ----- Message ----- From: Otho Najjar, CPhT Sent: 06/26/2022  10:58 AM EDT To: Lenna Gilford, [facility]  Sure!  I will get that application mailed to patients home. With a reminder that being part D, she will have to have spent $600 on prescriptions for current calendar year (will need printout from pharmacy, which I will inform patient). ----- Message ----- From: Lenna Gilford, [facility] Sent: 06/22/2022   6:32 PM EDT To: Rx Med Assistance Team  Good evening,  Would you all mind to initiate the PAP application process with [employer] for Trelegy for Ms. Symmonds please?  Thank you!

## 2022-07-06 ENCOUNTER — Other Ambulatory Visit (HOSPITAL_COMMUNITY): Payer: Self-pay

## 2022-07-07 ENCOUNTER — Other Ambulatory Visit: Payer: Medicare HMO

## 2022-07-07 ENCOUNTER — Other Ambulatory Visit (HOSPITAL_COMMUNITY): Payer: Self-pay

## 2022-07-07 NOTE — Progress Notes (Signed)
   07/07/2022  Patient ID: Tasha [name], female   DOB: July 18, 1960, 62 y.o.   MRN: [id number]  Subjective/Objective:  Medication Access/Adherence -Trelegy PAP application mailed to patient 5/22- she received in the mail today and will complete and send back in -Only has 7 day supply of Trelegy at home -Picked up prescription levothyroxine daily   HTN -Stopped olmesartan and started lisinopril 5mg  approximately a week ago; and recent home BP has been 108/60, 113/63  -Endorses feeling better and has only been dizzy once or twice versus consistently when on the olmesartan  DM -FBG 70-100 -Taking Mounjaro 7.5mg  weekly and continues to tolerate well  Assessment/Plan:  Medication Access/Adherence -Messaging PCP office to see if they have samples of Trelegy or another long-acting inhaler patient can pick up while working on obtaining PAP  HTN -Under better control with lisinopril 5mg  but instructed patient to reach out if home BP consistently <100/60  DM -Controlled- continue currently regimen  Follow-up:  Sees Dr. Yetta Barre in August, and I have scheduled out for 3 months after that.  Patient has direct number if there are any medication needs I can assist with.  Lenna Gilford, PharmD, DPLA

## 2022-07-10 ENCOUNTER — Other Ambulatory Visit (HOSPITAL_COMMUNITY): Payer: Self-pay

## 2022-07-14 ENCOUNTER — Other Ambulatory Visit: Payer: No Typology Code available for payment source

## 2022-07-27 ENCOUNTER — Telehealth: Payer: Self-pay

## 2022-07-27 ENCOUNTER — Telehealth: Payer: Self-pay | Admitting: *Deleted

## 2022-07-27 ENCOUNTER — Other Ambulatory Visit (HOSPITAL_COMMUNITY): Payer: Self-pay

## 2022-07-27 NOTE — Telephone Encounter (Signed)
Called back to try and reach patient.  Left message to call us back by 5pm today to reschedule pre visit with the nurse or the colonoscopy would be cancelled.

## 2022-07-27 NOTE — Telephone Encounter (Signed)
Called patient for 11am pre visit appointment. Left message that I had called for the pre visit appointment and would try back in 5 min

## 2022-07-27 NOTE — Telephone Encounter (Signed)
Pt a NOS to PV.  Colonoscopy for 08-17-22 cancelled and NOS letter sent

## 2022-07-31 ENCOUNTER — Other Ambulatory Visit (HOSPITAL_COMMUNITY): Payer: Self-pay

## 2022-08-07 ENCOUNTER — Other Ambulatory Visit (HOSPITAL_COMMUNITY): Payer: Self-pay

## 2022-08-07 ENCOUNTER — Telehealth: Payer: Self-pay

## 2022-08-07 NOTE — Progress Notes (Signed)
   08/07/2022  Patient ID: Tasha [name], female   DOB: [birth year], 62 y.o.   MRN: [id number]  Patient outreach to return missed call/voicemail from Ms. Cotterill regarding medication billing issue at the pharmacy.  I was not able to reach her, but I left a HIPAA compliant voicemail for her to return my call this evening or tomorrow morning.  Lenna Gilford, PharmD, DPLA

## 2022-08-15 ENCOUNTER — Other Ambulatory Visit (HOSPITAL_COMMUNITY): Payer: Self-pay

## 2022-08-15 NOTE — Telephone Encounter (Signed)
Mailed to patients home again. Per patients MyChart request.   2nd attempt.

## 2022-08-17 ENCOUNTER — Encounter: Payer: Medicare HMO | Admitting: Internal Medicine

## 2022-08-23 ENCOUNTER — Telehealth: Payer: Self-pay | Admitting: Internal Medicine

## 2022-08-23 NOTE — Telephone Encounter (Signed)
Pt called asking about what she suppose to be getting done for her arteries procedure. Honestly I am very confuse what she was asking can someone call pt and get a better understanding what she is wanting. Thanks

## 2022-08-25 ENCOUNTER — Other Ambulatory Visit (HOSPITAL_COMMUNITY): Payer: Self-pay

## 2022-08-25 MED ORDER — POTASSIUM CHLORIDE CRYS ER 20 MEQ PO TBCR
20.0000 meq | EXTENDED_RELEASE_TABLET | Freq: Two times a day (BID) | ORAL | 0 refills | Status: DC
  Filled 2022-09-02: qty 180, 90d supply, fill #0

## 2022-08-25 MED ORDER — ALBUTEROL SULFATE HFA 108 (90 BASE) MCG/ACT IN AERS: 2.0000 | INHALATION_SPRAY | Freq: Four times a day (QID) | RESPIRATORY_TRACT | 3 refills | Status: DC | PRN

## 2022-08-25 MED ORDER — TRELEGY ELLIPTA 100-62.5-25 MCG/ACT IN AEPB
1.0000 | INHALATION_SPRAY | Freq: Every day | RESPIRATORY_TRACT | 2 refills | Status: DC
  Filled 2022-09-02: qty 60, 30d supply, fill #0

## 2022-08-25 MED ORDER — EZETIMIBE 10 MG PO TABS
10.0000 mg | ORAL_TABLET | Freq: Every day | ORAL | 0 refills | Status: DC
  Filled 2022-09-02: qty 90, 90d supply, fill #0

## 2022-08-25 MED ORDER — CLINDAMYCIN HCL 300 MG PO CAPS: 300.0000 mg | ORAL_CAPSULE | Freq: Three times a day (TID) | ORAL | 0 refills | Status: AC

## 2022-08-30 ENCOUNTER — Other Ambulatory Visit: Payer: Self-pay | Admitting: Internal Medicine

## 2022-08-30 ENCOUNTER — Other Ambulatory Visit (HOSPITAL_COMMUNITY): Payer: Self-pay

## 2022-08-30 ENCOUNTER — Telehealth: Payer: Self-pay | Admitting: Internal Medicine

## 2022-08-30 DIAGNOSIS — E118 Type 2 diabetes mellitus with unspecified complications: Secondary | ICD-10-CM

## 2022-08-30 MED ORDER — TIRZEPATIDE 7.5 MG/0.5ML ~~LOC~~ SOAJ
7.5000 mg | SUBCUTANEOUS | 0 refills | Status: DC
Start: 2022-08-30 — End: 2022-12-06
  Filled 2022-08-30: qty 6, 84d supply, fill #0

## 2022-08-30 NOTE — Telephone Encounter (Signed)
Next OV is 09/18/2022.   Prescription Request  08/30/2022  LOV: 06/19/2022  What is the name of the medication or equipment? tirzepatide [facility]) 7.5 MG/0.5ML Pen   Have you contacted your pharmacy to request a refill? No   Which pharmacy would you like this sent to?   - [facility] [address] [place] [address] Phone: [phone] Fax: [phone]    Patient notified that their request is being sent to the clinical staff for review and that they should receive a response within 2 business days.   Please advise at Mobile [phone] (mobile)

## 2022-09-02 ENCOUNTER — Other Ambulatory Visit (HOSPITAL_COMMUNITY): Payer: Self-pay

## 2022-09-13 ENCOUNTER — Other Ambulatory Visit: Payer: Self-pay | Admitting: Internal Medicine

## 2022-09-13 DIAGNOSIS — F411 Generalized anxiety disorder: Secondary | ICD-10-CM

## 2022-09-15 ENCOUNTER — Other Ambulatory Visit (HOSPITAL_COMMUNITY): Payer: Self-pay

## 2022-09-15 ENCOUNTER — Telehealth: Payer: Self-pay | Admitting: Internal Medicine

## 2022-09-15 ENCOUNTER — Other Ambulatory Visit: Payer: Self-pay | Admitting: Internal Medicine

## 2022-09-15 DIAGNOSIS — F411 Generalized anxiety disorder: Secondary | ICD-10-CM

## 2022-09-15 MED ORDER — ALPRAZOLAM 1 MG PO TABS
1.0000 mg | ORAL_TABLET | Freq: Three times a day (TID) | ORAL | 1 refills | Status: DC | PRN
  Filled 2022-09-15: qty 90, 30d supply, fill #0
  Filled 2022-10-23: qty 90, 30d supply, fill #1

## 2022-09-15 NOTE — Telephone Encounter (Signed)
Patient called and said she requested a refill for her ALPRAZolam (XANAX) 1 MG tablet. It had been denied due to her needing an appointment, but her last OV was 06/19/2022 and she already has one scheduled for 09/18/2022. Patient would like to know if it can get refilled and sent to [facility] instead. Patient would like a call back at [phone].

## 2022-09-18 ENCOUNTER — Other Ambulatory Visit: Payer: Self-pay

## 2022-09-18 ENCOUNTER — Ambulatory Visit: Payer: Medicare HMO | Admitting: Internal Medicine

## 2022-09-18 ENCOUNTER — Other Ambulatory Visit (HOSPITAL_COMMUNITY): Payer: Self-pay

## 2022-09-21 ENCOUNTER — Encounter: Payer: Self-pay | Admitting: Internal Medicine

## 2022-09-21 ENCOUNTER — Other Ambulatory Visit (HOSPITAL_COMMUNITY): Payer: Self-pay

## 2022-09-21 ENCOUNTER — Ambulatory Visit: Payer: No Typology Code available for payment source | Admitting: Internal Medicine

## 2022-09-21 ENCOUNTER — Telehealth: Payer: Self-pay | Admitting: Internal Medicine

## 2022-09-21 ENCOUNTER — Other Ambulatory Visit: Payer: Self-pay | Admitting: Internal Medicine

## 2022-09-21 ENCOUNTER — Ambulatory Visit: Payer: No Typology Code available for payment source

## 2022-09-21 VITALS — BP 114/68 | Temp 98.5°F | Ht 67.0 in | Wt 127.0 lb

## 2022-09-21 DIAGNOSIS — E785 Hyperlipidemia, unspecified: Secondary | ICD-10-CM

## 2022-09-21 DIAGNOSIS — E118 Type 2 diabetes mellitus with unspecified complications: Secondary | ICD-10-CM | POA: Diagnosis not present

## 2022-09-21 DIAGNOSIS — Z1211 Encounter for screening for malignant neoplasm of colon: Secondary | ICD-10-CM

## 2022-09-21 DIAGNOSIS — E1159 Type 2 diabetes mellitus with other circulatory complications: Secondary | ICD-10-CM | POA: Diagnosis not present

## 2022-09-21 DIAGNOSIS — Z0000 Encounter for general adult medical examination without abnormal findings: Secondary | ICD-10-CM | POA: Diagnosis not present

## 2022-09-21 DIAGNOSIS — Z23 Encounter for immunization: Secondary | ICD-10-CM

## 2022-09-21 DIAGNOSIS — E039 Hypothyroidism, unspecified: Secondary | ICD-10-CM

## 2022-09-21 DIAGNOSIS — I152 Hypertension secondary to endocrine disorders: Secondary | ICD-10-CM | POA: Diagnosis not present

## 2022-09-21 DIAGNOSIS — R9431 Abnormal electrocardiogram [ECG] [EKG]: Secondary | ICD-10-CM

## 2022-09-21 DIAGNOSIS — D696 Thrombocytopenia, unspecified: Secondary | ICD-10-CM

## 2022-09-21 DIAGNOSIS — I2510 Atherosclerotic heart disease of native coronary artery without angina pectoris: Secondary | ICD-10-CM

## 2022-09-21 DIAGNOSIS — K635 Polyp of colon: Secondary | ICD-10-CM

## 2022-09-21 DIAGNOSIS — R0609 Other forms of dyspnea: Secondary | ICD-10-CM

## 2022-09-21 DIAGNOSIS — F5101 Primary insomnia: Secondary | ICD-10-CM

## 2022-09-21 DIAGNOSIS — J418 Mixed simple and mucopurulent chronic bronchitis: Secondary | ICD-10-CM | POA: Diagnosis not present

## 2022-09-21 LAB — TROPONIN I (HIGH SENSITIVITY): High Sens Troponin I: 4 ng/L (ref 2–17)

## 2022-09-21 LAB — CBC WITH DIFFERENTIAL/PLATELET
Basophils Absolute: 0 10*3/uL (ref 0.0–0.1)
Basophils Relative: 0.5 % (ref 0.0–3.0)
Eosinophils Absolute: 0 10*3/uL (ref 0.0–0.7)
Eosinophils Relative: 0.1 % (ref 0.0–5.0)
HCT: 44.1 % (ref 36.0–46.0)
Hemoglobin: 14.1 g/dL (ref 12.0–15.0)
Lymphocytes Relative: 43.7 % (ref 12.0–46.0)
Lymphs Abs: 3 10*3/uL (ref 0.7–4.0)
MCHC: 31.9 g/dL (ref 30.0–36.0)
MCV: 96.8 fl (ref 78.0–100.0)
Monocytes Absolute: 0.3 10*3/uL (ref 0.1–1.0)
Monocytes Relative: 4.9 % (ref 3.0–12.0)
Neutro Abs: 3.5 10*3/uL (ref 1.4–7.7)
Neutrophils Relative %: 50.8 % (ref 43.0–77.0)
Platelets: 148 10*3/uL — ABNORMAL LOW (ref 150.0–400.0)
RBC: 4.55 Mil/uL (ref 3.87–5.11)
RDW: 13 % (ref 11.5–15.5)
WBC: 6.8 10*3/uL (ref 4.0–10.5)

## 2022-09-21 LAB — BASIC METABOLIC PANEL
BUN: 10 mg/dL (ref 6–23)
CO2: 27 mEq/L (ref 19–32)
Calcium: 9.6 mg/dL (ref 8.4–10.5)
Chloride: 103 mEq/L (ref 96–112)
Creatinine, Ser: 0.85 mg/dL (ref 0.40–1.20)
GFR: 72.88 mL/min (ref >60.00)
Glucose, Bld: 86 mg/dL (ref 70–99)
Potassium: 4.3 mEq/L (ref 3.5–5.1)
Sodium: 137 mEq/L (ref 135–145)

## 2022-09-21 LAB — URINALYSIS, ROUTINE W REFLEX MICROSCOPIC
Hgb urine dipstick: NEGATIVE
Leukocytes,Ua: NEGATIVE
Nitrite: NEGATIVE
Specific Gravity, Urine: 1.025 (ref 1.000–1.030)
Total Protein, Urine: NEGATIVE
Urine Glucose: NEGATIVE
Urobilinogen, UA: 0.2 (ref 0.0–1.0)
pH: 6 (ref 5.0–8.0)

## 2022-09-21 LAB — MICROALBUMIN / CREATININE URINE RATIO
Creatinine,U: 248 mg/dL
Microalb Creat Ratio: 0.6 mg/g (ref 0.0–30.0)
Microalb, Ur: 1.6 mg/dL (ref 0.0–1.9)

## 2022-09-21 LAB — TSH: TSH: 0.02 u[IU]/mL — ABNORMAL LOW (ref 0.35–5.50)

## 2022-09-21 LAB — BRAIN NATRIURETIC PEPTIDE: Pro B Natriuretic peptide (BNP): 36 pg/mL (ref 0.0–100.0)

## 2022-09-21 LAB — HEMOGLOBIN A1C: Hgb A1c MFr Bld: 5.4 % (ref 4.6–6.5)

## 2022-09-21 MED ORDER — PRAVASTATIN SODIUM 20 MG PO TABS
20.0000 mg | ORAL_TABLET | Freq: Every day | ORAL | 0 refills | Status: DC
Start: 2022-09-21 — End: 2022-10-11
  Filled 2022-09-21: qty 90, 90d supply, fill #0

## 2022-09-21 MED ORDER — BOOSTRIX 5-2.5-18.5 LF-MCG/0.5 IM SUSY
0.5000 mL | PREFILLED_SYRINGE | Freq: Once | INTRAMUSCULAR | 0 refills | Status: AC
Start: 2022-09-21 — End: 2022-09-22
  Filled 2022-09-21: qty 0.5, 1d supply, fill #0

## 2022-09-21 MED ORDER — REVEFENACIN 175 MCG/3ML IN SOLN: 175.0000 ug | Freq: Every day | RESPIRATORY_TRACT | 1 refills | Status: DC

## 2022-09-21 MED ORDER — LEVOTHYROXINE SODIUM 75 MCG PO TABS
75.0000 ug | ORAL_TABLET | Freq: Every day | ORAL | 0 refills | Status: DC
Start: 2022-09-21 — End: 2022-10-11
  Filled 2022-09-21: qty 90, 90d supply, fill #0

## 2022-09-21 MED ORDER — ESZOPICLONE 2 MG PO TABS
2.0000 mg | ORAL_TABLET | Freq: Every evening | ORAL | 0 refills | Status: DC | PRN
Start: 2022-09-21 — End: 2023-02-13
  Filled 2022-09-21: qty 30, 30d supply, fill #0

## 2022-09-21 MED ORDER — REVEFENACIN 175 MCG/3ML IN SOLN
175.0000 ug | Freq: Every day | RESPIRATORY_TRACT | 1 refills | Status: DC
Start: 2022-09-21 — End: 2022-10-26

## 2022-09-21 NOTE — Progress Notes (Signed)
Subjective:  Patient ID: Tasha [name], female    DOB: April 06, 1960  Age: 62 y.o. MRN: [id number]  CC: COPD and Hypothyroidism   HPI NITISHA FATICA presents for f/up ----  Discussed the use of AI scribe software for clinical note transcription with the patient, who gave verbal consent to proceed.  History of Present Illness   The patient presents with a chief complaint of chest pain and productive cough, producing white, bubbly phlegm, particularly in the mornings. They report no associated shortness of breath. The patient has a history of chronic bronchitis and has been using Albuterol as needed, with the last use being approximately a week ago due to humid weather. They also report experiencing chest pain, localized in the central chest area. The patient denies any history of coronary artery disease.  The patient also reports a significant weight loss with mounjaro, which they are pleased with, but expresses concern about muscle loss and flabbiness. They deny any exercise-induced shortness of breath but note becoming short of breath after climbing more than ten steps.  The patient has a history of thyroid disease and is currently on medication, but reports hair loss and feeling weak the previous day. They also report a history of diabetes and are due for a check for diabetic kidney damage.  The patient has a history of smoking but reports minimal current use, instead occasionally using a vapor cigarette. They also report difficulty sleeping, often waking after only an hour or two of sleep. They have been using Xanax to aid sleep.       Outpatient Medications Prior to Visit  Medication Sig Dispense Refill   albuterol (VENTOLIN HFA) 108 (90 Base) MCG/ACT inhaler Inhale 2 puffs into the lungs every 6 (six) hours as needed for wheezing or shortness of breath 6.7 g 3   ALPRAZolam (XANAX) 1 MG tablet Take 1 tablet (1 mg) by mouth 3 times daily as needed for anxiety. 90 tablet 1    buPROPion (WELLBUTRIN XL) 150 MG 24 hr tablet Take 1 tablet (150 mg total) by mouth daily. 90 tablet 1   celecoxib (CELEBREX) 100 MG capsule      ezetimibe (ZETIA) 10 MG tablet Take 1 tablet (10 mg total) by mouth daily. 90 tablet 0   omega-3 acid ethyl esters (LOVAZA) 1 g capsule Take 2 capsules by mouth 2 (two) times daily.     potassium chloride SA (KLOR-CON M) 20 MEQ tablet Take 1 tablet (20 mEq total) by mouth 2 (two) times daily. 180 tablet 0   sertraline (ZOLOFT) 100 MG tablet Take 1 tablet (100 mg total) by mouth daily. 90 tablet 1   tirzepatide (MOUNJARO) 7.5 MG/0.5ML Pen Inject 7.5 mg into the skin once a week. 6 mL 0   Fluticasone-Umeclidin-Vilant (TRELEGY ELLIPTA) 100-62.5-25 MCG/ACT AEPB Inhale 1 puff into the lungs daily. 60 each 2   levothyroxine (SYNTHROID) 112 MCG tablet Take 1 tablet (112 mcg total) by mouth daily. 90 tablet 0   lisinopril (ZESTRIL) 5 MG tablet Take 1 tablet (5 mg total) by mouth daily. 90 tablet 0   pravastatin (PRAVACHOL) 20 MG tablet Take 1 tablet (20 mg total) by mouth daily. 90 tablet 0   No facility-administered medications prior to visit.    ROS Review of Systems  Constitutional:  Negative for appetite change, chills, diaphoresis, fatigue and fever.  HENT: Negative.    Eyes:  Negative for visual disturbance.  Respiratory:  Positive for cough and shortness of breath. Negative for chest  tightness.   Cardiovascular:  Positive for chest pain. Negative for palpitations and leg swelling.  Gastrointestinal:  Negative for abdominal pain, constipation, diarrhea and vomiting.  Endocrine: Positive for cold intolerance and heat intolerance.  Genitourinary: Negative.  Negative for difficulty urinating.  Musculoskeletal: Negative.  Negative for arthralgias and myalgias.  Skin: Negative.  Negative for color change.  Neurological: Negative.  Negative for dizziness.  Hematological:  Negative for adenopathy. Does not bruise/bleed easily.  Psychiatric/Behavioral:   Positive for confusion and sleep disturbance. Negative for decreased concentration, dysphoric mood and hallucinations. The patient is nervous/anxious.     Objective:  BP 114/68 (BP Location: Left Arm, Patient Position: Sitting, Cuff Size: Large)   Temp 98.5 F (36.9 C)   Ht 5\' 7"  (1.702 m)   Wt 127 lb (57.6 kg)   BMI 19.89 kg/m   BP Readings from Last 3 Encounters:  09/21/22 114/68  09/21/22 114/68  06/19/22 122/76    Wt Readings from Last 3 Encounters:  09/21/22 127 lb (57.6 kg)  09/21/22 127 lb (57.6 kg)  06/19/22 148 lb (67.1 kg)    Physical Exam Vitals reviewed.  Constitutional:      Appearance: Normal appearance.  HENT:     Nose: Nose normal.     Mouth/Throat:     Mouth: Mucous membranes are moist.  Eyes:     General: No scleral icterus.    Conjunctiva/sclera: Conjunctivae normal.  Cardiovascular:     Rate and Rhythm: Normal rate and regular rhythm.     Pulses: Normal pulses.     Heart sounds: Normal heart sounds, S1 normal and S2 normal. No murmur heard.    No friction rub. No gallop.     Comments: EKG- NSR, 71 bpm Low voltage NS ST T wave changes +++artifact No Q waves Pulmonary:     Effort: Pulmonary effort is normal.     Breath sounds: No stridor. No wheezing, rhonchi or rales.  Abdominal:     General: Abdomen is flat.     Tenderness: There is no abdominal tenderness. There is no guarding or rebound.     Hernia: No hernia is present.  Musculoskeletal:     Cervical back: Neck supple.     Right lower leg: No edema.     Left lower leg: No edema.  Skin:    General: Skin is warm and dry.  Neurological:     General: No focal deficit present.     Mental Status: She is alert. Mental status is at baseline.  Psychiatric:        Mood and Affect: Mood normal.        Behavior: Behavior normal.     Lab Results  Component Value Date   WBC 6.8 09/21/2022   HGB 14.1 09/21/2022   HCT 44.1 09/21/2022   PLT 148.0 (L) 09/21/2022   GLUCOSE 86 09/21/2022    CHOL 166 06/19/2022   TRIG 97.0 06/19/2022   HDL 37.70 (L) 06/19/2022   LDLDIRECT 140.0 02/14/2021   LDLCALC 109 (H) 06/19/2022   ALT 28 10/15/2021   AST 38 10/15/2021   NA 137 09/21/2022   K 4.3 09/21/2022   CL 103 09/21/2022   CREATININE 0.85 09/21/2022   BUN 10 09/21/2022   CO2 27 09/21/2022   TSH 0.02 (L) 09/21/2022   INR 1.0 01/27/2019   HGBA1C 5.4 09/21/2022   MICROALBUR 1.6 09/21/2022    MM 3D SCREEN BREAST BILATERAL  Result Date: 03/23/2022 CLINICAL DATA:  Screening. EXAM: DIGITAL SCREENING BILATERAL  MAMMOGRAM WITH TOMOSYNTHESIS AND CAD TECHNIQUE: Bilateral screening digital craniocaudal and mediolateral oblique mammograms were obtained. Bilateral screening digital breast tomosynthesis was performed. The images were evaluated with computer-aided detection. COMPARISON:  Previous exam(s). ACR Breast Density Category b: There are scattered areas of fibroglandular density. FINDINGS: There are no findings suspicious for malignancy. IMPRESSION: No mammographic evidence of malignancy. A result letter of this screening mammogram will be mailed directly to the patient. RECOMMENDATION: Screening mammogram in one year. (Code:SM-B-01Y) BI-RADS CATEGORY  1: Negative. Electronically Signed   By: Sherron Ales M.D.   On: 03/23/2022 11:58    Assessment & Plan:   Type II diabetes mellitus with manifestations (HCC)- Her blood sugar is well-controlled. -     Hemoglobin A1c; Future -     Microalbumin / creatinine urine ratio; Future  Acquired hypothyroidism- Will decrease her T4 dosage. -     TSH; Future -     Levothyroxine Sodium; Take 1 tablet (75 mcg total) by mouth daily.  Dispense: 90 tablet; Refill: 0  Hypertension associated with diabetes (HCC)- Her blood pressure is overcontrolled and she complains of cough.  Will discontinue the ACE inhibitor. -     TSH; Future -     Urinalysis, Routine w reflex microscopic; Future -     CBC with Differential/Platelet; Future -     Basic metabolic  panel; Future -     EKG 12-Lead  Coronary artery disease involving native coronary artery of native heart without angina pectoris- Will evaluate for ischemia. -     EKG 12-Lead -     Troponin I (High Sensitivity); Future -     Brain natriuretic peptide; Future -     Pravastatin Sodium; Take 1 tablet (20 mg total) by mouth daily.  Dispense: 90 tablet; Refill: 0 -     MYOCARDIAL PERFUSION IMAGING; Future -     Cardiac Stress Test: Informed Consent Details: Physician/Practitioner Attestation; Transcribe to consent form and obtain patient signature; Future  Mixed simple and mucopurulent chronic bronchitis (HCC)- She failed a hand-held device with Trelegy.  I have recommended a nebulized LAMA. -     For home use only DME Nebulizer machine -     Revefenacin; Take 3 mLs (175 mcg total) by nebulization daily.  Dispense: 90 mL; Refill: 1  Abnormal electrocardiogram (ECG) (EKG) -     Troponin I (High Sensitivity); Future -     Brain natriuretic peptide; Future -     MYOCARDIAL PERFUSION IMAGING; Future -     Cardiac Stress Test: Informed Consent Details: Physician/Practitioner Attestation; Transcribe to consent form and obtain patient signature; Future  Need for prophylactic vaccination with combined diphtheria-tetanus-pertussis (DTP) vaccine -     Boostrix; Inject 0.5 mLs into the muscle once for 1 dose.  Dispense: 0.5 mL; Refill: 0  Polyp of colon, unspecified part of colon, unspecified type -     Ambulatory referral to Gastroenterology  Primary insomnia -     Eszopiclone; Take 1 tablet (2 mg total) by mouth at bedtime as needed for sleep. Take immediately before bedtime  Dispense: 90 tablet; Refill: 0  Hyperlipidemia with target LDL less than 130 -     Pravastatin Sodium; Take 1 tablet (20 mg total) by mouth daily.  Dispense: 90 tablet; Refill: 0  DOE (dyspnea on exertion) -     MYOCARDIAL PERFUSION IMAGING; Future -     Cardiac Stress Test: Informed Consent Details:  Physician/Practitioner Attestation; Transcribe to consent form and obtain patient signature; Future  Thrombocytopenia (HCC)     Follow-up: Return in about 3 months (around 12/22/2022).  Sanda Linger, MD

## 2022-09-21 NOTE — Patient Instructions (Addendum)
Tasha [name] , Thank you for taking time to come for your Medicare Wellness Visit. I appreciate your ongoing commitment to your health goals. Please review the following plan we discussed and let me know if I can assist you in the future.   Referrals/Orders/Follow-Ups/Clinician Recommendations: Yes; [facility] for screening colonoscopy.  This is a list of the screening recommended for you and due dates:  Health Maintenance  Topic Date Due   DTaP/Tdap/Td vaccine (2 - Td or Tdap) 08/16/2021   Colon Cancer Screening  06/23/2022   Yearly kidney health urinalysis for diabetes  10/12/2022   Flu Shot  05/07/2023*   Complete foot exam   10/12/2022   Hemoglobin A1C  12/20/2022   Eye exam for diabetics  12/29/2022   Yearly kidney function blood test for diabetes  06/19/2023   Medicare Annual Wellness Visit  09/21/2023   Mammogram  03/22/2024   Hepatitis C Screening  Completed   HIV Screening  Completed   Zoster (Shingles) Vaccine  Completed   HPV Vaccine  Aged Out   Screening for Lung Cancer  Discontinued   COVID-19 Vaccine  Discontinued  *Topic was postponed. The date shown is not the original due date.    Advanced directives: (Declined) Advance directive discussed with you today. Even though you declined this today, please call our office should you change your mind, and we can give you the proper paperwork for you to fill out.  Next Medicare Annual Wellness Visit scheduled for next year: No  Preventive Care 40-64 Years, Female Preventive care refers to lifestyle choices and visits with your health care provider that can promote health and wellness. What does preventive care include? A yearly physical exam. This is also called an annual well check. Dental exams once or twice a year. Routine eye exams. Ask your health care provider how often you should have your eyes checked. Personal lifestyle choices, including: Daily care of your teeth and gums. Regular physical  activity. Eating a healthy diet. Avoiding tobacco and drug use. Limiting alcohol use. Practicing safe sex. Taking low-dose aspirin daily starting at age 32. Taking vitamin and mineral supplements as recommended by your health care provider. What happens during an annual well check? The services and screenings done by your health care provider during your annual well check will depend on your age, overall health, lifestyle risk factors, and family history of disease. Counseling  Your health care provider may ask you questions about your: Alcohol use. Tobacco use. Drug use. Emotional well-being. Home and relationship well-being. Sexual activity. Eating habits. Work and work Astronomer. Method of birth control. Menstrual cycle. Pregnancy history. Screening  You may have the following tests or measurements: Height, weight, and BMI. Blood pressure. Lipid and cholesterol levels. These may be checked every 5 years, or more frequently if you are over 47 years old. Skin check. Lung cancer screening. You may have this screening every year starting at age 22 if you have a 30-pack-year history of smoking and currently smoke or have quit within the past 15 years. Fecal occult blood test (FOBT) of the stool. You may have this test every year starting at age 73. Flexible sigmoidoscopy or colonoscopy. You may have a sigmoidoscopy every 5 years or a colonoscopy every 10 years starting at age 38. Hepatitis C blood test. Hepatitis B blood test. Sexually transmitted disease (STD) testing. Diabetes screening. This is done by checking your blood sugar (glucose) after you have not eaten for a while (fasting). You may have this  done every 1-3 years. Mammogram. This may be done every 1-2 years. Talk to your health care provider about when you should start having regular mammograms. This may depend on whether you have a family history of breast cancer. BRCA-related cancer screening. This may be done if  you have a family history of breast, ovarian, tubal, or peritoneal cancers. Pelvic exam and Pap test. This may be done every 3 years starting at age 72. Starting at age 8, this may be done every 5 years if you have a Pap test in combination with an HPV test. Bone density scan. This is done to screen for osteoporosis. You may have this scan if you are at high risk for osteoporosis. Discuss your test results, treatment options, and if necessary, the need for more tests with your health care provider. Vaccines  Your health care provider may recommend certain vaccines, such as: Influenza vaccine. This is recommended every year. Tetanus, diphtheria, and acellular pertussis (Tdap, Td) vaccine. You may need a Td booster every 10 years. Zoster vaccine. You may need this after age 58. Pneumococcal 13-valent conjugate (PCV13) vaccine. You may need this if you have certain conditions and were not previously vaccinated. Pneumococcal polysaccharide (PPSV23) vaccine. You may need one or two doses if you smoke cigarettes or if you have certain conditions. Talk to your health care provider about which screenings and vaccines you need and how often you need them. This information is not intended to replace advice given to you by your health care provider. Make sure you discuss any questions you have with your health care provider. Document Released: 02/19/2015 Document Revised: 10/13/2015 Document Reviewed: 11/24/2014 Elsevier Interactive Patient Education  2017 [employer].    Fall Prevention in the Home Falls can cause injuries. They can happen to people of all ages. There are many things you can do to make your home safe and to help prevent falls. What can I do on the outside of my home? Regularly fix the edges of walkways and driveways and fix any cracks. Remove anything that might make you trip as you walk through a door, such as a raised step or threshold. Trim any bushes or trees on the path to your  home. Use bright outdoor lighting. Clear any walking paths of anything that might make someone trip, such as rocks or tools. Regularly check to see if handrails are loose or broken. Make sure that both sides of any steps have handrails. Any raised decks and porches should have guardrails on the edges. Have any leaves, snow, or ice cleared regularly. Use sand or salt on walking paths during winter. Clean up any spills in your garage right away. This includes oil or grease spills. What can I do in the bathroom? Use night lights. Install grab bars by the toilet and in the tub and shower. Do not use towel bars as grab bars. Use non-skid mats or decals in the tub or shower. If you need to sit down in the shower, use a plastic, non-slip stool. Keep the floor dry. Clean up any water that spills on the floor as soon as it happens. Remove soap buildup in the tub or shower regularly. Attach bath mats securely with double-sided non-slip rug tape. Do not have throw rugs and other things on the floor that can make you trip. What can I do in the bedroom? Use night lights. Make sure that you have a light by your bed that is easy to reach. Do not use any  sheets or blankets that are too big for your bed. They should not hang down onto the floor. Have a firm chair that has side arms. You can use this for support while you get dressed. Do not have throw rugs and other things on the floor that can make you trip. What can I do in the kitchen? Clean up any spills right away. Avoid walking on wet floors. Keep items that you use a lot in easy-to-reach places. If you need to reach something above you, use a strong step stool that has a grab bar. Keep electrical cords out of the way. Do not use floor polish or wax that makes floors slippery. If you must use wax, use non-skid floor wax. Do not have throw rugs and other things on the floor that can make you trip. What can I do with my stairs? Do not leave any  items on the stairs. Make sure that there are handrails on both sides of the stairs and use them. Fix handrails that are broken or loose. Make sure that handrails are as long as the stairways. Check any carpeting to make sure that it is firmly attached to the stairs. Fix any carpet that is loose or worn. Avoid having throw rugs at the top or bottom of the stairs. If you do have throw rugs, attach them to the floor with carpet tape. Make sure that you have a light switch at the top of the stairs and the bottom of the stairs. If you do not have them, ask someone to add them for you. What else can I do to help prevent falls? Wear shoes that: Do not have high heels. Have rubber bottoms. Are comfortable and fit you well. Are closed at the toe. Do not wear sandals. If you use a stepladder: Make sure that it is fully opened. Do not climb a closed stepladder. Make sure that both sides of the stepladder are locked into place. Ask someone to hold it for you, if possible. Clearly mark and make sure that you can see: Any grab bars or handrails. First and last steps. Where the edge of each step is. Use tools that help you move around (mobility aids) if they are needed. These include: Canes. Walkers. Scooters. Crutches. Turn on the lights when you go into a dark area. Replace any light bulbs as soon as they burn out. Set up your furniture so you have a clear path. Avoid moving your furniture around. If any of your floors are uneven, fix them. If there are any pets around you, be aware of where they are. Review your medicines with your doctor. Some medicines can make you feel dizzy. This can increase your chance of falling. Ask your doctor what other things that you can do to help prevent falls. This information is not intended to replace advice given to you by your health care provider. Make sure you discuss any questions you have with your health care provider. Document Released: 11/19/2008  Document Revised: 07/01/2015 Document Reviewed: 02/27/2014 Elsevier Interactive Patient Education  2017 [employer].

## 2022-09-21 NOTE — Patient Instructions (Signed)

## 2022-09-21 NOTE — Progress Notes (Addendum)
Subjective:   Tasha [name] is a 62 y.o. female who presents for Medicare Annual (Subsequent) preventive examination.  Visit Complete: In person  Sleep Patterns: No sleep issues, uses a sleep aid, feels rested on waking and sleeps 7-8 hours nightly. Home Safety/Smoke Alarms: Feels safe in home; uses home alarm. Smoke alarms in place. Living environment: 2-story home; Lives with spouse; no needs for DME; good support system. Seat Belt Safety/Bike Helmet: Wears seat belt.  Review of Systems     Cardiac Risk Factors include: advanced age (>68men, >79 women);sedentary lifestyle;diabetes mellitus;dyslipidemia;family history of premature cardiovascular disease;hypertension     Objective:    Today's Vitals   09/21/22 1343  Height: 5\' 7"  (1.702 m)  PainSc: 7   PainLoc: Knee   Body mass index is 23.18 kg/m.     09/21/2022    1:48 PM 10/15/2021    8:01 AM 08/22/2021    3:24 PM 04/08/2021    3:05 PM 10/19/2020   10:12 AM 03/25/2020   12:18 PM 10/02/2019   10:01 AM  Advanced Directives  Does Patient Have a Medical Advance Directive? No No No No No No No  Would patient like information on creating a medical advance directive? No - Patient declined  No - Patient declined No - Patient declined No - Patient declined No - Patient declined No - Patient declined    Current Medications (verified) Outpatient Encounter Medications as of 09/21/2022  Medication Sig   albuterol (VENTOLIN HFA) 108 (90 Base) MCG/ACT inhaler Inhale 2 puffs into the lungs every 6 (six) hours as needed for wheezing or shortness of breath   ALPRAZolam (XANAX) 1 MG tablet Take 1 tablet (1 mg) by mouth 3 times daily as needed for anxiety.   buPROPion (WELLBUTRIN XL) 150 MG 24 hr tablet Take 1 tablet (150 mg total) by mouth daily.   celecoxib (CELEBREX) 100 MG capsule    ezetimibe (ZETIA) 10 MG tablet Take 1 tablet (10 mg total) by mouth daily.   levothyroxine (SYNTHROID) 112 MCG tablet Take 1 tablet (112 mcg total) by  mouth daily.   omega-3 acid ethyl esters (LOVAZA) 1 g capsule Take 2 capsules by mouth 2 (two) times daily.   potassium chloride SA (KLOR-CON M) 20 MEQ tablet Take 1 tablet (20 mEq total) by mouth 2 (two) times daily.   pravastatin (PRAVACHOL) 20 MG tablet Take 1 tablet (20 mg total) by mouth daily.   sertraline (ZOLOFT) 100 MG tablet Take 1 tablet (100 mg total) by mouth daily.   tirzepatide (MOUNJARO) 7.5 MG/0.5ML Pen Inject 7.5 mg into the skin once a week.   [DISCONTINUED] ARIPiprazole (ABILIFY) 2 MG tablet Take 1 tablet (2 mg total) by mouth daily.   [DISCONTINUED] divalproex (DEPAKOTE) 500 MG DR tablet Take 1 tablet (500 mg total) by mouth 2 (two) times daily.   No facility-administered encounter medications on file as of 09/21/2022.    Allergies (verified) Farxiga [dapagliflozin], Metformin and related, Semaglutide, Semaglutide(0.25 or 0.5mg -dos), Statins, Augmentin [amoxicillin-pot clavulanate], Crestor [rosuvastatin], Lipitor [atorvastatin], Lisinopril, Zocor [simvastatin], and Other   History: Past Medical History:  Diagnosis Date   Allergy    Anemia    past hx    Anxiety state, unspecified    Arthralgia of temporomandibular joint    Arthritis    Asthma    as a child , not now per pt    Conversion disorder    Dementia (HCC)    Depressive disorder, not elsewhere classified    Diabetes mellitus  without complication (HCC)    off all medicines as of 04-16-2019 PV    Dysphagia, unspecified(787.20)    Endometriosis    Esophageal reflux    Fibromyalgia    Headache(784.0)    Heart murmur    past hx murmur    Hx of adenomatous colonic polyps    Hypertension 07/18/2020   Irritable bowel syndrome    Mitral valve disorders(424.0)    Myalgia and myositis, unspecified    Overweight(278.02)    Periodic limb movement disorder    Pure hypercholesterolemia    RLS (restless legs syndrome)    Seizures (HCC)    last episode 5 yrs ago    Stroke [facility])    TIA per pt many years ago     Type II diabetes mellitus with manifestations (HCC) 03/16/2022   Unspecified hypothyroidism    Past Surgical History:  Procedure Laterality Date   arm surgery     COLONOSCOPY  04/14/2008   FOOT SURGERY     LIPOMA EXCISION     BESIDES BELLY BUTTON   POLYPECTOMY     HPP x 1   TONSILLECTOMY     TOTAL ABDOMINAL HYSTERECTOMY     TAH   UPPER GASTROINTESTINAL ENDOSCOPY  2010   [facility]    WRIST SURGERY     Family History  Problem Relation Age of Onset   Heart disease Mother    Heart disease Father    Diabetes Father    Colon cancer Father 6       died age 18 from colon cancer spreading per pt    Breast cancer Maternal Aunt    Colon cancer Paternal Aunt    Stomach cancer Paternal Aunt    Lung cancer Paternal Aunt    Rectal cancer Paternal Aunt    Stomach cancer Paternal Uncle    Colon polyps Neg Hx    Esophageal cancer Neg Hx    Social History   Socioeconomic History   Marital status: Married    Spouse name: Press photographer [employer]   Number of children: 2   Years of education: 12   Highest education level: Not on file  Occupational History   Occupation: Disabled    Employer: [employer]  Tobacco Use   Smoking status: Some Days    Current packs/day: 0.25    Average packs/day: 0.3 packs/day for 43.0 years (10.8 ttl pk-yrs)    Types: Cigarettes   Smokeless tobacco: Never   Tobacco comments:    1-2 per day  Vaping Use   Vaping status: Former   Substances: Nicotine, Flavoring  Substance and Sexual Activity   Alcohol use: Yes    Comment: rarely   Drug use: No   Sexual activity: Yes  Other Topics Concern   Not on file  Social History Narrative   Lives w/ husband Jearld Shines Wiederhold   Caffeine use: 2 cups coffee per day   Tea-3-4 drinks   Right handed    Social Determinants of Health   Financial Resource Strain: Low Risk  (09/21/2022)   Overall Financial Resource Strain (CARDIA)    Difficulty of Paying Living Expenses: Not hard at all  Food Insecurity: No Food  Insecurity (09/21/2022)   Hunger Vital Sign    Worried About Running Out of Food in the Last Year: Never true    Ran Out of Food in the Last Year: Never true  Transportation Needs: No Transportation Needs (09/21/2022)   PRAPARE - Administrator, Civil Service (Medical):  No    Lack of Transportation (Non-Medical): No  Physical Activity: Inactive (09/21/2022)   Exercise Vital Sign    Days of Exercise per Week: 0 days    Minutes of Exercise per Session: 0 min  Stress: No Stress Concern Present (09/21/2022)   [employer] of Occupational Health - Occupational Stress Questionnaire    Feeling of Stress : Not at all  Social Connections: Moderately Isolated (09/21/2022)   Social Connection and Isolation Panel [NHANES]    Frequency of Communication with Friends and Family: More than three times a week    Frequency of Social Gatherings with Friends and Family: More than three times a week    Attends Religious Services: Never    Database administrator or Organizations: No    Attends Engineer, structural: Never    Marital Status: Married    Tobacco Counseling Ready to quit: Not Answered Counseling given: Not Answered Tobacco comments: 1-2 per day   Clinical Intake:  Pre-visit preparation completed: Yes  Pain : 0-10 Pain Score: 7  Pain Type: Chronic pain Pain Location: Knee Pain Orientation: Left, Right     Nutritional Risks: None Diabetes: No  How often do you need to have someone help you when you read instructions, pamphlets, or other written materials from your doctor or pharmacy?: 1 - Never What is the last grade level you completed in school?: HSG  Interpreter Needed?: No  Information entered by :: Susie Cassette, LPN.   Activities of Daily Living    09/21/2022    1:49 PM  In your present state of health, do you have any difficulty performing the following activities:  Hearing? 0  Vision? 0  Difficulty concentrating or making decisions? 0   Walking or climbing stairs? 0  Dressing or bathing? 0  Doing errands, shopping? 0  Preparing Food and eating ? N  Using the Toilet? N  In the past six months, have you accidently leaked urine? Y  Do you have problems with loss of bowel control? N  Managing your Medications? N  Managing your Finances? N  Housekeeping or managing your Housekeeping? N    Patient Care Team: Etta Grandchild, MD as PCP - General (Internal Medicine)  Indicate any recent Medical Services you may have received from other than [facility] providers in the past year (date may be approximate).     Assessment:   This is a routine wellness examination for [place].  Hearing/Vision screen Hearing Screening - Comments:: Patient denied any hearing difficulty.   No hearing aids.  Vision Screening - Comments:: Patient does wear corrective lenses/contacts.  Annual eye exam done by: [facility]   Dietary issues and exercise activities discussed:     Goals Addressed             This Visit's Progress    Client understands the importance of follow-up with providers by attending scheduled visits        Depression Screen    09/21/2022    1:45 PM 06/19/2022    8:13 AM 08/22/2021    3:36 PM 07/22/2021    9:58 AM 03/15/2021   10:13 AM 02/14/2021    2:34 PM 10/19/2020   10:11 AM  PHQ 2/9 Scores  PHQ - 2 Score 0 0 2 3 0 0 1  PHQ- 9 Score 0 0 15 16 0      Fall Risk    09/21/2022    1:49 PM 08/22/2021    3:25 PM 07/22/2021  9:57 AM 02/14/2021    2:34 PM 10/19/2020   10:11 AM  Fall Risk   Falls in the past year? 0 1 1 1  0  Number falls in past yr: 0 1 1 0   Injury with Fall? 0 1 1 1    Risk for fall due to : No Fall Risks Impaired balance/gait;Orthopedic patient     Follow up Falls prevention discussed Falls prevention discussed       MEDICARE RISK AT HOME:  Medicare Risk at Home - 09/21/22 1349     Any stairs in or around the home? Yes    If so, are there any without handrails? No    Home free of  loose throw rugs in walkways, pet beds, electrical cords, etc? Yes    Adequate lighting in your home to reduce risk of falls? Yes    Life alert? No    Use of a cane, walker or w/c? No    Grab bars in the bathroom? Yes    Shower chair or bench in shower? No    Elevated toilet seat or a handicapped toilet? Yes             TIMED UP AND GO:  Was the test performed?  Yes  Length of time to ambulate 10 feet: 8 sec Gait steady and fast without use of assistive device    Cognitive Function:    09/07/2017    8:58 AM  MMSE - Mini Mental State Exam  Orientation to time 5  Orientation to Place 5  Registration 3  Attention/ Calculation 5  Recall 3  Language- name 2 objects 2  Language- repeat 1  Language- follow 3 step command 3  Language- read & follow direction 1  Write a sentence 1  Copy design 1  Total score 30        09/21/2022    1:49 PM 08/22/2021    4:20 PM  6CIT Screen  What Year? 0 points 0 points  What month? 0 points 0 points  What time? 0 points 0 points  Count back from 20 0 points 0 points  Months in reverse 0 points 0 points  Repeat phrase 0 points 0 points  Total Score 0 points 0 points    Immunizations Immunization History  Administered Date(s) Administered   Hep A / Hep B 10/24/2016, 11/23/2016, 04/23/2017   Influenza Split 12/06/2010, 02/27/2012   Influenza Whole 11/11/2007, 11/17/2009, 12/14/2009   Influenza,inj,Quad PF,6+ Mos 12/19/2012, 10/24/2016, 01/10/2018, 01/27/2019, 10/08/2019, 11/02/2020   Influenza-Unspecified 10/18/2014, 03/25/2016   Moderna Sars-Covid-2 Vaccination 10/10/2019, 11/21/2019   PNEUMOCOCCAL CONJUGATE-20 11/03/2020   Pneumococcal Polysaccharide-23 02/07/2008, 03/19/2017   Tdap 08/17/2011   Zoster Recombinant(Shingrix) 11/02/2020, 02/14/2021    TDAP status: Due, Education has been provided regarding the importance of this vaccine. Advised may receive this vaccine at local pharmacy or Health Dept. Aware to provide a copy of  the vaccination record if obtained from local pharmacy or Health Dept. Verbalized acceptance and understanding.  Flu Vaccine status: Due, Education has been provided regarding the importance of this vaccine. Advised may receive this vaccine at local pharmacy or Health Dept. Aware to provide a copy of the vaccination record if obtained from local pharmacy or Health Dept. Verbalized acceptance and understanding.  Pneumococcal vaccine status: Up to date  Covid-19 vaccine status: Completed vaccines  Qualifies for Shingles Vaccine? Yes   Zostavax completed No   Shingrix Completed?: Yes  Screening Tests Health Maintenance  Topic Date Due  DTaP/Tdap/Td (2 - Td or Tdap) 08/16/2021   Colonoscopy  06/23/2022   Diabetic kidney evaluation - Urine ACR  10/12/2022   INFLUENZA VACCINE  05/07/2023 (Originally 09/07/2022)   FOOT EXAM  10/12/2022   HEMOGLOBIN A1C  12/20/2022   OPHTHALMOLOGY EXAM  12/29/2022   Diabetic kidney evaluation - eGFR measurement  06/19/2023   Medicare Annual Wellness (AWV)  09/21/2023   MAMMOGRAM  03/22/2024   Hepatitis C Screening  Completed   HIV Screening  Completed   Zoster Vaccines- Shingrix  Completed   HPV VACCINES  Aged Out   Lung Cancer Screening  Discontinued   COVID-19 Vaccine  Discontinued    Health Maintenance  Health Maintenance Due  Topic Date Due   DTaP/Tdap/Td (2 - Td or Tdap) 08/16/2021   Colonoscopy  06/23/2022   Diabetic kidney evaluation - Urine ACR  10/12/2022    Colorectal cancer screening: Referral to GI placed 09/21/2022. Pt aware the office will call re: appt.  Mammogram status: Completed 03/22/2022. Repeat every year  Bone Density status: Ordered 03/2022. Pt provided with contact info and advised to call to schedule appt.  Lung Cancer Screening: (Low Dose CT Chest recommended if Age 67-80 years, 20 pack-year currently smoking OR have quit w/in 15years.) does qualify.   Lung Cancer Screening Referral: ordered 03/2022  Additional  Screening:  Hepatitis C Screening: does qualify; Completed 04/10/2016  Vision Screening: Recommended annual ophthalmology exams for early detection of glaucoma and other disorders of the eye. Is the patient up to date with their annual eye exam?  Yes  Who is the provider or what is the name of the office in which the patient attends annual eye exams? [facility] If pt is not established with a provider, would they like to be referred to a provider to establish care? No .   Dental Screening: Recommended annual dental exams for proper oral hygiene  Diabetic Foot Exam: Diabetic Foot Exam: Completed 10/11/2021  Community Resource Referral / Chronic Care Management: CRR required this visit?  No   CCM required this visit?  No     Plan:     I have personally reviewed and noted the following in the patient's chart:   Medical and social history Use of alcohol, tobacco or illicit drugs  Current medications and supplements including opioid prescriptions. Patient is not currently taking opioid prescriptions. Functional ability and status Nutritional status Physical activity Advanced directives List of other physicians Hospitalizations, surgeries, and ER visits in previous 12 months Vitals Screenings to include cognitive, depression, and falls Referrals and appointments  In addition, I have reviewed and discussed with patient certain preventive protocols, quality metrics, and best practice recommendations. A written personalized care plan for preventive services as well as general preventive health recommendations were provided to patient.     Mickeal Needy, LPN   05/15/8117   After Visit Summary: Printed and mailed to patient.  Nurse Notes: Normal cognitive status assessed by direct observation by this Nurse Health Advisor. No abnormalities found.

## 2022-09-21 NOTE — Telephone Encounter (Signed)
A representative from [facility] called and said they received the prescription for revefenacin (YUPELRI) 175 MCG/3ML nebulizer solution. The patient's insurance requires a written prescription for revefenacin (YUPELRI) 175 MCG/3ML nebulizer solution. They also require the chart notes for the medication. Their fax number is [phone]. Best callback is [phone].

## 2022-09-22 ENCOUNTER — Telehealth: Payer: Self-pay | Admitting: Internal Medicine

## 2022-09-22 NOTE — Telephone Encounter (Signed)
8/15 OV note has been faxed again

## 2022-09-22 NOTE — Telephone Encounter (Signed)
Pt is requesting a different type of sleeping medication, states the eszopiclone (LUNESTA) 2 MG TABS tablet  does not help at all.   [facility]   Phone: [phone]  Fax: [phone]

## 2022-09-22 NOTE — Telephone Encounter (Signed)
[facility] called back and said they still have not received the OV notes. They would like to know if they can be re-sent.

## 2022-09-22 NOTE — Telephone Encounter (Signed)
8/15 OV note has been printed and faxed.

## 2022-09-26 ENCOUNTER — Encounter (HOSPITAL_COMMUNITY): Payer: Self-pay

## 2022-09-26 ENCOUNTER — Other Ambulatory Visit (HOSPITAL_COMMUNITY): Payer: Self-pay

## 2022-09-27 ENCOUNTER — Ambulatory Visit (HOSPITAL_COMMUNITY): Payer: No Typology Code available for payment source | Attending: Cardiology

## 2022-09-27 DIAGNOSIS — I2510 Atherosclerotic heart disease of native coronary artery without angina pectoris: Secondary | ICD-10-CM | POA: Insufficient documentation

## 2022-09-27 DIAGNOSIS — R0609 Other forms of dyspnea: Secondary | ICD-10-CM | POA: Diagnosis not present

## 2022-09-27 DIAGNOSIS — R9431 Abnormal electrocardiogram [ECG] [EKG]: Secondary | ICD-10-CM | POA: Insufficient documentation

## 2022-09-27 LAB — MYOCARDIAL PERFUSION IMAGING
Base ST Depression (mm): 0 mm
Estimated workload: 1
Exercise duration (min): 1 min
Exercise duration (sec): 0 s
LV dias vol: 57 mL (ref 46–106)
LV sys vol: 16 mL
MPHR: 157 {beats}/min
Nuc Stress EF: 73 %
Peak HR: 80 {beats}/min
Percent HR: 50 %
Rest HR: 55 {beats}/min
Rest Nuclear Isotope Dose: 10.8 mCi
SDS: 3
SRS: 0
SSS: 3
ST Depression (mm): 0 mm
Stress Nuclear Isotope Dose: 30.2 mCi
TID: 1.11

## 2022-09-27 MED ORDER — TECHNETIUM TC 99M TETROFOSMIN IV KIT
30.2000 | PACK | Freq: Once | INTRAVENOUS | Status: AC | PRN
  Administered 2022-09-27: 30.2 via INTRAVENOUS

## 2022-09-27 MED ORDER — REGADENOSON 0.4 MG/5ML IV SOLN
0.4000 mg | Freq: Once | INTRAVENOUS | Status: AC
  Administered 2022-09-27: 0.4 mg via INTRAVENOUS

## 2022-09-27 MED ORDER — TECHNETIUM TC 99M TETROFOSMIN IV KIT
10.8000 | PACK | Freq: Once | INTRAVENOUS | Status: AC | PRN
  Administered 2022-09-27: 10.8 via INTRAVENOUS

## 2022-09-29 ENCOUNTER — Telehealth: Payer: Self-pay | Admitting: Internal Medicine

## 2022-09-29 NOTE — Telephone Encounter (Signed)
Patient called and said that she has taken the [facility] for 2 nights in a row and can not go to sleep.  It is effecting her the opposite way.  Please advise.  [phone].  Patient also does not want to use the nebulizer.  Her insurance will not pay for this machine.  Patient wants to know if she can be put back on the trellegy.

## 2022-10-03 DIAGNOSIS — Z681 Body mass index (BMI) 19 or less, adult: Secondary | ICD-10-CM | POA: Diagnosis not present

## 2022-10-03 DIAGNOSIS — I700 Atherosclerosis of aorta: Secondary | ICD-10-CM | POA: Diagnosis not present

## 2022-10-03 DIAGNOSIS — G4700 Insomnia, unspecified: Secondary | ICD-10-CM | POA: Diagnosis not present

## 2022-10-03 DIAGNOSIS — I10 Essential (primary) hypertension: Secondary | ICD-10-CM | POA: Diagnosis not present

## 2022-10-03 DIAGNOSIS — J449 Chronic obstructive pulmonary disease, unspecified: Secondary | ICD-10-CM | POA: Diagnosis not present

## 2022-10-03 DIAGNOSIS — I70203 Unspecified atherosclerosis of native arteries of extremities, bilateral legs: Secondary | ICD-10-CM | POA: Diagnosis not present

## 2022-10-03 DIAGNOSIS — E039 Hypothyroidism, unspecified: Secondary | ICD-10-CM | POA: Diagnosis not present

## 2022-10-03 DIAGNOSIS — Z008 Encounter for other general examination: Secondary | ICD-10-CM | POA: Diagnosis not present

## 2022-10-03 DIAGNOSIS — E785 Hyperlipidemia, unspecified: Secondary | ICD-10-CM | POA: Diagnosis not present

## 2022-10-03 DIAGNOSIS — G43909 Migraine, unspecified, not intractable, without status migrainosus: Secondary | ICD-10-CM | POA: Diagnosis not present

## 2022-10-03 DIAGNOSIS — F330 Major depressive disorder, recurrent, mild: Secondary | ICD-10-CM | POA: Diagnosis not present

## 2022-10-03 DIAGNOSIS — M1990 Unspecified osteoarthritis, unspecified site: Secondary | ICD-10-CM | POA: Diagnosis not present

## 2022-10-03 DIAGNOSIS — F411 Generalized anxiety disorder: Secondary | ICD-10-CM | POA: Diagnosis not present

## 2022-10-03 DIAGNOSIS — E1151 Type 2 diabetes mellitus with diabetic peripheral angiopathy without gangrene: Secondary | ICD-10-CM | POA: Diagnosis not present

## 2022-10-03 DIAGNOSIS — E1169 Type 2 diabetes mellitus with other specified complication: Secondary | ICD-10-CM | POA: Diagnosis not present

## 2022-10-03 DIAGNOSIS — D696 Thrombocytopenia, unspecified: Secondary | ICD-10-CM | POA: Diagnosis not present

## 2022-10-03 NOTE — Telephone Encounter (Signed)
Pt has been informed to d/c Lunesta. Pt stated that she has tried OTC meds and they didn't work for her.

## 2022-10-03 NOTE — Telephone Encounter (Signed)
Stop Lunesta.  Take over-the-counter sleep aid, Benadryl and/or melatonin.  Question regarding Trelegy or any other medication can wait for PCPs return to work.

## 2022-10-05 ENCOUNTER — Emergency Department (HOSPITAL_COMMUNITY): Payer: No Typology Code available for payment source

## 2022-10-05 ENCOUNTER — Other Ambulatory Visit: Payer: Self-pay

## 2022-10-05 ENCOUNTER — Emergency Department (HOSPITAL_COMMUNITY)
Admission: EM | Admit: 2022-10-05 | Discharge: 2022-10-05 | Disposition: A | Discharge status: Home / Self Care | Payer: No Typology Code available for payment source | Attending: Emergency Medicine | Admitting: Emergency Medicine

## 2022-10-05 DIAGNOSIS — Z79899 Other long term (current) drug therapy: Secondary | ICD-10-CM | POA: Insufficient documentation

## 2022-10-05 DIAGNOSIS — E039 Hypothyroidism, unspecified: Secondary | ICD-10-CM | POA: Diagnosis not present

## 2022-10-05 DIAGNOSIS — J449 Chronic obstructive pulmonary disease, unspecified: Secondary | ICD-10-CM | POA: Diagnosis not present

## 2022-10-05 DIAGNOSIS — R079 Chest pain, unspecified: Secondary | ICD-10-CM | POA: Insufficient documentation

## 2022-10-05 DIAGNOSIS — R55 Syncope and collapse: Secondary | ICD-10-CM | POA: Insufficient documentation

## 2022-10-05 DIAGNOSIS — R001 Bradycardia, unspecified: Secondary | ICD-10-CM | POA: Insufficient documentation

## 2022-10-05 DIAGNOSIS — I10 Essential (primary) hypertension: Secondary | ICD-10-CM | POA: Diagnosis not present

## 2022-10-05 DIAGNOSIS — R0789 Other chest pain: Secondary | ICD-10-CM | POA: Diagnosis not present

## 2022-10-05 DIAGNOSIS — I2510 Atherosclerotic heart disease of native coronary artery without angina pectoris: Secondary | ICD-10-CM | POA: Insufficient documentation

## 2022-10-05 DIAGNOSIS — I959 Hypotension, unspecified: Secondary | ICD-10-CM | POA: Diagnosis not present

## 2022-10-05 DIAGNOSIS — R1110 Vomiting, unspecified: Secondary | ICD-10-CM | POA: Diagnosis not present

## 2022-10-05 DIAGNOSIS — E119 Type 2 diabetes mellitus without complications: Secondary | ICD-10-CM | POA: Diagnosis not present

## 2022-10-05 DIAGNOSIS — R002 Palpitations: Secondary | ICD-10-CM | POA: Diagnosis not present

## 2022-10-05 DIAGNOSIS — R4182 Altered mental status, unspecified: Secondary | ICD-10-CM | POA: Diagnosis not present

## 2022-10-05 DIAGNOSIS — R1111 Vomiting without nausea: Secondary | ICD-10-CM | POA: Diagnosis not present

## 2022-10-05 LAB — COMPREHENSIVE METABOLIC PANEL
ALT: 29 U/L (ref 0–44)
AST: 34 U/L (ref 15–41)
Albumin: 3.8 g/dL (ref 3.5–5.0)
Alkaline Phosphatase: 64 U/L (ref 38–126)
Anion gap: 12 (ref 5–15)
BUN: 10 mg/dL (ref 8–23)
CO2: 23 mmol/L (ref 22–32)
Calcium: 9.3 mg/dL (ref 8.9–10.3)
Chloride: 105 mmol/L (ref 98–111)
Creatinine, Ser: 0.96 mg/dL (ref 0.44–1.00)
GFR, Estimated: >60 mL/min (ref >60)
Glucose, Bld: 88 mg/dL (ref 70–99)
Potassium: 3.8 mmol/L (ref 3.5–5.1)
Sodium: 140 mmol/L (ref 135–145)
Total Bilirubin: 0.5 mg/dL (ref 0.3–1.2)
Total Protein: 6.3 g/dL — ABNORMAL LOW (ref 6.5–8.1)

## 2022-10-05 LAB — MAGNESIUM: Magnesium: 1.9 mg/dL (ref 1.7–2.4)

## 2022-10-05 LAB — CBC WITH DIFFERENTIAL/PLATELET
Abs Immature Granulocytes: 0.01 10*3/uL (ref 0.00–0.07)
Basophils Absolute: 0 10*3/uL (ref 0.0–0.1)
Basophils Relative: 1 %
Eosinophils Absolute: 0 10*3/uL (ref 0.0–0.5)
Eosinophils Relative: 0 %
HCT: 43.7 % (ref 36.0–46.0)
Hemoglobin: 14.3 g/dL (ref 12.0–15.0)
Immature Granulocytes: 0 %
Lymphocytes Relative: 42 %
Lymphs Abs: 3.1 10*3/uL (ref 0.7–4.0)
MCH: 31 pg (ref 26.0–34.0)
MCHC: 32.7 g/dL (ref 30.0–36.0)
MCV: 94.6 fL (ref 80.0–100.0)
Monocytes Absolute: 0.4 10*3/uL (ref 0.1–1.0)
Monocytes Relative: 5 %
Neutro Abs: 3.9 10*3/uL (ref 1.7–7.7)
Neutrophils Relative %: 52 %
Platelets: 133 10*3/uL — ABNORMAL LOW (ref 150–400)
RBC: 4.62 MIL/uL (ref 3.87–5.11)
RDW: 12.6 % (ref 11.5–15.5)
WBC: 7.4 10*3/uL (ref 4.0–10.5)
nRBC: 0 % (ref 0.0–0.2)

## 2022-10-05 LAB — TSH: TSH: 0.684 u[IU]/mL (ref 0.350–4.500)

## 2022-10-05 LAB — URINALYSIS, ROUTINE W REFLEX MICROSCOPIC
Bilirubin Urine: NEGATIVE
Glucose, UA: NEGATIVE mg/dL
Hgb urine dipstick: NEGATIVE
Ketones, ur: NEGATIVE mg/dL
Leukocytes,Ua: NEGATIVE
Nitrite: NEGATIVE
Protein, ur: NEGATIVE mg/dL
Specific Gravity, Urine: 1.01 (ref 1.005–1.030)
pH: 7.5 (ref 5.0–8.0)

## 2022-10-05 LAB — LIPASE, BLOOD: Lipase: 30 U/L (ref 11–51)

## 2022-10-05 LAB — TROPONIN I (HIGH SENSITIVITY)
Troponin I (High Sensitivity): 3 ng/L (ref <18)
Troponin I (High Sensitivity): 3 ng/L (ref <18)

## 2022-10-05 LAB — T4, FREE: Free T4: 1.04 ng/dL (ref 0.61–1.12)

## 2022-10-05 LAB — PHOSPHORUS: Phosphorus: 2.4 mg/dL — ABNORMAL LOW (ref 2.5–4.6)

## 2022-10-05 LAB — D-DIMER, QUANTITATIVE: D-Dimer, Quant: 0.42 ug{FEU}/mL (ref 0.00–0.50)

## 2022-10-05 MED ORDER — LACTATED RINGERS IV BOLUS
1000.0000 mL | Freq: Once | INTRAVENOUS | Status: AC
  Administered 2022-10-05: 1000 mL via INTRAVENOUS

## 2022-10-05 MED ORDER — LORAZEPAM 2 MG/ML IJ SOLN
0.5000 mg | Freq: Once | INTRAMUSCULAR | Status: AC
  Administered 2022-10-05: 0.5 mg via INTRAVENOUS
  Filled 2022-10-05: qty 1

## 2022-10-05 NOTE — Discharge Instructions (Signed)
Return for any problem.  ?

## 2022-10-05 NOTE — ED Provider Notes (Signed)
Patient seen after prior EDP.  Patient feels improved.  Patient desires discharge home.    Workup in the ED is without evidence of significant acute pathology.    Patient offered additional ED observation and/or admission for continued observation and continued workup.  Patient declines.  She feels comfortable with discharge.  Both she and her husband understand need for close outpatient follow-up.  Strict return precautions given understood.       Wynetta Fines, MD 10/05/22 [phone]

## 2022-10-05 NOTE — ED Provider Notes (Signed)
EMERGENCY DEPARTMENT AT [facility] Provider Note   CSN: [id number] Arrival date & time: 10/05/22  0532     History  Chief Complaint  Patient presents with   Chest Pain    Tasha [name] is a 62 y.o. female.  History obtained from patient, husband and daughter at bedside along with review of recent medical chart in epic.  62 year old female that presents the ER today for syncopal episode.  Patient has multiple medical problems some of which have changed throughout the last year.  Sounds like a while ago patient had some diabetes, hypothyroidism, hypertension, overweight, indigestion, anxiety, hyperlipidemia, COPD.  It sounds like the patient had been getting her Synthroid down tried traded over the last year secondary to low TSHs.  She was started on Mounjaro earlier this year as well and had a 52 pound weight loss since that time.  Since then she has come off of her hypertension medications which seems like an ACE inhibitor likely.  She is also reduced her indigestion medications secondary to the weight loss as well.  She is reduced her smoking recently as well.  She has had some cardiac workups secondary to some coronary artery disease noted on a CT scan in February.  She had a Myoview stress done in the last couple weeks that was reportedly within normal limits.  Patient has had some constipation recently as well and had to disimpact herself.  She has been taking stool softeners.  States that she oftentimes can go a day or 2 without eating and not even realize it.  She states that she feels that she has been staying up with her fluid intake.  No recent illnesses. She is coming up on the anniversary of her father's death and this causes her some sadness.  Over the last couple days she has felt unwell.  She states that she has been checking her blood pressures and heart rates and she shows me the log and she will have intermittent episodes of 80s over 40s but then she also  have some that are in the 1 teens but still with relatively low diastolic numbers.  She states tonight it got even lower and she felt worse so she was coming to the hospital.  On the way to the hospital the patient felt either hard or fast beating in her chest associated with discomfort and lightheadedness for about a minute and then ultimately syncopized for about a minute.  The husband wave down EMS.  When the patient awoke she started throwing up and then continue to throw up while she is with EMS.  She was given some Zofran and fluids with them at this time she does not feel nauseous and her blood pressures are improved.  She states she did not take her Xanax last night secondary to her low blood pressure and did not want to cause any issues with that.  She states that she is feeling a little bit anxious at this time.   Chest Pain      Home Medications Prior to Admission medications   Medication Sig Start Date End Date Taking? Authorizing Provider  albuterol (VENTOLIN HFA) 108 (90 Base) MCG/ACT inhaler Inhale 2 puffs into the lungs every 6 (six) hours as needed for wheezing or shortness of breath 02/03/22   Etta Grandchild, MD  ALPRAZolam Prudy Feeler) 1 MG tablet Take 1 tablet (1 mg) by mouth 3 times daily as needed for anxiety. 09/15/22 03/14/23  Etta Grandchild,  MD  buPROPion (WELLBUTRIN XL) 150 MG 24 hr tablet Take 1 tablet (150 mg total) by mouth daily. 04/14/22   Etta Grandchild, MD  celecoxib (CELEBREX) 100 MG capsule     [provider]  eszopiclone (LUNESTA) 2 MG TABS tablet Take 1 tablet (2 mg total) by mouth at bedtime as needed for sleep. Take immediately before bedtime 09/21/22   Etta Grandchild, MD  ezetimibe (ZETIA) 10 MG tablet Take 1 tablet (10 mg total) by mouth daily. 12/26/21   Etta Grandchild, MD  levothyroxine (SYNTHROID) 75 MCG tablet Take 1 tablet (75 mcg total) by mouth daily. 09/21/22   Etta Grandchild, MD  omega-3 acid ethyl esters (LOVAZA) 1 g capsule Take 2 capsules by  mouth 2 (two) times daily.    [provider]  potassium chloride SA (KLOR-CON M) 20 MEQ tablet Take 1 tablet (20 mEq total) by mouth 2 (two) times daily. 06/03/22   Etta Grandchild, MD  pravastatin (PRAVACHOL) 20 MG tablet Take 1 tablet (20 mg total) by mouth daily. 09/21/22   Etta Grandchild, MD  revefenacin (YUPELRI) 175 MCG/3ML nebulizer solution Take 3 mLs (175 mcg total) by nebulization daily. 09/21/22   Etta Grandchild, MD  sertraline (ZOLOFT) 100 MG tablet Take 1 tablet (100 mg total) by mouth daily. 06/21/22   Etta Grandchild, MD  tirzepatide [facility]) 7.5 MG/0.5ML Pen Inject 7.5 mg into the skin once a week. 08/30/22   Etta Grandchild, MD  ARIPiprazole (ABILIFY) 2 MG tablet Take 1 tablet (2 mg total) by mouth daily. 10/10/19 02/27/20  Etta Grandchild, MD  divalproex (DEPAKOTE) 500 MG DR tablet Take 1 tablet (500 mg total) by mouth 2 (two) times daily. 10/10/19 02/27/20  Etta Grandchild, MD      Allergies    Marcelline Deist [dapagliflozin], Metformin and related, Semaglutide, Semaglutide(0.25 or 0.5mg -dos), Statins, Augmentin [amoxicillin-pot clavulanate], Crestor [rosuvastatin], Lipitor [atorvastatin], Lisinopril, Zocor [simvastatin], and Other    Review of Systems   Review of Systems  Cardiovascular:  Positive for chest pain.    Physical Exam Updated Vital Signs BP (!) 137/58   Pulse (!) 58   Temp 97.9 F (36.6 C)   Resp 14   SpO2 100%  Physical Exam Vitals and nursing note reviewed.  Constitutional:      Appearance: She is well-developed.  HENT:     Head: Normocephalic and atraumatic.  Cardiovascular:     Rate and Rhythm: Regular rhythm. Bradycardia present.     Heart sounds: No murmur heard. Pulmonary:     Effort: No tachypnea, accessory muscle usage or respiratory distress.     Breath sounds: No stridor. Decreased breath sounds present. No wheezing or rhonchi.  Chest:     Chest wall: No mass.  Abdominal:     General: There is no distension.     Palpations: There is no  mass.  Musculoskeletal:     Cervical back: Normal range of motion.     Right lower leg: No edema.     Left lower leg: No edema.  Skin:    General: Skin is warm and dry.     Comments: Tanned - uses tanning bed.  Neurological:     Mental Status: She is alert.     ED Results / Procedures / Treatments   Labs (all labs ordered are listed, but only abnormal results are displayed) Labs Reviewed  CBC WITH DIFFERENTIAL/PLATELET - Abnormal; Notable for the following components:  Result Value   Platelets 133 (*)    All other components within normal limits  COMPREHENSIVE METABOLIC PANEL - Abnormal; Notable for the following components:   Total Protein 6.3 (*)    All other components within normal limits  D-DIMER, QUANTITATIVE  MAGNESIUM  PHOSPHORUS  LIPASE, BLOOD  T4, FREE  TSH  URINALYSIS, ROUTINE W REFLEX MICROSCOPIC  TROPONIN I (HIGH SENSITIVITY)  TROPONIN I (HIGH SENSITIVITY)    EKG None  Radiology No results found.  Procedures Procedures    Medications Ordered in ED Medications  lactated ringers bolus 1,000 mL (0 mLs Intravenous Paused 10/05/22 0722)  LORazepam (ATIVAN) injection 0.5 mg (0.5 mg Intravenous Given 10/05/22 0720)    ED Course/ Medical Decision Making/ A&P                                 Medical Decision Making Amount and/or Complexity of Data Reviewed Labs: ordered. Radiology: ordered.  Ultimately I think the patient syncopized today because of dehydration.  She is probably dehydrated because of the functional hyperthyroidism, decreased intake from the [facility] and weight loss.  However she does have a couple symptoms that bother me as far as the palpitations that happen prior to passing out.  The waking up throwing up, The calcifications seen on her CT scan previously and the fact that her heart rate is actually 50-70 which would speak against dehydration.  Her EKG here is okay without evidence of ischemia.  Secondary to the complicated last 6  months of her medical history I feel like a broad workup is needed.  She states has a history of TIA as well also will do CT scan, delta troponins, and electrolytes, thyroid studies and a urinalysis.  Will go ahead and start hydrating her.  If there is any kind of abnormalities that are even slightly concerning on her workup or any hesitancy on her part to be discharged I think it is reasonable to observe her on telemetry for any kind of arrhythmias and possibly get an echocardiogram while she is here.  Care transferred pending continued workup and observation.   Final Clinical Impression(s) / ED Diagnoses Final diagnoses:  None    Rx / DC Orders ED Discharge Orders     None         Chiante Peden, Barbara Cower, MD 10/05/22 [phone]

## 2022-10-05 NOTE — ED Triage Notes (Signed)
Pt BIB by EMS for N/V and x1 syncopal episode. Husband en route to the hospital when he flagged EMS due to pt passing out in the car and vomiting. Originally coming to the ED due to hypertension and generally feeling unwell x3 days. VSS. Pt complaining of 5/10 CP

## 2022-10-05 NOTE — ED Notes (Signed)
Pt is a&ox4, pwd. Pt disconnected from monitor/vitals so that she could ambulate to restroom. Pt currently has no complaints.

## 2022-10-07 DIAGNOSIS — I959 Hypotension, unspecified: Secondary | ICD-10-CM | POA: Diagnosis not present

## 2022-10-07 DIAGNOSIS — Z008 Encounter for other general examination: Secondary | ICD-10-CM | POA: Diagnosis not present

## 2022-10-10 ENCOUNTER — Telehealth: Payer: Self-pay | Admitting: Internal Medicine

## 2022-10-10 NOTE — Telephone Encounter (Signed)
Requests TOC from [name] to [name]

## 2022-10-11 ENCOUNTER — Other Ambulatory Visit: Payer: Self-pay

## 2022-10-11 ENCOUNTER — Telehealth: Payer: Self-pay

## 2022-10-11 ENCOUNTER — Other Ambulatory Visit (HOSPITAL_COMMUNITY): Payer: Self-pay

## 2022-10-11 DIAGNOSIS — E039 Hypothyroidism, unspecified: Secondary | ICD-10-CM | POA: Diagnosis not present

## 2022-10-11 DIAGNOSIS — Z7985 Long-term (current) use of injectable non-insulin antidiabetic drugs: Secondary | ICD-10-CM | POA: Diagnosis not present

## 2022-10-11 DIAGNOSIS — E782 Mixed hyperlipidemia: Secondary | ICD-10-CM | POA: Diagnosis not present

## 2022-10-11 DIAGNOSIS — E1165 Type 2 diabetes mellitus with hyperglycemia: Secondary | ICD-10-CM | POA: Diagnosis not present

## 2022-10-11 MED ORDER — LEVOTHYROXINE SODIUM 75 MCG PO TABS
75.0000 ug | ORAL_TABLET | Freq: Every morning | ORAL | 3 refills | Status: DC
  Filled 2022-10-11 – 2022-12-23 (×3): qty 90, 90d supply, fill #0
  Filled 2023-03-27: qty 90, 90d supply, fill #1

## 2022-10-11 MED ORDER — MOUNJARO 5 MG/0.5ML ~~LOC~~ SOAJ
5.0000 mg | SUBCUTANEOUS | 1 refills | Status: DC
  Filled 2022-10-11: qty 2, 28d supply, fill #0

## 2022-10-11 MED ORDER — PRAVASTATIN SODIUM 20 MG PO TABS
20.0000 mg | ORAL_TABLET | Freq: Every day | ORAL | 1 refills | Status: DC
  Filled 2022-10-11 – 2022-12-21 (×2): qty 90, 90d supply, fill #0
  Filled 2023-03-27: qty 90, 90d supply, fill #1

## 2022-10-11 MED ORDER — CONTOUR NEXT TEST VI STRP
ORAL_STRIP | 3 refills | Status: AC
  Filled 2022-10-11: qty 100, 100d supply, fill #0
  Filled 2023-05-28: qty 100, 100d supply, fill #1

## 2022-10-11 MED ORDER — MICROLET LANCETS MISC
3 refills | Status: AC
  Filled 2022-10-11: qty 100, 100d supply, fill #0
  Filled 2023-05-28: qty 100, 100d supply, fill #1

## 2022-10-12 NOTE — Progress Notes (Signed)
   10/12/2022  Patient ID: Layne Benton, female   DOB: January 13, 1961, 62 y.o.   MRN: [id number]  Incoming call from patient regarding affordability of Mounjaro, as her [employer] for T2DM medications has no balance remaining.    -Patient has 1 month supply on hand of Mounjaro 7.5mg   -She is unable to tolerate Rybelsus or Ozempic, which are the only other GLP1 medications with PAP programs she would qualify for -Encourage patient to see what copay will be when Greggory Keen is due to refill, as she could have come out of the donut hole -Enrolled her for notification when T2DM disease fund reopens for enrollment through [facility]  Lenna Gilford, PharmD, DPLA

## 2022-10-14 ENCOUNTER — Other Ambulatory Visit (HOSPITAL_COMMUNITY): Payer: Self-pay

## 2022-10-23 ENCOUNTER — Other Ambulatory Visit (HOSPITAL_COMMUNITY): Payer: Self-pay

## 2022-10-25 ENCOUNTER — Telehealth: Payer: Self-pay | Admitting: Internal Medicine

## 2022-10-25 NOTE — Telephone Encounter (Signed)
Pharmacy stating they need chart notes for Rx evefenacin (YUPELRI) 175 MCG/3ML nebulizer solution

## 2022-10-26 ENCOUNTER — Encounter: Payer: Self-pay | Admitting: Internal Medicine

## 2022-10-26 ENCOUNTER — Ambulatory Visit: Payer: No Typology Code available for payment source | Admitting: Internal Medicine

## 2022-10-26 VITALS — BP 110/70 | HR 65 | Temp 98.0°F | Ht 67.0 in | Wt 125.7 lb

## 2022-10-26 DIAGNOSIS — Z23 Encounter for immunization: Secondary | ICD-10-CM | POA: Diagnosis not present

## 2022-10-26 DIAGNOSIS — I152 Hypertension secondary to endocrine disorders: Secondary | ICD-10-CM | POA: Diagnosis not present

## 2022-10-26 DIAGNOSIS — E1159 Type 2 diabetes mellitus with other circulatory complications: Secondary | ICD-10-CM

## 2022-10-26 DIAGNOSIS — M542 Cervicalgia: Secondary | ICD-10-CM

## 2022-10-26 DIAGNOSIS — E785 Hyperlipidemia, unspecified: Secondary | ICD-10-CM | POA: Diagnosis not present

## 2022-10-26 DIAGNOSIS — E039 Hypothyroidism, unspecified: Secondary | ICD-10-CM

## 2022-10-26 DIAGNOSIS — Z7985 Long-term (current) use of injectable non-insulin antidiabetic drugs: Secondary | ICD-10-CM | POA: Diagnosis not present

## 2022-10-26 DIAGNOSIS — E118 Type 2 diabetes mellitus with unspecified complications: Secondary | ICD-10-CM

## 2022-10-26 LAB — CBC WITH DIFFERENTIAL/PLATELET
Basophils Absolute: 0 10*3/uL (ref 0.0–0.1)
Basophils Relative: 0.4 % (ref 0.0–3.0)
Eosinophils Absolute: 0 10*3/uL (ref 0.0–0.7)
Eosinophils Relative: 0.1 % (ref 0.0–5.0)
HCT: 43.3 % (ref 36.0–46.0)
Hemoglobin: 14.2 g/dL (ref 12.0–15.0)
Lymphocytes Relative: 43.4 % (ref 12.0–46.0)
Lymphs Abs: 3 10*3/uL (ref 0.7–4.0)
MCHC: 32.7 g/dL (ref 30.0–36.0)
MCV: 96.5 fl (ref 78.0–100.0)
Monocytes Absolute: 0.3 10*3/uL (ref 0.1–1.0)
Monocytes Relative: 5 % (ref 3.0–12.0)
Neutro Abs: 3.5 10*3/uL (ref 1.4–7.7)
Neutrophils Relative %: 51.1 % (ref 43.0–77.0)
Platelets: 146 10*3/uL — ABNORMAL LOW (ref 150.0–400.0)
RBC: 4.49 Mil/uL (ref 3.87–5.11)
RDW: 13.7 % (ref 11.5–15.5)
WBC: 7 10*3/uL (ref 4.0–10.5)

## 2022-10-26 LAB — LIPID PANEL
Cholesterol: 147 mg/dL (ref 0–200)
HDL: 52.1 mg/dL (ref >39.00)
LDL Cholesterol: 80 mg/dL (ref 0–99)
NonHDL: 95.13
Total CHOL/HDL Ratio: 3
Triglycerides: 75 mg/dL (ref 0.0–149.0)
VLDL: 15 mg/dL (ref 0.0–40.0)

## 2022-10-26 LAB — COMPREHENSIVE METABOLIC PANEL
ALT: 22 U/L (ref 0–35)
AST: 19 U/L (ref 0–37)
Albumin: 4.3 g/dL (ref 3.5–5.2)
Alkaline Phosphatase: 88 U/L (ref 39–117)
BUN: 11 mg/dL (ref 6–23)
CO2: 29 mEq/L (ref 19–32)
Calcium: 9.5 mg/dL (ref 8.4–10.5)
Chloride: 103 mEq/L (ref 96–112)
Creatinine, Ser: 0.81 mg/dL (ref 0.40–1.20)
GFR: 77.17 mL/min (ref >60.00)
Glucose, Bld: 85 mg/dL (ref 70–99)
Potassium: 4.2 mEq/L (ref 3.5–5.1)
Sodium: 139 mEq/L (ref 135–145)
Total Bilirubin: 0.6 mg/dL (ref 0.2–1.2)
Total Protein: 7 g/dL (ref 6.0–8.3)

## 2022-10-26 LAB — TSH: TSH: 1.76 u[IU]/mL (ref 0.35–5.50)

## 2022-10-26 NOTE — Addendum Note (Signed)
Addended by: Kern Reap B on: 10/26/2022 03:25 PM   Modules accepted: Orders

## 2022-10-26 NOTE — Telephone Encounter (Signed)
Office notes has been faxed to [facility] regarding Rx evefenacin (YUPELRI) 175 MCG/3ML nebulizer solution

## 2022-10-26 NOTE — Progress Notes (Signed)
Established Patient Office Visit     CC/Reason for Visit: Establish care, discuss chronic medical conditions  HPI: Tasha [name] is a 62 y.o. female who is coming in today for the above mentioned reasons. Past Medical History is significant for: Hypertension, hyperlipidemia, type 2 diabetes, hypothyroidism, depression, anxiety and insomnia.  She follows with endocrinologist at [facility].  She started [facility] earlier this year and has lost 55 pounds.  With this her medication requirements have decreased.  At her last check her TSH was over suppressed and her blood pressure has been low so she has completely discontinued antihypertensives.  Requesting flu vaccine.  She is overdue for 3-year colonoscopy.   Past Medical/Surgical History: Past Medical History:  Diagnosis Date   Allergy    Anemia    past hx    Anxiety state, unspecified    Arthralgia of temporomandibular joint    Arthritis    Asthma    as a child , not now per pt    Conversion disorder    Dementia (HCC)    Depressive disorder, not elsewhere classified    Diabetes mellitus without complication (HCC)    off all medicines as of 04-16-2019 PV    Dysphagia, unspecified(787.20)    Endometriosis    Esophageal reflux    Fibromyalgia    Headache(784.0)    Heart murmur    past hx murmur    Hx of adenomatous colonic polyps    Hypertension 07/18/2020   Irritable bowel syndrome    Mitral valve disorders(424.0)    Myalgia and myositis, unspecified    Overweight(278.02)    Periodic limb movement disorder    Pure hypercholesterolemia    RLS (restless legs syndrome)    Seizures (HCC)    last episode 5 yrs ago    Stroke [facility])    TIA per pt many years ago    Type II diabetes mellitus with manifestations (HCC) 03/16/2022   Unspecified hypothyroidism     Past Surgical History:  Procedure Laterality Date   arm surgery     COLONOSCOPY  04/14/2008   FOOT SURGERY     LIPOMA EXCISION     BESIDES BELLY BUTTON    POLYPECTOMY     HPP x 1   TONSILLECTOMY     TOTAL ABDOMINAL HYSTERECTOMY     TAH   UPPER GASTROINTESTINAL ENDOSCOPY  2010   [facility]ner    WRIST SURGERY      Social History:  reports that she has been smoking cigarettes. She has a 10.8 pack-year smoking history. She has never used smokeless tobacco. She reports current alcohol use. She reports that she does not use drugs.  Allergies: Allergies  Allergen Reactions   Farxiga [Dapagliflozin] Other (See Comments)    Vag yeast inf   Metformin And Related Diarrhea   Semaglutide Nausea And Vomiting and Other (See Comments)   Semaglutide(0.25 Or 0.5mg -Dos) Nausea And Vomiting   Statins Other (See Comments)    Muscle weakness/pains   Augmentin [Amoxicillin-Pot Clavulanate] Other (See Comments)    Patient states she recently reported to dentist and was advised to report to providers as allergy.  States she got blisters under her eyes after taking Augmentin.   Crestor [Rosuvastatin] Other (See Comments)    Muscle aches   Lipitor [Atorvastatin] Other (See Comments)    Muscle aches   Lisinopril Cough   Zocor [Simvastatin] Other (See Comments)    Leg pain per patient/PE   Other Other (See Comments)  Family History:  Family History  Problem Relation Age of Onset   Heart disease Mother    Heart disease Father    Diabetes Father    Colon cancer Father 52       died age 34 from colon cancer spreading per pt    Breast cancer Maternal Aunt    Colon cancer Paternal Aunt    Stomach cancer Paternal Aunt    Lung cancer Paternal Aunt    Rectal cancer Paternal Aunt    Stomach cancer Paternal Uncle    Colon polyps Neg Hx    Esophageal cancer Neg Hx      Current Outpatient Medications:    albuterol (VENTOLIN HFA) 108 (90 Base) MCG/ACT inhaler, Inhale 2 puffs into the lungs every 6 (six) hours as needed for wheezing or shortness of breath, Disp: 6.7 g, Rfl: 3   ALPRAZolam (XANAX) 1 MG tablet, Take 1 tablet (1 mg) by mouth 3 times daily as  needed for anxiety., Disp: 90 tablet, Rfl: 1   buPROPion (WELLBUTRIN XL) 150 MG 24 hr tablet, Take 1 tablet (150 mg total) by mouth daily., Disp: 90 tablet, Rfl: 1   celecoxib (CELEBREX) 100 MG capsule, , Disp: , Rfl:    eszopiclone (LUNESTA) 2 MG TABS tablet, Take 1 tablet (2 mg total) by mouth at bedtime as needed for sleep. Take immediately before bedtime, Disp: 90 tablet, Rfl: 0   ezetimibe (ZETIA) 10 MG tablet, Take 1 tablet (10 mg total) by mouth daily., Disp: 90 tablet, Rfl: 0   glucose blood (CONTOUR NEXT TEST) test strip, Use to test blood sugar daily as directed, Disp: 100 each, Rfl: 3   levothyroxine (SYNTHROID) 75 MCG tablet, Take 1 tablet (75 mcg total) by mouth every morning., Disp: 90 tablet, Rfl: 3   Microlet Lancets MISC, Use to test blood sugar daily as directed, Disp: 100 each, Rfl: 3   potassium chloride SA (KLOR-CON M) 20 MEQ tablet, Take 1 tablet (20 mEq total) by mouth 2 (two) times daily., Disp: 180 tablet, Rfl: 0   pravastatin (PRAVACHOL) 20 MG tablet, Take 1 tablet (20 mg total) by mouth daily., Disp: 90 tablet, Rfl: 1   sertraline (ZOLOFT) 100 MG tablet, Take 1 tablet (100 mg total) by mouth daily., Disp: 90 tablet, Rfl: 1   tirzepatide (MOUNJARO) 5 MG/0.5ML Pen, Inject 5 mg into the skin every 7 (seven) days., Disp: 6 mL, Rfl: 1   tirzepatide (MOUNJARO) 7.5 MG/0.5ML Pen, Inject 7.5 mg into the skin once a week., Disp: 6 mL, Rfl: 0  Review of Systems:  Negative unless indicated in HPI.   Physical Exam: Vitals:   10/26/22 1426  BP: 110/70  Pulse: 65  Temp: 98 F (36.7 C)  TempSrc: Oral  SpO2: 98%  Weight: 125 lb 11.2 oz (57 kg)  Height: 5\' 7"  (1.702 m)    Body mass index is 19.69 kg/m.   Physical Exam Vitals reviewed.  Constitutional:      Appearance: Normal appearance.  HENT:     Head: Normocephalic and atraumatic.  Eyes:     Conjunctiva/sclera: Conjunctivae normal.     Pupils: Pupils are equal, round, and reactive to light.  Cardiovascular:      Rate and Rhythm: Normal rate and regular rhythm.  Pulmonary:     Effort: Pulmonary effort is normal.     Breath sounds: Normal breath sounds.  Skin:    General: Skin is warm and dry.  Neurological:     General: No focal deficit  present.     Mental Status: She is alert and oriented to person, place, and time.  Psychiatric:        Mood and Affect: Mood normal.        Behavior: Behavior normal.        Thought Content: Thought content normal.        Judgment: Judgment normal.     Impression and Plan:  Acquired hypothyroidism -     TSH; Future  Hyperlipidemia with target LDL less than 130 -     Lipid panel; Future  Hypertension associated with diabetes (HCC) -     Comprehensive metabolic panel; Future -     CBC with Differential/Platelet; Future  Type II diabetes mellitus with manifestations (HCC)   -Blood pressure is well-controlled off all medication. -Recent A1c of 5.4 demonstrates very well-controlled diabetes, she is followed by endocrinology and was recommended a dose reduction of Mounjaro from 7.5 to 5 mg. -Check lipids today, check TSH today. -Flu vaccine administered today. -She had a colonoscopy in 2021 and was advised 3-year follow-up, have advised her to call GI office to schedule. -Has appointment with new gynecologist scheduled for November.   Time spent:33 minutes reviewing chart, interviewing and examining patient and formulating plan of care.     Chaya Jan, MD [facility] at [facility]

## 2022-10-31 ENCOUNTER — Encounter: Payer: No Typology Code available for payment source | Admitting: Obstetrics and Gynecology

## 2022-11-14 ENCOUNTER — Other Ambulatory Visit (HOSPITAL_COMMUNITY): Payer: Self-pay

## 2022-11-15 DIAGNOSIS — E039 Hypothyroidism, unspecified: Secondary | ICD-10-CM | POA: Diagnosis not present

## 2022-11-17 ENCOUNTER — Other Ambulatory Visit: Payer: No Typology Code available for payment source

## 2022-11-20 ENCOUNTER — Other Ambulatory Visit (HOSPITAL_COMMUNITY): Payer: Self-pay

## 2022-11-20 ENCOUNTER — Other Ambulatory Visit: Payer: Self-pay | Admitting: Internal Medicine

## 2022-11-20 DIAGNOSIS — F411 Generalized anxiety disorder: Secondary | ICD-10-CM

## 2022-11-21 ENCOUNTER — Other Ambulatory Visit (HOSPITAL_COMMUNITY): Payer: Self-pay

## 2022-11-21 MED ORDER — ALPRAZOLAM 1 MG PO TABS
1.0000 mg | ORAL_TABLET | Freq: Three times a day (TID) | ORAL | 1 refills | Status: DC | PRN
Start: 2022-11-21 — End: 2023-01-24
  Filled 2022-11-21: qty 90, 30d supply, fill #0
  Filled 2022-12-21: qty 90, 30d supply, fill #1

## 2022-11-22 ENCOUNTER — Other Ambulatory Visit: Payer: Self-pay | Admitting: *Deleted

## 2022-11-24 ENCOUNTER — Other Ambulatory Visit: Payer: No Typology Code available for payment source

## 2022-11-28 ENCOUNTER — Encounter: Payer: Self-pay | Admitting: Optometry

## 2022-11-28 DIAGNOSIS — H524 Presbyopia: Secondary | ICD-10-CM | POA: Diagnosis not present

## 2022-11-28 DIAGNOSIS — H52223 Regular astigmatism, bilateral: Secondary | ICD-10-CM | POA: Diagnosis not present

## 2022-11-28 DIAGNOSIS — H2513 Age-related nuclear cataract, bilateral: Secondary | ICD-10-CM | POA: Diagnosis not present

## 2022-11-28 DIAGNOSIS — H5213 Myopia, bilateral: Secondary | ICD-10-CM | POA: Diagnosis not present

## 2022-11-28 DIAGNOSIS — H04123 Dry eye syndrome of bilateral lacrimal glands: Secondary | ICD-10-CM | POA: Diagnosis not present

## 2022-11-28 DIAGNOSIS — E119 Type 2 diabetes mellitus without complications: Secondary | ICD-10-CM | POA: Diagnosis not present

## 2022-11-28 LAB — HM DIABETES EYE EXAM

## 2022-11-29 ENCOUNTER — Telehealth: Payer: Self-pay | Admitting: Internal Medicine

## 2022-11-29 DIAGNOSIS — L659 Nonscarring hair loss, unspecified: Secondary | ICD-10-CM

## 2022-11-29 NOTE — Telephone Encounter (Signed)
Patient is aware 

## 2022-11-29 NOTE — Telephone Encounter (Signed)
Pt says she is losing her hair quickly and in large amounts. Says at the rate it's shedding she feels she will be bald in a few months. Asking if you think she needs a specialist and if so requests a referral.

## 2022-11-29 NOTE — Telephone Encounter (Signed)
Referral placed. Left message on machine for patient to return our call

## 2022-11-30 ENCOUNTER — Other Ambulatory Visit: Payer: Self-pay | Admitting: Internal Medicine

## 2022-11-30 DIAGNOSIS — E118 Type 2 diabetes mellitus with unspecified complications: Secondary | ICD-10-CM

## 2022-12-01 ENCOUNTER — Other Ambulatory Visit (HOSPITAL_COMMUNITY): Payer: Self-pay

## 2022-12-04 ENCOUNTER — Other Ambulatory Visit (HOSPITAL_COMMUNITY): Payer: Self-pay

## 2022-12-06 ENCOUNTER — Other Ambulatory Visit: Payer: Self-pay | Admitting: Internal Medicine

## 2022-12-06 ENCOUNTER — Other Ambulatory Visit: Payer: Self-pay

## 2022-12-06 ENCOUNTER — Other Ambulatory Visit (HOSPITAL_COMMUNITY): Payer: Self-pay

## 2022-12-06 DIAGNOSIS — F411 Generalized anxiety disorder: Secondary | ICD-10-CM

## 2022-12-06 DIAGNOSIS — F333 Major depressive disorder, recurrent, severe with psychotic symptoms: Secondary | ICD-10-CM

## 2022-12-06 MED ORDER — MOUNJARO 5 MG/0.5ML ~~LOC~~ SOAJ
5.0000 mg | SUBCUTANEOUS | 1 refills | Status: DC
  Filled 2022-12-06: qty 6, 84d supply, fill #0

## 2022-12-07 ENCOUNTER — Other Ambulatory Visit: Payer: Self-pay

## 2022-12-07 ENCOUNTER — Other Ambulatory Visit (HOSPITAL_BASED_OUTPATIENT_CLINIC_OR_DEPARTMENT_OTHER): Payer: Self-pay

## 2022-12-07 ENCOUNTER — Other Ambulatory Visit (HOSPITAL_COMMUNITY): Payer: Self-pay

## 2022-12-12 ENCOUNTER — Other Ambulatory Visit (HOSPITAL_COMMUNITY): Payer: Self-pay

## 2022-12-15 ENCOUNTER — Encounter: Payer: No Typology Code available for payment source | Admitting: Obstetrics and Gynecology

## 2022-12-15 NOTE — Progress Notes (Deleted)
62 y.o. y.o. female here for annual exam. She denies any *** bleeding.   No LMP recorded. Patient has had a hysterectomy.   [facility] for endometriosis  Pelvic discharge: *** Pelvic pain: *** Birth control: *** Last mammogram: *** Last colonoscopy: ***  There were no vitals taken for this visit.  No results found for: "DIAGPAP", "HPVHIGH", "ADEQPAP"  GYN HISTORY: No results found for: "DIAGPAP", "HPVHIGH", "ADEQPAP"  OB History  Gravida Para Term Preterm AB Living  5 2     3 2   SAB IAB Ectopic Multiple Live Births  2   1        # Outcome Date GA Lbr Len/2nd Weight Sex Type Anes PTL Lv  5 Ectopic           4 SAB           3 SAB           2 Para           1 Para             Past Medical History:  Diagnosis Date  . Allergy   . Anemia    past hx   . Anxiety state, unspecified   . Arthralgia of temporomandibular joint   . Arthritis   . Asthma    as a child , not now per pt   . Conversion disorder   . Dementia (HCC)   . Depressive disorder, not elsewhere classified   . Diabetes mellitus without complication (HCC)    off all medicines as of 04-16-2019 PV   . Dysphagia, unspecified(787.20)   . Endometriosis   . Esophageal reflux   . Fibromyalgia   . Headache(784.0)   . Heart murmur    past hx murmur   . Hx of adenomatous colonic polyps   . Hypertension 07/18/2020  . Irritable bowel syndrome   . Mitral valve disorders(424.0)   . Myalgia and myositis, unspecified   . Overweight(278.02)   . Periodic limb movement disorder   . Pure hypercholesterolemia   . RLS (restless legs syndrome)   . Seizures (HCC)    last episode 5 yrs ago   . Stroke [facility])    TIA per pt many years ago   . Type II diabetes mellitus with manifestations (HCC) 03/16/2022  . Unspecified hypothyroidism     Past Surgical History:  Procedure Laterality Date  . arm surgery    . COLONOSCOPY  04/14/2008  . FOOT SURGERY    . LIPOMA EXCISION     BESIDES BELLY BUTTON  . POLYPECTOMY     HPP x 1   . TONSILLECTOMY    . TOTAL ABDOMINAL HYSTERECTOMY     TAH  . UPPER GASTROINTESTINAL ENDOSCOPY  2010   [facility]   . WRIST SURGERY      Current Outpatient Medications on File Prior to Visit  Medication Sig Dispense Refill  . albuterol (VENTOLIN HFA) 108 (90 Base) MCG/ACT inhaler Inhale 2 puffs into the lungs every 6 (six) hours as needed for wheezing or shortness of breath 6.7 g 3  . ALPRAZolam (XANAX) 1 MG tablet Take 1 tablet (1 mg) by mouth 3 times daily as needed for anxiety. 90 tablet 1  . buPROPion (WELLBUTRIN XL) 150 MG 24 hr tablet Take 1 tablet (150 mg total) by mouth daily. 90 tablet 1  . celecoxib (CELEBREX) 100 MG capsule     . eszopiclone (LUNESTA) 2 MG TABS tablet Take 1 tablet (2 mg total)  by mouth at bedtime as needed for sleep. Take immediately before bedtime 90 tablet 0  . ezetimibe (ZETIA) 10 MG tablet Take 1 tablet (10 mg total) by mouth daily. 90 tablet 0  . glucose blood (CONTOUR NEXT TEST) test strip Use to test blood sugar daily as directed 100 each 3  . levothyroxine (SYNTHROID) 75 MCG tablet Take 1 tablet (75 mcg total) by mouth every morning. 90 tablet 3  . Microlet Lancets MISC Use to test blood sugar daily as directed 100 each 3  . potassium chloride SA (KLOR-CON M) 20 MEQ tablet Take 1 tablet (20 mEq total) by mouth 2 (two) times daily. 180 tablet 0  . pravastatin (PRAVACHOL) 20 MG tablet Take 1 tablet (20 mg total) by mouth daily. 90 tablet 1  . sertraline (ZOLOFT) 100 MG tablet Take 1 tablet (100 mg total) by mouth daily. 90 tablet 1  . tirzepatide (MOUNJARO) 5 MG/0.5ML Pen Inject 5 mg into the skin every 7 (seven) days. 6 mL 1  . tirzepatide (MOUNJARO) 5 MG/0.5ML Pen Inject 5 mg into the skin every 7 (seven) days. 6 mL 1  . tirzepatide (MOUNJARO) 7.5 MG/0.5ML Pen Inject 7.5 mg into the skin once a week. 6 mL 0  . [DISCONTINUED] ARIPiprazole (ABILIFY) 2 MG tablet Take 1 tablet (2 mg total) by mouth daily. 30 tablet 0  . [DISCONTINUED] divalproex (DEPAKOTE) 500  MG DR tablet Take 1 tablet (500 mg total) by mouth 2 (two) times daily. 60 tablet 4   No current facility-administered medications on file prior to visit.    Social History   Socioeconomic History  . Marital status: Married    Spouse name: Press photographer Terlecki  . Number of children: 2  . Years of education: 61  . Highest education level: Not on file  Occupational History  . Occupation: Disabled    Employer: [employer]  Tobacco Use  . Smoking status: Some Days    Current packs/day: 0.25    Average packs/day: 0.3 packs/day for 43.0 years (10.8 ttl pk-yrs)    Types: Cigarettes  . Smokeless tobacco: Never  . Tobacco comments:    1-2 per day  Vaping Use  . Vaping status: Former  . Substances: Nicotine, Flavoring  Substance and Sexual Activity  . Alcohol use: Yes    Comment: rarely  . Drug use: No  . Sexual activity: Yes  Other Topics Concern  . Not on file  Social History Narrative   Lives w/ husband Jearld Shines Grabinski   Caffeine use: 2 cups coffee per day   Tea-3-4 drinks   Right handed    Social Determinants of Health   Financial Resource Strain: Low Risk  (09/21/2022)   Overall Financial Resource Strain (CARDIA)   . Difficulty of Paying Living Expenses: Not hard at all  Food Insecurity: No Food Insecurity (09/21/2022)   Hunger Vital Sign   . Worried About Programme researcher, broadcasting/film/video in the Last Year: Never true   . Ran Out of Food in the Last Year: Never true  Transportation Needs: No Transportation Needs (09/21/2022)   PRAPARE - Transportation   . Lack of Transportation (Medical): No   . Lack of Transportation (Non-Medical): No  Physical Activity: Inactive (09/21/2022)   Exercise Vital Sign   . Days of Exercise per Week: 0 days   . Minutes of Exercise per Session: 0 min  Stress: No Stress Concern Present (09/21/2022)   [employer] of Occupational Health - Occupational Stress Questionnaire   .  Feeling of Stress : Not at all  Social Connections: Moderately Isolated  (09/21/2022)   Social Connection and Isolation Panel [NHANES]   . Frequency of Communication with Friends and Family: More than three times a week   . Frequency of Social Gatherings with Friends and Family: More than three times a week   . Attends Religious Services: Never   . Active Member of Clubs or Organizations: No   . Attends Banker Meetings: Never   . Marital Status: Married  Catering manager Violence: Not At Risk (09/21/2022)   Humiliation, Afraid, Rape, and Kick questionnaire   . Fear of Current or Ex-Partner: No   . Emotionally Abused: No   . Physically Abused: No   . Sexually Abused: No    Family History  Problem Relation Age of Onset  . Heart disease Mother   . Heart disease Father   . Diabetes Father   . Colon cancer Father 38       died age 79 from colon cancer spreading per pt   . Breast cancer Maternal Aunt   . Colon cancer Paternal Aunt   . Stomach cancer Paternal Aunt   . Lung cancer Paternal Aunt   . Rectal cancer Paternal Aunt   . Stomach cancer Paternal Uncle   . Colon polyps Neg Hx   . Esophageal cancer Neg Hx      Allergies  Allergen Reactions  . Marcelline Deist [Dapagliflozin] Other (See Comments)    Vag yeast inf  . Metformin And Related Diarrhea  . Semaglutide Nausea And Vomiting and Other (See Comments)  . Semaglutide(0.25 Or 0.5mg -Dos) Nausea And Vomiting  . Statins Other (See Comments)    Muscle weakness/pains  . Augmentin [Amoxicillin-Pot Clavulanate] Other (See Comments)    Patient states she recently reported to dentist and was advised to report to providers as allergy.  States she got blisters under her eyes after taking Augmentin.  . Crestor [Rosuvastatin] Other (See Comments)    Muscle aches  . Lipitor [Atorvastatin] Other (See Comments)    Muscle aches  . Lisinopril Cough  . Zocor [Simvastatin] Other (See Comments)    Leg pain per patient/PE  . Other Other (See Comments)      Patient's last menstrual period was No LMP  recorded. Patient has had a hysterectomy.Marland Kitchen          Sexually active: ***  Exercising: ***   Review of Systems Alls systems reviewed and are negative.     OBGyn Exam    A:         Well Woman GYN exam                             P:        Pap smear {Pap indication:31164} Encouraged annual mammogram screening Colon cancer screening {Colon cancer screening:31170} DXA {DXA screening:31171} Labs and immunizations {annual labs:31172} Discussed breast self exams Encouraged healthy lifestyle practices Encouraged Vit D and Calcium   No follow-ups on file.  Earley Favor

## 2022-12-21 ENCOUNTER — Other Ambulatory Visit (HOSPITAL_COMMUNITY): Payer: Self-pay

## 2022-12-21 ENCOUNTER — Other Ambulatory Visit: Payer: Self-pay | Admitting: Internal Medicine

## 2022-12-21 DIAGNOSIS — F411 Generalized anxiety disorder: Secondary | ICD-10-CM

## 2022-12-21 DIAGNOSIS — F333 Major depressive disorder, recurrent, severe with psychotic symptoms: Secondary | ICD-10-CM

## 2022-12-21 MED ORDER — SERTRALINE HCL 100 MG PO TABS
100.0000 mg | ORAL_TABLET | Freq: Every day | ORAL | 1 refills | Status: DC
  Filled 2022-12-21: qty 90, 90d supply, fill #0
  Filled 2023-03-27: qty 90, 90d supply, fill #1

## 2022-12-21 NOTE — Telephone Encounter (Signed)
Historical medication. Okay to refill?

## 2022-12-21 NOTE — Telephone Encounter (Signed)
Pt called to F/U on this Rx refill request sertraline (ZOLOFT) 100 MG tablet

## 2022-12-22 ENCOUNTER — Telehealth: Payer: Self-pay | Admitting: Pharmacist

## 2022-12-22 ENCOUNTER — Other Ambulatory Visit: Payer: Medicare HMO

## 2022-12-22 NOTE — Telephone Encounter (Signed)
Contacted patient for 1 PM clinical pharmacist appt. Calls go straight to voicemail. Left voicemail with return phone number. It appears patient has changed PCPs as well.

## 2022-12-23 ENCOUNTER — Other Ambulatory Visit: Payer: Self-pay | Admitting: Internal Medicine

## 2022-12-23 ENCOUNTER — Other Ambulatory Visit (HOSPITAL_COMMUNITY): Payer: Self-pay

## 2022-12-25 ENCOUNTER — Ambulatory Visit: Payer: No Typology Code available for payment source | Admitting: Internal Medicine

## 2022-12-25 ENCOUNTER — Other Ambulatory Visit (HOSPITAL_COMMUNITY): Payer: Self-pay

## 2022-12-26 ENCOUNTER — Other Ambulatory Visit (HOSPITAL_COMMUNITY): Payer: Self-pay

## 2022-12-26 ENCOUNTER — Other Ambulatory Visit: Payer: Self-pay | Admitting: Internal Medicine

## 2022-12-26 DIAGNOSIS — F333 Major depressive disorder, recurrent, severe with psychotic symptoms: Secondary | ICD-10-CM

## 2022-12-26 MED ORDER — BUPROPION HCL ER (XL) 150 MG PO TB24
150.0000 mg | ORAL_TABLET | Freq: Every day | ORAL | 0 refills | Status: DC
Start: 2022-12-26 — End: 2023-03-27
  Filled 2022-12-26: qty 90, 90d supply, fill #0

## 2022-12-26 MED ORDER — EZETIMIBE 10 MG PO TABS
10.0000 mg | ORAL_TABLET | Freq: Every day | ORAL | 0 refills | Status: DC
  Filled 2022-12-26: qty 90, 90d supply, fill #0

## 2022-12-27 ENCOUNTER — Other Ambulatory Visit (HOSPITAL_COMMUNITY): Payer: Self-pay

## 2023-01-23 ENCOUNTER — Ambulatory Visit: Payer: No Typology Code available for payment source | Admitting: Internal Medicine

## 2023-01-24 ENCOUNTER — Other Ambulatory Visit: Payer: Self-pay | Admitting: Internal Medicine

## 2023-01-24 ENCOUNTER — Other Ambulatory Visit (HOSPITAL_COMMUNITY): Payer: Self-pay

## 2023-01-24 DIAGNOSIS — F411 Generalized anxiety disorder: Secondary | ICD-10-CM

## 2023-01-24 DIAGNOSIS — E118 Type 2 diabetes mellitus with unspecified complications: Secondary | ICD-10-CM

## 2023-01-24 MED ORDER — ALPRAZOLAM 1 MG PO TABS
1.0000 mg | ORAL_TABLET | Freq: Three times a day (TID) | ORAL | 1 refills | Status: DC | PRN
  Filled 2023-01-24: qty 90, 30d supply, fill #0
  Filled 2023-03-03: qty 90, 30d supply, fill #1

## 2023-01-25 ENCOUNTER — Other Ambulatory Visit: Payer: Self-pay

## 2023-01-25 ENCOUNTER — Other Ambulatory Visit (HOSPITAL_COMMUNITY): Payer: Self-pay

## 2023-01-25 MED ORDER — MOUNJARO 7.5 MG/0.5ML ~~LOC~~ SOAJ
7.5000 mg | SUBCUTANEOUS | 0 refills | Status: DC
  Filled 2023-01-25 – 2023-02-03 (×2): qty 6, 84d supply, fill #0

## 2023-01-26 ENCOUNTER — Ambulatory Visit
Admission: RE | Admit: 2023-01-26 | Discharge: 2023-01-26 | Disposition: A | Discharge status: Home / Self Care | Payer: No Typology Code available for payment source | Source: Ambulatory Visit | Attending: Acute Care | Admitting: Acute Care

## 2023-01-26 DIAGNOSIS — Z122 Encounter for screening for malignant neoplasm of respiratory organs: Secondary | ICD-10-CM

## 2023-01-26 DIAGNOSIS — Z87891 Personal history of nicotine dependence: Secondary | ICD-10-CM

## 2023-01-26 DIAGNOSIS — F17210 Nicotine dependence, cigarettes, uncomplicated: Secondary | ICD-10-CM | POA: Diagnosis not present

## 2023-02-03 ENCOUNTER — Other Ambulatory Visit (HOSPITAL_COMMUNITY): Payer: Self-pay

## 2023-02-06 DIAGNOSIS — H5213 Myopia, bilateral: Secondary | ICD-10-CM | POA: Diagnosis not present

## 2023-02-06 DIAGNOSIS — H524 Presbyopia: Secondary | ICD-10-CM | POA: Diagnosis not present

## 2023-02-06 DIAGNOSIS — H52209 Unspecified astigmatism, unspecified eye: Secondary | ICD-10-CM | POA: Diagnosis not present

## 2023-02-09 ENCOUNTER — Telehealth: Payer: Self-pay | Admitting: *Deleted

## 2023-02-09 NOTE — Telephone Encounter (Signed)
 Copied from CRM [id number]. Topic: Clinical - Lab/Test Results >> Feb 09, 2023  2:40 PM Tasha [name] wrote: Reason for CRM: Patient is requesting a callback regarding results from CT Scan of the lung.

## 2023-02-12 ENCOUNTER — Other Ambulatory Visit: Payer: Self-pay

## 2023-02-12 DIAGNOSIS — F17210 Nicotine dependence, cigarettes, uncomplicated: Secondary | ICD-10-CM

## 2023-02-12 DIAGNOSIS — Z122 Encounter for screening for malignant neoplasm of respiratory organs: Secondary | ICD-10-CM

## 2023-02-12 DIAGNOSIS — Z87891 Personal history of nicotine dependence: Secondary | ICD-10-CM

## 2023-02-13 ENCOUNTER — Ambulatory Visit (INDEPENDENT_AMBULATORY_CARE_PROVIDER_SITE_OTHER): Payer: No Typology Code available for payment source | Admitting: Obstetrics and Gynecology

## 2023-02-13 ENCOUNTER — Other Ambulatory Visit (HOSPITAL_COMMUNITY): Payer: Self-pay

## 2023-02-13 ENCOUNTER — Other Ambulatory Visit: Payer: Self-pay | Admitting: Internal Medicine

## 2023-02-13 ENCOUNTER — Encounter: Payer: Self-pay | Admitting: Obstetrics and Gynecology

## 2023-02-13 ENCOUNTER — Other Ambulatory Visit (HOSPITAL_COMMUNITY)
Admission: RE | Admit: 2023-02-13 | Discharge: 2023-02-13 | Disposition: A | Discharge status: Home / Self Care | Payer: No Typology Code available for payment source | Source: Ambulatory Visit | Attending: Obstetrics and Gynecology | Admitting: Obstetrics and Gynecology

## 2023-02-13 VITALS — BP 106/68 | HR 61 | Ht 65.75 in | Wt 127.0 lb

## 2023-02-13 DIAGNOSIS — E2839 Other primary ovarian failure: Secondary | ICD-10-CM

## 2023-02-13 DIAGNOSIS — Z1151 Encounter for screening for human papillomavirus (HPV): Secondary | ICD-10-CM | POA: Diagnosis not present

## 2023-02-13 DIAGNOSIS — N898 Other specified noninflammatory disorders of vagina: Secondary | ICD-10-CM

## 2023-02-13 DIAGNOSIS — Z1211 Encounter for screening for malignant neoplasm of colon: Secondary | ICD-10-CM

## 2023-02-13 DIAGNOSIS — N3945 Continuous leakage: Secondary | ICD-10-CM | POA: Diagnosis not present

## 2023-02-13 DIAGNOSIS — Z01419 Encounter for gynecological examination (general) (routine) without abnormal findings: Secondary | ICD-10-CM

## 2023-02-13 DIAGNOSIS — Z1331 Encounter for screening for depression: Secondary | ICD-10-CM

## 2023-02-13 DIAGNOSIS — E049 Nontoxic goiter, unspecified: Secondary | ICD-10-CM

## 2023-02-13 DIAGNOSIS — Z1231 Encounter for screening mammogram for malignant neoplasm of breast: Secondary | ICD-10-CM

## 2023-02-13 LAB — WET PREP FOR TRICH, YEAST, CLUE

## 2023-02-13 LAB — URINALYSIS, COMPLETE W/RFL CULTURE
Bacteria, UA: NONE SEEN /[HPF]
Bilirubin Urine: NEGATIVE
Glucose, UA: NEGATIVE
Hgb urine dipstick: NEGATIVE
Hyaline Cast: NONE SEEN /[LPF]
Ketones, ur: NEGATIVE
Leukocyte Esterase: NEGATIVE
Nitrites, Initial: NEGATIVE
Protein, ur: NEGATIVE
RBC / HPF: NONE SEEN /[HPF] (ref 0–2)
Specific Gravity, Urine: 1.025 (ref 1.001–1.035)
WBC, UA: NONE SEEN /[HPF] (ref 0–5)
pH: 6 (ref 5.0–8.0)

## 2023-02-13 LAB — NO CULTURE INDICATED

## 2023-02-13 NOTE — Progress Notes (Addendum)
 63 y.o. y.o. female here for annual exam. Recent ER visit for hypotension and syncope.  Reports they could not determine cause.  No LMP recorded. Patient has had a hysterectomy. Cervix and ovaries intact.     FINAL DIAGNOSIS Pathology   MICROSCOPIC EXAMINATION AND DIAGNOSIS    UTERUS, HYSTERECTOMY:   UTERINE CORPUS WITH BENIGN PROLERATIVE ENDOMETRIUM AND   BENIGN MYOMETRIUM.   SEROSAL SURFACE WITH FIBROUS ADHESIONS AND HEMORRHAGE. NO   ENDOMETRIOSIS   IDENTIFIED.   NO UTERINE CERVIX IDENTIFIED.  Reports hyper to hypothyroid with 60lb weight loss in a year after taking mounjaro .  She is no longer on this. She would like to retest her thyroid . Reports a dark odorous discharge.   Reports urinary incontinence that is bothersome to her.  Colonoscopy: 2017 with multiple polyps. Repeat in 3 years-referral placed Dxa: referral placed. Loss of height 5/7 to 5/3 Mammogram: 03/23/22 birads 1 Pap: 2016 normal. no prior abnormal. Repeat today  Body mass index is 20.65 kg/m.     02/13/2023    2:19 PM 10/26/2022    3:24 PM 09/21/2022    1:45 PM  Depression screen PHQ 2/9  Decreased Interest 0 0 0  Down, Depressed, Hopeless 0 0 0  PHQ - 2 Score 0 0 0  Altered sleeping   0  Tired, decreased energy   0  Change in appetite   0  Feeling bad or failure about yourself    0  Trouble concentrating   0  Moving slowly or fidgety/restless   0  Suicidal thoughts   0  PHQ-9 Score   0  Difficult doing work/chores   Not difficult at all    Blood pressure 106/68, pulse 61, height 5' 5.75 (1.67 m), weight 127 lb (57.6 kg), SpO2 98%.  No results found for: DIAGPAP, HPVHIGH, ADEQPAP  GYN HISTORY: No results found for: DIAGPAP, HPVHIGH, ADEQPAP  OB History  Gravida Para Term Preterm AB Living  5 2   3 2   SAB IAB Ectopic Multiple Live Births  2  1      # Outcome Date GA Lbr Len/2nd Weight Sex Type Anes PTL Lv  5 Ectopic           4 SAB           3 SAB           2 Para            1 Para             Past Medical History:  Diagnosis Date   Allergy    Anemia    past hx    Anxiety state, unspecified    Arthralgia of temporomandibular joint    Arthritis    Asthma    as a child , not now per pt    Conversion disorder    Dementia (HCC)    Depressive disorder, not elsewhere classified    Diabetes mellitus without complication (HCC)    off all medicines as of 04-16-2019 PV    Dysphagia, unspecified(787.20)    Endometriosis    Esophageal reflux    Fibromyalgia    Headache(784.0)    Heart murmur    past hx murmur    Hx of adenomatous colonic polyps    Hypertension 07/18/2020   Irritable bowel syndrome    Mitral valve disorders(424.0)    Myalgia and myositis, unspecified    Overweight(278.02)    Periodic limb movement disorder    Pure  hypercholesterolemia    RLS (restless legs syndrome)    Seizures (HCC)    last episode 5 yrs ago    Stroke [facility])    TIA per pt many years ago    Type II diabetes mellitus with manifestations (HCC) 03/16/2022   Unspecified hypothyroidism     Past Surgical History:  Procedure Laterality Date   arm surgery     COLONOSCOPY  04/14/2008   FOOT SURGERY     LIPOMA EXCISION     BESIDES BELLY BUTTON   POLYPECTOMY     HPP x 1   TONSILLECTOMY     TOTAL ABDOMINAL HYSTERECTOMY     TAH   UPPER GASTROINTESTINAL ENDOSCOPY  2010   [facility]    WRIST SURGERY      Current Outpatient Medications on File Prior to Visit  Medication Sig Dispense Refill   albuterol  (VENTOLIN  HFA) 108 (90 Base) MCG/ACT inhaler Inhale 2 puffs into the lungs every 6 (six) hours as needed for wheezing or shortness of breath 6.7 g 3   ALPRAZolam  (XANAX ) 1 MG tablet Take 1 tablet (1 mg) by mouth 3 times daily as needed for anxiety. 90 tablet 1   buPROPion  (WELLBUTRIN  XL) 150 MG 24 hr tablet Take 1 tablet (150 mg total) by mouth daily. 90 tablet 0   ezetimibe  (ZETIA ) 10 MG tablet Take 1 tablet (10 mg total) by mouth daily. 90 tablet 0   glucose blood  (CONTOUR NEXT TEST) test strip Use to test blood sugar daily as directed 100 each 3   levothyroxine  (SYNTHROID ) 75 MCG tablet Take 1 tablet (75 mcg total) by mouth every morning. 90 tablet 3   Microlet Lancets MISC Use to test blood sugar daily as directed 100 each 3   potassium chloride  SA (KLOR-CON  M) 20 MEQ tablet Take 1 tablet (20 mEq total) by mouth 2 (two) times daily. 180 tablet 0   pravastatin  (PRAVACHOL ) 20 MG tablet Take 1 tablet (20 mg total) by mouth daily. 90 tablet 1   sertraline  (ZOLOFT ) 100 MG tablet Take 1 tablet (100 mg total) by mouth daily. 90 tablet 1   tirzepatide  (MOUNJARO ) 5 MG/0.5ML Pen Inject 5 mg into the skin every 7 (seven) days. (Patient not taking: Reported on 02/13/2023) 6 mL 1   tirzepatide  (MOUNJARO ) 5 MG/0.5ML Pen Inject 5 mg into the skin every 7 (seven) days. (Patient not taking: Reported on 02/13/2023) 6 mL 1   tirzepatide  (MOUNJARO ) 7.5 MG/0.5ML Pen Inject 7.5 mg into the skin once a week. 6 mL 0   [DISCONTINUED] ARIPiprazole  (ABILIFY ) 2 MG tablet Take 1 tablet (2 mg total) by mouth daily. 30 tablet 0   [DISCONTINUED] divalproex  (DEPAKOTE ) 500 MG DR tablet Take 1 tablet (500 mg total) by mouth 2 (two) times daily. 60 tablet 4   No current facility-administered medications on file prior to visit.    Social History   Socioeconomic History   Marital status: Married    Spouse name: press photographer Hurley   Number of children: 2   Years of education: 12   Highest education level: Not on file  Occupational History   Occupation: Disabled    Employer: [employer]  Tobacco Use   Smoking status: Some Days    Current packs/day: 0.25    Average packs/day: 0.3 packs/day for 43.0 years (10.8 ttl pk-yrs)    Types: Cigarettes   Smokeless tobacco: Never  Vaping Use   Vaping status: Former   Substances: Nicotine, Flavoring  Substance and Sexual Activity  Alcohol use: Not Currently   Drug use: No   Sexual activity: Yes    Partners: Male    Birth  control/protection: Surgical    Comment: hysterectomy  Other Topics Concern   Not on file  Social History Narrative   Lives w/ husband Elsie Martinis Gaspari   Caffeine use: 2 cups coffee per day   Tea-3-4 drinks   Right handed    Social Drivers of Health   Financial Resource Strain: Low Risk  (09/21/2022)   Overall Financial Resource Strain (CARDIA)    Difficulty of Paying Living Expenses: Not hard at all  Food Insecurity: No Food Insecurity (09/21/2022)   Hunger Vital Sign    Worried About Running Out of Food in the Last Year: Never true    Ran Out of Food in the Last Year: Never true  Transportation Needs: No Transportation Needs (09/21/2022)   PRAPARE - Administrator, Civil Service (Medical): No    Lack of Transportation (Non-Medical): No  Physical Activity: Inactive (09/21/2022)   Exercise Vital Sign    Days of Exercise per Week: 0 days    Minutes of Exercise per Session: 0 min  Stress: No Stress Concern Present (09/21/2022)   [employer] of Occupational Health - Occupational Stress Questionnaire    Feeling of Stress : Not at all  Social Connections: Moderately Isolated (09/21/2022)   Social Connection and Isolation Panel [NHANES]    Frequency of Communication with Friends and Family: More than three times a week    Frequency of Social Gatherings with Friends and Family: More than three times a week    Attends Religious Services: Never    Database Administrator or Organizations: No    Attends Banker Meetings: Never    Marital Status: Married  Catering Manager Violence: Not At Risk (09/21/2022)   Humiliation, Afraid, Rape, and Kick questionnaire    Fear of Current or Ex-Partner: No    Emotionally Abused: No    Physically Abused: No    Sexually Abused: No    Family History  Problem Relation Age of Onset   Heart disease Mother    Heart attack Mother    Heart disease Father    Diabetes Father    Colon cancer Father 28       died age 69 from  colon cancer spreading per pt    Breast cancer Maternal Aunt    Colon cancer Paternal Aunt    Stomach cancer Paternal Aunt    Lung cancer Paternal Aunt    Rectal cancer Paternal Aunt    Stomach cancer Paternal Uncle      Allergies  Allergen Reactions   Farxiga  [Dapagliflozin ] Other (See Comments)    Vag yeast inf   Metformin  And Related Diarrhea   Semaglutide  Nausea And Vomiting and Other (See Comments)   Semaglutide (0.25 Or 0.5mg -Dos) Nausea And Vomiting   Statins Other (See Comments)    Muscle weakness/pains   Augmentin  [Amoxicillin -Pot Clavulanate] Other (See Comments)    Patient states she recently reported to dentist and was advised to report to providers as allergy.  States she got blisters under her eyes after taking Augmentin .   Crestor  [Rosuvastatin ] Other (See Comments)    Muscle aches   Lipitor [Atorvastatin] Other (See Comments)    Muscle aches   Lisinopril  Cough   Zocor [Simvastatin] Other (See Comments)    Leg pain per patient/PE   Other Other (See Comments)      Patient's last  menstrual period was No LMP recorded. Patient has had a hysterectomy..            Review of Systems Alls systems reviewed and are negative.     Physical Exam HENT:     Head:     Comments: Enlarged thyroid       A:         Well Woman GYN exam                             P:        Pap smear collected today Encouraged annual mammogram screening Colon cancer screening referral placed today DXA ordered today Labs and immunizations ordered today  Reports she has had the flu shot this year Encouraged healthy lifestyle practices Encouraged Vit D and Calcium   Referral placed to urogyn for urinary incontinence  No follow-ups on file.  Almarie MARLA Carpen

## 2023-02-13 NOTE — Addendum Note (Signed)
 Addended by: Earley Favor on: 02/13/2023 04:28 PM   Modules accepted: Orders

## 2023-02-13 NOTE — Addendum Note (Signed)
 Addended by: Earley Favor on: 02/13/2023 03:07 PM   Modules accepted: Orders

## 2023-02-14 NOTE — Telephone Encounter (Signed)
 Left message on machine for patient to return our call

## 2023-02-15 LAB — CYTOLOGY - PAP
Comment: NEGATIVE
Diagnosis: NEGATIVE
High risk HPV: NEGATIVE

## 2023-02-15 NOTE — Telephone Encounter (Signed)
 Copied from CRM [id number]. Topic: Complaint (DO NOT CONVERT) - Staff >> Feb 12, 2023 11:04 AM Russell PARAS wrote: Date of Incident: 02/12/2023 Details of complaint: Pt says she received a message saying she had an appointment on 01/06 at 2:00 pm, which she did not schedule. She is having appointments scheduled without her knowledge. She has been charged for visits that she was not aware of and refuses to pay more for visits she did not schedule. Came to an appointment on several occasions where she was told she didn't have an appointment and she had a card that validated she did have an appt. Issues began when a person she knows began working in the office. Has attended her 21-month follow up with Dr. Norleen, to have her prescriptions refilled and they were denied. Has had issues with getting prescriptions refilled, even after a hospital discharge. Has had issues with office since 2017. How would the patient like to see it resolved? Would like staff responsible to be terminated or have someone contact her to speak more about the situation. Has filed grievance with the office in the past.  On a scale of 1-10, how was your experience?  What would it take to bring it to a 10?   Route to Research Officer, Political Party.

## 2023-02-16 ENCOUNTER — Telehealth: Payer: Self-pay

## 2023-02-16 ENCOUNTER — Encounter: Payer: Self-pay | Admitting: Obstetrics and Gynecology

## 2023-02-16 NOTE — Telephone Encounter (Signed)
 Copied from CRM [id number]. Topic: Clinical - Lab/Test Results >> Feb 16, 2023 11:21 AM Tasha [name] wrote: Reason for CRM: patient called in to ask about CT scan results.I called the CAL line and they told me to tell the patient that she had to consult her pulmonologist,however patient doesn't have a pulmonologist,so she is wondering who she should speak to regarding these results.

## 2023-02-19 NOTE — Telephone Encounter (Signed)
 Spoke the patient and the telephone number to pulmonology was given.

## 2023-02-19 NOTE — Telephone Encounter (Signed)
Patient is aware. See phone note

## 2023-02-20 ENCOUNTER — Telehealth: Payer: Self-pay

## 2023-02-20 NOTE — Telephone Encounter (Signed)
 PAP RE-ENROLLMENT LEFT VM MESSAGE FOR PT TO CALL BACK FOR TRELEGY

## 2023-02-20 NOTE — Telephone Encounter (Signed)
 PAP: PAP application for Trelegy Ellipta, [employer] ([employer]) has been mailed to pt's home address on file.    Will fax A REQUEST FOR PRESCRIPTION FROM PROVIDER when pt's portion is received.

## 2023-03-03 ENCOUNTER — Other Ambulatory Visit (HOSPITAL_COMMUNITY): Payer: Self-pay

## 2023-03-05 ENCOUNTER — Ambulatory Visit: Payer: Self-pay | Admitting: Internal Medicine

## 2023-03-05 NOTE — Telephone Encounter (Signed)
Copied from CRM [id number]. Topic: Clinical - Red Word Triage >> Mar 05, 2023 11:25 AM Isabell A wrote: [facility] that prompted transfer to Nurse Triage: Blisters in her mouth & throat, states her eyes don't feel right as well - its watery & has discharge coming out.  Chief Complaint: mouth sores, eyes drainage Symptoms: discharge from eyes, mouth sores and throat sores Frequency: constant Pertinent Negatives: Patient denies fever, difficulty breathing and difficulty swallowing Disposition: [] ED /[] Urgent Care (no appt availability in office) / [x] Appointment(In office/virtual)/ []  [facility]/ [] Home Care/ [] Refused Recommended Disposition /[] [facility]/ []  Follow-up with PCP Additional Notes: apt made for tomorrow.  States has had a mouth infection before like this.  Care advice given, denies questions and instructed to go to er if becomes worse.   Reason for Disposition  Gums are red, painful and have many ulcers  Answer Assessment - Initial Assessment Questions 1. LOCATION: "Where is the ulcer located?"      Around the gums and throat and eyes 2. NUMBER: "How many ulcers are there?"      Feel little 3. SIZE: "How large is the ulcer?"      They feel little  4. SEVERITY: "Are they painful?" If Yes, ask: "How bad is it?"  (Scale 1-10; or mild, moderate, severe)  - MILD - eating  and drinking normally   - MODERATE - decreased liquid intake   - SEVERE - drinking very little      mild 5. ONSET: "When did you first notice the ulcer?"      Thursday 6. RECURRENT SYMPTOM: "Have you had a mouth ulcer before?" If Yes, ask: "When was the last time?" and "What happened that time?"      yes 7. CAUSE: "What do you think is causing the mouth ulcer?"     infection 8. OTHER SYMPTOMS: "Do you have any other symptoms?" (e.g., fever)     Denies.  Protocols used: Mouth Ulcers-A-AH

## 2023-03-06 ENCOUNTER — Ambulatory Visit: Payer: No Typology Code available for payment source | Admitting: Internal Medicine

## 2023-03-13 ENCOUNTER — Ambulatory Visit: Payer: Self-pay | Admitting: Internal Medicine

## 2023-03-13 NOTE — Telephone Encounter (Signed)
 Appointment scheduled.

## 2023-03-13 NOTE — Telephone Encounter (Signed)
  Chief Complaint: Increased anxiety Symptoms: Anxiety, HA, tachycardia Frequency: Worsening recently Pertinent Negatives: Patient denies CP, SOB Disposition: [] ED /[] Urgent Care (no appt availability in office) / [x] Appointment(In office/virtual)/ []  [place] [facility]/ [] Home Care/ [] Refused Recommended Disposition /[] [facility]/ []  Follow-up with PCP Additional Notes: Pt reports she has been experiencing worsening frequency/intensity of anxiety attacks. Pt notes these are often triggered by loud noises/yelling and result in her feeling like her heart is racing, HA can't see like I'm blacking out but denies LOC, CP, SOB. Pt reports she is prescribed Xanax  three times daily but has recently been taking more with the increase in anxiety stating she often has to take 2 to get her anxiety to resolve. OV scheduled tomorrow AM.    Copied from CRM [id number]. Topic: Clinical - Red Word Triage >> Mar 13, 2023 11:50 AM Tasha [name]: Red Word that prompted transfer to Nurse Triage: Requesting physical but has Anxiety attacks to the point where she can't see correctly Reason for Disposition  MODERATE anxiety (e.g., persistent or frequent anxiety symptoms; interferes with sleep, school, or work)  Answer Assessment - Initial Assessment Questions 2. ANXIETY SYMPTOMS: Can you describe how you (your loved one; patient) have been feeling? (e.g., tense, restless, panicky, anxious, keyed up, overwhelmed, sense of impending doom).      Loud noises, yelling trigger the anxiety attacks tachycardia, HA can't see everything starts going black, have to take 1-2 Xanax  to feel better 3. ONSET: How long have you been feeling this way? (e.g., hours, days, weeks)     About 30 minutes after she takes Xanax  4. SEVERITY: How would you rate the level of anxiety? (e.g., 0 - 10; or mild, moderate, severe).     10/10 6. HISTORY: Have you felt this way before? Have you ever been diagnosed with an  anxiety problem in the past? (e.g., generalized anxiety disorder, panic attacks, PTSD). If Yes, ask: How was this problem treated? (e.g., medicines, counseling, etc.)     History of anxiety 7. RISK OF HARM - SUICIDAL IDEATION: Do you ever have thoughts of hurting or killing yourself? If Yes, ask:  Do you have these feelings now? Do you have a plan on how you would do this?     None 8. TREATMENT:  What has been done so far to treat this anxiety? (e.g., medicines, relaxation strategies). What has helped?     Xanax , takes 3/day, sometimes is taking more when she has the anxiety attacks 10. POTENTIAL TRIGGERS: Do you drink caffeinated beverages (e.g., coffee, colas, teas), and how much daily? Do you drink alcohol or use any drugs? Have you started any new medicines recently?       Loud noises, triggers 11. PATIENT SUPPORT: Who is with you now? Who do you live with? Do you have family or friends who you can talk to?        Yes, a friend 12. OTHER SYMPTOMS: Do you have any other symptoms? (e.g., feeling depressed, trouble concentrating, trouble sleeping, trouble breathing, palpitations or fast heartbeat, chest pain, sweating, nausea, or diarrhea)       Irritability, trouble concentrating, sleeping.  Protocols used: Anxiety and Panic Attack-A-AH

## 2023-03-14 ENCOUNTER — Ambulatory Visit (INDEPENDENT_AMBULATORY_CARE_PROVIDER_SITE_OTHER): Payer: No Typology Code available for payment source | Admitting: Internal Medicine

## 2023-03-14 ENCOUNTER — Other Ambulatory Visit (HOSPITAL_COMMUNITY): Payer: Self-pay

## 2023-03-14 ENCOUNTER — Encounter: Payer: Self-pay | Admitting: Internal Medicine

## 2023-03-14 VITALS — BP 110/70 | Temp 97.6°F | Wt 130.2 lb

## 2023-03-14 DIAGNOSIS — F411 Generalized anxiety disorder: Secondary | ICD-10-CM

## 2023-03-14 DIAGNOSIS — F339 Major depressive disorder, recurrent, unspecified: Secondary | ICD-10-CM

## 2023-03-14 DIAGNOSIS — K121 Other forms of stomatitis: Secondary | ICD-10-CM | POA: Diagnosis not present

## 2023-03-14 MED ORDER — LIDOCAINE VISCOUS HCL 2 % MT SOLN
15.0000 mL | OROMUCOSAL | 0 refills | Status: AC | PRN
  Filled 2023-03-14: qty 100, 3d supply, fill #0

## 2023-03-14 NOTE — Progress Notes (Signed)
 Established Patient Office Visit     CC/Reason for Visit: Discuss anxiety and mouth sores  HPI: Tasha [name] is a 63 y.o. female who is coming in today for the above mentioned reasons. Past Medical History is significant for: Generalized anxiety disorder and depression, hypertension, hyperlipidemia, type 2 diabetes, hypothyroidism, insomnia.  She has been experiencing a lot of anxiety and panic attacks.  She is on Zoloft  100 mg daily, Wellbutrin  150 mg daily and Xanax  1 mg 3 times a day.  She says that sometimes loud noises will initiate the panic attack.  She is also been having some ulcers in her mouth that she would like me to look at.   Past Medical/Surgical History: Past Medical History:  Diagnosis Date   Allergy    Anemia    past hx    Anxiety state, unspecified    Arthralgia of temporomandibular joint    Arthritis    Asthma    as a child , not now per pt    Conversion disorder    Dementia (HCC)    Depressive disorder, not elsewhere classified    Diabetes mellitus without complication (HCC)    off all medicines as of 04-16-2019 PV    Dysphagia, unspecified(787.20)    Endometriosis    Esophageal reflux    Fibromyalgia    Headache(784.0)    Heart murmur    past hx murmur    Hx of adenomatous colonic polyps    Hypertension 07/18/2020   Irritable bowel syndrome    Mitral valve disorders(424.0)    Myalgia and myositis, unspecified    Overweight(278.02)    Periodic limb movement disorder    Pure hypercholesterolemia    RLS (restless legs syndrome)    Seizures (HCC)    last episode 5 yrs ago    Stroke [facility])    TIA per pt many years ago    Type II diabetes mellitus with manifestations (HCC) 03/16/2022   Unspecified hypothyroidism     Past Surgical History:  Procedure Laterality Date   arm surgery     COLONOSCOPY  04/14/2008   FOOT SURGERY     LIPOMA EXCISION     BESIDES BELLY BUTTON   POLYPECTOMY     HPP x 1   TONSILLECTOMY     TOTAL ABDOMINAL  HYSTERECTOMY     TAH   UPPER GASTROINTESTINAL ENDOSCOPY  2010   [facility]    WRIST SURGERY      Social History:  reports that she has been smoking cigarettes. She has a 10.8 pack-year smoking history. She has never used smokeless tobacco. She reports that she does not currently use alcohol. She reports that she does not use drugs.  Allergies: Allergies  Allergen Reactions   Farxiga  [Dapagliflozin ] Other (See Comments)    Vag yeast inf   Metformin  And Related Diarrhea   Semaglutide  Nausea And Vomiting and Other (See Comments)   Semaglutide (0.25 Or 0.5mg -Dos) Nausea And Vomiting   Statins Other (See Comments)    Muscle weakness/pains   Augmentin  [Amoxicillin -Pot Clavulanate] Other (See Comments)    Patient states she recently reported to dentist and was advised to report to providers as allergy.  States she got blisters under her eyes after taking Augmentin .   Crestor  [Rosuvastatin ] Other (See Comments)    Muscle aches   Lipitor [Atorvastatin] Other (See Comments)    Muscle aches   Lisinopril  Cough   Zocor [Simvastatin] Other (See Comments)    Leg pain per patient/PE  Other Other (See Comments)    Family History:  Family History  Problem Relation Age of Onset   Heart disease Mother    Heart attack Mother    Heart disease Father    Diabetes Father    Colon cancer Father 65       died age 12 from colon cancer spreading per pt    Breast cancer Maternal Aunt    Colon cancer Paternal Aunt    Stomach cancer Paternal Aunt    Lung cancer Paternal Aunt    Rectal cancer Paternal Aunt    Stomach cancer Paternal Uncle      Current Outpatient Medications:    albuterol  (VENTOLIN  HFA) 108 (90 Base) MCG/ACT inhaler, Inhale 2 puffs into the lungs every 6 (six) hours as needed for wheezing or shortness of breath, Disp: 6.7 g, Rfl: 3   ALPRAZolam  (XANAX ) 1 MG tablet, Take 1 tablet (1 mg) by mouth 3 times daily as needed for anxiety., Disp: 90 tablet, Rfl: 1   buPROPion  (WELLBUTRIN  XL)  150 MG 24 hr tablet, Take 1 tablet (150 mg total) by mouth daily., Disp: 90 tablet, Rfl: 0   ezetimibe  (ZETIA ) 10 MG tablet, Take 1 tablet (10 mg total) by mouth daily., Disp: 90 tablet, Rfl: 0   glucose blood (CONTOUR NEXT TEST) test strip, Use to test blood sugar daily as directed, Disp: 100 each, Rfl: 3   levothyroxine  (SYNTHROID ) 75 MCG tablet, Take 1 tablet (75 mcg total) by mouth every morning., Disp: 90 tablet, Rfl: 3   lidocaine  (XYLOCAINE ) 2 % solution, Use as directed 15 mLs in the mouth or throat as needed for mouth pain., Disp: 100 mL, Rfl: 0   Microlet Lancets MISC, Use to test blood sugar daily as directed, Disp: 100 each, Rfl: 3   potassium chloride  SA (KLOR-CON  M) 20 MEQ tablet, Take 1 tablet (20 mEq total) by mouth 2 (two) times daily., Disp: 180 tablet, Rfl: 0   pravastatin  (PRAVACHOL ) 20 MG tablet, Take 1 tablet (20 mg total) by mouth daily., Disp: 90 tablet, Rfl: 1   sertraline  (ZOLOFT ) 100 MG tablet, Take 1 tablet (100 mg total) by mouth daily., Disp: 90 tablet, Rfl: 1   tirzepatide  (MOUNJARO ) 5 MG/0.5ML Pen, Inject 5 mg into the skin every 7 (seven) days., Disp: 6 mL, Rfl: 1   tirzepatide  (MOUNJARO ) 5 MG/0.5ML Pen, Inject 5 mg into the skin every 7 (seven) days., Disp: 6 mL, Rfl: 1   tirzepatide  (MOUNJARO ) 7.5 MG/0.5ML Pen, Inject 7.5 mg into the skin once a week., Disp: 6 mL, Rfl: 0  Review of Systems:  Negative unless indicated in HPI.   Physical Exam: Vitals:   03/14/23 1001  BP: 110/70  Temp: 97.6 F (36.4 C)  TempSrc: Oral  Weight: 130 lb 3.2 oz (59.1 kg)    Body mass index is 21.17 kg/m.   Physical Exam Vitals reviewed.  Constitutional:      Appearance: Normal appearance.  HENT:     Head: Normocephalic and atraumatic.  Eyes:     Conjunctiva/sclera: Conjunctivae normal.  Skin:    General: Skin is warm and dry.  Neurological:     General: No focal deficit present.     Mental Status: She is alert and oriented to person, place, and time.   Psychiatric:        Mood and Affect: Mood normal.        Behavior: Behavior normal.        Thought Content: Thought content normal.  Judgment: Judgment normal.      Impression and Plan:  GAD (generalized anxiety disorder) -     Ambulatory referral to Psychiatry  Depression, recurrent (HCC) -     Ambulatory referral to Psychiatry  Ulcer, oral mucosa traumatic -     Lidocaine  Viscous HCl; Use as directed 15 mLs in the mouth or throat as needed for mouth pain.  Dispense: 100 mL; Refill: 0   -She is already on maximal dose psychotropic medication that I feel comfortable prescribing.  Have recommended psychiatry referral and she is agreeable to this.  I have also recommended CBT and have provided resources which she is thankful for. -It appears she has been grinding down on her teeth and biting at nighttime and has cheek mucosa damage.  Have recommended that she wear a mouthguard at bedtime and will prescribe some viscous lidocaine  to aid with the healing process.  Time spent:31 minutes reviewing chart, interviewing and examining patient and formulating plan of care.     [name], MD [facility]

## 2023-03-27 ENCOUNTER — Other Ambulatory Visit: Payer: Self-pay | Admitting: Internal Medicine

## 2023-03-27 ENCOUNTER — Other Ambulatory Visit (HOSPITAL_COMMUNITY): Payer: Self-pay

## 2023-03-27 DIAGNOSIS — F333 Major depressive disorder, recurrent, severe with psychotic symptoms: Secondary | ICD-10-CM

## 2023-03-27 MED ORDER — BUPROPION HCL ER (XL) 150 MG PO TB24
150.0000 mg | ORAL_TABLET | Freq: Every day | ORAL | 0 refills | Status: DC
  Filled 2023-03-27: qty 90, 90d supply, fill #0

## 2023-03-27 MED ORDER — EZETIMIBE 10 MG PO TABS
10.0000 mg | ORAL_TABLET | Freq: Every day | ORAL | 0 refills | Status: DC
  Filled 2023-03-27: qty 90, 90d supply, fill #0

## 2023-03-27 NOTE — Telephone Encounter (Signed)
Copied from CRM [id number]. Topic: Clinical - Medication Refill >> Mar 27, 2023  1:40 PM Almira Coaster wrote: Most Recent Primary Care Visit:  Provider: Chaya Jan Y  Department: [facility]  Visit Type: ACUTE  Date: 03/14/2023  Medication: potassium chloride SA (KLOR-CON M) 20 MEQ tablet  Has the patient contacted their pharmacy? Yes, contact primary care office  (Agent: If no, request that the patient contact the pharmacy for the refill. If patient does not wish to contact the pharmacy document the reason why and proceed with request.) (Agent: If yes, when and what did the pharmacy advise?)  Is this the correct pharmacy for this prescription? Yes If no, delete pharmacy and type the correct one.  This is the patient's preferred pharmacy:  [facility] [address] [place] [address] Phone: [phone] Fax: [phone]    Has the prescription been filled recently? No  Is the patient out of the medication? Yes  Has the patient been seen for an appointment in the last year OR does the patient have an upcoming appointment? Yes  Can we respond through MyChart? Yes  Agent: Please be advised that Rx refills may take up to 3 business days. We ask that you follow-up with your pharmacy.

## 2023-03-28 NOTE — Progress Notes (Signed)
Pharmacy Medication Assistance Program Note    03/28/2023  Patient ID: Tasha [name], female   DOB: 10/03/1960, 62 y.o.   MRN: [id number]     03/28/2023  Outreach Medication One  Manufacturer Medication One Gl[employer]Smith[employer]Kline (GSK)  GlaxoSmithKline (GSK) Drugs Trelegy  Dose of Trelegy 100  Type of Audiological scientist Items Requested Application;Proof of Income  Name of Prescriber [employer]     Signature

## 2023-03-30 ENCOUNTER — Other Ambulatory Visit: Payer: Self-pay | Admitting: Internal Medicine

## 2023-03-30 ENCOUNTER — Other Ambulatory Visit (HOSPITAL_COMMUNITY): Payer: Self-pay

## 2023-03-30 ENCOUNTER — Other Ambulatory Visit: Payer: Self-pay

## 2023-03-30 DIAGNOSIS — F411 Generalized anxiety disorder: Secondary | ICD-10-CM

## 2023-03-30 NOTE — Telephone Encounter (Signed)
e2C2 will let pt know md not in office today will be back on 04-02-2023

## 2023-04-02 ENCOUNTER — Other Ambulatory Visit (HOSPITAL_COMMUNITY): Payer: Self-pay

## 2023-04-02 MED ORDER — ALPRAZOLAM 1 MG PO TABS
1.0000 mg | ORAL_TABLET | Freq: Three times a day (TID) | ORAL | 1 refills | Status: DC | PRN
  Filled 2023-04-02: qty 90, 30d supply, fill #0
  Filled 2023-05-10: qty 90, 30d supply, fill #1

## 2023-04-10 ENCOUNTER — Other Ambulatory Visit (HOSPITAL_COMMUNITY): Payer: Self-pay

## 2023-04-10 DIAGNOSIS — E039 Hypothyroidism, unspecified: Secondary | ICD-10-CM | POA: Diagnosis not present

## 2023-04-10 DIAGNOSIS — Z7985 Long-term (current) use of injectable non-insulin antidiabetic drugs: Secondary | ICD-10-CM | POA: Diagnosis not present

## 2023-04-10 DIAGNOSIS — E1165 Type 2 diabetes mellitus with hyperglycemia: Secondary | ICD-10-CM | POA: Diagnosis not present

## 2023-04-10 DIAGNOSIS — E782 Mixed hyperlipidemia: Secondary | ICD-10-CM | POA: Diagnosis not present

## 2023-04-10 MED ORDER — LEVOTHYROXINE SODIUM 75 MCG PO TABS
75.0000 ug | ORAL_TABLET | Freq: Every morning | ORAL | 3 refills | Status: DC
  Filled 2023-07-04 (×2): qty 90, 90d supply, fill #0
  Filled 2023-10-02: qty 90, 90d supply, fill #1

## 2023-04-10 MED ORDER — PRAVASTATIN SODIUM 20 MG PO TABS
20.0000 mg | ORAL_TABLET | Freq: Every day | ORAL | 1 refills | Status: DC
  Filled 2023-05-28 – 2023-06-01 (×2): qty 90, 90d supply, fill #0

## 2023-04-10 MED ORDER — MOUNJARO 7.5 MG/0.5ML ~~LOC~~ SOAJ
SUBCUTANEOUS | 1 refills | Status: DC
  Filled 2023-05-10: qty 2, 28d supply, fill #0
  Filled 2023-05-10: qty 6, 84d supply, fill #0
  Filled 2023-05-28 – 2023-06-01 (×3): qty 2, 28d supply, fill #0
  Filled 2023-06-04: qty 6, 84d supply, fill #0
  Filled 2023-06-04: qty 2, 28d supply, fill #0
  Filled 2023-06-04: qty 6, 84d supply, fill #0
  Filled 2023-06-04: qty 2, 28d supply, fill #0

## 2023-04-10 MED ORDER — EZETIMIBE 10 MG PO TABS
10.0000 mg | ORAL_TABLET | Freq: Every day | ORAL | 1 refills | Status: DC
  Filled 2023-04-10 – 2023-07-04 (×3): qty 90, 90d supply, fill #0
  Filled 2023-10-02: qty 90, 90d supply, fill #1

## 2023-04-11 ENCOUNTER — Telehealth: Payer: Self-pay

## 2023-04-11 NOTE — Telephone Encounter (Signed)
 Following up on pt PAP FOR TRELEGY (gsk) PT HAS NOT RETURN PROOF OF INCOME AND WAS FOLLOWING UP ON IT.left a HIPAA VM.

## 2023-04-18 ENCOUNTER — Other Ambulatory Visit (HOSPITAL_COMMUNITY): Payer: Self-pay

## 2023-05-04 ENCOUNTER — Ambulatory Visit

## 2023-05-04 VITALS — Ht 65.75 in | Wt 130.0 lb

## 2023-05-04 DIAGNOSIS — Z860100 Personal history of colon polyps, unspecified: Secondary | ICD-10-CM

## 2023-05-04 NOTE — Progress Notes (Signed)
 No egg or soy allergy known to patient  No issues known to pt with past sedation with any surgeries or procedures Patient denies ever being told they had issues or difficulty with intubation  No FH of Malignant Hyperthermia Pt is not on diet pills Pt is not on  home 02  Pt is not on blood thinners  Pt denies issues with constipation  No A fib or A flutter Have any cardiac testing pending-- no LOA: independent Prep: spilt dose miralax   PV completed with patient. Prep instructions sent via mychart and home address.    ONCE A WEEK INJECTIONS Ozempic,  Mounjaro, Wegovy, Trulicity, Tanzeum, Byetta, Victoza, Bydureon, & SymlinPen  -DO NOT TAKE 7 days prior to the procedure.  Last dose on or before Tuesday 4/1 failure to hold this medication will result in a cancellation or rescheduling of your procedure  ______________________________________________

## 2023-05-04 NOTE — Patient Instructions (Addendum)
 [facility] has implemented a new process for scheduling procedures.  Please note your arrival time for the [facility] is your appointment time that is shown on your written instructions.  Please do not arrive one hour prior to the time listed in your instructions.  Please ignore any outside notifications to arrive one hour early.  We apologize for any confusion and look forward to seeing you for your procedure.   ONCE A WEEK INJECTIONS Ozempic,  Mounjaro, Wegovy, Trulicity, Tanzeum, Byetta, Victoza, Bydureon, & SymlinPen  -DO NOT TAKE 7 days prior to the procedure.  Last dose on or before Tuesday 4/1 failure to hold this medication will result in a cancellation or rescheduling of your procedure  ______________________________________________

## 2023-05-10 ENCOUNTER — Other Ambulatory Visit (HOSPITAL_COMMUNITY): Payer: Self-pay

## 2023-05-12 ENCOUNTER — Other Ambulatory Visit (HOSPITAL_COMMUNITY): Payer: Self-pay

## 2023-05-15 ENCOUNTER — Encounter: Payer: Self-pay | Admitting: Internal Medicine

## 2023-05-15 NOTE — Progress Notes (Unsigned)
 [facility] History and Physical   Primary Care Physician:  Tasha [name]   Reason for Procedure:  History of colon polyps  Plan:    Colonoscopy     HPI: Tasha [name] is a 63 y.o. female presenting for surveillance colonoscopy.  In 2021 she had 7 small adenomas removed.   Past Medical History:  Diagnosis Date   Allergy    Anemia    past hx    Anxiety state, unspecified    Arthralgia of temporomandibular joint    Arthritis    Asthma    as a child , not now per pt    Conversion disorder    Dementia (HCC)    Depressive disorder, not elsewhere classified    Diabetes mellitus without complication (HCC)    off all medicines as of 04-16-2019 PV    Dysphagia, unspecified(787.20)    Endometriosis    Esophageal reflux    Fibromyalgia    Headache(784.0)    Heart murmur    past hx murmur    Hx of adenomatous colonic polyps    Hypertension 07/18/2020   Irritable bowel syndrome    Mitral valve disorders(424.0)    Myalgia and myositis, unspecified    Overweight(278.02)    Periodic limb movement disorder    Pure hypercholesterolemia    RLS (restless legs syndrome)    Seizures (HCC)    last episode 5 yrs ago    Stroke [facility])    TIA per pt many years ago    Type II diabetes mellitus with manifestations (HCC) 03/16/2022   Unspecified hypothyroidism     Past Surgical History:  Procedure Laterality Date   arm surgery     COLONOSCOPY  04/14/2008   FOOT SURGERY     LIPOMA EXCISION     BESIDES BELLY BUTTON   POLYPECTOMY     HPP x 1   TONSILLECTOMY     TOTAL ABDOMINAL HYSTERECTOMY     TAH   UPPER GASTROINTESTINAL ENDOSCOPY  2010   Tasha [name]    WRIST SURGERY      Prior to Admission medications   Medication Sig Start Date End Date Taking? Authorizing Provider  albuterol (VENTOLIN HFA) 108 (90 Base) MCG/ACT inhaler Inhale 2 puffs into the lungs every 6 (six) hours as needed for wheezing or shortness of breath 02/03/22   Tasha [name]   ALPRAZolam Prudy Feeler) 1 MG tablet Take 1 tablet (1 mg) by mouth 3 times daily as needed for anxiety. 04/02/23   Tasha [name]  buPROPion (WELLBUTRIN XL) 150 MG 24 hr tablet Take 1 tablet (150 mg total) by mouth daily. 03/27/23   Tasha [name]  ezetimibe (ZETIA) 10 MG tablet Take 1 tablet (10 mg total) by mouth daily. 04/10/23     glucose blood (CONTOUR NEXT TEST) test strip Use to test blood sugar daily as directed 10/11/22     levothyroxine (SYNTHROID) 75 MCG tablet Take 1 tablet (75 mcg total) by mouth every morning. 10/11/22     levothyroxine (SYNTHROID) 75 MCG tablet Take 1 tablet (75 mcg total) by mouth every morning. 04/10/23     lidocaine (XYLOCAINE) 2 % solution Use as directed 15 mLs in the mouth or throat as needed for mouth pain. 03/14/23   Tasha [name]  Microlet Lancets MISC Use to test blood sugar daily as directed 10/11/22     potassium chloride SA (KLOR-CON M) 20 MEQ tablet Take 1 tablet (20  mEq total) by mouth 2 (two) times daily. Patient not taking: Reported on 05/04/2023 06/03/22   Tasha [name]  pravastatin (PRAVACHOL) 20 MG tablet Take 1 tablet (20 mg total) by mouth daily. 04/10/23     sertraline (ZOLOFT) 100 MG tablet Take 1 tablet (100 mg total) by mouth daily. 12/21/22   Tasha [name]  tirzepatide [facility]) 5 MG/0.5ML Pen Inject 5 mg into the skin every 7 (seven) days. 10/11/22     tirzepatide (MOUNJARO) 7.5 MG/0.5ML Pen Inject 0.5 mL (7.5 mg total) under the skin every 7 days. 04/10/23     ARIPiprazole (ABILIFY) 2 MG tablet Take 1 tablet (2 mg total) by mouth daily. 10/10/19 02/27/20  Tasha [name]  divalproex (DEPAKOTE) 500 MG DR tablet Take 1 tablet (500 mg total) by mouth 2 (two) times daily. 10/10/19 02/27/20  Tasha [name]    Current Outpatient Medications  Medication Sig Dispense Refill   albuterol (VENTOLIN HFA) 108 (90 Base) MCG/ACT inhaler Inhale 2 puffs into the lungs every 6 (six) hours as needed for  wheezing or shortness of breath 6.7 g 3   ALPRAZolam (XANAX) 1 MG tablet Take 1 tablet (1 mg) by mouth 3 times daily as needed for anxiety. 90 tablet 1   buPROPion (WELLBUTRIN XL) 150 MG 24 hr tablet Take 1 tablet (150 mg total) by mouth daily. 90 tablet 0   ezetimibe (ZETIA) 10 MG tablet Take 1 tablet (10 mg total) by mouth daily. 90 tablet 1   glucose blood (CONTOUR NEXT TEST) test strip Use to test blood sugar daily as directed 100 each 3   levothyroxine (SYNTHROID) 75 MCG tablet Take 1 tablet (75 mcg total) by mouth every morning. 90 tablet 3   levothyroxine (SYNTHROID) 75 MCG tablet Take 1 tablet (75 mcg total) by mouth every morning. 90 tablet 3   lidocaine (XYLOCAINE) 2 % solution Use as directed 15 mLs in the mouth or throat as needed for mouth pain. 100 mL 0   Microlet Lancets MISC Use to test blood sugar daily as directed 100 each 3   potassium chloride SA (KLOR-CON M) 20 MEQ tablet Take 1 tablet (20 mEq total) by mouth 2 (two) times daily. (Patient not taking: Reported on 05/04/2023) 180 tablet 0   pravastatin (PRAVACHOL) 20 MG tablet Take 1 tablet (20 mg total) by mouth daily. 90 tablet 1   sertraline (ZOLOFT) 100 MG tablet Take 1 tablet (100 mg total) by mouth daily. 90 tablet 1   tirzepatide (MOUNJARO) 5 MG/0.5ML Pen Inject 5 mg into the skin every 7 (seven) days. 6 mL 1   tirzepatide (MOUNJARO) 7.5 MG/0.5ML Pen Inject 0.5 mL (7.5 mg total) under the skin every 7 days. 6 mL 1   No current facility-administered medications for this visit.    Allergies as of 05/16/2023 - Review Complete 05/04/2023  Allergen Reaction Noted   Farxiga [dapagliflozin] Other (See Comments) 03/19/2017   Metformin and related Diarrhea 04/10/2019   Semaglutide Nausea And Vomiting and Other (See Comments) 04/16/2019   Semaglutide(0.25 or 0.5mg -dos) Nausea And Vomiting 04/16/2019   Statins Other (See Comments) 03/26/2015   Augmentin [amoxicillin-pot clavulanate] Other (See Comments) 08/05/2019   Crestor  [rosuvastatin] Other (See Comments) 04/22/2013   Lipitor [atorvastatin] Other (See Comments) 04/22/2013   Lisinopril Cough 09/21/2022   Zocor [simvastatin] Other (See Comments) 12/16/2013   Other Other (See Comments) 11/16/2021    Family History  Problem Relation Age of Onset   Heart disease  Mother    Heart attack Mother    Heart disease Father    Diabetes Father    Colon cancer Father 80       died age 10 from colon cancer spreading per pt    Breast cancer Maternal Aunt    Colon cancer Paternal Aunt    Stomach cancer Paternal Aunt    Lung cancer Paternal Aunt    Rectal cancer Paternal Aunt    Stomach cancer Paternal Uncle     Social History   Socioeconomic History   Marital status: Married    Spouse name: Press photographer Peake   Number of children: 2   Years of education: 12   Highest education level: Not on file  Occupational History   Occupation: Disabled    Employer: [employer]  Tobacco Use   Smoking status: Some Days    Current packs/day: 0.25    Average packs/day: 0.3 packs/day for 43.0 years (10.8 ttl pk-yrs)    Types: Cigarettes   Smokeless tobacco: Never  Vaping Use   Vaping status: Former   Substances: Nicotine, Flavoring  Substance and Sexual Activity   Alcohol use: Not Currently   Drug use: No   Sexual activity: Yes    Partners: Male    Birth control/protection: Surgical    Comment: hysterectomy  Other Topics Concern   Not on file  Social History Narrative   Lives w/ husband Jearld Shines Carranza   Caffeine use: 2 cups coffee per day   Tea-3-4 drinks   Right handed    Social Drivers of Corporate investment banker Strain: Low Risk  (09/21/2022)   Overall Financial Resource Strain (CARDIA)    Difficulty of Paying Living Expenses: Not hard at all  Food Insecurity: No Food Insecurity (09/21/2022)   Hunger Vital Sign    Worried About Running Out of Food in the Last Year: Never true    Ran Out of Food in the Last Year: Never true  Transportation  Needs: No Transportation Needs (09/21/2022)   PRAPARE - Administrator, Civil Service (Medical): No    Lack of Transportation (Non-Medical): No  Physical Activity: Inactive (09/21/2022)   Exercise Vital Sign    Days of Exercise per Week: 0 days    Minutes of Exercise per Session: 0 min  Stress: No Stress Concern Present (09/21/2022)   [employer] of Occupational Health - Occupational Stress Questionnaire    Feeling of Stress : Not at all  Social Connections: Moderately Isolated (09/21/2022)   Social Connection and Isolation Panel [NHANES]    Frequency of Communication with Friends and Family: More than three times a week    Frequency of Social Gatherings with Friends and Family: More than three times a week    Attends Religious Services: Never    Database administrator or Organizations: No    Attends Banker Meetings: Never    Marital Status: Married  Catering manager Violence: Not At Risk (09/21/2022)   Humiliation, Afraid, Rape, and Kick questionnaire    Fear of Current or Ex-Partner: No    Emotionally Abused: No    Physically Abused: No    Sexually Abused: No    Review of Systems: Positive for *** All other review of systems negative except as mentioned in the HPI.  Physical Exam: Vital signs There were no vitals taken for this visit.  General:   Alert,  Well-developed, well-nourished, pleasant and cooperative in NAD Lungs:  Clear throughout to auscultation.  Heart:  Regular rate and rhythm; no murmurs, clicks, rubs,  or gallops. Abdomen:  Soft, nontender and nondistended. Normal bowel sounds.   Neuro/Psych:  Alert and cooperative. Normal mood and affect. A and O x 3   @Valor [place]  Sena Slate, [name], [facility] [phone] (pager) 05/15/2023 5:08 PM@

## 2023-05-16 ENCOUNTER — Ambulatory Visit (AMBULATORY_SURGERY_CENTER): Admitting: Internal Medicine

## 2023-05-16 ENCOUNTER — Encounter: Payer: Self-pay | Admitting: Internal Medicine

## 2023-05-16 VITALS — BP 100/55 | HR 61 | Temp 97.7°F | Resp 12 | Ht 65.75 in | Wt 130.0 lb

## 2023-05-16 DIAGNOSIS — F32A Depression, unspecified: Secondary | ICD-10-CM | POA: Diagnosis not present

## 2023-05-16 DIAGNOSIS — Z1211 Encounter for screening for malignant neoplasm of colon: Secondary | ICD-10-CM

## 2023-05-16 DIAGNOSIS — K644 Residual hemorrhoidal skin tags: Secondary | ICD-10-CM

## 2023-05-16 DIAGNOSIS — F0390 Unspecified dementia without behavioral disturbance: Secondary | ICD-10-CM | POA: Diagnosis not present

## 2023-05-16 DIAGNOSIS — K5730 Diverticulosis of large intestine without perforation or abscess without bleeding: Secondary | ICD-10-CM

## 2023-05-16 DIAGNOSIS — G2581 Restless legs syndrome: Secondary | ICD-10-CM | POA: Diagnosis not present

## 2023-05-16 DIAGNOSIS — E119 Type 2 diabetes mellitus without complications: Secondary | ICD-10-CM | POA: Diagnosis not present

## 2023-05-16 DIAGNOSIS — F419 Anxiety disorder, unspecified: Secondary | ICD-10-CM | POA: Diagnosis not present

## 2023-05-16 DIAGNOSIS — Z860101 Personal history of adenomatous and serrated colon polyps: Secondary | ICD-10-CM

## 2023-05-16 DIAGNOSIS — Z860100 Personal history of colon polyps, unspecified: Secondary | ICD-10-CM

## 2023-05-16 MED ORDER — SODIUM CHLORIDE 0.9 % IV SOLN: 500.0000 mL | INTRAVENOUS | Status: AC

## 2023-05-16 NOTE — Progress Notes (Signed)
 Sedate, gd SR, tolerated procedure well, VSS, report to RN

## 2023-05-16 NOTE — Progress Notes (Signed)
Pt states no changes to health hx since previsit

## 2023-05-16 NOTE — Patient Instructions (Addendum)
 No polyps today so next colonoscopy in 5 years.  I appreciate the opportunity to care for you. Iva Boop, MD, FACG  YOU HAD AN ENDOSCOPIC PROCEDURE TODAY AT THE [facility]:   Refer to the procedure report that was given to you for any specific questions about what was found during the examination.  If the procedure report does not answer your questions, please call your gastroenterologist to clarify.  If you requested that your care partner not be given the details of your procedure findings, then the procedure report has been included in a sealed envelope for you to review at your convenience later.  YOU SHOULD EXPECT: Some feelings of bloating in the abdomen. Passage of more gas than usual.  Walking can help get rid of the air that was put into your GI tract during the procedure and reduce the bloating. If you had a lower endoscopy (such as a colonoscopy or flexible sigmoidoscopy) you may notice spotting of blood in your stool or on the toilet paper. If you underwent a bowel prep for your procedure, you may not have a normal bowel movement for a few days.  Please Note:  You might notice some irritation and congestion in your nose or some drainage.  This is from the oxygen used during your procedure.  There is no need for concern and it should clear up in a day or so.  SYMPTOMS TO REPORT IMMEDIATELY:  Following lower endoscopy (colonoscopy or flexible sigmoidoscopy):  Excessive amounts of blood in the stool  Significant tenderness or worsening of abdominal pains  Swelling of the abdomen that is new, acute  Fever of 100F or higher   For urgent or emergent issues, a gastroenterologist can be reached at any hour by calling [phone]. Do not use MyChart messaging for urgent concerns.    DIET:  We do recommend a small meal at first, but then you may proceed to your regular diet.  Drink plenty of fluids but you should avoid alcoholic beverages for 24  hours.  MEDICATIONS: Continue present medications.  FOLLOW UP: Repeat colonoscopy in 5 years for surveillance.  Please see handouts given to you by your recovery nurse: Diverticulosis, Hemorrhoids.  Thank you for allowing [place] to provide for your healthcare needs today.  ACTIVITY:  You should plan to take it easy for the rest of today and you should NOT DRIVE or use heavy machinery until tomorrow (because of the sedation medicines used during the test).    FOLLOW UP: Our staff will call the number listed on your records the next business day following your procedure.  We will call around 7:15- 8:00 am to check on you and address any questions or concerns that you may have regarding the information given to you following your procedure. If we do not reach you, we will leave a message.     If any biopsies were taken you will be contacted by phone or by letter within the next 1-3 weeks.  Please call us at [phone] if you have not heard about the biopsies in 3 weeks.    SIGNATURES/CONFIDENTIALITY: You and/or your care partner have signed paperwork which will be entered into your electronic medical record.  These signatures attest to the fact that that the information above on your After Visit Summary has been reviewed and is understood.  Full responsibility of the confidentiality of this discharge information lies with you and/or your care-partner.

## 2023-05-16 NOTE — Op Note (Signed)
 [facility] Patient Name: Tasha [name] Procedure Date: 05/16/2023 9:35 AM MRN: [id number] Endoscopist: Iva Boop , MD, [id number] Age: 63 Referring MD:  Date of Birth: Sep 06, 1960 Gender: Female Account #: [id number] Procedure:                Colonoscopy Indications:              Last colonoscopy: May 2021 Medicines:                Monitored Anesthesia Care Procedure:                Pre-Anesthesia Assessment:                           - Prior to the procedure, a History and Physical                            was performed, and patient medications and                            allergies were reviewed. The patient's tolerance of                            previous anesthesia was also reviewed. The risks                            and benefits of the procedure and the sedation                            options and risks were discussed with the patient.                            All questions were answered, and informed consent                            was obtained. Prior Anticoagulants: The patient has                            taken no anticoagulant or antiplatelet agents. ASA                            Grade Assessment: II - A patient with mild systemic                            disease. After reviewing the risks and benefits,                            the patient was deemed in satisfactory condition to                            undergo the procedure.                           After obtaining informed consent, the colonoscope  was passed under direct vision. Throughout the                            procedure, the patient's blood pressure, pulse, and                            oxygen saturations were monitored continuously. The                            Olympus CF-HQ190L ([id number]) Colonoscope was                            introduced through the anus and advanced to the the                            cecum, identified by appendiceal  orifice and                            ileocecal valve. The colonoscopy was performed                            without difficulty. The patient tolerated the                            procedure well. The quality of the bowel                            preparation was good. The ileocecal valve,                            appendiceal orifice, and rectum were photographed.                            The bowel preparation used was Miralax via split                            dose instruction. Scope In: 9:55:24 AM Scope Out: 10:12:27 AM Scope Withdrawal Time: 0 hours 11 minutes 1 second  Total Procedure Duration: 0 hours 17 minutes 3 seconds  Findings:                 The perianal and digital rectal examinations were                            normal.                           Multiple diverticula were found in the sigmoid                            colon and ascending colon.                           External hemorrhoids were found.  The exam was otherwise without abnormality on                            direct and retroflexion views. Complications:            No immediate complications. Estimated Blood Loss:     Estimated blood loss: none. Impression:               - Diverticulosis in the sigmoid colon and in the                            ascending colon.                           - External hemorrhoids.                           - The examination was otherwise normal on direct                            and retroflexion views.                           - No specimens collected.                           - Personal history of colonic polyps. 5/21 - 7                            diminutive adenomas removed Recommendation:           - Patient has a contact number available for                            emergencies. The signs and symptoms of potential                            delayed complications were discussed with the                            patient.  Return to normal activities tomorrow.                            Written discharge instructions were provided to the                            patient.                           - Resume previous diet.                           - Continue present medications.                           - Repeat colonoscopy in 5 years for surveillance. Iva Boop, MD 05/16/2023 10:20:17 AM This report has been signed electronically.

## 2023-05-17 ENCOUNTER — Telehealth: Payer: Self-pay | Admitting: *Deleted

## 2023-05-17 ENCOUNTER — Telehealth: Payer: Self-pay | Admitting: Internal Medicine

## 2023-05-17 NOTE — Telephone Encounter (Signed)
 No answer on  follow up call. Left message.

## 2023-05-17 NOTE — Telephone Encounter (Unsigned)
 Copied from CRM [id number]. Topic: Clinical - Medication Question >> May 17, 2023  8:50 AM Baldemar Lenis P wrote: Reason for CRM: Patient would like a callback as medication tirzepatide [facility]) 7.5 MG/0.5ML Pen is too expensive, would like an alternative.

## 2023-05-28 ENCOUNTER — Other Ambulatory Visit: Payer: Self-pay | Admitting: Internal Medicine

## 2023-05-28 ENCOUNTER — Other Ambulatory Visit (HOSPITAL_COMMUNITY): Payer: Self-pay

## 2023-05-28 NOTE — Telephone Encounter (Signed)
 Copied from CRM [id number]. Topic: Clinical - Medication Refill >> May 28, 2023  1:22 PM Orien Bird wrote: Most Recent Primary Care Visit:  Provider: Marguerita Shih Y  Department: [facility]  Visit Type: ACUTE  Date: 03/14/2023  Medication: albuterol  (VENTOLIN  HFA) 108 (90 Base) MCG/ACT inhaler  Has the patient contacted their pharmacy? Yes (Agent: If no, request that the patient contact the pharmacy for the refill. If patient does not wish to contact the pharmacy document the reason why and proceed with request.) (Agent: If yes, when and what did the pharmacy advise?)  Is this the correct pharmacy for this prescription? Yes If no, delete pharmacy and type the correct one.  This is the patient's preferred pharmacy:  [facility] [address] [place] [address] Phone: [phone] Fax: [phone]  Has the prescription been filled recently? No  Is the patient out of the medication? Yes  Has the patient been seen for an appointment in the last year OR does the patient have an upcoming appointment? Yes  Can we respond through MyChart? Yes  Agent: Please be advised that Rx refills may take up to 3 business days. We ask that you follow-up with your pharmacy.

## 2023-05-29 ENCOUNTER — Other Ambulatory Visit (HOSPITAL_COMMUNITY): Payer: Self-pay

## 2023-05-29 MED ORDER — ALBUTEROL SULFATE HFA 108 (90 BASE) MCG/ACT IN AERS
2.0000 | INHALATION_SPRAY | Freq: Four times a day (QID) | RESPIRATORY_TRACT | 3 refills | Status: AC | PRN
  Filled 2023-05-29: qty 6.7, 20d supply, fill #0
  Filled 2023-05-29: qty 6.7, 25d supply, fill #0
  Filled 2023-07-04: qty 6.7, 20d supply, fill #1
  Filled 2023-10-03: qty 6.7, 20d supply, fill #2

## 2023-06-01 ENCOUNTER — Other Ambulatory Visit (HOSPITAL_COMMUNITY): Payer: Self-pay

## 2023-06-01 ENCOUNTER — Other Ambulatory Visit: Payer: Self-pay

## 2023-06-04 ENCOUNTER — Other Ambulatory Visit (HOSPITAL_COMMUNITY): Payer: Self-pay

## 2023-06-04 ENCOUNTER — Other Ambulatory Visit: Payer: Self-pay | Admitting: Internal Medicine

## 2023-06-04 DIAGNOSIS — F411 Generalized anxiety disorder: Secondary | ICD-10-CM

## 2023-06-04 MED ORDER — ALPRAZOLAM 1 MG PO TABS
1.0000 mg | ORAL_TABLET | Freq: Three times a day (TID) | ORAL | 1 refills | Status: DC | PRN
  Filled 2023-06-04 – 2023-06-07 (×2): qty 90, 30d supply, fill #0
  Filled 2023-07-04 (×2): qty 90, 30d supply, fill #1

## 2023-06-05 ENCOUNTER — Other Ambulatory Visit (HOSPITAL_COMMUNITY): Payer: Self-pay

## 2023-06-07 ENCOUNTER — Other Ambulatory Visit (HOSPITAL_COMMUNITY): Payer: Self-pay

## 2023-06-14 ENCOUNTER — Other Ambulatory Visit: Payer: Self-pay | Admitting: *Deleted

## 2023-06-14 MED ORDER — PRAVASTATIN SODIUM 20 MG PO TABS: 20.0000 mg | ORAL_TABLET | Freq: Every day | ORAL | 1 refills | Status: AC

## 2023-06-27 NOTE — Telephone Encounter (Signed)
 Gave pt a call to follow up on PAP Trelegy per [employer] waiting on proof of income,spoke with pt explain she will  be bring proof of income to the office,pt will be calling Cristy RPH due to having other issues with other meds,pt will call back to let me know if she drop off paperwork. also we might have to re-do PAP due to lapse of time.

## 2023-07-04 ENCOUNTER — Other Ambulatory Visit: Payer: Self-pay | Admitting: Internal Medicine

## 2023-07-04 ENCOUNTER — Other Ambulatory Visit: Payer: Self-pay

## 2023-07-04 ENCOUNTER — Other Ambulatory Visit (HOSPITAL_COMMUNITY): Payer: Self-pay

## 2023-07-04 DIAGNOSIS — F333 Major depressive disorder, recurrent, severe with psychotic symptoms: Secondary | ICD-10-CM

## 2023-07-04 DIAGNOSIS — F411 Generalized anxiety disorder: Secondary | ICD-10-CM

## 2023-07-04 MED ORDER — BUPROPION HCL ER (XL) 150 MG PO TB24
150.0000 mg | ORAL_TABLET | Freq: Every day | ORAL | 0 refills | Status: DC
  Filled 2023-07-04: qty 90, 90d supply, fill #0

## 2023-07-04 MED ORDER — SERTRALINE HCL 100 MG PO TABS
100.0000 mg | ORAL_TABLET | Freq: Every day | ORAL | 0 refills | Status: DC
  Filled 2023-07-04: qty 90, 90d supply, fill #0

## 2023-07-04 MED ORDER — MOUNJARO 7.5 MG/0.5ML ~~LOC~~ SOAJ
7.5000 mg | SUBCUTANEOUS | 1 refills | Status: AC
  Filled 2023-07-04: qty 6, 84d supply, fill #0
  Filled 2023-07-06: qty 2, 28d supply, fill #0
  Filled 2023-07-27 – 2023-08-23 (×2): qty 2, 28d supply, fill #1
  Filled 2023-10-02: qty 2, 28d supply, fill #2
  Filled 2023-10-25: qty 2, 28d supply, fill #3
  Filled 2023-12-05: qty 2, 28d supply, fill #4
  Filled 2024-01-05: qty 2, 28d supply, fill #5

## 2023-07-05 DIAGNOSIS — J449 Chronic obstructive pulmonary disease, unspecified: Secondary | ICD-10-CM | POA: Diagnosis not present

## 2023-07-05 DIAGNOSIS — I10 Essential (primary) hypertension: Secondary | ICD-10-CM | POA: Diagnosis not present

## 2023-07-05 DIAGNOSIS — M1990 Unspecified osteoarthritis, unspecified site: Secondary | ICD-10-CM | POA: Diagnosis not present

## 2023-07-05 DIAGNOSIS — E039 Hypothyroidism, unspecified: Secondary | ICD-10-CM | POA: Diagnosis not present

## 2023-07-05 DIAGNOSIS — Z008 Encounter for other general examination: Secondary | ICD-10-CM | POA: Diagnosis not present

## 2023-07-05 DIAGNOSIS — F332 Major depressive disorder, recurrent severe without psychotic features: Secondary | ICD-10-CM | POA: Diagnosis not present

## 2023-07-05 DIAGNOSIS — Z681 Body mass index (BMI) 19 or less, adult: Secondary | ICD-10-CM | POA: Diagnosis not present

## 2023-07-05 DIAGNOSIS — E785 Hyperlipidemia, unspecified: Secondary | ICD-10-CM | POA: Diagnosis not present

## 2023-07-05 DIAGNOSIS — E1151 Type 2 diabetes mellitus with diabetic peripheral angiopathy without gangrene: Secondary | ICD-10-CM | POA: Diagnosis not present

## 2023-07-05 DIAGNOSIS — G43909 Migraine, unspecified, not intractable, without status migrainosus: Secondary | ICD-10-CM | POA: Diagnosis not present

## 2023-07-05 DIAGNOSIS — E1169 Type 2 diabetes mellitus with other specified complication: Secondary | ICD-10-CM | POA: Diagnosis not present

## 2023-07-05 DIAGNOSIS — G4700 Insomnia, unspecified: Secondary | ICD-10-CM | POA: Diagnosis not present

## 2023-07-05 DIAGNOSIS — F411 Generalized anxiety disorder: Secondary | ICD-10-CM | POA: Diagnosis not present

## 2023-07-06 ENCOUNTER — Other Ambulatory Visit (HOSPITAL_COMMUNITY): Payer: Self-pay

## 2023-07-09 ENCOUNTER — Other Ambulatory Visit (HOSPITAL_COMMUNITY): Payer: Self-pay

## 2023-07-11 ENCOUNTER — Telehealth: Payer: Self-pay

## 2023-07-11 NOTE — Telephone Encounter (Signed)
 Copied from CRM [id number]. Topic: General - Other >> Jul 11, 2023  4:05 PM Trula Gable C wrote: Reason for CRM: [facility] called in stated Had a phq-9 score of 9 anxiety score of 15 , set up with behavioral team so they can help with resources  [phone]

## 2023-07-13 ENCOUNTER — Other Ambulatory Visit (HOSPITAL_COMMUNITY): Payer: Self-pay

## 2023-07-26 ENCOUNTER — Other Ambulatory Visit (HOSPITAL_COMMUNITY): Payer: Self-pay

## 2023-07-27 ENCOUNTER — Other Ambulatory Visit (HOSPITAL_COMMUNITY): Payer: Self-pay

## 2023-07-27 MED ORDER — PRAVASTATIN SODIUM 20 MG PO TABS
20.0000 mg | ORAL_TABLET | Freq: Every day | ORAL | 1 refills | Status: DC
  Filled 2023-07-27: qty 90, 90d supply, fill #0
  Filled 2023-10-03: qty 90, 90d supply, fill #1

## 2023-07-31 ENCOUNTER — Other Ambulatory Visit (HOSPITAL_COMMUNITY): Payer: Self-pay

## 2023-08-23 ENCOUNTER — Other Ambulatory Visit: Payer: Self-pay | Admitting: Internal Medicine

## 2023-08-23 ENCOUNTER — Other Ambulatory Visit (HOSPITAL_COMMUNITY): Payer: Self-pay

## 2023-08-23 DIAGNOSIS — F411 Generalized anxiety disorder: Secondary | ICD-10-CM

## 2023-08-24 ENCOUNTER — Other Ambulatory Visit (HOSPITAL_COMMUNITY): Payer: Self-pay

## 2023-08-27 ENCOUNTER — Other Ambulatory Visit (HOSPITAL_COMMUNITY): Payer: Self-pay

## 2023-08-27 MED ORDER — ALPRAZOLAM 1 MG PO TABS
1.0000 mg | ORAL_TABLET | Freq: Three times a day (TID) | ORAL | 1 refills | Status: DC | PRN
  Filled 2023-08-27: qty 90, 30d supply, fill #0
  Filled 2023-10-02: qty 90, 30d supply, fill #1

## 2023-08-29 ENCOUNTER — Ambulatory Visit: Payer: Self-pay

## 2023-08-29 ENCOUNTER — Other Ambulatory Visit (HOSPITAL_COMMUNITY): Payer: Self-pay

## 2023-08-29 ENCOUNTER — Ambulatory Visit: Admitting: Internal Medicine

## 2023-08-29 ENCOUNTER — Encounter: Payer: Self-pay | Admitting: Internal Medicine

## 2023-08-29 VITALS — BP 102/62 | HR 55 | Temp 98.0°F | Wt 127.6 lb

## 2023-08-29 DIAGNOSIS — R202 Paresthesia of skin: Secondary | ICD-10-CM | POA: Diagnosis not present

## 2023-08-29 DIAGNOSIS — E039 Hypothyroidism, unspecified: Secondary | ICD-10-CM | POA: Diagnosis not present

## 2023-08-29 DIAGNOSIS — M2550 Pain in unspecified joint: Secondary | ICD-10-CM

## 2023-08-29 DIAGNOSIS — R200 Anesthesia of skin: Secondary | ICD-10-CM

## 2023-08-29 NOTE — Progress Notes (Signed)
 Established Patient Office Visit     CC/Reason for Visit: Joint aches, hand and feet tingling and numbness  HPI: Tasha [name] is a 63 y.o. female who is coming in today for the above mentioned reasons. Past Medical History is significant for: Anxiety, depression, hypothyroidism, hypertension, hyperlipidemia, type 2 diabetes.  For the last few weeks has been noticing increased diffuse joint pains including her wrists, hands, knees, ankles.  Over the last 10 days or so she has also noticed some tingling of her hands and feet.   Past Medical/Surgical History: Past Medical History:  Diagnosis Date   Allergy    Anemia    past hx    Anxiety state, unspecified    Arthralgia of temporomandibular joint    Arthritis    Asthma    as a child , not now per pt    Conversion disorder    Dementia (HCC)    Depressive disorder, not elsewhere classified    Diabetes mellitus without complication (HCC)    off all medicines as of 04-16-2019 PV    Dysphagia, unspecified(787.20)    Endometriosis    Esophageal reflux    Fibromyalgia    Headache(784.0)    Heart murmur    past hx murmur    Hx of adenomatous colonic polyps    Hypertension 07/18/2020   Irritable bowel syndrome    Mitral valve disorders(424.0)    Myalgia and myositis, unspecified    Overweight(278.02)    Periodic limb movement disorder    Pure hypercholesterolemia    RLS (restless legs syndrome)    Seizures (HCC)    last episode 5 yrs ago    Stroke [facility])    TIA per pt many years ago    Type II diabetes mellitus with manifestations (HCC) 03/16/2022   Unspecified hypothyroidism     Past Surgical History:  Procedure Laterality Date   arm surgery     COLONOSCOPY  04/14/2008   FOOT SURGERY     LIPOMA EXCISION     BESIDES BELLY BUTTON   POLYPECTOMY     HPP x 1   TONSILLECTOMY     TOTAL ABDOMINAL HYSTERECTOMY     TAH   UPPER GASTROINTESTINAL ENDOSCOPY  2010   [facility]    WRIST SURGERY      Social History:   reports that she has been smoking cigarettes. She has a 10.8 pack-year smoking history. She has never used smokeless tobacco. She reports that she does not currently use alcohol. She reports that she does not use drugs.  Allergies: Allergies  Allergen Reactions   Farxiga  Decimus.Danas ] Other (See Comments)    Vag yeast inf   Metformin  And Related Diarrhea   Semaglutide (0.25 Or 0.5mg -Dos) Nausea And Vomiting   Statins Other (See Comments)    Muscle weakness/pains   Augmentin  [Amoxicillin -Pot Clavulanate] Other (See Comments)    Patient states she recently reported to dentist and was advised to report to providers as allergy.  States she got blisters under her eyes after taking Augmentin .   Lisinopril  Cough   Other Other (See Comments)    Family History:  Family History  Problem Relation Age of Onset   Heart disease Mother    Heart attack Mother    Heart disease Father    Diabetes Father    Colon cancer Father 16       died age 33 from colon cancer spreading per pt    Breast cancer Maternal Aunt    Colon  cancer Paternal Aunt    Stomach cancer Paternal Aunt    Lung cancer Paternal Aunt    Rectal cancer Paternal Aunt    Stomach cancer Paternal Uncle      Current Outpatient Medications:    albuterol  (VENTOLIN  HFA) 108 (90 Base) MCG/ACT inhaler, Inhale 2 puffs into the lungs every 6 (six) hours as needed for wheezing or shortness of breath, Disp: 6.7 g, Rfl: 3   ALPRAZolam  (XANAX ) 1 MG tablet, Take 1 tablet (1 mg) by mouth 3 times daily as needed for anxiety., Disp: 90 tablet, Rfl: 1   buPROPion  (WELLBUTRIN  XL) 150 MG 24 hr tablet, Take 1 tablet (150 mg total) by mouth daily., Disp: 90 tablet, Rfl: 0   ezetimibe  (ZETIA ) 10 MG tablet, Take 1 tablet (10 mg total) by mouth daily., Disp: 90 tablet, Rfl: 1   glucose blood (CONTOUR NEXT TEST) test strip, Use to test blood sugar daily as directed, Disp: 100 each, Rfl: 3   levothyroxine  (SYNTHROID ) 75 MCG tablet, Take 1 tablet (75 mcg  total) by mouth every morning., Disp: 90 tablet, Rfl: 3   lidocaine  (XYLOCAINE ) 2 % solution, Use as directed 15 mLs in the mouth or throat as needed for mouth pain., Disp: 100 mL, Rfl: 0   Microlet Lancets MISC, Use to test blood sugar daily as directed, Disp: 100 each, Rfl: 3   pravastatin  (PRAVACHOL ) 20 MG tablet, Take 1 tablet (20 mg total) by mouth daily., Disp: 90 tablet, Rfl: 1   pravastatin  (PRAVACHOL ) 20 MG tablet, Take 1 tablet (20 mg total) by mouth daily., Disp: 90 tablet, Rfl: 1   sertraline  (ZOLOFT ) 100 MG tablet, Take 1 tablet (100 mg total) by mouth daily., Disp: 90 tablet, Rfl: 0   tirzepatide  (MOUNJARO ) 7.5 MG/0.5ML Pen, Inject 7.5 mg into the skin once a week., Disp: 6 mL, Rfl: 1  Review of Systems:  Negative unless indicated in HPI.   Physical Exam: Vitals:   08/29/23 1528  BP: 102/62  Pulse: (!) 55  Temp: 98 F (36.7 C)  TempSrc: Oral  SpO2: 100%  Weight: 127 lb 9.6 oz (57.9 kg)    Body mass index is 20.75 kg/m.    Impression and Plan:  Arthralgia, unspecified joint  Acquired hypothyroidism -     TSH; Future  Tingling of both feet -     VITAMIN D  25 Hydroxy (Vit-D Deficiency, Fractures); Future -     Vitamin B12; Future -     TSH; Future  Numbness and tingling in both hands -     VITAMIN D  25 Hydroxy (Vit-D Deficiency, Fractures); Future -     Vitamin B12; Future -     TSH; Future   - Concerned about B12 deficiency and hypothyroidism.  Will check labs today.  Time spent:30 minutes reviewing chart, interviewing and examining patient and formulating plan of care.     [name], MD [facility]

## 2023-08-29 NOTE — Telephone Encounter (Signed)
 noted

## 2023-08-29 NOTE — Telephone Encounter (Signed)
 FYI Only or Action Required?: FYI only for provider.  Patient was last seen in primary care on 03/14/2023 by Tasha [name], Tully GRADE, MD.  Called Nurse Triage reporting Generalized Body Aches.  Symptoms began a week ago.  Interventions attempted: Rest, hydration, or home remedies.  Symptoms are: gradually worsening.  Triage Disposition: See Today in Office (overriding Call PCP Within 24 Hours)  Patient/caregiver understands and will follow disposition?:  Reason for Disposition  Diabetes mellitus or weak immune system (e.g., HIV positive, cancer chemo, splenectomy, organ transplant, chronic steroids)  Answer Assessment - Initial Assessment Questions 1. ONSET: When did the muscle aches or body pains start?      One week worse than baseline body aches  2. LOCATION: What part of your body is hurting? (e.g., entire body, arms, legs)      All joints, worse at the knees  3. SEVERITY: How bad is the pain? (Scale 1-10; or mild, moderate, severe)     8/10  5. FEVER: Do you have a fever? If Yes, ask: What is your temperature, how was it measured, and  when did it start?      No  Protocols used: Muscle Aches and Body Pain-A-AH Copied from CRM #[id number]. Topic: Clinical - Red Word Triage >> Aug 29, 2023 10:30 AM Henretta I wrote: Red Word that prompted transfer to Nurse Triage: Pain and weakness in joints. Also having  some feet pain and spotty red and flaky skin

## 2023-08-30 LAB — TSH: TSH: 8.19 u[IU]/mL — ABNORMAL HIGH (ref 0.35–5.50)

## 2023-08-30 LAB — VITAMIN D 25 HYDROXY (VIT D DEFICIENCY, FRACTURES): VITD: 36.8 ng/mL (ref 30.00–100.00)

## 2023-08-30 LAB — VITAMIN B12: Vitamin B-12: 457 pg/mL (ref 211–911)

## 2023-09-05 ENCOUNTER — Ambulatory Visit: Payer: Self-pay | Admitting: Internal Medicine

## 2023-09-21 ENCOUNTER — Other Ambulatory Visit (HOSPITAL_COMMUNITY): Payer: Self-pay

## 2023-09-21 MED ORDER — MOUNJARO 7.5 MG/0.5ML ~~LOC~~ SOAJ
7.5000 mg | SUBCUTANEOUS | 1 refills | Status: AC
  Filled 2023-09-21: qty 2, 28d supply, fill #0
  Filled 2023-09-21: qty 6, 84d supply, fill #0

## 2023-09-27 ENCOUNTER — Other Ambulatory Visit (HOSPITAL_COMMUNITY): Payer: Self-pay

## 2023-10-01 ENCOUNTER — Other Ambulatory Visit (HOSPITAL_COMMUNITY): Payer: Self-pay

## 2023-10-02 ENCOUNTER — Other Ambulatory Visit: Payer: Self-pay | Admitting: Internal Medicine

## 2023-10-02 ENCOUNTER — Other Ambulatory Visit (HOSPITAL_COMMUNITY): Payer: Self-pay

## 2023-10-02 DIAGNOSIS — F333 Major depressive disorder, recurrent, severe with psychotic symptoms: Secondary | ICD-10-CM

## 2023-10-02 DIAGNOSIS — F411 Generalized anxiety disorder: Secondary | ICD-10-CM

## 2023-10-03 ENCOUNTER — Other Ambulatory Visit (HOSPITAL_COMMUNITY): Payer: Self-pay

## 2023-10-03 ENCOUNTER — Other Ambulatory Visit: Payer: Self-pay

## 2023-10-03 MED ORDER — BUPROPION HCL ER (XL) 150 MG PO TB24
150.0000 mg | ORAL_TABLET | Freq: Every day | ORAL | 0 refills | Status: DC
  Filled 2023-10-03: qty 90, 90d supply, fill #0

## 2023-10-03 MED ORDER — SERTRALINE HCL 100 MG PO TABS
100.0000 mg | ORAL_TABLET | Freq: Every day | ORAL | 0 refills | Status: DC
  Filled 2023-10-03: qty 90, 90d supply, fill #0

## 2023-10-18 ENCOUNTER — Other Ambulatory Visit (INDEPENDENT_AMBULATORY_CARE_PROVIDER_SITE_OTHER)

## 2023-10-18 ENCOUNTER — Other Ambulatory Visit: Payer: Self-pay | Admitting: Internal Medicine

## 2023-10-18 DIAGNOSIS — E039 Hypothyroidism, unspecified: Secondary | ICD-10-CM

## 2023-10-18 LAB — TSH: TSH: 13.55 u[IU]/mL — ABNORMAL HIGH (ref 0.35–5.50)

## 2023-10-22 ENCOUNTER — Telehealth: Payer: Self-pay | Admitting: *Deleted

## 2023-10-22 NOTE — Telephone Encounter (Signed)
 Copied from CRM #[id number]. Topic: Clinical - Lab/Test Results >> Oct 19, 2023  1:05 PM Donna BRAVO wrote: Reason for CRM: patient calling asking about lab results and would like a call back from a nurse.   Patient was made aware their call will be returned in 1 business day.

## 2023-10-23 ENCOUNTER — Ambulatory Visit: Payer: Self-pay | Admitting: Internal Medicine

## 2023-10-23 DIAGNOSIS — E039 Hypothyroidism, unspecified: Secondary | ICD-10-CM

## 2023-10-24 ENCOUNTER — Other Ambulatory Visit: Payer: Self-pay | Admitting: Internal Medicine

## 2023-10-24 ENCOUNTER — Other Ambulatory Visit (HOSPITAL_COMMUNITY): Payer: Self-pay

## 2023-10-24 DIAGNOSIS — F411 Generalized anxiety disorder: Secondary | ICD-10-CM

## 2023-10-24 MED ORDER — LEVOTHYROXINE SODIUM 88 MCG PO TABS
88.0000 ug | ORAL_TABLET | Freq: Every day | ORAL | 3 refills | Status: AC
  Filled 2023-10-24: qty 90, 90d supply, fill #0

## 2023-10-24 NOTE — Telephone Encounter (Signed)
 Spoke to the patient and reviewed her lab results.

## 2023-10-25 ENCOUNTER — Other Ambulatory Visit (HOSPITAL_COMMUNITY): Payer: Self-pay

## 2023-10-25 MED ORDER — ALPRAZOLAM 1 MG PO TABS
1.0000 mg | ORAL_TABLET | Freq: Three times a day (TID) | ORAL | 1 refills | Status: DC | PRN
  Filled 2023-10-25 – 2023-10-31 (×2): qty 90, 30d supply, fill #0
  Filled 2023-12-05: qty 90, 30d supply, fill #1
  Filled ????-??-??: fill #0

## 2023-10-29 ENCOUNTER — Other Ambulatory Visit (HOSPITAL_COMMUNITY): Payer: Self-pay

## 2023-10-29 ENCOUNTER — Telehealth: Payer: Self-pay | Admitting: *Deleted

## 2023-10-29 NOTE — Telephone Encounter (Signed)
 Copied from CRM [id number]. Topic: General - Other >> Oct 26, 2023  8:34 AM Thersia BROCKS wrote: Reason for CRM: Patient called in wanted to know if Dr.Hernandez has some sort of device  monitoring her ?  Would like for her and her nurse to give her a callback

## 2023-10-29 NOTE — Telephone Encounter (Signed)
Attempted to call the patient but the voicemail box is full

## 2023-10-30 NOTE — Telephone Encounter (Signed)
 2nd attempt to call the patient, but the mailbox is full.

## 2023-10-31 ENCOUNTER — Telehealth: Payer: Self-pay | Admitting: *Deleted

## 2023-10-31 ENCOUNTER — Other Ambulatory Visit (HOSPITAL_COMMUNITY): Payer: Self-pay

## 2023-10-31 NOTE — Telephone Encounter (Signed)
 Copied from CRM #[id number]. Topic: General - Other >> Oct 31, 2023  1:16 PM Jasmin G wrote: Reason for CRM: Pt requested a call from one of Dr. Theophilus nurses to discuss some type RTT tracking on her phone, I apologize, I couldn't really understand what she was trying to get at, she did request a phone call back at [phone].   I have attempted to call the patient multiple times, but she does not answer, and her voicemail box is full.

## 2023-10-31 NOTE — Telephone Encounter (Signed)
 3rd attempt to call, but the mailbox is full.

## 2023-11-01 NOTE — Telephone Encounter (Signed)
 4th attempt to call the patient, but the mailbox is full.

## 2023-11-13 DIAGNOSIS — E119 Type 2 diabetes mellitus without complications: Secondary | ICD-10-CM | POA: Diagnosis not present

## 2023-11-13 DIAGNOSIS — Z008 Encounter for other general examination: Secondary | ICD-10-CM | POA: Diagnosis not present

## 2023-11-15 ENCOUNTER — Other Ambulatory Visit: Payer: Self-pay | Admitting: Internal Medicine

## 2023-11-15 NOTE — Telephone Encounter (Signed)
 Copied from CRM #[id number]. Topic: Clinical - Medication Refill >> Nov 15, 2023 11:04 AM Zy'onna H wrote: Medication:  Mounjaro  tirzepatide  (MOUNJARO ) 7.5 MG/0.5ML Pen  *Candis -  from [facility] called in on behalf of the patient* Has the patient contacted their pharmacy? Yes (Agent: If no, request that the patient contact the pharmacy for the refill. If patient does not wish to contact the pharmacy document the reason why and proceed with request.) (Agent: If yes, when and what did the pharmacy advise?)  This is the patient's preferred pharmacy:  [facility] [address] [place] [address] Phone: [phone] Fax: [phone]  Is this the correct pharmacy for this prescription? Yes If no, delete pharmacy and type the correct one.   Has the prescription been filled recently? Yes  Is the patient out of the medication? Yes  Has the patient been seen for an appointment in the last year OR does the patient have an upcoming appointment? Yes  Can we respond through MyChart? Yes  Agent: Please be advised that Rx refills may take up to 3 business days. We ask that you follow-up with your pharmacy.

## 2023-11-21 ENCOUNTER — Encounter: Payer: Self-pay | Admitting: Internal Medicine

## 2023-11-21 ENCOUNTER — Ambulatory Visit: Payer: Self-pay

## 2023-11-21 ENCOUNTER — Ambulatory Visit (INDEPENDENT_AMBULATORY_CARE_PROVIDER_SITE_OTHER): Admitting: Internal Medicine

## 2023-11-21 VITALS — BP 110/62 | HR 60 | Temp 98.2°F | Ht 67.0 in | Wt 127.2 lb

## 2023-11-21 DIAGNOSIS — J069 Acute upper respiratory infection, unspecified: Secondary | ICD-10-CM | POA: Diagnosis not present

## 2023-11-21 NOTE — Telephone Encounter (Signed)
 FYI Only or Action Required?: Action required by provider: request for appointment.  Patient was last seen in primary care on 08/29/2023 by Theophilus Andrews, Tully GRADE, MD.  Called Nurse Triage reporting Sinusitis.  Symptoms began several days ago.  Interventions attempted: OTC medications: nyquil.  Symptoms are: gradually worsening.  Triage Disposition: See Physician Within 24 Hours  Patient/caregiver understands and will follow disposition?: YesCopied from CRM #[id number]. Topic: Clinical - Red Word Triage >> Nov 21, 2023 12:56 PM Thersia BROCKS wrote: [facility] that prompted transfer to Nurse Triage: Patient has been sick since saturday, wanted to know if she can call her in something .SABRA Have headache, stopped up stated its in her head , green and red mucus Reason for Disposition  Earache  Answer Assessment - Initial Assessment Questions 1. LOCATION: Where does it hurt?      headache 2. ONSET: When did the sinus pain start?  (e.g., hours, days)      Saturday  3. SEVERITY: How bad is the pain?   (Scale 0-10; or none, mild, moderate or severe)     denies 4. RECURRENT SYMPTOM: Have you ever had sinus problems before? If Yes, ask: When was the last time? and What happened that time?      na 5. NASAL CONGESTION: Is the nose blocked? If Yes, ask: Can you open it or must you breathe through your mouth?     yes 6. NASAL DISCHARGE: Do you have discharge from your nose? If so ask, What color?     Green bloody mucus 7. FEVER: Do you have a fever? If Yes, ask: What is it, how was it measured, and when did it start?      denies 8. OTHER SYMPTOMS: Do you have any other symptoms? (e.g., sore throat, cough, earache, difficulty breathing) Sore throat, earache  Protocols used: Sinus Pain or Congestion-A-AH

## 2023-11-21 NOTE — Progress Notes (Signed)
 Established Patient Office Visit     CC/Reason for Visit: URI symptoms  HPI: Tasha GIAMMONA is a 63 y.o. female who is coming in today for the above mentioned reasons.  For the past 6 days has been experiencing , runny nose, headache, subjective fever and chills, sore throat, congestion.  No sick contacts.   Past Medical/Surgical History: Past Medical History:  Diagnosis Date   Allergy    Anemia    past hx    Anxiety state, unspecified    Arthralgia of temporomandibular joint    Arthritis    Asthma    as a child , not now per pt    Conversion disorder    Dementia (HCC)    Depressive disorder, not elsewhere classified    Diabetes mellitus without complication (HCC)    off all medicines as of 04-16-2019 PV    Dysphagia, unspecified(787.20)    Endometriosis    Esophageal reflux    Fibromyalgia    Headache(784.0)    Heart murmur    past hx murmur    Hx of adenomatous colonic polyps    Hypertension 07/18/2020   Irritable bowel syndrome    Mitral valve disorders(424.0)    Myalgia and myositis, unspecified    Overweight(278.02)    Periodic limb movement disorder    Pure hypercholesterolemia    RLS (restless legs syndrome)    Seizures (HCC)    last episode 5 yrs ago    Stroke [facility])    TIA per pt many years ago    Type II diabetes mellitus with manifestations (HCC) 03/16/2022   Unspecified hypothyroidism     Past Surgical History:  Procedure Laterality Date   arm surgery     COLONOSCOPY  04/14/2008   FOOT SURGERY     LIPOMA EXCISION     BESIDES BELLY BUTTON   POLYPECTOMY     HPP x 1   TONSILLECTOMY     TOTAL ABDOMINAL HYSTERECTOMY     TAH   UPPER GASTROINTESTINAL ENDOSCOPY  2010   [facility]ner    WRIST SURGERY      Social History:  reports that she has been smoking cigarettes. She has a 10.8 pack-year smoking history. She has never used smokeless tobacco. She reports that she does not currently use alcohol. She reports that she does not use  drugs.  Allergies: Allergies  Allergen Reactions   Farxiga  [Dapagliflozin ] Other (See Comments)    Vag yeast inf   Metformin  And Related Diarrhea   Semaglutide (0.25 Or 0.5mg -Dos) Nausea And Vomiting   Statins Other (See Comments)    Muscle weakness/pains   Augmentin  [Amoxicillin -Pot Clavulanate] Other (See Comments)    Patient states she recently reported to dentist and was advised to report to providers as allergy.  States she got blisters under her eyes after taking Augmentin .   Lisinopril  Cough   Other Other (See Comments)    Family History:  Family History  Problem Relation Age of Onset   Heart disease Mother    Heart attack Mother    Heart disease Father    Diabetes Father    Colon cancer Father 52       died age 28 from colon cancer spreading per pt    Breast cancer Maternal Aunt    Colon cancer Paternal Aunt    Stomach cancer Paternal Aunt    Lung cancer Paternal Aunt    Rectal cancer Paternal Aunt    Stomach cancer Paternal Uncle  Current Outpatient Medications:    albuterol  (VENTOLIN  HFA) 108 (90 Base) MCG/ACT inhaler, Inhale 2 puffs into the lungs every 6 (six) hours as needed for wheezing or shortness of breath, Disp: 6.7 g, Rfl: 3   ALPRAZolam  (XANAX ) 1 MG tablet, Take 1 tablet (1 mg) by mouth 3 times daily as needed for anxiety., Disp: 90 tablet, Rfl: 1   buPROPion  (WELLBUTRIN  XL) 150 MG 24 hr tablet, Take 1 tablet (150 mg total) by mouth daily., Disp: 90 tablet, Rfl: 0   ezetimibe  (ZETIA ) 10 MG tablet, Take 1 tablet (10 mg total) by mouth daily., Disp: 90 tablet, Rfl: 1   glucose blood (CONTOUR NEXT TEST) test strip, Use to test blood sugar daily as directed, Disp: 100 each, Rfl: 3   levothyroxine  (SYNTHROID ) 88 MCG tablet, Take 1 tablet (88 mcg total) by mouth daily., Disp: 90 tablet, Rfl: 3   lidocaine  (XYLOCAINE ) 2 % solution, Use as directed 15 mLs in the mouth or throat as needed for mouth pain., Disp: 100 mL, Rfl: 0   Microlet Lancets MISC, Use to  test blood sugar daily as directed, Disp: 100 each, Rfl: 3   pravastatin  (PRAVACHOL ) 20 MG tablet, Take 1 tablet (20 mg total) by mouth daily., Disp: 90 tablet, Rfl: 1   pravastatin  (PRAVACHOL ) 20 MG tablet, Take 1 tablet (20 mg total) by mouth daily., Disp: 90 tablet, Rfl: 1   sertraline  (ZOLOFT ) 100 MG tablet, Take 1 tablet (100 mg total) by mouth daily., Disp: 90 tablet, Rfl: 0   tirzepatide  (MOUNJARO ) 7.5 MG/0.5ML Pen, Inject 7.5 mg into the skin once a week., Disp: 6 mL, Rfl: 1   tirzepatide  (MOUNJARO ) 7.5 MG/0.5ML Pen, Inject 0.5 mL (7.5 mg total) under the skin every 7 days., Disp: 6 mL, Rfl: 1  Review of Systems:  Negative unless indicated in HPI.   Physical Exam: Vitals:   11/21/23 1439  BP: 110/62  Pulse: 60  Temp: 98.2 F (36.8 C)  TempSrc: Oral  SpO2: 99%  Weight: 127 lb 3.2 oz (57.7 kg)  Height: 5' 7 (1.702 m)    Body mass index is 19.92 kg/m.   Physical Exam Vitals reviewed.  Constitutional:      Appearance: Normal appearance.  HENT:     Right Ear: Tympanic membrane, ear canal and external ear normal.     Left Ear: Tympanic membrane, ear canal and external ear normal.     Mouth/Throat:     Mouth: Mucous membranes are moist.     Pharynx: Posterior oropharyngeal erythema present.  Eyes:     Conjunctiva/sclera: Conjunctivae normal.     Pupils: Pupils are equal, round, and reactive to light.  Cardiovascular:     Rate and Rhythm: Normal rate and regular rhythm.  Pulmonary:     Effort: Pulmonary effort is normal.     Breath sounds: Normal breath sounds.  Neurological:     Mental Status: She is alert.      Impression and Plan:  Viral URI   -Given exam findings, PNA, pharyngitis, ear infection are not likely, hence abx have not been prescribed. -Have advised rest, fluids, OTC antihistamines, cough suppressants and mucinex. -RTC if no improvement in 10-14 days. - Since symptoms present for 6 days, no flu or COVID test ordered.  Time spent:22 minutes  reviewing chart, interviewing and examining patient and formulating plan of care.     [name], MD [facility]

## 2023-11-26 ENCOUNTER — Telehealth: Payer: Self-pay | Admitting: *Deleted

## 2023-11-26 NOTE — Telephone Encounter (Signed)
 Patient was seen 11/21/2023

## 2023-11-26 NOTE — Telephone Encounter (Signed)
 Attempted to call the patient, but the mailbox is full and I can not leave a message.

## 2023-11-26 NOTE — Telephone Encounter (Signed)
 Copied from CRM #[id number]. Topic: Clinical - Medical Advice >> Nov 26, 2023  8:37 AM Tasha [name] wrote: Reason for CRM: Patient stated she isnt feeling any better, wanted to know if a antibiotic could be called in ,still having the green mucus  Would like to be call on [phone]  [facility] - [facility] [address] [place] [address] Phone: [phone] Fax: [phone] Hours: Mon-Fri 7:30a-7p; Sat 8a-4:30p; [place] 10a-2p

## 2023-11-27 NOTE — Telephone Encounter (Signed)
 2nd attempt to call the patient but the mailbox is full.

## 2023-11-29 NOTE — Telephone Encounter (Signed)
 3 rd attempt to call the patient but unable to leave a message.  Message sent via Mychart.

## 2023-12-05 ENCOUNTER — Other Ambulatory Visit

## 2023-12-05 ENCOUNTER — Other Ambulatory Visit (HOSPITAL_COMMUNITY): Payer: Self-pay

## 2023-12-05 MED ORDER — ONETOUCH DELICA PLUS LANCET33G MISC
3 refills | Status: AC
  Filled 2023-12-05: qty 100, 100d supply, fill #0

## 2023-12-05 MED ORDER — MOUNJARO 7.5 MG/0.5ML ~~LOC~~ SOAJ
SUBCUTANEOUS | 1 refills | Status: AC
  Filled 2023-12-05: qty 6, 84d supply, fill #0
  Filled 2024-01-10: qty 4, 56d supply, fill #0
  Filled 2024-01-10: qty 6, 84d supply, fill #0
  Filled 2024-01-10: qty 2, 28d supply, fill #0

## 2023-12-05 MED ORDER — ONETOUCH VERIO FLEX SYSTEM W/DEVICE KIT
PACK | 0 refills | Status: AC
  Filled 2023-12-05: qty 1, 30d supply, fill #0

## 2023-12-05 MED ORDER — ONETOUCH VERIO VI STRP
ORAL_STRIP | 3 refills | Status: AC
  Filled 2023-12-05: qty 100, 100d supply, fill #0

## 2023-12-06 ENCOUNTER — Other Ambulatory Visit (HOSPITAL_COMMUNITY): Payer: Self-pay

## 2023-12-06 ENCOUNTER — Other Ambulatory Visit

## 2023-12-07 ENCOUNTER — Other Ambulatory Visit (HOSPITAL_COMMUNITY): Payer: Self-pay

## 2023-12-12 ENCOUNTER — Other Ambulatory Visit (HOSPITAL_COMMUNITY): Payer: Self-pay

## 2023-12-12 MED ORDER — LEVOTHYROXINE SODIUM 100 MCG PO TABS
100.0000 ug | ORAL_TABLET | Freq: Every morning | ORAL | 3 refills | Status: AC
  Filled 2023-12-12: qty 90, 90d supply, fill #0

## 2023-12-14 ENCOUNTER — Ambulatory Visit

## 2023-12-14 ENCOUNTER — Other Ambulatory Visit (HOSPITAL_COMMUNITY): Payer: Self-pay

## 2023-12-17 ENCOUNTER — Ambulatory Visit (INDEPENDENT_AMBULATORY_CARE_PROVIDER_SITE_OTHER)

## 2023-12-17 VITALS — Ht 67.0 in | Wt 126.6 lb

## 2023-12-17 DIAGNOSIS — Z0000 Encounter for general adult medical examination without abnormal findings: Secondary | ICD-10-CM | POA: Diagnosis not present

## 2023-12-17 NOTE — Progress Notes (Signed)
 Subjective:   Tasha [name] is a 63 y.o. female who presents for a Medicare Annual Wellness Visit.  Allergies (verified) Farxiga  [dapagliflozin ], Metformin  and related, Semaglutide (0.25 or 0.5mg -dos), Statins, Augmentin  [amoxicillin -pot clavulanate], Lisinopril , and Other   History: Past Medical History:  Diagnosis Date   Allergy    Anemia    past hx    Anxiety state, unspecified    Arthralgia of temporomandibular joint    Arthritis    Asthma    as a child , not now per pt    Conversion disorder    Dementia (HCC)    Depressive disorder, not elsewhere classified    Diabetes mellitus without complication (HCC)    off all medicines as of 04-16-2019 PV    Dysphagia, unspecified(787.20)    Endometriosis    Esophageal reflux    Fibromyalgia    Headache(784.0)    Heart murmur    past hx murmur    Hx of adenomatous colonic polyps    Hypertension 07/18/2020   Irritable bowel syndrome    Mitral valve disorders(424.0)    Myalgia and myositis, unspecified    Overweight(278.02)    Periodic limb movement disorder    Pure hypercholesterolemia    RLS (restless legs syndrome)    Seizures (HCC)    last episode 5 yrs ago    Stroke [facility])    TIA per pt many years ago    Type II diabetes mellitus with manifestations (HCC) 03/16/2022   Unspecified hypothyroidism    Past Surgical History:  Procedure Laterality Date   arm surgery     COLONOSCOPY  04/14/2008   FOOT SURGERY     LIPOMA EXCISION     BESIDES BELLY BUTTON   POLYPECTOMY     HPP x 1   TONSILLECTOMY     TOTAL ABDOMINAL HYSTERECTOMY     TAH   UPPER GASTROINTESTINAL ENDOSCOPY  2010   [facility]    WRIST SURGERY     Family History  Problem Relation Age of Onset   Heart disease Mother    Heart attack Mother    Heart disease Father    Diabetes Father    Colon cancer Father 107       died age 45 from colon cancer spreading per pt    Breast cancer Maternal Aunt    Colon cancer Paternal Aunt    Stomach cancer Paternal  Aunt    Lung cancer Paternal Aunt    Rectal cancer Paternal Aunt    Stomach cancer Paternal Uncle    Social History   Occupational History   Occupation: Disabled    Employer: [employer]  Tobacco Use   Smoking status: Some Days    Current packs/day: 0.25    Average packs/day: 0.3 packs/day for 43.0 years (10.8 ttl pk-yrs)    Types: Cigarettes   Smokeless tobacco: Never  Vaping Use   Vaping status: Former   Substances: Nicotine, Flavoring  Substance and Sexual Activity   Alcohol use: Not Currently   Drug use: No   Sexual activity: Yes    Partners: Male    Birth control/protection: Surgical    Comment: hysterectomy   Tobacco Counseling Ready to quit: Not Answered Counseling given: Not Answered  SDOH Screenings   Food Insecurity: No Food Insecurity (12/17/2023)  Housing: Unknown (12/17/2023)  Transportation Needs: No Transportation Needs (09/21/2022)  Utilities: Not At Risk (12/17/2023)  Alcohol Screen: Low Risk  (08/22/2021)  Depression (PHQ2-9): Medium Risk (12/17/2023)  Financial Resource Strain: Low Risk  (  09/21/2022)  Physical Activity: Inactive (12/17/2023)  Social Connections: Unknown (12/17/2023)  Stress: No Stress Concern Present (12/17/2023)  Tobacco Use: High Risk (12/17/2023)  Health Literacy: Adequate Health Literacy (12/17/2023)   Depression Screen    12/17/2023   11:08 AM 08/29/2023    3:29 PM 03/14/2023   11:12 AM 02/13/2023    2:19 PM 10/26/2022    3:24 PM 09/21/2022    1:45 PM 06/19/2022    8:13 AM  PHQ 2/9 Scores  PHQ - 2 Score 3 2 2  0 0 0 0  PHQ- 9 Score 8 11  13     0  0      Data saved with a previous flowsheet row definition     Goals Addressed             This Visit's Progress    Client understands the importance of follow-up with providers by attending scheduled visits   On track      Visit info / Clinical Intake: Medicare Wellness Visit Type:: Subsequent Annual Wellness Visit Persons participating in visit:: patient Medicare  Wellness Visit Mode:: Telephone If telephone:: video declined Because this visit was a virtual/telehealth visit:: pt reported vitals If Telephone or Video please confirm:: I discussed the limitations of evaluation and management by telemedicine; I connected with the patient using audio enabled telemedicine application and verified that I am speaking with the correct person using two identifiers; The patient expressed understanding and agreed to proceed Patient Location:: home Provider Location:: home Information given by:: patient Interpreter Needed?: No Pre-visit prep was completed: no AWV questionnaire completed by patient prior to visit?: no Living arrangements:: lives with spouse/significant other Patient's Overall Health Status Rating: (!) fair Typical amount of pain: some (knees and neck) Does pain affect daily life?: (!) yes Are you currently prescribed opioids?: no  Dietary Habits and Nutritional Risks How many meals a day?: 2 Eats fruit and vegetables daily?: yes Most meals are obtained by: preparing own meals; eating out In the last 2 weeks, have you had any of the following?: none Diabetic:: (!) yes Any non-healing wounds?: no How often do you check your BS?: as needed Would you like to be referred to a Nutritionist or for Diabetic Management? : no  Functional Status Activities of Daily Living (to include ambulation/medication): Independent Ambulation: Independent with device- listed below Home Assistive Devices/Equipment: Eyeglasses Medication Administration: Independent Home Management: Independent Manage your own finances?: yes Primary transportation is: driving Concerns about vision?: no *vision screening is required for WTM* ([place]/per pt UTD) Concerns about hearing?: no  Fall Screening Falls in the past year?: 0 Number of falls in past year: 0 Was there an injury with Fall?: 0 Fall Risk Category Calculator: 0 Patient Fall Risk Level: Low Fall Risk  Fall  Risk Patient at Risk for Falls Due to: No Fall Risks Fall risk Follow up: Falls evaluation completed; Falls prevention discussed  Home and Transportation Safety: All rugs have non-skid backing?: yes All stairs or steps have railings?: yes Grab bars in the bathtub or shower?: yes Have non-skid surface in bathtub or shower?: yes Good home lighting?: yes Regular seat belt use?: yes Hospital stays in the last year:: no  Cognitive Assessment Difficulty concentrating, remembering, or making decisions? : no Will 6CIT or Mini Cog be Completed: no 6CIT or Mini Cog Declined: patient declined  Advance Directives (For Healthcare) Does Patient Have a Medical Advance Directive?: No Would patient like information on creating a medical advance directive?: No - Patient declined  Reviewed/Updated  Reviewed/Updated: Reviewed All (Medical, Surgical, Family, Medications, Allergies, Care Teams, Patient Goals)        Objective:    Today's Vitals   12/17/23 1053  Weight: 126 lb 9.6 oz (57.4 kg)  Height: 5' 7 (1.702 m)   Body mass index is 19.83 kg/m.  Current Medications (verified) Outpatient Encounter Medications as of 12/17/2023  Medication Sig   albuterol  (VENTOLIN  HFA) 108 (90 Base) MCG/ACT inhaler Inhale 2 puffs into the lungs every 6 (six) hours as needed for wheezing or shortness of breath   ALPRAZolam  (XANAX ) 1 MG tablet Take 1 tablet (1 mg) by mouth 3 times daily as needed for anxiety.   Blood Glucose Monitoring Suppl (ONETOUCH VERIO FLEX SYSTEM) w/Device KIT Use daily as instructed.   buPROPion  (WELLBUTRIN  XL) 150 MG 24 hr tablet Take 1 tablet (150 mg total) by mouth daily.   ezetimibe  (ZETIA ) 10 MG tablet Take 1 tablet (10 mg total) by mouth daily.   glucose blood (CONTOUR NEXT TEST) test strip Use to test blood sugar daily as directed   glucose blood (ONETOUCH VERIO) test strip Use 1 time daily as instructed   Lancets (ONETOUCH DELICA PLUS LANCET33G) MISC Use one lancet daily.    levothyroxine  (SYNTHROID ) 100 MCG tablet Take 1 tablet (100 mcg total) by mouth every morning.   lidocaine  (XYLOCAINE ) 2 % solution Use as directed 15 mLs in the mouth or throat as needed for mouth pain.   Microlet Lancets MISC Use to test blood sugar daily as directed   pravastatin  (PRAVACHOL ) 20 MG tablet Take 1 tablet (20 mg total) by mouth daily.   sertraline  (ZOLOFT ) 100 MG tablet Take 1 tablet (100 mg total) by mouth daily.   tirzepatide  (MOUNJARO ) 7.5 MG/0.5ML Pen Inject 0.5 mL (7.5 mg total) under the skin every 7 days.   levothyroxine  (SYNTHROID ) 88 MCG tablet Take 1 tablet (88 mcg total) by mouth daily. (Patient not taking: Reported on 12/17/2023)   pravastatin  (PRAVACHOL ) 20 MG tablet Take 1 tablet (20 mg total) by mouth daily.   tirzepatide  (MOUNJARO ) 7.5 MG/0.5ML Pen Inject 7.5 mg into the skin once a week.   tirzepatide  (MOUNJARO ) 7.5 MG/0.5ML Pen Inject 0.5 mL (7.5 mg total) under the skin every 7 days.   [DISCONTINUED] ARIPiprazole  (ABILIFY ) 2 MG tablet Take 1 tablet (2 mg total) by mouth daily.   [DISCONTINUED] divalproex  (DEPAKOTE ) 500 MG DR tablet Take 1 tablet (500 mg total) by mouth 2 (two) times daily.   No facility-administered encounter medications on file as of 12/17/2023.   Hearing/Vision screen Hearing Screening - Comments:: Denies hearing difficulties   Vision Screening - Comments:: Wears eyeglasses/Per pt-Not up todate Immunizations and Health Maintenance Health Maintenance  Topic Date Due   Diabetic kidney evaluation - Urine ACR  Never done   Influenza Vaccine  09/07/2023   Diabetic kidney evaluation - eGFR measurement  10/26/2023   FOOT EXAM  10/26/2023   OPHTHALMOLOGY EXAM  11/28/2023   Mammogram  03/22/2024   HEMOGLOBIN A1C  06/04/2024   Medicare Annual Wellness (AWV)  12/16/2024   Colonoscopy  05/15/2028   DTaP/Tdap/Td (3 - Td or Tdap) 09/20/2032   Pneumococcal Vaccine: 50+ Years  Completed   Hepatitis B Vaccines 19-59 Average Risk  Completed    Hepatitis C Screening  Completed   HIV Screening  Completed   Zoster Vaccines- Shingrix   Completed   HPV VACCINES  Aged Out   Meningococcal B Vaccine  Aged Out   Lung Cancer Screening  Discontinued  COVID-19 Vaccine  Discontinued        Assessment/Plan:  This is a routine wellness examination for [place].  Patient Care Team: Theophilus Andrews, Tully GRADE, MD as PCP - General (Internal Medicine) Camillo Golas, MD as Attending Physician (Ophthalmology)  I have personally reviewed and noted the following in the patient's chart:   Medical and social history Use of alcohol, tobacco or illicit drugs  Current medications and supplements including opioid prescriptions. Functional ability and status Nutritional status Physical activity Advanced directives List of other physicians Hospitalizations, surgeries, and ER visits in previous 12 months Vitals Screenings to include cognitive, depression, and falls Referrals and appointments  No orders of the defined types were placed in this encounter.  In addition, I have reviewed and discussed with patient certain preventive protocols, quality metrics, and best practice recommendations. A written personalized care plan for preventive services as well as general preventive health recommendations were provided to patient.   Clearence Vitug L Maely Clements, CMA   12/17/2023   Return in 1 year (on 12/16/2024).  After Visit Summary: (MyChart) Due to this being a telephonic visit, the after visit summary with patients personalized plan was offered to patient via MyChart   Nurse Notes: Patient had a recent visit with Endo, Dr. Tobie and had a A1c check, a UACR and a eGFR done during her office visit on 12/05/2023, (please see chart).  Patient is due for a flu vaccine.  She also is requesting  a medication to help with sleep.  Patient stated that she has not been able to sleep well since before her last visit with Dr. Theophilus, which was on 11/21/2023.  I informed  patient that I would document in my note today (Please advise).  Patient also stated that she would like to see/speak with a psychiatrist, due to her depression and other issues going on in her life, (please advise).  Patient next office visit os on 03/19/2023, however she would like Dr. Theophilus to contact her before that time.

## 2023-12-17 NOTE — Patient Instructions (Addendum)
 Tasha [name],  Thank you for taking the time for your Medicare Wellness Visit. I appreciate your continued commitment to your health goals. Please review the care plan we discussed, and feel free to reach out if I can assist you further.  Please note that Annual Wellness Visits do not include a physical exam. Some assessments may be limited, especially if the visit was conducted virtually. If needed, we may recommend an in-person follow-up with your provider.  Ongoing Care Seeing your primary care provider every 3 to 6 months helps us  monitor your health and provide consistent, personalized care. Next office visit on 03/19/2023.  You are due for  Referrals If a referral was made during today's visit and you haven't received any updates within two weeks, please contact the referred provider directly to check on the status.  Recommended Screenings:  Health Maintenance  Topic Date Due   Yearly kidney health urinalysis for diabetes  Never done   Hemoglobin A1C  03/24/2023   Flu Shot  09/07/2023   Medicare Annual Wellness Visit  09/21/2023   Yearly kidney function blood test for diabetes  10/26/2023   Complete foot exam   10/26/2023   Eye exam for diabetics  11/28/2023   Breast Cancer Screening  03/22/2024   Colon Cancer Screening  05/15/2028   DTaP/Tdap/Td vaccine (3 - Td or Tdap) 09/20/2032   Pneumococcal Vaccine for age over 68  Completed   Hepatitis B Vaccine  Completed   Hepatitis C Screening  Completed   HIV Screening  Completed   Zoster (Shingles) Vaccine  Completed   HPV Vaccine  Aged Out   Meningitis B Vaccine  Aged Out   Screening for Lung Cancer  Discontinued   COVID-19 Vaccine  Discontinued       12/17/2023   10:58 AM  Advanced Directives  Does Patient Have a Medical Advance Directive? No  Would patient like information on creating a medical advance directive? No - Patient declined    Vision: Annual vision screenings are recommended for early detection of glaucoma,  cataracts, and diabetic retinopathy. These exams can also reveal signs of chronic conditions such as diabetes and high blood pressure.  Dental: Annual dental screenings help detect early signs of oral cancer, gum disease, and other conditions linked to overall health, including heart disease and diabetes.  Please see the attached documents for additional preventive care recommendations.

## 2023-12-19 ENCOUNTER — Encounter: Payer: Self-pay | Admitting: Acute Care

## 2023-12-19 ENCOUNTER — Telehealth: Payer: Self-pay | Admitting: *Deleted

## 2023-12-19 NOTE — Telephone Encounter (Signed)
 Copied from CRM [id number]. Topic: General - Other >> Dec 19, 2023  3:39 PM Macario HERO wrote: Reason for CRM: Patient is requesting a call back from provider or her nurse regarding who keeps calling her stating someone has power of administration over her about some mental issue. She is requesting a call back from nurse for clarity.

## 2023-12-19 NOTE — Telephone Encounter (Signed)
 Attempted to call the patient, but the mailbox is full and I can not leave a message.  We have not called Tasha [name] stating someone has power of administration over her about some mental issue.

## 2023-12-25 ENCOUNTER — Other Ambulatory Visit: Payer: Self-pay | Admitting: Internal Medicine

## 2023-12-25 ENCOUNTER — Other Ambulatory Visit (HOSPITAL_COMMUNITY): Payer: Self-pay

## 2023-12-25 ENCOUNTER — Other Ambulatory Visit: Payer: Self-pay

## 2023-12-25 MED ORDER — PRAVASTATIN SODIUM 20 MG PO TABS
20.0000 mg | ORAL_TABLET | Freq: Every day | ORAL | 1 refills | Status: AC
  Filled 2023-12-25 – 2024-02-12 (×3): qty 90, 90d supply, fill #0

## 2023-12-25 NOTE — Telephone Encounter (Signed)
 Copied from CRM #[id number]. Topic: Clinical - Medication Refill >> Dec 25, 2023  9:52 AM Nessti S wrote: Medication: pravastatin  (PRAVACHOL ) 20 MG tablet; 100 day supply  Has the patient contacted their pharmacy? Yes (Agent: If no, request that the patient contact the pharmacy for the refill. If patient does not wish to contact the pharmacy document the reason why and proceed with request.) (Agent: If yes, when and what did the pharmacy advise?)  This is the patient's preferred pharmacy:  [facility] [address] [place] [address] Phone: [phone] Fax: [phone]  Is this the correct pharmacy for this prescription? Yes If no, delete pharmacy and type the correct one.   Has the prescription been filled recently? No  Is the patient out of the medication? No. Has few days left  Has the patient been seen for an appointment in the last year OR does the patient have an upcoming appointment? Yes  Can we respond through MyChart? No  Agent: Please be advised that Rx refills may take up to 3 business days. We ask that you follow-up with your pharmacy.

## 2024-01-04 ENCOUNTER — Other Ambulatory Visit (HOSPITAL_COMMUNITY): Payer: Self-pay

## 2024-01-05 ENCOUNTER — Other Ambulatory Visit (HOSPITAL_COMMUNITY): Payer: Self-pay

## 2024-01-05 ENCOUNTER — Other Ambulatory Visit: Payer: Self-pay | Admitting: Internal Medicine

## 2024-01-05 DIAGNOSIS — F411 Generalized anxiety disorder: Secondary | ICD-10-CM

## 2024-01-05 DIAGNOSIS — F333 Major depressive disorder, recurrent, severe with psychotic symptoms: Secondary | ICD-10-CM

## 2024-01-05 MED ORDER — EZETIMIBE 10 MG PO TABS
10.0000 mg | ORAL_TABLET | Freq: Every day | ORAL | 1 refills | Status: AC
  Filled 2024-01-05: qty 90, 90d supply, fill #0

## 2024-01-06 ENCOUNTER — Other Ambulatory Visit (HOSPITAL_COMMUNITY): Payer: Self-pay

## 2024-01-07 ENCOUNTER — Other Ambulatory Visit: Payer: Self-pay

## 2024-01-07 ENCOUNTER — Other Ambulatory Visit (HOSPITAL_COMMUNITY): Payer: Self-pay

## 2024-01-07 MED ORDER — ALPRAZOLAM 1 MG PO TABS
1.0000 mg | ORAL_TABLET | Freq: Three times a day (TID) | ORAL | 1 refills | Status: AC | PRN
  Filled 2024-01-07: qty 90, 30d supply, fill #0
  Filled 2024-02-29: qty 90, 30d supply, fill #1

## 2024-01-07 MED ORDER — SERTRALINE HCL 100 MG PO TABS
100.0000 mg | ORAL_TABLET | Freq: Every day | ORAL | 0 refills | Status: AC
  Filled 2024-01-07: qty 90, 90d supply, fill #0

## 2024-01-07 MED ORDER — BUPROPION HCL ER (XL) 150 MG PO TB24
150.0000 mg | ORAL_TABLET | Freq: Every day | ORAL | 0 refills | Status: AC
  Filled 2024-01-07: qty 90, 90d supply, fill #0

## 2024-01-08 NOTE — Progress Notes (Signed)
 Pharmacy Quality Measure Review  This patient is appearing on a report for being at risk of failing the adherence measure for diabetes medications this calendar year.   Medication: Mounjaro  7.5 mg Last fill date: 01/05/24 for 28 day supply  Insurance report was not up to date. No action needed at this time.    Woodie Jock, PharmD PGY1 Pharmacy Resident  01/08/2024

## 2024-01-10 ENCOUNTER — Other Ambulatory Visit (HOSPITAL_COMMUNITY): Payer: Self-pay

## 2024-01-11 ENCOUNTER — Other Ambulatory Visit (HOSPITAL_COMMUNITY): Payer: Self-pay

## 2024-01-11 ENCOUNTER — Other Ambulatory Visit: Payer: Self-pay

## 2024-02-12 ENCOUNTER — Encounter: Payer: Self-pay | Admitting: Internal Medicine

## 2024-02-12 ENCOUNTER — Ambulatory Visit: Admitting: Internal Medicine

## 2024-02-12 ENCOUNTER — Other Ambulatory Visit (HOSPITAL_COMMUNITY): Payer: Self-pay

## 2024-02-12 VITALS — BP 83/58 | HR 60 | Temp 98.2°F | Wt 125.7 lb

## 2024-02-12 DIAGNOSIS — R0981 Nasal congestion: Secondary | ICD-10-CM

## 2024-02-12 DIAGNOSIS — J069 Acute upper respiratory infection, unspecified: Secondary | ICD-10-CM

## 2024-02-12 DIAGNOSIS — R52 Pain, unspecified: Secondary | ICD-10-CM

## 2024-02-12 LAB — POCT INFLUENZA A/B
Influenza A, POC: NEGATIVE
Influenza B, POC: NEGATIVE

## 2024-02-12 LAB — POC COVID19 BINAXNOW: SARS Coronavirus 2 Ag: NEGATIVE

## 2024-02-12 NOTE — Progress Notes (Signed)
 "    Established Patient Office Visit     CC/Reason for Visit: URI symptoms  HPI: Tasha [name] is a 64 y.o. female who is coming in today for the above mentioned reasons.  For the past 2-1/2 days has been dealing with runny nose, congestion, sinus pain and pressure, headaches, dizziness, congestion.  Some of her grandchildren have been diagnosed with the flu.   Past Medical/Surgical History: Past Medical History:  Diagnosis Date   Allergy    Anemia    past hx    Anxiety state, unspecified    Arthralgia of temporomandibular joint    Arthritis    Asthma    as a child , not now per pt    Conversion disorder    Dementia (HCC)    Depressive disorder, not elsewhere classified    Diabetes mellitus without complication (HCC)    off all medicines as of 04-16-2019 PV    Dysphagia, unspecified(787.20)    Endometriosis    Esophageal reflux    Fibromyalgia    Headache(784.0)    Heart murmur    past hx murmur    Hx of adenomatous colonic polyps    Hypertension 07/18/2020   Irritable bowel syndrome    Mitral valve disorders(424.0)    Myalgia and myositis, unspecified    Overweight(278.02)    Periodic limb movement disorder    Pure hypercholesterolemia    RLS (restless legs syndrome)    Seizures (HCC)    last episode 5 yrs ago    Stroke [facility])    TIA per pt many years ago    Type II diabetes mellitus with manifestations (HCC) 03/16/2022   Unspecified hypothyroidism     Past Surgical History:  Procedure Laterality Date   arm surgery     COLONOSCOPY  04/14/2008   FOOT SURGERY     LIPOMA EXCISION     BESIDES BELLY BUTTON   POLYPECTOMY     HPP x 1   TONSILLECTOMY     TOTAL ABDOMINAL HYSTERECTOMY     TAH   UPPER GASTROINTESTINAL ENDOSCOPY  2010   [facility]    WRIST SURGERY      Social History:  reports that she has been smoking cigarettes. She has a 10.8 pack-year smoking history. She has never used smokeless tobacco. She reports that she does not currently use  alcohol. She reports that she does not use drugs.  Allergies: Allergies[1]  Family History:  Family History  Problem Relation Age of Onset   Heart disease Mother    Heart attack Mother    Heart disease Father    Diabetes Father    Colon cancer Father 71       died age 50 from colon cancer spreading per pt    Breast cancer Maternal Aunt    Colon cancer Paternal Aunt    Stomach cancer Paternal Aunt    Lung cancer Paternal Aunt    Rectal cancer Paternal Aunt    Stomach cancer Paternal Uncle     Current Medications[2]  Review of Systems:  Negative unless indicated in HPI.   Physical Exam: Vitals:   02/12/24 0752  BP: (!) 83/58  Pulse: 60  Temp: 98.2 F (36.8 C)  TempSrc: Oral  SpO2: 97%  Weight: 125 lb 11.2 oz (57 kg)    Body mass index is 19.69 kg/m.   Physical Exam Vitals reviewed.  Constitutional:      Appearance: Normal appearance.  HENT:     Right Ear: Tympanic  membrane, ear canal and external ear normal.     Left Ear: Ear canal and external ear normal. A middle ear effusion is present.     Mouth/Throat:     Mouth: Mucous membranes are moist.     Pharynx: Oropharynx is clear.  Eyes:     Conjunctiva/sclera: Conjunctivae normal.     Pupils: Pupils are equal, round, and reactive to light.  Cardiovascular:     Rate and Rhythm: Normal rate and regular rhythm.  Pulmonary:     Effort: Pulmonary effort is normal.     Breath sounds: Normal breath sounds.  Neurological:     Mental Status: She is alert.      Impression and Plan:  Viral URI  Head congestion -     POCT Influenza A/B -     POC COVID-19 BinaxNow  Body aches -     POCT Influenza A/B -     POC COVID-19 BinaxNow   - In office flu and COVID test are negative. -Given exam findings, PNA, pharyngitis, ear infection are not likely, hence abx have not been prescribed. -Have advised rest, fluids, OTC antihistamines, cough suppressants and mucinex. -RTC if no improvement in 10-14  days.   Time spent:22 minutes reviewing chart, interviewing and examining patient and formulating plan of care.     [name], MD  [facility]     [1]  Allergies Allergen Reactions   Farxiga  [Dapagliflozin ] Other (See Comments)    Vag yeast inf   Metformin  And Related Diarrhea   Semaglutide (0.25 Or 0.5mg -Dos) Nausea And Vomiting   Statins Other (See Comments)    Muscle weakness/pains   Augmentin  [Amoxicillin -Pot Clavulanate] Other (See Comments)    Patient states she recently reported to dentist and was advised to report to providers as allergy.  States she got blisters under her eyes after taking Augmentin .   Lisinopril  Cough   Other Other (See Comments)  [2]  Current Outpatient Medications:    albuterol  (VENTOLIN  HFA) 108 (90 Base) MCG/ACT inhaler, Inhale 2 puffs into the lungs every 6 (six) hours as needed for wheezing or shortness of breath, Disp: 6.7 g, Rfl: 3   ALPRAZolam  (XANAX ) 1 MG tablet, Take 1 tablet (1 mg) by mouth 3 times daily as needed for anxiety., Disp: 90 tablet, Rfl: 1   Blood Glucose Monitoring Suppl (ONETOUCH VERIO FLEX SYSTEM) w/Device KIT, Use daily as instructed., Disp: 1 kit, Rfl: 0   buPROPion  (WELLBUTRIN  XL) 150 MG 24 hr tablet, Take 1 tablet (150 mg total) by mouth daily., Disp: 90 tablet, Rfl: 0   ezetimibe  (ZETIA ) 10 MG tablet, Take 1 tablet (10 mg total) by mouth daily., Disp: 90 tablet, Rfl: 1   glucose blood (CONTOUR NEXT TEST) test strip, Use to test blood sugar daily as directed, Disp: 100 each, Rfl: 3   glucose blood (ONETOUCH VERIO) test strip, Use 1 time daily as instructed, Disp: 100 each, Rfl: 3   Lancets (ONETOUCH DELICA PLUS LANCET33G) MISC, Use one lancet daily., Disp: 100 each, Rfl: 3   levothyroxine  (SYNTHROID ) 100 MCG tablet, Take 1 tablet (100 mcg total) by mouth every morning., Disp: 90 tablet, Rfl: 3   levothyroxine  (SYNTHROID ) 88 MCG tablet, Take 1 tablet (88 mcg total) by mouth daily., Disp:  90 tablet, Rfl: 3   lidocaine  (XYLOCAINE ) 2 % solution, Use as directed 15 mLs in the mouth or throat as needed for mouth pain., Disp: 100 mL, Rfl: 0   Microlet Lancets MISC, Use to  test blood sugar daily as directed, Disp: 100 each, Rfl: 3   pravastatin  (PRAVACHOL ) 20 MG tablet, Take 1 tablet (20 mg total) by mouth daily., Disp: 90 tablet, Rfl: 1   pravastatin  (PRAVACHOL ) 20 MG tablet, Take 1 tablet (20 mg total) by mouth daily., Disp: 90 tablet, Rfl: 1   sertraline  (ZOLOFT ) 100 MG tablet, Take 1 tablet (100 mg total) by mouth daily., Disp: 90 tablet, Rfl: 0   tirzepatide  (MOUNJARO ) 7.5 MG/0.5ML Pen, Inject 7.5 mg into the skin once a week., Disp: 6 mL, Rfl: 1   tirzepatide  (MOUNJARO ) 7.5 MG/0.5ML Pen, Inject 0.5 mL (7.5 mg total) under the skin every 7 days., Disp: 6 mL, Rfl: 1   tirzepatide  (MOUNJARO ) 7.5 MG/0.5ML Pen, Inject 0.5 mL (7.5 mg total) under the skin every 7 days., Disp: 6 mL, Rfl: 1  "

## 2024-02-13 ENCOUNTER — Other Ambulatory Visit (HOSPITAL_COMMUNITY): Payer: Self-pay

## 2024-02-14 ENCOUNTER — Ambulatory Visit: Payer: No Typology Code available for payment source | Admitting: Obstetrics and Gynecology

## 2024-02-29 ENCOUNTER — Other Ambulatory Visit (HOSPITAL_COMMUNITY): Payer: Self-pay

## 2024-02-29 ENCOUNTER — Other Ambulatory Visit: Payer: Self-pay

## 2024-03-18 ENCOUNTER — Encounter: Admitting: Internal Medicine
